# Patient Record
Sex: Female | Born: 1965 | Race: White | Hispanic: No | Marital: Single | Smoking: Former smoker
Health system: Southern US, Community
[De-identification: ages and names within clinical notes are randomized; demographics above are authoritative.]

## PROBLEM LIST (undated history)

## (undated) DIAGNOSIS — E559 Vitamin D deficiency, unspecified: Secondary | ICD-10-CM

## (undated) DIAGNOSIS — Z5189 Encounter for other specified aftercare: Secondary | ICD-10-CM

## (undated) DIAGNOSIS — K7581 Nonalcoholic steatohepatitis (NASH): Secondary | ICD-10-CM

## (undated) DIAGNOSIS — F329 Major depressive disorder, single episode, unspecified: Secondary | ICD-10-CM

## (undated) DIAGNOSIS — E039 Hypothyroidism, unspecified: Secondary | ICD-10-CM

## (undated) DIAGNOSIS — T7840XA Allergy, unspecified, initial encounter: Secondary | ICD-10-CM

## (undated) DIAGNOSIS — G4730 Sleep apnea, unspecified: Secondary | ICD-10-CM

## (undated) DIAGNOSIS — J45909 Unspecified asthma, uncomplicated: Secondary | ICD-10-CM

## (undated) DIAGNOSIS — R7401 Elevation of levels of liver transaminase levels: Secondary | ICD-10-CM

## (undated) DIAGNOSIS — T8859XA Other complications of anesthesia, initial encounter: Secondary | ICD-10-CM

## (undated) DIAGNOSIS — F32A Depression, unspecified: Secondary | ICD-10-CM

## (undated) DIAGNOSIS — N959 Unspecified menopausal and perimenopausal disorder: Secondary | ICD-10-CM

## (undated) DIAGNOSIS — I10 Essential (primary) hypertension: Secondary | ICD-10-CM

## (undated) DIAGNOSIS — E669 Obesity, unspecified: Secondary | ICD-10-CM

## (undated) DIAGNOSIS — E66811 Obesity, class 1: Secondary | ICD-10-CM

## (undated) DIAGNOSIS — E785 Hyperlipidemia, unspecified: Secondary | ICD-10-CM

## (undated) DIAGNOSIS — K635 Polyp of colon: Secondary | ICD-10-CM

## (undated) DIAGNOSIS — K219 Gastro-esophageal reflux disease without esophagitis: Secondary | ICD-10-CM

## (undated) DIAGNOSIS — E119 Type 2 diabetes mellitus without complications: Secondary | ICD-10-CM

## (undated) DIAGNOSIS — IMO0001 Reserved for inherently not codable concepts without codable children: Secondary | ICD-10-CM

## (undated) DIAGNOSIS — E876 Hypokalemia: Secondary | ICD-10-CM

## (undated) DIAGNOSIS — M545 Low back pain, unspecified: Secondary | ICD-10-CM

## (undated) DIAGNOSIS — G4733 Obstructive sleep apnea (adult) (pediatric): Secondary | ICD-10-CM

## (undated) DIAGNOSIS — E1169 Type 2 diabetes mellitus with other specified complication: Secondary | ICD-10-CM

## (undated) DIAGNOSIS — I152 Hypertension secondary to endocrine disorders: Secondary | ICD-10-CM

## (undated) DIAGNOSIS — M47814 Spondylosis without myelopathy or radiculopathy, thoracic region: Secondary | ICD-10-CM

## (undated) DIAGNOSIS — N3000 Acute cystitis without hematuria: Secondary | ICD-10-CM

## (undated) DIAGNOSIS — F3340 Major depressive disorder, recurrent, in remission, unspecified: Secondary | ICD-10-CM

## (undated) DIAGNOSIS — M791 Myalgia, unspecified site: Secondary | ICD-10-CM

## (undated) DIAGNOSIS — E1159 Type 2 diabetes mellitus with other circulatory complications: Secondary | ICD-10-CM

## (undated) DIAGNOSIS — E034 Atrophy of thyroid (acquired): Secondary | ICD-10-CM

## (undated) DIAGNOSIS — F419 Anxiety disorder, unspecified: Secondary | ICD-10-CM

## (undated) DIAGNOSIS — E0500 Thyrotoxicosis with diffuse goiter without thyrotoxic crisis or storm: Secondary | ICD-10-CM

## (undated) HISTORY — PX: APPENDECTOMY: SHX54

## (undated) HISTORY — DX: Obesity, class 1: E66.811

## (undated) HISTORY — DX: Depression, unspecified: F32.A

## (undated) HISTORY — PX: UPPER GASTROINTESTINAL ENDOSCOPY: SHX188

## (undated) HISTORY — PX: CHOLECYSTECTOMY: SHX55

## (undated) HISTORY — DX: Elevation of levels of liver transaminase levels: R74.01

## (undated) HISTORY — DX: Obesity, unspecified: E66.9

## (undated) HISTORY — PX: TONSILLECTOMY: SUR1361

## (undated) HISTORY — DX: Allergy, unspecified, initial encounter: T78.40XA

## (undated) HISTORY — PX: HYSTERECTOMY ABDOMINAL WITH SALPINGECTOMY: SHX6725

## (undated) HISTORY — DX: Hyperlipidemia, unspecified: E78.5

## (undated) HISTORY — PX: BREAST REDUCTION SURGERY: SHX8

## (undated) HISTORY — DX: Hypothyroidism, unspecified: E03.9

## (undated) HISTORY — DX: Essential (primary) hypertension: I10

## (undated) HISTORY — DX: Encounter for other specified aftercare: Z51.89

## (undated) HISTORY — DX: Sleep apnea, unspecified: G47.30

## (undated) HISTORY — PX: ADRENALECTOMY: SHX876

## (undated) HISTORY — DX: Polyp of colon: K63.5

## (undated) HISTORY — DX: Gastro-esophageal reflux disease without esophagitis: K21.9

## (undated) HISTORY — DX: Nonalcoholic steatohepatitis (NASH): K75.81

## (undated) HISTORY — DX: Vitamin D deficiency, unspecified: E55.9

## (undated) HISTORY — PX: LIVER BIOPSY: SHX301

## (undated) HISTORY — DX: Unspecified asthma, uncomplicated: J45.909

## (undated) HISTORY — DX: Type 2 diabetes mellitus without complications: E11.9

## (undated) HISTORY — DX: Unspecified menopausal and perimenopausal disorder: N95.9

## (undated) MED ORDER — LISINOPRIL 5 MG TAB
5 mg | ORAL_TABLET | Freq: Every day | ORAL | Status: DC
Start: ? — End: 2011-12-25

## (undated) MED ORDER — METFORMIN 500 MG TAB
500 mg | ORAL_TABLET | Freq: Two times a day (BID) | ORAL | Status: DC
Start: ? — End: 2011-12-25

## (undated) MED ORDER — TRIMETHOPRIM-SULFAMETHOXAZOLE 160 MG-800 MG TAB
160-800 mg | ORAL_TABLET | Freq: Two times a day (BID) | ORAL | Status: DC
Start: ? — End: 2011-10-02

## (undated) MED ORDER — ATORVASTATIN 40 MG TAB
40 mg | ORAL_TABLET | Freq: Every day | ORAL | Status: DC
Start: ? — End: 2013-01-20

## (undated) MED ORDER — CIPROFLOXACIN 500 MG TAB
500 mg | ORAL_TABLET | Freq: Three times a day (TID) | ORAL | Status: AC
Start: ? — End: 2012-06-13

## (undated) MED ORDER — ATORVASTATIN 40 MG TAB
40 mg | ORAL_TABLET | Freq: Every day | ORAL | Status: DC
Start: ? — End: 2013-07-31

## (undated) MED ORDER — CYCLOBENZAPRINE 10 MG TAB
10 mg | ORAL_TABLET | Freq: Three times a day (TID) | ORAL | Status: AC | PRN
Start: ? — End: 2013-01-10

## (undated) MED ORDER — CYCLOBENZAPRINE 10 MG TAB
10 mg | ORAL_TABLET | Freq: Three times a day (TID) | ORAL | Status: DC | PRN
Start: ? — End: 2012-12-11

## (undated) MED ORDER — LEVOTHYROXINE 88 MCG TAB
88 mcg | ORAL_TABLET | Freq: Every day | ORAL | Status: DC
Start: ? — End: 2012-03-10

## (undated) MED ORDER — LEVOTHYROXINE 88 MCG TAB
88 mcg | ORAL_TABLET | Freq: Every day | ORAL | Status: DC
Start: ? — End: 2012-03-11

## (undated) MED ORDER — TRIMETHOPRIM-SULFAMETHOXAZOLE 160 MG-800 MG TAB
160-800 mg | ORAL_TABLET | Freq: Two times a day (BID) | ORAL | Status: AC
Start: ? — End: 2011-10-05

## (undated) MED ORDER — CIPROFLOXACIN 500 MG TAB
500 mg | ORAL_TABLET | Freq: Two times a day (BID) | ORAL | Status: AC
Start: ? — End: 2012-06-16

## (undated) MED ORDER — CIPROFLOXACIN 500 MG TAB
500 mg | ORAL_TABLET | Freq: Three times a day (TID) | ORAL | Status: DC
Start: ? — End: 2012-06-06

## (undated) MED ORDER — ATORVASTATIN 40 MG TAB
40 mg | ORAL_TABLET | Freq: Every day | ORAL | Status: DC
Start: ? — End: 2012-11-26

---
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
## 2004-11-14 LAB — HM MAMMOGRAPHY: Mammography, External: NEGATIVE

## 2007-03-05 LAB — URINALYSIS W/ REFLEX CULTURE
Bacteria: NEGATIVE /HPF
Bilirubin: NEGATIVE
Blood: NEGATIVE
Glucose: NEGATIVE MG/DL
Ketone: NEGATIVE MG/DL
Leukocyte Esterase: NEGATIVE
Nitrites: NEGATIVE
Protein: NEGATIVE MG/DL
Specific gravity: 1.011 (ref 1.003–1.030)
Urobilinogen: 0.2 EU/DL (ref 0.2–1.0)
pH (UA): 7 (ref 5.0–8.0)

## 2007-03-06 LAB — MICROALBUMIN, UR, RAND W/ MICROALB/CREAT RATIO
Creatinine, urine random: 38.9 MG/DL (ref 30–125)
Microalbumin,urine random: 1.58 MG/DL
Microalbumin/Creat ratio (mg/g creat): 41 mg/g — ABNORMAL HIGH (ref 0–30)

## 2007-04-15 LAB — METABOLIC PANEL, COMPREHENSIVE
A-G Ratio: 1.4 (ref 1.1–2.2)
ALT (SGPT): 38 U/L (ref 30–65)
AST (SGOT): 15 U/L (ref 15–37)
Albumin: 3.9 g/dL (ref 3.5–5.0)
Alk. phosphatase: 80 U/L (ref 50–136)
Anion gap: 8 mmol/L (ref 5–15)
BUN/Creatinine ratio: 20 (ref 12–20)
BUN: 12 MG/DL (ref 6–20)
Bilirubin, total: 0.4 MG/DL (ref <1.0)
CO2: 30 MMOL/L (ref 21–32)
Calcium: 9.1 MG/DL (ref 8.5–10.1)
Chloride: 101 MMOL/L (ref 97–108)
Creatinine: 0.6 MG/DL (ref 0.6–1.3)
GFR est AA: >60 mL/min/{1.73_m2} (ref >60)
GFR est non-AA: >60 mL/min/{1.73_m2} (ref >60)
Globulin: 2.8 g/dL (ref 2.0–4.0)
Glucose: 95 MG/DL (ref 50–100)
Potassium: 3.7 MMOL/L (ref 3.5–5.1)
Protein, total: 6.7 g/dL (ref 6.4–8.2)
Sodium: 139 MMOL/L (ref 136–145)

## 2007-04-15 LAB — LIPID PANEL
CHOL/HDL Ratio: 3.9 (ref 0–5.0)
Cholesterol, total: 171 MG/DL (ref <200)
HDL Cholesterol: 44 MG/DL (ref 40–60)
LDL, calculated: 103.8 MG/DL — ABNORMAL HIGH (ref 0–100)
Triglyceride: 116 MG/DL (ref 30–200)
VLDL, calculated: 23.2 MG/DL

## 2007-04-16 LAB — HEMOGLOBIN A1C WITH EAG: Hemoglobin A1c: 5.9 % — ABNORMAL HIGH (ref 4.2–5.8)

## 2007-07-16 LAB — METABOLIC PANEL, BASIC
Anion gap: 14 mmol/L (ref 5–15)
BUN/Creatinine ratio: 18 (ref 12–20)
BUN: 11 mg/dL (ref 6–20)
CO2: 28 MMOL/L (ref 21–32)
Calcium: 9.6 MG/DL (ref 8.5–10.1)
Chloride: 101 MMOL/L (ref 97–108)
Creatinine: 0.6 mg/dL (ref 0.6–1.3)
GFR est AA: >60 mL/min/{1.73_m2} (ref >60)
GFR est non-AA: >60 mL/min/{1.73_m2} (ref >60)
Glucose: 83 MG/DL (ref 50–100)
Potassium: 4.2 MMOL/L (ref 3.5–5.1)
Sodium: 143 MMOL/L (ref 136–145)

## 2007-07-16 LAB — HEMOGLOBIN A1C WITH EAG: Hemoglobin A1c: 5.9 % — ABNORMAL HIGH (ref 4.2–5.8)

## 2007-07-16 LAB — TSH 3RD GENERATION: TSH: 1.07 u[IU]/mL (ref 0.35–5.5)

## 2007-07-25 DIAGNOSIS — K760 Fatty (change of) liver, not elsewhere classified: Secondary | ICD-10-CM | POA: Insufficient documentation

## 2007-12-19 LAB — URINALYSIS W/MICROSCOPIC
Bacteria: NEGATIVE /HPF
Bilirubin: NEGATIVE
Blood: NEGATIVE
Glucose: NEGATIVE MG/DL
Ketone: NEGATIVE MG/DL
Nitrites: NEGATIVE
Protein: NEGATIVE MG/DL
Specific gravity: 1.025 (ref 1.003–1.030)
Urobilinogen: 1 EU/DL (ref 0.2–1.0)
pH (UA): 6.5 (ref 5.0–8.0)

## 2007-12-19 LAB — METABOLIC PANEL, BASIC
Anion gap: 5 mmol/L (ref 5–15)
BUN/Creatinine ratio: 25 — ABNORMAL HIGH (ref 12–20)
BUN: 15 MG/DL (ref 6–20)
CO2: 30 MMOL/L (ref 21–32)
Calcium: 9.5 MG/DL (ref 8.5–10.1)
Chloride: 106 MMOL/L (ref 97–108)
Creatinine: 0.6 MG/DL (ref 0.6–1.3)
GFR est AA: >60 mL/min/{1.73_m2} (ref >60)
GFR est non-AA: >60 mL/min/{1.73_m2} (ref >60)
Glucose: 105 MG/DL — ABNORMAL HIGH (ref 50–100)
Potassium: 4.6 MMOL/L (ref 3.5–5.1)
Sodium: 141 MMOL/L (ref 136–145)

## 2007-12-19 LAB — MICROALBUMIN, UR, RAND W/ MICROALB/CREAT RATIO
Creatinine, urine random: 115.9 MG/DL (ref 30.0–125.0)
Microalbumin,urine random: 2.47 MG/DL
Microalbumin/Creat ratio (mg/g creat): 21 mg/g (ref 0–30)

## 2007-12-19 MED ORDER — ZOLPIDEM 10 MG TAB
10 mg | ORAL_TABLET | Freq: Every evening | ORAL | Status: DC | PRN
Start: 2007-12-19 — End: 2008-07-13

## 2007-12-19 NOTE — Progress Notes (Signed)
Katrina [name] is a 42 y.o. female here for a follow up visit.  She was previously followed by Dr. Darcella [name].    1.  Hypertension:  The patient has been compliant with medications.  There have been no known side effects.  The patient denies chest pain, dyspnea, lower extremity swelling.  Her blood pressure at night is usually 127-130s/80s.  She usually goes to the gym, but not recently.      2.  Hyperlipidemia:  She has not wanted to add a statin despite the fact that her cholesterol has been elevated in the past.  She did see Dr. Rose [name] for her fatty liver disease in November.  Her total cholesterol was 170 at the time.      3.  Depression:  She has been on Prozac, Wellbutrin, Paxil in the past.  The Lexapro at 20 mg has worked for quite some time.  She does notice some increased emotionality just before her period, but otherwise is doing better.        4.  Hypothyroidism:  The patient reports compliance with medication.  No hot/cold intolerance.  No change in bowel habits.  No palpitations or fatigue.  TSH was checked in July.      5.  Diabetes:  The patient's blood sugars at home have been very good; she hasn't checked recently.  No hyper- or hypoglycemic symptoms.  No numbness or tingling.  Reports compliance with diet and medications.         Current outpatient prescriptions   Medication Sig   ??? escitalopram (LEXAPRO) 20 mg tablet take 20 mg by mouth daily.   ??? metformin (GLUCOPHAGE) 500 mg tablet take  by mouth two (2) times daily (with meals). 2 tabs bid    ??? levothyroxine (LEVOXYL) 88 mcg tablet take  by mouth daily.   ??? LANSOPRAZOLE (PREVACID PO) take  by mouth.   ??? ursodiol (ACTIGALL) 300 mg capsule take 300 mg by mouth two (2) times a day. Two tabs bid    ??? NEBIVOLOL HCL (BYSTOLIC PO) take 10 mg by mouth daily.   ??? DOCOSAHEXANOIC ACID/EPA (FISH OIL PO) take 1,000 mg by mouth two (2) times a day. 2 tabs bid   ??? PV W-O CAL/FERROUS FUMARATE/FA (M-VIT PO) take  by mouth.    ??? FERROUS FUMARATE/VIT BCOMP&C (SUPER B COMPLEX PO) take  by mouth.   ??? hydrochlorothiazide (HYDRODIURIL) 25 mg tablet take 25 mg by mouth daily.        Lotrel 10/40 mg one po daily.        Allergies   Allergen Reactions   ??? Codeine Rash and Itching         Physical exam:    BP 134/84   Pulse 76   Ht 5\' 5"  (1.651 m)   Wt 192 lb 3.2 oz (87.181 kg)    Gen:  The patient is well-developed, well-nourished, and in no distress.  Heart:  Regular rhythm, normal rate, no murmur, gallop or rub noted  Lungs:  Clear to ascultation bilaterally, without wheezes or rales.  Full, symmetric expansion bilaterally.  Vascular:  Distal pulses 2+ and symmetric bilateral upper and lower extremities.  No peripheral edema noted      Results for Katrina [name], Katrina [name] (MRN [id number]) as of 12/19/2007 10:07   Ref. Range 07/16/2007 09:50   Sodium Latest Range: 136-145 MMOL/L 143   Potassium Latest Range: 3.5-5.1 MMOL/L 4.2   Chloride Latest Range: 97-108 MMOL/L 101   CO2 Latest Range: 21-32  MMOL/L 28   Anion gap Latest Range: 5-15 mmol/L 14   Glucose Latest Range: 50-100 MG/DL 83   BUN Latest Range: 6-20 mg/dL 11   Creatinine Latest Range: 0.6-1.3 mg/dL 0.6   BUN/Creatinine ratio Latest Range: 12-20   18   Calcium Latest Range: 8.5-10.1 MG/DL 9.6   GFR est AA Latest Range: >60 ml/min/1.49m2 >60   GFR est non-AA Latest Range: >60 ml/min/1.14m2 >60   Hemoglobin A1C Latest Range: 4.2-5.8 % 5.9 (H)   TSH, 3rd generation Latest Range: 0.35-5.5 UIU/ML 1.07         Results for Katrina [name], Katrina [name] (MRN [id number]) as of 12/19/2007 10:07   Ref. Range 04/15/2007 09:25   Cholesterol, total Latest Range: <200 MG/DL 536   HDL Cholesterol Latest Range: 40-60 MG/DL 44   CHOL/HDL Ratio Latest Range: 0-5.0   3.9   LDL, calculated Latest Range: 0-100 MG/DL 644.0 (H)   VLDL, calculated No range found 23.2       Assessment and Plan:    - Diabetes mellitus--Previously very well controlled.  Continue diabetic diet, metformin.  - Hemoglobin a1c -- Future   - Microalbumin, ur, rand -- Future  - Urinalysis w/microscopic -- Future  - Continue ACEI    - Hyperlipidemia--Improved by patient report.   - She will have her labs sent from Dr. Luna [name] office.  - Continue lifestyle modifications.    - Hypothyroidism--Clinically euthyroid.  - Continue current dose Levoxyl.    - Essential hypertension, benign--blood pressure is very close go goal.  - Metabolic panel, basic -- Future  - Urinalysis w/microscopic -- Future  - Continue current medications  - Patient is asked to monitor BP at home or work, several times per month and return with written values at next office visit.   - Repeat blood pressure in three months.    - Depression   - Stable.  Continue Lexapro.    - Fatty liver--Followed by Dr. 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  Continue Lexapro.    - Zolpidem 10 mg tab -- Take 1 Tab by mouth nightly as needed for Sleep.    Follow-up Disposition:  Return in about 3 months (around 03/18/2008).

## 2007-12-19 NOTE — Patient Instructions (Signed)
Patient is asked to monitor BP at home or work, several times per month and return with written values at next office visit.

## 2007-12-19 NOTE — Progress Notes (Signed)
Quick Note:    Please call the patient: Her urine looks like it may be infected--if symptoms, we should call in an antibiotic--nitrofurantoin 100 mg po bid x 5 days, #10, no refills. Her urine looks okay, as far as protein. Other labs look good. (A1c is pending--we will send a letter.)    ______

## 2007-12-20 LAB — HEMOGLOBIN A1C WITH EAG: Hemoglobin A1c: 5.8 % (ref 4.2–5.8)

## 2007-12-23 NOTE — Progress Notes (Signed)
Quick Note:    Spoke with patient and called abx to u/k. [phone] also mailed copies of labs to patient per her request  ______

## 2008-03-19 ENCOUNTER — Ambulatory Visit

## 2008-03-19 LAB — METABOLIC PANEL, BASIC
Anion gap: 7 mmol/L (ref 5–15)
BUN/Creatinine ratio: 24 — ABNORMAL HIGH (ref 12–20)
BUN: 17 MG/DL (ref 6–20)
CO2: 32 MMOL/L (ref 21–32)
Calcium: 9.4 MG/DL (ref 8.5–10.1)
Chloride: 104 MMOL/L (ref 97–108)
Creatinine: 0.7 MG/DL (ref 0.6–1.3)
GFR est AA: >60 mL/min/{1.73_m2} (ref >60)
GFR est non-AA: >60 mL/min/{1.73_m2} (ref >60)
Glucose: 101 MG/DL — ABNORMAL HIGH (ref 50–100)
Potassium: 3.2 MMOL/L — ABNORMAL LOW (ref 3.5–5.1)
Sodium: 143 MMOL/L (ref 136–145)

## 2008-03-19 LAB — HEMOGLOBIN A1C WITH EAG: Hemoglobin A1c: 5.9 % — ABNORMAL HIGH (ref 4.2–5.8)

## 2008-03-19 MED ORDER — ALPRAZOLAM 0.5 MG TAB
0.5 mg | ORAL_TABLET | Freq: Every evening | ORAL | Status: DC | PRN
Start: 2008-03-19 — End: 2008-05-10

## 2008-03-19 NOTE — Telephone Encounter (Signed)
S/w pt and relayed message as per Dr. Maurine Minister.  Advised pt I will be sending her list of high potassium foods.  Pt acknowledged understanding of all.

## 2008-03-19 NOTE — Patient Instructions (Signed)
Thank you for enrolling in MyChart. Please follow the instructions below to securely access your online medical record. MyChart allows you to send messages to your doctor, view your test results, renew your prescriptions, schedule appointments, and more.    How Do I Sign Up?    1. In your internet browser, go to [email or web address].  2. Click on the Sign Up Now link in the Sign In box. You will see the New Member Sign Up page.  3. Enter your MyChart Access Code exactly as it appears below. You will not need to use this code after you???ve completed the sign-up process. If you do not sign up before the expiration date, you must request a new code.    MyChart Access Code: [email or web address]  Expires: 05/18/08 09:38 AM     4. Enter the last four digits of your Social Security Number (xxxx) and Date of Birth (mm/dd/yyyy) as indicated and click Submit. You will be taken to the next sign-up page.  5. Create a MyChart ID. This will be your MyChart login ID and cannot be changed, so think of one that is secure and easy to remember.  6. Create a MyChart password. You can change your password at any time.  7. Enter your Password Reset Question and Answer. This can be used at a later time if you forget your password.   8. Enter your e-mail address. You will receive e-mail notification when new information is available in MyChart.  9. Click Sign Up. You can now view your medical record.    Additional Information    If you have questions, you can email [email or web address]. Remember, MyChart is NOT to be used for urgent needs. For medical emergencies, dial 911.      Talk to Dr. Rose Fillers before starting alprazolam.  And about getting lipid panel sent here.

## 2008-03-19 NOTE — Telephone Encounter (Signed)
Message copied by Fernanda Drum on Fri Mar 19, 2008  3:25 PM  ------       Message from: Shirlean Schlein, D       Created: Fri Mar 19, 2008  1:42 PM         Please call the patient: Her potassium is slightly low today.  She should eat high potassium foods and have it re-checked in a week or two.  ( please mail her a potassium diet).

## 2008-03-19 NOTE — Progress Notes (Signed)
Med check/refills.

## 2008-03-19 NOTE — Progress Notes (Signed)
HISTORY OF PRESENT ILLNESS  Katrina [name] is a 43 y.o. female here for a follow up visit.    HPI 1. Hypertension:  The patient has been compliant with medications.  There have been no known side effects.  The patient denies chest pain, dyspnea.  Her blood pressures have been around 127-130/82-90 at home.  Her blood pressures seem to be at the upper end of the range first thing in the morning, before he medicine.  She is exercising, running and doing pilates.        2.  Diabetes:  The patient's blood sugars at home have been good.  No hyper- or hypoglycemic symptoms.  No numbness or tingling.  Reports compliance with diet and medications.  Her labs are below.    3.  Depression:  She does not take her Ambien every night, and on the nights she doesn't take it she can't relax, can't stop thinking.  She feels her depression is controlled with Lexapro, except right before her period.  She is more on edge, emotional at that time. Stress at work is high right now.      Current outpatient prescriptions   Medication Sig   ??? amlodipine-benazepril (LOTREL) 10-40 mg per capsule Take 1 Cap by mouth daily.   ??? zolpidem (AMBIEN) 10 mg tablet Take 1 Tab by mouth nightly as needed for Sleep.   ??? escitalopram (LEXAPRO) 20 mg tablet take 20 mg by mouth daily.   ??? metformin (GLUCOPHAGE) 500 mg tablet take  by mouth two (2) times daily (with meals). 2 tabs bid    ??? levothyroxine (LEVOXYL) 88 mcg tablet take  by mouth daily.   ??? LANSOPRAZOLE (PREVACID PO) take  by mouth.   ??? ursodiol (ACTIGALL) 300 mg capsule take 300 mg by mouth two (2) times a day. Two tabs bid    ??? NEBIVOLOL HCL (BYSTOLIC PO) take 10 mg by mouth daily.   ??? DOCOSAHEXANOIC ACID/EPA (FISH OIL PO) take 1,000 mg by mouth two (2) times a day. 2 tabs bid   ??? PV W-O CAL/FERROUS FUMARATE/FA (M-VIT PO) take  by mouth.   ??? FERROUS FUMARATE/VIT BCOMP&C (SUPER B COMPLEX PO) take  by mouth.   ??? hydrochlorothiazide (HYDRODIURIL) 25 mg tablet take 25 mg by mouth daily.           Allergies   Allergen Reactions   ??? Codeine Rash and Itching          ROS see HPI      Filed Vitals:    03/19/2008  9:07 AM 03/19/2008  9:36 AM   BP: 160/90 160/82   Pulse: 92    Weight: 195 lb (88.451 kg)           Physical Exam   Nursing note and vitals reviewed.  Constitutional: She appears well-developed and well-nourished. No distress.   Cardiovascular: Normal rate, regular rhythm, normal heart sounds and intact distal pulses.  Exam reveals no gallop and no friction rub.    No murmur heard.  Pulses:       Radial pulses are 2+ on the right side, and 2+ on the left side.        Dorsalis pedis pulses are 2+ on the right side, and 2+ on the left side.        Posterior tibial pulses are 2+ on the right side, and 2+ on the left side.        No peripheral edema noted   Pulmonary/Chest: Effort normal and  breath sounds normal. She has no wheezes. She has no rales.         Results for Katrina [name], Katrina [name] (MRN [id number]) as of 03/19/2008 09:26   Ref. Range 12/19/2007 10:47   Color No range found YELLOW   Appearance No range found CLEAR   pH Latest Range: 5.0-8.0   6.5   Protein Latest Range: NEGATIVE MG/DL NEGATIVE   Glucose Latest Range: NEGATIVE MG/DL NEGATIVE   Ketone Latest Range: NEGATIVE MG/DL NEGATIVE   Blood Latest Range: NEGATIVE  NEGATIVE   Bilirubin Latest Range: NEGATIVE  NEGATIVE   Urobilinogen Latest Range: 0.2-1.0 EU/DL 1.0   Nitrites Latest Range: NEGATIVE  NEGATIVE   Leukocyte Esterase Latest Range: NEGATIVE  MODERATE (A)   WBC Latest Range: 0-4 /HPF 10-20   RBC Latest Range: 0-5 /HPF 0-3   Bacteria Latest Range: NEGATIVE /HPF NEGATIVE   Hyaline Cast Latest Range: 0-2  0-2   Epithelial cells Latest Range: 0-5 /LPF 5-10   Sodium Latest Range: 136-145 MMOL/L 141   Potassium Latest Range: 3.5-5.1 MMOL/L 4.6   Chloride Latest Range: 97-108 MMOL/L 106   CO2 Latest Range: 21-32 MMOL/L 30   Anion gap Latest Range: 5-15 mmol/L 5   Glucose Latest Range: 50-100 MG/DL 045 (H)   BUN Latest Range: 6-20 MG/DL 15    Creatinine Latest Range: 0.6-1.3 MG/DL 0.6   BUN/Creatinine ratio Latest Range: 12-20   25 (H)   Calcium Latest Range: 8.5-10.1 MG/DL 9.5   GFR est AA Latest Range: >60 ml/min/1.82m2 >60   GFR est non-AA Latest Range: >60 ml/min/1.40m2 >60   Hemoglobin A1C Latest Range: 4.2-5.8 % 5.8   Creatinine,urine random Latest Range: 30.0-125.0 MG/DL 409.8   Microalbumin,urine random No range found 2.47   Microalbumin/Creat ratio (mg/g creat) Latest Range: 0-30 mg/g 21         ASSESSMENT and PLAN    Essential hypertension, benign--Her blood pressure has been controlled before.  Her elevated blood pressures in the morning may suggest she has a non-dipping pattern of her blood pressure.  Her medication may be wearing off by this time as well.  I suspect her blood pressure has been increased because of anxiety, though.  Will treat her anxiety, as below, and have her follow up.  She was asked to monitor her blood pressures at home, as well.  - METABOLIC PANEL, BASIC; Future  - [facility] MYCHART BP FLOWSHEET    Diabetes mellitus--Her A1C is at goal. I still do not know her cholesterol values.  She will continue a diabetic diet and metformin.  She will be given a lab sheet for repeat A1c today.  She will call Dr. Luna Kitchens office again to have her recent lipid panel faxed. Continue benazepril.   - HEMOGLOBIN A1C; Future  - METABOLIC PANEL, BASIC; Future    Elevated triglycerides with high cholesterol--Not on medication.  She says these values were just checked.  Again, we will review the records from Dr. Rose Fillers.      Anxiety--Her depression is controlled with Lexapro, but she has some recent anxiety symptoms.  I suspect she will do well with some alprazolam 0.5 mg prn for a short time.  She will ask Dr. Rose Fillers if it is safe with regard to her liver function first.   - alprazolam Prudy Feeler) tablet; Take 1 Tab by mouth nightly as needed for Sleep and Anxiety.        ? Cholesterol

## 2008-03-19 NOTE — Progress Notes (Signed)
Quick Note:    Please call the patient: Her potassium is slightly low today. She should eat high potassium foods and have it re-checked in a week or two. ( please mail her a potassium diet).  ______

## 2008-03-28 ENCOUNTER — Encounter

## 2008-03-31 ENCOUNTER — Ambulatory Visit

## 2008-03-31 LAB — POTASSIUM: Potassium: 4 MMOL/L (ref 3.5–5.1)

## 2008-03-31 NOTE — Progress Notes (Signed)
Quick Note:    Please call the patient: your potassium is normal now. It was probably just a transient change, or lab error. Please follow up in about a month to discuss your blood pressure and anxiety.  ______

## 2008-03-31 NOTE — Telephone Encounter (Signed)
Spoke with patient

## 2008-03-31 NOTE — Telephone Encounter (Signed)
Message copied by Denton Meek on Wed Mar 31, 2008 12:47 PM  ------       Message from: Shirlean Schlein, D       Created: Wed Mar 31, 2008 11:34 AM         Please call the patient: your potassium is normal now. It was probably just a transient change, or lab error.  Please follow up in about a month to discuss your blood pressure and anxiety.

## 2008-03-31 NOTE — Telephone Encounter (Signed)
Message copied by Denton Meek on Wed Mar 31, 2008 12:48 PM  ------       Message from: Shirlean Schlein, D       Created: Wed Mar 31, 2008 11:34 AM         Please call the patient: your potassium is normal now. It was probably just a transient change, or lab error.  Please follow up in about a month to discuss your blood pressure and anxiety.

## 2008-04-02 MED ORDER — ESCITALOPRAM 20 MG TAB
20 mg | ORAL_TABLET | Freq: Every day | ORAL | Status: DC
Start: 2008-04-02 — End: 2008-11-02

## 2008-05-10 MED ORDER — ALPRAZOLAM 0.5 MG TAB
0.5 mg | ORAL_TABLET | Freq: Every evening | ORAL | Status: DC | PRN
Start: 2008-05-10 — End: 2008-07-13

## 2008-05-10 MED ORDER — ALPRAZOLAM 0.5 MG TAB
0.5 mg | ORAL_TABLET | Freq: Every evening | ORAL | Status: DC | PRN
Start: 2008-05-10 — End: 2008-11-02

## 2008-05-10 NOTE — Progress Notes (Signed)
HISTORY OF PRESENT ILLNESS  Katrina [name] is a 43 y.o. female here for a follow up visit.     HPI 1.  Hypertension:  The patient has been compliant with medications.  There have been no known side effects.  Her blood pressure at home has been much lower--she has even gotten some that are low 100s over 60.  The patient denies chest pain, dyspnea, lower extremity swelling; there has been some dizziness with quick position changes.  She has been exercising four days per week and has lost eight pounds.      2.  Anxiety:  She has stopped taking her Ambien.  She noticed the Xanax at bedtime relaxes her immediately. She is able to get to sleep and sleep through the night. During the day she even feels better.  She reports compliance with her Lexapro.         Current outpatient prescriptions   Medication Sig   ??? escitalopram (LEXAPRO) 20 mg tablet Take 1 Tab by mouth daily.   ??? alprazolam (XANAX) 0.5 mg tablet Take 1 Tab by mouth nightly as needed for Sleep and Anxiety.   ??? amlodipine-benazepril (LOTREL) 10-40 mg per capsule Take 1 Cap by mouth daily.   ??? zolpidem (AMBIEN) 10 mg tablet Take 1 Tab by mouth nightly as needed for Sleep.   ??? metformin (GLUCOPHAGE) 500 mg tablet take  by mouth two (2) times daily (with meals). 2 tabs bid    ??? levothyroxine (LEVOXYL) 88 mcg tablet take  by mouth daily.   ??? LANSOPRAZOLE (PREVACID PO) take  by mouth.   ??? ursodiol (ACTIGALL) 300 mg capsule take 300 mg by mouth two (2) times a day. Two tabs bid    ??? NEBIVOLOL HCL (BYSTOLIC PO) take 10 mg by mouth daily.   ??? DOCOSAHEXANOIC ACID/EPA (FISH OIL PO) take 1,000 mg by mouth two (2) times a day. 2 tabs bid   ??? PV W-O CAL/FERROUS FUMARATE/FA (M-VIT PO) take  by mouth.   ??? FERROUS FUMARATE/VIT BCOMP&C (SUPER B COMPLEX PO) take  by mouth.   ??? hydrochlorothiazide (HYDRODIURIL) 25 mg tablet take 25 mg by mouth daily.        Allergies   Allergen Reactions   ??? Codeine Rash and Itching          ROS see HPI     BP 122/80   Pulse 58   Wt 188 lb 12.8 oz (85.639 kg)     Physical Exam   Nursing note and vitals reviewed.  Constitutional: She appears well-developed and well-nourished. No distress.   Cardiovascular: Normal rate, regular rhythm, normal heart sounds and intact distal pulses.  Exam reveals no gallop and no friction rub.    No murmur heard.  Pulses:       Radial pulses are 2+ on the right side, and 2+ on the left side.        Dorsalis pedis pulses are 2+ on the right side, and 2+ on the left side.        Posterior tibial pulses are 2+ on the right side, and 2+ on the left side.        No peripheral edema noted   Pulmonary/Chest: Effort normal and breath sounds normal. She has no wheezes. She has no rales.   Psychiatric: She has a normal mood and affect. Her behavior is normal. Thought content normal.       ASSESSMENT and PLAN      Essential hypertension, benign--Improved. She  has still had some blood pressures above goal for diabetes, but it has greatly improved with treating her anxiety. I will ask her to stay on her current medications and continue monitoring her blood pressures at home.  As she loses weight and continues exercising, she may require less medication for control.   - MyChart BP Flowsheet    Anxiety--Improved. She will continue her Lexapro and alprazolam at bedtime.    - alprazolam Prudy Feeler) tablet; Take 1 Tab by mouth nightly as needed for Sleep and Anxiety.      Follow-up Disposition:  Return in about 2 months (around 07/10/2008). for diabetes, lipids, blood pressure.

## 2008-07-13 ENCOUNTER — Ambulatory Visit

## 2008-07-13 ENCOUNTER — Encounter

## 2008-07-13 LAB — TSH 3RD GENERATION: TSH: 1.07 u[IU]/mL (ref 0.36–3.74)

## 2008-07-13 LAB — POTASSIUM: Potassium: 3.2 MMOL/L — ABNORMAL LOW (ref 3.5–5.1)

## 2008-07-13 MED ORDER — NEBIVOLOL 10 MG TAB
10 mg | ORAL_TABLET | Freq: Every day | ORAL | Status: DC
Start: 2008-07-13 — End: 2008-11-02

## 2008-07-13 MED ORDER — HYDROCHLOROTHIAZIDE 25 MG TAB
25 mg | ORAL_TABLET | Freq: Every day | ORAL | Status: DC
Start: 2008-07-13 — End: 2008-11-02

## 2008-07-13 MED ORDER — POTASSIUM CHLORIDE SR 20 MEQ TAB, PARTICLES/CRYSTALS
20 mEq | ORAL_TABLET | Freq: Every day | ORAL | Status: DC
Start: 2008-07-13 — End: 2009-03-04

## 2008-07-13 MED ORDER — LEVOTHYROXINE 88 MCG TAB
88 mcg | ORAL_TABLET | Freq: Every day | ORAL | Status: DC
Start: 2008-07-13 — End: 2008-11-02

## 2008-07-13 MED ORDER — METFORMIN 500 MG TAB
500 mg | ORAL_TABLET | Freq: Two times a day (BID) | ORAL | Status: DC
Start: 2008-07-13 — End: 2008-11-02

## 2008-07-13 MED ORDER — AMLODIPINE-BENAZEPRIL 10 MG-40 MG CAP
10-40 mg | ORAL_CAPSULE | Freq: Every day | ORAL | Status: DC
Start: 2008-07-13 — End: 2008-11-02

## 2008-07-13 NOTE — Patient Instructions (Signed)
Follow up me in three months--let me know if blood pressure is high before then

## 2008-07-13 NOTE — Progress Notes (Signed)
HISTORY OF PRESENT ILLNESS  Katrina [name] is a 43 y.o. female here for a follow up visit.     HPI 1. Diabetes:  The patient's blood sugars at home have been  No hyper- or hypoglycemic symptoms.  No numbness or tingling.  Reports compliance with diet and medications.  Her A1C in June was 5.7%.    2.  Hypertension:  The patient has been compliant with medications.  There have been no known side effects.  The patient denies chest pain, dyspnea, lower extremity swelling.  Her blood pressures have been 130 systolic at home.      3.  Hypothyroidism:  The patient reports compliance with medication.  No hot/cold intolerance.  No change in bowel habits.  No palpitations or fatigue.     4. Hyperlipidemia:  The patient has been working on weight loss, but admits her diet has not been very good lately.  She cut down on calories, but did not have a good balance of protein and carbohydrates that she had before.  She just had her cholesterol checked in June with Dr. Rose Fillers. LDL was 117, TGs 160, total was 185, HDL was 36.  She has never been on a statin, but before this, her LDL and overall cholesterol was better.    5.  Depression:  The alprazolam 1/2 or one pill at bedtime has helped with her sleep.  Her mood is good.      Current outpatient prescriptions   Medication Sig   ??? alprazolam (XANAX) 0.5 mg tablet Take 1 Tab by mouth nightly as needed for Sleep and Anxiety.   ??? escitalopram (LEXAPRO) 20 mg tablet Take 1 Tab by mouth daily.   ??? amlodipine-benazepril (LOTREL) 10-40 mg per capsule Take 1 Cap by mouth daily.   ??? metformin (GLUCOPHAGE) 500 mg tablet take  by mouth two (2) times daily (with meals). 2 tabs bid    ??? levothyroxine (LEVOXYL) 88 mcg tablet take  by mouth daily.   ??? ursodiol (ACTIGALL) 300 mg capsule take 300 mg by mouth two (2) times a day. Two tabs bid    ??? NEBIVOLOL HCL (BYSTOLIC PO) take 10 mg by mouth daily.    ??? DOCOSAHEXANOIC ACID/EPA (FISH OIL PO) take 1,000 mg by mouth two (2) times a day. 2 tabs bid   ??? PV W-O CAL/FERROUS FUMARATE/FA (M-VIT PO) take  by mouth.   ??? FERROUS FUMARATE/VIT BCOMP&C (SUPER B COMPLEX PO) take  by mouth.   ??? hydrochlorothiazide (HYDRODIURIL) 25 mg tablet take 25 mg by mouth daily.        Allergies   Allergen Reactions   ??? Codeine Rash and Itching          ROS see HPI      Filed Vitals:    07/13/2008  8:51 AM 07/13/2008  9:15 AM   BP: 138/90 140/72   Pulse: 62    Weight: 180 lb 12.8 oz (82.01 kg)           Physical Exam   Nursing note and vitals reviewed.  Constitutional: She is oriented to person, place, and time. She appears well-developed and well-nourished. No distress.   Cardiovascular: Normal rate, regular rhythm, normal heart sounds and intact distal pulses.  Exam reveals no gallop and no friction rub.    No murmur heard.  Pulses:       Radial pulses are 2+ on the right side, and 2+ on the left side.  Dorsalis pedis pulses are 2+ on the right side, and 2+ on the left side.        Posterior tibial pulses are 2+ on the right side, and 2+ on the left side.        No peripheral edema noted   Pulmonary/Chest: Effort normal and breath sounds normal. She has no wheezes. She has no rales.   Neurological: She is alert and oriented to person, place, and time.   Psychiatric: She has a normal mood and affect. Her behavior is normal. Thought content normal.       ASSESSMENT and PLAN    Diabetes mellitus--Well controlled, with an A1C of 5.7.  She will continue metformin, ARB.  I will consider starting a statin. Diabetic diet recommended.  Follow up in three months.  - metformin (GLUCOPHAGE) tablet; Take 2 Tabs by mouth two (2) times daily (with meals).     Essential hypertension, benign--Blood pressure controlled at home, and had been better at her last appointment.  I will ask her to continue monitoring and send her readings to me.  I will follow up on her borderline potassium level and consider adding a supplement while on hydrochlorothiazide.  - AMLODIPINE-BENAZEPRIL 10 MG-40 MG CAP; Take 1 Cap by mouth daily.  - hydrochlorothiazide (HYDRODIURIL) tablet; Take 1 Tab by mouth daily.  - nebivolol (BYSTOLIC) tablet; Take 1 Tab by mouth daily.  - POTASSIUM; Future    Hyperlipidemia--Not treated. She will continue exercising and work on her diet.  She has never been on a statin--there was possibly some concern about the fatty liver.  I will insist on a statin if it is still above goal in six months.    Hypothyroidism--Clinically euthyroid.   Will check labs and continue the current dose of medication pending results.   - TSH, 3RD GENERATION; Future  - levothyroxine (SYNTHROID) tablet; Take 1 Tab by mouth daily.    Depression--She is doing better.  Continue current medications.      Follow-up Disposition:  Return in about 3 months (around 10/13/2008).
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
## 2008-08-16 NOTE — Patient Instructions (Signed)
Try the muscle at bedtime, recumbent bike for another week, walking without hills is fine for now.
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
## 2008-11-02 ENCOUNTER — Ambulatory Visit

## 2008-11-02 LAB — METABOLIC PANEL, BASIC
Anion gap: 10 mmol/L (ref 5–15)
BUN/Creatinine ratio: 17 (ref 12–20)
BUN: 10 MG/DL (ref 6–20)
CO2: 28 MMOL/L (ref 21–32)
Calcium: 8.7 MG/DL (ref 8.5–10.1)
Chloride: 105 MMOL/L (ref 97–108)
Creatinine: 0.6 MG/DL (ref 0.6–1.3)
GFR est AA: >60 mL/min/{1.73_m2} (ref >60)
GFR est non-AA: >60 mL/min/{1.73_m2} (ref >60)
Glucose: 95 MG/DL (ref 65–100)
Potassium: 3.6 MMOL/L (ref 3.5–5.1)
Sodium: 143 MMOL/L (ref 136–145)

## 2008-11-02 LAB — HEMOGLOBIN A1C WITH EAG: Hemoglobin A1c: 5.7 % (ref 4.8–6.0)

## 2008-11-02 MED ORDER — NEBIVOLOL 10 MG TAB
10 mg | ORAL_TABLET | Freq: Every day | ORAL | Status: DC
Start: 2008-11-02 — End: 2009-11-08

## 2008-11-02 MED ORDER — ALPRAZOLAM 0.5 MG TAB
0.5 mg | ORAL_TABLET | Freq: Every evening | ORAL | Status: DC | PRN
Start: 2008-11-02 — End: 2009-12-28

## 2008-11-02 MED ORDER — METFORMIN 500 MG TAB
500 mg | ORAL_TABLET | Freq: Two times a day (BID) | ORAL | Status: DC
Start: 2008-11-02 — End: 2010-03-29

## 2008-11-02 MED ORDER — LEVOTHYROXINE 88 MCG TAB
88 mcg | ORAL_TABLET | Freq: Every day | ORAL | Status: DC
Start: 2008-11-02 — End: 2010-02-08

## 2008-11-02 MED ORDER — AMLODIPINE-BENAZEPRIL 10 MG-40 MG CAP
10-40 mg | ORAL_CAPSULE | Freq: Every day | ORAL | Status: DC
Start: 2008-11-02 — End: 2009-12-12

## 2008-11-02 MED ORDER — HYDROCHLOROTHIAZIDE 25 MG TAB
25 mg | ORAL_TABLET | Freq: Every day | ORAL | Status: DC
Start: 2008-11-02 — End: 2009-03-04

## 2008-11-02 MED ORDER — ESCITALOPRAM 20 MG TAB
20 mg | ORAL_TABLET | Freq: Every day | ORAL | Status: DC
Start: 2008-11-02 — End: 2009-03-02

## 2008-11-02 NOTE — Progress Notes (Signed)
HISTORY OF PRESENT ILLNESS  Katrina [name] is a 43 y.o. female here for a follow up visit.     HPI 1. Diabetes:  The patient's blood sugars at home have not been checked.  No hyper- or hypoglycemic symptoms.  No numbness or tingling.  Reports compliance with diet and medications.  She had an eye exam this year, her flu shot was last week at work.      2. Hypertension:  The patient has been compliant with medications.  There have been no known side effects.  The patient denies chest pain, dyspnea, lower extremity swelling.      3. Hyperlipidemia:  The patient has never been on medications.   Her LDL was above goal in July.  She was advised to improve her diet.    4.   Depression:  She has been doing well, her mood is good. She is taking 1 alprazolam to sleep through the night. She increased it with stress at work recently, but will cut it in half again. She does need refills of this and Lexapro today.        Current outpatient prescriptions   Medication Sig   ??? vitamin E (AQUA GEMS) 400 unit capsule Take 400 Units by mouth two (2) times a day.   ??? amlodipine-benazepril (LOTREL) 10-40 mg per capsule Take 1 Cap by mouth daily.   ??? levothyroxine (LEVOXYL) 88 mcg tablet Take 1 Tab by mouth daily.   ??? metformin (GLUCOPHAGE) 500 mg tablet Take 2 Tabs by mouth two (2) times daily (with meals).   ??? hydrochlorothiazide (HYDRODIURIL) 25 mg tablet Take 1 Tab by mouth daily.   ??? nebivolol (BYSTOLIC) 10 mg tablet Take 1 Tab by mouth daily.   ??? potassium chloride (K-DUR, KLOR-CON) 20 mEq tablet Take 1 Tab by mouth daily for 360 days.   ??? alprazolam (XANAX) 0.5 mg tablet Take 1 Tab by mouth nightly as needed for Sleep and Anxiety.   ??? escitalopram (LEXAPRO) 20 mg tablet Take 1 Tab by mouth daily.   ??? ursodiol (ACTIGALL) 300 mg capsule take 300 mg by mouth two (2) times a day. Two tabs bid    ??? DOCOSAHEXANOIC ACID/EPA (FISH OIL PO) take 1,000 mg by mouth two (2) times a day. 2 tabs bid    ??? PV W-O CAL/FERROUS FUMARATE/FA (M-VIT PO) take  by mouth.   ??? FERROUS FUMARATE/VIT BCOMP&C (SUPER B COMPLEX PO) take  by mouth.          Allergies   Allergen Reactions   ??? Codeine Rash and Itching        ROS see HPI    Filed Vitals:    11/02/2008  9:37 AM 11/02/2008  9:58 AM   BP: 138/80 130/85   Pulse: 64    Weight: 188 lb 9.6 oz (85.548 kg)         Physical Exam   Nursing note and vitals reviewed.  Constitutional: She appears well-developed and well-nourished. No distress.   Cardiovascular: Normal rate, regular rhythm, normal heart sounds and intact distal pulses.  Exam reveals no gallop and no friction rub.    No murmur heard.  Pulses:       Radial pulses are 2+ on the right side, and 2+ on the left side.        Dorsalis pedis pulses are 2+ on the right side, and 2+ on the left side.        Posterior tibial pulses are 2+ on the  right side, and 2+ on the left side.        No peripheral edema noted   Pulmonary/Chest: Effort normal and breath sounds normal. She has no wheezes. She has no rales.   Neurological: No sensory deficit.   Skin: Skin is warm, dry and intact.        No sores on feet       Results for Katrina [name], Katrina [name] (MRN [id number]) as of 11/02/2008 09:53   Ref. Range 07/13/2008 09:42   TSH, 3rd generation Latest Range: 0.36-3.74 UIU/ML 1.07       ASSESSMENT and PLAN    Diabetes mellitus--Has been very well controlled, with her last A1C of 5.7. Continue diet, metformin, benazpril. She will have labs now.  Pneumovax is up to date, flu shot already performed.  - metformin (GLUCOPHAGE) 500 mg tablet; Take 2 Tabs by mouth two (2) times daily (with meals).  - METABOLIC PANEL, BASIC; Future  - HEMOGLOBIN A1C; Future    Hyperlipidemia--Her LDL was above goal at last check and triglycerides increased. She will be due for lipid panel in three months. Continue diet, exercise.    Depression--Stable. Continue current medications.     Essential hypertension, benign-Very close to goal. Continue current medications, exercise.  - amlodipine-benazepril (LOTREL) 10-40 mg per capsule; Take 1 Cap by mouth daily.  - hydrochlorothiazide (HYDRODIURIL) 25 mg tablet; Take 1 Tab by mouth daily.  - nebivolol (BYSTOLIC) 10 mg tablet; Take 1 Tab by mouth daily.  - METABOLIC PANEL, BASIC; Future    Hypokalemia--BMP today to follow up.        Follow-up Disposition:  Return in about 6 months (around 05/03/2009).

## 2008-11-02 NOTE — Patient Instructions (Signed)
Should return for fasting cholesterol in three months.  No appointment necessary.  Call/send a message requesting the labslip.

## 2009-02-08 ENCOUNTER — Encounter

## 2009-02-12 LAB — METABOLIC PANEL, COMPREHENSIVE
A-G Ratio: 1.9 (ref 1.1–2.5)
ALT (SGPT): 19 IU/L (ref 0–40)
AST (SGOT): 16 IU/L (ref 0–40)
Albumin: 4.2 g/dL (ref 3.5–5.5)
Alk. phosphatase: 62 IU/L (ref 25–150)
BUN/Creatinine ratio: 24 — ABNORMAL HIGH (ref 9–23)
BUN: 14 mg/dL (ref 6–24)
Bilirubin, total: 0.2 mg/dL (ref 0.0–1.2)
CO2: 25 mmol/L (ref 20–32)
Calcium: 9.5 mg/dL (ref 8.7–10.2)
Chloride: 102 mmol/L (ref 97–108)
Creatinine: 0.59 mg/dL (ref 0.57–1.00)
GFR est AA: >59 mL/min/{1.73_m2} (ref >59)
GFR est non-AA: >59 mL/min/{1.73_m2} (ref >59)
GLOBULIN, TOTAL: 2.2 g/dL (ref 1.5–4.5)
Glucose: 77 mg/dL (ref 65–99)
Potassium: 4.2 mmol/L (ref 3.5–5.2)
Protein, total: 6.4 g/dL (ref 6.0–8.5)
Sodium: 140 mmol/L (ref 135–145)

## 2009-02-12 LAB — T4 (THYROXINE): T4, Total: 8.7 ug/dL (ref 4.5–12.0)

## 2009-02-12 LAB — LIPID PANEL WITH LDL/HDL RATIO
Cholesterol, total: 170 mg/dL (ref 100–199)
HDL Cholesterol: 42 mg/dL (ref >39)
LDL, calculated: 112 mg/dL — ABNORMAL HIGH (ref 0–99)
LDL/HDL Ratio: 2.7 ratio units (ref 0.0–3.2)
Triglyceride: 82 mg/dL (ref 0–149)
VLDL, calculated: 16 mg/dL (ref 5–40)

## 2009-02-12 LAB — TSH 3RD GENERATION: TSH: 1.87 u[IU]/mL (ref 0.450–4.500)

## 2009-02-12 LAB — HEMOGLOBIN A1C WITH EAG: Hemoglobin A1c: 6.5 % — ABNORMAL HIGH (ref 4.8–5.6)

## 2009-02-13 NOTE — Progress Notes (Addendum)
Quick Note:    Please call the patient: She should make an appointment to discuss her cholesterol and increased blood sugar average. She should cancer her April appt and make one for this month.  ______

## 2009-02-15 NOTE — Telephone Encounter (Signed)
S/w pt & relayed message as per Dr Maurine Minister.  Pt verbalized understanding of all & will call back to reschedule appt once she gets to work & can see her calendar.

## 2009-02-15 NOTE — Telephone Encounter (Signed)
Message copied by Fernanda Drum on Tue Feb 15, 2009  8:29 AM  ------       Message from: Shirlean Schlein D       Created: Sun Feb 13, 2009 11:40 AM         Please call the patient: She should make an appointment to discuss her cholesterol and increased blood sugar average.  She should cancer her April appt and make one for this month.

## 2009-02-17 MED ORDER — ATORVASTATIN 20 MG TAB
20 mg | ORAL_TABLET | Freq: Every day | ORAL | Status: DC
Start: 2009-02-17 — End: 2009-02-17

## 2009-02-17 MED ORDER — SIMVASTATIN 40 MG TAB
40 mg | ORAL_TABLET | Freq: Every evening | ORAL | Status: DC
Start: 2009-02-17 — End: 2009-04-05

## 2009-02-17 NOTE — Progress Notes (Signed)
HISTORY OF PRESENT ILLNESS  Katrina [name] is a 44 y.o. female here for a follow up visit.    HPI 1. Hyperlipidemia:  The patient has been compliant with medication--fish oil.  No known side effects.  Denies muscle aches. Her labs earlier this month were not much better--LDL was 112, but the TGs were down to 82.    2. Hypertension:  The patient has been compliant with medications.  There have been no known side effects.  Her blood pressure has been 140-150/80-90ss at home in the last couple days.  She began to check because she had some ruptured blood vessels in her eye.  She has not had any problems with exercising.       Current outpatient prescriptions   Medication Sig   ??? ascorbic acid (VITAMIN C) 500 mg tablet Take  by mouth.   ??? vitamin E (AQUA GEMS) 400 unit capsule Take 400 Units by mouth two (2) times a day.   ??? amlodipine-benazepril (LOTREL) 10-40 mg per capsule Take 1 Cap by mouth daily.   ??? levothyroxine (LEVOXYL) 88 mcg tablet Take 1 Tab by mouth daily.   ??? metformin (GLUCOPHAGE) 500 mg tablet Take 2 Tabs by mouth two (2) times daily (with meals).   ??? hydrochlorothiazide (HYDRODIURIL) 25 mg tablet Take 1 Tab by mouth daily.   ??? nebivolol (BYSTOLIC) 10 mg tablet Take 1 Tab by mouth daily.   ??? escitalopram (LEXAPRO) 20 mg tablet Take 1 Tab by mouth daily.   ??? alprazolam (XANAX) 0.5 mg tablet Take 1 Tab by mouth nightly as needed for Sleep and Anxiety.   ??? potassium chloride (K-DUR, KLOR-CON) 20 mEq tablet Take 1 Tab by mouth daily for 360 days.   ??? DOCOSAHEXANOIC ACID/EPA (FISH OIL PO) take 1,000 mg by mouth two (2) times a day. 2 tabs bid   ??? PV W-O CAL/FERROUS FUMARATE/FA (M-VIT PO) take  by mouth.   ??? FERROUS FUMARATE/VIT BCOMP&C (SUPER B COMPLEX PO) take  by mouth.          ROS see HPI    Filed Vitals:    02/17/09 0950 02/17/09 1008   BP: 160/92 160/88   Pulse: 68    Height: 5\' 5"  (1.651 m)    Weight: 191 lb 3.2 oz (86.728 kg)           Physical Exam   Vitals reviewed.   Constitutional: She appears well-developed and well-nourished. No distress.   Cardiovascular: Normal rate, regular rhythm, normal heart sounds and intact distal pulses.  Exam reveals no gallop and no friction rub.    No murmur heard.  Pulses:       Radial pulses are 2+ on the right side, and 2+ on the left side.        Dorsalis pedis pulses are 2+ on the right side, and 2+ on the left side.        Posterior tibial pulses are 2+ on the right side, and 2+ on the left side.        No peripheral edema noted   Pulmonary/Chest: Effort normal and breath sounds normal. She has no wheezes. She has no rales.       ASSESSMENT and PLAN    Hyperlipidemia--Improved, but her LDL is still above goal for a diabetic patient. She will begin simvastatin, and repeat her labs in two months.  - LIPID PANEL WITH LDL/HDL RATIO  - simvastatin (ZOCOR) 40 mg tablet; Take 1 Tab by mouth nightly.  Essential hypertension, benign--Above goal. She has been at goal at her most recent appointments. I will have her return in two weeks for blood pressure check. If her blood pressure is above goal, I may consider adding spironolactone and stopping hydrochlorothiazide and potassium.  She was encouraged to continue diet and exercise.    Encounter for long-term (current) use of other medications--Labs ordered today in anticipation of her follow up. To be drawn just before the appointment.   - ALT  - AST      Follow-up Disposition:  Return in about 2 weeks (around 03/03/2009) for blood pressure.

## 2009-02-21 NOTE — Telephone Encounter (Signed)
Message copied by Joetta Manners on Mon Feb 21, 2009  3:52 PM  ------       Message from: Shirlean Schlein D       Created: Thu Feb 17, 2009  4:31 PM       Regarding: tdd         No need to continue fish oil.              ----- Message -----          From: Denton Meek          Sent: 02/17/2009   4:23 PM            To: Maricela Curet, MD       Subject: Annell Greening: tdd                med question                                      ----- Message -----          From: Su Hoff          Sent: 02/17/2009   2:18 PM            To: [facility]       Subject: tdd                med question                            [phone] pt wants to know if she should continue taking the fish oil.

## 2009-02-21 NOTE — Telephone Encounter (Signed)
Patient advised to stop fish oil per order of Dr Maurine Minister.

## 2009-03-02 MED ORDER — ESCITALOPRAM 20 MG TAB
20 mg | ORAL_TABLET | Freq: Every day | ORAL | Status: DC
Start: 2009-03-02 — End: 2010-03-29

## 2009-03-04 MED ORDER — SPIRONOLACTONE 25 MG TAB
25 mg | ORAL_TABLET | Freq: Every day | ORAL | Status: DC
Start: 2009-03-04 — End: 2009-04-05

## 2009-03-04 NOTE — Progress Notes (Signed)
HISTORY OF PRESENT ILLNESS  Katrina [name] is a 44 y.o. female here for a follow up visit.     HPI 1. Hypertension:  The patient has been compliant with medications.  There have been no known side effects.  The patient denies chest pain, dyspnea, lower extremity swelling. She was hypertensive at her last appointment, is here today to follow up.  She had a sleep study in 2005, that was negative.  She does feel tired all the time. Her blood pressures have been around 140-150/90s in the morning and 130-140/80 at night.      Current outpatient prescriptions   Medication Sig   ??? lansoprazole (PREVACID SOLUTAB) 30 mg disintegrating tablet    ??? escitalopram (LEXAPRO) 20 mg tablet Take 1 Tab by mouth daily.   ??? ascorbic acid (VITAMIN C) 500 mg tablet Take  by mouth.   ??? simvastatin (ZOCOR) 40 mg tablet Take 1 Tab by mouth nightly.   ??? vitamin E (AQUA GEMS) 400 unit capsule Take 400 Units by mouth two (2) times a day.   ??? amlodipine-benazepril (LOTREL) 10-40 mg per capsule Take 1 Cap by mouth daily.   ??? levothyroxine (LEVOXYL) 88 mcg tablet Take 1 Tab by mouth daily.   ??? metformin (GLUCOPHAGE) 500 mg tablet Take 2 Tabs by mouth two (2) times daily (with meals).   ??? hydrochlorothiazide (HYDRODIURIL) 25 mg tablet Take 1 Tab by mouth daily.   ??? nebivolol (BYSTOLIC) 10 mg tablet Take 1 Tab by mouth daily.   ??? alprazolam (XANAX) 0.5 mg tablet Take 1 Tab by mouth nightly as needed for Sleep and Anxiety.   ??? potassium chloride (K-DUR, KLOR-CON) 20 mEq tablet Take 1 Tab by mouth daily for 360 days.   ??? PV W-O CAL/FERROUS FUMARATE/FA (M-VIT PO) take  by mouth.   ??? FERROUS FUMARATE/VIT BCOMP&C (SUPER B COMPLEX PO) take  by mouth.          Allergies   Allergen Reactions   ??? Codeine Rash and Itching          ROS see HPI        Physical Exam   Vitals reviewed.  Constitutional: She appears well-developed and well-nourished. No distress.    Cardiovascular: Normal rate, regular rhythm, normal heart sounds and intact distal pulses.  Exam reveals no gallop and no friction rub.    No murmur heard.  Pulses:       Radial pulses are 2+ on the right side, and 2+ on the left side.        Dorsalis pedis pulses are 2+ on the right side, and 2+ on the left side.        Posterior tibial pulses are 2+ on the right side, and 2+ on the left side.        No peripheral edema noted   Pulmonary/Chest: Effort normal and breath sounds normal. She has no wheezes. She has no rales.       Results for ALEK, PONCEDELEON (MRN [id number]) as of 03/04/2009 09:09   Ref. Range 02/11/2009 11:20   Sodium Latest Range: 135-145 mmol/L 140   Potassium Latest Range: 3.5-5.2 mmol/L 4.2   Chloride Latest Range: 97-108 mmol/L 102   CO2 Latest Range: 20-32 mmol/L 25   Glucose Latest Range: 65-99 mg/dL 77   BUN Latest Range: 6-24 mg/dL 14   Creatinine Latest Range: 0.57-1.00 mg/dL 0.45   BUN/Creatinine ratio Latest Range: 9-23   24 (H)   Calcium Latest Range: 8.7-10.2  mg/dL 9.5   GFR est AA Latest Range: >59 mL/min/1.73 >59   GFR est non-AA Latest Range: >59 mL/min/1.73 >59   Bilirubin, total Latest Range: 0.0-1.2 mg/dL 0.2   Protein, total Latest Range: 6.0-8.5 g/dL 6.4   Albumin Latest Range: 3.5-5.5 g/dL 4.2   A-G Ratio Latest Range: 1.1-2.5   1.9   ALT Latest Range: 0-40 IU/L 19   AST Latest Range: 0-40 IU/L 16   Alk. phosphatase Latest Range: 25-150 IU/L 62   Triglyceride Latest Range: 0-149 mg/dL 82   Cholesterol, total Latest Range: 100-199 mg/dL 295   HDL Cholesterol Latest Range: >39 mg/dL 42   LDL, calculated Latest Range: 0-99 mg/dL 621 (H)   LDL/HDL Ratio Latest Range: 0.0-3.2 ratio units 2.7   VLDL, calculated Latest Range: 5-40 mg/dL 16   Hemoglobin H0Q Latest Range: 4.8-5.6 % 6.5 (H)   T4 Latest Range: 4.5-12.0 ug/dL 8.7   TSH, 3rd generation Latest Range: 0.450-4.500 uIU/mL 1.870       ASSESSMENT and PLAN     Essential hypertension, benign--Poor control, but this does not appear to be new on review of her old blood pressures from home. Interestingly, her blood pressure higher in the morning, as if she is a non-dipper or has lost drug effect. I will check her aldosterone and renin today, stop hydrochlorothiazide and potassium and start spironolactone. I will also send her for another sleep evaluation.  - ALDOSTERONE  - ALDOSTERONE/RENIN ACT.RATIO  - spironolactone (ALDACTONE) 25 mg tablet; Take 1 Tab by mouth daily.  - REFERRAL TO SLEEP STUDIES    Follow-up Disposition:  Return in about 1 month (around 04/01/2009) for blood pressure.

## 2009-03-08 LAB — ALDOSTERONE/RENIN ACTIVITY
Aldosterone: 27.4 ng/dL (ref 0.0–30.0)
Renin Activity: <0.15 ng/mL/hr

## 2009-03-09 LAB — ALDOSTERONE: Aldosterone: 31 ng/dL

## 2009-03-14 LAB — AST: AST (SGOT): 19 IU/L (ref 0–40)

## 2009-03-14 LAB — METABOLIC PANEL, BASIC
BUN/Creatinine ratio: 27 — ABNORMAL HIGH (ref 9–23)
BUN: 14 mg/dL (ref 6–24)
CO2: 24 mmol/L (ref 20–32)
Calcium: 9.4 mg/dL (ref 8.7–10.2)
Chloride: 104 mmol/L (ref 97–108)
Creatinine: 0.52 mg/dL — ABNORMAL LOW (ref 0.57–1.00)
GFR est AA: >59 mL/min/{1.73_m2} (ref >59)
GFR est non-AA: >59 mL/min/{1.73_m2} (ref >59)
Glucose: 98 mg/dL (ref 65–99)
Potassium: 4 mmol/L (ref 3.5–5.2)
Sodium: 139 mmol/L (ref 135–145)

## 2009-03-14 LAB — ALT: ALT (SGPT): 22 IU/L (ref 0–40)

## 2009-03-14 LAB — CK: Creatine Kinase,Total: 173 U/L (ref 24–173)

## 2009-03-14 NOTE — Progress Notes (Signed)
HISTORY OF PRESENT ILLNESS  Katrina [name] is a 44 y.o. female here for an acute visit.     HPI 1. Leg pain and swelling: Last week, her swelling got very bad in her feet and ankles at the end of the day. She feels puffy all over, as well. She also feels her legs are throbbing when she is running or walking.  It is as if needles are sticking her. She has been trying to stay active.  She denies dyspnea, chest pain or pressure.  Since starting her spironolactone, her blood pressure has been 150/80-90 consistently in the morning. She has a sleep study on the 23rd. She had also started the simvastatin recently.      Current outpatient prescriptions   Medication Sig   ??? lansoprazole (PREVACID SOLUTAB) 30 mg disintegrating tablet    ??? spironolactone (ALDACTONE) 25 mg tablet Take 1 Tab by mouth daily.   ??? escitalopram (LEXAPRO) 20 mg tablet Take 1 Tab by mouth daily.   ??? ascorbic acid (VITAMIN C) 500 mg tablet Take  by mouth.   ??? simvastatin (ZOCOR) 40 mg tablet Take 1 Tab by mouth nightly.   ??? vitamin E (AQUA GEMS) 400 unit capsule Take 400 Units by mouth two (2) times a day.   ??? amlodipine-benazepril (LOTREL) 10-40 mg per capsule Take 1 Cap by mouth daily.   ??? levothyroxine (LEVOXYL) 88 mcg tablet Take 1 Tab by mouth daily.   ??? metformin (GLUCOPHAGE) 500 mg tablet Take 2 Tabs by mouth two (2) times daily (with meals).   ??? nebivolol (BYSTOLIC) 10 mg tablet Take 1 Tab by mouth daily.   ??? alprazolam (XANAX) 0.5 mg tablet Take 1 Tab by mouth nightly as needed for Sleep and Anxiety.   ??? PV W-O CAL/FERROUS FUMARATE/FA (M-VIT PO) take  by mouth.   ??? FERROUS FUMARATE/VIT BCOMP&C (SUPER B COMPLEX PO) take  by mouth.        Allergies   Allergen Reactions   ??? Codeine Rash and Itching          ROS see HPI    BP 140/88   Pulse 66   Ht 5\' 5"  (1.651 m)   Wt 192 lb 3.2 oz (87.181 kg)        Physical Exam   Vitals reviewed.  Constitutional: She appears well-developed and well-nourished. No distress.    Cardiovascular: Normal rate, regular rhythm, normal heart sounds and intact distal pulses.  Exam reveals no gallop and no friction rub.    No murmur heard.  Pulses:       Radial pulses are 2+ on the right side, and 2+ on the left side.        Dorsalis pedis pulses are 2+ on the right side, and 2+ on the left side.        Posterior tibial pulses are 2+ on the right side, and 2+ on the left side.        No peripheral edema noted   Pulmonary/Chest: Effort normal and breath sounds normal. She has no wheezes. She has no rales.   Musculoskeletal:        No leg tenderness, or cords noted       ASSESSMENT and PLAN      Edema--Though it would be unusual, this could be in response to stopping hydrochlorothiazide and starting spironolactone. I will have her hold the medications until I get her labs results today. She should also have her potassium levels assessed. I encouraged  her to have her sleep study later this month. After her labs, I will decide on diuretic therapy, and blood pressure management.   - METABOLIC PANEL, BASIC    Myalgia--Likely related to the edema, but I will assess for sign that the simvastatin is a cause. She will hold the simvastatin tonight, until she hears from me about continuing or stopping.  - ALT  - AST  - CK    Encounter for long-term (current) use of other medications  - CK        Follow-up Disposition:  Return in about 1 month (around 04/14/2009) for blood pressure, cholesterol.

## 2009-03-31 ENCOUNTER — Encounter

## 2009-04-05 MED ORDER — SIMVASTATIN 40 MG TAB
40 mg | ORAL_TABLET | Freq: Every evening | ORAL | Status: DC
Start: 2009-04-05 — End: 2009-06-08

## 2009-04-05 NOTE — Patient Instructions (Signed)
Take Lotrel in the evening with your simvastatin, then continue the Bystolic, hydrochlorothiazide in the morning.

## 2009-04-05 NOTE — Progress Notes (Signed)
HISTORY OF PRESENT ILLNESS  Katrina [name] is a 44 y.o. female here for a follow up visit.     HPI 1. Hypertension:  The patient has been compliant with medications.  There have been no known side effects.  The patient denies chest pain, dyspnea, lower extremity swelling. Her blood pressures are around 150s/80s first thing in the morning, 130s/80s later in the day. She had her sleep study was 03/30/09, and will follow up for results soon.  Of note, her myalgia, leg discomfort has resolved, even on the simvastatin.         Current outpatient prescriptions   Medication Sig   ??? potassium chloride (K-DUR, KLOR-CON) 20 mEq tablet    ??? hydrochlorothiazide (HYDRODIURIL) 25 mg tablet Take 25 mg by mouth daily.   ??? lansoprazole (PREVACID SOLUTAB) 30 mg disintegrating tablet    ??? escitalopram (LEXAPRO) 20 mg tablet Take 1 Tab by mouth daily.   ??? ascorbic acid (VITAMIN C) 500 mg tablet Take  by mouth.   ??? simvastatin (ZOCOR) 40 mg tablet Take 1 Tab by mouth nightly.   ??? vitamin E (AQUA GEMS) 400 unit capsule Take 400 Units by mouth two (2) times a day.   ??? amlodipine-benazepril (LOTREL) 10-40 mg per capsule Take 1 Cap by mouth daily.   ??? levothyroxine (LEVOXYL) 88 mcg tablet Take 1 Tab by mouth daily.   ??? metformin (GLUCOPHAGE) 500 mg tablet Take 2 Tabs by mouth two (2) times daily (with meals).   ??? nebivolol (BYSTOLIC) 10 mg tablet Take 1 Tab by mouth daily.   ??? alprazolam (XANAX) 0.5 mg tablet Take 1 Tab by mouth nightly as needed for Sleep and Anxiety.   ??? PV W-O CAL/FERROUS FUMARATE/FA (M-VIT PO) take  by mouth.   ??? FERROUS FUMARATE/VIT BCOMP&C (SUPER B COMPLEX PO) take  by mouth.          Allergies   Allergen Reactions   ??? Codeine Rash and Itching          ROS see HPI      Filed Vitals:    04/05/09 1116 04/05/09 1132   BP: 142/84 135/76   Pulse: 68    Resp: 16    Height: 5\' 5"  (1.651 m)    Weight: 193 lb 3.2 oz (87.635 kg)         Physical Exam   Vitals reviewed.   Constitutional: She appears well-developed and well-nourished. No distress.   Cardiovascular: Normal rate, regular rhythm, normal heart sounds and intact distal pulses.  Exam reveals no gallop and no friction rub.    No murmur heard.  Pulses:       Radial pulses are 2+ on the right side, and 2+ on the left side.        Dorsalis pedis pulses are 2+ on the right side, and 2+ on the left side.        Posterior tibial pulses are 2+ on the right side, and 2+ on the left side.        No peripheral edema noted   Pulmonary/Chest: Effort normal and breath sounds normal. She has no wheezes. She has no rales.       ASSESSMENT and PLAN    Essential hypertension, benign--Improved, but still with increased am blood pressures. She probably does not dip down at night, as usual. She will follow up on her sleep study results and recommendations. For now, I would like her to continue her medications, but move her Lotrel  to bedtime.  She will follow up in two months, with labs before. I counseled her about sodium restriction.  - METABOLIC PANEL, BASIC    Hyperlipidemia--Labs ordered today in anticipation of her follow up. To be drawn just before the appointment.   - LIPID PANEL WITH LDL/HDL RATIO  - AST  - ALT  - simvastatin (ZOCOR) 40 mg tablet; Take 1 Tab by mouth nightly.    Diabetes mellitus--Labs ordered today in anticipation of her follow up. To be drawn just before the appointment.   - LIPID PANEL WITH LDL/HDL RATIO  - HEMOGLOBIN A1C  - METABOLIC PANEL, BASIC    Follow-up Disposition:  Return in about 2 months (around 06/05/2009) for blood pressure, diabetes.

## 2009-04-27 NOTE — Telephone Encounter (Signed)
Pt. States her GI Dr instructed her to start iron every day. Informed she can take otc slow fe and if she gets constipated to either take colace or sennakot and to let her DR's know.

## 2009-04-27 NOTE — Telephone Encounter (Signed)
Message copied by Merril Abbe on Wed Apr 27, 2009  2:54 PM  ------       Message from: Luanne Bras       Created: Wed Apr 27, 2009  2:05 PM       Regarding: Non-Urgent Medical Question       Contact: [phone]         gastro doc at [facility] did iron level last friday. mine=3%. told me to start iron supplement.  is there one you recommend?  someone mentioned to me there is an Rx that is pill combining iron and something to counteract constipation.  is there an rx?

## 2009-05-03 MED ORDER — POTASSIUM CHLORIDE SR 20 MEQ TAB, PARTICLES/CRYSTALS
20 mEq | ORAL_TABLET | Freq: Every day | ORAL | Status: DC
Start: 2009-05-03 — End: 2009-06-08

## 2009-05-03 NOTE — Telephone Encounter (Signed)
V.o. Per dr dennis

## 2009-06-01 LAB — LIPID PANEL WITH LDL/HDL RATIO
Cholesterol, total: 146 mg/dL (ref 100–199)
HDL Cholesterol: 48 mg/dL (ref >39)
LDL, calculated: 82 mg/dL (ref 0–99)
LDL/HDL Ratio: 1.7 ratio units (ref 0.0–3.2)
Triglyceride: 81 mg/dL (ref 0–149)
VLDL, calculated: 16 mg/dL (ref 5–40)

## 2009-06-01 LAB — METABOLIC PANEL, BASIC
BUN/Creatinine ratio: 17 (ref 9–23)
BUN: 10 mg/dL (ref 6–24)
CO2: 26 mmol/L (ref 20–32)
Calcium: 9.4 mg/dL (ref 8.7–10.2)
Chloride: 102 mmol/L (ref 97–108)
Creatinine: 0.59 mg/dL (ref 0.57–1.00)
GFR est AA: 131 mL/min/{1.73_m2} (ref >59)
GFR est non-AA: 113 mL/min/{1.73_m2} (ref >59)
Glucose: 89 mg/dL (ref 65–99)
Potassium: 3.2 mmol/L — ABNORMAL LOW (ref 3.5–5.2)
Sodium: 144 mmol/L (ref 135–145)

## 2009-06-01 LAB — HEMOGLOBIN A1C WITH EAG: Hemoglobin A1c: 6.2 % — ABNORMAL HIGH (ref 4.8–5.6)

## 2009-06-01 LAB — ALT: ALT (SGPT): 18 IU/L (ref 0–40)

## 2009-06-01 LAB — AST: AST (SGOT): 19 IU/L (ref 0–40)

## 2009-06-08 ENCOUNTER — Encounter

## 2009-06-08 MED ORDER — POTASSIUM CHLORIDE SR 20 MEQ TAB, PARTICLES/CRYSTALS
20 mEq | ORAL_TABLET | Freq: Every day | ORAL | Status: DC
Start: 2009-06-08 — End: 2009-08-09

## 2009-06-08 MED ORDER — CHLORTHALIDONE 25 MG TAB
25 mg | ORAL_TABLET | Freq: Every day | ORAL | Status: DC
Start: 2009-06-08 — End: 2009-07-14

## 2009-06-08 MED ORDER — ROSUVASTATIN 20 MG TAB
20 mg | ORAL_TABLET | Freq: Every day | ORAL | Status: DC
Start: 2009-06-08 — End: 2009-07-20

## 2009-06-08 NOTE — Progress Notes (Signed)
Pt. Scheduled for renal ultrasound fri 06/10/09 930a [facility], npo 8hrs, full bladder [facility].

## 2009-06-08 NOTE — Progress Notes (Signed)
HISTORY OF PRESENT ILLNESS  Katrina [name] is a 44 y.o. female here for a follow up visit.       HPI 1. Diabetes:  The patient's blood sugars at home have not been checked lately. No hyper- or hypoglycemic symptoms. She has no numbness or tingling.  Reports compliance with diet and medications.  She is not taking aspirin daily.    2. Hypertension:  The patient has been compliant with medications, moved her Lotrel to bedtime in March. Her blood pressures were at 130-140s/80-90s mostly until this month, when they shot up.  She has been getting 150-180/90-100 consistently with her machine at home.  The patient does note some chest pain at night with lying down, no associated dyspnea or exertional pain, lower extremity swelling has come up at times since March, and she has had throbbing in her legs with walking. With her machine in the office today, she gets 172/96.     3. Hyperlipidemia:  The patient has been compliant with medicatios.  No known side effects.  Denies muscle aches. She had advanced diagnostic labs at [facility] in April.    4. GERD:  She is doing well.  She denies weight loss, melena, dysphagia, heartburn. She does have the chest pain sometimes at night, but cannot related it to her diet. She was started on iron by Dr. Rose Fillers for low levels and hemoglobin of 11.2 in April.    5. Depression: She has been anxious about her blood pressure, but otherwise says her mood is good. She is sleeping well.         Current outpatient prescriptions   Medication Sig   ??? potassium chloride (K-DUR, KLOR-CON) 20 mEq tablet Take 1 Tab by mouth daily.   ??? hydrochlorothiazide (HYDRODIURIL) 25 mg tablet Take 25 mg by mouth daily.   ??? simvastatin (ZOCOR) 40 mg tablet Take 1 Tab by mouth nightly.   ??? lansoprazole (PREVACID SOLUTAB) 30 mg disintegrating tablet    ??? escitalopram (LEXAPRO) 20 mg tablet Take 1 Tab by mouth daily.   ??? ascorbic acid (VITAMIN C) 500 mg tablet Take  by mouth.    ??? vitamin E (AQUA GEMS) 400 unit capsule Take 400 Units by mouth two (2) times a day.   ??? amlodipine-benazepril (LOTREL) 10-40 mg per capsule Take 1 Cap by mouth daily.   ??? levothyroxine (LEVOXYL) 88 mcg tablet Take 1 Tab by mouth daily.   ??? metformin (GLUCOPHAGE) 500 mg tablet Take 2 Tabs by mouth two (2) times daily (with meals).   ??? nebivolol (BYSTOLIC) 10 mg tablet Take 1 Tab by mouth daily.   ??? alprazolam (XANAX) 0.5 mg tablet Take 1 Tab by mouth nightly as needed for Sleep and Anxiety.   ??? PV W-O CAL/FERROUS FUMARATE/FA (M-VIT PO) take  by mouth.   ??? FERROUS FUMARATE/VIT BCOMP&C (SUPER B COMPLEX PO) take  by mouth.        Allergies   Allergen Reactions   ??? Codeine Rash and Itching          ROS see HPI    Filed Vitals:    06/08/09 0945 06/08/09 1005   BP: 142/88 170/95   Pulse: 68    Height: 5\' 5"  (1.651 m)    Weight: 195 lb (88.451 kg)           Physical Exam   Vitals reviewed.  Constitutional: She is oriented to person, place, and time. She appears well-developed and well-nourished. No distress.   Cardiovascular: Normal  rate, regular rhythm, normal heart sounds and intact distal pulses.  Exam reveals no gallop and no friction rub.    No murmur heard.  Pulses:       Radial pulses are 2+ on the right side, and 2+ on the left side.        Dorsalis pedis pulses are 2+ on the right side, and 2+ on the left side.        Posterior tibial pulses are 2+ on the right side, and 2+ on the left side.        No peripheral edema noted   Pulmonary/Chest: Effort normal and breath sounds normal. She has no wheezes. She has no rales.   Neurological: She is alert and oriented to person, place, and time. No cranial nerve deficit.   Skin: Skin is warm, dry and intact.   Psychiatric: She has a normal mood and affect. Her behavior is normal. Thought content normal.       EKG:  Normal sinus rhythm with normal axis, intervals.  No ST-T changes.         Results for MATTISEN, POHLMANN (MRN [id number]) as of 06/08/2009 12:53    Ref. Range 05/31/2009 09:24   Sodium Latest Range: 135-145 mmol/L 144   Potassium Latest Range: 3.5-5.2 mmol/L 3.2 (L)   Chloride Latest Range: 97-108 mmol/L 102   CO2 Latest Range: 20-32 mmol/L 26   Glucose Latest Range: 65-99 mg/dL 89   BUN Latest Range: 6-24 mg/dL 10   Creatinine Latest Range: 0.57-1.00 mg/dL 0.45   BUN/Creatinine ratio Latest Range: 9-23  17   Calcium Latest Range: 8.7-10.2 mg/dL 9.4   GFR est AA Latest Range: >59 mL/min/1.73 131   GFR est non-AA Latest Range: >59 mL/min/1.73 113   ALT Latest Range: 0-40 IU/L 18   AST Latest Range: 0-40 IU/L 19   Triglyceride Latest Range: 0-149 mg/dL 81   Cholesterol, total Latest Range: 100-199 mg/dL 409   HDL Cholesterol Latest Range: >39 mg/dL 48   LDL, calculated Latest Range: 0-99 mg/dL 82   LDL/HDL Ratio Latest Range: 0.0-3.2 ratio units 1.7   VLDL, calculated Latest Range: 5-40 mg/dL 16   Hemoglobin W1X Latest Range: 4.8-5.6 % 6.2 (H)       ASSESSMENT and PLAN    Essential hypertension, benign--She has had a sudden worsening of her daily blood pressures, with constant sodium restriction, exercise, medications. I would like to rule out adrenal adenoma, cortisol excess. She should continue to watch sodium and I will switch to a more effective diuretic--chlorthalidone--while increasing her potassium. She will have a renal ultrasound as well. If not improving in two weeks, I will refer her to nephrology.  - US RENAL COMPLETE; Future  - chlorthalidone (HYGROTEN) 25 mg tablet; Take 1 Tab by mouth daily.  - potassium chloride (K-DUR, KLOR-CON) 20 mEq tablet; Take 1 Tab by mouth daily.  - CORTISOL, AM  - POTASSIUM    Chest pain--Atypical. It may be reflux, due to the new iron supplement or related to her blood pressure. No specific changes noted today. I will follow up on this again in two weeks.  - AMB POC EKG ROUTINE W/ 12 LEADS, INTER & REP     Diabetes mellitus--Very well-controlled. A1C is 6.2%. She will continue to monitor her diet, continue metformin, ACE inhibitor and statin, begin taking aspirin 81 mg daily. Pneumovax is up to date.    Hyperlipidemia--I reviewed her labs from April and the ones from earlier this month. Her  LDL and particle numbers could be improved. She is high risk and reports improved liver disease. I suggest she change from simvastatin to Crestor and repeat lipids in two months.  - rosuvastatin (CRESTOR) 20 mg tablet; Take 1 Tab by mouth daily.    Ge reflux--Stable, though I wonder if this is causing some of her chest pain. I will have her continue Prevacid, watch diet, follow up in two weeks.    Depression--Stable. Continue Lexapro.    Encounter for long-term (current) use of other medications  - POTASSIUM        Follow-up Disposition:  Return in about 2 weeks (around 06/22/2009) for blood pressure.

## 2009-06-09 ENCOUNTER — Encounter

## 2009-06-09 NOTE — Telephone Encounter (Signed)
Pt. Notified the renal ultrasound was cancelled at [facility] and a renal doppler is scheduled for tues 06/14/09 830a [facility]. Dx htn to assess flow.

## 2009-06-14 NOTE — Progress Notes (Signed)
See scanned report.

## 2009-06-15 LAB — CORTISOL, AM: Cortisol, a.m.: 8.1 ug/dL (ref 6.2–19.4)

## 2009-06-15 LAB — POTASSIUM: Potassium: 3.7 mmol/L (ref 3.5–5.2)

## 2009-06-15 NOTE — Telephone Encounter (Signed)
Spoke to pt about her renal ultrasound was negative. Reminded to have labs done prior to follow up visit. Pt states she has had them done.

## 2009-06-23 NOTE — Progress Notes (Signed)
HISTORY OF PRESENT ILLNESS  Katrina [name] is a 44 y.o. female here for a follow up visit.     HPI 1. Hypertension:  The patient has been compliant with medications.  Her blood pressures have been 150s-170/90s mostly at home. She had 130s and 140s on two occasions in the evening. The blood pressures are always low after exercising; she feels good when it is low.  There have been no known side effects to the chlorthalidone.  The patient denies chest pain, dyspnea, lower extremity swelling.        Current outpatient prescriptions   Medication Sig   ??? FERROUS SULFATE (SLOW FE PO) Take  by mouth.   ??? docusate sodium (COLACE) 100 mg capsule Take 100 mg by mouth two (2) times a day.   ??? chlorthalidone (HYGROTEN) 25 mg tablet Take 1 Tab by mouth daily.   ??? potassium chloride (K-DUR, KLOR-CON) 20 mEq tablet Take 1 Tab by mouth daily.   ??? rosuvastatin (CRESTOR) 20 mg tablet Take 1 Tab by mouth daily.   ??? lansoprazole (PREVACID SOLUTAB) 30 mg disintegrating tablet    ??? escitalopram (LEXAPRO) 20 mg tablet Take 1 Tab by mouth daily.   ??? ascorbic acid (VITAMIN C) 500 mg tablet Take  by mouth.   ??? vitamin E (AQUA GEMS) 400 unit capsule Take 400 Units by mouth two (2) times a day.   ??? amlodipine-benazepril (LOTREL) 10-40 mg per capsule Take 1 Cap by mouth daily.   ??? levothyroxine (LEVOXYL) 88 mcg tablet Take 1 Tab by mouth daily.   ??? metformin (GLUCOPHAGE) 500 mg tablet Take 2 Tabs by mouth two (2) times daily (with meals).   ??? nebivolol (BYSTOLIC) 10 mg tablet Take 1 Tab by mouth daily.   ??? alprazolam (XANAX) 0.5 mg tablet Take 1 Tab by mouth nightly as needed for Sleep and Anxiety.   ??? PV W-O CAL/FERROUS FUMARATE/FA (M-VIT PO) take  by mouth.   ??? FERROUS FUMARATE/VIT BCOMP&C (SUPER B COMPLEX PO) take  by mouth.          Allergies   Allergen Reactions   ??? Codeine Rash and Itching   ??? Aldactone (Spironolactone) Swelling          ROS see HPI    Filed Vitals:    06/23/09 0905 06/23/09 0915   BP: 142/90 148/85   Pulse: 64     Height: 5\' 5"  (1.651 m)    Weight: 189 lb 9.6 oz (86.002 kg)           Physical Exam   Vitals reviewed.  Constitutional: She appears well-developed and well-nourished. No distress.   Cardiovascular: Normal rate, regular rhythm, normal heart sounds and intact distal pulses.  Exam reveals no gallop and no friction rub.    No murmur heard.  Pulses:       Radial pulses are 2+ on the right side, and 2+ on the left side.        Dorsalis pedis pulses are 2+ on the right side, and 2+ on the left side.        Posterior tibial pulses are 2+ on the right side, and 2+ on the left side.        No peripheral edema noted   Pulmonary/Chest: Effort normal and breath sounds normal. She has no wheezes. She has no rales.       ASSESSMENT and PLAN    Essential hypertension, benign--Poor control, non-dipping blood pressures at night. Her renal artery ultrasound  was normal. She still has significantly increased blood pressures at home, only low after exercise. She has no chest pain, but should have her EF, septum evaluated and blood pressure monitored with exercise. I will also refer her to nephrology for assistance with her blood pressure. Continue current medications for now.  - ECHO TTE STRESS EXRCSE COMP; Future  - REFERRAL TO NEPHROLOGY      Follow-up Disposition:  Return in about 2 months (around 08/23/2009) for diabetes.

## 2009-06-23 NOTE — Progress Notes (Signed)
Pt. Scheduled for stress echo 06/28/09 230p CVA and appt, 07/01/09 145p Dr Dorian Heckle for uncontrolled htn, records faxed to [facility] office.

## 2009-06-28 NOTE — Progress Notes (Signed)
See scanned document

## 2009-07-05 ENCOUNTER — Encounter

## 2009-07-12 ENCOUNTER — Encounter

## 2009-07-14 MED ORDER — SODIUM CHLORIDE 0.65 % NASAL DROPS
0.65 % | NASAL | Status: DC | PRN
Start: 2009-07-14 — End: 2009-08-09

## 2009-07-14 MED ADMIN — ioversol (OPTIRAY) 350 mg/mL contrast solution 100 mL: INTRAVENOUS | @ 14:00:00 | NDC 00019133311

## 2009-07-14 MED ADMIN — 0.9% sodium chloride infusion: INTRAVENOUS | @ 14:00:00 | NDC 00409798309

## 2009-07-14 NOTE — Progress Notes (Signed)
HISTORY OF PRESENT ILLNESS  Katrina [name] is a 44 y.o. female.  Epistaxis   The history is provided by the patient. This is a new problem. The current episode started 3 to 5 hours ago. The problem has been resolved. The problem is associated with aspirin. The bleeding has been from the right nare. She has tried applying pressure for the symptoms. The treatment provided significant relief. Her past medical history is significant for bleeding disorder and HTN. Her past medical history does not include sinus problems or frequent nosebleeds.   Hypertension   This is a chronic problem. The problem has been gradually worsening. Pertinent negatives include no headaches and no dizziness. Risk factors include hypertension.   /  Bleed occurred spontaneously, off asa x 3 days. Severe htn being evaluated by dr Venetia Maxon    Review of Systems   HENT: Positive for nosebleeds.    Neurological: Negative for dizziness and headaches.   Psychiatric/Behavioral: The patient is nervous/anxious.        Physical Exam   Nursing note and vitals reviewed.  Constitutional: No distress.   HENT:   Nose: Mucosal edema present. No nose lacerations. No epistaxis.   Cardiovascular: Normal rate and regular rhythm.  Exam reveals no gallop and no friction rub.    No murmur heard.  Pulmonary/Chest: Effort normal and breath sounds normal.       ASSESSMENT and PLAN  Katrina [name] was seen today for epistaxis and hypertension.    Diagnoses and associated orders for this visit:    Epistaxis  - REFERRAL TO ENT  - sodium chloride (AYR SALINE) 0.65 % Drop; 2 Drops by Nasal route every two (2) hours as needed. Administer to right and left nostril.  -     Hold asa pending evaluation    Essential hypertension, malignant- See nephrologist as directed.      Other Orders  - doxazosin (CARDURA) 2 mg tablet; Take 4 mg by mouth nightly.

## 2009-07-20 ENCOUNTER — Encounter

## 2009-07-20 MED ORDER — ROSUVASTATIN 20 MG TAB
20 mg | ORAL_TABLET | Freq: Every day | ORAL | Status: DC
Start: 2009-07-20 — End: 2009-07-21

## 2009-07-21 ENCOUNTER — Encounter

## 2009-07-21 MED ORDER — ROSUVASTATIN 20 MG TAB
20 mg | ORAL_TABLET | Freq: Every day | ORAL | Status: DC
Start: 2009-07-21 — End: 2010-04-25

## 2009-07-21 NOTE — Telephone Encounter (Signed)
V.o. Per dr dennis

## 2009-07-26 NOTE — Telephone Encounter (Signed)
Message copied by Merril Abbe on Tue Jul 26, 2009  9:49 AM  ------       Message from: Shirlean Schlein D       Created: Mon Jul 25, 2009 10:03 PM       Regarding: tdd         She had it checked in February, but may need an appointment if there is an issue with her eye              ----- Message -----          From: Su Hoff          Sent: 07/25/2009   2:19 PM            To: [facility] [employer]       Subject: tdd               lab question                             [phone] pt is calling and wonders when did she last have her thyroid checked.  She says her sister is a Engineer, civil (consulting) and thought that her right eye was bulging and ask when did she last have her thyroid checked.

## 2009-07-26 NOTE — Telephone Encounter (Signed)
Pt. States her sister thinks her right eye appears larger than the left but Pt. Does not notice any difference. Pt. States her mother passed away fri., she died in her sleep.

## 2009-07-28 NOTE — Telephone Encounter (Signed)
Pt. Notified she needs to find out what glucometer is covered by [employer] and let us know since first order was rejected. Pt. 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 Reminded to request Dr Manson Passey to forward records since we're not attached to [facility].

## 2009-08-09 MED ORDER — AZITHROMYCIN 250 MG TAB
250 mg | ORAL_TABLET | ORAL | Status: AC
Start: 2009-08-09 — End: 2009-08-14

## 2009-08-09 NOTE — Progress Notes (Signed)
HISTORY OF PRESENT ILLNESS  Katrina [name] is a 44 y.o. female here for an acute visit.     HPI 1. Cough: Ms. Alden has had a dry cough since her adrenal surgery on 7/25.  There is only occasional mucus production. She has no sore throat.  She noted some swollen lymph nodes in her neck yesterday. She denies fevers, chills, shortness of breath, wheezing. She has had some aching in her mid back for the last couple days chest pain.  Of note, her blood pressures have been much better since the surgery.      Current outpatient prescriptions   Medication Sig   ??? eplerenone (INSPRA) 25 mg tablet Take 50 mg by mouth daily.   ??? rosuvastatin (CRESTOR) 20 mg tablet Take 1 Tab by mouth daily.   ??? doxazosin (CARDURA) 2 mg tablet Take 8 mg by mouth nightly.   ??? FERROUS SULFATE (SLOW FE PO) Take  by mouth.   ??? docusate sodium (COLACE) 100 mg capsule Take 100 mg by mouth two (2) times a day.   ??? lansoprazole (PREVACID SOLUTAB) 30 mg disintegrating tablet    ??? escitalopram (LEXAPRO) 20 mg tablet Take 1 Tab by mouth daily.   ??? ascorbic acid (VITAMIN C) 500 mg tablet Take  by mouth.   ??? vitamin E (AQUA GEMS) 400 unit capsule Take 400 Units by mouth two (2) times a day.   ??? amlodipine-benazepril (LOTREL) 10-40 mg per capsule Take 1 Cap by mouth daily.   ??? levothyroxine (LEVOXYL) 88 mcg tablet Take 1 Tab by mouth daily.   ??? metformin (GLUCOPHAGE) 500 mg tablet Take 2 Tabs by mouth two (2) times daily (with meals).   ??? nebivolol (BYSTOLIC) 10 mg tablet Take 1 Tab by mouth daily.   ??? alprazolam (XANAX) 0.5 mg tablet Take 1 Tab by mouth nightly as needed for Sleep and Anxiety.   ??? PV W-O CAL/FERROUS FUMARATE/FA (M-VIT PO) take  by mouth.   ??? FERROUS FUMARATE/VIT BCOMP&C (SUPER B COMPLEX PO) take  by mouth.          Allergies   Allergen Reactions   ??? Codeine Rash and Itching   ??? Aldactone (Spironolactone) Swelling          ROS see HPI     BP 108/72   Pulse 80   Temp 98 ??F (36.7 ??C)   Ht 5\' 5"  (1.651 m)   Wt 186 lb (84.369 kg)   BMI 30.95 kg/m2   LMP 07/23/2009     Physical Exam   Vitals reviewed.  Constitutional: She appears well-developed and well-nourished.   HENT:   Right Ear: External ear normal. No swelling. Tympanic membrane is not erythematous. No middle ear effusion.   Left Ear: External ear normal. No swelling. Tympanic membrane is not erythematous.  No middle ear effusion.   Nose: Nose normal.   Mouth/Throat: Oropharynx is clear and moist. No oropharyngeal exudate.   Eyes: Conjunctivae are normal. Right eye exhibits no discharge. Left eye exhibits no discharge. No scleral icterus.   Neck: Neck supple.   Cardiovascular: Normal rate and regular rhythm.    Pulmonary/Chest: Effort normal and breath sounds normal. No stridor. She has no wheezes. She has no rales.   Lymphadenopathy:     She has cervical adenopathy.       ASSESSMENT and PLAN    Cough--URI, possible atypical bacterial infection. No sign of pneumonia on exam. She has been in and out of the hospital recently, so  I will treat empirically for bacterial infection. She will follow up if not better, or if worse.   - azithromycin (ZITHROMAX) 250 mg tablet; Take  by mouth for 5 days. Take two tablets today then one tablet daily        Follow-up Disposition:  Return in about 3 months (around 11/09/2009) for choleserol, thyroid, blood sugar.

## 2009-08-09 NOTE — Progress Notes (Signed)
Pt here today with complaints of dry cough since surgery 08/01/09. Noted swollen glands on left side of neck yesterday.

## 2009-08-18 MED ORDER — FLUCONAZOLE 150 MG TAB
150 mg | ORAL_TABLET | Freq: Every day | ORAL | Status: AC
Start: 2009-08-18 — End: 2009-08-19

## 2009-08-18 NOTE — Telephone Encounter (Signed)
[phone] Patient has developed a yeast infection from the Zpack she's on for her cough --- no discharge, just intense itching. Wondering if Dr. Maurine Minister would just call in something for that please!

## 2009-09-03 MED ORDER — BLOOD SUGAR DIAGNOSTIC TEST STRIPS
PACK | Freq: Two times a day (BID) | Status: DC
Start: 2009-09-03 — End: 2009-09-05

## 2009-09-05 ENCOUNTER — Encounter

## 2009-09-05 MED ORDER — BLOOD SUGAR DIAGNOSTIC TEST STRIPS
PACK | Freq: Two times a day (BID) | Status: DC
Start: 2009-09-05 — End: 2010-10-09

## 2009-09-05 MED ORDER — LANCETS
PACK | Status: DC
Start: 2009-09-05 — End: 2009-11-08

## 2009-10-28 ENCOUNTER — Encounter

## 2009-10-28 NOTE — Progress Notes (Signed)
Dear Provider,    Your patient visited the [facility] interactive preventive healthcare   record on 10/28/2009.    Below is a summary of your patient's information. Thank you for encouraging   your patients to  use [facility]!       INFORMATION YOUR PATIENT UPDATED:  Your patient did not update any information.      CURRENT HEALTH BEHAVIORS:  Your patient eats unknown serving(s) of fruits and vegetables per day.  Your patient exercises unknown time(s) per week.  Your patient does not smoke.  Your patient's body mass index (BMI) is 31.      CARE YOUR PATIENT MAY NEED:  Cervical cancer screening: No record of a pap smear  Hyperlipidemia follow-up: Has coronary artery disease or an equilavent risk,   last LDL 103.8 on 04/15/2007  Aspirin counseling: Has risks for heart disease, does not take aspirin  Tetanus vaccine: No record of a tetanus vaccine

## 2009-11-08 MED ORDER — LANCETS
PACK | Status: DC
Start: 2009-11-08 — End: 2012-11-03

## 2009-11-08 NOTE — Progress Notes (Signed)
HISTORY OF PRESENT ILLNESS  Katrina [name] is a 44 y.o. female here for a follow up visit.     HPI 1. Diabetes:  Ms. Hiltunen blood sugars at home have been pretty good; the high was 139, but otherwise 100-121 fasting.  No hyper- or hypoglycemic symptoms.  No numbness or tingling.  Reports compliance with diet and medications. Her last eye exam was two weeks ago.  She had a flu shot 10/18.  She had her fasting labs    2. Hypertension:  The patient has been compliant with medications.  She has been cutting down since her adrenal surgery. There have been no known side effects.  The patient denies chest pain, dyspnea, lower extremity swelling. She will see Dr. Venetia Maxon again in December.      3. Hyperlipidemia:  The patient has been compliant with medication.  No known side effects.  Denies muscle aches.    4. Hypothyroidism:  The patient reports compliance with medication.  No hot/cold intolerance.  No change in bowel habits.  No palpitations or fatigue. She feels she has just begun to mourn the loss of her mother, may want to see a counselor. She is taking Xanax daily for a few months.         Current outpatient prescriptions   Medication Sig   ??? nebivolol (BYSTOLIC) 5 mg tablet Take 5 mg by mouth daily.   ??? Lancets Misc by Does Not Apply route. Dx. 250.00 fluctuating blood sugars   ??? glucose blood VI test strips (ASCENSIA AUTODISC VI, ONE TOUCH ULTRA TEST VI) strip by Does Not Apply route two (2) times a day. ONE TOUCH ULTRA 2  Dx. 250.00, fluctuating blood sugars.   ??? rosuvastatin (CRESTOR) 20 mg tablet Take 1 Tab by mouth daily.   ??? FERROUS SULFATE (SLOW FE PO) Take  by mouth.   ??? docusate sodium (COLACE) 100 mg capsule Take 100 mg by mouth two (2) times a day.   ??? lansoprazole (PREVACID SOLUTAB) 30 mg disintegrating tablet    ??? escitalopram (LEXAPRO) 20 mg tablet Take 1 Tab by mouth daily.   ??? ascorbic acid (VITAMIN C) 500 mg tablet Take  by mouth.    ??? vitamin E (AQUA GEMS) 400 unit capsule Take 400 Units by mouth two (2) times a day.   ??? amlodipine-benazepril (LOTREL) 10-40 mg per capsule Take 1 Cap by mouth daily.   ??? levothyroxine (LEVOXYL) 88 mcg tablet Take 1 Tab by mouth daily.   ??? metformin (GLUCOPHAGE) 500 mg tablet Take 2 Tabs by mouth two (2) times daily (with meals).   ??? alprazolam (XANAX) 0.5 mg tablet Take 1 Tab by mouth nightly as needed for Sleep and Anxiety.   ??? FERROUS FUMARATE/VIT BCOMP&C (SUPER B COMPLEX PO) take  by mouth.          Allergies   Allergen Reactions   ??? Codeine Rash and Itching   ??? Aldactone (Spironolactone) Swelling          ROS see HPI    BP 124/84   Pulse 76   Ht 5\' 5"  (1.651 m)   Wt 196 lb (88.905 kg)   BMI 32.62 kg/m2   LMP 10/21/2009     Physical Exam   Vitals reviewed.  Constitutional: She appears well-developed and well-nourished. No distress.   Neck: No mass and no thyromegaly present.   Cardiovascular: Normal rate, regular rhythm, normal heart sounds and intact distal pulses.  Exam reveals no gallop and no friction  rub.    No murmur heard.  Pulses:       Radial pulses are 2+ on the right side, and 2+ on the left side.        Dorsalis pedis pulses are 2+ on the right side, and 2+ on the left side.        Posterior tibial pulses are 2+ on the right side, and 2+ on the left side.        No peripheral edema noted   Pulmonary/Chest: Effort normal and breath sounds normal. She has no wheezes. She has no rales.   Psychiatric: She exhibits a depressed mood (tearful at times).       ASSESSMENT and PLAN    Diabetes mellitus--Good home glucose readings. I will follow up on her labs, already ordered. A1C, BMP, lipid panel, micro albumin. She will continue current medications. Pneumovax is up to date. Annual eye exam recommended for next year.     Hyperlipidemia--Unknown control.  Will check labs and continue the current dose of medication pending results.      Grief--She does have underlying depression, but has super-imposed grief after the death of her mother. She was given a list of grief counselors. I may consider changing her medication from Lexapro, perhaps Wellbutrin, if she is not improving.    Hypothyroidism--Clinically euthyroid. Labs at follow up.  Continue current dose.     Essential hypertension, benign--Controlled. Followed by nephrology. She will continue medications as directed. BMP ordered.         Follow-up Disposition:  Return in about 6 months (around 05/09/2010) for diabetes, cholesterol, thyroid.

## 2009-11-08 NOTE — Progress Notes (Signed)
Pt here today for her 2 month follow up.

## 2009-11-09 LAB — LIPID PANEL WITH LDL/HDL RATIO
Cholesterol, total: 176 mg/dL (ref 100–199)
HDL Cholesterol: 47 mg/dL (ref >39)
LDL, calculated: 104 mg/dL — ABNORMAL HIGH (ref 0–99)
LDL/HDL Ratio: 2.2 ratio units (ref 0.0–3.2)
Triglyceride: 124 mg/dL (ref 0–149)
VLDL, calculated: 25 mg/dL (ref 5–40)

## 2009-11-09 LAB — MICROALBUMIN, UR, RAND W/ MICROALB/CREAT RATIO
Creatinine, urine random: 108.9 mg/dL (ref 15.0–278.0)
Microalb/Creat ratio (ug/mg creat.): 4.4 mg/g creat (ref 0.0–30.0)
Microalbumin, urine: 4.8 ug/mL (ref 0.0–17.0)

## 2009-11-09 LAB — AST: AST (SGOT): 26 IU/L (ref 0–40)

## 2009-11-09 LAB — ALT: ALT (SGPT): 31 IU/L (ref 0–40)

## 2009-11-09 LAB — HEMOGLOBIN A1C WITH EAG: Hemoglobin A1c: 5.8 % — ABNORMAL HIGH (ref 4.8–5.6)

## 2009-12-12 MED ORDER — LISINOPRIL 10 MG TAB
10 mg | ORAL_TABLET | Freq: Every day | ORAL | Status: DC
Start: 2009-12-12 — End: 2010-01-17

## 2009-12-12 NOTE — Telephone Encounter (Signed)
Pt called with c/o chest pain, feels like bricks on her chest, denies shortness of breath.  Just doesn't feel right.  Started feeling bad over the weekend.  Pressure has not let up today.   Appt wit Dr Madilyn Fireman.

## 2009-12-12 NOTE — Progress Notes (Signed)
HISTORY OF PRESENT ILLNESS  Katrina [name] is a 44 y.o. female.  Chest Pain (Angina)   This is a recurrent problem. The current episode started 2 days ago. The problem has not changed since onset. The problem occurs constantly (There have been times that the pain has become more severe, but it is short lived, and not associated w/ activity.  There is no change with activity). Pain location: some pain SS and also having bilateral "rib pain", wrapping around from the front to the back & is a dull ache. Quality: SS pain feels like "bricks on her chest", flank/rib pain is sharp. Associated symptoms include malaise/fatigue and shortness of breath (only when the pressure gets worse). Pertinent negatives include no irregular heartbeat, no nausea and no vomiting. Denies GERD sx or abd pain. She has tried nothing for the symptoms. Risk factors include diabetes mellitus, hypertension and obesity.   She denies LE edema or prolonged immobilization.  No cough or sputum production.  July 2011 had a stress test, was non-diagnostic but echo component looked good.  Quit smoking 10 months ago.  Has gained 15 lbs in last few months.      Patient Active Problem List   Diagnoses Date Noted   ??? Hypokalemia [276.8A] 07/13/2008   ??? Essential Hypertension, Benign [401.1] 03/19/2008   ??? Diabetes Mellitus [250.00A] 12/19/2007   ??? S/P Tonsillectomy [V45.89LH] 07/25/2007   ??? S/P Appendectomy [V45.89DV] 07/25/2007   ??? Obesity [278.6M] 07/25/2007   ??? Hyperlipidemia [272.4S] 07/25/2007   ??? Hypothyroidism [244.9AP] 07/25/2007   ??? GE Reflux [530.81AA] 07/25/2007   ??? Depression [311L] 07/25/2007   ??? Fatty Liver [571.8D] 07/25/2007   ??? Elevated Triglycerides with High Cholesterol [272.2DA] 07/25/2007       Current outpatient prescriptions   Medication Sig Dispense Refill   ??? nebivolol (BYSTOLIC) 5 mg tablet Take 2.5 mg by mouth daily.        ??? Lancets (ONE TOUCH DELICA) Misc by Does Not Apply route. As directed.  Indications: One Touch Ultra 2 Lancets (Delica)  3 Package  3   ??? glucose blood VI test strips (ASCENSIA AUTODISC VI, ONE TOUCH ULTRA TEST VI) strip by Does Not Apply route two (2) times a day. ONE TOUCH ULTRA 2  Dx. 250.00, fluctuating blood sugars.  3 Package  3   ??? rosuvastatin (CRESTOR) 20 mg tablet Take 1 Tab by mouth daily.  30 Tab  3   ??? FERROUS SULFATE (SLOW FE PO) Take  by mouth.       ??? docusate sodium (COLACE) 100 mg capsule Take 100 mg by mouth two (2) times a day.       ??? lansoprazole (PREVACID SOLUTAB) 30 mg disintegrating tablet        ??? escitalopram (LEXAPRO) 20 mg tablet Take 1 Tab by mouth daily.  90 Tab  3   ??? ascorbic acid (VITAMIN C) 500 mg tablet Take  by mouth.       ??? vitamin E (AQUA GEMS) 400 unit capsule Take 400 Units by mouth two (2) times a day.       ??? amlodipine-benazepril (LOTREL) 10-40 mg per capsule Take 1 Cap by mouth daily.  90 Cap  3   ??? levothyroxine (LEVOXYL) 88 mcg tablet Take 1 Tab by mouth daily.  90 Tab  3   ??? metformin (GLUCOPHAGE) 500 mg tablet Take 2 Tabs by mouth two (2) times daily (with meals).  360 Tab  3   ??? alprazolam Prudy Feeler)  0.5 mg tablet Take 1 Tab by mouth nightly as needed for Sleep and Anxiety.  90 Tab  1   ??? FERROUS FUMARATE/VIT BCOMP&C (SUPER B COMPLEX PO) take  by mouth.           Allergies   Allergen Reactions   ??? Codeine Rash and Itching   ??? Aldactone (Spironolactone) Swelling             Review of Systems   Constitutional: Positive for malaise/fatigue.   Respiratory: Positive for shortness of breath (only when the pressure gets worse).    Cardiovascular: Positive for chest pain.   Gastrointestinal: Negative for nausea and vomiting.   Musculoskeletal: Negative for myalgias.       Physical Exam   Vitals reviewed.  Constitutional: No distress.   HENT:   Mouth/Throat: Mucous membranes are normal.   Eyes: Conjunctivae and lids are normal. No scleral icterus.    Neck: Neck supple. No thyromegaly present.   Cardiovascular: Regular rhythm and normal heart sounds.    No murmur heard.  Pulmonary/Chest: Effort normal and breath sounds normal. She has no wheezes. She has no rales. She exhibits no tenderness.   Abdominal: Soft. Bowel sounds are normal. She exhibits no mass. There is no hepatosplenomegaly. No tenderness.   Musculoskeletal: She exhibits no edema.   Lymphadenopathy:     She has no cervical adenopathy.   Skin: No rash noted.   Psychiatric: She has a normal mood and affect. Her behavior is normal.     BP 124/88   Pulse 75   Temp(Src) 98.8 ??F (37.1 ??C) (Oral)   Ht 5\' 5"  (1.651 m)   Wt 201 lb 9.6 oz (91.445 kg)   BMI 33.55 kg/m2   SpO2 98%      ASSESSMENT and PLAN  [name] was seen today for other and other.  Diagnoses and associated orders for this visit:    Chest pain- unclear if cardiac or pulm related, doubt msk or GI related.  Will have her see cardiology as stress test was inconclusive., EKG with non-specific changes, and has multiple risk factors.  Will check labs & CXR.  Red flags d/w pt.   - AMB POC EKG ROUTINE W/ 12 LEADS, INTER & REP  - XR CHEST PA AND LATERAL; Future  - METABOLIC PANEL, BASIC  - CBC W/O DIFF  - REFERRAL TO CARDIOLOGY        Follow-up Disposition: Not on File   Advised her to call back or return to office if symptoms worsen/change/persist.  Discussed expected course/resolution/complications of diagnosis in detail with patient.    Medication risks/benefits/costs/interactions/alternatives discussed with patient.  She was given an after visit summary which includes her diagnoses, current medications & vitals.  She expressed understanding with the diagnosis and plan.

## 2009-12-12 NOTE — Progress Notes (Signed)
HISTORY OF PRESENT ILLNESS  Katrina [name] is a 44 y.o. female.She is referred for evaluation of chest pain by Dr.  Madilyn Fireman.  She has diabetes since 2005.  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  This was removed in July. Her blood pressure medications have been reduced to some degree since.  She normally works out three to four times a week at [employer] but has not done so for the past week.  She has had no energy.  She feels sluggish and fatigued.  Her legs are tired.  She saw Dr. Venetia Maxon recently and he has been reducing her blood pressure medicine since some of her systolic readings have been less than 100.  This morning she had chest discomfort characterized as a pressure.  It became severe later in the morning.  It was associated with some sharp occasional pains in the substernal area.  She also has achiness and soreness around her ribs to her back under her breasts.  She has never had this before.  Over the past several weeks she has also been retaining fluid and feels puffy.  She is treated for hypercholesterolemia.  She previously smoked cigarettes but quit in February of this year.  Her family history is significant for coronary disease.  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    MedDATA/kvd          HPI  Patient Active Problem List   Diagnoses Code   ??? S/P Tonsillectomy V45.89LH   ??? S/P Appendectomy V45.89DV   ??? Obesity 278.63M   ??? Hyperlipidemia 272.4S   ??? Hypothyroidism 244.9AP   ??? GE Reflux 530.81AA   ??? Depression 311L   ??? Fatty Liver 571.8D   ??? Elevated Triglycerides with High Cholesterol 272.2DA   ??? Diabetes Mellitus 250.00A   ??? Essential Hypertension, Benign 401.1   ??? Hypokalemia 276.8A   ??? Chest pain, unspecified 786.50   ??? Abnormal EKG 794.31S   ??? Adrenal adenoma 227.0L   ??? Hypotension (arterial) 458.11M       Current outpatient prescriptions    Medication Sig Dispense Refill   ??? nebivolol (BYSTOLIC) 10 mg tablet Take 5 mg by mouth daily.       ??? lisinopril (PRINIVIL, ZESTRIL) 10 mg tablet Take 1 Tab by mouth daily.  30 Tab  11   ??? Lancets (ONE TOUCH DELICA) Misc by Does Not Apply route. As directed.  Indications: One Touch Ultra 2 Lancets (Delica)  3 Package  3   ??? glucose blood VI test strips (ASCENSIA AUTODISC VI, ONE TOUCH ULTRA TEST VI) strip by Does Not Apply route two (2) times a day. ONE TOUCH ULTRA 2  Dx. 250.00, fluctuating blood sugars.  3 Package  3   ??? rosuvastatin (CRESTOR) 20 mg tablet Take 1 Tab by mouth daily.  30 Tab  3   ??? FERROUS SULFATE (SLOW FE PO) Take  by mouth.       ??? docusate sodium (COLACE) 100 mg capsule Take 100 mg by mouth two (2) times a day.       ??? lansoprazole (PREVACID SOLUTAB) 30 mg disintegrating tablet        ??? escitalopram (LEXAPRO) 20 mg tablet Take 1 Tab by mouth daily.  90 Tab  3   ??? ascorbic acid (VITAMIN C) 500 mg tablet Take  by mouth.       ???  vitamin E (AQUA GEMS) 400 unit capsule Take 400 Units by mouth two (2) times a day.       ??? levothyroxine (LEVOXYL) 88 mcg tablet Take 1 Tab by mouth daily.  90 Tab  3   ??? metformin (GLUCOPHAGE) 500 mg tablet Take 2 Tabs by mouth two (2) times daily (with meals).  360 Tab  3   ??? alprazolam (XANAX) 0.5 mg tablet Take 1 Tab by mouth nightly as needed for Sleep and Anxiety.  90 Tab  1   ??? FERROUS FUMARATE/VIT BCOMP&C (SUPER B COMPLEX PO) take  by mouth.           Past Medical History   Diagnosis Date   ??? S/P tonsillectomy 07/25/2007   ??? S/P appendectomy 07/25/2007   ??? Obesity 07/25/2007   ??? Hyperlipidemia 07/25/2007   ??? Fatty liver 07/25/2007   ??? Elevated triglycerides with high cholesterol 07/25/2007   ??? Depression 07/25/2007   ??? GE reflux 07/25/2007   ??? HTN 07/25/2007   ??? Hypothyroidism 07/25/2007       Past Surgical History   Procedure Date   ??? Appendectomy    ??? Hx tonsillectomy    ??? Hx breast reduction    ??? Hx cholecystectomy    ??? Abdomen surgery proc unlisted 08/01/09      laprascopic right adrenalectomy           Review of Systems   Constitutional: Positive for malaise/fatigue.   HENT: Negative.    Eyes: Negative.    Respiratory: Negative.    Cardiovascular: Positive for chest pain and leg swelling.   Gastrointestinal: Negative.    Musculoskeletal: Positive for myalgias.   Skin: Negative.    Neurological: Positive for weakness.   Endo/Heme/Allergies: Negative.    Psychiatric/Behavioral: Negative.        Physical Exam   Nursing note and vitals reviewed.  Constitutional: She is oriented to person, place, and time. She appears well-nourished.   HENT:   Head: Atraumatic.   Eyes: Conjunctivae are normal. No scleral icterus.   Neck: Neck supple. No thyromegaly present.   Cardiovascular: Normal rate, regular rhythm, normal heart sounds and intact distal pulses.  Exam reveals no gallop and no friction rub.    No murmur heard.  Pulmonary/Chest: Breath sounds normal. She has no wheezes. She has no rales. She exhibits tenderness.   Abdominal: Bowel sounds are normal. She exhibits no distension. No tenderness. She has no rebound.   Musculoskeletal: She exhibits no edema.   Neurological: She is oriented to person, place, and time. No cranial nerve deficit.   Skin: Skin is dry.   Psychiatric: She has a normal mood and affect.       ASSESSMENT and PLAN   She does have some point tenderness to exam at the left costochondral junction 3rd intercostal space.  However her EKG is slightly abnormal with some nonspecific ST-T wave abnormalities.  I compared it one done in June and it does not appear to have significantly changed.  Nevertheless given her diabetes and smoking history as well as her family history and hypercholesterolemia I think repeating her stress echocardiogram is necessary.  This will be done in the office today. If it is negative then I will suggest that her blood pressure regimen be tapered. I would stop her Lotrel completely and give her 10 mg of Lisinopril to take once a day.  She can follow up with Dr. Maurine Minister and Dr. Venetia Maxon in this regard.  I suspect that  she does not really need much blood pressure medication going forward.  She could also take a nonsteroidal medication as needed for chest pain if her stress test is negative.    MedDATA/kvd          A stress echocardiogram was done in the office and she was able to walk 9 minutes and 45 seconds before stopping due to fatigue.  She had no chest pain.  Her heart rate rose to 83% of her age predicated maximal heart rate.  There were no EKG changes and no wall motion abnormalities by echo to suggest ischemia. This is a negative stress echocardiogram result.  She was reassured regarding these findings.  I told her to take nonsteroidal medication over-the-counter as needed for what appears to chest wall pain.  She will reduce her blood pressure medications as noted above and will follow up with Dr. Maurine Minister or Dr.  Madilyn Fireman in this regard.  I will be glad to see her again as needed.     MedDATA/kvd

## 2009-12-12 NOTE — Patient Instructions (Signed)
Appointment with Dr. Sheliah Hatch at [facility] [address] [facility]. [address], [place], [place]  Phone # [phone]  at 1:20 pm today.

## 2009-12-12 NOTE — Progress Notes (Signed)
Quick Note:    MyChart message sent. CXR nl.  ______

## 2009-12-12 NOTE — Progress Notes (Signed)
Patient has been having chest pressure over the weekend. Today more than over weekend. SOB and legs feel heavy. Swelling in hands and feet.

## 2009-12-13 LAB — CBC W/O DIFF
HCT: 42 % (ref 34.0–44.0)
HGB: 13.6 g/dL (ref 11.5–15.0)
MCH: 27.9 pg (ref 27.0–34.0)
MCHC: 32.4 g/dL (ref 32.0–36.0)
MCV: 86 fL (ref 80–98)
PLATELET: 326 10*3/uL (ref 140–415)
RBC: 4.87 x10E6/uL (ref 3.80–5.10)
RDW: 14.3 % (ref 11.7–15.0)
WBC: 10 10*3/uL (ref 4.0–10.5)

## 2009-12-13 LAB — METABOLIC PANEL, BASIC
BUN/Creatinine ratio: 27 — ABNORMAL HIGH (ref 9–23)
BUN: 16 mg/dL (ref 6–24)
CO2: 22 mmol/L (ref 20–32)
Calcium: 9.9 mg/dL (ref 8.7–10.2)
Chloride: 105 mmol/L (ref 97–108)
Creatinine: 0.59 mg/dL (ref 0.57–1.00)
GFR est AA: 131 mL/min/{1.73_m2} (ref >59)
GFR est non-AA: 113 mL/min/{1.73_m2} (ref >59)
Glucose: 96 mg/dL (ref 65–99)
Potassium: 5.2 mmol/L (ref 3.5–5.2)
Sodium: 138 mmol/L (ref 135–145)

## 2009-12-13 NOTE — Progress Notes (Addendum)
Quick Note:    MyChart message sent. All labs are normal.  ______

## 2009-12-13 NOTE — Progress Notes (Signed)
See scanned report for results.

## 2009-12-28 ENCOUNTER — Encounter

## 2009-12-28 MED ORDER — ALPRAZOLAM 0.5 MG TAB
0.5 mg | ORAL_TABLET | Freq: Every evening | ORAL | Status: DC | PRN
Start: 2009-12-28 — End: 2010-10-24

## 2010-01-17 MED ORDER — LISINOPRIL 10 MG TAB
10 mg | ORAL_TABLET | Freq: Every day | ORAL | Status: DC
Start: 2010-01-17 — End: 2011-01-08

## 2010-02-01 MED ORDER — NEBIVOLOL 5 MG TAB
5 mg | ORAL_TABLET | Freq: Every day | ORAL | Status: DC
Start: 2010-02-01 — End: 2010-05-30

## 2010-02-01 MED ORDER — NEBIVOLOL 5 MG TAB
5 mg | ORAL_TABLET | Freq: Every day | ORAL | Status: DC
Start: 2010-02-01 — End: 2010-02-01

## 2010-02-08 ENCOUNTER — Encounter

## 2010-02-08 MED ORDER — LEVOTHYROXINE 88 MCG TAB
88 mcg | ORAL_TABLET | Freq: Every day | ORAL | Status: DC
Start: 2010-02-08 — End: 2011-01-08

## 2010-03-27 NOTE — Progress Notes (Signed)
HISTORY OF PRESENT ILLNESS  Katrina [name] is a 45 y.o. female here for an acute visit.     HPI 1. Leg pain: Katrina [name] has been feeling fatigued, struggling with this for the past three months. She is always tired when she wakes up, despite going to be early, denies snoring, apnea.  By the end of the day she is exhausted. She has been trying to exercise as much as can. Her blood pressure has been good--not too low; her blood sugar has been good. She is not having chest pain, palpitations. She also feels her legs are tired, throbbing with exercise sometimes. She did see Dr. Madilyn Fireman in December with complaints of fatigue, leg heaviness, and chest pain. Her CBC and BMP were normal; she was also sent for a stress test and CXR. The results were normal, and she ended up stopping her Lotrel with resolution of symptoms. She did have labs on 03/16/10 with Dr. Rose Fillers. For three weeks, she has had pain in her left hand, the thumb gets stuck, pops.       Current Outpatient Prescriptions   Medication Sig   ??? b complex vitamins tablet Take 1 Tab by mouth daily.     ??? levothyroxine (LEVOXYL) 88 mcg tablet Take 1 Tab by mouth daily.   ??? nebivolol (BYSTOLIC) 5 mg tablet Take 1 Tab by mouth daily.   ??? lisinopril (PRINIVIL, ZESTRIL) 10 mg tablet Take 1 Tab by mouth daily.   ??? ALPRAZolam (XANAX) 0.5 mg tablet Take 1 Tab by mouth nightly as needed for Sleep and Anxiety.   ??? Lancets (ONE TOUCH DELICA) Misc by Does Not Apply route. As directed.  Indications: One Touch Ultra 2 Lancets (Delica)   ??? glucose blood VI test strips (ASCENSIA AUTODISC VI, ONE TOUCH ULTRA TEST VI) strip by Does Not Apply route two (2) times a day. ONE TOUCH ULTRA 2  Dx. 250.00, fluctuating blood sugars.   ??? rosuvastatin (CRESTOR) 20 mg tablet Take 1 Tab by mouth daily.   ??? lansoprazole (PREVACID SOLUTAB) 30 mg disintegrating tablet    ??? escitalopram (LEXAPRO) 20 mg tablet Take 1 Tab by mouth daily.    ??? ascorbic acid (VITAMIN C) 500 mg tablet Take  by mouth.   ??? vitamin E (AQUA GEMS) 400 unit capsule Take 400 Units by mouth two (2) times a day.   ??? metformin (GLUCOPHAGE) 500 mg tablet Take 2 Tabs by mouth two (2) times daily (with meals).   ??? FERROUS FUMARATE/VIT BCOMP&C (SUPER B COMPLEX PO) take  by mouth.          Allergies   Allergen Reactions   ??? Aldactone (Spironolactone) Swelling   ??? Codeine Rash and Itching        ROS see HPI    BP 126/82   Pulse 76   Ht 5\' 5"  (1.651 m)   Wt 205 lb 3.2 oz (93.078 kg)   BMI 34.15 kg/m2   LMP 03/18/2010   Weight was 201 in December.    Physical Exam   Vitals reviewed.  Constitutional: She appears well-developed and well-nourished. No distress.   Cardiovascular: Normal rate, regular rhythm, normal heart sounds and intact distal pulses.  Exam reveals no gallop and no friction rub.    No murmur heard.  Pulses:       Radial pulses are 2+ on the right side, and 2+ on the left side.        Dorsalis pedis pulses are 2+ on the right  side, and 2+ on the left side.        Posterior tibial pulses are 2+ on the right side, and 2+ on the left side.        No peripheral edema noted   Pulmonary/Chest: Effort normal and breath sounds normal. She has no wheezes. She has no rales.   Musculoskeletal:        Left hand: She exhibits decreased range of motion (decreased flexion at IP joint 1st digit) and tenderness (1st digit, flexor tendon). She exhibits no bony tenderness and no swelling.   Skin: Skin is warm, dry and intact. No rash noted. There is erythema (medial legs--sunburned this weekend).       ASSESSMENT and PLAN    Hypothyroidism--Unknown control, given her symptoms. I will assess labs now, adjust medication as indicated.  - TSH, 3RD GENERATION; Future  - T4, FREE; Future    Myalgia--No sign of arterial vascular disease. She will have labs today, and may hold the Crestor to assess for decreased symptoms.   - METABOLIC PANEL, BASIC; Future  - CK; Future  -     TSH, FREE; Future     Fatigue--She has had a sleep evaluation that was equivocal last year. It did show non-restorative sleep, multiple awakenings. Now she has her same morning sleepiness and daytime sleepiness. I will consider trial off her Crestor and referral for a follow up sleep study depending on lab results today. Labs from her hepatologist done this month to be faxed to me. She will continue seeing her therapist for depression, may need to adjust Lexapro.    Trigger finger  - REFERRAL TO ORTHOPEDIC SURGERY for evaluation.        Follow-up Disposition:  Return depending on results--May as scheduled.

## 2010-03-27 NOTE — Progress Notes (Signed)
Pt here today with complaints of "legs feeling tired." Also having feelings of fatigue and concerns about her weight gain.

## 2010-03-27 NOTE — Patient Instructions (Signed)
Trigger Finger: After Your Visit  Your Care Instructions  When one of your fingers gets stuck in the bent position, it is called a trigger finger. Usually a trigger finger will then straighten out on its own. Trigger fingers seem to occur more in people who have diabetes or arthritis or who have injured their hands in the past. Trigger finger also occurs in musicians and people who grip tools repeatedly. While a trigger finger can be painful, it normally is not a serious problem.  Home treatment, including rest and exercises, may help your trigger finger relax so that it can bend normally. You may get a corticosteroid shot to reduce swelling and pain. Your doctor may put a splint on your finger. This will give your finger some rest and avoid irritating the joint. You may need surgery if the finger continues to lock in a bent position.  Follow-up care is a key part of your treatment and safety. Be sure to make and go to all appointments, and call your doctor if you are having problems. It???s also a good idea to know your test results and keep a list of the medicines you take.  How can you care for yourself at home?  ?? If your doctor put a splint on your finger, wear the splint as directed. Do not remove it until your doctor says you can.   ?? You may need to change your activities to avoid movements that irritate the finger.   ?? If your finger is swollen, put ice or a cold pack on your finger for 10 to 20 minutes at a time. Try to do this every 1 to 2 hours for the next 3 days (when you are awake) or until the swelling goes down. Put a thin cloth between the ice and your skin.   ?? Prop up your hand on a pillow when you ice it or anytime you sit or lie down during the next 3 days. Try to keep it above the level of your heart. This will help reduce swelling.   ?? Take your medicines exactly as prescribed. Call your doctor if you think you are having a problem with your medicine.    ?? Take anti-inflammatory medicines to reduce pain and swelling. These include ibuprofen (Advil, Motrin) and naproxen (Aleve). Read and follow all instructions on the label.   ?? If your doctor recommends exercises, do them as directed.   When should you call for help?  Call your doctor now or seek immediate medical care if:  ?? Your finger locks in a bent position and will not straighten.   Watch closely for changes in your health, and be sure to contact your doctor if:  ?? You do not get better as expected.     Where can you learn more?    Go to http://www.healthwise.net/[facility]   Enter M826 in the search box to learn more about "Trigger Finger: After Your Visit."    ?? 2006-2012 [employer]. Care instructions adapted under license by [employer] (which disclaims liability or warranty for this information). This care instruction is for use with your licensed healthcare professional. If you have questions about a medical condition or this instruction, always ask your healthcare professional. Healthwise, Incorporated disclaims any warranty or liability for your use of this information.  Content Version: 9.2.102713; Last Revised: October 14, 2008

## 2010-03-28 LAB — METABOLIC PANEL, BASIC
BUN/Creatinine ratio: 18 (ref 9–23)
BUN: 10 mg/dL (ref 6–24)
CO2: 25 mmol/L (ref 20–32)
Calcium: 9.3 mg/dL (ref 8.7–10.2)
Chloride: 108 mmol/L (ref 97–108)
Creatinine: 0.55 mg/dL — ABNORMAL LOW (ref 0.57–1.00)
GFR est AA: 134 mL/min/{1.73_m2} (ref >59)
GFR est non-AA: 116 mL/min/{1.73_m2} (ref >59)
Glucose: 90 mg/dL (ref 65–99)
Potassium: 4.8 mmol/L (ref 3.5–5.2)
Sodium: 142 mmol/L (ref 134–144)

## 2010-03-28 LAB — T4, FREE: T4, Free: 1.36 ng/dL (ref 0.82–1.77)

## 2010-03-28 LAB — TSH 3RD GENERATION: TSH: 1.69 u[IU]/mL (ref 0.450–4.500)

## 2010-03-28 LAB — CK: Creatine Kinase,Total: 77 U/L (ref 24–173)

## 2010-03-29 ENCOUNTER — Encounter

## 2010-03-29 MED ORDER — ESCITALOPRAM 20 MG TAB
20 mg | ORAL_TABLET | Freq: Every day | ORAL | Status: DC
Start: 2010-03-29 — End: 2010-05-30

## 2010-03-29 MED ORDER — METFORMIN 500 MG TAB
500 mg | ORAL_TABLET | Freq: Two times a day (BID) | ORAL | Status: DC
Start: 2010-03-29 — End: 2011-01-08

## 2010-03-29 NOTE — Telephone Encounter (Signed)
From: Tirrell,Tamryn M   To: Maricela Curet, MD   Sent: Wed Mar 29, 2010 7:41 AM   Subject: Medication Renewal Request    Original authorizing provider: TIFFANI Arman Bogus, MD    Gertha Calkin would like a refill of the following medications:  metformin (GLUCOPHAGE) 500 mg tablet [TIFFANI D DENNIS, MD]    Preferred pharmacy: [facility]    Comment:  Lexapro 20 mg [facility]  Other - see comments for explanation

## 2010-04-25 ENCOUNTER — Encounter

## 2010-04-26 MED ORDER — PRAVASTATIN 40 MG TAB
40 mg | ORAL_TABLET | Freq: Every day | ORAL | Status: DC
Start: 2010-04-26 — End: 2010-05-30

## 2010-05-30 MED ORDER — ESCITALOPRAM 10 MG TAB
10 mg | ORAL_TABLET | Freq: Every day | ORAL | Status: DC
Start: 2010-05-30 — End: 2010-10-24

## 2010-05-30 NOTE — Progress Notes (Signed)
HISTORY OF PRESENT ILLNESS  Katrina [name] is a 45 y.o. female here for a follow up visit.     HPI 1. Diabetes:  Katrina [name] blood sugars at home have been good--77-low 100s. She has no hyper- or hypoglycemic symptoms.  She has no numbness or tingling.  She reports compliance with diet and medications.     2. Hyperlipidemia:   Katrina [name] has been off her cholesterol medication; she had a lot of leg aching so stopped the pravastatin. She is still having joint aches in hips and knees, her legs still hurt with going up the stairs. Her legs may be better off the medication. She also has mid back pain on a daily basis. She takes nothing for it, just tries to stretch. It is like a knife in her mid back.     3. Fatigue: She is still feeling her sleep is not good, because she wakes not feeling rested. She is off the Bystolic since April 18th, but is not feeling better. Since April, her blood pressure has been 130-low140s/80s. She was noted to have fragmented sleep on her sleep study. She was advised to go back in one year to follow up on her sleep study. Her mood is better. She has been seeing a Veterinary surgeon and feels she is doing really well with this. She would like to get off the Lexapro.      Current Outpatient Prescriptions   Medication Sig   ??? lansoprazole (PREVACID) 30 mg capsule Take  by mouth Daily (before breakfast).     ??? cholecalciferol, vitamin D3, (VITAMIN D3) 2,000 unit Tab Take  by mouth.     ??? escitalopram (LEXAPRO) 20 mg tablet Take 1 Tab by mouth daily.   ??? metFORMIN (GLUCOPHAGE) 500 mg tablet Take 2 Tabs by mouth two (2) times daily (with meals).   ??? b complex vitamins tablet Take 1 Tab by mouth daily.     ??? levothyroxine (LEVOXYL) 88 mcg tablet Take 1 Tab by mouth daily.   ??? lisinopril (PRINIVIL, ZESTRIL) 10 mg tablet Take 1 Tab by mouth daily.   ??? ALPRAZolam (XANAX) 0.5 mg tablet Take 1 Tab by mouth nightly as needed for Sleep and Anxiety.    ??? Lancets (ONE TOUCH DELICA) Misc by Does Not Apply route. As directed.  Indications: One Touch Ultra 2 Lancets (Delica)   ??? glucose blood VI test strips (ASCENSIA AUTODISC VI, ONE TOUCH ULTRA TEST VI) strip by Does Not Apply route two (2) times a day. ONE TOUCH ULTRA 2  Dx. 250.00, fluctuating blood sugars.   ??? ascorbic acid (VITAMIN C) 500 mg tablet Take  by mouth.   ??? vitamin E (AQUA GEMS) 400 unit capsule Take 400 Units by mouth two (2) times a day.        Allergies   Allergen Reactions   ??? Aldactone (Spironolactone) Swelling   ??? Codeine Rash and Itching        ROS see HPI    Filed Vitals:    05/30/10 0944 05/30/10 1003   BP: 144/92 135/86   Pulse: 88    Temp: 98.6 ??F (37 ??C)    Resp: 16    Height: 5\' 5"  (1.651 m)    Weight: 92.806 kg (204 lb 9.6 oz)       Body mass index is 34.05 kg/(m^2).     Physical Exam   [vitalsreviewed.  Constitutional: She appears well-developed and well-nourished. No distress.   Cardiovascular: Normal rate, regular rhythm, normal  heart sounds and intact distal pulses.  Exam reveals no gallop and no friction rub.    No murmur heard.  Pulses:       Radial pulses are 2+ on the right side, and 2+ on the left side.        Dorsalis pedis pulses are 2+ on the right side, and 2+ on the left side.        Posterior tibial pulses are 2+ on the right side, and 2+ on the left side.        No peripheral edema noted   Pulmonary/Chest: Effort normal and breath sounds normal. She has no wheezes. She has no rales.   Musculoskeletal:        Right elbow: no tenderness found.        Left elbow: no tenderness found.        Right knee: She exhibits normal range of motion, no swelling and no effusion. tenderness found. Medial joint line tenderness noted.        Left knee: She exhibits normal range of motion, no swelling and no effusion. tenderness found. Medial joint line tenderness noted.        Cervical back: She exhibits tenderness (upper trazepius).         Thoracic back: She exhibits no tenderness and no bony tenderness.        No synovitis, deformity of hands  No pain with mid-foot squeeze.      Skin: No erythema.       ASSESSMENT and PLAN    Diabetes mellitus--Good control at home. Assess labs today, if still at goal, well controlled, will continue her current metformin, ACE inhibitor. Will re-try statin at follow up.  - HEMOGLOBIN A1C  - METABOLIC PANEL, BASIC    Essential hypertension, benign--Fair control off Bystolic. May need to increase lisinopril. Will continue her current regimen for now. Labs today, follow up in one month.  - METABOLIC PANEL, BASIC    Hyperlipidemia-Untreated for now. She should be back on a statin, will decide at her follow up.    Fatigue--Persistent, possible underlying sleep disorder. Will also consider the Lexapro as contributing. She should be off this medication for a repeat sleep study, but I do not think it wise to stop all therapy for depression. Will try to transition her to Cymbalta, if possible. She will cut down to 10 mg for now and follow up in four weeks.   - escitalopram (LEXAPRO) 10 mg tablet; Take 1 Tab by mouth daily.    Arthralgia--Diffuse, no inflammation noted today. She will have labs today to assess for inflammatory arthritis. Will stay off the statin for now, but may resume as likely unrelated to pain. Cymbalta may help with pain, regardless of cause. Follow up in four weeks.   - SED RATE (ESR)  - CRP, HIGH SENSITIVITY      Back pain--Labs as noted above, consider fibromyalgia, also will assess back with x-rays, given chronicity and consistency of pain.   - XR SPINE THORAC 3 V; Future            Follow-up Disposition:  Return in about 4 weeks (around 06/27/2010) for pain, blood pressure, fatigue.

## 2010-05-31 LAB — SED RATE (ESR): Sed rate (ESR): 2 mm/hr (ref 0–32)

## 2010-05-31 LAB — METABOLIC PANEL, BASIC
BUN/Creatinine ratio: 32 — ABNORMAL HIGH (ref 9–23)
BUN: 18 mg/dL (ref 6–24)
CO2: 22 mmol/L (ref 20–32)
Calcium: 10 mg/dL (ref 8.7–10.2)
Chloride: 103 mmol/L (ref 97–108)
Creatinine: 0.57 mg/dL (ref 0.57–1.00)
GFR est non-AA: 115 mL/min/{1.73_m2} (ref >59)
Glucose: 94 mg/dL (ref 65–99)
Potassium: 4.5 mmol/L (ref 3.5–5.2)
Sodium: 138 mmol/L (ref 134–144)
eGFR If African American: 132 mL/min/{1.73_m2} (ref >59)

## 2010-05-31 LAB — HEMOGLOBIN A1C WITH EAG: Hemoglobin A1c: 6.3 % — ABNORMAL HIGH (ref 4.8–5.6)

## 2010-05-31 LAB — CRP, HIGH SENSITIVITY: C-Reactive Protein, Cardiac: 2.22 mg/L (ref 0.00–3.00)

## 2010-05-31 LAB — VITAMIN D, 25 HYDROXY: VITAMIN D, 25-HYDROXY: 25.2 ng/mL — ABNORMAL LOW (ref 30.0–100.0)

## 2010-05-31 NOTE — Progress Notes (Signed)
Quick Note:    See note on labs.  ______

## 2010-05-31 NOTE — Progress Notes (Signed)
Quick Note:    Please call the patient: Her A1C is a little higher, but still at goal. Her labs do not show increase inflammation. Let's work on cutting down the Lexapro, consider starting Cymbalta and the sleep study. Her back x-ray did show some mild degenerative changes. I can refer her to physical therapy to improve. Vitamin d is mildly low, calcium is fine. Continue current supplement.  ______

## 2010-06-01 NOTE — Progress Notes (Signed)
Quick Note:    Pt advise of results and the need to consider cutting down on the lexapro, and starting Cymbalta. Pt advised to continue current supplement. Pt will call if she decides to make any changes  ______

## 2010-06-06 ENCOUNTER — Encounter

## 2010-07-10 ENCOUNTER — Encounter

## 2010-07-18 LAB — LIPID PANEL WITH LDL/HDL RATIO
Cholesterol, total: 197 mg/dL (ref 100–199)
HDL Cholesterol: 39 mg/dL — ABNORMAL LOW (ref >39)
LDL, calculated: 121 mg/dL — ABNORMAL HIGH (ref 0–99)
LDL/HDL Ratio: 3.1 ratio units (ref 0.0–3.2)
Triglyceride: 186 mg/dL — ABNORMAL HIGH (ref 0–149)
VLDL, calculated: 37 mg/dL (ref 5–40)

## 2010-07-18 LAB — CBC WITH AUTOMATED DIFF
ABS. BASOPHILS: 0 10*3/uL (ref 0.0–0.2)
ABS. EOSINOPHILS: 0.4 10*3/uL (ref 0.0–0.4)
ABS. IMM. GRANS.: 0 10*3/uL (ref 0.0–0.1)
ABS. MONOCYTES: 0.5 10*3/uL (ref 0.1–1.0)
ABS. NEUTROPHILS: 5.1 10*3/uL (ref 1.8–7.8)
Abs Lymphocytes: 2.7 10*3/uL (ref 0.7–4.5)
BASOPHILS: 0 % (ref 0–3)
EOSINOPHILS: 4 % (ref 0–7)
HCT: 40 % (ref 34.0–46.6)
HGB: 12.8 g/dL (ref 11.1–15.9)
IMMATURE GRANULOCYTES: 0 % (ref 0–2)
Lymphocytes: 31 % (ref 14–46)
MCH: 27 pg (ref 26.6–33.0)
MCHC: 32 g/dL (ref 31.5–35.7)
MCV: 84 fL (ref 79–97)
MONOCYTES: 5 % (ref 4–13)
NEUTROPHILS: 60 % (ref 40–74)
PLATELET: 387 10*3/uL (ref 140–415)
RBC: 4.74 x10E6/uL (ref 3.77–5.28)
RDW: 15.7 % — ABNORMAL HIGH (ref 12.3–15.4)
WBC: 8.7 10*3/uL (ref 4.0–10.5)

## 2010-07-18 LAB — AST: AST (SGOT): 40 IU/L (ref 0–40)

## 2010-07-18 LAB — METABOLIC PANEL, BASIC
BUN/Creatinine ratio: 19 (ref 9–23)
BUN: 11 mg/dL (ref 6–24)
CO2: 21 mmol/L (ref 20–32)
Calcium: 9.9 mg/dL (ref 8.7–10.2)
Chloride: 105 mmol/L (ref 97–108)
Creatinine: 0.57 mg/dL (ref 0.57–1.00)
GFR est non-AA: 114 mL/min/{1.73_m2} (ref >59)
Glucose: 98 mg/dL (ref 65–99)
Potassium: 4.7 mmol/L (ref 3.5–5.2)
Sodium: 138 mmol/L (ref 134–144)
eGFR If African American: 131 mL/min/{1.73_m2} (ref >59)

## 2010-07-18 LAB — ALT: ALT (SGPT): 49 IU/L — ABNORMAL HIGH (ref 0–40)

## 2010-07-19 NOTE — Progress Notes (Signed)
Pt presents for a follow up. Pt has no complaints at this time.

## 2010-07-19 NOTE — Patient Instructions (Signed)
In one month, may cut Lexapro to 5 mg if doing well. Contact me.   Try pravastatin again, let me know how it goes.

## 2010-07-19 NOTE — Progress Notes (Signed)
HISTORY OF PRESENT ILLNESS  Katrina [name] is a 45 y.o. female here for a follow up visit.     HPI 1. Fatigue: She has been on only 10 mg of Lexapro, and feels very good. She has had more energy, has felt more focused. She would like to come off Lexapro completely. She does not have the day to day mood issues.     2. Hypertension:  She has been compliant with medication; she has been monitoring her blood pressure and it was good until the last week. It is the anniversary of her mother's death, and she is closing on her house now.  There have been no known side effects from lisinopril.  She has been getting blood pressures 130-140/90s this week. She denies chest pain, dyspnea, lower extremity swelling.      3. Hyperlipidemia: She is going to be going to a new nutritionist to work on eating issues from a psychologic perspective. She has been off her pravastatin for several months. Her pain completely resolved, but she also had PT for her back. This has resolved, and most of her pain was in her legs.  She is worried about her increased ALT, but has no abdominal discomfort.         Current Outpatient Prescriptions   Medication Sig   ??? lansoprazole (PREVACID) 30 mg capsule Take  by mouth Daily (before breakfast).     ??? cholecalciferol, vitamin D3, (VITAMIN D3) 2,000 unit Tab Take  by mouth.     ??? escitalopram (LEXAPRO) 10 mg tablet Take 1 Tab by mouth daily.   ??? metFORMIN (GLUCOPHAGE) 500 mg tablet Take 2 Tabs by mouth two (2) times daily (with meals).   ??? b complex vitamins tablet Take 1 Tab by mouth daily.     ??? levothyroxine (LEVOXYL) 88 mcg tablet Take 1 Tab by mouth daily.   ??? lisinopril (PRINIVIL, ZESTRIL) 10 mg tablet Take 1 Tab by mouth daily.   ??? ALPRAZolam (XANAX) 0.5 mg tablet Take 1 Tab by mouth nightly as needed for Sleep and Anxiety.   ??? Lancets (ONE TOUCH DELICA) Misc by Does Not Apply route. As directed.  Indications: One Touch Ultra 2 Lancets (Delica)    ??? glucose blood VI test strips (ASCENSIA AUTODISC VI, ONE TOUCH ULTRA TEST VI) strip by Does Not Apply route two (2) times a day. ONE TOUCH ULTRA 2  Dx. 250.00, fluctuating blood sugars.   ??? ascorbic acid (VITAMIN C) 500 mg tablet Take  by mouth.   ??? vitamin E (AQUA GEMS) 400 unit capsule Take 400 Units by mouth two (2) times a day.            Allergies   Allergen Reactions   ??? Aldactone (Spironolactone) Swelling   ??? Codeine Rash and Itching        ROS see HPI    BP 140/98   Pulse 60   Ht 5\' 5"  (1.651 m)   Wt 207 lb 3.2 oz (93.985 kg)   BMI 34.48 kg/m2   LMP 06/16/2010     Physical Exam   Vitals reviewed.  Constitutional: She appears well-developed and well-nourished. No distress.   Cardiovascular: Normal rate, regular rhythm, normal heart sounds and intact distal pulses.  Exam reveals no gallop and no friction rub.    No murmur heard.  Pulses:       Radial pulses are 2+ on the right side, and 2+ on the left side.  Dorsalis pedis pulses are 2+ on the right side, and 2+ on the left side.        Posterior tibial pulses are 2+ on the right side, and 2+ on the left side.        No peripheral edema noted   Pulmonary/Chest: Effort normal and breath sounds normal. She has no wheezes. She has no rales.   Psychiatric: She has a normal mood and affect. Her speech is normal and behavior is normal.         Results for Katrina, [name] (MRN [id number]) as of 07/19/2010 13:34   Ref. Range 07/17/2010 08:44   WBC Latest Range: 4.0-10.5 x10E3/uL 8.7   RBC Latest Range: 3.77-5.28 x10E6/uL 4.74   HGB Latest Range: 11.1-15.9 g/dL 04.5   HCT Latest Range: 34.0-46.6 % 40.0   MCV Latest Range: 79-97 fL 84   MCH Latest Range: 26.6-33.0 pg 27.0   MCHC Latest Range: 31.5-35.7 g/dL 40.9   RDW Latest Range: 12.3-15.4 % 15.7 (H)   PLATELET Latest Range: 140-415 x10E3/uL 387   NEUTROPHILS Latest Range: 40-74 % 60   MONOCYTES Latest Range: 4-13 % 5   EOSINOPHILS Latest Range: 0-7 % 4   BASOPHILS Latest Range: 0-3 % 0    IMMATURE GRANULOCYTES Latest Range: 0-2 % 0   ABS. NEUTROPHILS Latest Range: 1.8-7.8 x10E3/uL 5.1   ABS. IMM. GRANS. Latest Range: 0.0-0.1 x10E3/uL 0.0   ABS. MONOCYTES Latest Range: 0.1-1.0 x10E3/uL 0.5   ABS. EOSINOPHILS Latest Range: 0.0-0.4 x10E3/uL 0.4   ABS. BASOPHILS Latest Range: 0.0-0.2 x10E3/uL 0.0   Sodium Latest Range: 134-144 mmol/L 138   Potassium Latest Range: 3.5-5.2 mmol/L 4.7   Chloride Latest Range: 97-108 mmol/L 105   CO2 Latest Range: 20-32 mmol/L 21   Glucose Latest Range: 65-99 mg/dL 98   BUN Latest Range: 6-24 mg/dL 11   Creatinine Latest Range: 0.57-1.00 mg/dL 8.11   BUN/Creatinine ratio Latest Range: 9-23  19   Calcium Latest Range: 8.7-10.2 mg/dL 9.9   GFR est non-AA Latest Range: >59 mL/min/1.73 114   ALT Latest Range: 0-40 IU/L 49 (H)   AST Latest Range: 0-40 IU/L 40   Triglyceride Latest Range: 0-149 mg/dL 914 (H)   Cholesterol, total Latest Range: 100-199 mg/dL 782   HDL Cholesterol Latest Range: >39 mg/dL 39 (L)   LDL, calculated Latest Range: 0-99 mg/dL 956 (H)   LDL/HDL Ratio Latest Range: 0.0-3.2 ratio units 3.1   VLDL, calculated Latest Range: 5-40 mg/dL 37     ASSESSMENT and PLAN    Hyperlipidemia--Untreated. Above goal now. She will try her pravastatin again, now that her back is treated, I think she will find she has no pain. Discussed Livalo and Welchol as alternative options if still not tolerated. Follow up in two months.    Fatty liver--Mild increase in ALT now. She will resume her statin, work on diet for diabetes control. Follow up with labs in two months.    Essential hypertension, benign--Poor control today, but was better in the last couple months. She will continue to monitor at home daily, but follow up sooner if persistently elevated.      Depression--Stable. Energy much improved. Continue current dose for now. Discussed tapering down on Lexapro to 5 mg when her life settles down, she is less stressed. She should not stop at this time. Also reminded her of the risks of rebound symptoms.        Follow-up Disposition:  Return in about 2 months (around 09/19/2010) for  HTN, cholesterol, fatty liver, thyroid.

## 2010-07-28 ENCOUNTER — Encounter

## 2010-07-28 MED ORDER — PRAVASTATIN 40 MG TAB
40 mg | ORAL_TABLET | Freq: Every evening | ORAL | Status: DC
Start: 2010-07-28 — End: 2010-09-22

## 2010-09-04 NOTE — Progress Notes (Signed)
Dry cough since yesterday.  Body aches; chills.  No head or chest congestion.

## 2010-09-04 NOTE — Progress Notes (Signed)
Quick Note:    Pt notified of results as per Dr. Hayes. Pt verbalized understanding.  ______

## 2010-09-04 NOTE — Progress Notes (Signed)
HISTORY OF PRESENT ILLNESS  Katrina [name] is a 45 y.o. female.  URI   This is a new problem. Episode onset: 3 days. The problem has not changed since onset.Patient reports a subjective (Also reports fatigue, diffuse myalgias, and chills) fever - was not measured.Associated symptoms include ear pain (fullness, bilaterally), plugged ear sensation and cough (dry, intermittent). Pertinent negatives include no chest pain, no congestion, no rhinorrhea, no sinus pain, no sore throat and no wheezing (or SOB/DOE). Treatments tried: coridican. The treatment provided no relief.   She reports nephew was sick w/ similar sx last week.      Patient Active Problem List   Diagnoses Date Noted   ??? Chest pain, unspecified 12/12/2009   ??? Abnormal EKG 12/12/2009   ??? Adrenal adenoma 12/12/2009   ??? Hypotension (arterial) 12/12/2009   ??? Hypokalemia 07/13/2008   ??? Essential Hypertension, Benign 03/19/2008   ??? Diabetes Mellitus 12/19/2007   ??? S/P Tonsillectomy 07/25/2007   ??? S/P Appendectomy 07/25/2007   ??? Obesity 07/25/2007   ??? Hyperlipidemia 07/25/2007   ??? Hypothyroidism 07/25/2007   ??? GE Reflux 07/25/2007   ??? Depression 07/25/2007   ??? Fatty Liver 07/25/2007     Current Outpatient Prescriptions   Medication Sig Dispense Refill   ??? pravastatin (PRAVACHOL) 40 mg tablet Take 1 Tab by mouth nightly.  30 Tab  1   ??? lansoprazole (PREVACID) 30 mg capsule Take 30 mg by mouth Daily (before breakfast).       ??? cholecalciferol, vitamin D3, (VITAMIN D3) 2,000 unit Tab Take 2,000 Units by mouth.       ??? escitalopram (LEXAPRO) 10 mg tablet Take 1 Tab by mouth daily.  30 Tab  0   ??? metFORMIN (GLUCOPHAGE) 500 mg tablet Take 2 Tabs by mouth two (2) times daily (with meals).  360 Tab  3   ??? b complex vitamins tablet Take 1 Tab by mouth daily.         ??? levothyroxine (LEVOXYL) 88 mcg tablet Take 1 Tab by mouth daily.  90 Tab  3   ??? lisinopril (PRINIVIL, ZESTRIL) 10 mg tablet Take 1 Tab by mouth daily.  30 Tab  11    ??? Lancets (ONE TOUCH DELICA) Misc by Does Not Apply route. As directed.  Indications: One Touch Ultra 2 Lancets (Delica)  3 Package  3   ??? glucose blood VI test strips (ASCENSIA AUTODISC VI, ONE TOUCH ULTRA TEST VI) strip by Does Not Apply route two (2) times a day. ONE TOUCH ULTRA 2  Dx. 250.00, fluctuating blood sugars.  3 Package  3   ??? ascorbic acid (VITAMIN C) 500 mg tablet Take  by mouth.       ??? vitamin E (AQUA GEMS) 400 unit capsule Take 400 Units by mouth two (2) times a day.       ??? ALPRAZolam (XANAX) 0.5 mg tablet Take 1 Tab by mouth nightly as needed for Sleep and Anxiety.  90 Tab  1           Review of Systems   HENT: Positive for ear pain (fullness, bilaterally). Negative for congestion, sore throat and rhinorrhea.    Respiratory: Positive for cough (dry, intermittent). Negative for wheezing (or SOB/DOE).    Cardiovascular: Negative for chest pain.       Physical Exam   Constitutional: No distress.        Feels warm to the touch, slightly sweaty   HENT:   Right Ear: Tympanic  membrane is not erythematous and not bulging. No middle ear effusion.   Left Ear: Tympanic membrane is not erythematous and not bulging.  No middle ear effusion.   Nose: No mucosal edema or rhinorrhea. Right sinus exhibits no maxillary sinus tenderness and no frontal sinus tenderness. Left sinus exhibits no maxillary sinus tenderness and no frontal sinus tenderness.   Mouth/Throat: Uvula is midline and mucous membranes are normal. No oropharyngeal exudate or posterior oropharyngeal erythema.   Eyes: Conjunctivae are normal. No scleral icterus.   Neck: Neck supple.   Cardiovascular: Regular rhythm and normal heart sounds.    No murmur heard.  Pulmonary/Chest: Effort normal and breath sounds normal. She has no wheezes. She has no rales.   Lymphadenopathy:     She has no cervical adenopathy.      BP 160/92   Pulse 88   Temp(Src) 98.5 ??F (36.9 ??C) (Oral)   Ht 5\' 5"  (1.651 m)   Wt 209 lb 9.6 oz (95.074 kg)   BMI 34.88 kg/m2   LMP 08/11/2010  REPEAT: 144/86      ASSESSMENT and PLAN  Katrina [name] was seen today for uri.  Diagnoses and associated orders for this visit:    Uri (upper respiratory infection)- sounds viral, no localizing sx, will check CXR to r/o atypical PNA.  If neg then sx treatment  - XR CHEST PA LAT; Future  - dextromethorphan-guaiFENesin (MUCINEX DM) 30-600 mg per tablet; Take 1 Tab by mouth two (2) times a day.    Cough  - XR CHEST PA LAT; Future  - dextromethorphan-guaiFENesin (MUCINEX DM) 30-600 mg per tablet; Take 1 Tab by mouth two (2) times a day.        Follow-up Disposition:  Return if symptoms worsen or fail to improve.   Advised her to call back or return to office if symptoms worsen/change/persist.  Discussed expected course/resolution/complications of diagnosis in detail with patient.    Medication risks/benefits/costs/interactions/alternatives discussed with patient.  She was given an after visit summary which includes diagnoses, current medications, & vitals.  She expressed understanding with the diagnosis and plan.

## 2010-09-04 NOTE — Progress Notes (Signed)
Quick Note:    Please call patient. CXR normal, no signs of PNA.  ______

## 2010-09-22 MED ORDER — PRAVASTATIN 40 MG TAB
40 mg | ORAL_TABLET | Freq: Every evening | ORAL | Status: DC
Start: 2010-09-22 — End: 2010-10-24

## 2010-09-22 NOTE — Progress Notes (Signed)
HISTORY OF PRESENT ILLNESS  Katrina [name] is a 45 y.o. female .tdfun     HPI 1. Hyperlipidemia: Katrina [name] has been compliant with medication--she re-started pravastatin in July.  There have been no known side effects.  She denies unusual muscle aches, weakness, but has had some pain in her ribs and chest, more on the right for about a week or two. She did have a bad cough recently, that has almost resolved.     2. Hypertension:  She has been compliant with her medication, is checking her blood pressure sporadically. It has been around 120-130s/80-90.  There have been no known side effects.  She denies exertional chest pain, dyspnea, lower extremity swelling. She admits she has not been exercising.     3. Depression: She is currently down to 5 mg daily on the Lexapro. She has been doing well with this. At this point, she does want to try to stop the medication. Things are calm in her life.       Current Outpatient Prescriptions   Medication Sig   ??? pravastatin (PRAVACHOL) 40 mg tablet Take 1 Tab by mouth nightly.   ??? lansoprazole (PREVACID) 30 mg capsule Take 30 mg by mouth Daily (before breakfast).   ??? cholecalciferol, vitamin D3, (VITAMIN D3) 2,000 unit Tab Take 2,000 Units by mouth.   ??? escitalopram (LEXAPRO) 10 mg tablet Take 1 Tab by mouth daily.   ??? metFORMIN (GLUCOPHAGE) 500 mg tablet Take 2 Tabs by mouth two (2) times daily (with meals).   ??? b complex vitamins tablet Take 1 Tab by mouth daily.     ??? levothyroxine (LEVOXYL) 88 mcg tablet Take 1 Tab by mouth daily.   ??? lisinopril (PRINIVIL, ZESTRIL) 10 mg tablet Take 1 Tab by mouth daily.   ??? ALPRAZolam (XANAX) 0.5 mg tablet Take 1 Tab by mouth nightly as needed for Sleep and Anxiety.   ??? Lancets (ONE TOUCH DELICA) Misc by Does Not Apply route. As directed.  Indications: One Touch Ultra 2 Lancets (Delica)    ??? glucose blood VI test strips (ASCENSIA AUTODISC VI, ONE TOUCH ULTRA TEST VI) strip by Does Not Apply route two (2) times a day. ONE TOUCH ULTRA 2  Dx. 250.00, fluctuating blood sugars.   ??? ascorbic acid (VITAMIN C) 500 mg tablet Take  by mouth.   ??? vitamin E (AQUA GEMS) 400 unit capsule Take 400 Units by mouth two (2) times a day.        Allergies   Allergen Reactions   ??? Aldactone (Spironolactone) Swelling   ??? Codeine Rash and Itching        ROS see HPI    BP 132/80   Pulse 68   Ht 5\' 5"  (1.651 m)   Wt 207 lb 9.6 oz (94.167 kg)   BMI 34.55 kg/m2   LMP 08/11/2010     Physical Exam   Vitals reviewed.  Constitutional: She appears well-developed and well-nourished. No distress.   Cardiovascular: Normal rate, regular rhythm, normal heart sounds and intact distal pulses.  Exam reveals no gallop and no friction rub.    No murmur heard.  Pulses:       Radial pulses are 2+ on the right side, and 2+ on the left side.        Dorsalis pedis pulses are 2+ on the right side, and 2+ on the left side.        Posterior tibial pulses are 2+ on the right side, and 2+  on the left side.        No peripheral edema noted   Pulmonary/Chest: Effort normal and breath sounds normal. She has no wheezes. She has no rales. She exhibits bony tenderness (aterior sternum, lower lateral ribs R > L). She exhibits no crepitus.   Psychiatric: She has a normal mood and affect. Her speech is normal and behavior is normal.       ASSESSMENT and PLAN    Hyperlipidemia--Unknown control.  She is tolerating her pravastatin. Will repeat labs at her appointment with hepatology.   - LIPID PANEL  - pravastatin (PRAVACHOL) 40 mg tablet; Take 1 Tab by mouth nightly.  - pravastatin (PRAVACHOL) 40 mg tablet; Take 1 Tab by mouth nightly.    Depression--Resolved. On low dose Lexapro. She will stop now. We discussed possible rebound symptoms. She will follow up in three months.      Essential hypertension, benign--Fair control. She will resume exercise. Follow up in three months.     Chest wall pain--From cough, s/p URI. She will try Tylenol for this week, follow up if not resolved.         Follow-up Disposition:  Return in about 3 months (around 12/22/2010) for diabetes, blood pressure.

## 2010-09-23 ENCOUNTER — Encounter

## 2010-09-27 ENCOUNTER — Encounter

## 2010-10-09 ENCOUNTER — Encounter

## 2010-10-09 MED ORDER — BLOOD SUGAR DIAGNOSTIC TEST STRIPS
PACK | Status: DC
Start: 2010-10-09 — End: 2012-11-03

## 2010-10-09 NOTE — Telephone Encounter (Signed)
Sent to mailorder per [facility]OR[facility] Dr Dennis

## 2010-10-24 MED ORDER — ROSUVASTATIN 20 MG TAB
20 mg | ORAL_TABLET | Freq: Every evening | ORAL | Status: DC
Start: 2010-10-24 — End: 2010-11-10

## 2010-10-24 NOTE — Patient Instructions (Signed)
Well Visit???Ages 43 to 50: After Your Visit  Your Care Instructions  Physical exams can help you stay healthy. Your doctor has checked your overall health and may have suggested ways to take good care of yourself. He or she also may have recommended tests. At home, you can help prevent illness with healthy eating, regular exercise, and other steps.  Follow-up care is a key part of your treatment and safety. Be sure to make and go to all appointments, and call your doctor if you are having problems. It's also a good idea to know your test results and keep a list of the medicines you take.  How can you care for yourself at home?  ?? Reach and stay at a healthy weight. This will lower your risk for many problems, such as obesity, diabetes, heart disease, and high blood pressure.   ?? Get at least 30 minutes of physical activity on most days of the week. Walking is a good choice. You also may want to do other activities, such as running, swimming, cycling, or playing tennis or team sports. Discuss any changes in your exercise program with your doctor.   ?? Do not smoke or allow others to smoke around you. If you need help quitting, talk to your doctor about stop-smoking programs and medicines. These can increase your chances of quitting for good.   ?? Talk to your doctor about whether you have any risk factors for sexually transmitted infections (STIs). Having one sex partner (who does not have STIs and does not have sex with anyone else) is a good way to avoid these infections.   ?? Use birth control if you do not want to have children at this time. Talk with your doctor about the choices available and what might be best for you.   ?? Always wear sunscreen on exposed skin. Make sure the sunscreen blocks ultraviolet rays (both UVA and UVB) and has a sun protection factor (SPF) of at least 15. Use it every day, even when it is cloudy. Some doctors may recommend a higher SPF, such as 30.    ?? See a dentist one or two times a year for checkups and to have your teeth cleaned.   ?? Wear a seat belt in the car.   ?? Drink alcohol in moderation, if at all. That means no more than 2 drinks a day for men and 1 drink a day for women.   Follow your doctor's advice about when to have certain tests. These tests can spot problems early.  For everyone  ?? Cholesterol. Have the fat (cholesterol) in your blood tested after age 43. Your doctor will tell you how often to have this done based on your age, family history, or other things that can increase your risk for heart disease.   ?? Blood pressure. Experts suggest that healthy adults with normal blood pressure (119/79 mm Hg or below) have their blood pressure checked at least every 1 to 2 years. This can be done during a routine doctor visit. If you have slightly higher or high blood pressure, your doctor will suggest more frequent tests.   ?? Vision. Talk with your doctor about how often to have a glaucoma test.   ?? Diabetes. Ask your doctor whether you should have tests for diabetes.   ?? Colon cancer. Have a test for colon cancer at age 43. You may have one of several tests. If you are younger than 50, you may need a test earlier   if you have any risk factors. Risk factors include whether you already had a precancerous polyp removed from your colon or whether your parent, brother, sister, or child has had colon cancer.   For women  ?? Breast exam and mammogram. Talk to your doctor about when you should have a clinical breast exam and a mammogram. Medical experts differ on whether and how often women under 50 should have these tests. Your doctor can help you decide what is right for you.   ?? Pap test and pelvic exam. Begin Pap tests at age 43. A Pap test is the best way to find cervical cancer. The test often is part of a pelvic exam. Ask how often to have this test.    ?? Tests for sexually transmitted infections (STIs). Ask whether you should have tests for STIs. You may be at risk if you have sex with more than one person, especially if your partners do not wear condoms.   For men  ?? Tests for sexually transmitted infections (STIs). Ask whether you should have tests for STIs. You may be at risk if you have sex with more than one person, especially if you do not wear a condom.   ?? Testicular cancer exam. Ask your doctor whether you should check your testicles regularly.   ?? Prostate exam. Talk to your doctor about whether you should have a blood test (called a PSA test) for prostate cancer. Experts differ on whether and when men should have this test. Some experts suggest it if you are older than 45 and are African-American or have a father or brother who got prostate cancer when he was younger than 65.   When should you call for help?  Watch closely for changes in your health, and be sure to contact your doctor if you have any problems or symptoms that concern you.    Where can you learn more?    Go to http://www.healthwise.net/[facility]   Enter P072 in the search box to learn more about "Well Visit???Ages 43 to 50: After Your Visit."    ?? 2006-2012 [employer]. Care instructions adapted under license by [facility] (which disclaims liability or warranty for this information). This care instruction is for use with your licensed healthcare professional. If you have questions about a medical condition or this instruction, always ask your healthcare professional. Healthwise, Incorporated disclaims any warranty or liability for your use of this information.  Content Version: 9.4.94723; Last Revised: May 12, 2010

## 2010-10-24 NOTE — Progress Notes (Signed)
HISTORY OF PRESENT ILLNESS  Katrina [name] is a 45 y.o. female here for a complete physical.      HPI1.  Health Maintenance:  Katrina [name] is off her Lexapro and her mood has been good. She does not feel as foggy as she did with the medication. She is just starting to exercise again; had not been active. She has still be pretty tired, feels like she has not rested in the morning. Her last tetanus was in 2002.  Her liver tests and fasting lipids were done at her hepatology appointment in September. Her mammogram and pap smear were done in July with gynecology.       Patient Active Problem List   Diagnoses Code   ??? Obesity 278.00   ??? Hyperlipidemia 272.4   ??? Hypothyroidism 244.9   ??? GE Reflux 530.81   ??? Depression 311   ??? Fatty Liver 571.8   ??? Diabetes Mellitus 250.00   ??? Essential Hypertension, Benign 401.1   ??? Hypokalemia 276.8   ??? Abnormal EKG 794.31         Past Medical History   Diagnosis Date   ??? S/P tonsillectomy 07/25/2007   ??? S/P appendectomy 07/25/2007   ??? Obesity 07/25/2007   ??? Hyperlipidemia 07/25/2007   ??? Fatty liver 07/25/2007   ??? Elevated triglycerides with high cholesterol 07/25/2007   ??? Depression 07/25/2007   ??? GE reflux 07/25/2007   ??? HTN 07/25/2007   ??? Hypothyroidism 07/25/2007   ??? Pap smear for cervical cancer screening 08/02/10   ??? Hx of mammogram 7/12     MRI, 6 mo   ??? Adrenal adenoma 12/12/2009   ??? Chest pain, unspecified 12/12/2009         Past Surgical History   Procedure Date   ??? Pr appendectomy    ??? Hx tonsillectomy    ??? Hx breast reduction    ??? Hx cholecystectomy    ??? Pr abdomen surgery proc unlisted 08/01/09     laprascopic right adrenalectomy           Current Outpatient Prescriptions   Medication Sig   ??? glucose blood VI test strips (ASCENSIA AUTODISC VI, ONE TOUCH ULTRA TEST VI) strip Two times daily   ??? pravastatin (PRAVACHOL) 40 mg tablet Take 1 Tab by mouth nightly.   ??? pravastatin (PRAVACHOL) 40 mg tablet Take 1 Tab by mouth nightly.    ??? lansoprazole (PREVACID) 30 mg capsule Take 30 mg by mouth Daily (before breakfast).   ??? cholecalciferol, vitamin D3, (VITAMIN D3) 2,000 unit Tab Take 2,000 Units by mouth.   ??? metFORMIN (GLUCOPHAGE) 500 mg tablet Take 2 Tabs by mouth two (2) times daily (with meals).   ??? b complex vitamins tablet Take 1 Tab by mouth daily.     ??? levothyroxine (LEVOXYL) 88 mcg tablet Take 1 Tab by mouth daily.   ??? lisinopril (PRINIVIL, ZESTRIL) 10 mg tablet Take 1 Tab by mouth daily.   ??? Lancets (ONE TOUCH DELICA) Misc by Does Not Apply route. As directed.  Indications: One Touch Ultra 2 Lancets (Delica)   ??? ascorbic acid (VITAMIN C) 500 mg tablet Take  by mouth.   ??? vitamin E (AQUA GEMS) 400 unit capsule Take 400 Units by mouth two (2) times a day.        Allergies   Allergen Reactions   ??? Aldactone (Spironolactone) Swelling   ??? Codeine Rash and Itching          Family History  Problem Relation Age of Onset   ??? Hypertension Mother    ??? Breast Cancer Mother    ??? Stroke Mother    ??? Diabetes Father    ??? Hypertension Father    ??? Stroke Sister            History     Social History   ??? Marital Status: Single     Spouse Name: N/A     Number of Children: N/A   ??? Years of Education: N/A     Occupational History   ??? [employer]     Social History Main Topics   ??? Smoking status: Former Smoker     Quit date: 02/08/2009   ??? Smokeless tobacco: Never Used   ??? Alcohol Use: 0.5 oz/week     1 Glasses of wine per week   ??? Drug Use: No   ??? Sexually Active: Yes     Other Topics Concern   ??? Not on file     Social History Narrative   ??? No narrative on file       Review of Systems   Constitutional: Negative.    HENT: Negative.    Eyes: Negative.    Respiratory: Negative.    Cardiovascular: Negative.    Gastrointestinal: Negative.    Genitourinary: Negative.    Musculoskeletal: Negative.    Skin: Negative.    Neurological: Negative.    Endo/Heme/Allergies: Negative.    Psychiatric/Behavioral: Negative.        Filed Vitals:     10/24/10 1553 10/24/10 1627   BP: 150/78 134/80   Pulse: 84    Height: 5\' 5"  (1.651 m)    Weight: 211 lb 6.4 oz (95.89 kg)       Body mass index is 35.18 kg/(m^2).       Physical Exam   Vitals reviewed.  Constitutional: She is oriented to person, place, and time. She appears well-developed and well-nourished. No distress.   HENT:   Right Ear: External ear and ear canal normal. Tympanic membrane is not erythematous. No middle ear effusion.   Left Ear: External ear and ear canal normal. Tympanic membrane is not erythematous.  No middle ear effusion.   Nose: Nose normal.   Mouth/Throat: Oropharynx is clear and moist. No oropharyngeal exudate.   Eyes: Conjunctivae are normal. Right conjunctiva is not injected. Left conjunctiva is not injected. No scleral icterus.   Fundoscopic exam:       The right eye shows no papilledema.        The left eye shows no papilledema.   Neck: Neck supple. No JVD present. No thyromegaly present.   Cardiovascular: Normal rate, regular rhythm, S1 normal and S2 normal.  Exam reveals no gallop and no friction rub.    No murmur heard.  Pulses:       Radial pulses are 2+ on the right side, and 2+ on the left side.        Dorsalis pedis pulses are 2+ on the right side, and 2+ on the left side.        Posterior tibial pulses are 2+ on the right side, and 2+ on the left side.   Pulmonary/Chest: Effort normal and breath sounds normal. No respiratory distress. She has no wheezes. She has no rales. Right breast exhibits no inverted nipple, no mass, no nipple discharge, no skin change and no tenderness. Left breast exhibits no inverted nipple, no mass, no nipple discharge, no skin change and no tenderness.  Abdominal: Soft. Bowel sounds are normal. She exhibits no distension and no mass. There is no hepatosplenomegaly. There is no tenderness. There is no rebound and no guarding.   Musculoskeletal: Normal range of motion. She exhibits no edema.   Lymphadenopathy:         Head (right side): No submental and no submandibular adenopathy present.        Head (left side): No submental and no submandibular adenopathy present.     She has no cervical adenopathy.     She has no axillary adenopathy.        Right: No supraclavicular adenopathy present.        Left: No supraclavicular adenopathy present.   Neurological: She is alert and oriented to person, place, and time. She has normal reflexes. No cranial nerve deficit or sensory deficit.   Skin: Skin is warm and dry. No rash noted.   Psychiatric: She has a normal mood and affect. Her speech is normal and behavior is normal.       TGs 162, LDL 104, non-HDL Cholesterol 122  A1c 6.0%      ASSESSMENT and PLAN    Well woman exam (no gynecological exam)--Recommended diet and regular exercise with a goal of weight loss. Labs reviewed today. Tetanus and influenza vaccinations updated today. She will have her pneumonia vaccine again in December. Pap smear and mammogram are up to date; she will continue this with her gynecologist.     Need for diphtheria-tetanus-pertussis (tdap) vaccine, adult/adolescent  - PR IMMUNIZ ADMIN,1 SINGLE/COMB VAC/TOXOID  - TETANUS, DIPHTHERIA TOXOIDS AND ACELLULAR PERTUSSIS VACCINE (TDAP), IN INDIVIDS. >=7, IM    Need for influenza vaccination  - PR IMMUNIZ ADMIN,1 SINGLE/COMB VAC/TOXOID  - INFLUENZA VIRUS VACCINE, SPLIT, PRES. FREE, IN INDIVIDS. >=3 YRS OF AGE, IM    Fatigue--Persistent with snoring. She will have her sleep study again now that she is off the Lexapro for complete evaluation.   - REFERRAL TO SLEEP STUDIES    Snoring  - REFERRAL TO SLEEP STUDIES    Diabetes mellitus--A1C at goal. Will follow up in December, as scheduled, but microalbuminuria due now. Continue current medications.  - MICROALBUMIN, UR, RAND     Hyperlipidemia--Previously better controlled. She stopped the Crestor due to possible side effects, but did not have true statin effects, as she is now tolerating the pravastatin. Will try samples of Crestor, assess for tolerability, then decide on continuing or adding treatment to pravastatin. Labs at follow up.  - rosuvastatin (CRESTOR) 20 mg tablet; Take 1 Tab by mouth nightly.        Follow-up Disposition:  Return in about 6 months (around 04/24/2011) for diabetes.

## 2010-10-25 NOTE — Telephone Encounter (Signed)
Left message for pt to schedule consult appt per Dr. Shirlean Schlein; snoring, fatigue

## 2010-10-25 NOTE — Telephone Encounter (Signed)
Spoke with pt; appt scheduled for 10/31/10

## 2010-10-31 ENCOUNTER — Encounter

## 2010-10-31 NOTE — Progress Notes (Signed)
[name] is a 45 y.o. Caucasian female who had undergone an overnight polysomnography in March 2011, that did not indicate the presence of obstructive sleep apnea.  Apnea-hypopnea index was only 3.1 per hour of sleep.      Patient returns with worsening complaints of snoring, snorting, poor quality of nocturnal sleep with patient waking up feeling tired and exhausted in the morning.  Usual bedtime is between 9:30 p.m. and 11:00 p.m., and usual wake time is 6:30, but she states that she tends to hit the snooze button until 7:00 a.m. or 7:15 a.m.  She states that she is tired and sluggish until midday, and then the rest of the day goes well, until towards the end of the day she starts becoming tired again and is unable to exercise or perform any tasks that require physical strength.  Epworth score today was 6.       Physical Examination:  Today, indicated the presence of a well-developed pleasant lady.       BP 145/86   Pulse 76   Ht 5\' 5"  (1.651 m)   Wt 212 lb (96.163 kg)   BMI 35.28 kg/m2   SpO2 96%   LMP 10/12/2010    Evaluation of the oropharynx revealed a mild arched palate, Mallampati class IV.  NECK:  Supple without adenopathy or mass.    CHEST:  Good air entry bilaterally.    CVS:  Good rate, rhythm.  No murmurs auscultated.    ABDOMEN:  Soft, nontender.    EXTREMITIES:  No clubbing, cyanosis, or edema.    CNS:  Awake, alert, oriented, ambulating.    PSYCHOLOGIC:  Pleasant with good mood, patient did break down in the middle of the visit and began to cry, expressing frustration over daytime tiredness.      Impression:    1.  Sleep apnea - unspecified.    2.  Hypertension.    3.  Diabetes mellitus.     4.  Obesity.      Recommendations:    1.  Differential diagnosis of daytime tiredness, reviewed.  Obstructive sleep apnea may still be one of the potential reasons why she is feeling fatigued and tired, although other reasons may need to be considered.     2.  Recommend overnight polysomnography, which was scheduled at the end of this visit.    3.  Advised return follow up shortly after testing to review results and to plan further management.      Thank you for allowing me to participate in your patient's care.      MedDATA/pag

## 2010-10-31 NOTE — Patient Instructions (Signed)
[address]  [place] [address]  Tel.  [phone]  Fax. [phone] [address]  [place] [address]  Tel.  [phone]  Fax. [phone] [address] [place] [address]  Tel.  [phone]  Fax. [phone]     Sleep Apnea: After Your Visit  Your Care Instructions  Sleep apnea occurs when you frequently stop breathing for 10 seconds or longer during sleep. It can be mild to severe, based on the number of times per hour that you stop breathing or have slowed breathing. Blocked or narrowed airways in your nose, mouth, or throat can cause sleep apnea. Your airway can become blocked when your throat muscles and tongue relax during sleep.  Sleep apnea is common, occurring in 1 out of 20 individuals.  Individuals having any of the following characteristics should be evaluated and treated right away due to high risk and detrimental consequences from untreated sleep apnea:  1. Obesity  2. Congestive Heart failure  3. Atrial Fibrillation  4. Uncontrolled Hypertension  5. Type II Diabetes  6. Night-time Arrhythmias  7. Stroke  8. Pulmonary Hypertension  9. High-risk Driving Populations (pilots, truck drivers, etc.)  10. Patients Considering Weight-loss Surgery    How do you know you have sleep apnea?  You probably have sleep apnea if you answer 'yes' to 3 or more of the following questions:  S - Have you been told that you Snore?   T - Are you often Tired during the day?  O - Has anyone Observed you stop breathing while sleeping?  P- Do you have (or are being treated for) high blood Pressure?    B - Are you obese (Body Mass Index > 35)?  A - Is your Age 50 years old or older?  N - Is your Neck size greater than 16 inches?  G - Are you female Gender?   A sleep physician can prescribe a breathing device that prevents tissues in the throat from blocking your airway. Or your doctor may recommend using a dental device (oral breathing device) to help keep your airway open. In some cases, surgery may be needed to remove enlarged tissues in the throat.  Follow-up care is a key part of your treatment and safety. Be sure to make and go to all appointments, and call your doctor if you are having problems. It's also a good idea to know your test results and keep a list of the medicines you take.  How can you care for yourself at home?  ?? Lose weight, if needed. It may reduce the number of times you stop breathing or have slowed breathing.  ?? Go to bed at the same time every night.  ?? Sleep on your side. It may stop mild apnea. If you tend to roll onto your back, sew a pocket in the back of your pajama top. Put a tennis ball into the pocket, and stitch the pocket shut. This will help keep you from sleeping on your back.  ?? Avoid alcohol and medicines such as sleeping pills and sedatives before bed.  ?? Do not smoke. Smoking can make sleep apnea worse. If you need help quitting, talk to your doctor about stop-smoking programs and medicines. These can increase your chances of quitting for good.  ?? Prop up the head of your bed 4 to 6 inches by putting bricks under the legs of the bed.  ?? Treat breathing problems, such as a stuffy nose, caused   by a cold or allergies.  ?? Use a continuous positive airway pressure (CPAP) breathing machine if lifestyle changes do not help your apnea and your doctor recommends it. The machine keeps your airway from closing when you sleep.  ?? If CPAP does not help you, ask your doctor whether you should try other breathing machines. A bilevel positive airway pressure machine has two types of air pressure????????one for breathing in and one for breathing out. Another device raises or lowers air pressure as needed while you breathe.   ?? If your nose feels dry or bleeds when using one of these machines, talk with your doctor about increasing moisture in the air. A humidifier may help.  ?? If your nose is runny or stuffy from using a breathing machine, talk with your doctor about using decongestants or a corticosteroid nasal spray.  When should you call for help?  Watch closely for changes in your health, and be sure to contact your doctor if:  ?? You still have sleep apnea even though you have made lifestyle changes.  ?? You are thinking of trying a device such as CPAP.  ?? You are having problems using a CPAP or similar machine.                Where can you learn more?   Go to http://www.healthwise.net/[facility].  Enter J936 in the search box to learn more about "Sleep Apnea: After Your Visit."   ?? 2006-2010 Healthwise, Incorporated. Care instructions adapted under license by [facility] (which disclaims liability or warranty for this information). This care instruction is for use with your licensed healthcare professional. If you have questions about a medical condition or this instruction, always ask your healthcare professional. Healthwise disclaims any warranty or liability for your use of this information.      PROPER SLEEP HYGIENE    What to avoid  ?? Do not have drinks with caffeine, such as coffee or black tea, for 8 hours before bed.  ?? Do not smoke or use other types of tobacco near bedtime. Nicotine is a stimulant and can keep you awake.  ?? Avoid drinking alcohol late in the evening, because it can cause you to wake in the middle of the night.  ?? Do not eat a big meal close to bedtime. If you are hungry, eat a light snack.  ?? Do not drink a lot of water close to bedtime, because the need to urinate may wake you up during the night.  ?? Do not read or watch TV in bed. Use the bed only for sleeping and sexual activity.  What to try   ?? Go to bed at the same time every night, and wake up at the same time every morning. Do not take naps during the day.  ?? Keep your bedroom quiet, dark, and cool.  ?? Get regular exercise, but not within 3 to 4 hours of your bedtime..  ?? Sleep on a comfortable pillow and mattress.  ?? If watching the clock makes you anxious, turn it facing away from you so you cannot see the time.  ?? If you worry when you lie down, start a worry book. Well before bedtime, write down your worries, and then set the book and your concerns aside.  ?? Try meditation or other relaxation techniques before you go to bed.  ?? If you cannot fall asleep, get up and go to another room until you feel sleepy. Do something relaxing. Repeat your bedtime routine   before you go to bed again.  ?? Make your house quiet and calm about an hour before bedtime. Turn down the lights, turn off the TV, log off the computer, and turn down the volume on music. This can help you relax after a busy day.    Drowsy Driving  The [place] National Highway Traffic Safety Administration cites drowsiness as a causing factor in more than 100,000 police reported crashes annually, resulting in 76,000 injuries and 1,500 deaths. Other surveys suggest 55% of people polled have driven while drowsy in the past year, 23% had fallen asleep but not crashed, 3% crashed, and 2% had and accident due to drowsy driving.  Who is at risk?   Young Drivers: One study of drowsy driving accidents states that 55% of the drivers were under 25 years. Of those, 75% were female.   Shift Workers and Travelers: People who work overnight or travel across time zones frequently are at higher risk of experiencing Circadian Rhythm Disorders. They are trying to work and function when their body is programed to sleep.    Sleep Deprived: Lack of sleep has a serious impact on your ability to pay attention or focus on a task. Consistently getting less than the average of 8 hours your body needs creates partial or cumulative sleep deprivation.   Untreated Sleep Disorders: Sleep Apnea, Narcolepsy, R.L.S., and other sleep disorders (untreated) prevent a person from getting enough restful sleep. This leads to excessive daytime sleepiness and increases the risk for drowsy driving accidents by up to 7 times.  Medications / Alcohol: Even over the counter medications can cause drowsiness. Medications that impair a drivers attention should have a warning label. Alcohol naturally makes you sleepy and on its own can cause accidents. Combined with excessive drowsiness its effects are amplified.   Signs of Drowsy Driving:   * You don't remember driving the last few miles   * You may drift out of your lane   * You are unable to focus and your thoughts wander   * You may yawn more often than normal   * You have difficulty keeping your eyes open / nodding off   * Missing traffic signs, speeding, or tailgating  Prevention-   Good sleep hygiene, lifestyle and behavioral choices have the most impact on drowsy driving. There is no substitute for sleep and the average person requires 8 hours nightly. If you find yourself driving drowsy, stop and sleep. Consider the sleep hygiene tips provided during your visit as well.     Medication Refill Policy: Refills for all medications require 1 week advance notice. Please have your pharmacy fax a refill request. We are unable to fax, or call in "controled substance" medications and you will need to pick these prescriptions up from our office.     MyChart Activation     Thank you for requesting access to MyChart. Please follow the instructions below to securely access and download your online medical record. MyChart allows you to send messages to your doctor, view your test results, renew your prescriptions, schedule appointments, and more.    How Do I Sign Up?    1. In your internet browser, go to [email or web address].  2. Click on the First Time User? Click Here link in the Sign In box. You will see the New Member Sign Up page.  3. Enter your MyChart Access Code exactly as it appears below. You will not need to use this code after you???ve completed the sign-up process. If   you do not sign up before the expiration date, you must request a new code.    MyChart Access Code: Not generated  Current MyChart Status: Active (This is the date your MyChart access code will expire)    4. Enter the last four digits of your Social Security Number (xxxx) and Date of Birth (mm/dd/yyyy) as indicated and click Submit. You will be taken to the next sign-up page.  5. Create a MyChart ID. This will be your MyChart login ID and cannot be changed, so think of one that is secure and easy to remember.  6. Create a MyChart password. You can change your password at any time.  7. Enter your Password Reset Question and Answer. This can be used at a later time if you forget your password.   8. Enter your e-mail address. You will receive e-mail notification when new information is available in MyChart.  9. Click Sign Up. You can now view and download portions of your medical record.  10. Click the Download Summary menu link to download a portable copy of your medical information.    Additional Information    If you have questions, please call [phone]. Remember, MyChart is NOT to be used for urgent needs. For medical emergencies, dial 911.

## 2010-11-10 ENCOUNTER — Encounter

## 2010-11-10 MED ORDER — ROSUVASTATIN 20 MG TAB
20 mg | ORAL_TABLET | Freq: Every evening | ORAL | Status: DC
Start: 2010-11-10 — End: 2011-08-26

## 2010-12-05 ENCOUNTER — Encounter

## 2010-12-05 NOTE — Progress Notes (Signed)
Polysomnogram was performed and the results of the study were explained to the patient.  Please refer to interpretation report for further details.  Apnea/Hypopnea index of 5.2 which indicates mild apnea.  She continues to have snoring.    *Katrina [name] was provided information on sleep apnea including coresponding risk factors and the importance of proper treatment.  * Treatment options were offered.  Patient has elected to proceed with a positive airway pressure trial (CPAP).  * A second polysomnogram was ordered for mask fitting, PAP desensitizing protocol, and pressure titration.  * Follow-up appointment was scheduled 6-8 weeks following PAP initiation to gauge treatment response and compliance.  * Counseling was provided regarding the importance of regular PAP therapy.    * We have recommended a dedicated weight loss and exercise regiment as significant weight reduction has been shown to reduce severity of obstructive sleep apnea.  * Counseling was provided regarding safe driving and proper sleep hygiene, with particular attention to maintaining lateral positioning while sleeping with the use of bed pillows to reduce apnic events at night.  * Patient was asked to contact our office at any time for further questions regarding their sleep symptoms.    Office visit exceeded 25 minutes with counseling and direction of care taking up more than 50% of the allotted time.    Thank you for allowing to participate in your patient's medical care.

## 2010-12-05 NOTE — Patient Instructions (Signed)
[address]  [place] [address]  Tel.  [phone]  Fax. [phone] [address]  [place] [address]  Tel.  [phone]  Fax. [phone] [address] [place] [address]  Tel.  [phone]  Fax. [phone]     Sleep Apnea: After Your Visit  Your Care Instructions  Sleep apnea occurs when you frequently stop breathing for 10 seconds or longer during sleep. It can be mild to severe, based on the number of times per hour that you stop breathing or have slowed breathing. Blocked or narrowed airways in your nose, mouth, or throat can cause sleep apnea. Your airway can become blocked when your throat muscles and tongue relax during sleep.  Sleep apnea is common, occurring in 1 out of 20 individuals.  Individuals having any of the following characteristics should be evaluated and treated right away due to high risk and detrimental consequences from untreated sleep apnea:  1. Obesity  2. Congestive Heart failure  3. Atrial Fibrillation  4. Uncontrolled Hypertension  5. Type II Diabetes  6. Night-time Arrhythmias  7. Stroke  8. Pulmonary Hypertension  9. High-risk Driving Populations (pilots, truck drivers, etc.)  10. Patients Considering Weight-loss Surgery    How do you know you have sleep apnea?  You probably have sleep apnea if you answer 'yes' to 3 or more of the following questions:  S - Have you been told that you Snore?   T - Are you often Tired during the day?  O - Has anyone Observed you stop breathing while sleeping?  P- Do you have (or are being treated for) high blood Pressure?    B - Are you obese (Body Mass Index > 35)?  A - Is your Age 43 years old or older?  N - Is your Neck size greater than 16 inches?  G - Are you female Gender?   A sleep physician can prescribe a breathing device that prevents tissues in the throat from blocking your airway. Or your doctor may recommend using a dental device (oral breathing device) to help keep your airway open. In some cases, surgery may be needed to remove enlarged tissues in the throat.  Follow-up care is a key part of your treatment and safety. Be sure to make and go to all appointments, and call your doctor if you are having problems. It's also a good idea to know your test results and keep a list of the medicines you take.  How can you care for yourself at home?  ?? Lose weight, if needed. It may reduce the number of times you stop breathing or have slowed breathing.  ?? Go to bed at the same time every night.  ?? Sleep on your side. It may stop mild apnea. If you tend to roll onto your back, sew a pocket in the back of your pajama top. Put a tennis ball into the pocket, and stitch the pocket shut. This will help keep you from sleeping on your back.  ?? Avoid alcohol and medicines such as sleeping pills and sedatives before bed.  ?? Do not smoke. Smoking can make sleep apnea worse. If you need help quitting, talk to your doctor about stop-smoking programs and medicines. These can increase your chances of quitting for good.  ?? Prop up the head of your bed 4 to 6 inches by putting bricks under the legs of the bed.  ?? Treat breathing problems, such as a stuffy nose, caused   by a cold or allergies.  ?? Use a continuous positive airway pressure (CPAP) breathing machine if lifestyle changes do not help your apnea and your doctor recommends it. The machine keeps your airway from closing when you sleep.  ?? If CPAP does not help you, ask your doctor whether you should try other breathing machines. A bilevel positive airway pressure machine has two types of air pressure????????one for breathing in and one for breathing out. Another device raises or lowers air pressure as needed while you breathe.   ?? If your nose feels dry or bleeds when using one of these machines, talk with your doctor about increasing moisture in the air. A humidifier may help.  ?? If your nose is runny or stuffy from using a breathing machine, talk with your doctor about using decongestants or a corticosteroid nasal spray.  When should you call for help?  Watch closely for changes in your health, and be sure to contact your doctor if:  ?? You still have sleep apnea even though you have made lifestyle changes.  ?? You are thinking of trying a device such as CPAP.  ?? You are having problems using a CPAP or similar machine.                Where can you learn more?   Go to http://www.healthwise.net/[facility].  Enter J936 in the search box to learn more about "Sleep Apnea: After Your Visit."   ?? 2006-2010 Healthwise, Incorporated. Care instructions adapted under license by [facility] (which disclaims liability or warranty for this information). This care instruction is for use with your licensed healthcare professional. If you have questions about a medical condition or this instruction, always ask your healthcare professional. Healthwise disclaims any warranty or liability for your use of this information.      PROPER SLEEP HYGIENE    What to avoid  ?? Do not have drinks with caffeine, such as coffee or black tea, for 8 hours before bed.  ?? Do not smoke or use other types of tobacco near bedtime. Nicotine is a stimulant and can keep you awake.  ?? Avoid drinking alcohol late in the evening, because it can cause you to wake in the middle of the night.  ?? Do not eat a big meal close to bedtime. If you are hungry, eat a light snack.  ?? Do not drink a lot of water close to bedtime, because the need to urinate may wake you up during the night.  ?? Do not read or watch TV in bed. Use the bed only for sleeping and sexual activity.  What to try   ?? Go to bed at the same time every night, and wake up at the same time every morning. Do not take naps during the day.  ?? Keep your bedroom quiet, dark, and cool.  ?? Get regular exercise, but not within 3 to 4 hours of your bedtime..  ?? Sleep on a comfortable pillow and mattress.  ?? If watching the clock makes you anxious, turn it facing away from you so you cannot see the time.  ?? If you worry when you lie down, start a worry book. Well before bedtime, write down your worries, and then set the book and your concerns aside.  ?? Try meditation or other relaxation techniques before you go to bed.  ?? If you cannot fall asleep, get up and go to another room until you feel sleepy. Do something relaxing. Repeat your bedtime routine   before you go to bed again.  ?? Make your house quiet and calm about an hour before bedtime. Turn down the lights, turn off the TV, log off the computer, and turn down the volume on music. This can help you relax after a busy day.    Drowsy Driving  The [place] National Highway Traffic Safety Administration cites drowsiness as a causing factor in more than 100,000 police reported crashes annually, resulting in 76,000 injuries and 1,500 deaths. Other surveys suggest 55% of people polled have driven while drowsy in the past year, 23% had fallen asleep but not crashed, 3% crashed, and 2% had and accident due to drowsy driving.  Who is at risk?   Young Drivers: One study of drowsy driving accidents states that 55% of the drivers were under 25 years. Of those, 75% were female.   Shift Workers and Travelers: People who work overnight or travel across time zones frequently are at higher risk of experiencing Circadian Rhythm Disorders. They are trying to work and function when their body is programed to sleep.    Sleep Deprived: Lack of sleep has a serious impact on your ability to pay attention or focus on a task. Consistently getting less than the average of 8 hours your body needs creates partial or cumulative sleep deprivation.   Untreated Sleep Disorders: Sleep Apnea, Narcolepsy, R.L.S., and other sleep disorders (untreated) prevent a person from getting enough restful sleep. This leads to excessive daytime sleepiness and increases the risk for drowsy driving accidents by up to 7 times.  Medications / Alcohol: Even over the counter medications can cause drowsiness. Medications that impair a drivers attention should have a warning label. Alcohol naturally makes you sleepy and on its own can cause accidents. Combined with excessive drowsiness its effects are amplified.   Signs of Drowsy Driving:   * You don't remember driving the last few miles   * You may drift out of your lane   * You are unable to focus and your thoughts wander   * You may yawn more often than normal   * You have difficulty keeping your eyes open / nodding off   * Missing traffic signs, speeding, or tailgating  Prevention-   Good sleep hygiene, lifestyle and behavioral choices have the most impact on drowsy driving. There is no substitute for sleep and the average person requires 8 hours nightly. If you find yourself driving drowsy, stop and sleep. Consider the sleep hygiene tips provided during your visit as well.     Medication Refill Policy: Refills for all medications require 1 week advance notice. Please have your pharmacy fax a refill request. We are unable to fax, or call in "controled substance" medications and you will need to pick these prescriptions up from our office.     MyChart Activation     Thank you for requesting access to MyChart. Please follow the instructions below to securely access and download your online medical record. MyChart allows you to send messages to your doctor, view your test results, renew your prescriptions, schedule appointments, and more.    How Do I Sign Up?    1. In your internet browser, go to [email or web address].  2. Click on the First Time User? Click Here link in the Sign In box. You will see the New Member Sign Up page.  3. Enter your MyChart Access Code exactly as it appears below. You will not need to use this code after you???ve completed the sign-up process. If   you do not sign up before the expiration date, you must request a new code.    MyChart Access Code: Not generated  Current MyChart Status: Active (This is the date your MyChart access code will expire)    4. Enter the last four digits of your Social Security Number (xxxx) and Date of Birth (mm/dd/yyyy) as indicated and click Submit. You will be taken to the next sign-up page.  5. Create a MyChart ID. This will be your MyChart login ID and cannot be changed, so think of one that is secure and easy to remember.  6. Create a MyChart password. You can change your password at any time.  7. Enter your Password Reset Question and Answer. This can be used at a later time if you forget your password.   8. Enter your e-mail address. You will receive e-mail notification when new information is available in MyChart.  9. Click Sign Up. You can now view and download portions of your medical record.  10. Click the Download Summary menu link to download a portable copy of your medical information.    Additional Information    If you have questions, please call [phone]. Remember, MyChart is NOT to be used for urgent needs. For medical emergencies, dial 911.

## 2010-12-22 NOTE — Progress Notes (Signed)
Error

## 2010-12-22 NOTE — Progress Notes (Signed)
Follow up for diabetes.

## 2010-12-22 NOTE — Progress Notes (Signed)
HISTORY OF PRESENT ILLNESS  Katrina [name] is a 45 y.o. female here for a follow up visit.     HPI 1. Diabetes:  Katrina [name] has been doing well. She reports compliance with diet and medications. Her blood sugars at home have been good--often 130s non-fasting. She has no hyper- or hypoglycemic symptoms.  No numbness or tingling. Her last eye exam was this year.      2. Hypertension:  She has been on less and less medication over time, after her adrenal surgery. She exercises some-last week three times, but none this week. Her blood pressures have been 120/80s at home. She has been compliant with her medication.  There have been no known side effects.  She denies chest pain, dyspnea, lower extremity swelling. She was just found to have sleep apnea, so hopes her fatigue will improve.       3. Hyperlipidemia:  Katrina [name] has been compliant with her medication.  There have been no known side effects.  She denies unusual muscle aches, weakness.      4. Hypothyroidism:  She reports compliance with medication.  She has no hot/cold intolerance.  No change in bowel habits.  No palpitations or increased fatigue.            Current Outpatient Prescriptions   Medication Sig   ??? rosuvastatin (CRESTOR) 20 mg tablet Take 1 Tab by mouth nightly.   ??? glucose blood VI test strips (ASCENSIA AUTODISC VI, ONE TOUCH ULTRA TEST VI) strip Two times daily   ??? lansoprazole (PREVACID) 30 mg capsule Take 30 mg by mouth Daily (before breakfast).   ??? cholecalciferol, vitamin D3, (VITAMIN D3) 2,000 unit Tab Take 2,000 Units by mouth.   ??? metFORMIN (GLUCOPHAGE) 500 mg tablet Take 2 Tabs by mouth two (2) times daily (with meals).   ??? b complex vitamins tablet Take 1 Tab by mouth daily.     ??? levothyroxine (LEVOXYL) 88 mcg tablet Take 1 Tab by mouth daily.   ??? lisinopril (PRINIVIL, ZESTRIL) 10 mg tablet Take 1 Tab by mouth daily.    ??? Lancets (ONE TOUCH DELICA) Misc by Does Not Apply route. As directed.  Indications: One Touch Ultra 2 Lancets (Delica)   ??? ascorbic acid (VITAMIN C) 500 mg tablet Take  by mouth.   ??? vitamin E (AQUA GEMS) 400 unit capsule Take 400 Units by mouth two (2) times a day.        Allergies   Allergen Reactions   ??? Aldactone (Spironolactone) Swelling   ??? Codeine Rash and Itching        ROS see HPI    Filed Vitals:    12/22/10 0937 12/22/10 0956   BP: 144/94 140/90   Pulse: 90    Height: 5\' 7"  (1.702 m)    Weight: 208 lb (94.348 kg)    SpO2: 98%       Body mass index is 32.58 kg/(m^2).     Physical Exam   Vitals reviewed.  Constitutional: She appears well-developed and well-nourished. No distress.   Neck: No mass and no thyromegaly present.   Cardiovascular: Normal rate, regular rhythm, normal heart sounds and intact distal pulses.  Exam reveals no gallop and no friction rub.    No murmur heard.  Pulses:       Radial pulses are 2+ on the right side, and 2+ on the left side.        Dorsalis pedis pulses are 2+ on the right side, and  2+ on the left side.        Posterior tibial pulses are 2+ on the right side, and 2+ on the left side.        No peripheral edema noted   Pulmonary/Chest: Effort normal and breath sounds normal. She has no wheezes. She has no rales.       ASSESSMENT and PLAN    Diabetes mellitus--Previously controlled. Home readings are good.  She will have her A1C and microalbumin previously ordered. Continue current regimen pending results. Pneumovax is up to date today. Annual eye exam recommended.     Essential hypertension, benign--Previously better controlled, she will monitor at home, work on exercise, weight loss and start CPAP. Follow up in three months.     Hypothyroidism--Clinically euthyroid. Will check labs and adjust dose only if indicated.    - TSH, 3RD GENERATION  - T4, FREE    Hyperlipidemia--Unknown control, back on Crestor. She will have the lipid panel I previously ordered today.     Unspecified vitamin d deficiency  - VITAMIN D, 25 HYDROXY    Need for pneumococcal vaccination  - PR IMMUNIZ ADMIN,1 SINGLE/COMB VAC/TOXOID  - PNEUMOCOCCAL POLYSACCHARIDE VACCINE, 23-VALENT, ADULT OR IMMUNOSUPPRESSED PT DOSE,        Follow-up Disposition:  Return in about 3 months (around 03/22/2011) for HTN.

## 2010-12-23 LAB — MICROALBUMIN, UR, RAND W/ MICROALB/CREAT RATIO
Creatinine, urine random: 90.8 mg/dL (ref 15.0–278.0)
Microalb/Creat ratio (ug/mg creat.): 7.6 mg/g creat (ref 0.0–30.0)
Microalbumin, urine: 6.9 ug/mL (ref 0.0–17.0)

## 2010-12-23 LAB — LIPID PANEL
Cholesterol, total: 124 mg/dL (ref 100–199)
HDL Cholesterol: 41 mg/dL (ref >39)
LDL, calculated: 61 mg/dL (ref 0–99)
Triglyceride: 110 mg/dL (ref 0–149)
VLDL, calculated: 22 mg/dL (ref 5–40)

## 2010-12-23 LAB — T4, FREE: T4, Free: 1.51 ng/dL (ref 0.82–1.77)

## 2010-12-23 LAB — HEMOGLOBIN A1C WITH EAG: Hemoglobin A1c: 6.5 % — ABNORMAL HIGH (ref 4.8–5.6)

## 2010-12-23 LAB — VITAMIN D, 25 HYDROXY: VITAMIN D, 25-HYDROXY: 34.8 ng/mL (ref 30.0–100.0)

## 2010-12-23 LAB — TSH 3RD GENERATION: TSH: 0.777 u[IU]/mL (ref 0.450–4.500)

## 2011-01-08 ENCOUNTER — Encounter

## 2011-01-08 MED ORDER — LEVOTHYROXINE 88 MCG TAB
88 mcg | ORAL_TABLET | Freq: Every day | ORAL | Status: DC
Start: 2011-01-08 — End: 2012-02-20

## 2011-01-08 MED ORDER — LISINOPRIL 10 MG TAB
10 mg | ORAL_TABLET | Freq: Every day | ORAL | Status: DC
Start: 2011-01-08 — End: 2011-09-21

## 2011-01-08 MED ORDER — METFORMIN 500 MG TAB
500 mg | ORAL_TABLET | Freq: Two times a day (BID) | ORAL | Status: DC
Start: 2011-01-08 — End: 2011-09-21

## 2011-01-11 NOTE — Progress Notes (Signed)
Set up  Order sent to [facility] on 01/11/11. sn

## 2011-01-24 NOTE — Progress Notes (Signed)
Pt new DME company is [employer]

## 2011-02-01 NOTE — Telephone Encounter (Signed)
Unable to perform due to no enrollment in [place] and [employer]. Called and spoke to Ms. Wiechman and she stated that she was set up on 01/31/2011. She stated that her first night with CPAP went well and she feels better today. I encouraged her to continue to use CPAP each day for at least 4 hours and to give [place] a call if she has any problems. Her 1st adherence appointment has been rescheduled to 03/16/11 and a 2nd tickler to complete a two week download.

## 2011-02-16 NOTE — Telephone Encounter (Signed)
Called and left a message for Ms. Trull, regarding CPAP usage and comfort.

## 2011-02-16 NOTE — Telephone Encounter (Signed)
Spoke to Katrina [name] and she stated that she is doing well with CPAP and is feeling better during the day. Katrina [name] had mask change from nasal pillows to a nasal mask, this is more comfortable for her. I encouraged the patient to give [place] a call if she has any questions or concerns.

## 2011-03-16 NOTE — Progress Notes (Signed)
[address] [place], [place] [address]  Tel.  [phone]  Fax. [phone] [address]  [place], [place] [address]  Tel.  [phone]  Fax. [phone] [address]  [place], [place] [address]  Tel.  [phone]  Fax. [phone]     S>Katrina [name] is a 46 y.o. female seen for a positive airway pressure follow-up.  She reports no problems using the device.  She is 100% compliant over the past 30 days.  The following problems are identified:    Drowsiness no Problems exhaling no   Snoring no Forget to put on no   Mask Comfortable yes Can't fall asleep no   Dry Mouth no Mask falls off no   Air Leaking no Frequent awakenings no         She admits that her sleep has improved.    Allergies   Allergen Reactions   ??? Aldactone (Spironolactone) Swelling   ??? Codeine Rash and Itching       She has a current medication list which includes the following prescription(s): levothyroxine, lisinopril, metformin, rosuvastatin, glucose blood vi test strips, lansoprazole, cholecalciferol (vitamin d3), b complex vitamins, lancets, ascorbic acid, and vitamin e..      She  has a past medical history of S/P tonsillectomy (07/25/2007); S/P appendectomy (07/25/2007); Obesity (07/25/2007); Hyperlipidemia (07/25/2007); Fatty liver (07/25/2007); Elevated triglycerides with high cholesterol (07/25/2007); Depression (07/25/2007); GE reflux (07/25/2007); HTN (07/25/2007); Hypothyroidism (07/25/2007); Pap smear for cervical cancer screening (08/02/10); mammogram (7/12); Adrenal adenoma (12/12/2009); and Chest pain, unspecified (12/12/2009).    Epworth Sleepiness Score: 2    and Modified F.O.S.Q. Score Total / 2: 20    which reflect improved sleep quality over therapy time.    O>    BP 150/84   Pulse 86   Ht 5\' 7"  (1.702 m)   Wt 208 lb (94.348 kg)   BMI 32.58 kg/m2   SpO2 98%        General:   Alert, oriented, not in distress   Neck:   No JVD    Chest/Lungs:  symetrical lung expansion , no accessory muscle use     Extremities:  no obvious rashes , negative edema    Neuro:  No focal deficits ; No obvious tremor    Psych:  Normal affect ,  Normal countenance ;           A>  1. OSA on CPAP    2. Diabetes mellitus    3. Hypothyroidism    4. Depression      AHI = 5.2.  On CPAP :  8 cmH2O.    Compliant:      yes    Therapeutic Response:  Positive    P>      * Follow-up Disposition:  Return in about 1 year (around 03/15/2012).    * She was asked to contact our office for any problems regarding his PAP therapy.    * Counseling was provided regarding he importance of regular PAP use and on proper sleep hygiene and safe driving.    * Re-enforced proper and regular cleaning for the device.    Thank you for allowing [place] to participate in your patient's medical care.

## 2011-03-16 NOTE — Patient Instructions (Signed)
[address]  [place] [address]  Tel.  [phone]  Fax. [phone] [address]  [place] [address]  Tel.  [phone]  Fax. [phone] [address] [place] [address]  Tel.  [phone]  Fax. [phone]     Learning About CPAP for Sleep Apnea  What is CPAP?              CPAP is a small machine that you use at home every night while you sleep. It increases air pressure in your throat to keep your airway open. When you have sleep apnea, this can help you sleep better so you feel much better. CPAP stands for "continuous positive airway pressure."  The CPAP machine will have one of the following:  ?? A mask that covers your nose and mouth  ?? Prongs that fit into your nose  ?? A mask that covers your nose only, the most common type. This type is called NCPAP. The N stands for "nasal."  Why is it done?  CPAP is usually the best treatment for obstructive sleep apnea. It is the first treatment choice and the most widely used. Your doctor may suggest CPAP if you have:  ?? Moderate to severe sleep apnea.  ?? Sleep apnea and coronary artery disease (CAD) or heart failure.  How does it help?  ?? CPAP can help you have more normal sleep, so you feel less sleepy and more alert during the daytime.  ?? CPAP may help keep heart failure or other heart problems from getting worse.  ?? NCPAP may help lower your blood pressure.  ?? If you use CPAP, your bed partner may also sleep better because you are not snoring or restless.  What are the side effects?  Some people who use CPAP have:  ?? A dry or stuffy nose and a sore throat.  ?? Irritated skin on the face.  ?? Sore eyes.  ?? Bloating.  If you have any of these problems, work with your doctor to fix them. Here are some things you can try:  ?? Be sure the mask or nasal prongs fit well.  ?? See if your doctor can adjust the pressure of your CPAP.  ?? If your nose is dry, try a humidifier.  ?? If your nose is runny or stuffy, try  decongestant medicine or a steroid nasal spray.  If these things do not help, you might try a different type of machine. Some machines have air pressure that adjusts on its own. Others have air pressures that are different when you breathe in than when you breathe out. This may reduce discomfort caused by too much pressure in your nose.               Where can you learn more?   Go to http://www.healthwise.net/[facility]  Enter X266 in the search box to learn more about "Learning About CPAP for Sleep Apnea."   ?? 2006-2011 Healthwise, Incorporated. Care instructions adapted under license by [facility] (which disclaims liability or warranty for this information). This care instruction is for use with your licensed healthcare professional. If you have questions about a medical condition or this instruction, always ask your healthcare professional. Healthwise, Incorporated disclaims any warranty or liability for your use of this information.  Content Version: 8.9.83828; Last Revised: January 19, 2008  PROPER SLEEP HYGIENE    What to avoid  ?? Do not have drinks with caffeine, such as coffee or black tea,   for 8 hours before bed.  ?? Do not smoke or use other types of tobacco near bedtime. Nicotine is a stimulant and can keep you awake.  ?? Avoid drinking alcohol late in the evening, because it can cause you to wake in the middle of the night.  ?? Do not eat a big meal close to bedtime. If you are hungry, eat a light snack.  ?? Do not drink a lot of water close to bedtime, because the need to urinate may wake you up during the night.  ?? Do not read or watch TV in bed. Use the bed only for sleeping and sexual activity.  What to try  ?? Go to bed at the same time every night, and wake up at the same time every morning. Do not take naps during the day.  ?? Keep your bedroom quiet, dark, and cool.  ?? Get regular exercise, but not within 3 to 4 hours of your bedtime..  ?? Sleep on a comfortable pillow and mattress.  ?? If watching the  clock makes you anxious, turn it facing away from you so you cannot see the time.  ?? If you worry when you lie down, start a worry book. Well before bedtime, write down your worries, and then set the book and your concerns aside.  ?? Try meditation or other relaxation techniques before you go to bed.  ?? If you cannot fall asleep, get up and go to another room until you feel sleepy. Do something relaxing. Repeat your bedtime routine before you go to bed again.  ?? Make your house quiet and calm about an hour before bedtime. Turn down the lights, turn off the TV, log off the computer, and turn down the volume on music. This can help you relax after a busy day.    Drowsy Driving: The [place] National Highway Traffic Safety Administration cites drowsiness as a causing factor in more than 100,000 police reported crashes annually, resulting in 76,000 injuries and 1,500 deaths. Other surveys suggest 55% of people polled have driven while drowsy in the past year, 23% had fallen asleep but not crashed, 3% crashed, and 2% had and accident due to drowsy driving.  Who is at risk?   Young Drivers: One study of drowsy driving accidents states that 55% of the drivers were under 25 years. Of those, 75% were female.   Shift Workers and Travelers: People who work overnight or travel across time zones frequently are at higher risk of experiencing Circadian Rhythm Disorders. They are trying to work and function when their body is programed to sleep.   Sleep Deprived: Lack of sleep has a serious impact on your ability to pay attention or focus on a task. Consistently getting less than the average of 8 hours your body needs creates partial or cumulative sleep deprivation.   Untreated Sleep Disorders: Sleep Apnea, Narcolepsy, R.L.S., and other sleep disorders (untreated) prevent a person from getting enough restful sleep. This leads to excessive daytime sleepiness and increases the risk for drowsy driving accidents by up to 7 times.  Medications  / Alcohol: Even over the counter medications can cause drowsiness. Medications that impair a drivers attention should have a warning label. Alcohol naturally makes you sleepy and on its own can cause accidents. Combined with excessive drowsiness its effects are amplified.   Signs of Drowsy Driving:   * You don't remember driving the last few miles   * You may drift out of your lane   *   You are unable to focus and your thoughts wander   * You may yawn more often than normal   * You have difficulty keeping your eyes open / nodding off   * Missing traffic signs, speeding, or tailgating  Prevention-   Good sleep hygiene, lifestyle and behavioral choices have the most impact on drowsy driving. There is no substitute for sleep and the average person requires 8 hours nightly. If you find yourself driving drowsy, stop and sleep. Consider the sleep hygiene tips provided during your visit as well.     Medication Refill Policy: Refills for all medications require 1 week advance notice. Please have your pharmacy fax a refill request. We are unable to fax, or call in "controled substance" medications and you will need to pick these prescriptions up from our office.     MyChart Activation    Thank you for requesting access to MyChart. Please follow the instructions below to securely access and download your online medical record. MyChart allows you to send messages to your doctor, view your test results, renew your prescriptions, schedule appointments, and more.    How Do I Sign Up?    1. In your internet browser, go to [email or web address].  2. Click on the First Time User? Click Here link in the Sign In box. You will see the New Member Sign Up page.  3. Enter your MyChart Access Code exactly as it appears below. You will not need to use this code after you???ve completed the sign-up process. If you do not sign up before the expiration date, you must request a new code.    MyChart Access Code: Not generated   Current MyChart Status: Active (This is the date your MyChart access code will expire)    4. Enter the last four digits of your Social Security Number (xxxx) and Date of Birth (mm/dd/yyyy) as indicated and click Submit. You will be taken to the next sign-up page.  5. Create a MyChart ID. This will be your MyChart login ID and cannot be changed, so think of one that is secure and easy to remember.  6. Create a MyChart password. You can change your password at any time.  7. Enter your Password Reset Question and Answer. This can be used at a later time if you forget your password.   8. Enter your e-mail address. You will receive e-mail notification when new information is available in MyChart.  9. Click Sign Up. You can now view and download portions of your medical record.  10. Click the Download Summary menu link to download a portable copy of your medical information.    Additional Information    If you have questions, please call [phone]. Remember, MyChart is NOT to be used for urgent needs. For medical emergencies, dial 911.

## 2011-03-23 NOTE — Progress Notes (Signed)
HISTORY OF PRESENT ILLNESS  Katrina [name] is a 46 y.o. female here for a follow up visit.     HPI 1. Hypertension: Katrina [name] has been doing very well. She has felt more energized on the CPAP.  She is exercising, is now running about 3-4 times per week.  She has been compliant with medications.  There have been no known side effects.  Her blood pressure has been 120/80s at home. She denies chest pain, dyspnea--though she feels she has to stop running sometimes to jog and take deep breaths--lower extremity swelling.        Current Outpatient Prescriptions   Medication Sig   ??? levothyroxine (LEVOXYL) 88 mcg tablet Take 1 Tab by mouth daily.   ??? lisinopril (PRINIVIL, ZESTRIL) 10 mg tablet Take 1 Tab by mouth daily.   ??? metFORMIN (GLUCOPHAGE) 500 mg tablet Take 2 Tabs by mouth two (2) times daily (with meals).   ??? rosuvastatin (CRESTOR) 20 mg tablet Take 1 Tab by mouth nightly.   ??? glucose blood VI test strips (ASCENSIA AUTODISC VI, ONE TOUCH ULTRA TEST VI) strip Two times daily   ??? lansoprazole (PREVACID) 30 mg capsule Take 30 mg by mouth Daily (before breakfast).   ??? cholecalciferol, vitamin D3, (VITAMIN D3) 2,000 unit Tab Take 2,000 Units by mouth.   ??? b complex vitamins tablet Take 1 Tab by mouth daily.     ??? Lancets (ONE TOUCH DELICA) Misc by Does Not Apply route. As directed.  Indications: One Touch Ultra 2 Lancets (Delica)   ??? ascorbic acid (VITAMIN C) 500 mg tablet Take  by mouth.   ??? vitamin E (AQUA GEMS) 400 unit capsule Take 400 Units by mouth two (2) times a day.          Allergies   Allergen Reactions   ??? Aldactone (Spironolactone) Swelling   ??? Codeine Rash and Itching        ROS see HPI    BP 130/79   Pulse 74   Ht 5\' 7"  (1.702 m)   Wt 207 lb (93.895 kg)   BMI 32.42 kg/m2   LMP 03/05/2011     Physical Exam   Vitals reviewed.  Constitutional: She appears well-developed and well-nourished. No distress.   Cardiovascular: Normal rate, regular rhythm, normal heart sounds and intact distal pulses.  Exam  reveals no gallop and no friction rub.    No murmur heard.  Pulses:       Radial pulses are 2+ on the right side, and 2+ on the left side.        Dorsalis pedis pulses are 2+ on the right side, and 2+ on the left side.        Posterior tibial pulses are 2+ on the right side, and 2+ on the left side.        No peripheral edema noted   Pulmonary/Chest: Effort normal and breath sounds normal. She has no wheezes. She has no rales.       ASSESSMENT and PLAN    Essential hypertension, benign--Controlled, improved. She was encouraged to continue exercise, weight loss. Continue current medication. Labs today.  - METABOLIC PANEL, BASIC        Follow-up Disposition:  Return in about 3 months (around 06/23/2011) for diabetes, HTN.

## 2011-03-23 NOTE — Progress Notes (Signed)
Pt is here for a follow up.

## 2011-03-24 LAB — METABOLIC PANEL, BASIC
BUN/Creatinine ratio: 20 (ref 9–23)
BUN: 13 mg/dL (ref 6–24)
CO2: 19 mmol/L — ABNORMAL LOW (ref 20–32)
Calcium: 9.8 mg/dL (ref 8.7–10.2)
Chloride: 103 mmol/L (ref 97–108)
Creatinine: 0.64 mg/dL (ref 0.57–1.00)
GFR est non-AA: 110 mL/min/{1.73_m2} (ref >59)
Glucose: 113 mg/dL — ABNORMAL HIGH (ref 65–99)
Potassium: 4.8 mmol/L (ref 3.5–5.2)
Sodium: 134 mmol/L (ref 134–144)
eGFR If African American: 126 mL/min/{1.73_m2} (ref >59)

## 2011-03-29 ENCOUNTER — Encounter

## 2011-03-29 NOTE — Telephone Encounter (Signed)
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               ----- Message -----          From: Keane Scrape, LPN          Sent: 03/28/2011   2:23 PM            To: Maricela Curet, MD       Subject: FW: TDD - needs order for PT [facility]ed [facility]              Does she need an appt or can we order?       ----- Message -----          From: Fernanda Drum, LPN          Sent: 03/28/2011   1:53 PM            To: [facility] [employer]       Subject: FW: TDD - needs order for PT faxed to [facility]                            ----- Message -----          From: Alfonse Spruce          Sent: 03/28/2011   1:45 PM            To: [facility] Pool       Subject: TDD - needs order for PT faxed to [facility]              Pt was taking PT from [facility] in 2012 for her back       She needs and order faxed to [facility]-Fax [phone]       Attn Cassandra if Pt can go back to PT as she is having back trouble again with the degenerative disc.       Advise [phone]

## 2011-03-29 NOTE — Telephone Encounter (Signed)
Advised pt MD has ordered PT for her to have done at [facility]. Will fax to [phone] Attn Cassandra tomorrow once MD signs order.

## 2011-04-03 NOTE — Progress Notes (Signed)
Order faxed on 03/30/11  With Attn Elonda Husky

## 2011-04-06 NOTE — Telephone Encounter (Signed)
try Mucinex--no decongestant, nasal saline irrigation. Follow up next week if not better.       LM for pt to try above.

## 2011-04-06 NOTE — Telephone Encounter (Signed)
Message copied by Keane Scrape on Fri Apr 06, 2011  2:11 PM  ------       Message from: [place], [place] A       Created: Fri Apr 06, 2011  2:01 PM       Regarding: tdd          rx         [phone] pt says she is getting a cold and wonders if you would call in an antibiotic to [facility].  Refused appt.

## 2011-04-06 NOTE — Telephone Encounter (Signed)
Pt states she has sinus pain and has green mucus when blowing her nose.  Sent to Dr. Maurine Minister

## 2011-04-16 LAB — AMB POC RAPID STREP A: Group A Strep Ag: NEGATIVE

## 2011-04-16 MED ORDER — FLUCONAZOLE 150 MG TAB
150 mg | ORAL_TABLET | Freq: Every day | ORAL | Status: AC
Start: 2011-04-16 — End: 2011-04-17

## 2011-04-16 MED ORDER — AMOXICILLIN CLAVULANATE 875 MG-125 MG TAB
875-125 mg | ORAL_TABLET | Freq: Two times a day (BID) | ORAL | Status: AC
Start: 2011-04-16 — End: 2011-04-26

## 2011-04-16 NOTE — Progress Notes (Signed)
HISTORY OF PRESENT ILLNESS  Katrina [name] is a 46 y.o. female here for an acute visit--cough.     HPI 1. Congestion: Katrina [name] has been sick for the past ten days. She has symptoms of congestion, productive cough, burning sore throat, green drainage. She denies fevers, chills, chest pain, dyspnea, wheezing. She has tried nasal decongestant and Mucinex DM--but was still up all night last night coughing. She called out of work today. She has been exposed to strep--her nephew was around a week ago, and he is now being treated for strep throat.       Current Outpatient Prescriptions   Medication Sig   ??? GUAIFENESIN/DEXTROMETHORPHAN (MUCINEX DM PO) Take  by mouth.   ??? levothyroxine (LEVOXYL) 88 mcg tablet Take 1 Tab by mouth daily.   ??? lisinopril (PRINIVIL, ZESTRIL) 10 mg tablet Take 1 Tab by mouth daily.   ??? metFORMIN (GLUCOPHAGE) 500 mg tablet Take 2 Tabs by mouth two (2) times daily (with meals).   ??? rosuvastatin (CRESTOR) 20 mg tablet Take 1 Tab by mouth nightly.   ??? glucose blood VI test strips (ASCENSIA AUTODISC VI, ONE TOUCH ULTRA TEST VI) strip Two times daily   ??? lansoprazole (PREVACID) 30 mg capsule Take 30 mg by mouth Daily (before breakfast).   ??? cholecalciferol, vitamin D3, (VITAMIN D3) 2,000 unit Tab Take 2,000 Units by mouth.   ??? b complex vitamins tablet Take 1 Tab by mouth daily.     ??? Lancets (ONE TOUCH DELICA) Misc by Does Not Apply route. As directed.  Indications: One Touch Ultra 2 Lancets (Delica)   ??? ascorbic acid (VITAMIN C) 500 mg tablet Take  by mouth.   ??? vitamin E (AQUA GEMS) 400 unit capsule Take 400 Units by mouth two (2) times a day.        Allergies   Allergen Reactions   ??? Aldactone (Spironolactone) Swelling   ??? Codeine Rash and Itching        ROS see HPI    Filed Vitals:    04/16/11 1057 04/16/11 1115   BP: 156/84 138/90   Pulse: 82    Temp: 98 ??F (36.7 ??C)    TempSrc: Oral    Resp: 16    Height: 5\' 5"  (1.651 m)    Weight: 202 lb 3.2 oz (91.717 kg)    SpO2: 99%       Body mass  index is 33.65 kg/(m^2).     Physical Exam   Vitals reviewed.  Constitutional: She appears well-developed and well-nourished.   HENT:   Right Ear: External ear normal. No swelling. Tympanic membrane is not erythematous. No middle ear effusion.   Left Ear: External ear normal. No swelling. Tympanic membrane is not erythematous.  No middle ear effusion.   Nose: Nose normal.   Mouth/Throat: Oropharynx is clear and moist. No oropharyngeal exudate.   Eyes: Conjunctivae are normal.   Cardiovascular: Normal rate and regular rhythm.    Pulmonary/Chest: Effort normal and breath sounds normal. She has no wheezes. She has no rales.   Lymphadenopathy:     She has no cervical adenopathy.     Rapid strep--negative.     ASSESSMENT and PLAN    Uri (upper respiratory infection)--Subacute. Cannot rule out sinusitis. Will treat with antibiotics today. She may continue her current symptomatic treatment, as long as she monitors her blood pressure, and it is no higher than it is today.   - amoxicillin-clavulanate (AUGMENTIN) 875-125 mg per tablet; Take 1 Tab  by mouth every twelve (12) hours for 10 days.    Throat burning-acute, due to URI, drainage. Will treat as above.   - AMB POC RAPID STREP A    Vaginal candidiasis--Frequent with antibiotics. She will use Diflucan only if needed.   - fluconazole (DIFLUCAN) 150 mg tablet; Take 1 Tab by mouth daily for 1 day.      Follow-up Disposition:  Return in about 10 weeks (around 06/25/2011) for BP.

## 2011-04-16 NOTE — Progress Notes (Signed)
Patient is here today with URI x 10 days.  Head congestion, dry cough worse at night. Burning in throat, sinus and pressure.  Patient has been exposed to strep.

## 2011-06-22 NOTE — Patient Instructions (Signed)
[place]: Dr. Port, Dr. Beltran, Dr. Shah  [facility]  Phone: [phone]    [place]: Dr. Devota Young  [facility]  Phone: [phone]      [place]: Dr. Ruth Latham, Dr. Monica Forth  [facility]  [phone]   [address] [facility]   [place] [address]

## 2011-06-22 NOTE — Progress Notes (Signed)
Pt is here for a follow up.

## 2011-06-22 NOTE — Progress Notes (Signed)
HISTORY OF PRESENT ILLNESS  Katrina [name] is a 46 y.o. female here for a follow up of chronic issues.     HPI 1. Diabetes:  Katrina [name] is doing well. She has lost weight with Weight Watcher's and exercising 4 days per week. She reports compliance with diet and medications, but not checking her blood sugars at home that much. She has had no hyper- or hypoglycemic symptoms.  No numbness or tingling.      2. Hypertension:  She has been compliant with medications.  There have been no known side effects. Her blood pressure readings have been 120/70s. She denies chest pain, dyspnea, lower extremity swelling. Her breathing has been better as she started running again.     3. Hypothyroidism:  Katrina [name] reports compliance with her medication.  She feels really good, has no hot/cold intolerance.  No change in bowel habits.  No palpitations or fatigue.       Current Outpatient Prescriptions   Medication Sig   ??? GUAIFENESIN/DEXTROMETHORPHAN (MUCINEX DM PO) Take  by mouth.   ??? levothyroxine (LEVOXYL) 88 mcg tablet Take 1 Tab by mouth daily.   ??? lisinopril (PRINIVIL, ZESTRIL) 10 mg tablet Take 1 Tab by mouth daily.   ??? metFORMIN (GLUCOPHAGE) 500 mg tablet Take 2 Tabs by mouth two (2) times daily (with meals).   ??? rosuvastatin (CRESTOR) 20 mg tablet Take 1 Tab by mouth nightly.   ??? glucose blood VI test strips (ASCENSIA AUTODISC VI, ONE TOUCH ULTRA TEST VI) strip Two times daily   ??? lansoprazole (PREVACID) 30 mg capsule Take 30 mg by mouth Daily (before breakfast).   ??? cholecalciferol, vitamin D3, (VITAMIN D3) 2,000 unit Tab Take 2,000 Units by mouth.   ??? b complex vitamins tablet Take 1 Tab by mouth daily.     ??? Lancets (ONE TOUCH DELICA) Misc by Does Not Apply route. As directed.  Indications: One Touch Ultra 2 Lancets (Delica)   ??? ascorbic acid (VITAMIN C) 500 mg tablet Take  by mouth.   ??? vitamin E (AQUA GEMS) 400 unit capsule Take 400 Units by mouth two (2) times a day.        Allergies   Allergen Reactions   ???  Aldactone (Spironolactone) Swelling   ??? Codeine Rash and Itching        ROS see HPI    BP 120/81   Pulse 74   Ht 5\' 5"  (1.651 m)   Wt 186 lb (84.369 kg)   BMI 30.95 kg/m2   LMP 06/21/2011       Physical Exam   Vitals reviewed.  Constitutional: She appears well-developed and well-nourished. No distress.   Cardiovascular: Normal rate, regular rhythm, normal heart sounds and intact distal pulses.  Exam reveals no gallop and no friction rub.    No murmur heard.  Pulses:       Radial pulses are 2+ on the right side, and 2+ on the left side.        Dorsalis pedis pulses are 2+ on the right side, and 2+ on the left side.        Posterior tibial pulses are 2+ on the right side, and 2+ on the left side.        No peripheral edema noted   Pulmonary/Chest: Effort normal and breath sounds normal. She has no wheezes. She has no rales.       ASSESSMENT and PLAN    Diabetes mellitus--Controlled. She is doing very well with  weight loss. Discussed the patient's BMI with her.  The BMI follow up plan is as follows: BMI is out of normal parameters and plan is as follows: continue exercise and commercial weight management program:  Weight Watchers. Pneumovax is up to date. Annual eye exam recommended. Will assess labs today.  - HEMOGLOBIN A1C    Essential hypertension, benign--Controlled. Continue current medication. Labs at follow up.     Hypothyroidism--Clinically euthyroid. Will check labs and adjust dose only if indicated.    - TSH, 3RD GENERATION  - T4, FREE    Hyperlipidemia--Previously controlled.  Labs due now. Will review labs and continue the current dose of medication pending results.   - LIPID PANEL WITH LDL/HDL RATIO        Follow-up Disposition:  Return in about 6 months (around 12/22/2011) for HTN, DM, thyroid, cholesterol.

## 2011-06-28 LAB — T4, FREE: T4, Free: 1.74 ng/dL (ref 0.82–1.77)

## 2011-06-28 LAB — LIPID PANEL WITH LDL/HDL RATIO
Cholesterol, total: 118 mg/dL (ref 100–199)
HDL Cholesterol: 43 mg/dL (ref >39)
LDL, calculated: 60 mg/dL (ref 0–99)
LDL/HDL Ratio: 1.4 ratio units (ref 0.0–3.2)
Triglyceride: 76 mg/dL (ref 0–149)
VLDL, calculated: 15 mg/dL (ref 5–40)

## 2011-06-28 LAB — HEMOGLOBIN A1C WITH EAG: Hemoglobin A1c: 6.1 % — ABNORMAL HIGH (ref 4.8–5.6)

## 2011-06-28 LAB — TSH 3RD GENERATION: TSH: 0.726 u[IU]/mL (ref 0.450–4.500)

## 2011-08-06 MED ORDER — TRIMETHOPRIM-SULFAMETHOXAZOLE 160 MG-800 MG TAB
160-800 mg | ORAL_TABLET | Freq: Two times a day (BID) | ORAL | Status: DC
Start: 2011-08-06 — End: 2011-10-02

## 2011-08-06 NOTE — Progress Notes (Signed)
Subjective:     Katrina [name] is a 46 y.o. female who complains of dysuria, frequency, urgency for 7 days. Patient denies flank pain, vomiting, fever, unusual vaginal discharge. Patient does not have a history of recurrent UTI.  Patient does not have a history of pyelonephritis.    Patient Active Problem List   Diagnoses Code   ??? Obesity 278.00   ??? Hyperlipidemia 272.4   ??? Hypothyroidism 244.9   ??? GE Reflux 530.81   ??? Depression 311   ??? Fatty Liver 571.8   ??? Diabetes Mellitus 250.00   ??? Essential Hypertension, Benign 401.1   ??? Hypokalemia 276.8   ??? Abnormal EKG 794.31   ??? Sleep apnea 780.57   ??? Thoracic spondylosis 721.2     Patient Active Problem List   Diagnoses Date Noted   ??? Thoracic spondylosis 03/28/2011   ??? Sleep apnea 12/22/2010   ??? Abnormal EKG 12/12/2009   ??? Hypokalemia 07/13/2008   ??? Essential Hypertension, Benign 03/19/2008   ??? Diabetes Mellitus 12/19/2007   ??? Obesity 07/25/2007   ??? Hyperlipidemia 07/25/2007   ??? Hypothyroidism 07/25/2007   ??? GE Reflux 07/25/2007   ??? Depression 07/25/2007   ??? Fatty Liver 07/25/2007     Current Outpatient Prescriptions   Medication Sig Dispense Refill   ??? levothyroxine (LEVOXYL) 88 mcg tablet Take 1 Tab by mouth daily.  90 Tab  3   ??? lisinopril (PRINIVIL, ZESTRIL) 10 mg tablet Take 1 Tab by mouth daily.  90 Tab  3   ??? metFORMIN (GLUCOPHAGE) 500 mg tablet Take 2 Tabs by mouth two (2) times daily (with meals).  360 Tab  3   ??? rosuvastatin (CRESTOR) 20 mg tablet Take 1 Tab by mouth nightly.  90 Tab  1   ??? glucose blood VI test strips (ASCENSIA AUTODISC VI, ONE TOUCH ULTRA TEST VI) strip Two times daily  3 Package  3   ??? cholecalciferol, vitamin D3, (VITAMIN D3) 2,000 unit Tab Take 2,000 Units by mouth.       ??? b complex vitamins tablet Take 1 Tab by mouth daily.         ??? Lancets (ONE TOUCH DELICA) Misc by Does Not Apply route. As directed.  Indications: One Touch Ultra 2 Lancets (Delica)  3 Package  3   ??? ascorbic acid (VITAMIN C) 500 mg tablet Take  by mouth.        ??? vitamin E (AQUA GEMS) 400 unit capsule Take 400 Units by mouth two (2) times a day.       ??? GUAIFENESIN/DEXTROMETHORPHAN (MUCINEX DM PO) Take  by mouth.       ??? lansoprazole (PREVACID) 30 mg capsule Take 30 mg by mouth Daily (before breakfast).         Allergies   Allergen Reactions   ??? Aldactone (Spironolactone) Swelling   ??? Codeine Rash and Itching     Past Medical History   Diagnosis Date   ??? S/P tonsillectomy 07/25/2007   ??? S/P appendectomy 07/25/2007   ??? Obesity 07/25/2007   ??? Hyperlipidemia 07/25/2007   ??? Fatty liver 07/25/2007   ??? Elevated triglycerides with high cholesterol 07/25/2007   ??? Depression 07/25/2007   ??? GE reflux 07/25/2007   ??? HTN 07/25/2007   ??? Hypothyroidism 07/25/2007   ??? Pap smear for cervical cancer screening 08/02/10   ??? Hx of mammogram 7/12     MRI, 6 mo   ??? Adrenal adenoma 12/12/2009   ??? Chest pain,  unspecified 12/12/2009     Past Surgical History   Procedure Date   ??? Pr appendectomy    ??? Hx tonsillectomy    ??? Hx breast reduction    ??? Hx cholecystectomy    ??? Pr abdomen surgery proc unlisted 08/01/09     laprascopic right adrenalectomy     Family History   Problem Relation Age of Onset   ??? Hypertension Mother    ??? Breast Cancer Mother    ??? Stroke Mother    ??? Diabetes Father    ??? Hypertension Father    ??? Stroke Sister      History   Substance Use Topics   ??? Smoking status: Former Smoker -- 0.3 packs/day for 10 years     Types: Cigarettes     Quit date: 02/08/2009   ??? Smokeless tobacco: Never Used   ??? Alcohol Use: 0.5 oz/week     1 Glasses of wine per week        Review of Systems  Pertinent items are noted in HPI.    Objective:     BP 121/75   Pulse 79   Temp(Src) 98.4 ??F (36.9 ??C) (Oral)   Ht 5\' 5"  (1.651 m)   Wt 188 lb 3.2 oz (85.367 kg)   BMI 31.32 kg/m2   SpO2 95%  General:  alert, cooperative, no distress   Abdomen: soft, nontender, nondistended, no masses or organomegaly.    Back:  CVA tenderness absent   GU:  defer exam     Laboratory:   Urine dipstick shows negative for all components.     Micro exam: not done.     Assessment/Plan:     UTI     1. TMP/SMX  2. Maintain adequate hydration  3. May use OTC pyridium as desired, which will turn urine orange/red color  4. Follow up if symptoms not improving, and prn.    Marland Kitchen

## 2011-08-06 NOTE — Patient Instructions (Signed)
PRESCRIPTION REFILL POLICY    [facility] Statement to Patients  January 1,2013       In an effort to ensure the large volume of patient prescription refills is processed in the most efficient and expeditious manner, we are asking our patients to assist us by calling your Pharmacy for all prescription refills, this will include also your  Mail Order Pharmacy. The pharmacy will contact our office electronically to continue the refill process.    Please do not wait until the last minute to call your pharmacy. We need at least 48 hours (2days) to fill prescriptions. We also encourage you to call your pharmacy before going to pick up your prescription to make sure it is ready.     With regard to controlled substance prescription refill requests (narcotic refills) that need to be picked up at our office, we ask your cooperation by providing us with at least 72 hours (3days) notice that you will need a refill.    We will not refill narcotic prescription refill requests after 4:00pm on any weekday, Monday through Thursday, or after 2:00pm on Fridays, or on the weekends.      We encourage everyone to explore another way of getting your prescription refill request processed using MyChart, our patient web portal through our electronic medical record system. MyChart is an efficient and effective way to communicate your medication request directly to the office and  downloadable as an app on your smart phone . MyChart also features a review functionality that allows you to view your medication list as well as leave messages for your physician. Are you ready to get connected? If so please review the attatched instructions or speak to any of our staff to get you set up right away!    Thank you so much for your cooperation. Should you have any questions please contact our Practice Administrator.    The Physicians and Staff, [facility]

## 2011-08-27 MED ORDER — CRESTOR 20 MG TABLET
20 mg | ORAL_TABLET | ORAL | Status: DC
Start: 2011-08-27 — End: 2011-08-31

## 2011-08-31 NOTE — Telephone Encounter (Signed)
Sent to mail order per [facility] Dr Maurine Minister, called pt to advised of change

## 2011-09-21 LAB — AMB POC HEMOGLOBIN A1C: Hemoglobin A1c (POC): 5.5 % (ref 4.8–5.6)

## 2011-09-21 NOTE — Patient Instructions (Signed)
PRESCRIPTION REFILL POLICY    [facility] Statement to Patients  January 1,2013       In an effort to ensure the large volume of patient prescription refills is processed in the most efficient and expeditious manner, we are asking our patients to assist us by calling your Pharmacy for all prescription refills, this will include also your  Mail Order Pharmacy. The pharmacy will contact our office electronically to continue the refill process.    Please do not wait until the last minute to call your pharmacy. We need at least 48 hours (2days) to fill prescriptions. We also encourage you to call your pharmacy before going to pick up your prescription to make sure it is ready.     With regard to controlled substance prescription refill requests (narcotic refills) that need to be picked up at our office, we ask your cooperation by providing us with at least 72 hours (3days) notice that you will need a refill.    We will not refill narcotic prescription refill requests after 4:00pm on any weekday, Monday through Thursday, or after 2:00pm on Fridays, or on the weekends.      We encourage everyone to explore another way of getting your prescription refill request processed using MyChart, our patient web portal through our electronic medical record system. MyChart is an efficient and effective way to communicate your medication request directly to the office and  downloadable as an app on your smart phone . MyChart also features a review functionality that allows you to view your medication list as well as leave messages for your physician. Are you ready to get connected? If so please review the attatched instructions or speak to any of our staff to get you set up right away!    Thank you so much for your cooperation. Should you have any questions please contact our Practice Administrator.    The Physicians and Staff, [facility]

## 2011-09-21 NOTE — Progress Notes (Signed)
HISTORY OF PRESENT ILLNESS  Katrina [name] is a 46 y.o. female here for a follow up of chronic issues.     HPI 1. Diabetes:  Katrina [name] is doing well. Katrina [name] has lost weight with Weight Watcher's and exercising 4 days per week and down 10 pounds since last visit. Katrina [name] reports compliance with diet and medications,  Katrina [name] has had no hyper- or hypoglycemic symptoms.  No numbness or tingling.      2. Hypertension:  Katrina [name] has been compliant with medications.  There have been some orthostatic sx on rising. Her blood pressure readings have been 110/70s. Katrina [name] denies chest pain, dyspnea, lower extremity swelling.      3. Hypothyroidism:  Katrina [name] reports compliance with her medication.  Katrina [name] feels really good, has no hot/cold intolerance.  No change in bowel habits.  No palpitations or fatigue.       Current Outpatient Prescriptions   Medication Sig   ??? atorvastatin (LIPITOR) 40 mg tablet Take 1 Tab by mouth daily.   ??? levothyroxine (LEVOXYL) 88 mcg tablet Take 1 Tab by mouth daily.   ??? lisinopril (PRINIVIL, ZESTRIL) 10 mg tablet Take 1 Tab by mouth daily.   ??? metFORMIN (GLUCOPHAGE) 500 mg tablet Take 2 Tabs by mouth two (2) times daily (with meals).   ??? glucose blood VI test strips (ASCENSIA AUTODISC VI, ONE TOUCH ULTRA TEST VI) strip Two times daily   ??? lansoprazole (PREVACID) 30 mg capsule Take 30 mg by mouth Daily (before breakfast).   ??? cholecalciferol, vitamin D3, (VITAMIN D3) 2,000 unit Tab Take 2,000 Units by mouth.   ??? b complex vitamins tablet Take 1 Tab by mouth daily.     ??? Lancets (ONE TOUCH DELICA) Misc by Does Not Apply route. As directed.  Indications: One Touch Ultra 2 Lancets (Delica)   ??? ascorbic acid (VITAMIN C) 500 mg tablet Take  by mouth.   ??? vitamin E (AQUA GEMS) 400 unit capsule Take 400 Units by mouth two (2) times a day.   ??? GUAIFENESIN/DEXTROMETHORPHAN (MUCINEX DM PO) Take  by mouth.        Allergies   Allergen Reactions   ??? Aldactone (Spironolactone) Swelling   ??? Codeine Rash and Itching         ROS see HPI    BP 123/79   Pulse 74   Temp 96.9 ??F (36.1 ??C) (Oral)   Ht 5' 5.5" (1.664 m)   Wt 178 lb 9.6 oz (81.012 kg)   BMI 29.27 kg/m2   SpO2 98%       Physical Exam   Vitals reviewed.  Constitutional: Katrina [name] appears well-developed and well-nourished. No distress.   Cardiovascular: Normal rate, regular rhythm, normal heart sounds and intact distal pulses.  Exam reveals no gallop and no friction rub.    No murmur heard.  Pulses:       Radial pulses are 2+ on the right side, and 2+ on the left side.        Dorsalis pedis pulses are 2+ on the right side, and 2+ on the left side.        Posterior tibial pulses are 2+ on the right side, and 2+ on the left side.        No peripheral edema noted   Pulmonary/Chest: Effort normal and breath sounds normal. Katrina [name] has no wheezes. Katrina [name] has no rales.       ASSESSMENT and PLAN    Diabetes mellitus-- Better controlled with BMI now under  29 and will decrease metformin to 500 twice daily.  Input from hepatology at [facility] regarding any benefit to use of metformin when Katrina [name] now longer needs it for DM control is needed.       Essential hypertension, benign--Controlled and room to decrease dose with home reading low and experiencing orthostatic sx.  Down to 5mg  daily from 10    Hypothyroidism--Clinically euthyroid. Labs UTD    Hyperlipidemia--controlled.  Labs due in 3 months.     OBESITY -  Resolved.  Now in overweight category.  Continue weight loss efforts.            Follow-up Disposition:  Return in about 3 months (around 12/21/2011) for DM HTN LIPID.

## 2011-10-01 NOTE — Telephone Encounter (Signed)
Message copied by Julieta Gutting on Mon Oct 01, 2011 11:50 AM  ------       Message from: Luanne Bras       Created: Mon Oct 01, 2011  8:05 AM       Regarding: Non-Urgent Medical Question       Contact: [phone]         Dr Marland Kitchen, I think I may hv another UTI, same symptoms I had when i saw you in July.  Do i need to come in or can Diflucan be called in local pharmacy on file?  symp: urgency, backache, urinary pressure - feel like i need to go but don't.

## 2011-10-01 NOTE — Telephone Encounter (Signed)
Dr. Marland Kitchen, please review below and advise. I have asked the pt if she is available to come in for a visit this week. Please let me know your thoughts in the meantime.    Thanks!

## 2011-10-01 NOTE — Telephone Encounter (Signed)
Replied to pt via My Chart per conversation w/ Dr. Marland Kitchen. Will continue correspondence that way since that's the way the pt started communication.

## 2011-10-02 ENCOUNTER — Encounter

## 2011-10-02 NOTE — Telephone Encounter (Signed)
Per VO from Dr. Marland Kitchen, Bactrim ordered to [facility] pharmacy. Co-sign required. Pt. Notified via My Chart.

## 2011-12-25 LAB — AMB POC HEMOGLOBIN A1C: Hemoglobin A1c (POC): 5.1 % (ref 4.8–5.6)

## 2011-12-25 LAB — AMB POC LIPID PROFILE
Cholesterol (POC): 130
HDL Cholesterol (POC): 52
LDL Cholesterol (POC): 65
Non-HDL Goal (POC): 79
TChol/HDL Ratio (POC): 1.3
Triglycerides (POC): 66

## 2011-12-25 NOTE — Patient Instructions (Addendum)
PRESCRIPTION REFILL POLICY    [facility] Statement to Patients  January 1,2013       In an effort to ensure the large volume of patient prescription refills is processed in the most efficient and expeditious manner, we are asking our patients to assist us by calling your Pharmacy for all prescription refills, this will include also your  Mail Order Pharmacy. The pharmacy will contact our office electronically to continue the refill process.    Please do not wait until the last minute to call your pharmacy. We need at least 48 hours (2days) to fill prescriptions. We also encourage you to call your pharmacy before going to pick up your prescription to make sure it is ready.     With regard to controlled substance prescription refill requests (narcotic refills) that need to be picked up at our office, we ask your cooperation by providing us with at least 72 hours (3days) notice that you will need a refill.    We will not refill narcotic prescription refill requests after 4:00pm on any weekday, Monday through Thursday, or after 2:00pm on Fridays, or on the weekends.      We encourage everyone to explore another way of getting your prescription refill request processed using MyChart, our patient web portal through our electronic medical record system. MyChart is an efficient and effective way to communicate your medication request directly to the office and  downloadable as an app on your smart phone . MyChart also features a review functionality that allows you to view your medication list as well as leave messages for your physician. Are you ready to get connected? If so please review the attatched instructions or speak to any of our staff to get you set up right away!    Thank you so much for your cooperation. Should you have any questions please contact our Practice Administrator.    The Physicians and Staff, [facility]

## 2011-12-25 NOTE — Progress Notes (Signed)
Quick Note:    Office visit review or my chart review  ______

## 2011-12-25 NOTE — Progress Notes (Signed)
Subjective:      Katrina [name] is a 46 y.o. female     The patient presents today for followup on hypertension, diabetes, hypothyroidism and obesity.  She continues to do well following a Weight [employer] Program and exercising.  She has hit a plateau with her weight loss, but is handling it with increasing her exercise.  She is struggling a little bit with having some hunger pangs which she would like some guidance on.  Her next goal, she takes them in 10 pound increments, is to get to 165.  At her last visit, we decreased her metformin because her A1c is within the normal range.  Today she presents for a recheck on A1c and cholesterol and a check on whether her diabetes has in fact resolved itself with her weight loss success.  Otherwise, she is without concerns.  Denies any headache, chest pain or shortness of breath.    [facility]h           Lab Results   Component Value Date/Time    Hemoglobin A1c 6.1 06/27/2011  9:52 AM    Hemoglobin A1c 6.5 12/22/2010 10:44 AM    Hemoglobin A1c 5.8 11/08/2009  9:53 AM    Microalbumin,urine random 2.47 12/19/2007 10:47 AM    Microalbumin/Creat ratio (mg/g creat) 21 12/19/2007 10:47 AM    LDL, calculated 60 06/27/2011  9:52 AM    Creatinine 0.64 03/23/2011 10:05 AM      Lab Results   Component Value Date/Time    Cholesterol, total 118 06/27/2011  9:52 AM    HDL Cholesterol 43 06/27/2011  9:52 AM    LDL, calculated 60 06/27/2011  9:52 AM    Triglyceride 76 06/27/2011  9:52 AM    CHOL/HDL Ratio 3.9 04/15/2007  9:25 AM     Lab Results   Component Value Date/Time    ALT 49 07/17/2010  8:44 AM    AST 40 07/17/2010  8:44 AM    Alk. phosphatase 62 02/11/2009 11:20 AM    Bilirubin, total 0.2 02/11/2009 11:20 AM     Lab Results   Component Value Date/Time    GFR est AA 134 03/27/2010 10:17 AM    GFR est non-AA 110 03/23/2011 10:05 AM    Creatinine 0.64 03/23/2011 10:05 AM    BUN 13 03/23/2011 10:05 AM    Sodium 134 03/23/2011 10:05 AM    Potassium 4.8 03/23/2011 10:05 AM    Chloride 103 03/23/2011 10:05  AM    CO2 19 03/23/2011 10:05 AM      Lab Results   Component Value Date/Time    TSH 0.726 06/27/2011  9:52 AM    T4, Free 1.74 06/27/2011  9:52 AM    T4 8.7 02/11/2009 11:20 AM         Current Outpatient Prescriptions   Medication Sig Dispense Refill   ??? atorvastatin (LIPITOR) 40 mg tablet Take 1 Tab by mouth daily.  90 Tab  1   ??? levothyroxine (LEVOXYL) 88 mcg tablet Take 1 Tab by mouth daily.  90 Tab  3   ??? cholecalciferol, vitamin D3, (VITAMIN D3) 2,000 unit Tab Take 2,000 Units by mouth.       ??? b complex vitamins tablet Take 1 Tab by mouth daily.         ??? ascorbic acid (VITAMIN C) 500 mg tablet Take  by mouth.       ??? vitamin E (AQUA GEMS) 400 unit capsule Take 400 Units by mouth two (  2) times a day.       ??? GUAIFENESIN/DEXTROMETHORPHAN (MUCINEX DM PO) Take  by mouth.       ??? glucose blood VI test strips (ASCENSIA AUTODISC VI, ONE TOUCH ULTRA TEST VI) strip Two times daily  3 Package  3   ??? lansoprazole (PREVACID) 30 mg capsule Take 30 mg by mouth Daily (before breakfast).       ??? Lancets (ONE TOUCH DELICA) Misc by Does Not Apply route. As directed.  Indications: One Touch Ultra 2 Lancets (Delica)  3 Package  3     No current facility-administered medications for this visit.      Allergies   Allergen Reactions   ??? Aldactone (Spironolactone) Swelling   ??? Codeine Rash and Itching     Past Medical History   Diagnosis Date   ??? S/P tonsillectomy 07/25/2007   ??? S/P appendectomy 07/25/2007   ??? Obesity 07/25/2007   ??? Hyperlipidemia 07/25/2007   ??? Fatty liver 07/25/2007   ??? Elevated triglycerides with high cholesterol 07/25/2007   ??? Depression 07/25/2007   ??? GE reflux 07/25/2007   ??? HTN 07/25/2007   ??? Hypothyroidism 07/25/2007   ??? Pap smear for cervical cancer screening 08/02/10   ??? Hx of mammogram 7/12     MRI, 6 mo   ??? Adrenal adenoma 12/12/2009   ??? Chest pain, unspecified 12/12/2009      Past Surgical History   Procedure Laterality Date   ??? Pr appendectomy     ??? Hx tonsillectomy     ??? Hx breast reduction     ??? Hx cholecystectomy      ??? Pr abdomen surgery proc unlisted  08/01/09     laprascopic right adrenalectomy      History     Social History   ??? Marital Status: UNKNOWN     Spouse Name: Single     Number of Children: 0   ??? Years of Education: N/A     Occupational History   ??? [employer]     Social History Main Topics   ??? Smoking status: Former Smoker -- 0.30 packs/day for 10 years     Types: Cigarettes     Quit date: 02/08/2009   ??? Smokeless tobacco: Never Used   ??? Alcohol Use: 0.5 oz/week     1 Glasses of wine per week   ??? Drug Use: No   ??? Sexually Active: Yes     Other Topics Concern   ??? Not on file     Social History Narrative   ??? No narrative on file      Family History   Problem Relation Age of Onset   ??? Hypertension Mother    ??? Breast Cancer Mother    ??? Stroke Mother    ??? Diabetes Father    ??? Hypertension Father    ??? Stroke Sister       BP 133/86   Pulse 72   Temp(Src) 98.2 ??F (36.8 ??C) (Oral)   Ht 5' 5.5" (1.664 m)   Wt 179 lb 9.6 oz (81.466 kg)   BMI 29.42 kg/m2   SpO2 98%     Review of Systems:   Pertinent items are noted in HPI.    Physical Exam:   General appearance: alert, cooperative, no distress, appears stated age  Head: Normocephalic, without obvious abnormality, atraumatic  Eyes: conjunctivae/corneas clear. PERRL, EOM's intact. Fundi benign  Ears: normal TM's and external ear canals AU  Nose: Nares normal.  Septum midline. Mucosa normal. No drainage or sinus tenderness.  Throat: Lips, mucosa, and tongue normal. Teeth and gums normal  Lungs: clear to auscultation bilaterally  Heart: regular rate and rhythm, S1, S2 normal, no murmur, click, rub or gallop    Last Point of Care HGB A1C  Hemoglobin A1c (POC)   Date Value Range Status   12/25/2011 5.1  4.8 - 5.6 % Final      Lab Results   Component Value Date/Time    Cholesterol, total 118 06/27/2011  9:52 AM    Cholesterol (POC) 130 12/25/2011 10:20 AM    HDL Cholesterol (POC) 52 12/25/2011 10:20 AM    HDL Cholesterol 43 06/27/2011  9:52 AM    LDL Cholesterol (POC) 65  16/10/9602 10:20 AM    LDL, calculated 60 06/27/2011  9:52 AM    VLDL, calculated 15 06/27/2011  9:52 AM    Triglyceride 76 06/27/2011  9:52 AM    Triglycerides (POC) 66 12/25/2011 10:20 AM    CHOL/HDL Ratio 3.9 04/15/2007  9:25 AM       Assessment and Plan:   Tiann was seen today for follow-up.    Diagnoses and associated orders for this visit:    Diabetes mellitus  - AMB POC HEMOGLOBIN A1C    Hyperlipidemia  - AMB POC LIPID PROFILE  - Cancel: METABOLIC PANEL, COMPREHENSIVE    Unspecified hypothyroidism  - Cancel: TSH, 3RD GENERATION    Fatty liver    Overweight (BMI 25.0-29.9)    Other Orders  - Cancel: AMB POC URINALYSIS DIP STICK AUTO W/O MICRO  - Cancel: MAM MAMMO BI SCREENING DIGTL; Future      Stop metformin with 5.1 and ACE-I due to cure of DM due to lifestyle changes.  Hold on Lipitor changes for now.  Patient will continue her weigh loss success and goal of 165 is next.  Understands about the plateau and that she will work through it with increased exercise, spreading her meals out to smaller meals 5 times daily to hold off the hunger feeling.  Not discouraged at all.  Smiling and happy with her success - Encouragement and acknowledgement given for her success at curing her DM  Through diet and exercise.  May consider halving dose of lipitor at next visit if lipids fall further.

## 2012-02-20 ENCOUNTER — Encounter

## 2012-03-10 ENCOUNTER — Encounter

## 2012-03-11 ENCOUNTER — Encounter

## 2012-04-23 NOTE — Progress Notes (Signed)
Subjective:   Katrina [name] is a 47 y.o. female with hypothyroid and HLD.  Brings her labs from HLD for review.  Doing well with medications.  Concerns about hitting plateau with weight loss efforts.  Has personal training who has plan to increase her work out intensity to achieve more weight loss.  Tolerating her medications well.  No on meds for DM with diet now controlling her blood sugars.      A1C - 5.5  TSH  1.05  Lipids - to goal - results scanned into her chart    Current Outpatient Prescriptions   Medication Sig Dispense Refill   ??? levothyroxine (LEVOXYL) 88 mcg tablet Take 1 Tab by mouth daily.  90 Tab  3   ??? atorvastatin (LIPITOR) 40 mg tablet Take 1 Tab by mouth daily.  90 Tab  1   ??? glucose blood VI test strips (ASCENSIA AUTODISC VI, ONE TOUCH ULTRA TEST VI) strip Two times daily  3 Package  3   ??? cholecalciferol, vitamin D3, (VITAMIN D3) 2,000 unit Tab Take 2,000 Units by mouth.       ??? b complex vitamins tablet Take 1 Tab by mouth daily.         ??? Lancets (ONE TOUCH DELICA) Misc by Does Not Apply route. As directed.  Indications: One Touch Ultra 2 Lancets (Delica)  3 Package  3   ??? ascorbic acid (VITAMIN C) 500 mg tablet Take  by mouth.       ??? vitamin E (AQUA GEMS) 400 unit capsule Take 400 Units by mouth two (2) times a day.       ??? GUAIFENESIN/DEXTROMETHORPHAN (MUCINEX DM PO) Take  by mouth.       ??? lansoprazole (PREVACID) 30 mg capsule Take 30 mg by mouth Daily (before breakfast).          Home readings are 120s/70-80.    Hypertension ROS: taking medications as instructed, no medication side effects noted, no TIA's, no chest pain on exertion, no dyspnea on exertion, no swelling of ankles.   New concerns: as above.     Objective:   BP 147/91   Pulse 76   Temp(Src) 98 ??F (36.7 ??C) (Oral)   Resp 12   Ht 5' 5.5" (1.664 m)   Wt 185 lb 3.2 oz (84.006 kg)   BMI 30.34 kg/m2   SpO2 96%   LMP 04/22/2012   Appearance alert, well appearing, and in no distress.  General exam BP noted to be well  controlled today in office, S1, S2 normal, no gallop, no murmur, chest clear, no JVD, no HSM, no edema.   Lab review: labs are reviewed, up to date and normal.     Katrina [name] was seen today for follow-up.    Diagnoses and associated orders for this visit:    Hyperlipidemia    Hypothyroidism    Other Orders  - atorvastatin (LIPITOR) 40 mg tablet; Take 1 Tab by mouth daily.        Current diagnosis and concerns discussed.  Above concerns are all stable.  No changes in regimen needed.  Labs ordered as reflected above.  All patient questions were addressed and patient expressed understanding of the current plan.  Pt instructed to call with any concerns or problems. ??Follow-up Disposition:  Return in about 6 months (around 10/23/2012) for Thyroid and Lipids.    Time Based coding.  Greater than 50% of 30 minute office visit spent on counseling and coordination of care.  Discussed her HDL labs, weight loss efforts and exercise.

## 2012-04-23 NOTE — Patient Instructions (Addendum)
PRESCRIPTION REFILL POLICY    [facility] Statement to Patients  January 1,2013       In an effort to ensure the large volume of patient prescription refills is processed in the most efficient and expeditious manner, we are asking our patients to assist [place] by calling your Pharmacy for all prescription refills, this will include also your  Mail Order Pharmacy. The pharmacy will contact our office electronically to continue the refill process.    Please do not wait until the last minute to call your pharmacy. We need at least 48 hours (2days) to fill prescriptions. We also encourage you to call your pharmacy before going to pick up your prescription to make sure it is ready.     With regard to controlled substance prescription refill requests (narcotic refills) that need to be picked up at our office, we ask your cooperation by providing [place] with at least 72 hours (3days) notice that you will need a refill.    We will not refill narcotic prescription refill requests after 4:00pm on any weekday, Monday through Thursday, or after 2:00pm on Fridays, or on the weekends.      We encourage everyone to explore another way of getting your prescription refill request processed using MyChart, our patient web portal through our electronic medical record system. MyChart is an efficient and effective way to communicate your medication request directly to the office and  downloadable as an app on your smart phone . MyChart also features a review functionality that allows you to view your medication list as well as leave messages for your physician. Are you ready to get connected? If so please review the attatched instructions or speak to any of our staff to get you set up right away!    Thank you so much for your cooperation. Should you have any questions please contact our Research officer, political party.    The Physicians and Staff, [facility]          Learning About Vitamin D  Why is it important to get enough  vitamin D?  Your body needs vitamin D to absorb calcium. Calcium keeps your bones and muscles, including your heart, healthy and strong. If your muscles don't get enough calcium, they can cramp, hurt, or feel weak. You may have long-term (chronic) muscle aches and pains.  If you don't get enough vitamin D throughout life, you have an increased chance of having thin and brittle bones (osteoporosis) in your later years. Children who don't get enough vitamin D may not grow as much as others their age. They also have a chance of getting a rare disease called rickets. It causes weak bones.  Vitamin D and calcium are added to many foods. And your body uses sunshine to make its own vitamin D.  How much vitamin D do you need?  The Institute of Medicine recommends that people ages 1 through 91 get 600 IU (international units) every day. Adults 71 and older need 800 IU every day.  Blood tests for vitamin D can check your vitamin D level. But there is no standard normal range used by all laboratories. The Institute of Medicine recommends a blood level of 20 ng/mL of vitamin D for healthy bones. And most people in the [place] and [place] meet this goal.  How can you get more vitamin D?  Foods that contain vitamin D include:  ?? Salmon, tuna, and mackerel. These are some of the best foods to eat when you  need to get more vitamin D.  ?? Cheese, egg yolks, and beef liver. These foods have vitamin D in small amounts.  ?? Milk, soy drinks, orange juice, yogurt, margarine, and some kinds of cereal have vitamin D added to them.  Some people don't make vitamin D as well as others. They may have to take extra care in getting enough vitamin D.  Things that reduce how much vitamin D your body makes include:  ?? Dark skin, such as many African Americans have.  ?? Age, especially if you are older than 40.  ?? Digestive problems, such as Crohn's or celiac disease.  ?? Liver and kidney disease.  Some people who do not get enough vitamin D may  need supplements.  Are there any risks from taking vitamin D?  ?? Too much vitamin D:  ?? Can damage your kidneys.  ?? Can cause nausea and vomiting, constipation, and weakness.  ?? Raises the amount of calcium in your blood. If this happens, you can get confused or have an irregular heart rhythm.  ?? Vitamin D may interact with other medicines. Tell your doctor about all of the medicines you take, including over-the-counter drugs, herbs, and pills. Tell your doctor about all of your current medical problems.   Where can you learn more?   Go to [email or web address]  Enter V530 in the search box to learn more about "Learning About Vitamin D."   ?? 2006-2014 Healthwise, Incorporated. Care instructions adapted under license by [employer] (which disclaims liability or warranty for this information). This care instruction is for use with your licensed healthcare professional. If you have questions about a medical condition or this instruction, always ask your healthcare professional. Healthwise, Incorporated disclaims any warranty or liability for your use of this information.  Content Version: 10.0.270728; Last Revised: February 09, 2011

## 2012-06-06 NOTE — Telephone Encounter (Signed)
-----   Message -----  From: Armendariz,Yuliet M  This message was pulled from the pool.  The patient was seen in the office on 04/23/12.  Please advise.    Sent: 06/05/2012 3:08 PM EDT  To: Chase Picket, MD  Subject: Non-Urgent Medical Question    my sister was admitted to [facility] yesterday (5/28) afternoon with "working diagnosis" of bacterial meningitis; high WBC's & neutrophils in the CSF; awaiting CSF culture results; negative thus far (24 hrs at 3 pm); since I was with my sister all weekend and all day yesterday, wanted to touch base and see if you wanted to treat me prophylactially or have the rx at local pharmacy [facility]) in case the cultures turn positive. Infectious Disease Dr mentioned Cipro.    if you need to contact me, my cell# is [phone].    thanks.

## 2012-06-16 NOTE — Telephone Encounter (Signed)
Message copied by Jackolyn Confer on Mon Jun 16, 2012  9:34 AM  ------       Message from: Rutha Bouchard       Created: Fri Jun 06, 2012  8:52 AM       Regarding: RE: Non-Urgent Medical Question                 ----- Message from Gertha Calkin to Chase Picket, MD sent at 06/06/2012  8:51 AM -----        Thanks.        Sent from my iPhone              ----- Message -----       From: Berkley Harvey. Rubye Oaks       Sent: 06/06/12 at 8:47 AM       To: Gertha Calkin       Subject: RE: Non-Urgent Medical Question              Your massage has been forwarded to Dr. Berniece Andreas                     ----- Message -----          From: Fehring,Deanne M          Sent: 06/05/2012  3:08 PM EDT            To: MONICA Valrie Hart, MD       Subject: Non-Urgent Medical Question              my sister was admitted to [facility] yesterday (5/28) afternoon with "working diagnosis" of bacterial meningitis; high WBC's & neutrophils in the CSF; awaiting CSF culture results; negative thus far (24 hrs at 3 pm); since I was with my sister all weekend and all day yesterday, wanted to touch base and see if you wanted to treat me prophylactially or have the rx at local pharmacy [facility]) in case the cultures turn positive. Infectious Disease Dr mentioned Cipro.              if you need to contact me, my cell# is [phone].              thanks.  ------

## 2012-06-20 NOTE — Telephone Encounter (Signed)
Called and checked on the patient.  She advised me that her sister has been Dc'd from the hospital last week.  She is still on IV antibiotics. The patient did pick up there generic of Cipro from the pharmacy.  She has not had any symptoms.

## 2012-11-03 NOTE — Patient Instructions (Signed)
Hyperlipidemia: After Your Visit  Your Care Instructions  Hyperlipidemia is too much fat in your blood. The body has several kinds of fat, including cholesterol and triglycerides. Your body needs fat for many things, such as making new cells. But too much fat in your blood increases your chances of having a heart attack or stroke.  You may be able to lower your cholesterol and triglycerides with a heart-healthy diet, exercise, and if needed, medicine. Your doctor may want you to try lifestyle changes first to see whether they lower the fat in your blood. You may need to take medicine if lifestyle changes do not lower the fat in your blood enough.  Follow-up care is a key part of your treatment and safety. Be sure to make and go to all appointments, and call your doctor if you are having problems. It???s also a good idea to know your test results and keep a list of the medicines you take.  How can you care for yourself at home?  Take your medicines  ?? Take your medicines exactly as prescribed. Call your doctor if you think you are having a problem with your medicine.  ?? If you take medicine to lower your cholesterol, go to follow-up visits. You will need to have blood tests.  ?? Do not take large doses of niacin, which is a B vitamin, while taking medicine called statins. It may increase the chance of muscle pain and liver problems.  ?? Talk to your doctor about avoiding grapefruit juice if you are taking statins. Grapefruit juice can raise the level of this medicine in your blood. This could increase side effects.  Eat more fruits, vegetables, and fiber  ?? Fruits and vegetables have lots of nutrients that help protect against heart disease, and they have little???if any???fat. Try to eat at least five servings a day. Dark green, deep orange, or yellow fruits and vegetables are healthy choices.  ?? Keep carrots, celery, and other veggies handy for snacks. Buy fruit that is in season and store it where you can see it so that  you will be tempted to eat it. Cook dishes that have a lot of veggies in them, such as stir-fries and soups.  ?? Foods high in fiber may reduce your cholesterol and provide important vitamins and minerals. High-fiber foods include whole-grain cereals and breads, oatmeal, beans, brown rice, citrus fruits, and apples.  ?? Buy whole-grain breads and cereals instead of white bread and pastries.  Limit saturated fat  ?? Read food labels and try to avoid saturated fat and trans fat. They increase your risk of heart disease.  ?? Use olive or canola oil when you cook. Try cholesterol-lowering spreads, such as Benecol or Take Control.  ?? Bake, broil, grill, or steam foods instead of frying them.  ?? Limit the amount of high-fat meats you eat, including hot dogs and sausages. Cut out all visible fat when you prepare meat.  ?? Eat fish, skinless poultry, and soy products such as tofu instead of high-fat meats. Soybeans may be especially good for your heart. Eat at least two servings of fish a week. Certain fish, such as salmon, contain omega-3 fatty acids, which may help reduce your risk of heart attack.  ?? Choose low-fat or fat-free milk and dairy products.  Get exercise, limit alcohol, and quit smoking  ?? Get more exercise. Work with your doctor to set up an exercise program. Even if you can do only a small amount, exercise will help   you get stronger, have more energy, and manage your weight and your stress. Walking is an easy way to get exercise. Gradually increase the amount you walk every day. Aim for at least 30 minutes on most days of the week. You also may want to swim, bike, or do other activities.  ?? Limit alcohol to no more than 2 drinks a day for men and 1 drink a day for women.  ?? Do not smoke. If you need help quitting, talk to your doctor about stop-smoking programs and medicines. These can increase your chances of quitting for good.  When should you call for help?  Call 911 anytime you think you may need emergency  care. For example, call if:  ?? You have symptoms of a heart attack. These may include:  ?? Chest pain or pressure, or a strange feeling in the chest.  ?? Sweating.  ?? Shortness of breath.  ?? Nausea or vomiting.  ?? Pain, pressure, or a strange feeling in the back, neck, jaw, or upper belly or in one or both shoulders or arms.  ?? Lightheadedness or sudden weakness.  ?? A fast or irregular heartbeat.  After you call 911, the operator may tell you to chew 1 adult-strength or 2 to 4 low-dose aspirin. Wait for an ambulance. Do not try to drive yourself.  ?? You have signs of a stroke. These may include:  ?? Sudden numbness, paralysis, or weakness in your face, arm, or leg, especially on only one side of your body.  ?? New problems with walking or balance.  ?? Sudden vision changes.  ?? Drooling or slurred speech.  ?? New problems speaking or understanding simple statements, or feeling confused.  ?? A sudden, severe headache that is different from past headaches.  ?? You passed out (lost consciousness).  Call your doctor now or seek immediate medical care if:  ?? You have muscle pain or weakness.  Watch closely for changes in your health, and be sure to contact your doctor if:  ?? You are very tired.  ?? You have an upset stomach, gas, constipation, or belly pain or cramps.   Where can you learn more?   Go to http://www.healthwise.net/[facility]  Enter C406 in the search box to learn more about "Hyperlipidemia: After Your Visit."   ?? 2006-2013 Healthwise, Incorporated. Care instructions adapted under license by [employer] (which disclaims liability or warranty for this information). This care instruction is for use with your licensed healthcare professional. If you have questions about a medical condition or this instruction, always ask your healthcare professional. Healthwise, Incorporated disclaims any warranty or liability for your use of this information.  Content Version: 9.9.209917; Last Revised: October 20, 2009

## 2012-11-03 NOTE — Progress Notes (Signed)
Chief Complaint   Patient presents with   ??? New Patient   Pt had been seeing Dr. Marland Kitchen but physician stop seeing pt. Pt saw Gastro MD last week and had labs done. Pt is fasting today.

## 2012-11-03 NOTE — Progress Notes (Signed)
HISTORY OF PRESENT ILLNESS  Katrina [name] is a 47 y.o. female presents with New Patient    Katrina [name] is here today to establish care. She was previously a patient of Dr. Emelia [name], but she is no longer practicing in the area.   She and Dr. Marland Kitchen were working together to get her weight down, so that she can stop her diabetes, and BP medications. She has been off of these since December of 2013.   She continues to work on diet and exercise, by doing the [employer] plan, and training for a Half Marathon.   She is established with GYN for her well woman screening, and with GI, and is enrolled in a NASH study at [facility].     She d any major complaints today, and denies any fevers, chills, unintentional weight loss, night sweats, chest pain, shortness of breath, chest pain/shortness of breath with exertion, abdominal pain, nausea, vomiting, diarrhea, constipation, blood in the stool, dysuria, new headaches, visual changes, or weakness, or syncopal events.       Agree with nurse note.      ROS    Review of Systems negative except as noted above in HPI.    ALLERGIES:    Allergies   Allergen Reactions   ??? Aldactone [Spironolactone] Swelling   ??? Codeine Rash and Itching     Current Outpatient Prescriptions   Medication Sig Dispense Refill   ??? atorvastatin (LIPITOR) 40 mg tablet Take 1 Tab by mouth daily.  90 Tab  1   ??? levothyroxine (LEVOXYL) 88 mcg tablet Take 1 Tab by mouth daily.  90 Tab  3   ??? GUAIFENESIN/DEXTROMETHORPHAN (MUCINEX DM PO) Take  by mouth.       ??? cholecalciferol, vitamin D3, (VITAMIN D3) 2,000 unit Tab Take 2,000 Units by mouth.       ??? b complex vitamins tablet Take 1 Tab by mouth daily.         ??? ascorbic acid (VITAMIN C) 500 mg tablet Take  by mouth.       ??? vitamin E (AQUA GEMS) 400 unit capsule Take 400 Units by mouth two (2) times a day.           PAST MEDICAL HISTORY:    Past Medical History   Diagnosis Date   ??? S/P tonsillectomy 07/25/2007   ??? S/P appendectomy 07/25/2007   ??? Obesity  07/25/2007   ??? Hyperlipidemia 07/25/2007   ??? Fatty liver 07/25/2007     In NASH study at [facility] (Katrina [name])   ??? Elevated triglycerides with high cholesterol 07/25/2007   ??? Depression 07/25/2007   ??? GE reflux 07/25/2007     Has improved with weight loss (has GI at [facility])   ??? HTN 07/25/2007   ??? Hypothyroidism 07/25/2007   ??? Pap smear for cervical cancer screening 08/02/10     Katrina [name])   ??? Hx of mammogram 7/12     MRI, 6 mo   ??? Adrenal adenoma 12/12/2009     Saw Nephrologist for this    ??? Chest pain, unspecified 12/12/2009       PAST SURGICAL HISTORY:    Past Surgical History   Procedure Laterality Date   ??? Pr appendectomy     ??? Hx tonsillectomy     ??? Hx breast reduction     ??? Hx cholecystectomy     ??? Pr abdomen surgery proc unlisted  08/01/09  laprascopic right adrenalectomy       FAMILY HISTORY:    Family History   Problem Relation Age of Onset   ??? Hypertension Mother    ??? Breast Cancer Mother 63   ??? Stroke Mother    ??? Diabetes Father    ??? Hypertension Father    ??? Stroke Sister 92     Carotid Artery Dissection       SOCIAL HISTORY:    History     Social History   ??? Marital Status: UNKNOWN     Spouse Name: Single     Number of Children: 0   ??? Years of Education: N/A     Occupational History   ??? [employer]     Social History Main Topics   ??? Smoking status: Former Smoker -- 0.30 packs/day for 10 years     Types: Cigarettes     Quit date: 02/08/2009   ??? Smokeless tobacco: Never Used   ??? Alcohol Use: 0.5 oz/week     1 Glasses of wine per week   ??? Drug Use: No   ??? Sexually Active: Yes     Other Topics Concern   ??? Not on file     Social History Narrative   ??? No narrative on file       IMMUNIZATIONS:    Immunization History   Administered Date(s) Administered   ??? Influenza Vaccine Split 10/24/2010   ??? Pneumococcal Polysaccharide Vaccine, 23 Valent 12/22/2010   ??? Pneumococcal Vaccine 12/05/2005   ??? TD Vaccine 10/23/2000   ??? TDAP Vaccine 10/24/2010         PHYSICAL EXAMINATION    Vital  Signs  BP 127/92   Pulse 75   Temp(Src) 99.2 ??F (37.3 ??C) (Oral)   Resp 16   Ht 5\' 5"  (1.651 m)   Wt 180 lb 3.2 oz (81.738 kg)   BMI 29.99 kg/m2   SpO2 98%   LMP 10/14/2012    General appearance - Well nourished. Well appearing.  Well developed.  No acute distress.   Head - Normocephalic.  Atraumatic.    Eyes - Extraocular eye movements intact. PERRL  Ears - Hearing is grossly normal bilaterally.      Nose - normal and patent. No discharge noted.    Mouth - mucous membranes with adequate moisture.  Posterior pharynx appears normal with no erythema, white exudate or obstruction.  Neck - supple.  Midline trachea. No thyromegaly noted.  Chest - clear to auscultation bilaterally anterriorly and posteriorly.  No wheezes.  No rales or rhonchi.  Breath sounds are symmetrical bilaterally.  Unlabored respirations.  Heart - normal rate.  Regular rhythm.  Normal S1, S2.  No murmur noted.  No rubs, clicks or gallops noted.  Abdomen - soft and nondistended.  No masses or organomegaly.  No rebound, rigidity or guarding.  Bowel sounds normal x 4 quadrants.  No tenderness noted.  Neurological - awake, alert. Cranial nerves II through XII grossly intact.  Clear speech.  Heme/Lymph - peripheral pulses normal x 4 extremities. No peripheral edema is noted.    Skin - no rashes, erythema, ecchymosis, noted, warm to touch  Psychological -   normal behavior, dress and thought processes.  Good insight. Good eye contact.  Normal affect.  Appropriate mood.  Normal speech.    DATA REVIEWED    Results for orders placed in visit on 12/25/11   AMB POC HEMOGLOBIN A1C       Result Value  Range    Hemoglobin A1c (POC) 5.1  4.8 - 5.6 %   AMB POC LIPID PROFILE       Result Value Range    Cholesterol (POC) 130      Triglycerides (POC) 66      HDL Cholesterol (POC) 52      LDL Cholesterol (POC) 65      Non-HDL Goal (POC) 79      TChol/HDL Ratio (POC) 1.3       ASSESSMENT and PLAN    Katrina [name] was seen today for new patient.    Diagnoses and associated  orders for this visit:    Overweight (BMI 25.0-29.9)    Hyperlipidemia    Hypothyroidism    GE reflux    Fatty liver        Ms. Shands is overall doing well, and denies any major complaints.   She is up to date with her screening and preventative Health care.   She is established with GYN for her pap smears and mammograms, and with GI to follow her fatty liver disease, and NASH.   She just recently had HDL lab work performed, and is still waiting on those results. I asked her to sign a release so that we may obtain those from her GI physician.     She goes to a dentist every 3 months, and has her screening eye exam yearly.   I congratulated her on her weight loss journey. She is very motivated to continue working, and would like to eventually try to get off the cholesterol medication as well. Will review HDL labs to see if this is a possibility.  Will follow up in 6 months, or sooner if needed.     Patient expresses understanding of treatment plan and agrees with recommendations.          Patient Instructions       Hyperlipidemia: After Your Visit  Your Care Instructions  Hyperlipidemia is too much fat in your blood. The body has several kinds of fat, including cholesterol and triglycerides. Your body needs fat for many things, such as making new cells. But too much fat in your blood increases your chances of having a heart attack or stroke.  You may be able to lower your cholesterol and triglycerides with a heart-healthy diet, exercise, and if needed, medicine. Your doctor may want you to try lifestyle changes first to see whether they lower the fat in your blood. You may need to take medicine if lifestyle changes do not lower the fat in your blood enough.  Follow-up care is a key part of your treatment and safety. Be sure to make and go to all appointments, and call your doctor if you are having problems. It???s also a good idea to know your test results and keep a list of the medicines you take.  How can you care  for yourself at home?  Take your medicines  ?? Take your medicines exactly as prescribed. Call your doctor if you think you are having a problem with your medicine.  ?? If you take medicine to lower your cholesterol, go to follow-up visits. You will need to have blood tests.  ?? Do not take large doses of niacin, which is a B vitamin, while taking medicine called statins. It may increase the chance of muscle pain and liver problems.  ?? Talk to your doctor about avoiding grapefruit juice if you are taking statins. Grapefruit juice can raise the level of this medicine  in your blood. This could increase side effects.  Eat more fruits, vegetables, and fiber  ?? Fruits and vegetables have lots of nutrients that help protect against heart disease, and they have little???if any???fat. Try to eat at least five servings a day. Dark green, deep orange, or yellow fruits and vegetables are healthy choices.  ?? Keep carrots, celery, and other veggies handy for snacks. Buy fruit that is in season and store it where you can see it so that you will be tempted to eat it. Cook dishes that have a lot of veggies in them, such as stir-fries and soups.  ?? Foods high in fiber may reduce your cholesterol and provide important vitamins and minerals. High-fiber foods include whole-grain cereals and breads, oatmeal, beans, brown rice, citrus fruits, and apples.  ?? Buy whole-grain breads and cereals instead of white bread and pastries.  Limit saturated fat  ?? Read food labels and try to avoid saturated fat and trans fat. They increase your risk of heart disease.  ?? Use olive or canola oil when you cook. Try cholesterol-lowering spreads, such as Benecol or Take Control.  ?? Bake, broil, grill, or steam foods instead of frying them.  ?? Limit the amount of high-fat meats you eat, including hot dogs and sausages. Cut out all visible fat when you prepare meat.  ?? Eat fish, skinless poultry, and soy products such as tofu instead of high-fat meats. Soybeans  may be especially good for your heart. Eat at least two servings of fish a week. Certain fish, such as salmon, contain omega-3 fatty acids, which may help reduce your risk of heart attack.  ?? Choose low-fat or fat-free milk and dairy products.  Get exercise, limit alcohol, and quit smoking  ?? Get more exercise. Work with your doctor to set up an exercise program. Even if you can do only a small amount, exercise will help you get stronger, have more energy, and manage your weight and your stress. Walking is an easy way to get exercise. Gradually increase the amount you walk every day. Aim for at least 30 minutes on most days of the week. You also may want to swim, bike, or do other activities.  ?? Limit alcohol to no more than 2 drinks a day for men and 1 drink a day for [name].  ?? Do not smoke. If you need help quitting, talk to your doctor about stop-smoking programs and medicines. These can increase your chances of quitting for good.  When should you call for help?  Call 911 anytime you think you may need emergency care. For example, call if:  ?? You have symptoms of a heart attack. These may include:  ?? Chest pain or pressure, or a strange feeling in the chest.  ?? Sweating.  ?? Shortness of breath.  ?? Nausea or vomiting.  ?? Pain, pressure, or a strange feeling in the back, neck, jaw, or upper belly or in one or both shoulders or arms.  ?? Lightheadedness or sudden weakness.  ?? A fast or irregular heartbeat.  After you call 911, the operator may tell you to chew 1 adult-strength or 2 to 4 low-dose aspirin. Wait for an ambulance. Do not try to drive yourself.  ?? You have signs of a stroke. These may include:  ?? Sudden numbness, paralysis, or weakness in your face, arm, or leg, especially on only one side of your body.  ?? New problems with walking or balance.  ?? Sudden vision changes.  ??  Drooling or slurred speech.  ?? New problems speaking or understanding simple statements, or feeling confused.  ?? A sudden, severe  headache that is different from past headaches.  ?? You passed out (lost consciousness).  Call your doctor now or seek immediate medical care if:  ?? You have muscle pain or weakness.  Watch closely for changes in your health, and be sure to contact your doctor if:  ?? You are very tired.  ?? You have an upset stomach, gas, constipation, or belly pain or cramps.   Where can you learn more?   Go to [employer]  Enter C406 in the search box to learn more about "Hyperlipidemia: After Your Visit."   ?? 2006-2013 [employer]. Care instructions adapted under license by [employer] (which disclaims liability or warranty for this information). This care instruction is for use with your licensed healthcare professional. If you have questions about a medical condition or this instruction, always ask your healthcare professional. [employer] disclaims any warranty or liability for your use of this information.  Content Version: 9.9.209917; Last Revised: October 20, 2009                    Odie Sera, DO

## 2012-11-26 NOTE — Telephone Encounter (Signed)
Last office visit 11/03/12.

## 2012-12-09 NOTE — Progress Notes (Signed)
Chief Complaint   Patient presents with   ??? Monoclonal Gammopathy Of Unknown Significance      sister and both of her kids had it, has been feeling fatigue the past few weeks.   ??? Hip Pain     started 11-22-2012 after running marathon, hurts while sitting for a long period of time       1. Have you been to the ER, urgent care clinic since your last visit?  Hospitalized since your last visit? No    2. Have you seen or consulted any other health care providers outside of the [facility] since your last visit?  Include any pap smears or colon screening. NO

## 2012-12-09 NOTE — Patient Instructions (Signed)
Learning About Relief for Back Pain  What is back tension and strain?     Back strain happens when you overstretch, or pull, a muscle in your back. You may hurt your back in an accident or when you exercise or lift something.  Most back pain will get better with rest and time. You can take care of yourself at home to help your back heal.  What can you do first to relieve back pain?  When you first feel back pain, try these steps:  ?? Walk. Take a short walk (10 to 20 minutes) on a level surface (no slopes, hills, or stairs) every 2 to 3 hours. Walk only distances you can manage without pain, especially leg pain.  ?? Relax. Find a comfortable position for rest. Some people are comfortable on the floor or a medium-firm bed with a small pillow under their head and another under their knees. Some people prefer to lie on their side with a pillow between their knees. Don't stay in one position for too long.  ?? Try heat or ice. Try using a heating pad on a low or medium setting, or take a warm shower, for 15 to 20 minutes every 2 to 3 hours. Or you can buy single-use heat wraps that last up to 8 hours. You can also try an ice pack for 10 to 15 minutes every 2 to 3 hours. You can use an ice pack or a bag of frozen vegetables wrapped in a thin towel. There is not strong evidence that either heat or ice will help, but you can try them to see if they help. You may also want to try switching between heat and cold.  ?? Take pain medicine exactly as directed.  ?? If the doctor gave you a prescription medicine for pain, take it as prescribed.  ?? If you are not taking a prescription pain medicine, ask your doctor if you can take an over-the-counter medicine.  What else can you do?  ?? Stretch and exercise. Exercises that increase flexibility may relieve your pain and make it easier for your muscles to keep your spine in a good, neutral position. And don't forget to keep walking.  ?? Do self-massage. You can use self-massage to unwind  after work or school or to energize yourself in the morning. You can easily massage your feet, hands, or neck. Self-massage works best if you are in comfortable clothes and are sitting or lying in a comfortable position. Use oil or lotion to massage bare skin.  ?? Reduce stress. Back pain can lead to a vicious circle: Distress about the pain tenses the muscles in your back, which in turn causes more pain. Learn how to relax your mind and your muscles to lower your stress.   Where can you learn more?   Go to [email or web address]  Enter Q517 in the search box to learn more about "Learning About Relief for Back Pain."   ?? 2006-2014 [employer]. Care instructions adapted under license by [employer] (which disclaims liability or warranty for this information). This care instruction is for use with your licensed healthcare professional. If you have questions about a medical condition or this instruction, always ask your healthcare professional. [employer], Inc[employer] disclaims any warranty or liability for your use of this information.  Content Version: 10.2.346038; Current as of: June 11, 2012              Low Back Pain: Exercises  Your Care Instructions  Here are  some examples of typical rehabilitation exercises for your condition. Start each exercise slowly. Ease off the exercise if you start to have pain.  Your doctor or physical therapist will tell you when you can start these exercises and which ones will work best for you.  How to do the exercises  Press-up    1. Lie on your stomach, supporting your body with your forearms.  2. Press your elbows down into the floor to raise your upper back. As you do this, relax your stomach muscles and allow your back to arch without using your back muscles. As your press up, do not let your hips or pelvis come off the floor.  3. Hold for 15 to 30 seconds, then relax.  4. Repeat 2 to 4 times.  Alternate arm and leg (bird dog) exercise    Note: Do this  exercise slowly. Try to keep your body straight at all times, and do not let one hip drop lower than the other.  1. Start on the floor, on your hands and knees.  2. Tighten your belly muscles.  3. Raise one leg off the floor, and hold it straight out behind you. Be careful not to let your hip drop down, because that will twist your trunk.  4. Hold for about 6 seconds, then lower your leg and switch to the other leg.  5. Repeat 8 to 12 times on each leg.  6. Over time, work up to holding for 10 to 30 seconds each time.  7. If you feel stable and secure with your leg raised, try raising the opposite arm straight out in front of you at the same time.  Knee-to-chest exercise    1. Lie on your back with your knees bent and your feet flat on the floor.  2. Bring one knee to your chest, keeping the other foot flat on the floor (or keeping the other leg straight, whichever feels better on your lower back).  3. Keep your lower back pressed to the floor. Hold for at least 15 to 30 seconds.  4. Relax, and lower the knee to the starting position.  5. Repeat with the other leg. Repeat 2 to 4 times with each leg.  6. To get more stretch, put your other leg flat on the floor while pulling your knee to your chest.  Curl-ups    1. Lie on the floor on your back with your knees bent at a 90-degree angle. Your feet should be flat on the floor, about 12 inches from your buttocks.  2. Cross your arms over your chest.  3. Slowly tighten your belly muscles and raise your shoulder blades off the floor.  4. Keep your head in line with your body, and do not press your chin to your chest.  5. Hold this position for 1 or 2 seconds, then slowly lower yourself back down to the floor.  6. Repeat 8 to 12 times.  Pelvic tilt exercise    1. Lie on your back with your knees bent.  2. "Brace" your stomach. This means to tighten your muscles by pulling in and imagining your belly button moving toward your spine. You should feel like your back is  pressing to the floor and your hips and pelvis are rocking back.  3. Hold for about 6 seconds while you breathe smoothly.  4. Repeat 8 to 12 times.  Heel dig bridging    1. Lie on your back with both knees bent and your  ankles bent so that only your heels are digging into the floor. Your knees should be bent about 90 degrees.  2. Then push your heels into the floor, squeeze your buttocks, and lift your hips off the floor until your shoulders, hips, and knees are all in a straight line.  3. Hold for about 6 seconds as you continue to breathe normally, and then slowly lower your hips back down to the floor and rest for up to 10 seconds.  4. Do 8 to 12 repetitions.  Hamstring stretch in doorway    1. Lie on your back in a doorway, with one leg through the open door.  2. Slide your leg up the wall to straighten your knee. You should feel a gentle stretch down the back of your leg.  3. Hold the stretch for at least 15 to 30 seconds. Do not arch your back, point your toes, or bend either knee. Keep one heel touching the floor and the other heel touching the wall.  4. Repeat with your other leg.  5. Do 2 to 4 times for each leg.  Hip flexor stretch    1. Kneel on the floor with one knee bent and one leg behind you. Place your forward knee over your foot. Keep your other knee touching the floor.  2. Slowly push your hips forward until you feel a stretch in the upper thigh of your rear leg.  3. Hold the stretch for at least 15 to 30 seconds. Repeat with your other leg.  4. Do 2 to 4 times on each side.  Wall sit    1. Stand with your back 10 to 12 inches away from a wall.  2. Lean into the wall until your back is flat against it.  3. Slowly slide down until your knees are slightly bent, pressing your lower back into the wall.  4. Hold for about 6 seconds, then slide back up the wall.  5. Repeat 8 to 12 times.  Follow-up care is a key part of your treatment and safety. Be sure to make and go to all appointments, and call your  doctor if you are having problems. It's also a good idea to know your test results and keep a list of the medicines you take.   Where can you learn more?   Go to [facility]  Enter [phone] in the search box to learn more about "Low Back Pain: Exercises."   ?? 2006-2014 [employer], [employer]. Care instructions adapted under license by [employer] (which disclaims liability or warranty for this information). This care instruction is for use with your licensed healthcare professional. If you have questions about a medical condition or this instruction, always ask your healthcare professional. [employer], [employer] disclaims any warranty or liability for your use of this information.  Content Version: 10.2.346038; Current as of: June 11, 2012              Sciatica: After Your Visit  Your Care Instructions  Sciatica (say "sye-AT-ih-kuh") is an irritation of one of the sciatic nerves, which come from the spinal cord in the lower back. The sciatic nerves and their branches extend down through the buttock to the foot. Sciatica can develop when an injured disc in the back presses against a spinal nerve root. Its main symptom is pain, numbness, or weakness that is often worse in the leg or foot than in the back.  Sciatica often will improve and go away with time. Early treatment usually includes  medicines and exercises to relieve pain.  Follow-up care is a key part of your treatment and safety. Be sure to make and go to all appointments, and call your doctor if you are having problems. It???s also a good idea to know your test results and keep a list of the medicines you take.  How can you care for yourself at home?  ?? Take pain medicines exactly as directed.  ?? If the doctor gave you a prescription medicine for pain, take it as prescribed.  ?? If you are not taking a prescription pain medicine, ask your doctor if you can take an over-the-counter medicine.  ?? Put ice or a cold pack on the middle of your  lower back for 10 to 20 minutes several times each day. Put a thin cloth between the ice and your skin.  ?? After the first 2 or 3 days, use a warm pack or heating pad for 20 minutes at a time. Hot showers will also help. Hot baths may help as long as you can lie or sit in a position that does not stress your back. You may also keep using ice if it helps.  ?? Avoid sitting if possible, unless it feels better than standing.  ?? Alternate lying down with short walks. Increase your walking distance as you are able to without making your symptoms worse.  ?? Do not do anything that makes your symptoms worse.  When should you call for help?  Call 911 anytime you think you may need emergency care. For example, call if:  ?? You have sudden weakness or numbness in both legs.  ?? You lose bowel or bladder control.  ?? You suddenly cannot walk or stand.  Call your doctor now or seek immediate medical care if:  ?? You have weakness in your ankle or leg.  ?? You have new pain, numbness, tingling, or weakness, especially in the buttocks, genital or rectal area, legs, or feet.  ?? You have symptoms of a urinary infection. For example:  ?? You have blood or pus in your urine.  ?? You have pain in your back just below your rib cage. This is called flank pain.  ?? You have a fever, chills, or body aches.  ?? It hurts to urinate.  ?? You have groin or belly pain.  Watch closely for changes in your health, and be sure to contact your doctor if:  ?? Your back pain gets worse or more frequent.  ?? Your back pain is not getting better after 1 week of home treatment. It may take a lot longer for the pain to go away completely, but it should feel at least a little better.   Where can you learn more?   Go to [email or web address]  Enter Z239 in the search box to learn more about "Sciatica: After Your Visit."   ?? 2006-2014 [employer], [employer]. Care instructions adapted under license by [employer] (which disclaims liability or warranty for  this information). This care instruction is for use with your licensed healthcare professional. If you have questions about a medical condition or this instruction, always ask your healthcare professional. [employer] disclaims any warranty or liability for your use of this information.  Content Version: 10.2.346038; Current as of: June 11, 2012              Piriformis Syndrome: After Your Visit  Your Care Instructions     The piriformis muscle is deep under your rear end (buttock). One  end of the muscle connects deep inside the pelvic area, and the other end attaches to the top of the thighbone. This muscle can press on the sciatic nerve that runs from your spine down your leg. When this happens, you may have pain, numbness, and tingling in the buttock and down the back of your leg. This is called piriformis syndrome. The pain may get worse when you sit for a long time or climb stairs. Also, you may be more likely to develop piriformis syndrome if you run or walk often.  Your doctor will check for other causes of your pain before treating this syndrome. Treatment may include stretching exercises, massage, and medicine for the pain and swelling. If these do not help, you may get a shot of steroid medicine. Until the pain is gone, you may need to rest the muscle and limit activities like running. Exercises and a change in how you move and sit may be enough to stop the pressure on the nerve.  Follow-up care is a key part of your treatment and safety. Be sure to make and go to all appointments, and call your doctor if you are having problems. It's also a good idea to know your test results and keep a list of the medicines you take.  How can you care for yourself at home?  ?? If your doctor thinks that strenuous exercise is causing your problem, stop or cut back on activities such as running. You may find swimming to be a good exercise for a while.  ?? Stretch the piriformis muscle.  ?? Lie on your back.  ??  Bend one leg at the knee and keep the other leg flat on the ground.  ?? Raise your bent knee up and then move it across your body. Hold the outside of the knee with the opposite hand.  ?? Gently pull the knee with your hand toward the opposite shoulder.  ?? Hold the stretch for at least 15 to 30 seconds. Switch legs.  ?? Do the stretch several times each day.  ?? Massage the muscle to relieve pressure.  ?? Sit on the floor. Lean to one side so that the hip on your sore side is off the ground. Put a tennis ball under your buttock on that side.  ?? As you put weight onto the tennis ball, you may find spots that are especially sore. Move gently so that the tennis ball gently massages each of the sore spots.  ?? Use ice or heat to help reduce pain. Put ice or a cold pack or a heating pad set on low or a warm cloth on the sore area for 10 to 20 minutes at a time. Put a thin cloth between the ice pack or heating pad and your skin.  ?? Take anti-inflammatory medicines to reduce pain and swelling. These include ibuprofen (Advil, Motrin) and naproxen (Aleve). Read and follow all instructions on the label.  ?? Have your doctor or a physical therapist watch how you move. You may need physical therapy or special inserts in your shoes (orthotics) to help you move in a way that does not put pressure on your nerves.  When should you call for help?  Watch closely for changes in your health, and be sure to contact your doctor if:  ?? You do not feel better after several weeks of home care.  ?? Your pain gets worse.  ?? Your leg becomes weak or numb.   Where can you learn  more?   Go to [facility]  Enter C901 in the search box to learn more about "Piriformis Syndrome: After Your Visit."   ?? 2006-2014 [employer], [employer]. Care instructions adapted under license by [employer] (which disclaims liability or warranty for this information). This care instruction is for use with your licensed healthcare professional. If  you have questions about a medical condition or this instruction, always ask your healthcare professional. [employer] disclaims any warranty or liability for your use of this information.  Content Version: 10.2.346038; Current as of: June 11, 2012

## 2012-12-09 NOTE — Telephone Encounter (Signed)
Patient here today for visit and states she has had a flu vaccine in November 2014. Will bring documentation.

## 2012-12-09 NOTE — Progress Notes (Signed)
HISTORY OF PRESENT ILLNESS  LONNIE ROSADO is a 47 y.o. female presents with Other and Hip Pain    Ms. Kushner is here today for an acute visit.   She reports that she is concerned that she may possibly have mono. She has been feeling fatigued, and her sister and both of her kids have both been diagnosed with it.     Also, she has been experiencing left hip pain since she ran in the marathon on 11/22/12. She points to her left buttocks when she describes the pain. She states that occasionally, she will feel a pulling in her left IT band distribution. She had a post race massage, and she had the effected area worked on a lot which has helped. She has been to PT in the past for tight hip flexors.   She denies any weakness of her left leg, or numbness, or tingling. Currently, she is experiencing some tightness behind her left knee as well.   Sitting for long periods of times makes the effected area of her buttocks very stiff.       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      ROS    Review of Systems negative except as noted above in HPI.    ALLERGIES:    Allergies   Allergen Reactions   ??? Aldactone [Spironolactone] Swelling   ??? Codeine Rash and Itching     Current Outpatient Prescriptions   Medication Sig Dispense Refill   ??? atorvastatin (LIPITOR) 40 mg tablet Take 1 tablet by mouth daily.  90 tablet  3   ??? levothyroxine (LEVOXYL) 88 mcg tablet Take 1 Tab by mouth daily.  90 Tab  3   ??? cholecalciferol, vitamin D3, (VITAMIN D3) 2,000 unit Tab Take 2,000 Units by mouth.       ??? b complex vitamins tablet Take 1 Tab by mouth daily.         ??? ascorbic acid (VITAMIN C) 500 mg tablet Take  by mouth.       ??? vitamin E (AQUA GEMS) 400 unit capsule Take 400 Units by mouth two (2) times a day.           PAST MEDICAL HISTORY:    Past Medical History   Diagnosis Date   ??? S/P tonsillectomy 07/25/2007   ??? S/P appendectomy 07/25/2007   ??? Obesity 07/25/2007   ??? Hyperlipidemia 07/25/2007   ???  Fatty liver 07/25/2007     In NASH study at [facility] (Dr. Kathleen Argue)   ??? Elevated triglycerides with high cholesterol 07/25/2007   ??? Depression 07/25/2007   ??? GE reflux 07/25/2007     Has improved with weight loss (has GI at [facility])   ??? HTN 07/25/2007   ??? Hypothyroidism 07/25/2007   ??? Pap smear for cervical cancer screening 08/02/10     [facility])   ??? Hx of mammogram 7/12     MRI, 6 mo   ??? Adrenal adenoma 12/12/2009     Saw Nephrologist for this    ??? Chest pain, unspecified 12/12/2009   ??? Diabetes mellitus, type 2        PAST SURGICAL HISTORY:    Past Surgical History   Procedure Laterality Date   ??? Pr appendectomy     ??? Hx tonsillectomy     ??? Hx breast reduction     ??? Hx cholecystectomy     ??? Pr abdomen surgery proc  unlisted  08/01/09     laprascopic right adrenalectomy       FAMILY HISTORY:    Family History   Problem Relation Age of Onset   ??? Hypertension Mother    ??? Breast Cancer Mother 52   ??? Stroke Mother    ??? Diabetes Father    ??? Hypertension Father    ??? Stroke Sister 20     Carotid Artery Dissection       SOCIAL HISTORY:    History     Social History   ??? Marital Status: UNKNOWN     Spouse Name: Single     Number of Children: 0   ??? Years of Education: N/A     Occupational History   ??? [employer]     Social History Main Topics   ??? Smoking status: Former Smoker -- 0.30 packs/day for 10 years     Types: Cigarettes     Quit date: 02/08/2009   ??? Smokeless tobacco: Never Used   ??? Alcohol Use: 0.5 oz/week     1 Glasses of wine per week   ??? Drug Use: No   ??? Sexually Active: Yes     Other Topics Concern   ??? Not on file     Social History Narrative   ??? No narrative on file       IMMUNIZATIONS:    Immunization History   Administered Date(s) Administered   ??? Influenza Vaccine Split 10/24/2010   ??? Pneumococcal Conjugate (PPSV-23) 12/22/2010   ??? Pneumococcal Vaccine 12/05/2005   ??? TD Vaccine 10/23/2000   ??? TDAP Vaccine 10/24/2010         PHYSICAL EXAMINATION    Vital Signs  BP 138/75   Pulse  71   Temp(Src) 98.8 ??F (37.1 ??C) (Oral)   Resp 18   Ht 5\' 5"  (1.651 m)   Wt 184 lb 14.4 oz (83.87 kg)   BMI 30.77 kg/m2   SpO2 99%   LMP 12/03/2012    General appearance - Well nourished. Well appearing.  Well developed.  No acute distress. Overweight.   Head - Normocephalic.  Atraumatic.    Eyes - Extraocular eye movements intact.   Ears - Hearing is grossly normal bilaterally.      Nose - normal and patent. No discharge noted.    Back - FROM of spine, with some pain noted with movement. No tenderness of spinous processes, but tenderness to left lumbar paraspinal muscles. Positive straight leg test into left buttocks.   Neurological - awake, alert. Cranial nerves II through XII grossly intact. Normal sensation and strength of bilateral lower extremities. Normal and symmetric DTRs of bilateral lower extremities. Clear speech.  Heme/Lymph - peripheral pulses normal x 4 extremities. No peripheral edema is noted.    Skin - no rashes, erythema, ecchymosis, noted, warm to touch  Psychological -   normal behavior, dress and thought processes.  Good insight. Good eye contact.  Normal affect.  Appropriate mood.  Normal speech.    ASSESSMENT and PLAN    Loy was seen today for other and hip pain.    Diagnoses and associated orders for this visit:    Low back pain  - XR SPINE LUMB 2 OR 3 V; Future  - REFERRAL TO PHYSICAL THERAPY    Sciatica  - XR SPINE LUMB 2 OR 3 V; Future  - REFERRAL TO PHYSICAL THERAPY    Exposure to mononucleosis syndrome  - MONO SCREEN W/ REFLX EBV  Discussed the patient's symptoms are likely originating from her low back rather than her hip.   She did have a positive SLT of the left leg.   Will obtain XRs at this time, due to the pain starting after her race. Will also refer for formal PT, and prescribe a muscle relaxant, and provided stretches and exercises that she can do at home until she starts PT.   Encouraged other symptomatic treatment, such as moist heat, and OTC pain medications.    Will follow up in 6 weeks to re-evaluate her pain and symptoms. If continued symptoms, will consider MRI at that time.   I encouraged her to notify me if she noticed worsening symptoms or progressive neurological symptoms prior to that time.    Patient expresses understanding of treatment plan and agrees with recommendations.          Patient Instructions       Learning About Relief for Back Pain  What is back tension and strain?     Back strain happens when you overstretch, or pull, a muscle in your back. You may hurt your back in an accident or when you exercise or lift something.  Most back pain will get better with rest and time. You can take care of yourself at home to help your back heal.  What can you do first to relieve back pain?  When you first feel back pain, try these steps:  ?? Walk. Take a short walk (10 to 20 minutes) on a level surface (no slopes, hills, or stairs) every 2 to 3 hours. Walk only distances you can manage without pain, especially leg pain.  ?? Relax. Find a comfortable position for rest. Some people are comfortable on the floor or a medium-firm bed with a small pillow under their head and another under their knees. Some people prefer to lie on their side with a pillow between their knees. Don't stay in one position for too long.  ?? Try heat or ice. Try using a heating pad on a low or medium setting, or take a warm shower, for 15 to 20 minutes every 2 to 3 hours. Or you can buy single-use heat wraps that last up to 8 hours. You can also try an ice pack for 10 to 15 minutes every 2 to 3 hours. You can use an ice pack or a bag of frozen vegetables wrapped in a thin towel. There is not strong evidence that either heat or ice will help, but you can try them to see if they help. You may also want to try switching between heat and cold.  ?? Take pain medicine exactly as directed.  ?? If the doctor gave you a prescription medicine for pain, take it as prescribed.  ?? If you are not taking a  prescription pain medicine, ask your doctor if you can take an over-the-counter medicine.  What else can you do?  ?? Stretch and exercise. Exercises that increase flexibility may relieve your pain and make it easier for your muscles to keep your spine in a good, neutral position. And don't forget to keep walking.  ?? Do self-massage. You can use self-massage to unwind after work or school or to energize yourself in the morning. You can easily massage your feet, hands, or neck. Self-massage works best if you are in comfortable clothes and are sitting or lying in a comfortable position. Use oil or lotion to massage bare skin.  ?? Reduce stress. Back pain can lead  to a vicious circle: Distress about the pain tenses the muscles in your back, which in turn causes more pain. Learn how to relax your mind and your muscles to lower your stress.   Where can you learn more?   Go to [facility]  Enter Q517 in the search box to learn more about "Learning About Relief for Back Pain."   ?? 2006-2014 [employer]. Care instructions adapted under license by [employer] (which disclaims liability or warranty for this information). This care instruction is for use with your licensed healthcare professional. If you have questions about a medical condition or this instruction, always ask your healthcare professional. [employer], [employer] disclaims any warranty or liability for your use of this information.  Content Version: 10.2.346038; Current as of: June 11, 2012              Low Back Pain: Exercises  Your Care Instructions  Here are some examples of typical rehabilitation exercises for your condition. Start each exercise slowly. Ease off the exercise if you start to have pain.  Your doctor or physical therapist will tell you when you can start these exercises and which ones will work best for you.  How to do the exercises  Press-up    1. Lie on your stomach, supporting your body with your forearms.   2. Press your elbows down into the floor to raise your upper back. As you do this, relax your stomach muscles and allow your back to arch without using your back muscles. As your press up, do not let your hips or pelvis come off the floor.  3. Hold for 15 to 30 seconds, then relax.  4. Repeat 2 to 4 times.  Alternate arm and leg (bird dog) exercise    Note: Do this exercise slowly. Try to keep your body straight at all times, and do not let one hip drop lower than the other.  1. Start on the floor, on your hands and knees.  2. Tighten your belly muscles.  3. Raise one leg off the floor, and hold it straight out behind you. Be careful not to let your hip drop down, because that will twist your trunk.  4. Hold for about 6 seconds, then lower your leg and switch to the other leg.  5. Repeat 8 to 12 times on each leg.  6. Over time, work up to holding for 10 to 30 seconds each time.  7. If you feel stable and secure with your leg raised, try raising the opposite arm straight out in front of you at the same time.  Knee-to-chest exercise    1. Lie on your back with your knees bent and your feet flat on the floor.  2. Bring one knee to your chest, keeping the other foot flat on the floor (or keeping the other leg straight, whichever feels better on your lower back).  3. Keep your lower back pressed to the floor. Hold for at least 15 to 30 seconds.  4. Relax, and lower the knee to the starting position.  5. Repeat with the other leg. Repeat 2 to 4 times with each leg.  6. To get more stretch, put your other leg flat on the floor while pulling your knee to your chest.  Curl-ups    1. Lie on the floor on your back with your knees bent at a 90-degree angle. Your feet should be flat on the floor, about 12 inches from your buttocks.  2. Cross your arms  over your chest.  3. Slowly tighten your belly muscles and raise your shoulder blades off the floor.  4. Keep your head in line with your body, and do not press your chin to your  chest.  5. Hold this position for 1 or 2 seconds, then slowly lower yourself back down to the floor.  6. Repeat 8 to 12 times.  Pelvic tilt exercise    1. Lie on your back with your knees bent.  2. "Brace" your stomach. This means to tighten your muscles by pulling in and imagining your belly button moving toward your spine. You should feel like your back is pressing to the floor and your hips and pelvis are rocking back.  3. Hold for about 6 seconds while you breathe smoothly.  4. Repeat 8 to 12 times.  Heel dig bridging    1. Lie on your back with both knees bent and your ankles bent so that only your heels are digging into the floor. Your knees should be bent about 90 degrees.  2. Then push your heels into the floor, squeeze your buttocks, and lift your hips off the floor until your shoulders, hips, and knees are all in a straight line.  3. Hold for about 6 seconds as you continue to breathe normally, and then slowly lower your hips back down to the floor and rest for up to 10 seconds.  4. Do 8 to 12 repetitions.  Hamstring stretch in doorway    1. Lie on your back in a doorway, with one leg through the open door.  2. Slide your leg up the wall to straighten your knee. You should feel a gentle stretch down the back of your leg.  3. Hold the stretch for at least 15 to 30 seconds. Do not arch your back, point your toes, or bend either knee. Keep one heel touching the floor and the other heel touching the wall.  4. Repeat with your other leg.  5. Do 2 to 4 times for each leg.  Hip flexor stretch    1. Kneel on the floor with one knee bent and one leg behind you. Place your forward knee over your foot. Keep your other knee touching the floor.  2. Slowly push your hips forward until you feel a stretch in the upper thigh of your rear leg.  3. Hold the stretch for at least 15 to 30 seconds. Repeat with your other leg.  4. Do 2 to 4 times on each side.  Wall sit    1. Stand with your back 10 to 12 inches away from a  wall.  2. Lean into the wall until your back is flat against it.  3. Slowly slide down until your knees are slightly bent, pressing your lower back into the wall.  4. Hold for about 6 seconds, then slide back up the wall.  5. Repeat 8 to 12 times.  Follow-up care is a key part of your treatment and safety. Be sure to make and go to all appointments, and call your doctor if you are having problems. It's also a good idea to know your test results and keep a list of the medicines you take.   Where can you learn more?   Go to [facility]  Enter [phone] in the search box to learn more about "Low Back Pain: Exercises."   ?? 2006-2014 [employer]. Care instructions adapted under license by [employer] (which disclaims liability or warranty for this information). This  care instruction is for use with your licensed healthcare professional. If you have questions about a medical condition or this instruction, always ask your healthcare professional. [employer] disclaims any warranty or liability for your use of this information.  Content Version: 10.2.346038; Current as of: June 11, 2012              Sciatica: After Your Visit  Your Care Instructions  Sciatica (say "sye-AT-ih-kuh") is an irritation of one of the sciatic nerves, which come from the spinal cord in the lower back. The sciatic nerves and their branches extend down through the buttock to the foot. Sciatica can develop when an injured disc in the back presses against a spinal nerve root. Its main symptom is pain, numbness, or weakness that is often worse in the leg or foot than in the back.  Sciatica often will improve and go away with time. Early treatment usually includes medicines and exercises to relieve pain.  Follow-up care is a key part of your treatment and safety. Be sure to make and go to all appointments, and call your doctor if you are having problems. It???s also a good idea to know your test results and keep  a list of the medicines you take.  How can you care for yourself at home?  ?? Take pain medicines exactly as directed.  ?? If the doctor gave you a prescription medicine for pain, take it as prescribed.  ?? If you are not taking a prescription pain medicine, ask your doctor if you can take an over-the-counter medicine.  ?? Put ice or a cold pack on the middle of your lower back for 10 to 20 minutes several times each day. Put a thin cloth between the ice and your skin.  ?? After the first 2 or 3 days, use a warm pack or heating pad for 20 minutes at a time. Hot showers will also help. Hot baths may help as long as you can lie or sit in a position that does not stress your back. You may also keep using ice if it helps.  ?? Avoid sitting if possible, unless it feels better than standing.  ?? Alternate lying down with short walks. Increase your walking distance as you are able to without making your symptoms worse.  ?? Do not do anything that makes your symptoms worse.  When should you call for help?  Call 911 anytime you think you may need emergency care. For example, call if:  ?? You have sudden weakness or numbness in both legs.  ?? You lose bowel or bladder control.  ?? You suddenly cannot walk or stand.  Call your doctor now or seek immediate medical care if:  ?? You have weakness in your ankle or leg.  ?? You have new pain, numbness, tingling, or weakness, especially in the buttocks, genital or rectal area, legs, or feet.  ?? You have symptoms of a urinary infection. For example:  ?? You have blood or pus in your urine.  ?? You have pain in your back just below your rib cage. This is called flank pain.  ?? You have a fever, chills, or body aches.  ?? It hurts to urinate.  ?? You have groin or belly pain.  Watch closely for changes in your health, and be sure to contact your doctor if:  ?? Your back pain gets worse or more frequent.  ?? Your back pain is not getting better after 1 week of home treatment. It  may take a lot longer for  the pain to go away completely, but it should feel at least a little better.   Where can you learn more?   Go to [facility]  Enter Z239 in the search box to learn more about "Sciatica: After Your Visit."   ?? 2006-2014 [employer]. Care instructions adapted under license by [employer] (which disclaims liability or warranty for this information). This care instruction is for use with your licensed healthcare professional. If you have questions about a medical condition or this instruction, always ask your healthcare professional. [employer] disclaims any warranty or liability for your use of this information.  Content Version: 10.2.346038; Current as of: June 11, 2012              Piriformis Syndrome: After Your Visit  Your Care Instructions     The piriformis muscle is deep under your rear end (buttock). One end of the muscle connects deep inside the pelvic area, and the other end attaches to the top of the thighbone. This muscle can press on the sciatic nerve that runs from your spine down your leg. When this happens, you may have pain, numbness, and tingling in the buttock and down the back of your leg. This is called piriformis syndrome. The pain may get worse when you sit for a long time or climb stairs. Also, you may be more likely to develop piriformis syndrome if you run or walk often.  Your doctor will check for other causes of your pain before treating this syndrome. Treatment may include stretching exercises, massage, and medicine for the pain and swelling. If these do not help, you may get a shot of steroid medicine. Until the pain is gone, you may need to rest the muscle and limit activities like running. Exercises and a change in how you move and sit may be enough to stop the pressure on the nerve.  Follow-up care is a key part of your treatment and safety. Be sure to make and go to all appointments, and call your doctor if you are having problems.  It's also a good idea to know your test results and keep a list of the medicines you take.  How can you care for yourself at home?  ?? If your doctor thinks that strenuous exercise is causing your problem, stop or cut back on activities such as running. You may find swimming to be a good exercise for a while.  ?? Stretch the piriformis muscle.  ?? Lie on your back.  ?? Bend one leg at the knee and keep the other leg flat on the ground.  ?? Raise your bent knee up and then move it across your body. Hold the outside of the knee with the opposite hand.  ?? Gently pull the knee with your hand toward the opposite shoulder.  ?? Hold the stretch for at least 15 to 30 seconds. Switch legs.  ?? Do the stretch several times each day.  ?? Massage the muscle to relieve pressure.  ?? Sit on the floor. Lean to one side so that the hip on your sore side is off the ground. Put a tennis ball under your buttock on that side.  ?? As you put weight onto the tennis ball, you may find spots that are especially sore. Move gently so that the tennis ball gently massages each of the sore spots.  ?? Use ice or heat to help reduce pain. Put ice or a cold pack or  a heating pad set on low or a warm cloth on the sore area for 10 to 20 minutes at a time. Put a thin cloth between the ice pack or heating pad and your skin.  ?? Take anti-inflammatory medicines to reduce pain and swelling. These include ibuprofen (Advil, Motrin) and naproxen (Aleve). Read and follow all instructions on the label.  ?? Have your doctor or a physical therapist watch how you move. You may need physical therapy or special inserts in your shoes (orthotics) to help you move in a way that does not put pressure on your nerves.  When should you call for help?  Watch closely for changes in your health, and be sure to contact your doctor if:  ?? You do not feel better after several weeks of home care.  ?? Your pain gets worse.  ?? Your leg becomes weak or numb.   Where can you learn more?   Go to  [facility]  Enter C901 in the search box to learn more about "Piriformis Syndrome: After Your Visit."   ?? 2006-2014 [employer]. Care instructions adapted under license by [employer] (which disclaims liability or warranty for this information). This care instruction is for use with your licensed healthcare professional. If you have questions about a medical condition or this instruction, always ask your healthcare professional. [employer] disclaims any warranty or liability for your use of this information.  Content Version: 10.2.346038; Current as of: June 11, 2012                    Odie Sera, DO

## 2012-12-10 NOTE — Progress Notes (Signed)
Quick Note:    Voicemail left for patient requesting a return call regarding results.  ______

## 2012-12-10 NOTE — Progress Notes (Signed)
Quick Note:    Please inform the patient that her low back XR demonstrated some mild arthritic changes. Thank you  ______

## 2012-12-11 ENCOUNTER — Encounter

## 2012-12-11 NOTE — Telephone Encounter (Signed)
Message copied by Antionette Char on Thu Dec 11, 2012 10:45 AM  ------       Message from: Vena Rua E       Created: Thu Dec 11, 2012  8:26 AM       Regarding: Dr. Laddie Aquas         [facility](804) [phone]   Pt left a voice message today, 12/11/12 at 8:20AM.  Pt advised, the pharmacy did not receive the muscle relaxer for her back pain.  Pt would like a call back advising the status and the results of her back x-ray.  Pt was seen on 12/09/12.  I attempted to reach the pt; left a voice message advising her message was received.   Can leave a voice message.  ------

## 2012-12-11 NOTE — Telephone Encounter (Signed)
Voicemail left for patient, advising that medication Flexeril has been sent to local pharmacy.

## 2012-12-11 NOTE — Progress Notes (Signed)
Quick Note:    Please ensure that patient is aware that an MRI has been ordered to further investigate her sciatic symptoms. Thanks  ______

## 2012-12-11 NOTE — Telephone Encounter (Signed)
Message from in health.

## 2012-12-11 NOTE — Progress Notes (Signed)
Spoke with patient and advised of results per Dr.Harner, understanding verbalized.

## 2012-12-11 NOTE — Progress Notes (Signed)
Quick Note:    Patient advised,understanding verbalized.  ______

## 2012-12-11 NOTE — Telephone Encounter (Signed)
Last office visit 12/09/12. Patient requesting medication be sent to local pharmacy.

## 2012-12-11 NOTE — Telephone Encounter (Signed)
Call placed with patient, message left advising patient of xray results per Dr.Harner. Writer also informed patient (via voicemail) that muscle relaxer was sent to mail order pharmacy via Dr.Harner. Patient instructed to contact office for any additional questions or concerns

## 2012-12-13 LAB — MONO SCREEN W/ REFLX EBV: Mono, qual.: NEGATIVE

## 2012-12-15 ENCOUNTER — Encounter

## 2012-12-15 NOTE — Progress Notes (Signed)
Quick Note:    Please inform the patient that her mono testing was negative. Thanks  ______

## 2012-12-15 NOTE — Telephone Encounter (Signed)
Please let the patient know that I changed the order to also do a MRI of the thoracic spine in addition to her lumbar spine. Thanks

## 2012-12-15 NOTE — Progress Notes (Signed)
Quick Note:    Letter mailed.  ______

## 2012-12-15 NOTE — Telephone Encounter (Signed)
Patient is calling regarding MRI and would like order to be changed to both T-spine and L-Spine if okay with Dr. Jamie Kato.  Patient states she has been under PT in the past  for problem with T-spine and feels like she is laying on a marble in the middle of the T-spine area.  Please advise and place new order if in agreement.   Patient only wants to obtain MRI once.  [phone].

## 2012-12-15 NOTE — Telephone Encounter (Signed)
Patient contacted via phone and advised of change in MRI order per Dr.Harner, understanding verbalized.

## 2012-12-26 NOTE — Telephone Encounter (Signed)
Patient is returning call to Dr. Jamie Kato in regards to MRI results.  Please call patient at work number.  [phone].

## 2012-12-26 NOTE — Progress Notes (Signed)
Quick Note:    Please inform the patient that she continues to demonstrate T10-11 there is degenerative change otherwise everything looks normal. Her disc spaces, alignment, and spinal cord all look normal. Thanks    ______

## 2012-12-26 NOTE — Progress Notes (Signed)
Quick Note:    Voicemail left for patient requesting return call regarding results.  ______

## 2012-12-26 NOTE — Progress Notes (Signed)
Quick Note:    Voicemail left for patient, (per pt request) advising of recommendations per Dr.Harner. Patient provided with Dr.Abilio Mammie Russian (Ortho) for further treatment options.Patient instructed to contact office with any further questions.  ______

## 2012-12-26 NOTE — Progress Notes (Signed)
Quick Note:    Patient returned call and made aware of MRI results per Dr.Harner. Patient requesting additional informations regarding further treatment options. Please advise.  ______

## 2012-12-26 NOTE — Progress Notes (Signed)
Quick Note:    Please inform the patient that she does have moderate arthritis changes at level L3-L4 of her lumbar spine, but otherwise she has minimal changes.  ______

## 2012-12-26 NOTE — Telephone Encounter (Signed)
Return call made with patient see documentation in results notes.

## 2013-01-20 NOTE — Patient Instructions (Signed)
Hyperlipidemia: After Your Visit  Your Care Instructions  Hyperlipidemia is too much fat in your blood. The body has several kinds of fat, including cholesterol and triglycerides. Your body needs fat for many things, such as making new cells. But too much fat in your blood increases your chances of having a heart attack or stroke.  You may be able to lower your cholesterol and triglycerides with a heart-healthy diet, exercise, and if needed, medicine. Your doctor may want you to try lifestyle changes first to see whether they lower the fat in your blood. You may need to take medicine if lifestyle changes do not lower the fat in your blood enough.  Follow-up care is a key part of your treatment and safety. Be sure to make and go to all appointments, and call your doctor if you are having problems. It???s also a good idea to know your test results and keep a list of the medicines you take.  How can you care for yourself at home?  Take your medicines  ?? Take your medicines exactly as prescribed. Call your doctor if you think you are having a problem with your medicine.  ?? If you take medicine to lower your cholesterol, go to follow-up visits. You will need to have blood tests.  ?? Do not take large doses of niacin, which is a B vitamin, while taking medicine called statins. It may increase the chance of muscle pain and liver problems.  ?? Talk to your doctor about avoiding grapefruit juice if you are taking statins. Grapefruit juice can raise the level of this medicine in your blood. This could increase side effects.  Eat more fruits, vegetables, and fiber  ?? Fruits and vegetables have lots of nutrients that help protect against heart disease, and they have little???if any???fat. Try to eat at least five servings a day. Dark green, deep orange, or yellow fruits and vegetables are healthy choices.  ?? Keep carrots, celery, and other veggies handy for snacks. Buy fruit that is in season and store it where you can see it so that  you will be tempted to eat it. Cook dishes that have a lot of veggies in them, such as stir-fries and soups.  ?? Foods high in fiber may reduce your cholesterol and provide important vitamins and minerals. High-fiber foods include whole-grain cereals and breads, oatmeal, beans, brown rice, citrus fruits, and apples.  ?? Buy whole-grain breads and cereals instead of white bread and pastries.  Limit saturated fat  ?? Read food labels and try to avoid saturated fat and trans fat. They increase your risk of heart disease.  ?? Use olive or canola oil when you cook. Try cholesterol-lowering spreads, such as Benecol or Take Control.  ?? Bake, broil, grill, or steam foods instead of frying them.  ?? Limit the amount of high-fat meats you eat, including hot dogs and sausages. Cut out all visible fat when you prepare meat.  ?? Eat fish, skinless poultry, and soy products such as tofu instead of high-fat meats. Soybeans may be especially good for your heart. Eat at least two servings of fish a week. Certain fish, such as salmon, contain omega-3 fatty acids, which may help reduce your risk of heart attack.  ?? Choose low-fat or fat-free milk and dairy products.  Get exercise, limit alcohol, and quit smoking  ?? Get more exercise. Work with your doctor to set up an exercise program. Even if you can do only a small amount, exercise will help   you get stronger, have more energy, and manage your weight and your stress. Walking is an easy way to get exercise. Gradually increase the amount you walk every day. Aim for at least 30 minutes on most days of the week. You also may want to swim, bike, or do other activities.  ?? Limit alcohol to no more than 2 drinks a day for men and 1 drink a day for women.  ?? Do not smoke. If you need help quitting, talk to your doctor about stop-smoking programs and medicines. These can increase your chances of quitting for good.  When should you call for help?  Call 911 anytime you think you may need emergency  care. For example, call if:  ?? You have symptoms of a heart attack. These may include:  ?? Chest pain or pressure, or a strange feeling in the chest.  ?? Sweating.  ?? Shortness of breath.  ?? Nausea or vomiting.  ?? Pain, pressure, or a strange feeling in the back, neck, jaw, or upper belly or in one or both shoulders or arms.  ?? Lightheadedness or sudden weakness.  ?? A fast or irregular heartbeat.  After you call 911, the operator may tell you to chew 1 adult-strength or 2 to 4 low-dose aspirin. Wait for an ambulance. Do not try to drive yourself.  ?? You have signs of a stroke. These may include:  ?? Sudden numbness, paralysis, or weakness in your face, arm, or leg, especially on only one side of your body.  ?? New problems with walking or balance.  ?? Sudden vision changes.  ?? Drooling or slurred speech.  ?? New problems speaking or understanding simple statements, or feeling confused.  ?? A sudden, severe headache that is different from past headaches.  ?? You passed out (lost consciousness).  Call your doctor now or seek immediate medical care if:  ?? You have muscle pain or weakness.  Watch closely for changes in your health, and be sure to contact your doctor if:  ?? You are very tired.  ?? You have an upset stomach, gas, constipation, or belly pain or cramps.   Where can you learn more?   Go to http://www.healthwise.net/[facility]  Enter C406 in the search box to learn more about "Hyperlipidemia: After Your Visit."   ?? 2006-2013 Healthwise, Incorporated. Care instructions adapted under license by [employer] (which disclaims liability or warranty for this information). This care instruction is for use with your licensed healthcare professional. If you have questions about a medical condition or this instruction, always ask your healthcare professional. Healthwise, Incorporated disclaims any warranty or liability for your use of this information.  Content Version: 9.9.209917; Last Revised: October 20, 2009

## 2013-01-20 NOTE — Progress Notes (Signed)
HISTORY OF PRESENT ILLNESS  Katrina [name] is a 48 y.o. female presents with Back Pain and Medication Evaluation    The patient reports her back pain is overall doing better.   She continues to do stretching and exercises at home, which is helpful. She does find when she stands for long periods of time, she will have some increased pain in her back with some radiation into her left leg.   She manages her pain with stretching, ice/heat, and the occasional muscle relaxant. She is still very active, and is doing Pilates, and is interested in starting Yoga. She also runs, and has not had any increased pain on the couple of runs she has done recently.     Also, she is interested in decreasing her statin medication. She has worked hard for weight loss with diet and exercise to get off her blood pressure medications and diabetes medications.   Otherwise, she is feeling well, and denies any major concerns.     Agree with nurse note.      ROS    Review of Systems negative except as noted above in HPI.    ALLERGIES:    Allergies   Allergen Reactions   ??? Aldactone [Spironolactone] Swelling   ??? Codeine Rash and Itching     Current Outpatient Prescriptions   Medication Sig Dispense Refill   ??? nystatin-triamcinolone (MYCOLOG) 100,000-0.1 unit/gram-% ointment as needed.       ??? cyclobenzaprine (FLEXERIL) 10 mg tablet 10 mg as needed.       ??? atorvastatin (LIPITOR) 40 mg tablet Take 1 tablets by mouth daily.  90 tablet  3   ??? levothyroxine (LEVOXYL) 88 mcg tablet Take 1 Tab by mouth daily.  90 Tab  3   ??? cholecalciferol, vitamin D3, (VITAMIN D3) 2,000 unit Tab Take 2,000 Units by mouth.       ??? b complex vitamins tablet Take 1 Tab by mouth daily.         ??? ascorbic acid (VITAMIN C) 500 mg tablet Take  by mouth.       ??? vitamin E (AQUA GEMS) 400 unit capsule Take 400 Units by mouth two (2) times a day.           PAST MEDICAL HISTORY:    Past Medical History   Diagnosis Date   ??? S/P tonsillectomy 07/25/2007   ??? S/P appendectomy  07/25/2007   ??? Obesity 07/25/2007   ??? Hyperlipidemia 07/25/2007   ??? Fatty liver 07/25/2007     In NASH study at [facility] (Dr. Kathleen Argue)   ??? Elevated triglycerides with high cholesterol 07/25/2007   ??? Depression 07/25/2007   ??? GE reflux 07/25/2007     Has improved with weight loss (has GI at [facility])   ??? HTN 07/25/2007   ??? Hypothyroidism 07/25/2007   ??? Pap smear for cervical cancer screening 08/02/10     Angelena S[facility])   ??? Hx of mammogram 7/12     MRI, 6 mo   ??? Adrenal adenoma 12/12/2009     Saw Nephrologist for this    ??? Chest pain, unspecified 12/12/2009   ??? Diabetes mellitus, type 2    ??? Osteoarthritis of lumbar spine      MRI 12/14   ??? Osteoarthritis of thoracic spine      MRI 12/14       PAST SURGICAL HISTORY:    Past Surgical History   Procedure Laterality Date   ???  Pr appendectomy     ??? Hx tonsillectomy     ??? Hx breast reduction     ??? Hx cholecystectomy     ??? Pr abdomen surgery proc unlisted  08/01/09     laprascopic right adrenalectomy       FAMILY HISTORY:    Family History   Problem Relation Age of Onset   ??? Hypertension Mother    ??? Breast Cancer Mother [id number]   ??? Stroke Mother    ??? Diabetes Father    ??? Hypertension Father    ??? Stroke Sister [phone]     Carotid Artery Dissection       SOCIAL HISTORY:    History     Social History   ??? Marital Status: SINGLE     Spouse Name: Single     Number of Children: 0   ??? Years of Education: N/A     Occupational History   ??? [employer]     Social History Main Topics   ??? Smoking status: Former Smoker -- 0.30 packs/day for 10 years     Types: Cigarettes     Quit date: 02/08/2009   ??? Smokeless tobacco: Never Used   ??? Alcohol Use: 0.5 oz/week     1 Glasses of wine per week   ??? Drug Use: No   ??? Sexually Active: Yes     Other Topics Concern   ??? Not on file     Social History Narrative   ??? No narrative on file       IMMUNIZATIONS:    Immunization History   Administered Date(s) Administered   ??? Influenza Vaccine Split 10/24/2010   ??? Pneumococcal Polysaccharide  (PPV-23) 12/22/2010   ??? Pneumococcal Vaccine 12/05/2005   ??? TD Vaccine 10/23/2000   ??? TDAP Vaccine 10/24/2010         PHYSICAL EXAMINATION    Vital Signs  BP 132/76   Pulse 71   Temp(Src) 98.2 ??F (36.8 ??C) (Oral)   Resp 18   Ht 5\' 5"  (1.651 m)   Wt 184 lb 14.4 oz (83.87 kg)   BMI 30.77 kg/m2   SpO2 98%   LMP 12/29/2012    General appearance - Well nourished. Well appearing.  Well developed.  No acute distress. Overweight.   Head - Normocephalic.  Atraumatic.    Eyes - Extraocular eye movements intact.   Ears - Hearing is grossly normal bilaterally.      Neurological - awake, alert. Cranial nerves II through XII grossly intact.  Clear speech.  Psychological -   normal behavior, dress and thought processes.  Good insight. Good eye contact.  Normal affect.  Appropriate mood.  Normal speech.    DATA REVIEWED    MRI Results (most recent):    Results from Hospital Encounter encounter on 12/25/12   MRI LUMB SPINE WO CONT   Narrative **Final Report**      ICD Codes / Adm.Diagnosis: 724.2  724.3 / Lumbago  Sciatica  Examination:  MR L SPINE WO CON  - [id number] - Dec 25 2012  6:40PM  Accession No:  [id number]  Reason:  radicular pain      REPORT:  EXAM:  MR L SPINE WO CON    INDICATION:  radicular pain     COMPARISON: None    TECHNIQUE: MR imaging of the lumbar spine was performed with sagittal T1,   T2, STIR;  axial T1, T2.     CONTRAST:  None.  FINDINGS:    There is normal alignment of the lumbar spine. Vertebral body heights are   maintained. Marrow signal is normal.     The conus medullaris terminates at    . Signal and caliber of the distal   spinal cord are within normal limits.    The paraspinal soft tissues are within normal limits.        Lower thoracic spine: No herniation or stenosis.    L1-L2:  No herniation or stenosis.        L2-L3:  No herniation or stenosis.        L3-L4:  Moderate degenerative facet change. Mild left lateral recess   stenosis.        L4-L5:  Minimal degenerative facet change. No spinal or  foraminal stenosis       L5-S1:  Minimal degenerative facet change. No spinal or foraminal stenosis.         IMPRESSION:  Mild degenerative changes detailed above           Signing/Reading Doctor: Maudie Flakes [phone])    Approved: Maudie Flakes [phone])  Dec 26 2012  8:08AM                                     ASSESSMENT and PLAN    Katrina [name] was seen today for back pain and medication evaluation.    Diagnoses and associated orders for this visit:    Thoracic spondylosis    Osteoarthritis of lumbar spine    Sciatica of left side    Hyperlipidemia  - atorvastatin (LIPITOR) 40 mg tablet; Take 0.5 tablets by mouth daily.    I reviewed the patient's MRI with her today, and discussed the results of both her thoracic and lumbar MRI. I encouraged her to remain active, and focus on her core strength, and hamstring flexibility to maintain a healthy back. I also encouraged her to consider OMT.     Reviewed the patient's HDL lab work with her again today. She is interested in decreasing her statin by half. Her cholesterol panel is at goal, will decrease to 20mg  of the Lipitor, and recheck her levels in about 6 weeks.   Provided encouragement and praised the patient for taking such an active interest in her health.         Patient expresses understanding of treatment plan and agrees with recommendations.          Patient Instructions       Hyperlipidemia: After Your Visit  Your Care Instructions  Hyperlipidemia is too much fat in your blood. The body has several kinds of fat, including cholesterol and triglycerides. Your body needs fat for many things, such as making new cells. But too much fat in your blood increases your chances of having a heart attack or stroke.  You may be able to lower your cholesterol and triglycerides with a heart-healthy diet, exercise, and if needed, medicine. Your doctor may want you to try lifestyle changes first to see whether they lower the fat in your blood. You may need to take medicine  if lifestyle changes do not lower the fat in your blood enough.  Follow-up care is a key part of your treatment and safety. Be sure to make and go to all appointments, and call your doctor if you are having problems. It???s also a good idea to know your test results and keep a  list of the medicines you take.  How can you care for yourself at home?  Take your medicines  ?? Take your medicines exactly as prescribed. Call your doctor if you think you are having a problem with your medicine.  ?? If you take medicine to lower your cholesterol, go to follow-up visits. You will need to have blood tests.  ?? Do not take large doses of niacin, which is a B vitamin, while taking medicine called statins. It may increase the chance of muscle pain and liver problems.  ?? Talk to your doctor about avoiding grapefruit juice if you are taking statins. Grapefruit juice can raise the level of this medicine in your blood. This could increase side effects.  Eat more fruits, vegetables, and fiber  ?? Fruits and vegetables have lots of nutrients that help protect against heart disease, and they have little???if any???fat. Try to eat at least five servings a day. Dark green, deep orange, or yellow fruits and vegetables are healthy choices.  ?? Keep carrots, celery, and other veggies handy for snacks. Buy fruit that is in season and store it where you can see it so that you will be tempted to eat it. Cook dishes that have a lot of veggies in them, such as stir-fries and soups.  ?? Foods high in fiber may reduce your cholesterol and provide important vitamins and minerals. High-fiber foods include whole-grain cereals and breads, oatmeal, beans, brown rice, citrus fruits, and apples.  ?? Buy whole-grain breads and cereals instead of white bread and pastries.  Limit saturated fat  ?? Read food labels and try to avoid saturated fat and trans fat. They increase your risk of heart disease.  ?? Use olive or canola oil when you cook. Try cholesterol-lowering  spreads, such as Benecol or Take Control.  ?? Bake, broil, grill, or steam foods instead of frying them.  ?? Limit the amount of high-fat meats you eat, including hot dogs and sausages. Cut out all visible fat when you prepare meat.  ?? Eat fish, skinless poultry, and soy products such as tofu instead of high-fat meats. Soybeans may be especially good for your heart. Eat at least two servings of fish a week. Certain fish, such as salmon, contain omega-3 fatty acids, which may help reduce your risk of heart attack.  ?? Choose low-fat or fat-free milk and dairy products.  Get exercise, limit alcohol, and quit smoking  ?? Get more exercise. Work with your doctor to set up an exercise program. Even if you can do only a small amount, exercise will help you get stronger, have more energy, and manage your weight and your stress. Walking is an easy way to get exercise. Gradually increase the amount you walk every day. Aim for at least 30 minutes on most days of the week. You also may want to swim, bike, or do other activities.  ?? Limit alcohol to no more than 2 drinks a day for men and 1 drink a day for women.  ?? Do not smoke. If you need help quitting, talk to your doctor about stop-smoking programs and medicines. These can increase your chances of quitting for good.  When should you call for help?  Call 911 anytime you think you may need emergency care. For example, call if:  ?? You have symptoms of a heart attack. These may include:  ?? Chest pain or pressure, or a strange feeling in the chest.  ?? Sweating.  ?? Shortness of breath.  ??  Nausea or vomiting.  ?? Pain, pressure, or a strange feeling in the back, neck, jaw, or upper belly or in one or both shoulders or arms.  ?? Lightheadedness or sudden weakness.  ?? A fast or irregular heartbeat.  After you call 911, the operator may tell you to chew 1 adult-strength or 2 to 4 low-dose aspirin. Wait for an ambulance. Do not try to drive yourself.  ?? You have signs of a stroke. These  may include:  ?? Sudden numbness, paralysis, or weakness in your face, arm, or leg, especially on only one side of your body.  ?? New problems with walking or balance.  ?? Sudden vision changes.  ?? Drooling or slurred speech.  ?? New problems speaking or understanding simple statements, or feeling confused.  ?? A sudden, severe headache that is different from past headaches.  ?? You passed out (lost consciousness).  Call your doctor now or seek immediate medical care if:  ?? You have muscle pain or weakness.  Watch closely for changes in your health, and be sure to contact your doctor if:  ?? You are very tired.  ?? You have an upset stomach, gas, constipation, or belly pain or cramps.   Where can you learn more?   Go to [employer]  Enter C406 in the search box to learn more about "Hyperlipidemia: After Your Visit."   ?? 2006-2013 Healthwise, Incorporated. Care instructions adapted under license by [employer] (which disclaims liability or warranty for this information). This care instruction is for use with your licensed healthcare professional. If you have questions about a medical condition or this instruction, always ask your healthcare professional. [place] any warranty or liability for your use of this information.  Content Version: 9.9.209917; Last Revised: October 20, 2009                    Brain Hilts, DO

## 2013-01-20 NOTE — Progress Notes (Signed)
Chief Complaint   Patient presents with   ??? Back Pain   ??? Medication Evaluation     Patient would like to discuss decereasing dose of lipitor

## 2013-01-26 NOTE — Progress Notes (Signed)
Chief Complaint   Patient presents with   ??? Osteopathic Manipulation Treatment     1. Have you been to the ER, urgent care clinic since your last visit?  Hospitalized since your last visit? NO    2. Have you seen or consulted any other health care providers outside of the [facility] since your last visit?  Include any pap smears or colon screening. no

## 2013-01-26 NOTE — Progress Notes (Signed)
HPI    Katrina [name] is a 48 y.o. female presents to our office to discuss her back pain today.     Katrina [name] has been experiencing left hip pain since she ran in the marathon on 11/22/12. The pain is improving, however.  She states that occasionally, she will feel a pulling in her left IT band distribution.  She has been to PT in the past for tight hip flexors.   She has some tightness in her thoracic spine and lumbar spine,and she feels that she has decreased mobility. She is very active, and still does some running, piliates, yoga, arc training, elliptical and bike training and some weight training.     She denies any weakness of her left leg, or numbness, or tingling, fevers, chills, saddle esthesia, urinary/bowel retention or incontinence.   Sitting for long periods of times makes the effected area of her buttocks very stiff, as well as prolonged standing. Moving around does alleviate her pain.         Agree with nurse history.     Current Outpatient Prescriptions   Medication Sig Dispense Refill   ??? nystatin-triamcinolone (MYCOLOG) 100,000-0.1 unit/gram-% ointment as needed.       ??? cyclobenzaprine (FLEXERIL) 10 mg tablet 10 mg as needed.       ??? atorvastatin (LIPITOR) 40 mg tablet Take 0.5 tablets by mouth daily.  90 tablet  3   ??? levothyroxine (LEVOXYL) 88 mcg tablet Take 1 Tab by mouth daily.  90 Tab  3   ??? cholecalciferol, vitamin D3, (VITAMIN D3) 2,000 unit Tab Take 2,000 Units by mouth.       ??? b complex vitamins tablet Take 1 Tab by mouth daily.         ??? ascorbic acid (VITAMIN C) 500 mg tablet Take  by mouth.       ??? vitamin E (AQUA GEMS) 400 unit capsule Take 400 Units by mouth two (2) times a day.         Past Medical History   Diagnosis Date   ??? S/P tonsillectomy 07/25/2007   ??? S/P appendectomy 07/25/2007   ??? Obesity 07/25/2007   ??? Hyperlipidemia 07/25/2007   ??? Fatty liver 07/25/2007     In NASH study at [facility] (Dr. Hilary Hertz)   ??? Elevated triglycerides with high cholesterol 07/25/2007   ???  Depression 07/25/2007   ??? GE reflux 07/25/2007     Has improved with weight loss (has GI at [facility])   ??? HTN 07/25/2007   ??? Hypothyroidism 07/25/2007   ??? Pap smear for cervical cancer screening 08/02/10     Katrina [name] [facility])   ??? Hx of mammogram 7/12     MRI, 6 mo   ??? Adrenal adenoma 12/12/2009     Saw Nephrologist for this    ??? Chest pain, unspecified 12/12/2009   ??? Diabetes mellitus, type 2    ??? Osteoarthritis of lumbar spine      MRI 12/14   ??? Osteoarthritis of thoracic spine      MRI 12/14         ROS  Katrina [name] denies fever, chills, headache, vision changes, SOB, chest pain or tightness, abdominal pain, fecal or new urinary incontinence, weakness, gait change, numbness, tingling, swelling, and skin changes.  ROS is otherwise negative.    PHYSICAL EXAMINATION  BP 127/90   Pulse 75   Temp(Src) 99.5 ??F (37.5 ??C) (Oral)   Resp 16  Ht 5\' 5"  (1.651 m)   Wt 184 lb 11.2 oz (83.779 kg)   BMI 30.74 kg/m2   SpO2 98%   LMP 12/29/2012    General appearance - Well nourished. Well appearing.  Well developed.  Mild discomfort noted with position change. Overweight.  Cooperative.  Difficulty relaxing.  Head - normocephalic.  Atraumatic. Increased occipital muscle tension noted.    Neck - FROM, supple, no significant adenopathy.    Heme/Lymph - peripheral pulses normal x 4 extremities.  No peripheral edema is noted.  Skin - no rashes, erythema, ecchymosis, lacerations, abrasions  Back exam - limited range of motion in rotation bilaterally.  Increased muscle tension in the paravertebral musculature in the thoracic, lumbar and sacral regions.  No pain on palpation of the spinous processes in the cervical, thoracic, lumbar, sacral regions.  No CVA tenderness.  Pain noted with palpation to paraspinal muscles of the thoracic and lumbar spine.  Pelvis -  Positive Standing Flexion Test on the left.  Positive Seated Flexion Test on the left. No pain on palpation of the pelvis, pubes.  ASIS is higher on the  right.  PSIS is higher on the left.  (Left Anterior Innominate) Pelvic rotation to the right noted.    Sacrum - Decreased Spring.  Sacrum is rotated to the left.  Neurological - awake, alert and oriented to person, place, and time and event.  Cranial nerves II through XII intact  Normal speech.  No focal findings.  Muscle strength is +5/5 x 4 extremities.  Sensation is intact to light touch bilaterally.  Steady gait.  Negative Straight Leg Test bilaterally.    Musculoskeletal - Intact x 4 extremities.  Full ROM x 4 extremities.  No pain with movement.  No pain on palpation of the bilateral shoulders, elbows, wrists, hands.  No tenderness in the pelvis, pubic bone, bilateral hips, knees, ankles.  No obvious deformity or swelling.  Tight hamstrings and IT bands noted bilaterally. Spasming left Piriformis Muscle    ASSESSMENT/PLAN    OMT Procedure was discussed with the patient. Verbal consent was received.  Osteopathic Manipulation Therapy was performed during this visit.   Procedure Note:   Muscle Energy: Location:   lumbars: Result:   good release and increased symmetry and ROM after treatment, pelvis: Result:   good release and increased symmetry and ROM after treatment and lower extremities: Result:   good release and increased symmetry and ROM after treatment  Myofascial Releases: Location:   thoracics: Result:   good release and increased symmetry after treatment, lumbars: Result: good release and increased symmetry after treatment and sacral: Result: good release and increased symmetry after treatment  Counterstrain: Location:   lower extremities: Result:  50% improvment after treatment  Pubic Release: Result: reset pelvis and sacrum  Myofascial Stretching : Result:  decreased muscle tension  Lumbar Roll  lumbars: Result:   good release and increased symmetry and ROM after treatment  Sacral Compression: Result: increase sacral motility  Advised patient:   1. Do not self adjust.  2. Increase water intake.  3.  May be more uncomfortable the first 24-48 hours after receiving OMT. Use OTC Tylenol Arthritis or Ibuprofen up to 800mg  with food every 8 hours as needed for  pain.  4. Avoid lifting, pushing or pulling more than 5 to 10 pounds until better.   5. Avoid prolonged sitting, standing, bending, reaching or lying down until better.  6. Use moist heat as needed for 20 minutes several times daily.  Can alternate with ice.  7. Do exercises as demonstrated.  Refer to handouts provided.  8. Avoid triggering activities.  9. WATCH YOUR POSTURE.  10. Return to our office for a follow up visit 2-4  weeks or sooner if symptoms worsen or persist.  Patient expresses understanding of treatment plan and agrees with recommendations.      Odie Serahristina Harner, DO

## 2013-01-26 NOTE — Patient Instructions (Signed)
Advised patient:   1. Do not self adjust.  2. Increase water intake.  3. May be more uncomfortable the first 24-48 hours after receiving OMT. Use OTC Tylenol Arthritis or Ibuprofen up to 800mg with food every 8 hours as needed for  pain.  4. Avoid lifting, pushing or pulling more than 5 to 10 pounds until better.   5. Avoid prolonged sitting, standing, bending, reaching or lying down until better.  6. Use moist heat as needed for 20 minutes several times daily. Can alternate with ice.  7. Do exercises as demonstrated.  Refer to handouts provided.  8. Avoid triggering activities.  9. WATCH YOUR POSTURE.  10. Return to our office for a follow up visit 2-4 weeks or sooner if symptoms worsen or persist.

## 2013-02-09 LAB — AMB POC RAPID INFLUENZA TEST: QuickVue Influenza test: NEGATIVE

## 2013-02-09 NOTE — Progress Notes (Signed)
Chief Complaint   Patient presents with   ??? Osteopathic Manipulation Treatment   ??? Cold Symptoms     Patient presents with generalized body aches and feeling warm with  low grade temp of 99.4 Symptoms started on yesterday     1. Have you been to the ER, urgent care clinic since your last visit?  Hospitalized since your last visit? No  2. Have you seen or consulted any other health care providers outside of the [place] since your last visit?  Include any pap smears or colon screening. No

## 2013-02-09 NOTE — Progress Notes (Signed)
HPI    Katrina CalkinChristine M [name] is a 48 y.o. female presents to our office to discuss her back pain today.   She is also experiencing cold symptoms: generalized body aches, fevers that started yesterday morning when she woke up. She rested all day yesterday and was feeling slightly better, until she woke up feeling the same way again today. She denies any coughing. She admits she has been exposed to a lot of people with similar symptoms, and people who have been diagnosed with the flu.     Katrina [name] has been experiencing left hip pain since she ran in the marathon on 11/22/12. The pain is improving, however.  She states that occasionally, she will feel a pulling in her left IT band distribution.  She has been to PT in the past for tight hip flexors.   She has some tightness in her thoracic spine and lumbar spine,and she feels that she has decreased mobility. She is very active, and still does some running, piliates, yoga, arc training, elliptical and bike training and some weight training.   Since her last OMT session, she reports: she was sore after the treatment. However, she did notice a big improvement with her overall stiffness has improved. She is currently doing the 10 K training, and is still running. While she was running a few days ago, she started to have some left lateral knee pain, and some left lateral hip pain. This area kept getting tighter and tighter as she continued to run. She did walk the final mile.     She denies any weakness of her left leg, or numbness, or tingling, saddle esthesia, urinary/bowel retention or incontinence.   Sitting for long periods of times makes the effected area of her buttocks very stiff, as well as prolonged standing. Moving around does alleviate her pain.         Agree with nurse history.     Current Outpatient Prescriptions   Medication Sig Dispense Refill   ??? nystatin-triamcinolone (MYCOLOG) 100,000-0.1 unit/gram-% ointment as needed.       ??? cyclobenzaprine  (FLEXERIL) 10 mg tablet 10 mg as needed.       ??? atorvastatin (LIPITOR) 40 mg tablet Take 0.5 tablets by mouth daily.  90 tablet  3   ??? levothyroxine (LEVOXYL) 88 mcg tablet Take 1 Tab by mouth daily.  90 Tab  3   ??? cholecalciferol, vitamin D3, (VITAMIN D3) 2,000 unit Tab Take 2,000 Units by mouth.       ??? b complex vitamins tablet Take 1 Tab by mouth daily.         ??? ascorbic acid (VITAMIN C) 500 mg tablet Take  by mouth.       ??? vitamin E (AQUA GEMS) 400 unit capsule Take 400 Units by mouth two (2) times a day.         Past Medical History   Diagnosis Date   ??? S/P tonsillectomy 07/25/2007   ??? S/P appendectomy 07/25/2007   ??? Obesity 07/25/2007   ??? Hyperlipidemia 07/25/2007   ??? Fatty liver 07/25/2007     In NASH study at [facility] (Dr. Kathleen ArgueVelimar Luketic)   ??? Elevated triglycerides with high cholesterol 07/25/2007   ??? Depression 07/25/2007   ??? GE reflux 07/25/2007     Has improved with weight loss (has GI at [facility])   ??? HTN 07/25/2007   ??? Hypothyroidism 07/25/2007   ??? Pap smear for cervical cancer screening 08/02/10     Angelena SoleMary Cobel (  [facility])   ??? Hx of mammogram 7/12     MRI, 6 mo   ??? Adrenal adenoma 12/12/2009     Saw Nephrologist for this    ??? Chest pain, unspecified 12/12/2009   ??? Diabetes mellitus, type 2    ??? Osteoarthritis of lumbar spine      MRI 12/14   ??? Osteoarthritis of thoracic spine      MRI 12/14         ROS  Katrina [name] denies headache, vision changes, SOB, chest pain or tightness, abdominal pain, fecal or new urinary incontinence, weakness, gait change, numbness, tingling, swelling, and skin changes.  ROS is otherwise negative.    PHYSICAL EXAMINATION  BP 150/84   Pulse 77   Temp(Src) 99.4 ??F (37.4 ??C) (Oral)   Resp 16   Ht 5\' 5"  (1.651 m)   Wt 185 lb 14.4 oz (84.324 kg)   BMI 30.94 kg/m2   SpO2 98%   LMP 02/01/2012    General appearance - Well nourished. Well appearing.  Well developed.  Mild discomfort noted with position change. Overweight.  Cooperative.  Difficulty relaxing.  Head -  normocephalic.  Atraumatic. Increased occipital muscle tension noted.    HEENT: NC/AT, PERRL Normal oral pharynx, no exudate, or erythema noted.  Chest: Clear to auscultation bilaterally.   Heart: RRR without murmers  Heme/Lymph - peripheral pulses normal x 4 extremities.  No peripheral edema is noted.  Skin - no rashes, erythema, ecchymosis, lacerations, abrasions  Back exam - limited range of motion in rotation bilaterally.  Increased muscle tension in the paravertebral musculature in the thoracic, lumbar and sacral regions.  No pain on palpation of the spinous processes in the cervical, thoracic, lumbar, sacral regions.  No CVA tenderness.  Pain noted with palpation to paraspinal muscles of the thoracic and lumbar spine with associated muscle spasm.  Pelvis -  Positive Standing Flexion Test on the left.  Negative Seated Flexion Test. No pain on palpation of the pelvis, pubes.  ASIS is higher on the left.  PSIS is higher on the left. Pelvic rotation to the right noted.    Sacrum - Decreased Spring.  Sacrum is rotated to the left.  Neurological - awake, alert and oriented to person, place, and time and event.  Cranial nerves II through XII intact  Normal speech.  No focal findings.  Muscle strength is +5/5 x 4 extremities.  Sensation is intact to light touch bilaterally.  Steady gait.  Negative Straight Leg Test bilaterally.    Musculoskeletal - Intact x 4 extremities.  Full ROM x 4 extremities.  No pain with movement.  No pain on palpation of the bilateral shoulders, elbows, wrists, hands.  No tenderness in the pelvis, pubic bone, bilateral hips, knees, ankles.  No obvious deformity or swelling.  Tight hamstrings and IT bands noted bilaterally. Spasming left Piriformis Muscle    Rapid Flu test Negative.     ASSESSMENT/PLAN  1. Viral URI   Encouraged rest, hydration, and plenty of hand washing. Encouraged her to watch for worsening symptoms or signs of respiratory distress.     OMT Procedure was discussed with the  patient. Verbal consent was received.  Osteopathic Manipulation Therapy was performed during this visit.   Procedure Note:   Muscle Energy: Location:   lumbars: Result:   good release and increased symmetry and ROM after treatment, pelvis: Result:   good release and increased symmetry and ROM after treatment and lower extremities:  Result:   good release and increased symmetry and ROM after treatment  Myofascial Releases: Location:   thoracics: Result:   good release and increased symmetry after treatment, lumbars: Result: good release and increased symmetry after treatment and sacral: Result: good release and increased symmetry after treatment  Counterstrain: Location:   lower extremities: Result:  50% improvment after treatment  Pubic Release: Result: reset pelvis and sacrum  Myofascial Stretching : Result:  decreased muscle tension  Lumbar Roll  lumbars: Result:   good release and increased symmetry and ROM after treatment  Sacral Compression: Result: increase sacral motility  Advised patient:   1. Do not self adjust.  2. Increase water intake.  3. May be more uncomfortable the first 24-48 hours after receiving OMT. Use OTC Tylenol Arthritis or Ibuprofen up to 800mg  with food every 8 hours as needed for  pain.  4. Avoid lifting, pushing or pulling more than 5 to 10 pounds until better.   5. Avoid prolonged sitting, standing, bending, reaching or lying down until better.  6. Use moist heat as needed for 20 minutes several times daily. Can alternate with ice.  7. Do exercises as demonstrated.  Refer to handouts provided.  8. Avoid triggering activities.  9. WATCH YOUR POSTURE.  10. Return to our office for a follow up visit 2-4  weeks or sooner if symptoms worsen or persist.  Patient expresses understanding of treatment plan and agrees with recommendations.      Brain Hilts, DO

## 2013-02-23 NOTE — Patient Instructions (Signed)
Advised patient:   1. Do not self adjust.  2. Increase water intake.  3. May be more uncomfortable the first 24-48 hours after receiving OMT. Use OTC Tylenol Arthritis or Ibuprofen up to 800mg with food every 8 hours as needed for  pain.  4. Avoid lifting, pushing or pulling more than 5 to 10 pounds until better.   5. Avoid prolonged sitting, standing, bending, reaching or lying down until better.  6. Use moist heat as needed for 20 minutes several times daily. Can alternate with ice.  7. Do exercises as demonstrated.  Refer to handouts provided.  8. Avoid triggering activities.  9. WATCH YOUR POSTURE.  10. Return to our office for a follow up visit 2-4  weeks or sooner if symptoms worsen or persist.

## 2013-02-23 NOTE — Progress Notes (Signed)
Chief Complaint   Patient presents with   ??? Osteopathic Manipulation Treatment   ??? Hip Pain     left hip pain last few days. Patient states it is a burning sensation intermittently   ??? Back Pain     Patient states she has stiffness in the thoracic spine radiating up to bilateral shoulders for the past week.     1. Have you been to the ER, urgent care clinic since your last visit?  Hospitalized since your last visit? No    2. Have you seen or consulted any other health care providers outside of the [place] since your last visit?  Include any pap smears or colon screening. No

## 2013-02-23 NOTE — Progress Notes (Signed)
HPI    Katrina [name] is a 48 y.o. female presents to our office to discuss her back pain today.       Katrina [name] has been experiencing left hip pain since she ran in the marathon on 11/22/12. The pain is improving, however.  She states that occasionally, she will feel a pulling in her left IT band distribution.  She has been to PT in the past for tight hip flexors.   She has some tightness in her thoracic spine and lumbar spine,and she feels that she has decreased mobility. She is very active, and still does some running, piliates, yoga, arc training, elliptical and bike training and some weight training.   She will have some periods of burning over her left lateral hip after some of her running work outs. This morning, she had a pulling on the inside of her leg and into her groin she had a pulling sensation/burning sensation. She has never had her form evaluated. She also has had some tightness and stiffness in her upper back and neck.     Since her last OMT session, she reports: she was sore after the treatment. However, she did notice a big improvement with her overall stiffness has improved.     She denies any weakness of her left leg, or numbness, or tingling, saddle esthesia, urinary/bowel retention or incontinence.   Sitting for long periods of times makes the effected area of her buttocks very stiff, as well as prolonged standing. Moving around does alleviate her pain.         Agree with nurse history.     Current Outpatient Prescriptions   Medication Sig Dispense Refill   ??? nystatin-triamcinolone (MYCOLOG) 100,000-0.1 unit/gram-% ointment as needed.       ??? cyclobenzaprine (FLEXERIL) 10 mg tablet 10 mg as needed.       ??? atorvastatin (LIPITOR) 40 mg tablet Take 0.5 tablets by mouth daily.  90 tablet  3   ??? levothyroxine (LEVOXYL) 88 mcg tablet Take 1 Tab by mouth daily.  90 Tab  3   ??? cholecalciferol, vitamin D3, (VITAMIN D3) 2,000 unit Tab Take 2,000 Units by mouth.       ??? b complex vitamins  tablet Take 1 Tab by mouth daily.         ??? ascorbic acid (VITAMIN C) 500 mg tablet Take  by mouth.       ??? vitamin E (AQUA GEMS) 400 unit capsule Take 400 Units by mouth two (2) times a day.         Past Medical History   Diagnosis Date   ??? S/P tonsillectomy 07/25/2007   ??? S/P appendectomy 07/25/2007   ??? Obesity 07/25/2007   ??? Hyperlipidemia 07/25/2007   ??? Fatty liver 07/25/2007     In NASH study at [facility] (Dr. Hilary Hertz)   ??? Elevated triglycerides with high cholesterol 07/25/2007   ??? Depression 07/25/2007   ??? GE reflux 07/25/2007     Has improved with weight loss (has GI at [facility])   ??? HTN 07/25/2007   ??? Hypothyroidism 07/25/2007   ??? Pap smear for cervical cancer screening 08/02/10     [name])   ??? Hx of mammogram 7/12     MRI, 6 mo   ??? Adrenal adenoma 12/12/2009     Saw Nephrologist for this    ??? Chest pain, unspecified 12/12/2009   ??? Diabetes mellitus, type 2    ??? Osteoarthritis  of lumbar spine      MRI 12/14   ??? Osteoarthritis of thoracic spine      MRI 12/14         ROS  Katrina [name] denies headache, vision changes, SOB, chest pain or tightness, abdominal pain, fecal or new urinary incontinence, weakness, gait change, numbness, tingling, swelling, and skin changes.  ROS is otherwise negative.    PHYSICAL EXAMINATION  BP 147/86    Pulse 85    Temp(Src) 98.1 ??F (36.7 ??C) (Oral)    Resp 16    Ht 5\' 5"  (1.651 m)    Wt 186 lb 3.2 oz (84.46 kg)    BMI 30.99 kg/m2      SpO2 96%    LMP 02/01/2012       General appearance - Well nourished. Well appearing.  Well developed.  Mild discomfort noted with position change. Overweight.  Cooperative.  Difficulty relaxing.  Head - normocephalic.  Atraumatic. Increased occipital muscle tension noted.    Heme/Lymph - peripheral pulses normal x 4 extremities.  No peripheral edema is noted.  Skin - no rashes, erythema, ecchymosis, lacerations, abrasions  Chest - Elevated first rib on the right.   Back exam - limited range of motion in rotation, with  increased tightness appreciated in flexion.  Increased muscle tension in the paravertebral musculature in the occipital, thoracic, lumbar and sacral regions.  No pain on palpation of the spinous processes in the cervical, thoracic, lumbar, sacral regions.  No CVA tenderness.  Pain noted with palpation to paraspinal muscles of the upper thoracic and lumbar spine with associated muscle spasm.  Pelvis -  Positive Standing Flexion Test on the left.  Negative Seated Flexion Test. No pain on palpation of the pelvis, pubes.  ASIS is higher on the left.  PSIS is higher on the left. Pelvic rotation to the right noted.    Sacrum - Decreased Spring.  Sacrum is rotated to the left.  Neurological - awake, alert and oriented to person, place, and time and event.  Cranial nerves II through XII intact  Normal speech.  No focal findings.  Muscle strength is +5/5 x 4 extremities.  Sensation is intact to light touch bilaterally.  Steady gait.  Negative Straight Leg Test bilaterally.    Musculoskeletal - Intact x 4 extremities.  Full ROM x 4 extremities.  No pain with movement.  No pain on palpation of the bilateral shoulders, elbows, wrists, hands.  No tenderness in the pelvis, pubic bone, bilateral hips, knees, ankles.  No obvious deformity or swelling.  Tight hamstrings, iliopsoas muscles, and IT bands noted bilaterally. Spasming left Piriformis Muscle        ASSESSMENT/PLAN  OMT Procedure was discussed with the patient. Verbal consent was received.  Osteopathic Manipulation Therapy was performed during this visit.   Procedure Note:   Muscle Energy: Location:   lumbars: Result:   good release and increased symmetry and ROM after treatment, pelvis: Result:   good release and increased symmetry and ROM after treatment and lower extremities: Result:   good release and increased symmetry and ROM after treatment, and Cervicals: Result:   good release and increased symmetry and ROM after treatment  Myofascial Releases: Location:    thoracics: Result:   good release and increased symmetry after treatment, lumbars: Result: good release and increased symmetry after treatment and sacral: Result: good release and increased symmetry after treatment  Counterstrain: Location:   lower extremities: Result:  More than 50% improvment after treatment  Pubic Release: Result: reset pelvis and sacrum  Myofascial Stretching : Result:  decreased muscle tension  Lumbar Roll  lumbars: Result:   good release and increased symmetry and ROM after treatment  Sacral Compression: Result: increase sacral motility  Direct pressure on left elevated first rib  Articulatory release on bilateral upper thoracic regions, Results: improved ROM and symmetry after the treatment.   Advised patient:   1. Do not self adjust.  2. Increase water intake.  3. May be more uncomfortable the first 24-48 hours after receiving OMT. Use OTC Tylenol Arthritis or Ibuprofen up to 800mg  with food every 8 hours as needed for  pain.  4. Avoid lifting, pushing or pulling more than 5 to 10 pounds until better.   5. Avoid prolonged sitting, standing, bending, reaching or lying down until better.  6. Use moist heat as needed for 20 minutes several times daily. Can alternate with ice.  7. Do exercises as demonstrated.  Refer to handouts provided.  8. Avoid triggering activities.  9. WATCH YOUR POSTURE.  10. Return to our office for a follow up visit 2-4  weeks or sooner if symptoms worsen or persist.  Patient expresses understanding of treatment plan and agrees with recommendations.      Brain Hilts, DO

## 2013-03-10 NOTE — Progress Notes (Signed)
Chief Complaint   Patient presents with   ??? Osteopathic Manipulation Treatment     upper back and both hips     1. Have you been to the ER, urgent care clinic since your last visit?  Hospitalized since your last visit? no    2. Have you seen or consulted any other health care providers outside of the [place] since your last visit?  Include any pap smears or colon screening. no

## 2013-03-10 NOTE — Patient Instructions (Signed)
Advised patient:   1. Do not self adjust.  2. Increase water intake.  3. May be more uncomfortable the first 24-48 hours after receiving OMT. Use OTC Tylenol Arthritis or Ibuprofen up to 800mg with food every 8 hours as needed for  pain.  4. Avoid lifting, pushing or pulling more than 5 to 10 pounds until better.   5. Avoid prolonged sitting, standing, bending, reaching or lying down until better.  6. Use moist heat as needed for 20 minutes several times daily. Can alternate with ice.  7. Do exercises as demonstrated.  Refer to handouts provided.  8. Avoid triggering activities.  9. WATCH YOUR POSTURE.  10. Return to our office for a follow up visit 2-4  weeks or sooner if symptoms worsen or persist.

## 2013-03-10 NOTE — Progress Notes (Signed)
HPI    Katrina CalkinChristine M [name] is a 48 y.o. female presents to our office to discuss her back pain today.       [name] has been experiencing left hip pain since she ran in the marathon on 11/22/12. The pain is improving, however.    She states that occasionally, she will feel a pulling in her left IT band distribution.  She has been to PT in the past for tight hip flexors.   She has some tightness in her thoracic spine and lumbar spine,and she feels that she has decreased mobility. She is very active, and still does some running, piliates, yoga, arc training, elliptical and bike training and some weight training. Her Most recent MRIs were in 12/2012 for both thoracic and Lumbar Spine. They revealed some mild arthritis, but were otherwise normal.     She will have some periods of burning over her left lateral hip after some of her running work outs.  She has never had her form evaluated. She also has had some tightness and stiffness in her upper back and neck.     Since her last OMT session, she reports: she was sore after the treatment. However, she did notice a big improvement with her overall stiffness has improved. She reports the last visit was great. She has noticed an improvement in her range of motion. She is still running and training for an upcoming 10K. She did run last Saturday (5 miles). She did not notice that her legs felt as heavy, and she is no longer getting the "pull" in the back of her legs as much as she was prior to her treatments.     She denies any weakness of her left leg, or numbness, or tingling, saddle esthesia, urinary/bowel retention or incontinence.   Sitting for long periods of times makes the effected area of her buttocks very stiff, as well as prolonged standing. Moving around does alleviate her pain.         Agree with nurse history.     Current Outpatient Prescriptions   Medication Sig Dispense Refill   ??? nystatin-triamcinolone (MYCOLOG) 100,000-0.1 unit/gram-% ointment as  needed.       ??? cyclobenzaprine (FLEXERIL) 10 mg tablet 10 mg as needed.       ??? atorvastatin (LIPITOR) 40 mg tablet Take 0.5 tablets by mouth daily.  90 tablet  3   ??? levothyroxine (LEVOXYL) 88 mcg tablet Take 1 Tab by mouth daily.  90 Tab  3   ??? cholecalciferol, vitamin D3, (VITAMIN D3) 2,000 unit Tab Take 2,000 Units by mouth.       ??? b complex vitamins tablet Take 1 Tab by mouth daily.         ??? ascorbic acid (VITAMIN C) 500 mg tablet Take  by mouth.       ??? vitamin E (AQUA GEMS) 400 unit capsule Take 400 Units by mouth two (2) times a day.         Past Medical History   Diagnosis Date   ??? S/P tonsillectomy 07/25/2007   ??? S/P appendectomy 07/25/2007   ??? Obesity 07/25/2007     Improved with Lifestyle Modifications   ??? Hyperlipidemia 07/25/2007     Improving with weight loss   ??? Fatty liver 07/25/2007     In NASH study at [facility] (Dr. Kathleen ArgueVelimar Luketic)   ??? Elevated triglycerides with high cholesterol 07/25/2007   ??? Depression 07/25/2007   ??? GE reflux 07/25/2007  Has improved with weight loss (has GI at [facility])   ??? HTN 07/25/2007     Well controlled with diet and exercise   ??? Hypothyroidism 07/25/2007     Last Check 10/2012, TSH 0.89   ??? Pap smear for cervical cancer screening 08/02/10     [name])   ??? Hx of mammogram 7/12     MRI, 6 mo   ??? Adrenal adenoma 12/12/2009     Saw Nephrologist for this    ??? Chest pain, unspecified 12/12/2009     Was previously established with Dr. Sinclair Ship   ??? Diabetes mellitus, type 2      Currently diet and exericse controlled, last A1c 5.2 (10/2012)   ??? Osteoarthritis of lumbar spine      MRI 12/14   ??? Osteoarthritis of thoracic spine      MRI 12/14, Stable         ROS  Katrina [name] denies headache, vision changes, SOB, chest pain or tightness, abdominal pain, fecal or new urinary incontinence, weakness, gait change, numbness, tingling, swelling, and skin changes.  ROS is otherwise negative.    PHYSICAL EXAMINATION  BP 130/81    Pulse 76    Temp(Src) 98.1 ??F  (36.7 ??C) (Oral)    Resp 16    Ht 5\' 5"  (1.651 m)    Wt 186 lb 6.4 oz (84.55 kg)    BMI 31.02 kg/m2      SpO2 98%    LMP 03/03/2013       General appearance - Well nourished. Well appearing.  Well developed.  Mild discomfort noted with position change. Overweight.  Cooperative.  Difficulty relaxing.  Head - normocephalic.  Atraumatic. Increased occipital muscle tension noted.    Heme/Lymph - peripheral pulses normal x 4 extremities.  No peripheral edema is noted.  Skin - no rashes, erythema, ecchymosis, lacerations, abrasions  Chest - Elevated first rib on the right.   Back exam - limited range of motion in rotation, with increased tightness appreciated in flexion.  Increased muscle tension in the paravertebral musculature in the occipital, thoracic, lumbar and sacral regions.  No pain on palpation of the spinous processes in the cervical, thoracic, lumbar, sacral regions.  No CVA tenderness.  Pain noted with palpation to paraspinal muscles of the cervical, upper thoracic, and lumbar spine with associated muscle spasm.  Pelvis -  Positive Standing Flexion Test on the left.  Negative Seated Flexion Test. No pain on palpation of the pelvis, pubes.  ASIS is higher on the left.  PSIS is higher on the right. Pelvic rotation to the right noted.  (Left Posterior Innominate)  Sacrum - Decreased Spring.  Sacrum is rotated to the left.  Neurological - awake, alert and oriented to person, place, and time and event.  Cranial nerves II through XII intact  Normal speech.  No focal findings.  Muscle strength is +5/5 x 4 extremities.  Sensation is intact to light touch bilaterally.  Steady gait.  Negative Straight Leg Test bilaterally.    Musculoskeletal - Intact x 4 extremities.  Full ROM x 4 extremities.  No pain with movement.  No pain on palpation of the bilateral shoulders, elbows, wrists, hands.  No tenderness in the pelvis, pubic bone, bilateral hips, knees, ankles.  No obvious deformity or swelling.  Tight hamstrings,  iliopsoas muscles, and IT bands noted bilaterally. Spasming bilateral Piriformis Muscles. Decreased bilateral scapula movement.  ASSESSMENT/PLAN  OMT Procedure was discussed with the patient. Verbal consent was received.  Osteopathic Manipulation Therapy was performed during this visit.   Procedure Note:   Muscle Energy: Location:   lumbars: Result:   good release and increased symmetry and ROM after treatment, pelvis: Result:   good release and increased symmetry and ROM after treatment and lower extremities: Result:   good release and increased symmetry and ROM after treatment, and Cervicals: Result:   good release and increased symmetry and ROM after treatment  Myofascial Releases: Location:   thoracics: Result:   good release and increased symmetry after treatment, lumbars: Result: good release and increased symmetry after treatment and sacral: Result: good release and increased symmetry after treatment. Scapula: Result: good release and increased symmetry after treatment  Counterstrain: Location:   Bilateral Piriformis - lower extremities: Result:  More than 50% improvment after treatment  Pubic Release: Result: reset pelvis and sacrum  Myofascial Stretching : Result:  decreased muscle tension  Lumbar Roll  lumbars: Result:   good release and increased symmetry and ROM after treatment  Sacral Compression: Result: increase sacral motility  Direct pressure on Right elevated first rib  Articulatory release on bilateral upper thoracic regions, Results: improved ROM and symmetry after the treatment.   Advised patient:   1. Do not self adjust.  2. Increase water intake.  3. May be more uncomfortable the first 24-48 hours after receiving OMT. Use OTC Tylenol Arthritis or Ibuprofen up to 800mg  with food every 8 hours as needed for  pain.  4. Avoid lifting, pushing or pulling more than 5 to 10 pounds until better.   5. Avoid prolonged sitting, standing, bending, reaching or lying down until better.  6. Use moist  heat as needed for 20 minutes several times daily. Can alternate with ice.  7. Do exercises as demonstrated.  Refer to handouts provided.  8. Avoid triggering activities.  9. WATCH YOUR POSTURE.  10. Return to our office for a follow up visit 2-4  weeks or sooner if symptoms worsen or persist.  Patient expresses understanding of treatment plan and agrees with recommendations.      Brain Hilts, DO

## 2013-03-18 ENCOUNTER — Encounter

## 2013-03-18 MED ORDER — LEVOTHYROXINE 88 MCG TAB
88 mcg | ORAL_TABLET | Freq: Every day | ORAL | Status: DC
Start: 2013-03-18 — End: 2013-04-14

## 2013-03-18 NOTE — Telephone Encounter (Signed)
From: Katrina [name]  To: Bedelia Person, MD  Sent: 03/18/2013 11:02 AM EDT  Subject: Medication Renewal Request    Original authorizing provider: Bedelia Person, MD    Katrina [name] would like a refill of the following medications:  levothyroxine (LEVOXYL) 88 mcg tablet Bedelia Person, MD]    Preferred pharmacy: [facility]    Comment:

## 2013-03-18 NOTE — Telephone Encounter (Signed)
3 mo filled in Dr. Azzie Roup absence.

## 2013-03-25 NOTE — Progress Notes (Signed)
Chief Complaint   Patient presents with   ??? Osteopathic Manipulation Treatment      shoulders and hips     1. Have you been to the ER, urgent care clinic since your last visit?  Hospitalized since your last visit? no    2. Have you seen or consulted any other health care providers outside of the [place] since your last visit?  Include any pap smears or colon screening. no

## 2013-03-25 NOTE — Patient Instructions (Signed)
Advised patient:   1. Do not self adjust.  2. Increase water intake.  3. May be more uncomfortable the first 24-48 hours after receiving OMT. Use OTC Tylenol Arthritis or Ibuprofen up to 800mg with food every 8 hours as needed for  pain.  4. Avoid lifting, pushing or pulling more than 5 to 10 pounds until better.   5. Avoid prolonged sitting, standing, bending, reaching or lying down until better.  6. Use moist heat as needed for 20 minutes several times daily. Can alternate with ice.  7. Do exercises as demonstrated.  Refer to handouts provided.  8. Avoid triggering activities.  9. WATCH YOUR POSTURE.  10. Return to our office for a follow up visit 2-4  weeks or sooner if symptoms worsen or persist.

## 2013-03-25 NOTE — Progress Notes (Signed)
HPI    Katrina [name] is a 48 y.o. female presents to our office to discuss her back pain today.       Katrina [name] has been experiencing left hip pain since she ran in the marathon on 11/22/12. The pain is improving, however.    She states that occasionally, she will feel a pulling in her left IT band distribution.  She has been to PT in the past for tight hip flexors.   She has some tightness in her thoracic spine and lumbar spine,and she feels that she has decreased mobility. She is very active, and still does some running, piliates, yoga, arc training, elliptical and bike training and some weight training. Her Most recent MRIs were in 12/2012 for both thoracic and Lumbar Spine. They revealed some mild arthritis, but were otherwise normal.     She will have some periods of burning over her left lateral hip after some of her running work outs.  She has never had her form evaluated. She also has had some tightness and stiffness in her upper back and neck.     Since her last OMT session, she reports: she was sore after the treatment. However, she did notice a big improvement with her overall stiffness has improved. She reports the last visit was great. She has noticed an improvement in her range of motion. She is still running and training for an upcoming 10K. She reports she felt great when she did her last couple of long runs. She ran 10 miles, and then a 4 mile run recently. She is still doing Pilates, and is finding that she is more flexible as well.     She denies any weakness of her left leg, or numbness, or tingling, saddle esthesia, urinary/bowel retention or incontinence.   Sitting for long periods of times makes the effected area of her buttocks very stiff, as well as prolonged standing. Moving around does alleviate her pain.         Agree with nurse history.     Current Outpatient Prescriptions   Medication Sig Dispense Refill   ??? nystatin-triamcinolone (MYCOLOG) 100,000-0.1 unit/gram-% ointment as  needed.       ??? cyclobenzaprine (FLEXERIL) 10 mg tablet 10 mg as needed.       ??? atorvastatin (LIPITOR) 40 mg tablet Take 0.5 tablets by mouth daily.  90 tablet  3   ??? levothyroxine (LEVOXYL) 88 mcg tablet Take 1 Tab by mouth daily.  90 Tab  3   ??? cholecalciferol, vitamin D3, (VITAMIN D3) 2,000 unit Tab Take 2,000 Units by mouth.       ??? b complex vitamins tablet Take 1 Tab by mouth daily.         ??? ascorbic acid (VITAMIN C) 500 mg tablet Take  by mouth.       ??? vitamin E (AQUA GEMS) 400 unit capsule Take 400 Units by mouth two (2) times a day.         Past Medical History   Diagnosis Date   ??? S/P tonsillectomy 07/25/2007   ??? S/P appendectomy 07/25/2007   ??? Obesity 07/25/2007     Improved with Lifestyle Modifications   ??? Hyperlipidemia 07/25/2007     Improving with weight loss   ??? Fatty liver 07/25/2007     In NASH study at [facility] (Dr. Hilary Hertz)   ??? Elevated triglycerides with high cholesterol 07/25/2007   ??? Depression 07/25/2007   ??? GE reflux 07/25/2007  Has improved with weight loss (has GI at [facility])   ??? HTN 07/25/2007     Well controlled with diet and exercise   ??? Hypothyroidism 07/25/2007     Last Check 10/2012, TSH 0.89   ??? Pap smear for cervical cancer screening 08/02/10     [name]   ??? Hx of mammogram 7/12     MRI, 6 mo   ??? Adrenal adenoma 12/12/2009     Saw Nephrologist for this    ??? Chest pain, unspecified 12/12/2009     Was previously established with Dr. Tollie EthMark Warner   ??? Diabetes mellitus, type 2      Currently diet and exericse controlled, last A1c 5.2 (10/2012)   ??? Osteoarthritis of lumbar spine      MRI 12/14   ??? Osteoarthritis of thoracic spine      MRI 12/14, Stable         ROS  Katrina CalkinChristine M [name] denies headache, vision changes, SOB, chest pain or tightness, abdominal pain, fecal or new urinary incontinence, weakness, gait change, numbness, tingling, swelling, and skin changes.  ROS is otherwise negative.    PHYSICAL EXAMINATION  BP 116/79    Pulse 80    Temp(Src) 98.5 ??F  (36.9 ??C) (Oral)    Resp 18    Ht 5\' 5"  (1.651 m)    Wt 186 lb 12.8 oz (84.732 kg)    BMI 31.09 kg/m2      SpO2 96%    LMP 03/03/2013       General appearance - Well nourished. Well appearing.  Well developed.  Mild discomfort noted with position change. Overweight.  Cooperative.  Difficulty relaxing.  Head - normocephalic.  Atraumatic. Increased occipital muscle tension noted.    Heme/Lymph - peripheral pulses normal x 4 extremities.  No peripheral edema is noted.  Skin - no rashes, erythema, ecchymosis, lacerations, abrasions  Chest - Elevated first rib on the right.   Back exam - Full range of motion, with increased tightness appreciated in flexion.  Increased muscle tension in the paravertebral musculature in the occipital, thoracic, lumbar and sacral regions.  No pain on palpation of the spinous processes in the cervical, thoracic, lumbar, sacral regions.  No CVA tenderness.  Pain noted with palpation to paraspinal muscles of the cervical, upper thoracic, and lumbar spine with associated muscle spasm.  Pelvis -  Positive Standing Flexion Test on the left.  Negative Seated Flexion Test. No pain on palpation of the pelvis, pubes.  ASIS is higher on the left.  PSIS is higher on the right. Pelvic rotation to the right noted.  (Left Posterior Innominate)  Sacrum - Decreased Spring.  Sacrum is rotated to the left.  Neurological - awake, alert and oriented to person, place, and time and event.  Cranial nerves II through XII intact  Normal speech.  No focal findings.  Muscle strength is +5/5 x 4 extremities.  Sensation is intact to light touch bilaterally.  Steady gait.  Negative Straight Leg Test bilaterally.    Musculoskeletal - Intact x 4 extremities.  Full ROM x 4 extremities.  No pain with movement.  No pain on palpation of the bilateral shoulders, elbows, wrists, hands.  No tenderness in the pelvis, pubic bone, bilateral hips, knees, ankles.  No obvious deformity or swelling.  Tight hamstrings, iliopsoas muscles,  and IT bands noted bilaterally. Spasming left Piriformis Muscles. Decreased bilateral scapula movement.         ASSESSMENT/PLAN  OMT Procedure was discussed with the patient. Verbal consent was received.  Osteopathic Manipulation Therapy was performed during this visit.   Procedure Note:   Muscle Energy: Location:   lumbars: Result:   good release and increased symmetry and ROM after treatment, pelvis: Result:   good release and increased symmetry and ROM after treatment and lower extremities: Result:   good release and increased symmetry and ROM after treatment, and Cervicals: Result:   good release and increased symmetry and ROM after treatment  Myofascial Releases: Location:   thoracics: Result:   good release and increased symmetry after treatment, lumbars: Result: good release and increased symmetry after treatment and sacral: Result: good release and increased symmetry after treatment. Scapula: Result: good release and increased symmetry after treatment  Counterstrain: Location:   Left Piriformis - lower extremities: Result:  More than 50% improvment after treatment  Pubic Release: Result: reset pelvis and sacrum  Myofascial Stretching : Result:  decreased muscle tension  Lumbar Roll  lumbars: Result:   good release and increased symmetry and ROM after treatment  Sacral Compression: Result: increase sacral motility  Direct pressure on Right elevated first rib  Rib Raising, Result: Increased movement of rib cage bilaterally  Articulatory release on bilateral upper thoracic regions, Results: improved ROM and symmetry after the treatment.   Advised patient:   1. Do not self adjust.  2. Increase water intake.  3. May be more uncomfortable the first 24-48 hours after receiving OMT. Use OTC Tylenol Arthritis or Ibuprofen up to 800mg  with food every 8 hours as needed for  pain.  4. Avoid lifting, pushing or pulling more than 5 to 10 pounds until better.   5. Avoid prolonged sitting, standing, bending, reaching or  lying down until better.  6. Use moist heat as needed for 20 minutes several times daily. Can alternate with ice.  7. Do exercises as demonstrated.  Refer to handouts provided.  8. Avoid triggering activities.  9. WATCH YOUR POSTURE.  10. Return to our office for a follow up visit 2-4  weeks or sooner if symptoms worsen or persist.  Patient expresses understanding of treatment plan and agrees with recommendations.      Brain Hilts, DO

## 2013-04-13 MED ORDER — LORAZEPAM 0.5 MG TAB
0.5 mg | ORAL_TABLET | Freq: Three times a day (TID) | ORAL | Status: DC | PRN
Start: 2013-04-13 — End: 2013-05-06

## 2013-04-13 NOTE — Patient Instructions (Signed)
Advised patient:   1. Do not self adjust.  2. Increase water intake.  3. May be more uncomfortable the first 24-48 hours after receiving OMT. Use OTC Tylenol Arthritis or Ibuprofen up to 800mg with food every 8 hours as needed for  pain.  4. Avoid lifting, pushing or pulling more than 5 to 10 pounds until better.   5. Avoid prolonged sitting, standing, bending, reaching or lying down until better.  6. Use moist heat as needed for 20 minutes several times daily. Can alternate with ice.  7. Do exercises as demonstrated.  Refer to handouts provided.  8. Avoid triggering activities.  9. WATCH YOUR POSTURE.  10. Return to our office for a follow up visit 2-4  weeks or sooner if symptoms worsen or persist.

## 2013-04-13 NOTE — Progress Notes (Signed)
Chief Complaint   Patient presents with   ??? Osteopathic Manipulation Treatment     hip and upper back     1. Have you been to the ER, urgent care clinic since your last visit?  Hospitalized since your last visit? no    2. Have you seen or consulted any other health care providers outside of the [place] since your last visit?  Include any pap smears or colon screening. NO

## 2013-04-13 NOTE — Progress Notes (Signed)
HPI    Katrina [name] is a 48 y.o. female presents to our office to discuss her back pain today.       [name] has been experiencing left hip pain since she ran in the marathon on 11/22/12. The pain is improving, however.    She states that occasionally, she will feel a pulling in her left IT band distribution.  She has been to PT in the past for tight hip flexors.   She has some tightness in her thoracic spine and lumbar spine,and she feels that she has decreased mobility. She is very active, and still does some running, piliates, yoga, arc training, elliptical and bike training and some weight training. Her Most recent MRIs were in 12/2012 for both thoracic and Lumbar Spine. They revealed some mild arthritis, but were otherwise normal.     She will have some periods of burning over her left lateral hip after some of her running work outs.  She has never had her form evaluated. She also has had some tightness and stiffness in her upper back and neck.     Since her last OMT session, she reports: she was sore after the treatment. However, she did notice a big improvement with her overall stiffness has improved. She reports the last visit was great. She has noticed an improvement in her range of motion. She recently ran the10K. She reports she felt great when she finished the race, and she did not have any pain.   She also did a post race this past weekend, and ran 4.5 miles. She has signed up for another 12K. She is still doing Pilates, and is finding that she is more flexible as well.     She denies any weakness of her left leg, or numbness, or tingling, saddle esthesia, urinary/bowel retention or incontinence.   Sitting for long periods of times makes the effected area of her buttocks very stiff, as well as prolonged standing. Moving around does alleviate her pain.     At the conclusion of our visit today, Ms. [name] becomes tearful. She states she has been under a lot of stress recently with her job,  and with her father's health. She has found it hard to motivate herself to work out, which she normally loves to do. She also finds herself more tearful. Also, she has put on some weight, which is very frustrating to her.   She would rather not take a medication daily for this stress/depression, but she has taken some expired Xanax that she had left over when the stress gets really bad.   Denies any thoughts on self harm, and feels safe at home.     Agree with nurse history.     Current Outpatient Prescriptions   Medication Sig Dispense Refill   ??? nystatin-triamcinolone (MYCOLOG) 100,000-0.1 unit/gram-% ointment as needed.       ??? cyclobenzaprine (FLEXERIL) 10 mg tablet 10 mg as needed.       ??? atorvastatin (LIPITOR) 40 mg tablet Take 0.5 tablets by mouth daily.  90 tablet  3   ??? levothyroxine (LEVOXYL) 88 mcg tablet Take 1 Tab by mouth daily.  90 Tab  3   ??? cholecalciferol, vitamin D3, (VITAMIN D3) 2,000 unit Tab Take 2,000 Units by mouth.       ??? b complex vitamins tablet Take 1 Tab by mouth daily.         ??? ascorbic acid (VITAMIN C) 500 mg tablet Take  by mouth.       ???  vitamin E (AQUA GEMS) 400 unit capsule Take 400 Units by mouth two (2) times a day.         Past Medical History   Diagnosis Date   ??? S/P tonsillectomy 07/25/2007   ??? S/P appendectomy 07/25/2007   ??? Obesity 07/25/2007     Improved with Lifestyle Modifications   ??? Hyperlipidemia 07/25/2007     Improving with weight loss   ??? Fatty liver 07/25/2007     In NASH study at [facility] (Dr. Hilary Hertz)   ??? Elevated triglycerides with high cholesterol 07/25/2007   ??? Depression 07/25/2007   ??? GE reflux 07/25/2007     Has improved with weight loss (has GI at [facility])   ??? HTN 07/25/2007     Well controlled with diet and exercise   ??? Hypothyroidism 07/25/2007     Last Check 10/2012, TSH 0.89   ??? Pap smear for cervical cancer screening 08/02/10     [name]   ??? Hx of mammogram 7/12     MRI, 6 mo   ??? Adrenal adenoma 12/12/2009     Saw Nephrologist  for this    ??? Chest pain, unspecified 12/12/2009     Was previously established with Dr. Sinclair Ship   ??? Diabetes mellitus, type 2      Currently diet and exericse controlled, last A1c 5.2 (10/2012)   ??? Osteoarthritis of lumbar spine      MRI 12/14   ??? Osteoarthritis of thoracic spine      MRI 12/14, Stable         ROS  MEGEN MADEWELL denies headache, vision changes, SOB, chest pain or tightness, abdominal pain, fecal or new urinary incontinence, weakness, gait change, numbness, tingling, swelling, and skin changes.  ROS is otherwise negative.    PHYSICAL EXAMINATION  BP 132/81    Pulse 72    Temp(Src) 98.7 ??F (37.1 ??C) (Oral)    Resp 16    Ht 5\' 5"  (1.651 m)    Wt 188 lb 8 oz (85.503 kg)    BMI 31.37 kg/m2      SpO2 99%    LMP 03/31/2013       General appearance - Well nourished. Well appearing.  Well developed.  Mild discomfort noted with position change. Overweight.  Cooperative.  Difficulty relaxing.  Head - normocephalic.  Atraumatic. Increased occipital muscle tension noted.    Heme/Lymph - peripheral pulses normal x 4 extremities.  No peripheral edema is noted.  Skin - no rashes, erythema, ecchymosis, lacerations, abrasions  Chest - Elevated first rib on the right.   Back exam - Full range of motion, with increased tightness appreciated in flexion.  Increased muscle tension in the paravertebral musculature in the occipital, thoracic, lumbar and sacral regions.  No pain on palpation of the spinous processes in the cervical, thoracic, lumbar, sacral regions.  No CVA tenderness.  Pain noted with palpation to paraspinal muscles of the cervical, upper thoracic, and lumbar spine with associated muscle spasm.  Pelvis -   Pelvic rotation to the right noted.   Sacrum - Decreased Spring.  Sacrum is rotated to the left.  Neurological - awake, alert and oriented to person, place, and time and event.  Cranial nerves II through XII intact  Normal speech.  No focal findings.  Muscle strength is +5/5 x 4 extremities.   Sensation is intact to light touch bilaterally.  Steady gait.  Negative Straight Leg Test bilaterally.  Able  to walk on heels and toes.   Musculoskeletal - Intact x 4 extremities.  Full ROM x 4 extremities.  No pain with movement.  No pain on palpation of the bilateral shoulders, elbows, wrists, hands.  No tenderness in the pelvis, pubic bone, bilateral hips, knees, ankles.  No obvious deformity or swelling.  Tight hamstrings, iliopsoas muscles, and IT bands noted bilaterally. Spasming left Piriformis Muscles. Decreased bilateral scapula movement.         ASSESSMENT/PLAN  OMT Procedure was discussed with the patient. Verbal consent was received.  Osteopathic Manipulation Therapy was performed during this visit.   Procedure Note:   Muscle Energy: Location:   lumbars: Result:   good release and increased symmetry and ROM after treatment, pelvis: Result:   good release and increased symmetry and ROM after treatment and lower extremities: Result:   good release and increased symmetry and ROM after treatment, and Cervicals: Result:   good release and increased symmetry and ROM after treatment  Myofascial Releases: Location:   thoracics: Result:   good release and increased symmetry after treatment, lumbars: Result: good release and increased symmetry after treatment and sacral: Result: good release and increased symmetry after treatment. Scapula: Result: good release and increased symmetry after treatment  Counterstrain: Location:   Left Piriformis - lower extremities: Result:  More than 50% improvment after treatment  Pubic Release: Result: reset pelvis and sacrum  Myofascial Stretching : Result:  decreased muscle tension  Lumbar Roll  lumbars: Result:   good release and increased symmetry and ROM after treatment  Sacral Compression: Result: increase sacral motility  Direct pressure on Right elevated first rib  Rib Raising, Result: Increased movement of rib cage bilaterally  Articulatory release on bilateral upper  thoracic regions, Results: improved ROM and symmetry after the treatment.   Advised patient:   1. Do not self adjust.  2. Increase water intake.  3. May be more uncomfortable the first 24-48 hours after receiving OMT. Use OTC Tylenol Arthritis or Ibuprofen up to 800mg  with food every 8 hours as needed for  pain.  4. Avoid lifting, pushing or pulling more than 5 to 10 pounds until better.   5. Avoid prolonged sitting, standing, bending, reaching or lying down until better.  6. Use moist heat as needed for 20 minutes several times daily. Can alternate with ice.  7. Do exercises as demonstrated.  Refer to handouts provided.  8. Avoid triggering activities.  9. WATCH YOUR POSTURE.  10. Return to our office for a follow up visit 2-4  weeks or sooner if symptoms worsen or persist.      For Ms. Brunke's symptoms of depression, fatigue, anhedonia, and weight gain, will perform some lab work today.   I provided support and encouragement today.   Provided refill of Anxiolytic that she can take on an as needed basis until we have her lab information back. I acknowledged that she does not want to go start medication for this, and informed her that we can come up with alternative strategies if her lab work up is normal.   Patient expresses understanding of treatment plan and agrees with recommendations.    Brain Hilts, DO

## 2013-04-14 ENCOUNTER — Encounter

## 2013-04-14 LAB — CBC W/O DIFF
HCT: 41.6 % (ref 34.0–46.6)
HGB: 13.5 g/dL (ref 11.1–15.9)
MCH: 28 pg (ref 26.6–33.0)
MCHC: 32.5 g/dL (ref 31.5–35.7)
MCV: 86 fL (ref 79–97)
PLATELET: 321 10*3/uL (ref 150–379)
RBC: 4.82 x10E6/uL (ref 3.77–5.28)
RDW: 13.3 % (ref 12.3–15.4)
WBC: 8.2 10*3/uL (ref 3.4–10.8)

## 2013-04-14 LAB — METABOLIC PANEL, BASIC
BUN/Creatinine ratio: 17 (ref 9–23)
BUN: 10 mg/dL (ref 6–24)
CO2: 20 mmol/L (ref 18–29)
Calcium: 9.7 mg/dL (ref 8.7–10.2)
Chloride: 103 mmol/L (ref 97–108)
Creatinine: 0.6 mg/dL (ref 0.57–1.00)
GFR est AA: 127 mL/min/{1.73_m2} (ref >59)
GFR est non-AA: 110 mL/min/{1.73_m2} (ref >59)
Glucose: 94 mg/dL (ref 65–99)
Potassium: 4.8 mmol/L (ref 3.5–5.2)
Sodium: 140 mmol/L (ref 134–144)

## 2013-04-14 LAB — VITAMIN D, 25 HYDROXY: VITAMIN D, 25-HYDROXY: 31.4 ng/mL (ref 30.0–100.0)

## 2013-04-14 LAB — INSULIN: Insulin: 16.7 u[IU]/mL (ref 2.6–24.9)

## 2013-04-14 LAB — TSH 3RD GENERATION: TSH: 0.557 u[IU]/mL (ref 0.450–4.500)

## 2013-04-14 MED ORDER — ERGOCALCIFEROL (VITAMIN D2) 50,000 UNIT CAP
1250 mcg (50,000 unit) | ORAL_CAPSULE | ORAL | Status: DC
Start: 2013-04-14 — End: 2013-05-26

## 2013-04-14 MED ORDER — LEVOTHYROXINE 75 MCG TAB
75 mcg | ORAL_TABLET | Freq: Every day | ORAL | Status: DC
Start: 2013-04-14 — End: 2013-06-15

## 2013-04-14 NOTE — Progress Notes (Signed)
Quick Note:    Called and informed Katrina [name] about her results. Due to her borderline Vitamin D, I would like to give her a treatment dose of Vitamin D. I have asked her to hold her OTC while she takes the treatment dose. Also, it appears that we may be suppressing her thyroid too much. Will decrease synthroid dose at this time. Will follow up in 4-6 weeks for re-evaluation of symptoms and lab work. Provided encouragement to the patient today. She demonstrated understanding and agreed with the treatment plan. Informed her that the Insulin levels are still pending.  ______

## 2013-05-04 NOTE — Patient Instructions (Signed)
Advised patient:   1. Do not self adjust.  2. Increase water intake.  3. May be more uncomfortable the first 24-48 hours after receiving OMT. Use OTC Tylenol Arthritis or Ibuprofen up to 800mg  with food every 8 hours as needed for  pain.  4. Avoid lifting, pushing or pulling more than 5 to 10 pounds until better.   5. Avoid prolonged sitting, standing, bending, reaching or lying down until better.  6. Use moist heat as needed for 20 minutes several times daily. Can alternate with ice.  7. Do exercises as demonstrated.  Refer to handouts provided.  8. Avoid triggering activities.  9. WATCH YOUR POSTURE.  Return to our office for a follow up visit 2-4  weeks or sooner if symptoms worsen or persist.    Relaxation Techniques:     How can you care for yourself at home?  ?? Take your medicine exactly as directed. Call your doctor if you think you are having a problem with your medicine.  ?? Recognize and accept your anxiety. Then, when you are in a situation that makes you anxious, say to yourself, "This is not an emergency. I feel uncomfortable, but I am not in danger. I can keep going even if I feel anxious."  ?? Be kind to your body:  ?? Relieve tension with exercise or a massage.  ?? Get enough rest.  ?? Avoid alcohol, caffeine, nicotine, and illegal drugs. They can increase your anxiety level, cause sleep problems, or trigger a panic attack.  ?? Learn and do relaxation techniques. See below for more about these techniques.  ?? Engage your mind. Get out and do something you enjoy. Go to a funny movie, or take a walk or hike. Plan your day. Having too much or too little to do can make you anxious.  ?? Keep a record of your symptoms. Discuss your fears with a good friend or family member, or join a support group for people with similar problems. Talking to others sometimes relieves stress.  ?? Get involved in social groups, or volunteer to help others. Being alone sometimes makes things seem worse than they are.  ?? Get at least  30 minutes of exercise on most days of the week to relieve stress. Walking is a good choice. You also may want to do other activities, such as running, swimming, cycling, or playing tennis or team sports.  Relaxation techniques  Do relaxation exercises for 10 to 20 minutes a day. You can play soothing, relaxing music while you do them, if you wish.  ?? Tell others in your house that you are going to do your relaxation exercises. Ask them not to disturb you.  ?? Find a comfortable place, away from all distractions and noise.  ?? Lie down on your back, or sit with your back straight.  ?? Focus on your breathing. Make it slow and steady.  ?? Breathe in through your nose. Breathe out through either your nose or mouth.  ?? Breathe deeply, filling up the area between your navel and your rib cage. Breathe so that your belly goes up and down.  ?? Do not hold your breath.  ?? Breathe like this for 5 to 10 minutes. Notice the feeling of calmness throughout your whole body.  As you continue to breathe slowly and deeply, relax by doing the following for another 5 to 10 minutes:  ?? Tighten and relax each muscle group in your body. You can begin at your toes and work  your way up to your head.  ?? Imagine your muscle groups relaxing and becoming heavy.  ?? Empty your mind of all thoughts.  ?? Let yourself relax more and more deeply.  ?? Become aware of the state of calmness that surrounds you.  ?? When your relaxation time is over, you can bring yourself back to alertness by moving your fingers and toes and then your hands and feet and then stretching and moving your entire body. Sometimes people fall asleep during relaxation, but they usually wake up shortly afterward.  ?? Always give yourself time to return to full alertness before you drive a car or do anything that might cause an accident if you are not fully alert. Never play a relaxation tape while driving a car.  When should you call for help?  Call 911 anytime you think you may need  emergency care. For example, call if:  ?? You feel you cannot stop from hurting yourself or someone else.  Watch closely for changes in your health, and be sure to contact your doctor if:  ?? You have new or different anxiety.  ?? You are not getting better as expected.   Where can you learn more?   Go to [email or web address]  Enter H601 in the search box to learn more about "Panic Attacks: After Your Visit."   ?? 2006-2015 [employer]. Care instructions adapted under license by [employer] (which disclaims liability or warranty for this information). This care instruction is for use with your licensed healthcare professional. If you have questions about a medical condition or this instruction, always ask your healthcare professional. [place] any warranty or liability for your use of this information.  Content Version: 10.4.390249; Current as of: November 21, 2012

## 2013-05-04 NOTE — Progress Notes (Signed)
HPI    Katrina [name] is a 48 y.o. female presents to our office to discuss her back pain today.       Katrina [name] has been experiencing left hip pain since she ran in the marathon on 11/22/12. The pain is improving, however.    She states that occasionally, she will feel a pulling in her left IT band distribution.  She has been to PT in the past for tight hip flexors.   She has some tightness in her thoracic spine and lumbar spine,and she feels that she has decreased mobility. She is very active, and still does some running, piliates, yoga, arc training, elliptical and bike training and some weight training. Her Most recent MRIs were in 12/2012 for both thoracic and Lumbar Spine. They revealed some mild arthritis, but were otherwise normal.     She will have some periods of burning over her left lateral hip after some of her running work outs.  She has never had her form evaluated. She also has had some tightness and stiffness in her upper back and neck.     Since her last OMT session, she reports: she was sore after the treatment. However, she did notice a big improvement with her overall stiffness has improved. She reports the last visit was great. She has noticed an improvement in her range of motion. She continues to state that her running has been great. She is back down to her 11 minute/mile pace. She is still doing Pilates, and is finding that she is more flexible as well.     She denies any weakness of her left leg, or numbness, or tingling, saddle esthesia, urinary/bowel retention or incontinence.   Sitting for long periods of times makes the effected area of her buttocks very stiff, as well as prolonged standing. Moving around does alleviate her pain.     As far as her mental health, she states she has noticed an improvement. I had adjusted her Synthroid and Vitamin D treatment since her last visit. She has noticed an improvement since this change. She has decreased taking her Xanax less often. She  has been trying to get more rest, and take time out from stimulating activities, such as checking her phone, tv, computer, ect. Her energy levels have improved.     Agree with nurse history.     Current Outpatient Prescriptions   Medication Sig Dispense Refill   ??? ergocalciferol (ERGOCALCIFEROL) 50,000 unit capsule Take 1 Cap by mouth every seven (7) days for 8 doses.  8 Cap  0   ??? levothyroxine (SYNTHROID) 75 mcg tablet Take 1 Tab by mouth Daily (before breakfast).  30 Tab  1   ??? LORazepam (ATIVAN) 0.5 mg tablet Take 1 Tab by mouth every eight (8) hours as needed for Anxiety. Max Daily Amount: 1.5 mg.  30 Tab  0   ??? nystatin-triamcinolone (MYCOLOG) 100,000-0.1 unit/gram-% ointment as needed.       ??? cyclobenzaprine (FLEXERIL) 10 mg tablet 10 mg as needed.       ??? atorvastatin (LIPITOR) 40 mg tablet Take 0.5 tablets by mouth daily.  90 tablet  3   ??? b complex vitamins tablet Take 1 Tab by mouth daily.         ??? ascorbic acid (VITAMIN C) 500 mg tablet Take  by mouth.       ??? vitamin E (AQUA GEMS) 400 unit capsule Take 400 Units by mouth two (2) times a day.  Past Medical History   Diagnosis Date   ??? S/P tonsillectomy 07/25/2007   ??? S/P appendectomy 07/25/2007   ??? Obesity 07/25/2007     Improved with Lifestyle Modifications   ??? Hyperlipidemia 07/25/2007     Improving with weight loss   ??? Fatty liver 07/25/2007     In NASH study at [facility] (Dr. Hilary Hertz)   ??? Elevated triglycerides with high cholesterol 07/25/2007   ??? Depression 07/25/2007   ??? GE reflux 07/25/2007     Has improved with weight loss (has GI at [facility])   ??? HTN 07/25/2007     Well controlled with diet and exercise   ??? Hypothyroidism 07/25/2007     Last Check 10/2012, TSH 0.89, 04/13/13- 0.557 (decreased Synthroid 04/14/13)   ??? Pap smear for cervical cancer screening 08/02/10     [name])   ??? Hx of mammogram 7/12     MRI, 6 mo   ??? Adrenal adenoma 12/12/2009     Saw Nephrologist for this    ??? Chest pain, unspecified 12/12/2009      Was previously established with Dr. Sinclair Ship   ??? Diabetes mellitus, type 2 ([place])      Currently diet and exericse controlled, last A1c 5.2 (10/2012)   ??? Osteoarthritis of lumbar spine      MRI 12/14   ??? Osteoarthritis of thoracic spine      MRI 12/14, Stable         ROS  Katrina [name] denies headache, vision changes, SOB, chest pain or tightness, abdominal pain, fecal or new urinary incontinence, weakness, gait change, numbness, tingling, swelling, and skin changes.  ROS is otherwise negative.    PHYSICAL EXAMINATION  BP 144/81   Pulse 76   Temp(Src) 99.3 ??F (37.4 ??C) (Oral)   Resp 18   Ht 5\' 5"  (1.651 m)   Wt 184 lb (83.462 kg)   BMI 30.62 kg/m2   SpO2 97%   LMP 04/27/2013    General appearance - Well nourished. Well appearing.  Well developed.  Mild discomfort noted with position change. Overweight.  Cooperative.  Difficulty relaxing.  Head - normocephalic.  Atraumatic. Increased occipital muscle tension noted.    Heme/Lymph - peripheral pulses normal x 4 extremities.  No peripheral edema is noted.  Skin - no rashes, erythema, ecchymosis, lacerations, abrasions  Chest - Elevated first rib on the right.   Back exam - Full range of motion, with increased tightness appreciated in flexion.  Increased muscle tension in the paravertebral musculature in the occipital, thoracic, lumbar and sacral regions.  No pain on palpation of the spinous processes in the cervical, thoracic, lumbar, sacral regions.  No CVA tenderness.  Pain noted with palpation to paraspinal muscles of the cervical, upper thoracic, and lumbar spine with associated muscle spasm.  Pelvis -   Pelvic rotation to the right noted.   Sacrum - Decreased Spring.  Sacrum is rotated to the left.  Neurological - awake, alert and oriented to person, place, and time and event.  Cranial nerves II through XII intact  Normal speech.  No focal findings.  Muscle strength is +5/5 x 4 extremities.  Sensation is intact to light touch bilaterally.  Steady gait.   Negative Straight Leg Test bilaterally.  Able to walk on heels and toes.   Musculoskeletal - Intact x 4 extremities.  Full ROM x 4 extremities.  No pain with movement.  No pain on palpation of the bilateral shoulders,  elbows, wrists, hands.  No tenderness in the pelvis, pubic bone, bilateral hips, knees, ankles.  Tenderness noted with palpation of hip flexors bilaterally. No obvious deformity or swelling.  Tight hamstrings, iliopsoas muscles, and IT bands noted bilaterally. Spasming left Piriformis Muscles. Decreased bilateral scapula movement.         ASSESSMENT/PLAN  OMT Procedure was discussed with the patient. Verbal consent was received.  Osteopathic Manipulation Therapy was performed during this visit.   Procedure Note:   Muscle Energy: Location:   lumbars: Result:   good release and increased symmetry and ROM after treatment, pelvis: Result:   good release and increased symmetry and ROM after treatment and lower extremities: Result:   good release and increased symmetry and ROM after treatment, and Cervicals: Result:   good release and increased symmetry and ROM after treatment  Myofascial Releases: Location:   thoracics: Result:   good release and increased symmetry after treatment, lumbars: Result: good release and increased symmetry after treatment and sacral: Result: good release and increased symmetry after treatment. Scapula: Result: good release and increased symmetry after treatment  Counterstrain: Location:   Left Piriformis - lower extremities: Result:  More than 50% improvment after treatment  Pubic Release: Result: reset pelvis and sacrum  Myofascial Stretching : Result:  decreased muscle tension  Lumbar Roll  lumbars: Result:   good release and increased symmetry and ROM after treatment  Sacral Compression: Result: increase sacral motility  Direct pressure on Right elevated first rib  Suboccipital release, Result: Decreased muscle tension  Advised patient:   1. Do not self adjust.  2. Increase  water intake.  3. May be more uncomfortable the first 24-48 hours after receiving OMT. Use OTC Tylenol Arthritis or Ibuprofen up to 800mg  with food every 8 hours as needed for  pain.  4. Avoid lifting, pushing or pulling more than 5 to 10 pounds until better.   5. Avoid prolonged sitting, standing, bending, reaching or lying down until better.  6. Use moist heat as needed for 20 minutes several times daily. Can alternate with ice.  7. Do exercises as demonstrated.  Refer to handouts provided.  8. Avoid triggering activities.  9. WATCH YOUR POSTURE.  10. Return to our office for a follow up visit 2-4  weeks or sooner if symptoms worsen or persist.      For Katrina [name]'s symptoms of depression, fatigue, anhedonia, and weight gain, have improved with self relaxation techniques, as well as making some adjustments with her medications.   I provided support and encouragement today.     Continue PRN Anxiolytic. I continued to acknowledge that she does not want to go start medication for this. Provided patient education today on self relaxation techniques. She is going on a vacation soon to the [place] next week. Will continue to monitor.   Patient expresses understanding of treatment plan and agrees with recommendations.    Brain Hilts, DO

## 2013-05-04 NOTE — Progress Notes (Signed)
Chief Complaint   Patient presents with   ??? Osteopathic Manipulation Treatment     1. Have you been to the ER, urgent care clinic since your last visit?  Hospitalized since your last visit?No    2. Have you seen or consulted any other health care providers outside of the [facility] since your last visit?  Include any pap smears or colon screening. No

## 2013-05-06 MED ORDER — LORAZEPAM 0.5 MG TAB
0.5 mg | ORAL_TABLET | Freq: Three times a day (TID) | ORAL | Status: DC | PRN
Start: 2013-05-06 — End: 2013-11-09

## 2013-05-06 NOTE — Patient Instructions (Signed)
Hypothyroidism: After Your Visit  Your Care Instructions  You have hypothyroidism, which means that your body is not making enough thyroid hormone. This hormone helps your body use energy. If your thyroid level is low, you may feel tired, be constipated, have an increase in your blood pressure, or have dry skin or memory problems. You may also get cold easily, even when it is warm. Women with low thyroid levels may have heavy menstrual periods.  A blood test to find your thyroid-stimulating hormone (TSH) level is used to check for hypothyroidism. A high TSH level may mean that you have low thyroid. When your body is not making enough thyroid hormone, TSH levels rise in an effort to make the body produce more.  The treatment for hypothyroidism is to take thyroid hormone pills. You should start to feel better in 1 to 2 weeks. But it can take several months to see changes in the TSH level. You will need regular visits with your doctor to make sure you have the right dose of medicine.  Most people need treatment for the rest of their lives. You will need to see your doctor regularly to have blood tests and to make sure you are doing well.  Follow-up care is a key part of your treatment and safety. Be sure to make and go to all appointments, and call your doctor if you are having problems. It???s also a good idea to know your test results and keep a list of the medicines you take.  How can you care for yourself at home?  ?? Take your thyroid hormone medicine exactly as prescribed. Call your doctor if you think you are having a problem with your medicine. Most people do not have side effects if they take the right amount of medicine regularly.  ?? Take the medicine 30 minutes before breakfast, and do not take it with calcium, vitamins, or iron.  ?? Do not take extra doses of your thyroid medicine. It will not help you get better any faster, and it may cause side effects.  ?? If you forget to take a dose, do NOT take a double  dose of medicine. Take your usual dose the next day.  ?? Tell your doctor about all prescription, herbal, or over-the-counter products you take.  ?? Take care of yourself. Eat a healthy diet, get enough sleep, and get regular exercise.  When should you call for help?  Call 911 anytime you think you may need emergency care. For example, call if:  ?? You passed out (lost consciousness).  ?? You have severe trouble breathing.  ?? You have a very slow heartbeat (less than 60 beats a minute).  ?? You have a low body temperature (95??F or below).  Call your doctor now or seek immediate medical care if:  ?? You feel tired, sluggish, or weak.  ?? You have trouble remembering things or concentrating.  ?? You do not begin to feel better 2 weeks after starting your medicine.  Watch closely for changes in your health, and be sure to contact your doctor if you have any problems.   Where can you learn more?   Go to http://www.healthwise.net/[facility]  Enter N862 in the search box to learn more about "Hypothyroidism: After Your Visit."   ?? 2006-2015 [employer]. Care instructions adapted under license by [facility] (which disclaims liability or warranty for this information). This care instruction is for use with your licensed healthcare professional. If you have questions about a medical   condition or this instruction, always ask your healthcare professional. Health[employer], Inc[employer] disclaims any warranty or liability for your use of this information.  Content Version: 10.4.390249; Current as of: November 21, 2012

## 2013-05-06 NOTE — Telephone Encounter (Signed)
Rx for Ativan 0.5 mg called into pharmacy on file per Dr. Evonnie Pat. Hard copy voided and prescription destroyed by this nurse.

## 2013-05-06 NOTE — Telephone Encounter (Signed)
Spoke with patient after verifying name and DOB regarding not using [facility] and sticking with her local pharmacy.  Dr. Evonnie Pat wanted me to inform the patient that taking Ativan is for an Interim only.  Patient stated it was ok to send the prescription to her local pharmacy.

## 2013-05-06 NOTE — Telephone Encounter (Signed)
Patient called on mobile number on file to inform patient about not using [facility] and using her local pharmacy but message left to return call to office.

## 2013-05-06 NOTE — Progress Notes (Signed)
Chief Complaint   Patient presents with   ??? Labs     cholestrol    ??? Follow-up     6 month     1. Have you been to the ER, urgent care clinic since your last visit?  Hospitalized since your last visit?No    2. Have you seen or consulted any other health care providers outside of the [place] since your last visit?  Include any pap smears or colon screening. No

## 2013-05-06 NOTE — Progress Notes (Signed)
HISTORY OF PRESENT ILLNESS  Katrina [name] is a 48 y.o. female presents with Labs and Follow-up    Katrina [name] is here today for a 6 month follow up appointment.   She has continued to maintain a very healthy and active lifestyle. She has maintained significant weight loss for some time now. Along with a statin medication and her lifestyle changes, she has been trying to lower her cholesterol, and her LDL particle numbers.     I reviewed her recent HDL lab work with her today including previous Vitamin D studies, Thyroid studies, and cholesterol.   She has an upcoming appointment with her NASH study clinic to have her HDL labs drawn again in mid May.   We had recently adjusted her Synthroid dose as her TSH was depressed. She was feeling more anxiety, depression, anhedonia, and has been more tearful. She notes significant increased stress with work. She plans on taking a trip next week to the [place] with some friends. However, she does find herself needing to take the PRN Ativan daily.   She denies any concern for self harm, and is tolerating the PRN dosing OK.   Agree with nurse note.      ROS    Denies   Consitutional: fevers, chills, unintentional weight loss, night sweats  EENT: Rhinorrhea, congestion, sore throat  Cardiovascular/Respiratory: chest pain, shortness of breath, chest pain/shortness of breath with exertion, coughing  GI: abdominal pain, nausea, vomiting, diarrhea, constipation, blood in the stool  Urology: dysuria  Neurology: new headaches, visual changes, or weakness  MSK: New Joint pain/aches/swelling  Skin: New rashes/skin changes      ALLERGIES:    Allergies   Allergen Reactions   ??? Aldactone [Spironolactone] Swelling   ??? Codeine Rash and Itching       CURRENT MEDICATIONS:    Current Outpatient Prescriptions   Medication Sig Dispense Refill   ??? LORazepam (ATIVAN) 0.5 mg tablet Take 1 Tab by mouth every eight (8) hours as needed for Anxiety. Max Daily Amount: 1.5 mg.  30 Tab  1   ???  ergocalciferol (ERGOCALCIFEROL) 50,000 unit capsule Take 1 Cap by mouth every seven (7) days for 8 doses.  8 Cap  0   ??? levothyroxine (SYNTHROID) 75 mcg tablet Take 1 Tab by mouth Daily (before breakfast).  30 Tab  1   ??? nystatin-triamcinolone (MYCOLOG) 100,000-0.1 unit/gram-% ointment as needed.       ??? cyclobenzaprine (FLEXERIL) 10 mg tablet 10 mg as needed.       ??? atorvastatin (LIPITOR) 40 mg tablet Take 0.5 tablets by mouth daily.  90 tablet  3   ??? b complex vitamins tablet Take 1 Tab by mouth daily.         ??? ascorbic acid (VITAMIN C) 500 mg tablet Take  by mouth.       ??? vitamin E (AQUA GEMS) 400 unit capsule Take 400 Units by mouth two (2) times a day.             PAST MEDICAL HISTORY:    Past Medical History   Diagnosis Date   ??? S/P tonsillectomy 07/25/2007   ??? S/P appendectomy 07/25/2007   ??? Obesity 07/25/2007     Improved with Lifestyle Modifications   ??? Hyperlipidemia 07/25/2007     Improving with weight loss   ??? Fatty liver 07/25/2007     In NASH study at [facility] (Dr. Kathleen ArgueVelimar Luketic)   ??? Elevated triglycerides with high cholesterol 07/25/2007   ???  Depression 07/25/2007   ??? GE reflux 07/25/2007     Has improved with weight loss (has GI at [facility])   ??? HTN 07/25/2007     Well controlled with diet and exercise   ??? Hypothyroidism 07/25/2007     Last Check 10/2012, TSH 0.89, 04/13/13- 0.557 (decreased Synthroid 04/14/13)   ??? Pap smear for cervical cancer screening 08/02/10     [name])   ??? Hx of mammogram 7/12     MRI, 6 mo   ??? Adrenal adenoma 12/12/2009     Saw Nephrologist for this    ??? Chest pain, unspecified 12/12/2009     Was previously established with Dr. Sinclair Ship   ??? Diabetes mellitus, type 2 ([place])      Currently diet and exericse controlled, last A1c 5.2 (10/2012)   ??? Osteoarthritis of lumbar spine      MRI 12/14   ??? Osteoarthritis of thoracic spine      MRI 12/14, Stable       PAST SURGICAL HISTORY:    Past Surgical History   Procedure Laterality Date   ??? Pr appendectomy     ??? Hx  tonsillectomy     ??? Hx breast reduction     ??? Hx cholecystectomy     ??? Pr abdomen surgery proc unlisted  08/01/09     laprascopic right adrenalectomy       FAMILY HISTORY:    Family History   Problem Relation Age of Onset   ??? Hypertension Mother    ??? Breast Cancer Mother 55   ??? Stroke Mother    ??? Diabetes Father    ??? Hypertension Father    ??? Stroke Sister 22     Carotid Artery Dissection       SOCIAL HISTORY:    History     Social History   ??? Marital Status: SINGLE     Spouse Name: Single     Number of Children: 0   ??? Years of Education: N/A     Occupational History   ??? [employer]     Social History Main Topics   ??? Smoking status: Former Smoker -- 0.30 packs/day for 10 years     Types: Cigarettes     Quit date: 02/08/2009   ??? Smokeless tobacco: Never Used   ??? Alcohol Use: 0.5 oz/week     1 Glasses of wine per week   ??? Drug Use: No   ??? Sexual Activity: Yes     Other Topics Concern   ??? Not on file     Social History Narrative       IMMUNIZATIONS:    Immunization History   Administered Date(s) Administered   ??? Influenza Vaccine Split 10/24/2010   ??? Pneumococcal Polysaccharide (PPV-23) 12/22/2010   ??? Pneumococcal Vaccine 12/05/2005   ??? TD Vaccine 10/23/2000   ??? TDAP Vaccine 10/24/2010         PHYSICAL EXAMINATION    Vital Signs  BP 121/87   Pulse 72   Temp(Src) 98.4 ??F (36.9 ??C) (Oral)   Resp 18   Ht 5\' 5"  (1.651 m)   Wt 184 lb (83.462 kg)   BMI 30.62 kg/m2   SpO2 99%   LMP 04/27/2013    General appearance - Well nourished. Well appearing.  Well developed.  No acute distress. Overweight.   Head - Normocephalic.  Atraumatic.    Eyes - Extraocular eye movements intact.   Ears -  Hearing is grossly normal bilaterally.      Nose - normal and patent. No discharge noted.    Mouth - mucous membranes with adequate moisture.  Posterior pharynx appears normal with no erythema, white exudate or obstruction.  Neck - supple.  Midline trachea. No thyromegaly noted.  Chest - clear to auscultation bilaterally anterriorly and  posteriorly.  No wheezes.  No rales or rhonchi.  Breath sounds are symmetrical bilaterally.  Unlabored respirations.  Heart - normal rate.  Regular rhythm.  Normal S1, S2.  No murmur noted.  No rubs, clicks or gallops noted.  Abdomen - soft and nondistended.  No masses or organomegaly.  No rebound, rigidity or guarding.  Bowel sounds normal x 4 quadrants.  No tenderness noted.  Neurological - awake, alert. Cranial nerves II through XII grossly intact.  Clear speech.  Heme/Lymph - peripheral pulses normal x 4 extremities. No peripheral edema is noted.    Skin - no rashes, erythema, ecchymosis, noted, warm to touch  Psychological -   normal behavior, dress and thought processes.  Good insight. Good eye contact.  Intermittently tearful affect.  Appropriate mood.  Normal speech.    DATA REVIEWED    Results for orders placed in visit on 04/13/13   CBC W/O DIFF       Result Value Ref Range    WBC 8.2  3.4 - 10.8 x10E3/uL    RBC 4.82  3.77 - 5.28 x10E6/uL    HGB 13.5  11.1 - 15.9 g/dL    HCT 41.6  34.0 - 46.6 %    MCV 86  79 - 97 fL    MCH 28.0  26.6 - 33.0 pg    MCHC 32.5  31.5 - 35.7 g/dL    RDW 13.3  12.3 - 15.4 %    PLATELET 321  150 - 379 [id number]   METABOLIC PANEL, BASIC       Result Value Ref Range    Glucose 94  65 - 99 mg/dL    BUN 10  6 - 24 mg/dL    Creatinine 0.60  0.57 - 1.00 mg/dL    GFR est non-AA 110  >59 mL/min/1.73    GFR est AA 127  >59 mL/min/1.73    BUN/Creatinine ratio 17  9 - 23    Sodium 140  134 - 144 mmol/L    Potassium 4.8  3.5 - 5.2 mmol/L    Chloride 103  97 - 108 mmol/L    CO2 20  18 - 29 mmol/L    Calcium 9.7  8.7 - 10.2 mg/dL   VITAMIN D, 25 HYDROXY       Result Value Ref Range    VITAMIN D, 25-HYDROXY 31.4  30.0 - 100.0 ng/mL   TSH, 3RD GENERATION       Result Value Ref Range    TSH 0.557  0.450 - 4.500 uIU/mL   INSULIN       Result Value Ref Range    Insulin 16.7  2.6 - 24.9 uIU/mL     ASSESSMENT and PLAN    Ruthell was seen today for labs and follow-up.    Diagnoses and associated  orders for this visit:    Hyperlipidemia    Idiopathic hypothyroidism    Fatty liver    Stress  - LORazepam (ATIVAN) 0.5 mg tablet; Take 1 Tab by mouth every eight (8) hours as needed for Anxiety. Max Daily Amount: 1.5 mg.    Adjustment disorder with mixed anxiety  and depressed mood  - LORazepam (ATIVAN) 0.5 mg tablet; Take 1 Tab by mouth every eight (8) hours as needed for Anxiety. Max Daily Amount: 1.5 mg.        Katrina [name] is here today for her 6 month check up.   She has plans to have her HDL lab work drawn in a few weeks. Currently, she is undergoing treatment doses of Vitamin D, and we have adjusted her Thyroid medication.   I asked her to complete the Vitamin D treatment prior to resuming her 2000 IU of OTC vitamin D, and to continue her current dose of synthroid until she has her lab work repeated   The HDL lab work will evaluate her labs comprehensively and include Vitamin D studies, thyroid studies, as well as extensive Cholesterol testing.     For her mood, she requests to have a refill of her Anxiety medication at this time for PRN use. She is hesitant to restart any medications for daily use, such as an SSRI. She has been on multiple medications in the past, including Wellbutrin, and most recently Lexapro. She felt as if she was in a "fog" when she was on Lexapro. She would like to see if the thyroid medication adjustments, Vitamin D treatment, and taking a vacation can help with her overall mood prior to re-starting any new additional medication.   If her HDL lab work reveals that her Vitamin D levels are improved, her thyroid function is back to normal, and she is still experiencing her current symptoms, we will re-visit starting a daily medication.   Encouraged her to continue to follow up with her specialists as scheduled.   She has an upcoming appointment for her MRI of her breast for her breast cancer screening, and needs to schedule her Pap with her GYN.       Patient expresses understanding of  treatment plan and agrees with recommendations.          Patient Instructions       Hypothyroidism: After Your Visit  Your Care Instructions  You have hypothyroidism, which means that your body is not making enough thyroid hormone. This hormone helps your body use energy. If your thyroid level is low, you may feel tired, be constipated, have an increase in your blood pressure, or have dry skin or memory problems. You may also get cold easily, even when it is warm. Women with low thyroid levels may have heavy menstrual periods.  A blood test to find your thyroid-stimulating hormone (TSH) level is used to check for hypothyroidism. A high TSH level may mean that you have low thyroid. When your body is not making enough thyroid hormone, TSH levels rise in an effort to make the body produce more.  The treatment for hypothyroidism is to take thyroid hormone pills. You should start to feel better in 1 to 2 weeks. But it can take several months to see changes in the TSH level. You will need regular visits with your doctor to make sure you have the right dose of medicine.  Most people need treatment for the rest of their lives. You will need to see your doctor regularly to have blood tests and to make sure you are doing well.  Follow-up care is a key part of your treatment and safety. Be sure to make and go to all appointments, and call your doctor if you are having problems. It???s also a good idea to know your test results and keep a list  of the medicines you take.  How can you care for yourself at home?  ?? Take your thyroid hormone medicine exactly as prescribed. Call your doctor if you think you are having a problem with your medicine. Most people do not have side effects if they take the right amount of medicine regularly.  ?? Take the medicine 30 minutes before breakfast, and do not take it with calcium, vitamins, or iron.  ?? Do not take extra doses of your thyroid medicine. It will not help you get better any faster, and  it may cause side effects.  ?? If you forget to take a dose, do NOT take a double dose of medicine. Take your usual dose the next day.  ?? Tell your doctor about all prescription, herbal, or over-the-counter products you take.  ?? Take care of yourself. Eat a healthy diet, get enough sleep, and get regular exercise.  When should you call for help?  Call 911 anytime you think you may need emergency care. For example, call if:  ?? You passed out (lost consciousness).  ?? You have severe trouble breathing.  ?? You have a very slow heartbeat (less than 60 beats a minute).  ?? You have a low body temperature (95??F or below).  Call your doctor now or seek immediate medical care if:  ?? You feel tired, sluggish, or weak.  ?? You have trouble remembering things or concentrating.  ?? You do not begin to feel better 2 weeks after starting your medicine.  Watch closely for changes in your health, and be sure to contact your doctor if you have any problems.   Where can you learn more?   Go to [employer]  Enter [phone] [phone] in the search box to learn more about "Hypothyroidism: After Your Visit."   ?? 2006-2015 [employer]. Care instructions adapted under license by [employer] (which disclaims liability or warranty for this information). This care instruction is for use with your licensed healthcare professional. If you have questions about a medical condition or this instruction, always ask your healthcare professional. [place] any warranty or liability for your use of this information.  Content Version: 10.4.390249; Current as of: November 21, 2012                    Brain Hilts, DO

## 2013-05-25 MED ORDER — SERTRALINE 50 MG TAB
50 mg | ORAL_TABLET | Freq: Every day | ORAL | Status: DC
Start: 2013-05-25 — End: 2013-07-27

## 2013-05-25 NOTE — Patient Instructions (Addendum)
Advised patient:   1. Do not self adjust.  2. Increase water intake.  3. May be more uncomfortable the first 24-48 hours after receiving OMT. Use OTC Tylenol Arthritis or Ibuprofen up to 800mg  with food every 8 hours as needed for  pain.  4. Avoid lifting, pushing or pulling more than 5 to 10 pounds until better.   5. Avoid prolonged sitting, standing, bending, reaching or lying down until better.  6. Use moist heat as needed for 20 minutes several times daily. Can alternate with ice.  7. Do exercises as demonstrated.  Refer to handouts provided.  8. Avoid triggering activities.  9. WATCH YOUR POSTURE.  10. Return to our office for a follow up visit 2-4  weeks or sooner if symptoms worsen or persist.  Sertraline (By mouth)   Sertraline (SER-tra-leen)  Treats depression, obsessive-compulsive disorder (OCD), posttraumatic stress disorder (PTSD), premenstrual dysphoric disorder (PMDD), social anxiety disorder, and panic disorder. This medicine is an SSRI.   Brand Name(s):Zoloft   There may be other brand names for this medicine.  When This Medicine Should Not Be Used:   This medicine is not right for everyone. Do not use it if you had an allergic reaction to sertraline.  How to Use This Medicine:   Liquid, Tablet  ?? Take your medicine as directed. Your dose may need to be changed several times to find what works best for you. You may need to take it for a few weeks or months before you feel better.  ?? Oral liquid: Use the dropper provided to remove the medicine and mix it with 1/2 cup (4 ounces) of water, ginger ale, lemon-lime soda, lemonade, or orange juice. Drink the mixture right away. It is normal for it to look a bit hazy.  ?? This medicine should come with a Medication Guide. Ask your pharmacist for a copy if you do not have one.  ?? Missed dose: Take a dose as soon as you remember. If it is almost time for your next dose, wait until then and take a regular dose. Do not take extra medicine to make up for a missed  dose.  ?? Store the medicine in a closed container at room temperature, away from heat, moisture, and direct light.  Drugs and Foods to Avoid:   Ask your doctor or pharmacist before using any other medicine, including over-the-counter medicines, vitamins, and herbal products.  ?? Do not use this medicine together with pimozide. Do not use this medicine and an MAO inhibitor (MAOI) within 14 days of each other. Do not use the oral liquid form of sertraline if you are also using disulfiram.  ?? Some medicines can affect how sertraline works. Tell your doctor if you are using the following:   ?? Buspirone, cimetidine, cisapride, diazepam, digitoxin, fentanyl, flecainide, lithium, phenytoin, propafenone, St John's wort, tramadol, tryptophan supplements, or valproate  ?? A blood thinner (such as warfarin), a diuretic (water pill), an NSAID pain or arthritis medicine (such as aspirin, diclofenac, ibuprofen), a tricyclic antidepressant, a triptan medicine for migraine headaches  ?? Do not drink alcohol while you are using this medicine.  Warnings While Using This Medicine:   ?? Tell your doctor if you are pregnant or breastfeeding, or if you have liver disease, bleeding problems, glaucoma, heart disease, or a seizure disorder.  ?? For some children, teenagers, and young adults, this medicine may increase mental or emotional problems. This may lead to thoughts of suicide and violence. Talk with your doctor  right away if you have any thoughts or behavior changes that concern you. Tell your doctor if you or anyone in your family has a history of bipolar disorder or suicide attempts.  ?? This medicine may cause the following problems:   ?? Serotonin syndrome (when taken with certain medicines)  ?? Low sodium levels (more common in elderly patients and those who take diuretics or become dehydrated)  ?? Tell your doctor if you are sensitive to latex, because the oral liquid comes with a latex rubber dropper.  ?? This medicine may make you  dizzy or drowsy. Do not drive or do anything that could be dangerous until you know how this medicine affects you.  ?? Do not stop using this medicine suddenly. Your doctor will need to slowly decrease your dose before you stop it completely.  ?? Your doctor will check your progress and the effects of this medicine at regular visits. Keep all appointments.  ?? Keep all medicine out of the reach of children. Never share your medicine with anyone.  Possible Side Effects While Using This Medicine:   Call your doctor right away if you notice any of these side effects:  ?? Allergic reaction: Itching or hives, swelling in your face or hands, swelling or tingling in your mouth or throat, chest tightness, trouble breathing  ?? Anxiety, restlessness, fast heartbeat, fever, sweating, muscle spasms, twitching, nausea, vomiting, diarrhea, seeing or hearing things that are not there  ?? Blistering, peeling, or red skin rash  ?? Confusion, weakness, and muscle twitching  ?? Eye pain, vision changes, seeing halos around lights  ?? Feeling more excited or energetic than usual  ?? Thoughts of hurting yourself or others, unusual behavior  ?? Unusual bleeding or bruising  If you notice these less serious side effects, talk with your doctor:   ?? Dry mouth  ?? Loss of appetite, weight loss  ?? Mild diarrhea, constipation, nausea, vomiting  ?? Sexual problems  ?? Sleepiness, or trouble sleeping  If you notice other side effects that you think are caused by this medicine, tell your doctor.   Call your doctor for medical advice about side effects. You may report side effects to FDA at 1-800-FDA-1088  ?? 2014 [place] is for End User's use only and may not be sold, redistributed or otherwise used for commercial purposes.  The above information is an educational aid only. It is not intended as medical advice for individual conditions or treatments. Talk to your doctor, nurse or pharmacist before following any medical regimen  to see if it is safe and effective for you.

## 2013-05-25 NOTE — Progress Notes (Signed)
Chief Complaint   Patient presents with   ??? Osteopathic Manipulation Treatment     shoulder and legs     1. Have you been to the ER, urgent care clinic since your last visit?  Hospitalized since your last visit? no    2. Have you seen or consulted any other health care providers outside of the [facility] since your last visit?  Include any pap smears or colon screening. no

## 2013-05-25 NOTE — Progress Notes (Signed)
HPI    Katrina [name] is a 48 y.o. female presents to our office to discuss her back pain today.       Ms. Fogal has been experiencing left hip pain since she ran in the marathon on 11/22/12. The pain is much improved, however.    She states that occasionally, she will feel a pulling in her left IT band distribution during her runs, especially her long runs.  She has been to PT in the past for tight hip flexors.   She has some tightness in her thoracic spine and lumbar spine,and she feels that she has decreased mobility. She is very active, and still does some running, piliates, yoga, arc training, elliptical and bike training and some weight training. Her Most recent MRIs were in 12/2012 for both thoracic and Lumbar Spine. They revealed some mild arthritis, but were otherwise normal.     Since her last OMT session, she reports: she was sore after the treatment. However, she did notice a big improvement with her overall stiffness has improved. She reports the last visit was great. She has noticed an improvement in her range of motion. She is still doing Pilates, and is finding that she is more flexible as well. She has an upcoming 33 K race on Memorial day.   She denies any weakness of her left leg, or numbness, or tingling, saddle esthesia, urinary/bowel retention or incontinence.   Sitting for long periods of times makes the effected area of her buttocks very stiff, as well as prolonged standing. Moving around does alleviate her pain.     As far as her mental health, she states continues to experience irritability, increased stress, feeling of depression and frequent crying, as well as anxiety about everything that she has going on in her life. She was considering starting to take classes at a local community college, but thinks she will have to put this off for now. She finds herself being unable to focus at work and complete tasks. I had adjusted her Synthroid and Vitamin D treatment about 6 weeks ago,  and she would like to see if making adjustments to these medications have been helpful.   She denies any concern for self harm, and feels safe.   She is also frustrated because of this stress has also led to poorer diet decisions, and she is gaining weight again.     Agree with nurse history.     Current Outpatient Prescriptions   Medication Sig Dispense Refill   ??? LORazepam (ATIVAN) 0.5 mg tablet Take 1 Tab by mouth every eight (8) hours as needed for Anxiety. Max Daily Amount: 1.5 mg.  30 Tab  1   ??? ergocalciferol (ERGOCALCIFEROL) 50,000 unit capsule Take 1 Cap by mouth every seven (7) days for 8 doses.  8 Cap  0   ??? levothyroxine (SYNTHROID) 75 mcg tablet Take 1 Tab by mouth Daily (before breakfast).  30 Tab  1   ??? nystatin-triamcinolone (MYCOLOG) 100,000-0.1 unit/gram-% ointment as needed.       ??? cyclobenzaprine (FLEXERIL) 10 mg tablet 10 mg as needed.       ??? atorvastatin (LIPITOR) 40 mg tablet Take 0.5 tablets by mouth daily.  90 tablet  3   ??? b complex vitamins tablet Take 1 Tab by mouth daily.         ??? ascorbic acid (VITAMIN C) 500 mg tablet Take  by mouth.       ??? vitamin E (AQUA GEMS) 400  unit capsule Take 400 Units by mouth two (2) times a day.             Past Medical History   Diagnosis Date   ??? S/P tonsillectomy 07/25/2007   ??? S/P appendectomy 07/25/2007   ??? Obesity 07/25/2007     Improved with Lifestyle Modifications   ??? Hyperlipidemia 07/25/2007     Improving with weight loss   ??? Fatty liver 07/25/2007     In NASH study at [facility] (Dr. Hilary Hertz)   ??? Elevated triglycerides with high cholesterol 07/25/2007   ??? Depression 07/25/2007   ??? GE reflux 07/25/2007     Has improved with weight loss (has GI at [facility])   ??? HTN 07/25/2007     Well controlled with diet and exercise   ??? Hypothyroidism 07/25/2007     Last Check 10/2012, TSH 0.89, 04/13/13- 0.557 (decreased Synthroid 04/14/13)   ??? Pap smear for cervical cancer screening 08/02/10     [name])   ??? Hx of mammogram 7/12     MRI, 6 mo    ??? Adrenal adenoma 12/12/2009     Saw Nephrologist for this    ??? Chest pain, unspecified 12/12/2009     Was previously established with Dr. Sinclair Ship   ??? Diabetes mellitus, type 2 ([place])      Currently diet and exericse controlled, last A1c 5.2 (10/2012)   ??? Osteoarthritis of lumbar spine      MRI 12/14   ??? Osteoarthritis of thoracic spine      MRI 12/14, Stable   ??? Idiopathic hypothyroidism 05/06/2013         ROS  Ivar Drape denies headache, vision changes, SOB, chest pain or tightness, abdominal pain, fecal or new urinary incontinence, weakness, gait change, numbness, tingling, swelling, and skin changes.  ROS is otherwise negative.    PHYSICAL EXAMINATION  BP 133/87   Pulse 69   Temp(Src) 98.4 ??F (36.9 ??C) (Oral)   Resp 16   Ht 5\' 5"  (1.651 m)   Wt 189 lb 11.2 oz (86.047 kg)   BMI 31.57 kg/m2   SpO2 99%   LMP 05/25/2013    General appearance - Well nourished. Well appearing.  Well developed.  Mild discomfort noted with position change. Overweight.  Cooperative.  Difficulty relaxing.  Head - normocephalic.  Atraumatic. Increased occipital muscle tension noted.    Heme/Lymph - peripheral pulses normal x 4 extremities.  No peripheral edema is noted.  Skin - no rashes, erythema, ecchymosis, lacerations, abrasions  Chest - Elevated first rib on the right.   Back exam - Full range of motion, with increased tightness appreciated in flexion.  Increased muscle tension in the paravertebral musculature in the occipital, thoracic, lumbar and sacral regions.  No pain on palpation of the spinous processes in the cervical, thoracic, lumbar, sacral regions.  No CVA tenderness.  Pain noted with palpation to paraspinal muscles of the cervical, upper thoracic, and lumbar spine with associated muscle spasm.  Pelvis -   Pelvic rotation to the right noted.   Sacrum - Decreased Spring.  Sacrum is rotated to the right.  Neurological - awake, alert and oriented to person, place, and time and event.  Cranial nerves II through XII  intact  Normal speech.  No focal findings.  Muscle strength is +5/5 x 4 extremities.  Sensation is intact to light touch bilaterally.  Steady gait.  Negative Straight Leg Test bilaterally.  Able to walk  on heels and toes.   Musculoskeletal - Intact x 4 extremities.  Full ROM x 4 extremities.  No pain with movement.  No pain on palpation of the bilateral shoulders, elbows, wrists, hands.  No tenderness in the pelvis, pubic bone, bilateral hips, knees, ankles.  Tenderness noted with palpation of hip flexors bilaterally. No obvious deformity or swelling.  Tight hamstrings, iliopsoas muscles, and IT bands noted bilaterally. Spasming left Piriformis Muscles. Decreased bilateral scapula movement.     OMT Procedure was discussed with the patient. Verbal consent was received.  Osteopathic Manipulation Therapy was performed during this visit.   Procedure Note:   Muscle Energy: Location:   lumbars: Result:   good release and increased symmetry and ROM after treatment, pelvis: Result:   good release and increased symmetry and ROM after treatment and lower extremities: Result:   good release and increased symmetry and ROM after treatment, and Cervicals: Result:   good release and increased symmetry and ROM after treatment  Myofascial Releases: Location:   thoracics: Result:   good release and increased symmetry after treatment, lumbars: Result: good release and increased symmetry after treatment and sacral: Result: good release and increased symmetry after treatment. Scapula: Result: good release and increased symmetry after treatment  Counterstrain: Location:   Left Piriformis - lower extremities: Result:  More than 50% improvment after treatment  Pubic Release: Result: reset pelvis and sacrum  Myofascial Stretching : Result:  decreased muscle tension  Lumbar Roll  lumbars: Result:   good release and increased symmetry and ROM after treatment  Sacral Compression: Result: increase sacral motility  Direct pressure on Right  elevated first rib  Suboccipital release, Result: Decreased muscle tension    ASSESSMENT/PLAN    1. Depression with anxiety    - sertraline (ZOLOFT) 50 mg tablet; Take 0.5 Tabs by mouth daily.  Dispense: 30 Tab; Refill: 3    2. Idiopathic hypothyroidism    - T4, FREE    3. Vitamin D deficiency    - VITAMIN D, 25 HYDROXY    4. Somatic dysfunction    - OSTEOPATHIC MANIP,7-8 BODY REGN    5. Somatic dysfunction of cervical region    - OSTEOPATHIC MANIP,7-8 BODY REGN    6. Somatic dysfunction of thoracic region    - OSTEOPATHIC MANIP,7-8 BODY REGN    7. Somatic dysfunction of lumbar region    - OSTEOPATHIC MANIP,7-8 BODY REGN    8. Somatic dysfunction of sacral region    - OSTEOPATHIC MANIP,7-8 BODY REGN    9. Somatic dysfunction of upper extremities    - OSTEOPATHIC MANIP,7-8 BODY REGN    10. Somatic dysfunction of pelvic region    - OSTEOPATHIC MANIP,7-8 BODY REGN    11. Somatic dysfunction of lower extremities    - OSTEOPATHIC MANIP,7-8 BODY REGN      I spent a lot of time discussing Ms. Gianfrancesco's symptoms of depression, fatigue, anhedonia, and weight gain. I provided support and encouragement today. We discussed other interventions and treatment options today. I recommended considering talking to her counselor again, as well as reinitiating an SSRI. I discussed Zoloft in detail, including possible side effects. She is agreeable to starting this medication.   Provided patient education today. Will re-assess her symptoms and tolerance in 2 weeks.     Continue PRN Anxiolytic.   Patient expresses understanding of treatment plan and agrees with recommendations.      Advised patient:   1. Do not self adjust.  2. Increase water intake.  3. May be more uncomfortable the first 24-48 hours after receiving OMT. Use OTC Tylenol Arthritis or Ibuprofen up to 800mg  with food every 8 hours as needed for  pain.  4. Avoid lifting, pushing or pulling more than 5 to 10 pounds until better.   5. Avoid prolonged sitting, standing,  bending, reaching or lying down until better.  6. Use moist heat as needed for 20 minutes several times daily. Can alternate with ice.  7. Do exercises as demonstrated.  Refer to handouts provided.  8. Avoid triggering activities.  9. WATCH YOUR POSTURE.  10. Return to our office for a follow up visit 2-4  weeks or sooner if symptoms worsen or persist.  11.   Odie Serahristina Harner, DO

## 2013-05-26 ENCOUNTER — Encounter

## 2013-05-26 LAB — TSH 3RD GENERATION: TSH: 1.05 u[IU]/mL (ref 0.450–4.500)

## 2013-05-26 LAB — VITAMIN D, 25 HYDROXY: VITAMIN D, 25-HYDROXY: 27.2 ng/mL — ABNORMAL LOW (ref 30.0–100.0)

## 2013-05-26 LAB — T4, FREE: T4, Free: 1.3 ng/dL (ref 0.82–1.77)

## 2013-05-26 MED ORDER — ERGOCALCIFEROL (VITAMIN D2) 50,000 UNIT CAP
1250 mcg (50,000 unit) | ORAL_CAPSULE | ORAL | Status: DC
Start: 2013-05-26 — End: 2013-07-03

## 2013-05-26 NOTE — Progress Notes (Signed)
Quick Note:    Please let the patient know that her Thyroid looks much better, but interestingly her Vitamin D is lower. I will send in another prescription strength Vitamin D treatment, but tell her to start to take her Over the counter vitamin D again daily. Thanks  ______

## 2013-05-26 NOTE — Progress Notes (Signed)
Quick Note:    Spoke with patient, ID verified X 2. Informed patient per Dr. Evonnie Pat that her thyroid looks much better,but her vitamin D is lower. Informed patient per Dr. Evonnie Pat will send another prescription strength vitamin D treatment. Dr. Evonnie Pat recommends that you start over the counter vitamin D again daily. Patient verbalize understanding.  ______

## 2013-05-27 ENCOUNTER — Encounter

## 2013-06-04 LAB — PT/INR EXTERNAL
INR, External: 1
Prothrombin time, External: 13.5

## 2013-06-04 LAB — AMB EXT CREATININE: Creatinine, External: 0.67

## 2013-06-06 LAB — AMB EXT LDL-C: LDL-C, External: 86

## 2013-06-08 NOTE — Telephone Encounter (Signed)
Noted. Thank you

## 2013-06-08 NOTE — Telephone Encounter (Signed)
Spoke with patient, ID verified X 2. Patient informed this Probation officer that Dr. Evonnie Pat her to call in about how she is doing with her new medication and patient stated that she is doing fine.

## 2013-06-15 MED ORDER — LEVOTHYROXINE 75 MCG TAB
75 mcg | ORAL_TABLET | Freq: Every day | ORAL | Status: DC
Start: 2013-06-15 — End: 2013-06-15

## 2013-06-15 MED ORDER — LEVOTHYROXINE 75 MCG TAB
75 mcg | ORAL_TABLET | Freq: Every day | ORAL | Status: DC
Start: 2013-06-15 — End: 2013-11-19

## 2013-06-15 NOTE — Patient Instructions (Signed)
Advised patient:   1. Do not self adjust.  2. Increase water intake.  3. May be more uncomfortable the first 24-48 hours after receiving OMT. Use OTC Tylenol Arthritis or Ibuprofen up to 800mg  with food every 8 hours as needed for  pain.  4. Avoid lifting, pushing or pulling more than 5 to 10 pounds until better.   5. Avoid prolonged sitting, standing, bending, reaching or lying down until better.  6. Use moist heat as needed for 20 minutes several times daily. Can alternate with ice.  7. Do exercises as demonstrated.  Refer to handouts provided.  8. Avoid triggering activities.  9. WATCH YOUR POSTURE.  10. Return to our office for a follow up visit 2-4  weeks or sooner if symptoms worsen or persist.

## 2013-06-15 NOTE — Progress Notes (Signed)
HPI    Katrina [name] is a 48 y.o. female presents to our office to discuss her back pain today.       Katrina [name] has been experiencing left hip pain since she ran in the marathon on 11/22/12. The pain is much improved, however.    She states that occasionally, she will feel a pulling in her left IT band distribution during her runs, especially her long runs.  She has been to PT in the past for tight hip flexors.   She has some tightness in her thoracic spine and lumbar spine,and she feels that she has decreased mobility. She is very active, and still does some running, piliates, yoga, arc training, elliptical and bike training and some weight training. Her Most recent MRIs were in 12/2012 for both thoracic and Lumbar Spine. They revealed some mild arthritis, but were otherwise normal.     Since her last OMT session, she reports: she was sore after the treatment. However, she did notice a big improvement with her overall stiffness has improved. She reports the last visit was great. She has noticed an improvement in her range of motion. She is still doing Pilates, and is finding that she is more flexible as well. She did run her 69 K race on Memorial day, and had a great run.   She denies any weakness of her left leg, or numbness, or tingling, saddle esthesia, urinary/bowel retention or incontinence.   Sitting for long periods of times makes the effected area of her buttocks very stiff, as well as prolonged standing. Moving around does alleviate her pain.     As far as her mental health, she states she is overall doing much better. She is able to focus, she is starting to get her energy back, and she is no longer having crying spells. She has not needed to take the Ativan since she has started the Zoloft. I had adjusted her Synthroid and Vitamin D treatment about 10 weeks ago, however, this did not do much in the way of improving her mood. She denies any concern for self harm, and feels safe.   She has also  started to take a class at the local community college, and she is excited.     Agree with nurse history.   Current Outpatient Prescriptions   Medication Sig Dispense Refill   ??? levothyroxine (SYNTHROID) 75 mcg tablet Take 1 Tab by mouth Daily (before breakfast).  90 Tab  3   ??? ergocalciferol (ERGOCALCIFEROL) 50,000 unit capsule Take 1 Cap by mouth every seven (7) days for 8 doses.  8 Cap  0   ??? sertraline (ZOLOFT) 50 mg tablet Take 0.5 Tabs by mouth daily.  30 Tab  3   ??? LORazepam (ATIVAN) 0.5 mg tablet Take 1 Tab by mouth every eight (8) hours as needed for Anxiety. Max Daily Amount: 1.5 mg.  30 Tab  1   ??? nystatin-triamcinolone (MYCOLOG) 100,000-0.1 unit/gram-% ointment as needed.       ??? cyclobenzaprine (FLEXERIL) 10 mg tablet 10 mg as needed.       ??? atorvastatin (LIPITOR) 40 mg tablet Take 0.5 tablets by mouth daily.  90 tablet  3   ??? b complex vitamins tablet Take 1 Tab by mouth daily.         ??? ascorbic acid (VITAMIN C) 500 mg tablet Take  by mouth.       ??? vitamin E (AQUA GEMS) 400 unit capsule Take 400 Units by  mouth two (2) times a day.             Past Medical History   Diagnosis Date   ??? S/P tonsillectomy 07/25/2007   ??? S/P appendectomy 07/25/2007   ??? Obesity 07/25/2007     Improved with Lifestyle Modifications   ??? Hyperlipidemia 07/25/2007     Improving with weight loss   ??? Fatty liver 07/25/2007     In NASH study at [facility] (Dr. Hilary Hertz)   ??? Elevated triglycerides with high cholesterol 07/25/2007   ??? Depression 07/25/2007   ??? GE reflux 07/25/2007     Has improved with weight loss (has GI at [facility])   ??? HTN 07/25/2007     Well controlled with diet and exercise   ??? Hypothyroidism 07/25/2007     Last Check 10/2012, TSH 0.89, 04/13/13- 0.557 (decreased Synthroid 04/14/13)   ??? Pap smear for cervical cancer screening 08/02/10     [name])   ??? Hx of mammogram 7/12     MRI, 6 mo   ??? Adrenal adenoma 12/12/2009     Saw Nephrologist for this    ??? Chest pain, unspecified 12/12/2009     Was  previously established with Dr. Sinclair Ship   ??? Diabetes mellitus, type 2 ([place])      Currently diet and exericse controlled, last A1c 5.2 (10/2012)   ??? Osteoarthritis of lumbar spine      MRI 12/14   ??? Osteoarthritis of thoracic spine      MRI 12/14, Stable   ??? 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 Vitamin D deficiency 06/15/2013         ROS  Katrina [name] denies headache, vision changes, SOB, chest pain or tightness, abdominal pain, fecal or new urinary incontinence, weakness, gait change, numbness, tingling, swelling, and skin changes.  ROS is otherwise negative.    PHYSICAL EXAMINATION  BP 133/84   Pulse 65   Temp(Src) 99 ??F (37.2 ??C) (Oral)   Resp 16   Ht 5\' 5"  (1.651 m)   Wt 186 lb 8 oz (84.596 kg)   BMI 31.04 kg/m2   SpO2 99%   LMP 05/25/2013    General appearance - Well nourished. Well appearing.  Well developed.  Mild discomfort noted with position change. Overweight.  Cooperative.  Difficulty relaxing.  Head - normocephalic.  Atraumatic. Increased occipital muscle tension noted.    Heme/Lymph - peripheral pulses normal x 4 extremities.  No peripheral edema is noted.  Skin - no rashes, erythema, ecchymosis, lacerations, abrasions  Chest - Elevated first rib on the right.   Back exam - Full range of motion, with increased tightness appreciated in flexion.  Increased muscle tension in the paravertebral musculature in the occipital, thoracic, lumbar and sacral regions.  No pain on palpation of the spinous processes in the cervical, thoracic, lumbar, sacral regions.  No CVA tenderness.  Pain noted with palpation to paraspinal muscles of the cervical, upper thoracic, and lumbar spine with associated muscle spasm.  Pelvis -   Pelvic rotation to the right noted. Positive standing flexion test on the left. Left ASIS is inferior, and left PSIS is superior. (Left Anterior Innominate).   Sacrum - Decreased Spring.  Sacrum is rotated to the right.  Neurological - awake, alert and oriented to person, place, and  time and event.  Cranial nerves II through XII intact  Normal speech.  No focal findings.  Muscle strength is +5/5 x 4 extremities.  Sensation is intact to light touch bilaterally.  Steady gait.  Negative Straight Leg Test bilaterally.  Able to walk on heels and toes.   Musculoskeletal - Intact x 4 extremities.  Full ROM x 4 extremities.  No pain with movement.  No pain on palpation of the bilateral shoulders, elbows, wrists, hands.  No tenderness in the pelvis, pubic bone, bilateral hips, knees, ankles.  Tenderness noted with palpation of hip flexors bilaterally. No obvious deformity or swelling.  Tight hamstrings, iliopsoas muscles, and IT bands noted bilaterally. Spasming left Piriformis Muscles. Decreased bilateral scapula movement.     OMT Procedure was discussed with the patient. Verbal consent was received.  Osteopathic Manipulation Therapy was performed during this visit.   Procedure Note:   Muscle Energy: Location:   lumbars: Result:   good release and increased symmetry and ROM after treatment, pelvis: Result:   good release and increased symmetry and ROM after treatment and lower extremities: Result:   good release and increased symmetry and ROM after treatment, and Cervicals: Result:   good release and increased symmetry and ROM after treatment  Myofascial Releases: Location:   thoracics: Result:   good release and increased symmetry after treatment, lumbars: Result: good release and increased symmetry after treatment and sacral: Result: good release and increased symmetry after treatment. Scapula: Result: good release and increased symmetry after treatment  Counterstrain: Location:   Left Piriformis - lower extremities: Result:  More than 50% improvment after treatment  Pubic Release: Result: reset pelvis and sacrum  Myofascial Stretching : Result:  decreased muscle tension  Lumbar Roll  lumbars: Result:   good release and increased symmetry and ROM after treatment  Sacral Compression: Result: increase  sacral motility  Direct pressure on Right elevated first rib  Suboccipital release, Result: Decreased muscle tension    ASSESSMENT/PLAN    1. Depression with anxiety    - Continue sertraline (ZOLOFT) 50 mg tablet; Take 0.5 Tabs by mouth daily.  Dispense: 30 Tab; Refill: 3    2. Idiopathic hypothyroidism    3. Vitamin D deficiency    4. Somatic dysfunction    - OSTEOPATHIC MANIP,7-8 BODY REGN    5. Somatic dysfunction of cervical region    - OSTEOPATHIC MANIP,7-8 BODY REGN    6. Somatic dysfunction of thoracic region    - OSTEOPATHIC MANIP,7-8 BODY REGN    7. Somatic dysfunction of lumbar region    - OSTEOPATHIC MANIP,7-8 BODY REGN    8. Somatic dysfunction of sacral region    - OSTEOPATHIC MANIP,7-8 BODY REGN    9. Somatic dysfunction of upper extremities    - OSTEOPATHIC MANIP,7-8 BODY REGN    10. Somatic dysfunction of pelvic region    - OSTEOPATHIC MANIP,7-8 BODY REGN    11. Somatic dysfunction of lower extremities    - OSTEOPATHIC MANIP,7-8 BODY REGN    Provided support and encouragement today. She is doing a lot better from a mental health standpoint, and would like to continue on her current regimen.  Continue PRN Anxiolytic.   Patient expresses understanding of treatment plan and agrees with recommendations.      Advised patient:   1. Do not self adjust.  2. Increase water intake.  3. May be more uncomfortable the first 24-48 hours after receiving OMT. Use OTC Tylenol Arthritis or Ibuprofen up to 800mg  with food every 8 hours as needed for  pain.  4. Avoid lifting, pushing or pulling more than 5 to 10 pounds until better.  5. Avoid prolonged sitting, standing, bending, reaching or lying down until better.  6. Use moist heat as needed for 20 minutes several times daily. Can alternate with ice.  7. Do exercises as demonstrated.  Refer to handouts provided.  8. Avoid triggering activities.  9. WATCH YOUR POSTURE.  10. Return to our office for a follow up visit 2-4  weeks or sooner if symptoms worsen or persist.   [place], DO

## 2013-06-15 NOTE — Progress Notes (Signed)
Chief Complaint   Patient presents with   ??? Osteopathic Manipulation Treatment     upper back and legs     1. Have you been to the ER, urgent care clinic since your last visit?  Hospitalized since your last visit? no    2. Have you seen or consulted any other health care providers outside of the [place] since your last visit?  Include any pap smears or colon screening. no    Patient stated that she will have in PAP in Mid July.

## 2013-07-03 ENCOUNTER — Encounter

## 2013-07-03 MED ORDER — ERGOCALCIFEROL (VITAMIN D2) 50,000 UNIT CAP
1250 mcg (50,000 unit) | ORAL_CAPSULE | ORAL | Status: AC
Start: 2013-07-03 — End: 2013-08-22

## 2013-07-03 NOTE — Telephone Encounter (Signed)
Last office visit 06-15-2013

## 2013-07-09 NOTE — Patient Instructions (Addendum)
Nutritionist:  Katrina [name] Tel: [phone] ???  [address] ??? [place], [place]   Plantar Fasciitis: Exercises  Your Care Instructions  Here are some examples of typical rehabilitation exercises for your condition. Start each exercise slowly. Ease off the exercise if you start to have pain.  Your doctor or physical therapist will tell you when you can start these exercises and which ones will work best for you.  How to do the exercises  Note: Each exercise should create a pulling feeling but should not cause pain.  Towel stretch    1. Sit with your legs extended and knees straight.  2. Place a towel around your foot just under the toes. A towel will give you a more effective stretch.  3. Hold each end of the towel in each hand, with your hands above your knees.  4. Pull back with the towel so that your foot stretches toward you.  5. Hold the position for at least 15 to 30 seconds.  6. Repeat 2 to 4 times a session, up to 5 sessions a day.  Calf stretch    Note: This exercise stretches the muscles at the back of the lower leg (the calf) and the Achilles tendon. Do this exercise 3 or 4 times a day, 5 days a week.  1. Stand facing a wall with your hands on the wall at about eye level. Put the leg you want to stretch about a step behind your other leg.  2. Keeping your back heel on the floor, bend your front knee until you feel a stretch in the back leg.  3. Hold the stretch for 15 to 30 seconds. Repeat 2 to 4 times.  Plantar fascia and calf stretch    Note: Stretching the plantar fascia and calf muscles can increase flexibility and decrease heel pain. You can do this exercise several times each day and before and after activity.  1. Stand on a step as shown above. Be sure to hold on to the banister.  2. Slowly let your heels down over the edge of the step as you relax your calf muscles. You should feel a gentle stretch across the bottom of your foot and up the back of your leg to your knee.   3. Hold the stretch about 15 to 30 seconds, and then tighten your calf muscle a little to bring your heel back up to the level of the step. Repeat 2 to 4 times.  Towel curls    1. While sitting, place your foot on a towel on the floor and scrunch the towel toward you with your toes.  2. Then, also using your toes, push the towel away from you.  Note: Make this exercise more challenging by placing a weighted object, such as a soup can, on the other end of the towel.  Marble pickups    1. Put marbles on the floor next to a cup.  2. Using your toes, try to lift the marbles up from the floor and put them in the cup.  Follow-up care is a key part of your treatment and safety. Be sure to make and go to all appointments, and call your doctor if you are having problems. It's also a good idea to know your test results and keep a list of the medicines you take.   Where can you learn more?   Go to [employer]  Enter [phone] in the search box to learn more about "Plantar  Fasciitis: Exercises."   ?? 2006-2015 [employer], [employer]. Care instructions adapted under license by [employer] (which disclaims liability or warranty for this information). This care instruction is for use with your licensed healthcare professional. If you have questions about a medical condition or this instruction, always ask your healthcare professional. [place] any warranty or liability for your use of this information.  Content Version: 10.5.422740; Current as of: November 21, 2012              Iliotibial Band Syndrome: Exercises  Your Care Instructions  Here are some examples of typical rehabilitation exercises for your condition. Start each exercise slowly. Ease off the exercise if you start to have pain.  Your doctor or physical therapist will tell you when you can start these exercises and which ones will work best for you.  How to do the exercises  Iliotibial band stretch     7. Lean sideways against a wall. If you are not steady on your feet, hold on to a chair or counter.  8. Stand on the leg with the affected hip, with that leg close to the wall. Then cross your other leg in front of it.  9. Let your affected hip drop out to the side of your body and against the wall. Then lean away from your affected hip until you feel a stretch.  10. Hold the stretch for 15 to 30 seconds.  11. Repeat 2 to 4 times.  Piriformis stretch    4. Lie on your back with your legs straight.  5. Lift your affected leg and bend your knee. With your opposite hand, reach across your body, and then gently pull your knee toward your opposite shoulder.  6. Hold the stretch for 15 to 30 seconds.  7. Repeat 2 to 4 times.  Hamstring wall stretch    4. Lie on your back in a doorway, with your good leg through the open door.  5. Slide your affected leg up the wall to straighten your knee. You should feel a gentle stretch down the back of your leg.  ?? Do not arch your back.  ?? Do not bend either knee.  ?? Keep one heel touching the floor and the other heel touching the wall. Do not point your toes.  6. Hold the stretch for at least 1 minute to begin. Then try to lengthen the time you hold the stretch to as long as 6 minutes.  7. Repeat 2 to 4 times.  If you do not have a place to do this exercise in a doorway, there is another way to do it:  3. Lie on your back, and bend the knee of your affected leg.  4. Loop a towel under the ball and toes of that foot, and hold the ends of the towel in your hands.  5. Straighten your knee, and slowly pull back on the towel. You should feel a gentle stretch down the back of your leg.  6. Hold the stretch for 15 to 30 seconds. Or even better, hold the stretch for 1 minute if you can.  7. Repeat 2 to 4 times.  Follow-up care is a key part of your treatment and safety. Be sure to make and go to all appointments, and call your doctor if you are having  problems. It's also a good idea to know your test results and keep a list of the medicines you take.   Where can you learn more?  Go to [employer]  Enter P252 in the search box to learn more about "Iliotibial Band Syndrome: Exercises."   ?? 2006-2015 [employer], Inc[employer]. Care instructions adapted under license by [employer] (which disclaims liability or warranty for this information). This care instruction is for use with your licensed healthcare professional. If you have questions about a medical condition or this instruction, always ask your healthcare professional. [place] any warranty or liability for your use of this information.  Content Version: 10.5.422740; Current as of: June 11, 2012

## 2013-07-09 NOTE — Progress Notes (Signed)
Chief Complaint   Patient presents with   ??? Osteopathic Manipulation Treatment     upper shoulder, hips     1. Have you been to the ER, urgent care clinic since your last visit?  Hospitalized since your last visit? no    2. Have you seen or consulted any other health care providers outside of the [facility] since your last visit?  Include any pap smears or colon screening. no

## 2013-07-09 NOTE — Progress Notes (Signed)
HPI    Katrina [name] is a 48 y.o. female presents to our office to discuss her back pain today.       Katrina [name] has been experiencing left hip pain since she ran in the marathon on 11/22/12. The pain is much improved, however.    She states that occasionally, she will feel a pulling in her left IT band distribution during her runs, especially her long runs.  She has been to PT in the past for tight hip flexors.   She has some tightness in her thoracic spine and lumbar spine,and she feels that she has decreased mobility. She is very active, and still does some running, piliates, yoga, arc training, elliptical and bike training and some weight training. Her Most recent MRIs were in 12/2012 for both thoracic and Lumbar Spine. They revealed some mild arthritis, but were otherwise normal.     Since her last OMT session, she reports: she was sore after the treatment. However, she did notice a big improvement with her overall stiffness has improved and almost resolved.   She reports the last visit was great. She has noticed an improvement in her range of motion. She is still doing Pilates, and is finding that she is more flexible as well.   She has some tightness of her neck and shoulders, and some left lateral hip pain that radiates down to her posterior knee in her IT band distribution. She recently bought new shoes, but has noticed some increased tightness in her left calf as well as some tenderness on the bottom of her foot when she takes her first few steps in the morning.   She denies any weakness of her left leg, or numbness, or tingling, saddle esthesia, urinary/bowel retention or incontinence.   Sitting for long periods of times makes the effected area of her buttocks very stiff, as well as prolonged standing. Moving around does alleviate her pain.     As far as her mental health, she states she is overall doing much better. She has noticed a slight anxiety when she really starts to think about  things she has to do. Her motivation and energy has been better, however, she thinks this could still improve. She is only taking Zoloft 25mg  daily.   She is able to focus, she is starting to get her energy back, and she is no longer having crying spells. She has not needed to take the Ativan since she has started the Zoloft.   She denies any concern for self harm, and feels safe.       Agree with nurse history.   Current Outpatient Prescriptions   Medication Sig Dispense Refill   ??? ergocalciferol (ERGOCALCIFEROL) 50,000 unit capsule Take 1 Cap by mouth every seven (7) days for 8 doses. 8 Cap 0   ??? levothyroxine (SYNTHROID) 75 mcg tablet Take 1 Tab by mouth Daily (before breakfast). 90 Tab 3   ??? sertraline (ZOLOFT) 50 mg tablet Take 0.5 Tabs by mouth daily. 30 Tab 3   ??? LORazepam (ATIVAN) 0.5 mg tablet Take 1 Tab by mouth every eight (8) hours as needed for Anxiety. Max Daily Amount: 1.5 mg. 30 Tab 1   ??? nystatin-triamcinolone (MYCOLOG) 100,000-0.1 unit/gram-% ointment as needed.     ??? cyclobenzaprine (FLEXERIL) 10 mg tablet 10 mg as needed.     ??? atorvastatin (LIPITOR) 40 mg tablet Take 0.5 tablets by mouth daily. 90 tablet 3   ??? b complex vitamins tablet Take 1 Tab  by mouth daily.       ??? ascorbic acid (VITAMIN C) 500 mg tablet Take  by mouth.     ??? vitamin E (AQUA GEMS) 400 unit capsule Take 400 Units by mouth two (2) times a day.           Past Medical History   Diagnosis Date   ??? S/P tonsillectomy 07/25/2007   ??? S/P appendectomy 07/25/2007   ??? Obesity 07/25/2007     Improved with Lifestyle Modifications   ??? Hyperlipidemia 07/25/2007     Improving with weight loss   ??? Fatty liver 07/25/2007     In NASH study at [facility] (Dr. Hilary Hertz)   ??? Elevated triglycerides with high cholesterol 07/25/2007   ??? Depression 07/25/2007   ??? GE reflux 07/25/2007     Has improved with weight loss (has GI at [facility])   ??? HTN 07/25/2007     Well controlled with diet and exercise   ??? Hypothyroidism 07/25/2007      Last Check 10/2012, TSH 0.89, 04/13/13- 0.557 (decreased Synthroid 04/14/13)   ??? Pap smear for cervical cancer screening 08/02/10     [name])   ??? Hx of mammogram 7/12     MRI, 6 mo   ??? Adrenal adenoma 12/12/2009     Saw Nephrologist for this    ??? Chest pain, unspecified 12/12/2009     Was previously established with Dr. Sinclair Ship   ??? Diabetes mellitus, type 2 ([place])      Currently diet and exericse controlled, last A1c 5.2 (10/2012)   ??? Osteoarthritis of lumbar spine      MRI 12/14   ??? Osteoarthritis of thoracic spine      MRI 12/14, Stable   ??? 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 Vitamin D deficiency 06/15/2013         ROS  Katrina [name] denies headache, vision changes, SOB, chest pain or tightness, abdominal pain, fecal or new urinary incontinence, weakness, gait change, numbness, tingling, swelling, and skin changes.  ROS is otherwise negative.    PHYSICAL EXAMINATION  BP 140/90 mmHg   Pulse 70   Temp(Src) 98.8 ??F (37.1 ??C) (Oral)   Resp 16   Ht 5\' 5"  (1.651 m)   Wt 185 lb 6.4 oz (84.097 kg)   BMI 30.85 kg/m2   SpO2 98%   LMP 06/24/2013    General appearance - Well nourished. Well appearing.  Well developed.  Mild discomfort noted with position change. Overweight.  Cooperative.  Difficulty relaxing.  Head - normocephalic.  Atraumatic. Increased occipital muscle tension noted.    Heme/Lymph - peripheral pulses normal x 4 extremities.  No peripheral edema is noted.  Skin - no rashes, erythema, ecchymosis, lacerations, abrasions  Chest - Thoracic inlet rotated to the left. Elevated first rib on the right.   Back exam - Full range of motion, with increased tightness appreciated in flexion.  Increased muscle tension in the paravertebral musculature in the occipital, thoracic, lumbar and sacral regions.  No pain on palpation of the spinous processes in the cervical, thoracic, lumbar, sacral regions.  No CVA tenderness.  Pain noted with palpation to paraspinal muscles of the  cervical, upper thoracic, and lumbar spine with associated muscle spasm.  Pelvis -   Pelvic rotation to the right noted.   Sacrum - Decreased Spring.  Sacrum is rotated to the left.  Neurological - awake, alert and oriented to person, place, and time and event.  Cranial nerves II through XII intact  Normal speech.  No focal findings.  Muscle strength is +5/5 x 4 extremities.  Sensation is intact to light touch bilaterally.  Steady gait.  Negative Straight Leg Test bilaterally.  Able to walk on heels and toes.   Musculoskeletal - Intact x 4 extremities.  Full ROM x 4 extremities.  No pain with movement.  No pain on palpation of the bilateral shoulders, elbows, wrists, hands.  No tenderness in the pelvis, pubic bone, bilateral hips, knees, ankles.  Tenderness noted with palpation of hip flexors bilaterally. No obvious deformity or swelling.  Tight hamstrings, iliopsoas muscles, and IT bands noted bilaterally. Spasming left Piriformis Muscles. Decreased bilateral scapula movement.     OMT Procedure was discussed with the patient. Verbal consent was received.  Osteopathic Manipulation Therapy was performed during this visit.   Procedure Note:   Muscle Energy: Location:   lumbars: Result:   good release and increased symmetry and ROM after treatment, pelvis: Result:   good release and increased symmetry and ROM after treatment and lower extremities: Result:   good release and increased symmetry and ROM after treatment, and Cervicals: Result:   good release and increased symmetry and ROM after treatment  Myofascial Releases: Location:   Cericals: Result:   good release and increased symmetry after treatment,  thoracics: Result:   good release and increased symmetry after treatment, lumbars: Result: good release and increased symmetry after treatment and sacral: Result: good release and increased symmetry after treatment. Scapula: Result: good release and increased symmetry after treatment   Counterstrain: Location:   Left Piriformis - lower extremities: Result:  More than 50% improvment after treatment  Pubic Release: Result: reset pelvis and sacrum  Myofascial Stretching : Result:  decreased muscle tension  Sacral Compression: Result: increase sacral motility  Direct pressure on Right elevated first rib  Suboccipital release, Result: Decreased muscle tension  Reset Thoracic inlet and Diaphragm: result: increased ROM and symmetry after treatment.     ASSESSMENT/PLAN    Advised patient:   1. Do not self adjust.  2. Increase water intake.  3. May be more uncomfortable the first 24-48 hours after receiving OMT. Use OTC Tylenol Arthritis or Ibuprofen up to 800mg  with food every 8 hours as needed for  pain.  4. Avoid lifting, pushing or pulling more than 5 to 10 pounds until better.   5. Avoid prolonged sitting, standing, bending, reaching or lying down until better.  6. Use moist heat as needed for 20 minutes several times daily. Can alternate with ice.  7. Do exercises as demonstrated.  Refer to handouts provided.  8. Avoid triggering activities.  9. WATCH YOUR POSTURE.  10. Return to our office for a follow up visit 2-4  weeks or sooner if symptoms worsen or persist.    Brain Hilts, DO

## 2013-07-27 MED ORDER — SERTRALINE 50 MG TAB
50 mg | ORAL_TABLET | Freq: Every day | ORAL | Status: DC
Start: 2013-07-27 — End: 2013-11-09

## 2013-07-27 NOTE — Progress Notes (Signed)
Chief Complaint   Patient presents with   ??? Osteopathic Manipulation Treatment     upper back, neck and legs     1. Have you been to the ER, urgent care clinic since your last visit?  Hospitalized since your last visit? no    2. Have you seen or consulted any other health care providers outside of the [facility] since your last visit?  Include any pap smears or colon screening. no

## 2013-07-27 NOTE — Progress Notes (Signed)
HPI    Katrina [name] is a 48 y.o. female presents to our office to discuss her back pain today.       Ms. Swartzentruber has been experiencing left hip pain since she ran in the marathon on 11/22/12. The pain is much improved, however.    She states that occasionally, she will feel a pulling in her left IT band distribution during her runs, especially her long runs.  She has been to PT in the past for tight hip flexors.   Her Most recent MRIs were in 12/2012 for both thoracic and Lumbar Spine. They revealed some mild arthritis, but were otherwise normal.     Since her last OMT session, she reports: she was sore after the treatment. However, she did notice a big improvement with her overall stiffness has improved and almost resolved.   She reports the last visit was great. She has noticed an improvement in her range of motion. She is still doing Pilates, and is finding that she is more flexible as well.   She has some increased tightness of her neck and shoulders, and some left lateral hip pain that radiates down to her posterior knee in her IT band distribution. Her upper back seems to be bothering her the most today.   She denies any weakness of her left leg, or numbness, or tingling, saddle esthesia, urinary/bowel retention or incontinence.   Sitting for long periods of times makes the effected area of her buttocks very stiff, as well as prolonged standing. Moving around does alleviate her pain.     As far as her mental health, she states she was doing much better, but recently she has found herself being more "weepy" again, and feeling more anxious. She finds that motivation is still an issue, and she is not exercising like she was in the recent past. She is on the Zoloft 50 mg currently. She has needed to take the Ativan a couple of times since our last visit. She feels like she has lost her joy, She denies any concern for self harm, and feels safe.       Agree with nurse history.    Current Outpatient Prescriptions   Medication Sig Dispense Refill   ??? sertraline (ZOLOFT) 50 mg tablet Take 1 Tabs by mouth daily. 45 Tab 3   ??? ergocalciferol (ERGOCALCIFEROL) 50,000 unit capsule Take 1 Cap by mouth every seven (7) days for 8 doses. 8 Cap 0   ??? levothyroxine (SYNTHROID) 75 mcg tablet Take 1 Tab by mouth Daily (before breakfast). 90 Tab 3   ??? LORazepam (ATIVAN) 0.5 mg tablet Take 1 Tab by mouth every eight (8) hours as needed for Anxiety. Max Daily Amount: 1.5 mg. 30 Tab 1   ??? nystatin-triamcinolone (MYCOLOG) 100,000-0.1 unit/gram-% ointment as needed.     ??? cyclobenzaprine (FLEXERIL) 10 mg tablet 10 mg as needed.     ??? atorvastatin (LIPITOR) 40 mg tablet Take 0.5 tablets by mouth daily. 90 tablet 3   ??? b complex vitamins tablet Take 1 Tab by mouth daily.       ??? ascorbic acid (VITAMIN C) 500 mg tablet Take  by mouth.     ??? vitamin E (AQUA GEMS) 400 unit capsule Take 400 Units by mouth two (2) times a day.           Past Medical History   Diagnosis Date   ??? S/P tonsillectomy 07/25/2007   ??? S/P appendectomy 07/25/2007   ??? Obesity 07/25/2007  Improved with Lifestyle Modifications   ??? Hyperlipidemia 07/25/2007     Improving with weight loss   ??? Fatty liver 07/25/2007     In NASH study at [facility] (Dr. Hilary Hertz)   ??? Elevated triglycerides with high cholesterol 07/25/2007   ??? Depression 07/25/2007   ??? GE reflux 07/25/2007     Has improved with weight loss (has GI at [facility])   ??? HTN 07/25/2007     Well controlled with diet and exercise   ??? Hypothyroidism 07/25/2007     Last Check 10/2012, TSH 0.89, 04/13/13- 0.557 (decreased Synthroid 04/14/13)   ??? Pap smear for cervical cancer screening 08/02/10     [name])   ??? Hx of mammogram 7/12     MRI, 6 mo   ??? Adrenal adenoma 12/12/2009     Saw Nephrologist for this    ??? Chest pain, unspecified 12/12/2009     Was previously established with Dr. Sinclair Ship   ??? Diabetes mellitus, type 2 ([place])       Currently diet and exericse controlled, last A1c 5.2 (10/2012)   ??? Osteoarthritis of lumbar spine      MRI 12/14   ??? Osteoarthritis of thoracic spine      MRI 12/14, Stable   ??? 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 Vitamin D deficiency 06/15/2013         ROS  Ivar Drape denies headache, vision changes, SOB, chest pain or tightness, abdominal pain, fecal or new urinary incontinence, weakness, gait change, numbness, tingling, swelling, and skin changes.  ROS is otherwise negative.    PHYSICAL EXAMINATION  BP 140/79 mmHg   Pulse 76   Temp(Src) 98.7 ??F (37.1 ??C) (Oral)   Resp 18   Ht 5\' 5"  (1.651 m)   Wt 188 lb 12.8 oz (85.639 kg)   BMI 31.42 kg/m2   SpO2 98%   LMP 07/23/2013    General appearance - Well nourished. Well appearing.  Well developed.  Mild discomfort noted with position change. Overweight.  Cooperative.  Difficulty relaxing.  Head - normocephalic.  Atraumatic. Increased occipital muscle tension noted.    Heme/Lymph - peripheral pulses normal x 4 extremities.  No peripheral edema is noted.  Skin - no rashes, erythema, ecchymosis, lacerations, abrasions  Chest - Thoracic inlet rotated to the left. Elevated first rib on the left.   Back exam - Full range of motion, with increased tightness appreciated in flexion.  Increased muscle tension in the paravertebral musculature in the occipital, thoracic, lumbar and sacral regions.  No pain on palpation of the spinous processes in the cervical, thoracic, lumbar, sacral regions.  No CVA tenderness.  Pain noted with palpation to paraspinal muscles of the cervical, upper thoracic, and lumbar spine with associated muscle spasm.  Pelvis -   Pelvic rotation to the right noted.   Sacrum - Decreased Spring.  Sacrum is rotated to the left.  Neurological - awake, alert and oriented to person, place, and time and event.  Cranial nerves II through XII intact  Normal speech.  No focal findings.  Muscle strength is +5/5 x 4 extremities.  Sensation is intact  to light touch bilaterally.  Steady gait.  Negative Straight Leg Test bilaterally.  Able to walk on heels and toes.   Musculoskeletal - Intact x 4 extremities.  Full ROM x 4 extremities.  No pain with movement.  No pain on palpation of the bilateral shoulders, elbows, wrists, hands.  No tenderness  in the pelvis, pubic bone, bilateral hips, knees, ankles.  Tenderness noted with palpation of hip flexors bilaterally. No obvious deformity or swelling.  Tight hamstrings, iliopsoas muscles, and IT bands noted bilaterally. Spasming left Piriformis Muscles. Decreased bilateral scapula movement.     OMT Procedure was discussed with the patient. Verbal consent was received.  Osteopathic Manipulation Therapy was performed during this visit.   Procedure Note:   Muscle Energy: Location:   lumbars: Result:   good release and increased symmetry and ROM after treatment, pelvis: Result:   good release and increased symmetry and ROM after treatment and lower extremities: Result:   good release and increased symmetry and ROM after treatment, and Cervicals: Result:   good release and increased symmetry and ROM after treatment  Myofascial Releases: Location:   Cervicals: Result:   good release and increased symmetry after treatment,  thoracics: Result:   good release and increased symmetry after treatment, lumbars: Result: good release and increased symmetry after treatment and sacral: Result: good release and increased symmetry after treatment. Scapula: Result: good release and increased symmetry after treatment  Counterstrain: Location:   Left Piriformis - lower extremities: Result:  More than 50% improvment after treatment  Pubic Release: Result: reset pelvis and sacrum  Myofascial Stretching : Result:  decreased muscle tension  Sacral Compression: Result: increase sacral motility  Direct pressure on Right elevated first rib  Suboccipital release, Result: Decreased muscle tension   Reset Thoracic inlet and Diaphragm: result: increased ROM and symmetry after treatment.     ASSESSMENT/PLAN  1. Depression with anxiety    - sertraline (ZOLOFT) 50 mg tablet; Take 1.5 Tabs by mouth daily.  Dispense: 45 Tab; Refill: 3  - REFERRAL TO PSYCHIATRY    2. Overweight (BMI 25.0-29.9)      3. Hamstring tightness      4. Vitamin D deficiency      5. Idiopathic hypothyroidism      6. Bilateral low back pain without sciatica      7. Bilateral thoracic back pain      8. Muscle spasm      9. Somatic dysfunction    - OSTEOPATHIC MANIP,9-10 BODY REGN    10. Nonallopathic lesion of head region, not elsewhere classified    - OSTEOPATHIC MANIP,9-10 BODY REGN    11. Somatic dysfunction of cervical region    - OSTEOPATHIC MANIP,9-10 BODY REGN    12. Nonallopathic lesion of rib cage, not elsewhere classified    - OSTEOPATHIC MANIP,9-10 BODY REGN    13. Somatic dysfunction of thoracic region    - OSTEOPATHIC MANIP,9-10 BODY REGN    14. Somatic dysfunction of lumbar region    - OSTEOPATHIC MANIP,9-10 BODY REGN    15. Somatic dysfunction of sacral region    - OSTEOPATHIC MANIP,9-10 BODY REGN    16. Somatic dysfunction of upper extremities    - OSTEOPATHIC MANIP,9-10 BODY REGN    17. Somatic dysfunction of pelvic region    - OSTEOPATHIC MANIP,9-10 BODY REGN    18. Somatic dysfunction of lower extremities  - OSTEOPATHIC MANIP,9-10 BODY REGN    Advised patient:   1. Do not self adjust.  2. Increase water intake.  3. May be more uncomfortable the first 24-48 hours after receiving OMT. Use OTC Tylenol Arthritis or Ibuprofen up to 800mg  with food every 8 hours as needed for  pain.  4. Avoid lifting, pushing or pulling more than 5 to 10 pounds until better.   5. Avoid prolonged sitting,  standing, bending, reaching or lying down until better.  6. Use moist heat as needed for 20 minutes several times daily. Can alternate with ice.  7. Do exercises as demonstrated.  Refer to handouts provided.  8. Avoid triggering activities.   9. WATCH YOUR POSTURE.  10. Return to our office for a follow up visit 2-4  weeks or sooner if symptoms worsen or persist.      Ms. Diver continues to suffer from her Depression and anxiety.   I have offered her a psychiatric referral today as there is going to be a transition of care as I leave [place], and she transitions to her new PCP. She needs some stability in her providers, especially for her mental health.  Will increase her Zoloft dosing again today, and continue the PRN ativan. I also encouraged her to re-establish with her counselor that she has had in the past. She denies any concerns for SI/HI.   Provided support and encouragement.   Encouraged her to follow up with me as scheduled.     Brain Hilts, DO

## 2013-07-27 NOTE — Patient Instructions (Signed)
Depression and Chronic Disease: After Your Visit  Your Care Instructions  A chronic disease is one that you have for a long time. Some chronic diseases can be controlled, but they usually cannot be cured. Depression is common in people with chronic diseases, but it often goes unnoticed.  Many people have concerns about seeking treatment for a mental health problem. You may think it's a sign of weakness, or you don't want people to know about it. It's important to overcome these reasons for not seeking treatment. Treating depression or anxiety is good for your health.  Follow-up care is a key part of your treatment and safety. Be sure to make and go to all appointments, and call your doctor if you are having problems. It's also a good idea to know your test results and keep a list of the medicines you take.  How can you care for yourself at home?  Watch for symptoms of depression  The symptoms of depression are often subtle at first. You may think they are caused by your disease rather than depression. Or you may think it is normal to be depressed when you have a chronic disease.  If you are depressed you may:  ?? Feel sad or hopeless.  ?? Feel guilty or worthless.  ?? Not enjoy the things you used to enjoy.  ?? Feel hopeless, as though life is not worth living.  ?? Have trouble thinking or remembering.  ?? Have low energy, and you may not eat or sleep well.  ?? Pull away from others.  ?? Think often about death or killing yourself. (Keep the numbers for these national suicide hotlines: 1-800-273-TALK [1-800-273-8255] and 1-800-SUICIDE [1-800-784-2433].)  Get treatment  By treating your depression, you can feel more hopeful and have more energy. If you feel better, you may take better care of yourself, so your health may improve.  ?? Talk to your doctor if you have any changes in mood during treatment for your disease.  ?? Ask your doctor for help. Counseling, antidepressant medicine, or a  combination of the two can help most people with depression. Often a combination works best. Counseling can also help you cope with having a chronic disease.  When should you call for help?  Call 911 anytime you think you may need emergency care. For example, call if:  ?? You feel like hurting yourself or someone else.  ?? Someone you know has depression and is about to attempt or is attempting suicide.  Call your doctor now or seek immediate medical care if:  ?? You hear voices.  ?? Someone you know has depression and:  ?? Starts to give away his or her possessions.  ?? Uses illegal drugs or drinks alcohol heavily.  ?? Talks or writes about death, including writing suicide notes or talking about guns, knives, or pills.  ?? Starts to spend a lot of time alone.  ?? Acts very aggressively or suddenly appears calm.  Watch closely for changes in your health, and be sure to contact your doctor if:  ?? You do not get better as expected.   Where can you learn more?   Go to http://www.healthwise.net/[facility]  Enter A548 in the search box to learn more about "Depression and Chronic Disease: After Your Visit."   ?? 2006-2015 Healthwise, Incorporated. Care instructions adapted under license by [place] (which disclaims liability or warranty for this information). This care instruction is for use with your licensed healthcare professional. If you have questions about a   medical condition or this instruction, always ask your healthcare professional. [place] any warranty or liability for your use of this information.  Content Version: 10.5.422740; Current as of: November 21, 2012              Self-Care While You Recover From Depression: After Your Visit  Your Care Instructions  Taking good care of yourself is important as you recover from depression. In time, your symptoms will fade as your treatment takes hold. Do not give up. Instead, focus your energy on getting better.   Your mood will improve. It just takes some time. Focus on things that can help you feel better, such as being with friends and family, eating well, and getting enough rest. But take things slowly. Do not do too much too soon. You will begin to feel better gradually.  Follow-up care is a key part of your treatment and safety. Be sure to make and go to all appointments, and call your doctor if you are having problems. It's also a good idea to know your test results and keep a list of the medicines you take.  How can you care for yourself at home?  Be realistic  ?? If you have a large task to do, break it up into smaller steps you can handle, and just do what you can.  ?? You may want to put off important decisions until your depression has lifted. If you have plans that will have a major impact on your life, such as marriage, divorce, or a job change, try to wait a bit. Talk it over with friends and loved ones who can help you look at the overall picture first.  ?? Reaching out to people for help is important. Do not isolate yourself. Let your family and friends help you. Find someone you can trust and confide in, and talk to that person.  ?? Be patient, and be kind to yourself. Remember that depression is not your fault and is not something you can overcome with willpower alone. Treatment is necessary for depression, just like for any other illness. Feeling better takes time, and your mood will improve little by little.  Stay active  ?? Stay busy and get outside. Take a walk, or try some other light exercise.  ?? Talk with your doctor about an exercise program. Exercise can help with mild depression.  ?? Go to a movie or concert. Take part in a church activity or other social gathering. Go to a ball game.  ?? Ask a friend to have dinner with you.  Take care of yourself  ?? Eat a balanced diet with plenty of fresh fruits and vegetables, whole grains, and lean protein. If you have lost your appetite, eat small snacks  rather than large meals.  ?? Avoid drinking alcohol or using illegal drugs. Do not take medicines that have not been prescribed for you. They may interfere with medicines you may be taking for depression, or they may make your depression worse.  ?? Take your medicines exactly as they are prescribed. You may start to feel better within 1 to 3 weeks of taking antidepressant medicine. But it can take as many as 6 to 8 weeks to see more improvement. If you have questions or concerns about your medicines, or if you do not notice any improvement by 3 weeks, talk to your doctor.  ?? If you have any side effects from your medicine, tell your doctor. Antidepressants can make you feel tired,  dizzy, or nervous. Some people have dry mouth, constipation, headaches, sexual problems, or diarrhea. Many of these side effects are mild and will go away on their own after you have been taking the medicine for a few weeks. Some may last longer. Talk to your doctor if side effects are bothering you too much. You might be able to try a different medicine.  ?? Get enough sleep. If you have problems sleeping:  ?? Go to bed at the same time every night, and get up at the same time every morning.  ?? Keep your bedroom dark and quiet.  ?? Do not exercise after 5:00 p.m.  ?? Avoid drinks with caffeine after 5:00 p.m.  ?? Avoid sleeping pills unless they are prescribed by the doctor treating your depression. Sleeping pills may make you groggy during the day, and they may interact with other medicine you are taking.  ?? If you have any other illnesses, such as diabetes, heart disease, or high blood pressure, make sure to continue with your treatment. Tell your doctor about all of the medicines you take, including those with or without a prescription.  ?? Keep the numbers for these national suicide hotlines: 1-800-273-TALK (405) 831-9756) and 1-800-SUICIDE 769-770-0382). If you or someone you  know talks about suicide or feeling hopeless, get help right away.  When should you call for help?  Call 911 anytime you think you may need emergency care. For example, call if:  ?? You feel like hurting yourself or someone else.  ?? Someone you know has depression and is about to attempt or is attempting suicide.  Call your doctor now or seek immediate medical care if:  ?? You hear voices.  ?? Someone you know has depression and:  ?? Starts to give away his or her possessions.  ?? Uses illegal drugs or drinks alcohol heavily.  ?? Talks or writes about death, including writing suicide notes or talking about guns, knives, or pills.  ?? Starts to spend a lot of time alone.  ?? Acts very aggressively or suddenly appears calm.  Watch closely for changes in your health, and be sure to contact your doctor if:  ?? You do not get better as expected.   Where can you learn more?   Go to [employer]  Enter N529 in the search box to learn more about "Self-Care While You Recover From Depression: After Your Visit."   ?? 2006-2015 Healthwise, Incorporated. Care instructions adapted under license by [employer] (which disclaims liability or warranty for this information). This care instruction is for use with your licensed healthcare professional. If you have questions about a medical condition or this instruction, always ask your healthcare professional. [place] any warranty or liability for your use of this information.  Content Version: 10.5.422740; Current as of: November 21, 2012

## 2013-07-31 NOTE — Progress Notes (Signed)
Chief Complaint   Patient presents with   ??? Medication Evaluation     follow up   ??? Labs     discuss lab results     1. Have you been to the ER, urgent care clinic since your last visit?  Hospitalized since your last visit? no    2. Have you seen or consulted any other health care providers outside of the [place] since your last visit?  Include any pap smears or colon screening. no

## 2013-07-31 NOTE — Patient Instructions (Signed)
Phentermine/Topiramate (By mouth)   Phentermine Hydrochloride (FEN-ter-meen hye-droe-KLOR-ide), Topiramate (toe-PIR-a-mate)  Used with diet and exercise to help you reach and maintain a healthy weight.   Brand Name(s):Qsymia   There may be other brand names for this medicine.  When This Medicine Should Not Be Used:   This medicine is not right for everyone. Do not use it if you had an allergic reaction to phentermine or topiramate, you are pregnant, or if you have glaucoma or an overactive thyroid.  How to Use This Medicine:   Long Acting Capsule  ?? Take your medicine as directed. Your dose may need to be changed several times to find what works best for you. Carefully follow your doctor's instructions about diet and exercise.  ?? Take this medicine in the morning, with or without food. It might keep you awake if you take it at night.  ?? Swallow the extended-release capsule whole. Do not crush, break, or chew it.  ?? Drink extra fluids so you will urinate more often and help prevent kidney problems.  ?? This medicine should come with a Medication Guide. Ask your pharmacist for a copy if you do not have one.  ?? Missed dose: If you miss a dose or forget to take your medicine, skip the missed dose. Take your regular dose the next morning. Do not take extra medicine to make up for the missed dose.  ?? Store the medicine in a closed container at room temperature, away from heat, moisture, and direct light.  Drugs and Foods to Avoid:   Ask your doctor or pharmacist before using any other medicine, including over-the-counter medicines, vitamins, and herbal products.  ?? Do not use this medicine if you have used an MAO inhibitor (MAOI) within the past 14 days.  ?? Some medicines can affect how phentermine/topiramate works. Tell your doctor if you are using the following:  ?? Acetazolamide, dichlorphenamide, methazolamide, zonisamide  ?? Birth control pills  ?? Other diet pills (including nonprescription or herbal products)   ?? Diuretic (water pill)  ?? Medicine to treat seizures (such as valproic acid, carbamazepine, phenytoin)  ?? Tell your doctor if you use anything else that makes you sleepy. Some examples are allergy medicine, narcotic pain medicine, and alcohol.  ?? Do not drink alcohol while you are using this medicine.  Warnings While Using This Medicine:   ?? It is not safe to take this medicine during pregnancy. It could harm an unborn baby. Tell your doctor right away if you become pregnant. You should have a negative pregnancy test before you start taking this medicine and every month during treatment. Tell your doctor right away if you miss a period.  ?? Tell your doctor if you are breastfeeding, or if you have kidney disease, liver disease, diabetes, heart failure, heart rhythm problems, or a history of stroke or heart attack.  ?? Rarely, this medicine may increase the risk of suicidal thoughts or actions. Tell your doctor if you have a history of depression or suicide attempts.  ?? This medicine could cause the following problems:   ?? Vision problems or glaucoma  ?? Metabolic acidosis (too much acid in the blood)  ?? An increased heart rate  ?? Changes in blood sugar levels  ?? Kidney stones  ?? This medicine may cause you to feel dizzy, drowsy, or confused, or to have trouble thinking or speaking. Do not drive or do anything else that could be dangerous until you know how this medicine affects you.  ??   Do not stop using this medicine suddenly. Your doctor will need to slowly decrease your dose before you stop it completely. You might have a seizure if you stop taking the medicine too fast.  ?? Your doctor will do lab tests at regular visits to check on the effects of this medicine. Keep all appointments.  ?? Keep all medicine out of the reach of children. Never share your medicine with anyone.  Possible Side Effects While Using This Medicine:   Call your doctor right away if you notice any of these side effects:   ?? Allergic reaction: Itching or hives, swelling in your face or hands, swelling or tingling in your mouth or throat, chest tightness, trouble breathing  ?? Bloody or dark urine, sudden back pain, stomach pain, painful urination  ?? Decreased sweating, fever, or feeling hot  ?? Dry mouth, increased thirst, muscle cramps, nausea or vomiting  ?? Eye pain, vision changes, seeing halos around lights  ?? Fast breathing, loss of appetite, unusual tiredness  ?? Fast, pounding, or uneven heartbeat  ?? Lightheadedness, dizziness, or fainting  ?? Problems with speech or memory, trouble concentrating, confusion  ?? Seizures  ?? Thoughts of hurting yourself, depression, anxiety, trouble sleeping  If you notice these less serious side effects, talk with your doctor:   ?? Constipation  ?? Metallic taste in your mouth, dry mouth  ?? Numbness, tingling, or burning pain in your hands, arms, legs, or feet  If you notice other side effects that you think are caused by this medicine, tell your doctor.   Call your doctor for medical advice about side effects. You may report side effects to FDA at 1-800-FDA-1088  ?? 2014 [place] is for End User's use only and may not be sold, redistributed or otherwise used for commercial purposes.  The above information is an educational aid only. It is not intended as medical advice for individual conditions or treatments. Talk to your doctor, nurse or pharmacist before following any medical regimen to see if it is safe and effective for you.  Naltrexone/Bupropion (By mouth)   Bupropion Hydrochloride (bue-PROE-pee-on hye-droe-KLOR-ide), Naltrexone Hydrochloride (nal-TREX-one hye-droe-KLOR-ide)  Used with diet and exercise to help you lose weight.   Brand Name(s):Contrave   There may be other brand names for this medicine.  When This Medicine Should Not Be Used:   This medicine is not right for everyone. Do not use it if you had an  allergic reaction to naltrexone or bupropion, you are pregnant, or you have seizures, anorexia, or bulimia.  How to Use This Medicine:   Long Acting Tablet  ?? Take your medicine as directed. Your dose may need to be changed several times to find what works best for you.  ?? It is best to take this medicine with food or milk. However, do not take this medicine with high-fat meals. This may increase your risk of seizures.  ?? Swallow the extended-release tablet whole. Do not crush, break, or chew it.  ?? This medicine should come with a Medication Guide. Ask your pharmacist for a copy if you do not have one.  ?? Missed dose: Skip the missed dose and go back to your regular dosing schedule. Never take extra medicine to make up for a missed dose.  ?? Store the medicine in a closed container at room temperature, away from heat, moisture, and direct light.  Drugs and Foods to Avoid:   Ask your doctor or pharmacist before using any other  medicine, including over-the-counter medicines, vitamins, and herbal products.  ?? Do not use this medicine and an MAO inhibitor (MAOI) within 14 days of each other. Do not take this medicine if you are using or have used heroin or other narcotic drugs (such as buprenorphine, codeine, methadone, or other habit-forming painkillers) within the past 7 to 10 days. Do not use naltrexone/bupropion if you are also using Zyban?? to quit smoking or Aplenzin?? or Wellbutrin?? for depression, because they also contain bupropion.  ?? Some medicines and foods can affect how naltrexone/bupropion works. Tell your doctor if you are using any of the following:  ?? Amantadine, amiloride, cimetidine, clopidogrel, dopamine, famotidine, levodopa, memantine, metformin, metoprolol, oxaliplatin, pindolol, ranitidine, theophylline, ticlopidine, varenicline  ?? Insulin or diabetes medicine  ?? Medicine to treat depression  ?? Medicine to treat mental illness (haloperidol, risperidone, thioridazine)   ?? Medicine to treat heart rhythm problems (flecainide, procainamide, propafenone)  ?? Medicine to treat HIV or AIDS (efavirenz, lopinavir, ritonavir)  ?? Medicine for pain, diarrhea, cough, or colds  ?? Steroid (such as dexamethasone, hydrocortisone, methylprednisolone, prednisolone, prednisone)  ?? Limit alcohol, or do not drink alcohol at all while you are using this medicine.  Warnings While Using This Medicine:   ?? It is not safe to take this medicine during pregnancy. It could harm an unborn baby. Tell your doctor right away if you become pregnant.  ?? Tell your doctor if you have kidney disease, liver disease, diabetes, glaucoma, heart disease, or high blood pressure.  ?? Tell your doctor if you take barbiturates, benzodiazepines, antiseizure medicine, or sedatives, or if you recently stopped taking them. Tell your doctor if you have a history of drug addiction, or if you drink alcohol.  ?? For some children, teenagers, and young adults, this medicine may increase mental or emotional problems. This may lead to thoughts of suicide and violence. Talk with your doctor right away if you have any thoughts or behavior changes that concern you. Tell your doctor if you or anyone in your family has a history of bipolar disorder or suicide attempts.  ?? This medicine may cause the following problems:  ?? Increased risk of seizure  ?? High blood pressure or heart rate  ?? Serious allergic and skin reactions  ?? Liver problems  ?? Increased risk of hypoglycemia (low blood sugar) in patients with diabetes  ?? Do not breastfeed while you are using this medicine, unless your doctor says it is okay.  ?? You have a higher risk of accidental overdose, serious injury, or death if you use heroin or any other narcotic medicine while you are being treated with this medicine. Also, naltrexone prevents you from feeling the effects of heroin if you use it.  ?? Do not stop using this medicine suddenly. Your doctor will need to  slowly decrease your dose before you stop it completely.  ?? Tell any doctor or dentist who treats you that you are using this medicine. This medicine may affect certain medical test results.  ?? Your doctor will check your progress and the effects of this medicine at regular visits. Keep all appointments.  ?? Keep all medicine out of the reach of children. Never share your medicine with anyone.  Possible Side Effects While Using This Medicine:   Call your doctor right away if you notice any of these side effects:  ?? Allergic reaction: Itching or hives, swelling in your face or hands, swelling or tingling in your mouth or throat, chest tightness, trouble breathing  ??  Blistering, peeling, or red skin rash  ?? Chest pain, trouble breathing, fast, slow, or pounding heartbeat  ?? Dark urine or pale stools, nausea, vomiting, loss of appetite, stomach pain, yellow skin or eyes  ?? Eye pain, vision changes, seeing halos around lights  ?? Muscle or joint pain, fever with rash  ?? Seeing or hearing things that are not there, feeling like people are against you  ?? Seizures  ?? Sudden increase in energy, racing thoughts, trouble sleeping  ?? Thoughts of hurting yourself, worsening depression, severe agitation or confusion  If you notice these less serious side effects, talk with your doctor:   ?? Dry mouth  ?? Headache, dizziness  ?? Nausea, constipation, diarrhea  If you notice other side effects that you think are caused by this medicine, tell your doctor.   Call your doctor for medical advice about side effects. You may report side effects to FDA at 1-800-FDA-1088  ?? 2014 [place] is for End User's use only and may not be sold, redistributed or otherwise used for commercial purposes.  The above information is an educational aid only. It is not intended as medical advice for individual conditions or treatments. Talk to your doctor, nurse or pharmacist before following any medical regimen to see if  it is safe and effective for you.

## 2013-07-31 NOTE — Progress Notes (Signed)
HISTORY OF PRESENT ILLNESS  Katrina [name] is a 48 y.o. female presents with Medication Evaluation and Labs    Agree with nurse note.  Katrina [name] is here today to review her HDL labs. Please see scanned document for results.   She denies any specific concerns today.  She also reports that her mood has been more stable since her recent increase of dose of her Zoloft to 75mg  daily. She has an upcoming appointment with her counselor next week.   She does mention that she continues to struggle with motivation to work out and exercise, and continues to struggle with her weight.     ROS    Review of Systems negative except as noted above in HPI.    ALLERGIES:    Allergies   Allergen Reactions   ??? Aldactone [Spironolactone] Swelling   ??? Codeine Rash and Itching       CURRENT MEDICATIONS:    Current Outpatient Prescriptions   Medication Sig Dispense Refill   ??? sertraline (ZOLOFT) 50 mg tablet Take 1.5 Tabs by mouth daily. 45 Tab 3   ??? ergocalciferol (ERGOCALCIFEROL) 50,000 unit capsule Take 1 Cap by mouth every seven (7) days for 8 doses. 8 Cap 0   ??? levothyroxine (SYNTHROID) 75 mcg tablet Take 1 Tab by mouth Daily (before breakfast). 90 Tab 3    Lipitor 40mg  Take 0.5 tabs nightly     ??? LORazepam (ATIVAN) 0.5 mg tablet Take 1 Tab by mouth every eight (8) hours as needed for Anxiety. Max Daily Amount: 1.5 mg. 30 Tab 1   ??? nystatin-triamcinolone (MYCOLOG) 100,000-0.1 unit/gram-% ointment as needed.     ??? cyclobenzaprine (FLEXERIL) 10 mg tablet 10 mg as needed.     ??? b complex vitamins tablet Take 1 Tab by mouth daily.       ??? ascorbic acid (VITAMIN C) 500 mg tablet Take  by mouth.     ??? vitamin E (AQUA GEMS) 400 unit capsule Take 400 Units by mouth two (2) times a day.           PAST MEDICAL HISTORY:    Past Medical History   Diagnosis Date   ??? S/P tonsillectomy 07/25/2007   ??? S/P appendectomy 07/25/2007   ??? Obesity 07/25/2007     Improved with Lifestyle Modifications   ??? Hyperlipidemia 07/25/2007      Improving with weight loss   ??? Fatty liver 07/25/2007     In NASH study at [facility] (Dr. Hilary Hertz)   ??? Elevated triglycerides with high cholesterol 07/25/2007   ??? Depression 07/25/2007   ??? GE reflux 07/25/2007     Has improved with weight loss (has GI at [facility])   ??? HTN 07/25/2007     Well controlled with diet and exercise   ??? Hypothyroidism 07/25/2007     Last Check 10/2012, TSH 0.89, 04/13/13- 0.557 (decreased Synthroid 04/14/13)   ??? Pap smear for cervical cancer screening 08/02/10     [name])   ??? Hx of mammogram 7/12     MRI, 6 mo   ??? Adrenal adenoma 12/12/2009     Saw Nephrologist for this    ??? Chest pain, unspecified 12/12/2009     Was previously established with Dr. Sinclair Ship   ??? Diabetes mellitus, type 2 ([place])      Currently diet and exericse controlled, last A1c 5.2 (10/2012)   ??? Osteoarthritis of lumbar spine      MRI 12/14   ???  Osteoarthritis of thoracic spine      MRI 12/14, Stable   ??? 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 Vitamin D deficiency 06/15/2013       PAST SURGICAL HISTORY:    Past Surgical History   Procedure Laterality Date   ??? Pr appendectomy     ??? Hx tonsillectomy     ??? Hx breast reduction     ??? Hx cholecystectomy     ??? Pr abdomen surgery proc unlisted  08/01/09     laprascopic right adrenalectomy       FAMILY HISTORY:    Family History   Problem Relation Age of Onset   ??? Hypertension Mother    ??? Breast Cancer Mother 67   ??? Stroke Mother    ??? Diabetes Father    ??? Hypertension Father    ??? Stroke Sister 5     Carotid Artery Dissection       SOCIAL HISTORY:    History     Social History   ??? Marital Status: SINGLE     Spouse Name: Single     Number of Children: 0   ??? Years of Education: N/A     Occupational History   ??? [employer]     Social History Main Topics   ??? Smoking status: Former Smoker -- 0.30 packs/day for 10 years     Types: Cigarettes     Quit date: 02/08/2009   ??? Smokeless tobacco: Never Used   ??? Alcohol Use: 0.5 oz/week      1 Glasses of wine per week   ??? Drug Use: No   ??? Sexual Activity: Yes     Other Topics Concern   ??? Not on file     Social History Narrative       IMMUNIZATIONS:    Immunization History   Administered Date(s) Administered   ??? Influenza Vaccine Split 10/24/2010   ??? Pneumococcal Polysaccharide (PPV-23) 12/22/2010   ??? Pneumococcal Vaccine 12/05/2005   ??? TD Vaccine 10/23/2000   ??? TDAP Vaccine 10/24/2010         PHYSICAL EXAMINATION    Vital Signs  BP 143/83 mmHg   Pulse 70   Temp(Src) 99 ??F (37.2 ??C) (Oral)   Resp 16   Ht 5\' 5"  (1.651 m)   Wt 190 lb 12.8 oz (86.546 kg)   BMI 31.75 kg/m2   SpO2 98%   LMP 07/23/2013    General appearance - Well nourished. Well appearing.  Well developed.  No acute distress. Overweight.   Head - Normocephalic.  Atraumatic.    Eyes - Extraocular eye movements intact.   Ears - Hearing is grossly normal bilaterally.      Neurological - awake, alert. Cranial nerves II through XII grossly intact.  Clear speech.  Psychological -   normal behavior, dress and thought processes.  Good insight. Good eye contact.  Normal affect.  Appropriate mood.  Normal speech.    DATA REVIEWED    Results for orders placed or performed in visit on 05/25/13   VITAMIN D, 25 HYDROXY   Result Value Ref Range    VITAMIN D, 25-HYDROXY 27.2 (L) 30.0 - 100.0 ng/mL   T4, FREE   Result Value Ref Range    T4, Free 1.30 0.82 - 1.77 ng/dL   TSH, 3RD GENERATION   Result Value Ref Range    TSH 1.050 0.450 - 4.500 uIU/mL     ASSESSMENT and PLAN    Katrina [name] was seen  today for medication evaluation and labs.    Diagnoses and associated orders for this visit:    Idiopathic hypothyroidism  - TSH, 3RD GENERATION  - T4, FREE    MTHFR mutation (HCC)  - VITAMIN B12 & FOLATE  - METHYLMALONIC ACID    Hyperlipidemia    Overweight (BMI 25.0-29.9)    Depression with anxiety        Katrina [name] is overall doing well.   I reviewed her blood work with her in detail today.   I had recently changed her Thyroid dose in late May when she started to  have increased anxiety and depression symptoms. Will recheck her thyroid studies as she has been on her current dose now for over 2 months.   Also, in her lab work, the MTHFR gene was noted to have a mutation. Her homocysteine levels were normal, but MMA, Folate, and B12 studies were not included on her lab work. Will perform these as well today.   Her Lipoprofile testing has improved. Her LDL-P has significantly decreased, and her other total cholesterol panel overall looks great. She would like to try a trial off of her cholesterol medication.   We discussed that there are really no long term studies that show that Statin medication is beneficial for "decades" to prevent CV events.   She also has a history of Fatty liver disease, but her recent LFTs have been normal.   Will stop statin for now, and continue to monitor her cholesterol panel.     I inquired if she had ever thought about a weight loss medication to help improve or boost her weight loss efforts. She states she has not considered this in the past, but she is willing to think about it.   We discussed Qsymia, Belviq, and Contrave in detail. Provided patient education, and prescription savings cards. She will let me know what she decides.     Follow up with new PCP in 3 months for a check up.      Patient expresses understanding of treatment plan and agrees with recommendations.          Patient Instructions   Phentermine/Topiramate (By mouth)   Phentermine Hydrochloride (FEN-ter-meen hye-droe-KLOR-ide), Topiramate (toe-PIR-a-mate)  Used with diet and exercise to help you reach and maintain a healthy weight.   Brand Name(s):Qsymia   There may be other brand names for this medicine.  When This Medicine Should Not Be Used:   This medicine is not right for everyone. Do not use it if you had an allergic reaction to phentermine or topiramate, you are pregnant, or if you have glaucoma or an overactive thyroid.  How to Use This Medicine:   Long Acting Capsule   ?? Take your medicine as directed. Your dose may need to be changed several times to find what works best for you. Carefully follow your doctor's instructions about diet and exercise.  ?? Take this medicine in the morning, with or without food. It might keep you awake if you take it at night.  ?? Swallow the extended-release capsule whole. Do not crush, break, or chew it.  ?? Drink extra fluids so you will urinate more often and help prevent kidney problems.  ?? This medicine should come with a Medication Guide. Ask your pharmacist for a copy if you do not have one.  ?? Missed dose: If you miss a dose or forget to take your medicine, skip the missed dose. Take your regular dose the next morning. Do  not take extra medicine to make up for the missed dose.  ?? Store the medicine in a closed container at room temperature, away from heat, moisture, and direct light.  Drugs and Foods to Avoid:   Ask your doctor or pharmacist before using any other medicine, including over-the-counter medicines, vitamins, and herbal products.  ?? Do not use this medicine if you have used an MAO inhibitor (MAOI) within the past 14 days.  ?? Some medicines can affect how phentermine/topiramate works. Tell your doctor if you are using the following:  ?? Acetazolamide, dichlorphenamide, methazolamide, zonisamide  ?? Birth control pills  ?? Other diet pills (including nonprescription or herbal products)  ?? Diuretic (water pill)  ?? Medicine to treat seizures (such as valproic acid, carbamazepine, phenytoin)  ?? Tell your doctor if you use anything else that makes you sleepy. Some examples are allergy medicine, narcotic pain medicine, and alcohol.  ?? Do not drink alcohol while you are using this medicine.  Warnings While Using This Medicine:   ?? It is not safe to take this medicine during pregnancy. It could harm an unborn baby. Tell your doctor right away if you become pregnant. You should have a negative pregnancy test before you start taking this  medicine and every month during treatment. Tell your doctor right away if you miss a period.  ?? Tell your doctor if you are breastfeeding, or if you have kidney disease, liver disease, diabetes, heart failure, heart rhythm problems, or a history of stroke or heart attack.  ?? Rarely, this medicine may increase the risk of suicidal thoughts or actions. Tell your doctor if you have a history of depression or suicide attempts.  ?? This medicine could cause the following problems:   ?? Vision problems or glaucoma  ?? Metabolic acidosis (too much acid in the blood)  ?? An increased heart rate  ?? Changes in blood sugar levels  ?? Kidney stones  ?? This medicine may cause you to feel dizzy, drowsy, or confused, or to have trouble thinking or speaking. Do not drive or do anything else that could be dangerous until you know how this medicine affects you.  ?? Do not stop using this medicine suddenly. Your doctor will need to slowly decrease your dose before you stop it completely. You might have a seizure if you stop taking the medicine too fast.  ?? Your doctor will do lab tests at regular visits to check on the effects of this medicine. Keep all appointments.  ?? Keep all medicine out of the reach of children. Never share your medicine with anyone.  Possible Side Effects While Using This Medicine:   Call your doctor right away if you notice any of these side effects:  ?? Allergic reaction: Itching or hives, swelling in your face or hands, swelling or tingling in your mouth or throat, chest tightness, trouble breathing  ?? Bloody or dark urine, sudden back pain, stomach pain, painful urination  ?? Decreased sweating, fever, or feeling hot  ?? Dry mouth, increased thirst, muscle cramps, nausea or vomiting  ?? Eye pain, vision changes, seeing halos around lights  ?? Fast breathing, loss of appetite, unusual tiredness  ?? Fast, pounding, or uneven heartbeat  ?? Lightheadedness, dizziness, or fainting   ?? Problems with speech or memory, trouble concentrating, confusion  ?? Seizures  ?? Thoughts of hurting yourself, depression, anxiety, trouble sleeping  If you notice these less serious side effects, talk with your doctor:   ?? Constipation  ??  Metallic taste in your mouth, dry mouth  ?? Numbness, tingling, or burning pain in your hands, arms, legs, or feet  If you notice other side effects that you think are caused by this medicine, tell your doctor.   Call your doctor for medical advice about side effects. You may report side effects to FDA at 1-800-FDA-1088  ?? 2014 [place] is for End User's use only and may not be sold, redistributed or otherwise used for commercial purposes.  The above information is an educational aid only. It is not intended as medical advice for individual conditions or treatments. Talk to your doctor, nurse or pharmacist before following any medical regimen to see if it is safe and effective for you.  Naltrexone/Bupropion (By mouth)   Bupropion Hydrochloride (bue-PROE-pee-on hye-droe-KLOR-ide), Naltrexone Hydrochloride (nal-TREX-one hye-droe-KLOR-ide)  Used with diet and exercise to help you lose weight.   Brand Name(s):Contrave   There may be other brand names for this medicine.  When This Medicine Should Not Be Used:   This medicine is not right for everyone. Do not use it if you had an allergic reaction to naltrexone or bupropion, you are pregnant, or you have seizures, anorexia, or bulimia.  How to Use This Medicine:   Long Acting Tablet  ?? Take your medicine as directed. Your dose may need to be changed several times to find what works best for you.  ?? It is best to take this medicine with food or milk. However, do not take this medicine with high-fat meals. This may increase your risk of seizures.  ?? Swallow the extended-release tablet whole. Do not crush, break, or chew it.  ?? This medicine should come with a Medication Guide. Ask your pharmacist  for a copy if you do not have one.  ?? Missed dose: Skip the missed dose and go back to your regular dosing schedule. Never take extra medicine to make up for a missed dose.  ?? Store the medicine in a closed container at room temperature, away from heat, moisture, and direct light.  Drugs and Foods to Avoid:   Ask your doctor or pharmacist before using any other medicine, including over-the-counter medicines, vitamins, and herbal products.  ?? Do not use this medicine and an MAO inhibitor (MAOI) within 14 days of each other. Do not take this medicine if you are using or have used heroin or other narcotic drugs (such as buprenorphine, codeine, methadone, or other habit-forming painkillers) within the past 7 to 10 days. Do not use naltrexone/bupropion if you are also using Zyban?? to quit smoking or Aplenzin?? or Wellbutrin?? for depression, because they also contain bupropion.  ?? Some medicines and foods can affect how naltrexone/bupropion works. Tell your doctor if you are using any of the following:  ?? Amantadine, amiloride, cimetidine, clopidogrel, dopamine, famotidine, levodopa, memantine, metformin, metoprolol, oxaliplatin, pindolol, ranitidine, theophylline, ticlopidine, varenicline  ?? Insulin or diabetes medicine  ?? Medicine to treat depression  ?? Medicine to treat mental illness (haloperidol, risperidone, thioridazine)  ?? Medicine to treat heart rhythm problems (flecainide, procainamide, propafenone)  ?? Medicine to treat HIV or AIDS (efavirenz, lopinavir, ritonavir)  ?? Medicine for pain, diarrhea, cough, or colds  ?? Steroid (such as dexamethasone, hydrocortisone, methylprednisolone, prednisolone, prednisone)  ?? Limit alcohol, or do not drink alcohol at all while you are using this medicine.  Warnings While Using This Medicine:   ?? It is not safe to take this medicine during pregnancy. It could harm an unborn baby.  Tell your doctor right away if you become pregnant.   ?? Tell your doctor if you have kidney disease, liver disease, diabetes, glaucoma, heart disease, or high blood pressure.  ?? Tell your doctor if you take barbiturates, benzodiazepines, antiseizure medicine, or sedatives, or if you recently stopped taking them. Tell your doctor if you have a history of drug addiction, or if you drink alcohol.  ?? For some children, teenagers, and young adults, this medicine may increase mental or emotional problems. This may lead to thoughts of suicide and violence. Talk with your doctor right away if you have any thoughts or behavior changes that concern you. Tell your doctor if you or anyone in your family has a history of bipolar disorder or suicide attempts.  ?? This medicine may cause the following problems:  ?? Increased risk of seizure  ?? High blood pressure or heart rate  ?? Serious allergic and skin reactions  ?? Liver problems  ?? Increased risk of hypoglycemia (low blood sugar) in patients with diabetes  ?? Do not breastfeed while you are using this medicine, unless your doctor says it is okay.  ?? You have a higher risk of accidental overdose, serious injury, or death if you use heroin or any other narcotic medicine while you are being treated with this medicine. Also, naltrexone prevents you from feeling the effects of heroin if you use it.  ?? Do not stop using this medicine suddenly. Your doctor will need to slowly decrease your dose before you stop it completely.  ?? Tell any doctor or dentist who treats you that you are using this medicine. This medicine may affect certain medical test results.  ?? Your doctor will check your progress and the effects of this medicine at regular visits. Keep all appointments.  ?? Keep all medicine out of the reach of children. Never share your medicine with anyone.  Possible Side Effects While Using This Medicine:   Call your doctor right away if you notice any of these side effects:   ?? Allergic reaction: Itching or hives, swelling in your face or hands, swelling or tingling in your mouth or throat, chest tightness, trouble breathing  ?? Blistering, peeling, or red skin rash  ?? Chest pain, trouble breathing, fast, slow, or pounding heartbeat  ?? Dark urine or pale stools, nausea, vomiting, loss of appetite, stomach pain, yellow skin or eyes  ?? Eye pain, vision changes, seeing halos around lights  ?? Muscle or joint pain, fever with rash  ?? Seeing or hearing things that are not there, feeling like people are against you  ?? Seizures  ?? Sudden increase in energy, racing thoughts, trouble sleeping  ?? Thoughts of hurting yourself, worsening depression, severe agitation or confusion  If you notice these less serious side effects, talk with your doctor:   ?? Dry mouth  ?? Headache, dizziness  ?? Nausea, constipation, diarrhea  If you notice other side effects that you think are caused by this medicine, tell your doctor.   Call your doctor for medical advice about side effects. You may report side effects to FDA at 1-800-FDA-1088  ?? 2014 [place] is for End User's use only and may not be sold, redistributed or otherwise used for commercial purposes.  The above information is an educational aid only. It is not intended as medical advice for individual conditions or treatments. Talk to your doctor, nurse or pharmacist before following any medical regimen to see if it is safe and  effective for you.          Brain Hilts, DO

## 2013-08-03 LAB — METHYLMALONIC ACID: METHYLMALONIC ACID, SERUM: 168 nmol/L (ref 0–378)

## 2013-08-03 LAB — TSH 3RD GENERATION: TSH: 1.21 u[IU]/mL (ref 0.450–4.500)

## 2013-08-03 LAB — VITAMIN B12 & FOLATE
Folate: 10.7 ng/mL (ref >3.0)
Vitamin B12: 452 pg/mL (ref 211–946)

## 2013-08-03 LAB — T4, FREE: T4, Free: 1.34 ng/dL (ref 0.82–1.77)

## 2013-08-03 NOTE — Progress Notes (Signed)
Quick Note:        Will discuss with patient at her appointment tomorrow.    ______

## 2013-08-04 ENCOUNTER — Encounter

## 2013-08-04 NOTE — Progress Notes (Signed)
Chief Complaint   Patient presents with   ??? Osteopathic Manipulation Treatment   ??? Back Pain   ??? Shoulder Pain   ??? Neck Pain     1. Have you been to the ER, urgent care clinic since your last visit?  Hospitalized since your last visit?No    2. Have you seen or consulted any other health care providers outside of the [facility] since your last visit?  Include any pap smears or colon screening. Yes When: 08/03/13 Where: [facility]

## 2013-08-04 NOTE — Progress Notes (Signed)
HPI    Katrina [name] is a 48 y.o. female presents to our office to discuss her back pain today.       [name] has been experiencing left hip pain since she ran in the marathon on 11/22/12. The pain is much improved, however.    She states that occasionally, she will feel a pulling in her left IT band distribution during her runs, especially her long runs.  She has been to PT in the past for tight hip flexors.   Her Most recent MRIs were in 12/2012 for both thoracic and Lumbar Spine. They revealed some mild arthritis, but were otherwise normal.     Since her last OMT session, she reports: she was sore after the treatment. Her stiffness in her back/upper back is present today where she carries her stress.   She reports the last visit was great. She has noticed an improvement in her range of motion. She is still doing Pilates, and is finding that she is more flexible as well.   She has some increased tightness of her neck and shoulders, and some left lateral hip pain that radiates down to her posterior knee in her IT band distribution. This has improved since her last OMT session. Her upper back seems to be bothering her the most today.   She denies any weakness of her left leg, or numbness, or tingling, saddle esthesia, urinary/bowel retention or incontinence.   Sitting for long periods of times makes the effected area of her buttocks very stiff, as well as prolonged standing. Moving around does alleviate her pain. She admits she is not using her "foam roller" for stretching as much recently. She did that yesterday, and that could be why she feels so stiff today.     As far as her mental health, she states she is currently stable. We had increased her Zoloft to 75 mg daily on Friday 07/31/13. She has also recently re-established with her counselor, and has an other meeting coming up in the beginning of August. She has not taken her Ativan for  over a week. She finds that motivation is still an issue, and she is not exercising like she was in the recent past. She denies any concern for self harm, and feels safe.     I also reviewed her most recent lab work with her today.     Agree with nurse history.   Current Outpatient Prescriptions   Medication Sig Dispense Refill   ??? sertraline (ZOLOFT) 50 mg tablet Take 1.5 Tabs by mouth daily. 45 Tab 3   ??? ergocalciferol (ERGOCALCIFEROL) 50,000 unit capsule Take 1 Cap by mouth every seven (7) days for 8 doses. 8 Cap 0   ??? levothyroxine (SYNTHROID) 75 mcg tablet Take 1 Tab by mouth Daily (before breakfast). 90 Tab 3   ??? LORazepam (ATIVAN) 0.5 mg tablet Take 1 Tab by mouth every eight (8) hours as needed for Anxiety. Max Daily Amount: 1.5 mg. 30 Tab 1   ??? nystatin-triamcinolone (MYCOLOG) 100,000-0.1 unit/gram-% ointment as needed.     ??? cyclobenzaprine (FLEXERIL) 10 mg tablet 10 mg as needed.     ??? b complex vitamins tablet Take 1 Tab by mouth daily.       ??? ascorbic acid (VITAMIN C) 500 mg tablet Take  by mouth.     ??? vitamin E (AQUA GEMS) 400 unit capsule Take 400 Units by mouth two (2) times a day.  Past Medical History   Diagnosis Date   ??? S/P tonsillectomy 07/25/2007   ??? S/P appendectomy 07/25/2007   ??? Obesity 07/25/2007     Improved with Lifestyle Modifications   ??? Hyperlipidemia 07/25/2007     Improving with weight loss   ??? Fatty liver 07/25/2007     In NASH study at [facility] (Dr. Hilary Hertz)   ??? Elevated triglycerides with high cholesterol 07/25/2007   ??? Depression 07/25/2007   ??? GE reflux 07/25/2007     Has improved with weight loss (has GI at [facility])   ??? HTN 07/25/2007     Well controlled with diet and exercise   ??? Hypothyroidism 07/25/2007     Last Check 10/2012, TSH 0.89, 04/13/13- 0.557 (decreased Synthroid 04/14/13)   ??? Pap smear for cervical cancer screening 08/02/10     [name])   ??? Hx of mammogram 7/12     MRI, 6 mo   ??? Adrenal adenoma 12/12/2009      Saw Nephrologist for this    ??? Chest pain, unspecified 12/12/2009     Was previously established with Dr. Sinclair Ship   ??? Diabetes mellitus, type 2 ([place])      Currently diet and exericse controlled, last A1c 5.2 (10/2012)   ??? Osteoarthritis of lumbar spine      MRI 12/14   ??? Osteoarthritis of thoracic spine      MRI 12/14, Stable   ??? 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 Vitamin D deficiency 06/15/2013         ROS  Ivar Drape denies headache, vision changes, SOB, chest pain or tightness, abdominal pain, fecal or new urinary incontinence, weakness, gait change, numbness, tingling, swelling, and skin changes.  ROS is otherwise negative.    PHYSICAL EXAMINATION  BP 141/93 mmHg   Pulse 55   Temp(Src) 98.8 ??F (37.1 ??C) (Oral)   Resp 18   Ht 5\' 5"  (1.651 m)   Wt 190 lb (86.183 kg)   BMI 31.62 kg/m2   SpO2 98%   LMP 07/23/2013    General appearance - Well nourished. Well appearing.  Well developed.  Mild discomfort noted with position change. Overweight.  Cooperative.  Difficulty relaxing.  Head - normocephalic.  Atraumatic. Increased occipital muscle tension noted.    Neck - Noted to be flexed, rotated left, sidebent right.   Heme/Lymph - peripheral pulses normal x 4 extremities.  No peripheral edema is noted.  Skin - no rashes, erythema, ecchymosis, lacerations, abrasions  Chest - Thoracic inlet rotated to the left. Elevated first rib on the left.   Back exam - Full range of motion, with increased tightness appreciated in flexion.  Increased muscle tension in the paravertebral musculature in the occipital, cervical, thoracic, lumbar and sacral regions.  No pain on palpation of the spinous processes in the cervical, thoracic, lumbar, sacral regions.  No CVA tenderness.  Pain noted with palpation to paraspinal muscles of the cervical, upper thoracic, and lumbar spine with associated muscle spasm.  Pelvis -   Pelvic rotation to the right noted.   Sacrum - Decreased Spring.  Sacrum is rotated to the left.   Neurological - awake, alert and oriented to person, place, and time and event.  Cranial nerves II through XII intact  Normal speech.  No focal findings.  Muscle strength is +5/5 x 4 extremities.  Sensation is intact to light touch bilaterally.  Steady gait.  Negative Straight Leg Test bilaterally.  Able to  walk on heels and toes.   Musculoskeletal - Intact x 4 extremities.  Full ROM x 4 extremities.  No pain with movement.  No pain on palpation of the bilateral shoulders, elbows, wrists, hands.  No tenderness in the pelvis, pubic bone, bilateral hips, knees, ankles.  Tenderness noted with palpation of hip flexors bilaterally. No obvious deformity or swelling.  Tight hamstrings, iliopsoas muscles, and IT bands noted bilaterally. Spasming left and right Piriformis Muscles. Decreased bilateral scapula movement.     OMT Procedure was discussed with the patient. Verbal consent was received.  Osteopathic Manipulation Therapy was performed during this visit.   Procedure Note:   Muscle Energy: Location:   lumbars: Result:   good release and increased symmetry and ROM after treatment, pelvis: Result:   good release and increased symmetry and ROM after treatment and lower extremities: Result:   good release and increased symmetry and ROM after treatment, and Cervicals: Result:   good release and increased symmetry and ROM after treatment  Myofascial Releases: Location:   Cervicals: Result:   good release and increased symmetry after treatment,  thoracics: Result:   good release and increased symmetry after treatment, lumbars: Result: good release and increased symmetry after treatment and sacral: Result: good release and increased symmetry after treatment. Scapula: Result: good release and increased symmetry after treatment  Counterstrain: Location:   Left and Right Piriformis - lower extremities: Result:  More than 50% improvment after treatment  Pubic Release: Result: reset pelvis and sacrum   Myofascial Stretching : Result:  decreased muscle tension  Sacral Compression: Result: increase sacral motility  Direct pressure on Right elevated first rib  Suboccipital release, Result: Decreased muscle tension  Reset Thoracic inlet and Diaphragm: result: increased ROM and symmetry after treatment.     ASSESSMENT/PLAN  Advised patient:   1. Do not self adjust.  2. Increase water intake.  3. May be more uncomfortable the first 24-48 hours after receiving OMT. Use OTC Tylenol Arthritis or Ibuprofen up to 800mg  with food every 8 hours as needed for  pain.  4. Avoid lifting, pushing or pulling more than 5 to 10 pounds until better.   5. Avoid prolonged sitting, standing, bending, reaching or lying down until better.  6. Use moist heat as needed for 20 minutes several times daily. Can alternate with ice.  7. Do exercises as demonstrated.  Refer to handouts provided.  8. Avoid triggering activities.  9. WATCH YOUR POSTURE.  10. Return to our office for a follow up visit 2-4  weeks or sooner if symptoms worsen or persist.      Ms. Hataway continues to suffer from her Depression and anxiety, but she feels like she is currently stable or even slightly improved.   I also encouraged her to continue care with her counselor. She denies any concerns for SI/HI.   Provided support and encouragement.   Provided Dr. Vennie Homans office information as he does perform OMT.     Encouraged her to schedule a follow up visit to meet her new PCP in the next few months.     Brain Hilts, DO

## 2013-08-04 NOTE — Patient Instructions (Signed)
Relaxation techniques   Do relaxation exercises for 10 to 20 minutes a day. You can play soothing, relaxing music while you do them, if you wish.   Tell others in your house that you are going to do your relaxation exercises. Ask them not to disturb you.   Find a comfortable place, away from all distractions and noise.   Lie down on your back, or sit with your back straight.   Focus on your breathing. Make it slow and steady.   Breathe in through your nose. Breathe out through either your nose or mouth.   Breathe deeply, filling up the area between your navel and your rib cage. Breathe so that your belly goes up and down.   Do not hold your breath.   Breathe like this for 5 to 10 minutes. Notice the feeling of calmness throughout your whole body.  As you continue to breathe slowly and deeply, relax by doing the following for another 5 to 10 minutes:   Tighten and relax each muscle group in your body. You can begin at your toes and work your way up to your head.   Imagine your muscle groups relaxing and becoming heavy.   Empty your mind of all thoughts.   Let yourself relax more and more deeply.   Become aware of the state of calmness that surrounds you.   When your relaxation time is over, you can bring yourself back to alertness by moving your fingers and toes and then your hands and feet and then stretching and moving your entire body. Sometimes people fall asleep during relaxation, but they usually wake up shortly afterward.   Always give yourself time to return to full alertness before you drive a car or do anything that might cause an accident if you are not fully alert. Never play a relaxation tape while driving a car.  When should you call for help?   Call 911 anytime you think you may need emergency care. For example, call if:   You feel you cannot stop from hurting yourself or someone else.  Watch closely for changes in your health, and be sure to contact your doctor if:   Your panic attacks get worse.    You have new or different anxiety.   You are not getting better as expected.

## 2013-11-09 ENCOUNTER — Ambulatory Visit
Admit: 2013-11-09 | Discharge: 2013-11-09 | Payer: PRIVATE HEALTH INSURANCE | Attending: Family Medicine | Primary: Family Medicine

## 2013-11-09 DIAGNOSIS — I10 Essential (primary) hypertension: Secondary | ICD-10-CM

## 2013-11-09 MED ORDER — SERTRALINE 50 MG TAB
50 mg | ORAL_TABLET | Freq: Every day | ORAL | Status: DC
Start: 2013-11-09 — End: 2014-02-26

## 2013-11-09 MED ORDER — LORAZEPAM 0.5 MG TAB
0.5 mg | ORAL_TABLET | Freq: Three times a day (TID) | ORAL | Status: DC | PRN
Start: 2013-11-09 — End: 2014-09-17

## 2013-11-09 MED ORDER — METFORMIN SR 500 MG 24 HR TABLET
500 mg | ORAL_TABLET | Freq: Two times a day (BID) | ORAL | Status: DC
Start: 2013-11-09 — End: 2014-12-03

## 2013-11-09 NOTE — Progress Notes (Signed)
Patient presents to office to Establish Care.  Needs medication refill.  Fasting today for lab work.    Referral follow up:  [place] - 10/08/13  Dr Patience Musca - Gastroenterology - 06/2013  Dr Gerre Pebbles - OBGYN - 08/2013    1. Have you been to the ER, urgent care clinic since your last visit?  Hospitalized since your last visit?No    2. Have you seen or consulted any other health care providers outside of the [facility] since your last visit?  Include any pap smears or colon screening.  Yes - see above documentation

## 2013-11-09 NOTE — Patient Instructions (Signed)
Anxiety Disorder: After Your Visit  Your Care Instructions  Anxiety is a normal reaction to stress. Difficult situations can cause you to have symptoms such as sweaty palms and a nervous feeling.  In an anxiety disorder, the symptoms are far more severe. Constant worry, muscle tension, trouble sleeping, nausea and diarrhea, and other symptoms can make normal daily activities difficult or impossible. These symptoms may occur for no reason, and they can affect your work, school, or social life. Medicines, counseling, and self-care can all help.  Follow-up care is a key part of your treatment and safety. Be sure to make and go to all appointments, and call your doctor if you are having problems. It's also a good idea to know your test results and keep a list of the medicines you take.  How can you care for yourself at home?  ?? Take medicines exactly as directed. Call your doctor if you think you are having a problem with your medicine.  ?? Go to your counseling sessions and follow-up appointments.  ?? Recognize and accept your anxiety. Then, when you are in a situation that makes you anxious, say to yourself, "This is not an emergency. I feel uncomfortable, but I am not in danger. I can keep going even if I feel anxious."  ?? Be kind to your body:  ?? Relieve tension with exercise or a massage.  ?? Get enough rest.  ?? Avoid alcohol, caffeine, nicotine, and illegal drugs. They can increase your anxiety level and cause sleep problems.  ?? Learn and do relaxation techniques. See below for more about these techniques.  ?? Engage your mind. Get out and do something you enjoy. Go to a funny movie, or take a walk or hike. Plan your day. Having too much or too little to do can make you anxious.  ?? Keep a record of your symptoms. Discuss your fears with a good friend or family member, or join a support group for people with similar problems. Talking to others sometimes relieves stress.   ?? Get involved in social groups, or volunteer to help others. Being alone sometimes makes things seem worse than they are.  ?? Get at least 30 minutes of exercise on most days of the week to relieve stress. Walking is a good choice. You also may want to do other activities, such as running, swimming, cycling, or playing tennis or team sports.  Relaxation techniques  Do relaxation exercises 10 to 20 minutes a day. You can play soothing, relaxing music while you do them, if you wish.  ?? Tell others in your house that you are going to do your relaxation exercises. Ask them not to disturb you.  ?? Find a comfortable place, away from all distractions and noise.  ?? Lie down on your back, or sit with your back straight.  ?? Focus on your breathing. Make it slow and steady.  ?? Breathe in through your nose. Breathe out through either your nose or mouth.  ?? Breathe deeply, filling up the area between your navel and your rib cage. Breathe so that your belly goes up and down.  ?? Do not hold your breath.  ?? Breathe like this for 5 to 10 minutes. Notice the feeling of calmness throughout your whole body.  As you continue to breathe slowly and deeply, relax by doing the following for another 5 to 10 minutes:  ?? Tighten and relax each muscle group in your body. You can begin at your toes and work   your way up to your head.  ?? Imagine your muscle groups relaxing and becoming heavy.  ?? Empty your mind of all thoughts.  ?? Let yourself relax more and more deeply.  ?? Become aware of the state of calmness that surrounds you.  ?? When your relaxation time is over, you can bring yourself back to alertness by moving your fingers and toes and then your hands and feet and then stretching and moving your entire body. Sometimes people fall asleep during relaxation, but they usually wake up shortly afterward.  ?? Always give yourself time to return to full alertness before you drive a  car or do anything that might cause an accident if you are not fully alert. Never play a relaxation tape while you drive a car.  When should you call for help?  Call 911 anytime you think you may need emergency care. For example, call if:  ?? You feel you cannot stop from hurting yourself or someone else.  Watch closely for changes in your health, and be sure to contact your doctor if:  ?? You have anxiety or fear that affects your life.  ?? You have symptoms of anxiety that are new or different from those you had before.   Where can you learn more?   Go to [email or web address]  Enter P754 in the search box to learn more about "Anxiety Disorder: After Your Visit."   ?? 2006-2015 [employer]. Care instructions adapted under license by [employer] (which disclaims liability or warranty for this information). This care instruction is for use with your licensed healthcare professional. If you have questions about a medical condition or this instruction, always ask your healthcare professional. [place] any warranty or liability for your use of this information.  Content Version: 10.5.422740; Current as of: November 21, 2012              Preventing a Relapse of Depression: After Your Visit  Your Care Instructions  A relapse of depression means your symptoms have come back after you have gotten better. This illness often comes and goes during a lifetime. But there are many things you can do to keep it from coming back.  Follow-up care is a key part of your treatment and safety. Be sure to make and go to all appointments, and call your doctor if you are having problems. It's also a good idea to know your test results and keep a list of the medicines you take.  What do you need to know?  Know your risk of relapse  Talk to your doctor to find out if you are at risk of relapse. Many things can make a person more likely to relapse into depression. These include  having a family member with depression, dealing with serious problems in a relationship or a job, having a serious medical condition, or abusing drugs or alcohol.  It is important to know your risk and to recognize warning signs of relapse. Once you know these things, you will be better able to keep it from happening to you.  Know the warning signs of relapse  The two most common signs of relapse are:  ?? Feeling sad or hopeless.  ?? Losing interest in your daily activities.  You may have other symptoms, such as:  ?? You lose or gain weight.  ?? You sleep too much or not enough.  ?? You feel restless and unable to sit still.  ?? You feel unable to move.  ??  You feel tired all the time.  ?? You feel unworthy or guilty without an obvious reason.  ?? You have problems concentrating, remembering, or making decisions.  ?? You think often about death or suicide.  ?? You feel angry or have panic attacks.  How can you care for yourself at home?  ?? Take your medicine as prescribed. Call your doctor if you have any problems with your medicine. Many people take their medicines for at least 6 months after they have recovered. This often helps keep symptoms from coming back. However, if your depression keeps coming back, you may have to take medicine for the rest of your life.  ?? Continue counseling even after you have stopped taking medicine.  ?? Eat healthy foods. Include fruits, vegetables, beans, and whole grains in your diet each day.  ?? Get at least 30 minutes of exercise on most days of the week. Walking is a good choice. You also may want to do other activities, such as running, swimming, cycling, or playing tennis or team sports.  ?? See your doctor right away if you have new symptoms or feel that your depression is coming back.  ?? Keep a regular sleep schedule. Try for 8 hours of sleep a night.  ?? Avoid alcohol and illegal drugs.  ?? Keep the numbers for these national suicide hotlines: 1-800-273-TALK  669 262 7636) and 1-800-SUICIDE 480-082-0418). If you or someone you know talks about suicide or feeling hopeless, get help right away.  When should you call for help?  Call 911 anytime you think you may need emergency care. For example, call if:  ?? You are thinking about suicide or are threatening suicide.  ?? You feel you cannot stop from hurting yourself or someone else.  ?? You hear or see things that aren't real.  ?? You think or speak in a bizarre way that is not like your usual behavior.  Call your doctor now or seek immediate medical care if:  ?? You are drinking a lot of alcohol or using illegal drugs.  ?? You are talking or writing about death.  Watch closely for changes in your health, and be sure to contact your doctor if:  ?? You find it hard or it's getting harder to deal with school, a job, family, or friends.  ?? You think your treatment is not helping or you are not getting better.  ?? Your symptoms get worse or you get new symptoms.  ?? You have any problems with your antidepressant medicines, such as side effects, or you are thinking about stopping your medicine.  ?? You are having manic behavior, such as having very high energy, needing less sleep than normal, or showing risky behavior such as spending money you don't have or abusing others verbally or physically.   Where can you learn more?   Go to [email or web address]  Enter C630 in the search box to learn more about "Preventing a Relapse of Depression: After Your Visit."   ?? 2006-2015 [employer], Inc[employer]. Care instructions adapted under license by [employer] (which disclaims liability or warranty for this information). This care instruction is for use with your licensed healthcare professional. If you have questions about a medical condition or this instruction, always ask your healthcare professional. [place] any warranty or liability for your use of this information.   Content Version: 10.5.422740; Current as of: November 21, 2012              Learning About CPAP for Sleep  Apnea  What is CPAP?     CPAP is a small machine that you use at home every night while you sleep. It increases air pressure in your throat to keep your airway open. When you have sleep apnea, this can help you sleep better so you feel much better. CPAP stands for "continuous positive airway pressure."  The CPAP machine will have one of the following:  ?? A mask that covers your nose and mouth  ?? Prongs that fit into your nose  ?? A mask that covers your nose only, the most common type. This type is called NCPAP. The N stands for "nasal."  Why is it done?  CPAP is usually the best treatment for obstructive sleep apnea. It is the first treatment choice and the most widely used. Your doctor may suggest CPAP if you have:  ?? Moderate to severe sleep apnea.  ?? Sleep apnea and coronary artery disease (CAD) or heart failure.  How does it help?  ?? CPAP can help you have more normal sleep, so you feel less sleepy and more alert during the daytime.  ?? CPAP may help keep heart failure or other heart problems from getting worse.  ?? CPAP may help lower your blood pressure.  ?? If you use CPAP, your bed partner may also sleep better because you are not snoring or restless.  What are the side effects?  Some people who use CPAP have:  ?? A dry or stuffy nose and a sore throat.  ?? Irritated skin on the face.  ?? Sore eyes.  ?? Bloating.  If you have any of these problems, work with your doctor to fix them. Here are some things you can try:  ?? Be sure the mask or nasal prongs fit well.  ?? See if your doctor can adjust the pressure of your CPAP.  ?? If your nose is dry, try a humidifier.  ?? If your nose is runny or stuffy, try decongestant medicine or a steroid nasal spray. Be safe with medicines. Read and follow all instructions on the label. Do not use the medicine longer than the label says.   If these things do not help, you might try a different type of machine. Some machines have air pressure that adjusts on its own. Others have air pressures that are different when you breathe in than when you breathe out. This may reduce discomfort caused by too much pressure in your nose.   Where can you learn more?   Go to [email or web address]  Enter X266 in the search box to learn more about "Learning About CPAP for Sleep Apnea."   ?? 2006-2015 Health[employer], [employer]. Care instructions adapted under license by [employer] (which disclaims liability or warranty for this information). This care instruction is for use with your licensed healthcare professional. If you have questions about a medical condition or this instruction, always ask your healthcare professional. [place] any warranty or liability for your use of this information.  Content Version: 10.5.422740; Current as of: September 16, 2012              Self-Care While You Recover From Depression: After Your Visit  Your Care Instructions  Taking good care of yourself is important as you recover from depression. In time, your symptoms will fade as your treatment takes hold. Do not give up. Instead, focus your energy on getting better.  Your mood will improve. It just takes some time. Focus on things that can help you  feel better, such as being with friends and family, eating well, and getting enough rest. But take things slowly. Do not do too much too soon. You will begin to feel better gradually.  Follow-up care is a key part of your treatment and safety. Be sure to make and go to all appointments, and call your doctor if you are having problems. It's also a good idea to know your test results and keep a list of the medicines you take.  How can you care for yourself at home?  Be realistic  ?? If you have a large task to do, break it up into smaller steps you can handle, and just do what you can.   ?? You may want to put off important decisions until your depression has lifted. If you have plans that will have a major impact on your life, such as marriage, divorce, or a job change, try to wait a bit. Talk it over with friends and loved ones who can help you look at the overall picture first.  ?? Reaching out to people for help is important. Do not isolate yourself. Let your family and friends help you. Find someone you can trust and confide in, and talk to that person.  ?? Be patient, and be kind to yourself. Remember that depression is not your fault and is not something you can overcome with willpower alone. Treatment is necessary for depression, just like for any other illness. Feeling better takes time, and your mood will improve little by little.  Stay active  ?? Stay busy and get outside. Take a walk, or try some other light exercise.  ?? Talk with your doctor about an exercise program. Exercise can help with mild depression.  ?? Go to a movie or concert. Take part in a church activity or other social gathering. Go to a ball game.  ?? Ask a friend to have dinner with you.  Take care of yourself  ?? Eat a balanced diet with plenty of fresh fruits and vegetables, whole grains, and lean protein. If you have lost your appetite, eat small snacks rather than large meals.  ?? Avoid drinking alcohol or using illegal drugs. Do not take medicines that have not been prescribed for you. They may interfere with medicines you may be taking for depression, or they may make your depression worse.  ?? Take your medicines exactly as they are prescribed. You may start to feel better within 1 to 3 weeks of taking antidepressant medicine. But it can take as many as 6 to 8 weeks to see more improvement. If you have questions or concerns about your medicines, or if you do not notice any improvement by 3 weeks, talk to your doctor.  ?? If you have any side effects from your medicine, tell your doctor.  Antidepressants can make you feel tired, dizzy, or nervous. Some people have dry mouth, constipation, headaches, sexual problems, or diarrhea. Many of these side effects are mild and will go away on their own after you have been taking the medicine for a few weeks. Some may last longer. Talk to your doctor if side effects are bothering you too much. You might be able to try a different medicine.  ?? Get enough sleep. If you have problems sleeping:  ?? Go to bed at the same time every night, and get up at the same time every morning.  ?? Keep your bedroom dark and quiet.  ?? Do not exercise after 5:00 p.m.  ??  Avoid drinks with caffeine after 5:00 p.m.  ?? Avoid sleeping pills unless they are prescribed by the doctor treating your depression. Sleeping pills may make you groggy during the day, and they may interact with other medicine you are taking.  ?? If you have any other illnesses, such as diabetes, heart disease, or high blood pressure, make sure to continue with your treatment. Tell your doctor about all of the medicines you take, including those with or without a prescription.  ?? Keep the numbers for these national suicide hotlines: 1-800-273-TALK 256 279 0433) and 1-800-SUICIDE (586)170-3634). If you or someone you know talks about suicide or feeling hopeless, get help right away.  When should you call for help?  Call 911 anytime you think you may need emergency care. For example, call if:  ?? You feel like hurting yourself or someone else.  ?? Someone you know has depression and is about to attempt or is attempting suicide.  Call your doctor now or seek immediate medical care if:  ?? You hear voices.  ?? Someone you know has depression and:  ?? Starts to give away his or her possessions.  ?? Uses illegal drugs or drinks alcohol heavily.  ?? Talks or writes about death, including writing suicide notes or talking about guns, knives, or pills.  ?? Starts to spend a lot of time alone.   ?? Acts very aggressively or suddenly appears calm.  Watch closely for changes in your health, and be sure to contact your doctor if:  ?? You do not get better as expected.   Where can you learn more?   Go to [email or web address]  Enter N529 in the search box to learn more about "Self-Care While You Recover From Depression: After Your Visit."   ?? 2006-2015 [employer], [employer]. Care instructions adapted under license by [employer] (which disclaims liability or warranty for this information). This care instruction is for use with your licensed healthcare professional. If you have questions about a medical condition or this instruction, always ask your healthcare professional. [place] any warranty or liability for your use of this information.  Content Version: 10.5.422740; Current as of: November 21, 2012              Diabetes Foot Care: After Your Visit  Your Care Instructions     When you have diabetes, your feet need extra care and attention. Diabetes can damage the nerve endings and blood vessels in your feet, making you less likely to notice when your feet are injured. Diabetes also limits your body's ability to fight infection and get blood to areas that need it. If you get a minor foot injury, it could become an ulcer or a serious infection. With good foot care, you can prevent most of these problems.  Caring for your feet can be quick and easy. Most of the care can be done when you are bathing or getting ready for bed.  Follow-up care is a key part of your treatment and safety. Be sure to make and go to all appointments, and call your doctor if you are having problems. It???s also a good idea to know your test results and keep a list of the medicines you take.  How can you care for yourself at home?  ?? Keep your blood sugar close to normal by watching what and how much you eat, monitoring blood sugar, taking medicines if prescribed, and getting regular exercise.   ?? Do not smoke. Smoking affects blood flow and can  make foot problems worse. If you need help quitting, talk to your doctor about stop-smoking programs and medicines. These can increase your chances of quitting for good.  ?? Eat a diet that is low in fats. High fat intake can cause fat to build up in your blood vessels and decrease blood flow.  ?? Inspect your feet daily for blisters, cuts, cracks, or sores. If you cannot see well, use a mirror or have someone help you.  ?? Take care of your feet:  ?? Wash your feet every day. Use warm (not hot) water. Check the water temperature with your wrists or other part of your body, not your feet.  ?? Dry your feet well. Pat them dry. Do not rub the skin on your feet too hard. Dry well between your toes. If the skin on your feet stays moist, bacteria or a fungus can grow, which can lead to infection.  ?? Keep your skin soft. Use moisturizing skin cream to keep the skin on your feet soft and prevent calluses and cracks. But do not put the cream between your toes, and stop using any cream that causes a rash.  ?? Clean underneath your toenails carefully. Do not use a sharp object to clean underneath your toenails. Use the blunt end of a nail file or other rounded tool.  ?? Trim and file your toenails straight across to prevent ingrown toenails. Use a nail clipper, not scissors. Use an emery board to smooth the edges.  ?? Change socks daily. Socks without seams are best, because seams often rub the feet. You can find socks for people with diabetes from specialty catalogs.  ?? Look inside your shoes every day for things like gravel or torn linings, which could cause blisters or sores.  ?? Buy shoes that fit well:  ?? Look for shoes that have plenty of space around the toes. This helps prevent bunions and blisters.  ?? Try on shoes while wearing the kind of socks you will usually wear with the shoes.  ?? Avoid plastic shoes. They may rub your feet and cause blisters. Good  shoes should be made of materials that are flexible and breathable, such as leather or cloth.  ?? Break in new shoes slowly by wearing them for no more than an hour a day for several days. Take extra time to check your feet for red areas, blisters, or other problems after you wear new shoes.  ?? Do not go barefoot. Do not wear sandals, and do not wear shoes with very thin soles. Thin soles are easy to puncture. They also do not protect your feet from hot pavement or cold weather.  ?? Have your doctor check your feet during each visit. If you have a foot problem, see your doctor. Do not try to treat an early foot problem at home. Home remedies or treatments that you can buy without a prescription (such as corn removers) can be harmful.  ?? Always get early treatment for foot problems. A minor irritation can lead to a major problem if not properly cared for early.  When should you call for help?  Call your doctor now or seek immediate medical care if:  ?? You have a foot sore, an ulcer or break in the skin that is not healing after 4 days, bleeding corns or calluses, or an ingrown toenail.  ?? You have blue or black areas, which can mean bruising or blood flow problems.  ?? You have peeling skin or tiny  blisters between your toes or cracking or oozing of the skin.  ?? You have a fever for more than 24 hours and a foot sore.  ?? You have new numbness or tingling in your feet that does not go away after you move your feet or change positions.  ?? You have unexplained or unusual swelling of the foot or ankle.  Watch closely for changes in your health, and be sure to contact your doctor if:  ?? You cannot do proper foot care.   Where can you learn more?   Go to [email or web address]  Enter A739 in the search box to learn more about "Diabetes Foot Care: After Your Visit."   ?? 2006-2015 [employer], [employer]. Care instructions adapted under license by [employer] (which disclaims liability or warranty for this  information). This care instruction is for use with your licensed healthcare professional. If you have questions about a medical condition or this instruction, always ask your healthcare professional. [place] any warranty or liability for your use of this information.  Content Version: 10.5.422740; Current as of: November 21, 2012              DASH Diet: After Your Visit  Your Care Instructions  The DASH diet is an eating plan that can help lower your blood pressure. DASH stands for Dietary Approaches to Stop Hypertension. Hypertension is high blood pressure.  The DASH diet focuses on eating foods that are high in calcium, potassium, and magnesium. These nutrients can lower blood pressure. The foods that are highest in these nutrients are fruits, vegetables, low-fat dairy products, nuts, seeds, and legumes. But taking calcium, potassium, and magnesium supplements instead of eating foods that are high in those nutrients does not have the same effect. The DASH diet also includes whole grains, fish, and poultry.  The DASH diet is one of several lifestyle changes your doctor may recommend to lower your high blood pressure. Your doctor may also want you to decrease the amount of sodium in your diet. Lowering sodium while following the DASH diet can lower blood pressure even further than just the DASH diet alone.  Follow-up care is a key part of your treatment and safety. Be sure to make and go to all appointments, and call your doctor if you are having problems. It's also a good idea to know your test results and keep a list of the medicines you take.  How can you care for yourself at home?  Following the DASH diet  ?? Eat 4 to 5 servings of fruit each day. A serving is 1 medium-sized piece of fruit, ?? cup chopped or canned fruit, 1/4 cup dried fruit, or 4 ounces (?? cup) of fruit juice. Choose fruit more often than fruit juice.  ?? Eat 4 to 5 servings of vegetables each day. A serving is 1 cup of  lettuce or raw leafy vegetables, ?? cup of chopped or cooked vegetables, or 4 ounces (?? cup) of vegetable juice. Choose vegetables more often than vegetable juice.  ?? Get 2 to 3 servings of low-fat and fat-free dairy each day. A serving is 8 ounces of milk, 1 cup of yogurt, or 1 ?? ounces of cheese.  ?? Eat 6 to 8 servings of grains each day. A serving is 1 slice of bread, 1 ounce of dry cereal, or ?? cup of cooked rice, pasta, or cooked cereal. Try to choose whole-grain products as much as possible.  ?? Limit lean meat, poultry, and fish to  2 servings each day. A serving is 3 ounces, about the size of a deck of cards.  ?? Eat 4 to 5 servings of nuts, seeds, and legumes (cooked dried beans, lentils, and split peas) each week. A serving is 1/3 cup of nuts, 2 tablespoons of seeds, or ?? cup of cooked beans or peas.  ?? Limit fats and oils to 2 to 3 servings each day. A serving is 1 teaspoon of vegetable oil or 2 tablespoons of salad dressing.  ?? Limit sweets and added sugars to 5 servings or less a week. A serving is 1 tablespoon jelly or jam, ?? cup sorbet, or 1 cup of lemonade.  ?? Eat less than 2,300 milligrams (mg) of sodium a day. If you have high blood pressure, diabetes, or chronic kidney disease, if you are African-American, or if you are older than age 42, try to limit the amount of sodium you eat to less than 1,500 mg a day.  Tips for success  ?? Start small. Do not try to make dramatic changes to your diet all at once. You might feel that you are missing out on your favorite foods and then be more likely to not follow the plan. Make small changes, and stick with them. Once those changes become habit, add a few more changes.  ?? Try some of the following:  ?? Make it a goal to eat a fruit or vegetable at every meal and at snacks. This will make it easy to get the recommended amount of fruits and vegetables each day.  ?? Try yogurt topped with fruit and nuts for a snack or healthy dessert.   ?? Add lettuce, tomato, cucumber, and onion to sandwiches.  ?? Combine a ready-made pizza crust with low-fat mozzarella cheese and lots of vegetable toppings. Try using tomatoes, squash, spinach, broccoli, carrots, cauliflower, and onions.  ?? Have a variety of cut-up vegetables with a low-fat dip as an appetizer instead of chips and dip.  ?? Sprinkle sunflower seeds or chopped almonds over salads. Or try adding chopped walnuts or almonds to cooked vegetables.  ?? Try some vegetarian meals using beans and peas. Add garbanzo or kidney beans to salads. Make burritos and tacos with mashed pinto beans or black beans.   Where can you learn more?   Go to GreenNylon.com.cy  Enter H967 in the search box to learn more about "DASH Diet: After Your Visit."   ?? 2006-2015 Healthwise, Incorporated. Care instructions adapted under license by [employer] (which disclaims liability or warranty for this information). This care instruction is for use with your licensed healthcare professional. If you have questions about a medical condition or this instruction, always ask your healthcare professional. [place] any warranty or liability for your use of this information.  Content Version: 10.5.422740; Current as of: February 27, 2013        WEIGHT LOSS PLAN    1.  Read food labels and count calories.  Goal 1000-1200 calories daily for women and 1200-1600 calories daily for men.  Achieve this by decreasing caloric intake by 500 calories by weekly increments.  NOTE:  1 pound = 3500 calories!  Www.loseit.com  BikingRewards.pl  Www.sparkpeople.com  Www.healthfinder.gov  Weight watchers mobile    Calories needed to lose 1-2 pounds a week = 10 x your weight in pounds    2.  Increase water intake.    Per [facility], it is important to drink 1/2 your body weight in ounces of  water daily.  Decrease your water consumption 2-3 hours before bedtime to prevent sleep  disturbance from frequent urination.    3.  Decrease sugary beverages.  Each can or glass of soda increases your risk of obesity by 60%.  Can lose 10 pounds in a month by avoiding any soda.     12 oz can of soda = 140 calories, 16 oz cup of sweet tea = 200 calories    16 oz orange juice = 200 calories, 10 oz apple juice = 150 calories   32 oz sports drink = 200 calories, 16 oz punch = 240 calories   3.5 oz alcohol = 100-150 calories     4.  Avoid High Fructose Corn Syrup products.  This ingredient makes products highly addictive!    5.  Exercise 30 minutes daily 5 days weekly, minimally.  If you burn 3500 calories that equals a pound!  Use a pedometer to count steps. Visit www.JustKeepMoving.com for a free pedometer and diet recommendations.      Your maximum heart rate = 220 - your age    Never exercise at your maximum heart rate.    See handout for target heart rate.    6.  Decrease carbohydrates (white bread, pasta, rice, potatoes, sweet foods and sweet drinks like soda, tea, coffee, juice and sports drinks).  Increase fiber and protein.    Goal:  50-150 calories daily of carbohydrates     Try CHROMIUM PICONATE 200 MG THREE TIMES DAILY,    to decrease Premenstrual carbohydrate cravings.    7.  Eat 3-5 small meals daily, include lots of protein (beans/legumes, nuts, lean meat, eggs) and green vegetables with each.  (Breakfast, lunch, dinner with 2 healthy snacks)    8.  Get proper rest 7-8 hours uninterrupted.  When you get less than 6 hours, it triggers hunger by affecting your [place]:Leptin ratio and this results in weight gain.    9.  Watch your food portions.  Green leafy vegetables should cover 1/2 of the plate, lean meat 1/4 of the plate, starchy vegetable 1/4 of the plate.  Use smaller plates.    10.  Do not eat until you are full.  Eat until you are no longer hungry.  If you are not sure, try drinking a glass of water before getting your second serving of food.     11.  Do not weigh yourself daily.  Wait until your next office visit.  Use how you feel and  how your clothes fit as measurements of success.    12.  Address your spirituality to draw strength from above during your journey.  Remember "I am fearfully and wonderfully made.  Marvelous in His eyes."    13.  Set realistic, appropriate and achievable weight loss goals:     RECOMMENDED TARGET WEIGHT LOSS:  Initial weight loss of 5-10% of your initial body weight achieved over 6 months or a decrease of 2 BMI units.     MINIMUM GOAL OF WEIGHT LOSS:  Reduce body weight and maintain a lower body weight.  Prevent weight gain.          RELATED WEBSITES:      Www.obesityaction.org  (and consider joining the Obesity Action Coalition for $25/year.  The [facility] mission is to elevated and empower those affected by obesity through education, advocacy and support.  Quarterly journals included in membership fee.)    Www.thebiggestloser.com  PatentHood.ch     RELATED DIETS:  Dr. Allene Dillon  [place] Weight loss challenge, Mediterranean diet, [place] Diet, [employer] Watchers, Paleo Diet (anti-inflammatory diet)        EXERCISE 150 MINUTES WEEKLY.  THIS CAN BE ACHIEVED BY WORKING OUT OR WALKING A MINIMUM OF 30 MINUTES FOR 5 DAYS WEEKLY.  YOU CAN EXERCISE IN INCREMENTS OF 10-15 MINUTES UP TO 3 TIMES A DAY.    Consider performing "brainless exercise."  Choose your favorite tv program.  Five minutes before and for 5 minutes after the tv program, stretch your body.  While the program is on, walk in place watching the show.  When commercials come on, rest or walk around the house to do other things.  When the program begins, return to walking in place.  When you are able keep walking during the commercials and add light weights to your ankles or hands.  By the end of the show, you would have walked 30 minutes.  If you need shorter spurts of exercise, walk during the commercials and rest during the show.     Drink to glasses of water prior to any exercise to prevent dehydration and to improve the results of the work out.      Your RESTING HEART RATE is the number of times your heart beats per minute when you are not exerting yourself.  The more fit you are, the lower your resting heart rate will be.    Your MAXIMUM HEART RATE is the number of times per minute your heart pumps when it is working at 100% capacity.  NEVER EXERCISE AT YOUR MAXIMUM HEART RATE.    MAX HEART RATE = 220 - your age    For example, in a 47 year old, the maximum heart rate is 220-50 = 170 beats per minute    Your [place] is the number of beats per minute our heart should pump during aerobic exercise.  Reaching your target heart rate indicates that your body is receiving maximum cardiovascular and fat burning benefits.     If you are fit, your TARGET HEART RATE = 70-85% of your maximum Heart rate      For a 48 year old with vigorous intensity physical activity,         Target heart rate= 170 bpm x .70 = 119 bpm     Target heart rate = 170 bpm x .85=  145 bpm        Therefore, your target heart rate during physical activity is 119-145      If you are not fit, TARGET HEART RATE = 50-70% of your maximum Heart rate    MODERATE CONSISTENT AEROBIC EXERCISE OR WALKING FOR AT LEAST 150 MINUTES WEEKLY IS AN ESSENTIAL PART OF ANY WEIGHT LOSS OF WEIGHT MAINTENANCE PROGRAM.     During WEIGHT LOSS PHASE, at least 75% of your exercise needs to be walking or moderate aerobic activity and 25% or 0% can be Isometric or Resistance type exercises (the latter can be deferred to the weight maintenance phase.)     During WEIGHT MAINTENANCE PHASE, at least 50% of your exercise needs to be aerobic and 50% Isometric or Resistance type exercises.     BENEFITS OF MODERATE INTENSITY EXERCISE MOST DAYS OF THE WEEK:     1.  Insulin Resistance improves 30-85%   2.  Abdominal Fat decreases by 30%   3.  Inflammatory Markers decrease by 30% (therefore pain decreases)    4.  Systolic and Diastolic Blood Pressure decrease by 4 mmHg  5.  HDL improves by 5%   6.  Triglycerides decrease by 15%   7.  Shift from small dense LDL to large dense LDL (therefore decrease Insulin Resistance)   8.  Decrease coagulability                   DASH Diet: After Your Visit  Your Care Instructions  The DASH diet is an eating plan that can help lower your blood pressure. DASH stands for Dietary Approaches to Stop Hypertension. Hypertension is high blood pressure.  The DASH diet focuses on eating foods that are high in calcium, potassium, and magnesium. These nutrients can lower blood pressure. The foods that are highest in these nutrients are fruits, vegetables, low-fat dairy products, nuts, seeds, and legumes. But taking calcium, potassium, and magnesium supplements instead of eating foods that are high in those nutrients does not have the same effect. The DASH diet also includes whole grains, fish, and poultry.  The DASH diet is one of several lifestyle changes your doctor may recommend to lower your high blood pressure. Your doctor may also want you to decrease the amount of sodium in your diet. Lowering sodium while following the DASH diet can lower blood pressure even further than just the DASH diet alone.  Follow-up care is a key part of your treatment and safety. Be sure to make and go to all appointments, and call your doctor if you are having problems. It's also a good idea to know your test results and keep a list of the medicines you take.  How can you care for yourself at home?  Following the DASH diet  ?? Eat 4 to 5 servings of fruit each day. A serving is 1 medium-sized piece of fruit, ?? cup chopped or canned fruit, 1/4 cup dried fruit, or 4 ounces (?? cup) of fruit juice. Choose fruit more often than fruit juice.  ?? Eat 4 to 5 servings of vegetables each day. A serving is 1 cup of lettuce or raw leafy vegetables, ?? cup of chopped or cooked vegetables, or  4 ounces (?? cup) of vegetable juice. Choose vegetables more often than vegetable juice.  ?? Get 2 to 3 servings of low-fat and fat-free dairy each day. A serving is 8 ounces of milk, 1 cup of yogurt, or 1 ?? ounces of cheese.  ?? Eat 6 to 8 servings of grains each day. A serving is 1 slice of bread, 1 ounce of dry cereal, or ?? cup of cooked rice, pasta, or cooked cereal. Try to choose whole-grain products as much as possible.  ?? Limit lean meat, poultry, and fish to 2 servings each day. A serving is 3 ounces, about the size of a deck of cards.  ?? Eat 4 to 5 servings of nuts, seeds, and legumes (cooked dried beans, lentils, and split peas) each week. A serving is 1/3 cup of nuts, 2 tablespoons of seeds, or ?? cup of cooked beans or peas.  ?? Limit fats and oils to 2 to 3 servings each day. A serving is 1 teaspoon of vegetable oil or 2 tablespoons of salad dressing.  ?? Limit sweets and added sugars to 5 servings or less a week. A serving is 1 tablespoon jelly or jam, ?? cup sorbet, or 1 cup of lemonade.  ?? Eat less than 2,300 milligrams (mg) of sodium a day. If you have high blood pressure, diabetes, or chronic kidney disease, if you are African-American, or if you are older than  age 94, try to limit the amount of sodium you eat to less than 1,500 mg a day.  Tips for success  ?? Start small. Do not try to make dramatic changes to your diet all at once. You might feel that you are missing out on your favorite foods and then be more likely to not follow the plan. Make small changes, and stick with them. Once those changes become habit, add a few more changes.  ?? Try some of the following:  ?? Make it a goal to eat a fruit or vegetable at every meal and at snacks. This will make it easy to get the recommended amount of fruits and vegetables each day.  ?? Try yogurt topped with fruit and nuts for a snack or healthy dessert.  ?? Add lettuce, tomato, cucumber, and onion to sandwiches.   ?? Combine a ready-made pizza crust with low-fat mozzarella cheese and lots of vegetable toppings. Try using tomatoes, squash, spinach, broccoli, carrots, cauliflower, and onions.  ?? Have a variety of cut-up vegetables with a low-fat dip as an appetizer instead of chips and dip.  ?? Sprinkle sunflower seeds or chopped almonds over salads. Or try adding chopped walnuts or almonds to cooked vegetables.  ?? Try some vegetarian meals using beans and peas. Add garbanzo or kidney beans to salads. Make burritos and tacos with mashed pinto beans or black beans.   Where can you learn more?   Go to GreenNylon.com.cy  Enter H967 in the search box to learn more about "DASH Diet: After Your Visit."   ?? 2006-2015 Healthwise, Incorporated. Care instructions adapted under license by [employer] (which disclaims liability or warranty for this information). This care instruction is for use with your licensed healthcare professional. If you have questions about a medical condition or this instruction, always ask your healthcare professional. [place] any warranty or liability for your use of this information.  Content Version: 10.5.422740; Current as of: February 27, 2013              Insomnia: After Your Visit  Your Care Instructions  Insomnia is the inability to sleep well. It is a common problem for most people at some time. Insomnia may make it hard for you to get to sleep, stay asleep, or sleep as long as you need to. This can make you tired and grouchy during the day. It can also make you forgetful, less effective at work, and unhappy.  Insomnia can be caused by conditions such as depression or anxiety. Pain can also affect your ability to sleep. When these problems are solved, the insomnia usually clears up. But sometimes bad sleep habits can cause insomnia.  If insomnia is affecting your work or your enjoyment of life, you can take steps to improve your sleep.   Follow-up care is a key part of your treatment and safety. Be sure to make and go to all appointments, and call your doctor if you are having problems. It???s also a good idea to know your test results and keep a list of the medicines you take.  How can you care for yourself at home?  What to avoid   ?? Do not have drinks with caffeine, such as coffee or black tea, for 8 hours before bed.  ?? Do not smoke or use other types of tobacco near bedtime. Nicotine is a stimulant and can keep you awake.  ?? Avoid drinking alcohol late in the evening, because it can cause you to  wake in the middle of the night.  ?? Do not eat a big meal close to bedtime. If you are hungry, eat a light snack.  ?? Do not drink a lot of water close to bedtime, because the need to urinate may wake you up during the night.  ?? Do not read or watch TV in bed. Use the bed only for sleeping and sexual activity.  What to try   ?? Go to bed at the same time every night, and wake up at the same time every morning. Do not take naps during the day.  ?? Keep your bedroom quiet, dark, and cool.  ?? Get regular exercise, but not within 3 to 4 hours of your bedtime.  ?? Sleep on a comfortable pillow and mattress.  ?? If watching the clock makes you anxious, turn it facing away from you so you cannot see the time.  ?? If you worry when you lie down, start a worry book. Well before bedtime, write down your worries, and then set the book and your concerns aside.  ?? Try meditation or other relaxation techniques before you go to bed.  ?? If you cannot fall asleep, get up and go to another room until you feel sleepy. Do something relaxing. Repeat your bedtime routine before you go to bed again.  ?? Make your house quiet and calm about an hour before bedtime. Turn down the lights, turn off the TV, log off the computer, and turn down the volume on music. This can help you relax after a busy day.  When should you call for help?   Watch closely for changes in your health, and be sure to contact your doctor if:  ?? Your efforts to improve your sleep do not work.  ?? Your insomnia gets worse.  ?? You have been feeling down, depressed, or hopeless or have lost interest in things that you usually enjoy.   Where can you learn more?   Go to GreenNylon.com.cy  Enter P513 in the search box to learn more about "Insomnia: After Your Visit."   ?? 2006-2015 Healthwise, Incorporated. Care instructions adapted under license by [employer] (which disclaims liability or warranty for this information). This care instruction is for use with your licensed healthcare professional. If you have questions about a medical condition or this instruction, always ask your healthcare professional. [place] any warranty or liability for your use of this information.  Content Version: 10.5.422740; Current as of: November 21, 2012       SLEEP HYGIENE    1.  Try to maintain a regular schedule for bedtime, rise time, meals, exercise, chores, etc.  The body's internal clock or Circadian Rhythm works best when set schedules are kept.  If you stay up later on weekends, try not to sleep more than an hour past your usual wake time.  ALLOW AT LEAST 7 HOURS UNINTERRUPTED SLEEP.    2.  Try an OTC sleep agent (Unisom, Benadryl, Melatonin 0.5-1 mg, etc.) nightly for 2 weeks, if okay with your doctor.  Take 1 pill 30 minutes before your chosen bedtime.  This will help to reset your sleep/wake cycle.  Absolutely no driving once this medicine is taken.      3.  AVOID stimulants such as caffeine (coffee, tea, cocoa, chocolate, colas) and medications like No Doz or ADHD medication after lunchtime or 4-8 hours before bedtime.  Heavy caffeine use early in the day can also disrupt sleep.  4.  AVOID alcoholic beverages 6 hours prior to bedtime.  It can cause frequent awakenings in the night as your body gets rid of the alcohol.     5.  AVOID heavy meals before bedtime.  Try to eat dinner 2-3 hours before bedtime.  A light snack which includes dairy products is a good idea since these chemicals (tryptophan) promote drowsiness.  Do not do this if you are intolerant to dairy products.    6.  AVOID exercising before bedtime.  Exercise should be performed preferably in light during the day.    7.  AVOID napping during the day, especially after 3 pm.  If a nap is needed, limit daytime naps to 1 hour.    8.  AVOID nicotine prior to bed.  Withdrawal from nicotine can cause temporary sleep disruptions.  Once withdrawal is complete, the ex-smoker will probably find that sleep comes faster.    9.  AVOID laying in bed awake for long periods of time.  Ger up, do a simple activity, then return to the bedroom when you are drowsy.    10.  AVOID clock watching.  Remove easily visible clocks from the room or place them where they are not easily visible.  Clock watching adds stress to this situation.    11.  AVOID bright lights and loud music in the bedroom.  Make the room dark and quiet.  Sleep is the only thing you should do in your bed.  Light signals your brain to be alert.    12.  AVOID warm rooms when trying to sleep.  Keep the bedroom well-ventilated and on the cool side.  Feeling too warm can cause night time waking.    13. AVOID drinking large amounts of water at bedtime.  Decrease beverage consumption  2-3 hours before bedtime.   Use the bathroom to empty your bladder before getting into bed.    14.  Take an over the counter sleep agent such as Tylenol PM or Advil PM if pain is keeping you awake at night, if okay with your doctor.          Insomnia: After Your Visit  Your Care Instructions  Insomnia is the inability to sleep well. It is a common problem for most people at some time. Insomnia may make it hard for you to get to sleep, stay asleep, or sleep as long as you need to. This can make you tired and  grouchy during the day. It can also make you forgetful, less effective at work, and unhappy.  Insomnia can be caused by conditions such as depression or anxiety. Pain can also affect your ability to sleep. When these problems are solved, the insomnia usually clears up. But sometimes bad sleep habits can cause insomnia.  If insomnia is affecting your work or your enjoyment of life, you can take steps to improve your sleep.  Follow-up care is a key part of your treatment and safety. Be sure to make and go to all appointments, and call your doctor if you are having problems. It???s also a good idea to know your test results and keep a list of the medicines you take.  How can you care for yourself at home?  What to avoid   ?? Do not have drinks with caffeine, such as coffee or black tea, for 8 hours before bed.  ?? Do not smoke or use other types of tobacco near bedtime. Nicotine is a stimulant and can keep you awake.  ??  Avoid drinking alcohol late in the evening, because it can cause you to wake in the middle of the night.  ?? Do not eat a big meal close to bedtime. If you are hungry, eat a light snack.  ?? Do not drink a lot of water close to bedtime, because the need to urinate may wake you up during the night.  ?? Do not read or watch TV in bed. Use the bed only for sleeping and sexual activity.  What to try   ?? Go to bed at the same time every night, and wake up at the same time every morning. Do not take naps during the day.  ?? Keep your bedroom quiet, dark, and cool.  ?? Get regular exercise, but not within 3 to 4 hours of your bedtime.  ?? Sleep on a comfortable pillow and mattress.  ?? If watching the clock makes you anxious, turn it facing away from you so you cannot see the time.  ?? If you worry when you lie down, start a worry book. Well before bedtime, write down your worries, and then set the book and your concerns aside.  ?? Try meditation or other relaxation techniques before you go to bed.   ?? If you cannot fall asleep, get up and go to another room until you feel sleepy. Do something relaxing. Repeat your bedtime routine before you go to bed again.  ?? Make your house quiet and calm about an hour before bedtime. Turn down the lights, turn off the TV, log off the computer, and turn down the volume on music. This can help you relax after a busy day.  When should you call for help?  Watch closely for changes in your health, and be sure to contact your doctor if:  ?? Your efforts to improve your sleep do not work.  ?? Your insomnia gets worse.  ?? You have been feeling down, depressed, or hopeless or have lost interest in things that you usually enjoy.   Where can you learn more?   Go to [email or web address]  Enter P513 in the search box to learn more about "Insomnia: After Your Visit."   ?? 2006-2015 [employer]. Care instructions adapted under license by [employer] (which disclaims liability or warranty for this information). This care instruction is for use with your licensed healthcare professional. If you have questions about a medical condition or this instruction, always ask your healthcare professional. [place] any warranty or liability for your use of this information.  Content Version: 10.5.422740; Current as of: November 21, 2012

## 2013-11-09 NOTE — Progress Notes (Signed)
HISTORY OF PRESENT ILLNESS  Katrina [name] is a 48 y.o. female presents with Establish Care; Medication Refill; and Labs    Agree with nurse note.    Pt presents to establish care with me.  She was previously followed by Dr. Brain Hilts before she left the practice.      Hypertensive, diet controlled diabetic pt with hypothyroidism, dyslipidemia, and Vitamin D deficiency  presents to the office with a BP of 132/86.  Her BP outside of the office averages 125/82.  Her weight is stable at 191 lbs.  Her lowest weight was 178.6 on 12/10/12.  Dr. Evonnie Pat decreased her Synthroid from 88 mcg daily to 75 mcg daily on 04/14/13 when her TSH was 0.557, but she felt that she was constantly hungry.  She increased her Synthroid back to 88 mcg daily x1 month, and her hunger has subsided.  Her TSH on 07/31/13 was 1.210 and her free T4 was 1.34.  She is still somewhat fatigued.  She has stopped cholesterol, Metformin and BP medications in 2013 and does not want to have to go back on the medications.  She was dx'd with DM in 2005 was was rx'd Metformin.  She is a runner, but is having difficulty finding time to exercise.  She is working full time and going to school part time.      Pt with depression, anxiety, stress, and adjustment disorder.  She has dealt with depression at several periods in her life, but the anxiety started this year.  She often has a racing mind.  She is taking Zoloft 50 mg daily and Ativan 0.5 mg prn, tolerating well.  She has taken Prozac, Wellbutrin, Lexapro 20 mg in 12/2007, and Paxil in the past.  She did not tolerate the Paxil, because it worsened her sxs.  When she was taking Lexapro, she was waking up every morning crying.      Pt with insomnia complains of fatigue.  She wakes up tired.  She wears a FitBit at night, which tells her that she sleeps 3-4 hours nightly, even though she feels that she is asleep for 7-8 hours.  She has had several  sleep studies, with Dr. Dineen Kid, who she last saw on 03/16/11.  He noted that she had mild sleep apnea, dx'd on11/27/15, but she recalls him telling her that she did not have sleep apnea.  F/U 1 year.  She has a CPAP machine set at 8 cm, but does not use it currently.        Pt with fatty liver.  She is followed by GI hepatologist,  Dr. Jeanann Lewandowsky.  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  She is currently in a NASH study at [facility] with Dr. Patience Musca.  Dr. Patience Musca ordered labs from [facility] on 06/04/13.  Her LDL was 86.  Insulin was 9, down from 19.  Her Hgb A1C was 5.6, increased from 5.3  Her VItmain D was 37, increased from 32.  Her TSH was 0.81.      She is s/p LAP adrenalectomy, due to benign tumor with  Dr. Boston Service, on 08/01/09.  Her BP at that time was not controlled.  She saw nephrologist, Dr. Lorretta Harp on 12/20/09.       Pt has had mammograms annually since she was 31 due to her mother's breast cancer dx at the age of 37.  In the last 4 years she has been also having breast MRIs  q 6 months after her mammogram.  She is followed by GYN, Dr. Gerre Pebbles.  She is s/p BL breast reduction in April 18, 1987 with Dr. Jetty Duhamel.  Her most recent pap smear was in 07/2013.      Pt went to cardiologist, Dr. Sinclair Ship on 12/12/09 for chest pain and swelling.  Her stress echocardiogram at that time was normal.      Written by Coralie Carpen, scribe, as dictated by Dr. Felix Ahmadi, DO.        ROS    Review of Systems negative except as noted above in HPI.    ALLERGIES:    Allergies   Allergen Reactions   ??? Aldactone [Spironolactone] Swelling   ??? Codeine Rash and Itching   ??? Paxil [Paroxetine Hcl] Other (comments)     WORSENED DEPRESSION.       CURRENT MEDICATIONS:    Outpatient Prescriptions Marked as Taking for the 11/09/13 encounter (Office Visit) with Talbert Cage, DO   Medication Sig Dispense Refill    ??? levothyroxine (SYNTHROID) 88 mcg tablet Take  by mouth Daily (before breakfast).     ??? cholecalciferol, vitamin D3, (VITAMIN D3) 2,000 unit tab Take  by mouth.     ??? sertraline (ZOLOFT) 50 mg tablet Take 1.5 Tabs by mouth daily. Indications: ANXIETY WITH DEPRESSION 135 Tab 3   ??? LORazepam (ATIVAN) 0.5 mg tablet Take 1 Tab by mouth every eight (8) hours as needed for Anxiety. Max Daily Amount: 1.5 mg. Indications: ANXIETY 90 Tab 0   ??? metFORMIN ER (GLUCOPHAGE XR) 500 mg tablet Take 2 Tabs by mouth Before breakfast and dinner. Indications: TYPE 2 DIABETES MELLITUS 360 Tab 3   ??? nystatin-triamcinolone (MYCOLOG) 100,000-0.1 unit/gram-% ointment as needed.     ??? cyclobenzaprine (FLEXERIL) 10 mg tablet 10 mg as needed.     ??? b complex vitamins tablet Take 1 Tab by mouth daily.       ??? ascorbic acid (VITAMIN C) 500 mg tablet Take  by mouth.     ??? vitamin E (AQUA GEMS) 400 unit capsule Take 400 Units by mouth two (2) times a day.         PAST MEDICAL HISTORY:    Past Medical History   Diagnosis Date   ??? Obesity 07/25/2007     Improved with Lifestyle Modifications   ??? Dyslipidemia 18-Apr-2007     Improving with weight loss   ??? Fatty liver 07/25/2007     with elevated AST, ALT.  In NASH study at [facility] (Dr. Hilary Hertz)   ??? 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  fiance's death 04-18-95).     ??? Heartburn 07/25/2007     Has improved with weight loss   ??? Essential hypertension 18-Apr-1995     Dr. Mcneil Sober, nephro.   ??? 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 Family history of breast cancer in mother 53     MRI, 16 mo.  Dr. Gerre Pebbles   ??? Adrenal adenoma 06/2009     Dr. Mcneil Sober, nephro.  Dr. Boston Service, surgeon.   ??? Chest pain of uncertain etiology 70/03/5007     Dr. Sinclair Ship.  Normal Stress Echo 12/26/09.   ??? Type 2 diabetes, diet controlled ([place]) 1999   ??? Osteoarthritis of lumbar spine      MRI 12/14   ??? Osteoarthritis of thoracic spine      MRI 12/14, Stable    ???  Vitamin D deficiency 06/15/2013   ??? Abnormal gall bladder diagnostic imaging 07/2000     Abnormal HIDA.  Dr. Tomasita Morrow.   ??? 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  Dr. Gerilyn Nestle.   ??? Chronic insomnia 2009, 2011     Dr. Dineen Kid.   ??? OSA (obstructive sleep apnea) 11/2010     Mild.  Txd CPAP 8 cm H20.  Dr. Bretta Bang.  stopped 11/2011.       PAST SURGICAL HISTORY:    Past Surgical History   Procedure Laterality Date   ??? Pr appendectomy  07/2000     Dr. Tomasita Morrow.   ??? Hx tonsillectomy  1974   ??? Hx breast reduction  1989     Bilaterally.  Dr. Ashby Dawes.    ??? Hx cholecystectomy  07/2000     Gallbladder malfunctioning.  Dr. Tomasita Morrow.   ??? Pr abdomen surgery proc unlisted Right 08/01/09     LAP ADRENALECTOMY.  due to benign tumor.  Dr. Boston Service.   ??? Hx other surgical  2008, 2013     LIVER BIOPSY X 2.         FAMILY HISTORY:    Family History   Problem Relation Age of Onset   ??? Hypertension Mother    ??? Breast Cancer Mother 61   ??? Stroke Mother 63   ??? Diabetes Father    ??? Hypertension Father    ??? Stroke Sister 21     Carotid Artery Dissection   ??? Anxiety Mother    ??? Depression Mother    ??? Other Sister      psedotumor cerebri   ??? Kidney Disease Father      reversed   ??? High Cholesterol Father    ??? Diabetes Paternal Grandmother    ??? Hypertension Paternal Grandmother    ??? Hypertension Paternal Grandfather    ??? High Cholesterol Paternal Grandfather    ??? Diabetes Maternal Grandmother    ??? Hypertension Maternal Grandmother    ??? Diabetes Maternal Grandfather    ??? Hypertension Maternal Grandfather    ??? Cancer Paternal Grandmother      ?LEUKEMIA   ??? Cancer Paternal Uncle      PANCREATIC   ??? Cancer Maternal Uncle      PANCREATIC       SOCIAL HISTORY:    History     Social History   ??? Marital Status: SINGLE     Spouse Name: Single     Number of Children: 0   ??? Years of Education: N/A     Occupational History   ??? [employer]     Social History Main Topics    ??? Smoking status: Former Smoker -- 0.30 packs/day for 10 years     Types: Cigarettes     Quit date: 02/08/2009   ??? Smokeless tobacco: Never Used   ??? Alcohol Use: 0.5 oz/week     1 Glasses of wine per week   ??? Drug Use: No   ??? Sexual Activity: Yes     Other Topics Concern   ??? Not on file     Social History Narrative       IMMUNIZATIONS:    Immunization History   Administered Date(s) Administered   ??? Influenza Vaccine Split 10/24/2010   ??? Pneumococcal Polysaccharide (PPV-23) 12/22/2010   ??? Pneumococcal Vaccine 12/05/2005   ??? TD Vaccine 10/23/2000   ??? TDAP Vaccine 10/24/2010  PHYSICAL EXAMINATION    Vital Signs  BP 132/86 mmHg   Pulse 75   Temp(Src) 98.7 ??F (37.1 ??C) (Oral)   Resp 16   Ht 5\' 5"  (1.651 m)   Wt 191 lb (86.637 kg)   BMI 31.78 kg/m2   SpO2 97%    Weight Metrics 11/09/2013 08/04/2013 07/31/2013 07/27/2013 07/09/2013 06/15/2013 05/25/2013   Weight 191 lb 190 lb 190 lb 12.8 oz 188 lb 12.8 oz 185 lb 6.4 oz 186 lb 8 oz 189 lb 11.2 oz   BMI 31.78 kg/m2 31.62 kg/m2 31.75 kg/m2 31.42 kg/m2 30.85 kg/m2 31.04 kg/m2 31.57 kg/m2       General appearance - Well nourished. Well appearing.  Well developed.  No acute distress. Overweight.   Head - Normocephalic.  Atraumatic.    Eyes - pupils equal and reactive. Extraocular eye movements intact. Sclera anicteric.  Mildly injected sclera.  Ears - Hearing is grossly normal bilaterally.      Nose - normal and patent.  No polyps noted.  No erythema.  No discharge.    Mouth - mucous membranes with adequate moisture.  Posterior pharynx normal with cobblestone appearance.  No erythema, white exudate or obstruction.  Neck - supple.  Midline trachea.  No carotid bruits noted bilaterally.  No thyromegaly noted.  Chest - clear to auscultation bilaterally anterriorly and posteriorly.  No wheezes.  No rales or rhonchi.  Breath sounds are symmetrical bilaterally.  Unlabored respirations.  Heart - normal rate.  Regular rhythm.  Normal S1, S2.  No murmur noted.   No rubs, clicks or gallops noted.  Abdomen - soft and nondistended.  No masses or organomegaly.  No rebound, rigidity or guarding.  Bowel sounds normal x 4 quadrants.  No tenderness noted.  Neurological - awake, alert and oriented to person, place, and time and event.  Cranial nerves II through XII intact.  Clear speech.  Muscle strength is +5/5 x 4 extremities.  Sensation is intact to light touch bilaterally.  Steady gait.   Heme/Lymph - peripheral pulses normal x 4 extremities.  No peripheral edema is noted.    Musculoskeletal - Intact x 4 extremities.  Full ROM x 4 extremities.  No pain with movement.    Back exam - normal range of motion.  No pain on palpation of the spinous processes in the cervical, thoracic, lumbar, sacral regions.  No CVA tenderness.  Increased muscle tension through out    Skin - no rashes, erythema, ecchymosis, lacerations, abrasions, suspicious moles noted  Psychological -   normal behavior, dress and thought processes.  Good insight. Good eye contact.  Normal affect.  Appropriate mood.  Normal speech.  Not change until labs recived from hepatolog  DATA REVIEWED    See scanned document    Results for orders placed or performed in visit on 07/31/13   TSH, 3RD GENERATION   Result Value Ref Range    TSH 1.210 0.450 - 4.500 uIU/mL   T4, FREE   Result Value Ref Range    T4, Free 1.34 0.82 - 1.77 ng/dL   VITAMIN B12 & FOLATE   Result Value Ref Range    Vitamin B12 452 211 - 946 pg/mL    Folate 10.7 >3.0 ng/mL   METHYLMALONIC ACID   Result Value Ref Range    METHYLMALONIC ACID, SERUM 168 0 - 378 nmol/L     Lab Results   Component Value Date/Time    VITAMIN D, 25-HYDROXY 27.2 05/25/2013 10:21 AM  Lab Results   Component Value Date/Time    HEMOGLOBIN A1C 6.1 06/27/2011 09:52 AM    HEMOGLOBIN A1C 6.3 05/30/2010 10:33 AM    HEMOGLOBIN A1C (POC) 5.1 12/25/2011 10:19 AM       ASSESSMENT and PLAN      ICD-10-CM ICD-9-CM    1. Essential hypertension I10 401.9     2. Type 2 diabetes, diet controlled (HCC) E11.9 250.00    3. Fatigue R53.83 780.79     due to insomnia vs OSA vs thyroid vs stress vs other   4. Chronic insomnia G47.00 780.52 REFERRAL TO SLEEP STUDIES   5. Major depressive disorder, recurrent, in remission (HCC) F33.40 296.35 sertraline (ZOLOFT) 50 mg tablet   6. Dyslipidemia E78.5 272.4    7. Family history of breast cancer in mother Z80.3 V16.3    90. Vitamin D deficiency E55.9 268.9     resolved   9. OSA (obstructive sleep apnea) G47.33 327.23 REFERRAL TO SLEEP STUDIES   10. Fatty liver K76.0 571.8    11. Hypothyroidism due to acquired atrophy of thyroid E03.8 244.8     E03.4 246.8     stable   12. GAD (generalized anxiety disorder) F41.1 300.02 sertraline (ZOLOFT) 50 mg tablet    improved on Zoloft   13. Stress Z65.8 V62.89 LORazepam (ATIVAN) 0.5 mg tablet   14. Adjustment disorder with mixed anxiety and depressed mood F43.23 309.28 LORazepam (ATIVAN) 0.5 mg tablet       Discussed the patient's BMI with her.  The BMI follow up plan is as follows: BMI is out of normal parameters and plan is as follows: I have counseled this patient on diet and exercise regimens.  Addressed weight, diet and exercise with patient.  Decrease carbohydrates (white foods, sweet foods, sweet drinks and alcohol), increase green leafy vegetables and protein (lean meats and beans) with each meal.  Avoid fried foods. Eat 3-5 small meals daily.  Do not skip meals.  Increase water intake.    Increase physical activity to 30 minutes daily for health benefit or 60 minutes daily to prevent weight regain, as tolerated.  Get 7-8 hours uninterrupted sleep nightly.  Chart reviewed and updated.      Controlled Substance Agreement reviewed and signed  Continue current medications and care.  Restart Metformin 500 mg nightly and titrate up to 1000 mg BID if tolerating well.    Prescriptions written and sent to pharmacy; medication side effects discussed.  Zoloft 50 mg.     Prescription given to patient during office visit today.  Ativan 0.5 mg.    Most recent tests reviewed from 07/31/13 and 05/2013  Recent office visit notes from Dr. Bretta Bang, Dr. Cristela Blue, Dr. Owens Shark, Dr. Lorretta Harp, and Dr. Suzan Slick reviewed  Referrals given; patient urged to keep appointments with specialists.  Sleep studies.    Counseled patient on health concerns:  Fatty liver, sleep hygiene, hypothyroidism, BP, DM, depression, anxiety, family hx of breast cancer  Relevant handouts given and discussed with patient  Immunizations noted; pt will send flu vaccine report from her employer.     Offered empathy, support, legitamation, prayers, partnership to patient  Praised patient for progress  Follow-up Disposition:  Return in about 3 months (around 02/09/2014) for sleep, weight, med refill.    Patient was offered a choice/choices in the treatment plan today.  Patient expresses understanding of the plan and agrees with recommendations.    More than 70 mins spent face to face with patient and more than 50%  of this time spent in counseling and coordinating care.    Written by Coralie Carpen, scribe, as dictated by Dr. Felix Ahmadi, DO.      Documentation True and Accepted by Thandiwe Siragusa R. Lorel Monaco, D.O.      Patient Instructions       Anxiety Disorder: After Your Visit  Your Care Instructions  Anxiety is a normal reaction to stress. Difficult situations can cause you to have symptoms such as sweaty palms and a nervous feeling.  In an anxiety disorder, the symptoms are far more severe. Constant worry, muscle tension, trouble sleeping, nausea and diarrhea, and other symptoms can make normal daily activities difficult or impossible. These symptoms may occur for no reason, and they can affect your work, school, or social life. Medicines, counseling, and self-care can all help.  Follow-up care is a key part of your treatment and safety. Be sure to make and go to all appointments, and call your doctor if you are having  problems. It's also a good idea to know your test results and keep a list of the medicines you take.  How can you care for yourself at home?  ?? Take medicines exactly as directed. Call your doctor if you think you are having a problem with your medicine.  ?? Go to your counseling sessions and follow-up appointments.  ?? Recognize and accept your anxiety. Then, when you are in a situation that makes you anxious, say to yourself, "This is not an emergency. I feel uncomfortable, but I am not in danger. I can keep going even if I feel anxious."  ?? Be kind to your body:  ?? Relieve tension with exercise or a massage.  ?? Get enough rest.  ?? Avoid alcohol, caffeine, nicotine, and illegal drugs. They can increase your anxiety level and cause sleep problems.  ?? Learn and do relaxation techniques. See below for more about these techniques.  ?? Engage your mind. Get out and do something you enjoy. Go to a funny movie, or take a walk or hike. Plan your day. Having too much or too little to do can make you anxious.  ?? Keep a record of your symptoms. Discuss your fears with a good friend or family member, or join a support group for people with similar problems. Talking to others sometimes relieves stress.  ?? Get involved in social groups, or volunteer to help others. Being alone sometimes makes things seem worse than they are.  ?? Get at least 30 minutes of exercise on most days of the week to relieve stress. Walking is a good choice. You also may want to do other activities, such as running, swimming, cycling, or playing tennis or team sports.  Relaxation techniques  Do relaxation exercises 10 to 20 minutes a day. You can play soothing, relaxing music while you do them, if you wish.  ?? Tell others in your house that you are going to do your relaxation exercises. Ask them not to disturb you.  ?? Find a comfortable place, away from all distractions and noise.  ?? Lie down on your back, or sit with your back straight.   ?? Focus on your breathing. Make it slow and steady.  ?? Breathe in through your nose. Breathe out through either your nose or mouth.  ?? Breathe deeply, filling up the area between your navel and your rib cage. Breathe so that your belly goes up and down.  ?? Do not hold your breath.  ?? Breathe like this for  5 to 10 minutes. Notice the feeling of calmness throughout your whole body.  As you continue to breathe slowly and deeply, relax by doing the following for another 5 to 10 minutes:  ?? Tighten and relax each muscle group in your body. You can begin at your toes and work your way up to your head.  ?? Imagine your muscle groups relaxing and becoming heavy.  ?? Empty your mind of all thoughts.  ?? Let yourself relax more and more deeply.  ?? Become aware of the state of calmness that surrounds you.  ?? When your relaxation time is over, you can bring yourself back to alertness by moving your fingers and toes and then your hands and feet and then stretching and moving your entire body. Sometimes people fall asleep during relaxation, but they usually wake up shortly afterward.  ?? Always give yourself time to return to full alertness before you drive a car or do anything that might cause an accident if you are not fully alert. Never play a relaxation tape while you drive a car.  When should you call for help?  Call 911 anytime you think you may need emergency care. For example, call if:  ?? You feel you cannot stop from hurting yourself or someone else.  Watch closely for changes in your health, and be sure to contact your doctor if:  ?? You have anxiety or fear that affects your life.  ?? You have symptoms of anxiety that are new or different from those you had before.   Where can you learn more?   Go to [employer]  Enter P754 in the search box to learn more about "Anxiety Disorder: After Your Visit."   ?? 2006-2015 [employer]. Care instructions adapted under  license by [employer] (which disclaims liability or warranty for this information). This care instruction is for use with your licensed healthcare professional. If you have questions about a medical condition or this instruction, always ask your healthcare professional. [place] any warranty or liability for your use of this information.  Content Version: 10.5.422740; Current as of: November 21, 2012              Preventing a Relapse of Depression: After Your Visit  Your Care Instructions  A relapse of depression means your symptoms have come back after you have gotten better. This illness often comes and goes during a lifetime. But there are many things you can do to keep it from coming back.  Follow-up care is a key part of your treatment and safety. Be sure to make and go to all appointments, and call your doctor if you are having problems. It's also a good idea to know your test results and keep a list of the medicines you take.  What do you need to know?  Know your risk of relapse  Talk to your doctor to find out if you are at risk of relapse. Many things can make a person more likely to relapse into depression. These include having a family member with depression, dealing with serious problems in a relationship or a job, having a serious medical condition, or abusing drugs or alcohol.  It is important to know your risk and to recognize warning signs of relapse. Once you know these things, you will be better able to keep it from happening to you.  Know the warning signs of relapse  The two most common signs of relapse are:  ?? Feeling sad or hopeless.  ??  Losing interest in your daily activities.  You may have other symptoms, such as:  ?? You lose or gain weight.  ?? You sleep too much or not enough.  ?? You feel restless and unable to sit still.  ?? You feel unable to move.  ?? You feel tired all the time.  ?? You feel unworthy or guilty without an obvious reason.   ?? You have problems concentrating, remembering, or making decisions.  ?? You think often about death or suicide.  ?? You feel angry or have panic attacks.  How can you care for yourself at home?  ?? Take your medicine as prescribed. Call your doctor if you have any problems with your medicine. Many people take their medicines for at least 6 months after they have recovered. This often helps keep symptoms from coming back. However, if your depression keeps coming back, you may have to take medicine for the rest of your life.  ?? Continue counseling even after you have stopped taking medicine.  ?? Eat healthy foods. Include fruits, vegetables, beans, and whole grains in your diet each day.  ?? Get at least 30 minutes of exercise on most days of the week. Walking is a good choice. You also may want to do other activities, such as running, swimming, cycling, or playing tennis or team sports.  ?? See your doctor right away if you have new symptoms or feel that your depression is coming back.  ?? Keep a regular sleep schedule. Try for 8 hours of sleep a night.  ?? Avoid alcohol and illegal drugs.  ?? Keep the numbers for these national suicide hotlines: 1-800-273-TALK (715)084-6301) and 1-800-SUICIDE 657 599 5649). If you or someone you know talks about suicide or feeling hopeless, get help right away.  When should you call for help?  Call 911 anytime you think you may need emergency care. For example, call if:  ?? You are thinking about suicide or are threatening suicide.  ?? You feel you cannot stop from hurting yourself or someone else.  ?? You hear or see things that aren't real.  ?? You think or speak in a bizarre way that is not like your usual behavior.  Call your doctor now or seek immediate medical care if:  ?? You are drinking a lot of alcohol or using illegal drugs.  ?? You are talking or writing about death.  Watch closely for changes in your health, and be sure to contact your doctor if:   ?? You find it hard or it's getting harder to deal with school, a job, family, or friends.  ?? You think your treatment is not helping or you are not getting better.  ?? Your symptoms get worse or you get new symptoms.  ?? You have any problems with your antidepressant medicines, such as side effects, or you are thinking about stopping your medicine.  ?? You are having manic behavior, such as having very high energy, needing less sleep than normal, or showing risky behavior such as spending money you don't have or abusing others verbally or physically.   Where can you learn more?   Go to [employer]  Enter C630 in the search box to learn more about "Preventing a Relapse of Depression: After Your Visit."   ?? 2006-2015 Healthwise, Incorporated. Care instructions adapted under license by [employer] (which disclaims liability or warranty for this information). This care instruction is for use with your licensed healthcare professional. If you have questions about a medical condition  or this instruction, always ask your healthcare professional. [place] any warranty or liability for your use of this information.  Content Version: 10.5.422740; Current as of: November 21, 2012              Learning About CPAP for Sleep Apnea  What is CPAP?     CPAP is a small machine that you use at home every night while you sleep. It increases air pressure in your throat to keep your airway open. When you have sleep apnea, this can help you sleep better so you feel much better. CPAP stands for "continuous positive airway pressure."  The CPAP machine will have one of the following:  ?? A mask that covers your nose and mouth  ?? Prongs that fit into your nose  ?? A mask that covers your nose only, the most common type. This type is called NCPAP. The N stands for "nasal."  Why is it done?  CPAP is usually the best treatment for obstructive sleep apnea. It is the  first treatment choice and the most widely used. Your doctor may suggest CPAP if you have:  ?? Moderate to severe sleep apnea.  ?? Sleep apnea and coronary artery disease (CAD) or heart failure.  How does it help?  ?? CPAP can help you have more normal sleep, so you feel less sleepy and more alert during the daytime.  ?? CPAP may help keep heart failure or other heart problems from getting worse.  ?? CPAP may help lower your blood pressure.  ?? If you use CPAP, your bed partner may also sleep better because you are not snoring or restless.  What are the side effects?  Some people who use CPAP have:  ?? A dry or stuffy nose and a sore throat.  ?? Irritated skin on the face.  ?? Sore eyes.  ?? Bloating.  If you have any of these problems, work with your doctor to fix them. Here are some things you can try:  ?? Be sure the mask or nasal prongs fit well.  ?? See if your doctor can adjust the pressure of your CPAP.  ?? If your nose is dry, try a humidifier.  ?? If your nose is runny or stuffy, try decongestant medicine or a steroid nasal spray. Be safe with medicines. Read and follow all instructions on the label. Do not use the medicine longer than the label says.  If these things do not help, you might try a different type of machine. Some machines have air pressure that adjusts on its own. Others have air pressures that are different when you breathe in than when you breathe out. This may reduce discomfort caused by too much pressure in your nose.   Where can you learn more?   Go to [employer]Nylon.com.cy  Enter X266 in the search box to learn more about "Learning About CPAP for Sleep Apnea."   ?? 2006-2015 Healthwise, Incorporated. Care instructions adapted under license by [employer] (which disclaims liability or warranty for this information). This care instruction is for use with your licensed healthcare professional. If you have questions about a medical condition  or this instruction, always ask your healthcare professional. [place] any warranty or liability for your use of this information.  Content Version: 10.5.422740; Current as of: September 16, 2012              Self-Care While You Recover From Depression: After Your Visit  Your Care Instructions  Taking  good care of yourself is important as you recover from depression. In time, your symptoms will fade as your treatment takes hold. Do not give up. Instead, focus your energy on getting better.  Your mood will improve. It just takes some time. Focus on things that can help you feel better, such as being with friends and family, eating well, and getting enough rest. But take things slowly. Do not do too much too soon. You will begin to feel better gradually.  Follow-up care is a key part of your treatment and safety. Be sure to make and go to all appointments, and call your doctor if you are having problems. It's also a good idea to know your test results and keep a list of the medicines you take.  How can you care for yourself at home?  Be realistic  ?? If you have a large task to do, break it up into smaller steps you can handle, and just do what you can.  ?? You may want to put off important decisions until your depression has lifted. If you have plans that will have a major impact on your life, such as marriage, divorce, or a job change, try to wait a bit. Talk it over with friends and loved ones who can help you look at the overall picture first.  ?? Reaching out to people for help is important. Do not isolate yourself. Let your family and friends help you. Find someone you can trust and confide in, and talk to that person.  ?? Be patient, and be kind to yourself. Remember that depression is not your fault and is not something you can overcome with willpower alone. Treatment is necessary for depression, just like for any other illness.  Feeling better takes time, and your mood will improve little by little.  Stay active  ?? Stay busy and get outside. Take a walk, or try some other light exercise.  ?? Talk with your doctor about an exercise program. Exercise can help with mild depression.  ?? Go to a movie or concert. Take part in a church activity or other social gathering. Go to a ball game.  ?? Ask a friend to have dinner with you.  Take care of yourself  ?? Eat a balanced diet with plenty of fresh fruits and vegetables, whole grains, and lean protein. If you have lost your appetite, eat small snacks rather than large meals.  ?? Avoid drinking alcohol or using illegal drugs. Do not take medicines that have not been prescribed for you. They may interfere with medicines you may be taking for depression, or they may make your depression worse.  ?? Take your medicines exactly as they are prescribed. You may start to feel better within 1 to 3 weeks of taking antidepressant medicine. But it can take as many as 6 to 8 weeks to see more improvement. If you have questions or concerns about your medicines, or if you do not notice any improvement by 3 weeks, talk to your doctor.  ?? If you have any side effects from your medicine, tell your doctor. Antidepressants can make you feel tired, dizzy, or nervous. Some people have dry mouth, constipation, headaches, sexual problems, or diarrhea. Many of these side effects are mild and will go away on their own after you have been taking the medicine for a few weeks. Some may last longer. Talk to your doctor if side effects are bothering you too much. You might be able to try a  different medicine.  ?? Get enough sleep. If you have problems sleeping:  ?? Go to bed at the same time every night, and get up at the same time every morning.  ?? Keep your bedroom dark and quiet.  ?? Do not exercise after 5:00 p.m.  ?? Avoid drinks with caffeine after 5:00 p.m.  ?? Avoid sleeping pills unless they are prescribed by the doctor treating  your depression. Sleeping pills may make you groggy during the day, and they may interact with other medicine you are taking.  ?? If you have any other illnesses, such as diabetes, heart disease, or high blood pressure, make sure to continue with your treatment. Tell your doctor about all of the medicines you take, including those with or without a prescription.  ?? Keep the numbers for these national suicide hotlines: 1-800-273-TALK 316 769 6146) and 1-800-SUICIDE 646-006-3157). If you or someone you know talks about suicide or feeling hopeless, get help right away.  When should you call for help?  Call 911 anytime you think you may need emergency care. For example, call if:  ?? You feel like hurting yourself or someone else.  ?? Someone you know has depression and is about to attempt or is attempting suicide.  Call your doctor now or seek immediate medical care if:  ?? You hear voices.  ?? Someone you know has depression and:  ?? Starts to give away his or her possessions.  ?? Uses illegal drugs or drinks alcohol heavily.  ?? Talks or writes about death, including writing suicide notes or talking about guns, knives, or pills.  ?? Starts to spend a lot of time alone.  ?? Acts very aggressively or suddenly appears calm.  Watch closely for changes in your health, and be sure to contact your doctor if:  ?? You do not get better as expected.   Where can you learn more?   Go to GreenNylon.[email or web address]  Enter N529 in the search box to learn more about "Self-Care While You Recover From Depression: After Your Visit."   ?? 2006-2015 Healthwise, Incorporated. Care instructions adapted under license by [employer] (which disclaims liability or warranty for this information). This care instruction is for use with your licensed healthcare professional. If you have questions about a medical condition or this instruction, always ask your healthcare professional. [place] any warranty or liability for your use of this information.  Content Version: 10.5.422740; Current as of: November 21, 2012              Diabetes Foot Care: After Your Visit  Your Care Instructions     When you have diabetes, your feet need extra care and attention. Diabetes can damage the nerve endings and blood vessels in your feet, making you less likely to notice when your feet are injured. Diabetes also limits your body's ability to fight infection and get blood to areas that need it. If you get a minor foot injury, it could become an ulcer or a serious infection. With good foot care, you can prevent most of these problems.  Caring for your feet can be quick and easy. Most of the care can be done when you are bathing or getting ready for bed.  Follow-up care is a key part of your treatment and safety. Be sure to make and go to all appointments, and call your doctor if you are having problems. It???s also a good idea to know your test results and keep a list of the medicines  you take.  How can you care for yourself at home?  ?? Keep your blood sugar close to normal by watching what and how much you eat, monitoring blood sugar, taking medicines if prescribed, and getting regular exercise.  ?? Do not smoke. Smoking affects blood flow and can make foot problems worse. If you need help quitting, talk to your doctor about stop-smoking programs and medicines. These can increase your chances of quitting for good.  ?? Eat a diet that is low in fats. High fat intake can cause fat to build up in your blood vessels and decrease blood flow.  ?? Inspect your feet daily for blisters, cuts, cracks, or sores. If you cannot see well, use a mirror or have someone help you.  ?? Take care of your feet:  ?? Wash your feet every day. Use warm (not hot) water. Check the water temperature with your wrists or other part of your body, not your feet.  ?? Dry your feet well. Pat them dry. Do not rub the skin on your feet too  hard. Dry well between your toes. If the skin on your feet stays moist, bacteria or a fungus can grow, which can lead to infection.  ?? Keep your skin soft. Use moisturizing skin cream to keep the skin on your feet soft and prevent calluses and cracks. But do not put the cream between your toes, and stop using any cream that causes a rash.  ?? Clean underneath your toenails carefully. Do not use a sharp object to clean underneath your toenails. Use the blunt end of a nail file or other rounded tool.  ?? Trim and file your toenails straight across to prevent ingrown toenails. Use a nail clipper, not scissors. Use an emery board to smooth the edges.  ?? Change socks daily. Socks without seams are best, because seams often rub the feet. You can find socks for people with diabetes from specialty catalogs.  ?? Look inside your shoes every day for things like gravel or torn linings, which could cause blisters or sores.  ?? Buy shoes that fit well:  ?? Look for shoes that have plenty of space around the toes. This helps prevent bunions and blisters.  ?? Try on shoes while wearing the kind of socks you will usually wear with the shoes.  ?? Avoid plastic shoes. They may rub your feet and cause blisters. Good shoes should be made of materials that are flexible and breathable, such as leather or cloth.  ?? Break in new shoes slowly by wearing them for no more than an hour a day for several days. Take extra time to check your feet for red areas, blisters, or other problems after you wear new shoes.  ?? Do not go barefoot. Do not wear sandals, and do not wear shoes with very thin soles. Thin soles are easy to puncture. They also do not protect your feet from hot pavement or cold weather.  ?? Have your doctor check your feet during each visit. If you have a foot problem, see your doctor. Do not try to treat an early foot problem at home. Home remedies or treatments that you can buy without a prescription  (such as corn removers) can be harmful.  ?? Always get early treatment for foot problems. A minor irritation can lead to a major problem if not properly cared for early.  When should you call for help?  Call your doctor now or seek immediate medical care if:  ??  You have a foot sore, an ulcer or break in the skin that is not healing after 4 days, bleeding corns or calluses, or an ingrown toenail.  ?? You have blue or black areas, which can mean bruising or blood flow problems.  ?? You have peeling skin or tiny blisters between your toes or cracking or oozing of the skin.  ?? You have a fever for more than 24 hours and a foot sore.  ?? You have new numbness or tingling in your feet that does not go away after you move your feet or change positions.  ?? You have unexplained or unusual swelling of the foot or ankle.  Watch closely for changes in your health, and be sure to contact your doctor if:  ?? You cannot do proper foot care.   Where can you learn more?   Go to [email or web address].[email or web address]  Enter A739 in the search box to learn more about "Diabetes Foot Care: After Your Visit."   ?? 2006-2015 Healthwise, Incorporated. Care instructions adapted under license by [employer] (which disclaims liability or warranty for this information). This care instruction is for use with your licensed healthcare professional. If you have questions about a medical condition or this instruction, always ask your healthcare professional. [place] any warranty or liability for your use of this information.  Content Version: 10.5.422740; Current as of: November 21, 2012              DASH Diet: After Your Visit  Your Care Instructions  The DASH diet is an eating plan that can help lower your blood pressure. DASH stands for Dietary Approaches to Stop Hypertension. Hypertension is high blood pressure.  The DASH diet focuses on eating foods that are high in calcium, potassium,  and magnesium. These nutrients can lower blood pressure. The foods that are highest in these nutrients are fruits, vegetables, low-fat dairy products, nuts, seeds, and legumes. But taking calcium, potassium, and magnesium supplements instead of eating foods that are high in those nutrients does not have the same effect. The DASH diet also includes whole grains, fish, and poultry.  The DASH diet is one of several lifestyle changes your doctor may recommend to lower your high blood pressure. Your doctor may also want you to decrease the amount of sodium in your diet. Lowering sodium while following the DASH diet can lower blood pressure even further than just the DASH diet alone.  Follow-up care is a key part of your treatment and safety. Be sure to make and go to all appointments, and call your doctor if you are having problems. It's also a good idea to know your test results and keep a list of the medicines you take.  How can you care for yourself at home?  Following the DASH diet  ?? Eat 4 to 5 servings of fruit each day. A serving is 1 medium-sized piece of fruit, ?? cup chopped or canned fruit, 1/4 cup dried fruit, or 4 ounces (?? cup) of fruit juice. Choose fruit more often than fruit juice.  ?? Eat 4 to 5 servings of vegetables each day. A serving is 1 cup of lettuce or raw leafy vegetables, ?? cup of chopped or cooked vegetables, or 4 ounces (?? cup) of vegetable juice. Choose vegetables more often than vegetable juice.  ?? Get 2 to 3 servings of low-fat and fat-free dairy each day. A serving is 8 ounces of milk, 1 cup of yogurt, or 1 ?? ounces of cheese.  ??  Eat 6 to 8 servings of grains each day. A serving is 1 slice of bread, 1 ounce of dry cereal, or ?? cup of cooked rice, pasta, or cooked cereal. Try to choose whole-grain products as much as possible.  ?? Limit lean meat, poultry, and fish to 2 servings each day. A serving is 3 ounces, about the size of a deck of cards.   ?? Eat 4 to 5 servings of nuts, seeds, and legumes (cooked dried beans, lentils, and split peas) each week. A serving is 1/3 cup of nuts, 2 tablespoons of seeds, or ?? cup of cooked beans or peas.  ?? Limit fats and oils to 2 to 3 servings each day. A serving is 1 teaspoon of vegetable oil or 2 tablespoons of salad dressing.  ?? Limit sweets and added sugars to 5 servings or less a week. A serving is 1 tablespoon jelly or jam, ?? cup sorbet, or 1 cup of lemonade.  ?? Eat less than 2,300 milligrams (mg) of sodium a day. If you have high blood pressure, diabetes, or chronic kidney disease, if you are African-American, or if you are older than age 84, try to limit the amount of sodium you eat to less than 1,500 mg a day.  Tips for success  ?? Start small. Do not try to make dramatic changes to your diet all at once. You might feel that you are missing out on your favorite foods and then be more likely to not follow the plan. Make small changes, and stick with them. Once those changes become habit, add a few more changes.  ?? Try some of the following:  ?? Make it a goal to eat a fruit or vegetable at every meal and at snacks. This will make it easy to get the recommended amount of fruits and vegetables each day.  ?? Try yogurt topped with fruit and nuts for a snack or healthy dessert.  ?? Add lettuce, tomato, cucumber, and onion to sandwiches.  ?? Combine a ready-made pizza crust with low-fat mozzarella cheese and lots of vegetable toppings. Try using tomatoes, squash, spinach, broccoli, carrots, cauliflower, and onions.  ?? Have a variety of cut-up vegetables with a low-fat dip as an appetizer instead of chips and dip.  ?? Sprinkle sunflower seeds or chopped almonds over salads. Or try adding chopped walnuts or almonds to cooked vegetables.  ?? Try some vegetarian meals using beans and peas. Add garbanzo or kidney beans to salads. Make burritos and tacos with mashed pinto beans or black beans.   Where can you learn more?    Go to GreenNylon.com.cy  Enter H967 in the search box to learn more about "DASH Diet: After Your Visit."   ?? 2006-2015 Healthwise, Incorporated. Care instructions adapted under license by [employer] (which disclaims liability or warranty for this information). This care instruction is for use with your licensed healthcare professional. If you have questions about a medical condition or this instruction, always ask your healthcare professional. [place] any warranty or liability for your use of this information.  Content Version: 10.5.422740; Current as of: February 27, 2013        WEIGHT LOSS PLAN    1.  Read food labels and count calories.  Goal 1000-1200 calories daily for women and 1200-1600 calories daily for men.  Achieve this by decreasing caloric intake by 500 calories by weekly increments.  NOTE:  1 pound = 3500 calories!  Www.loseit.com  BikingRewards.pl  Www.sparkpeople.com  Www.healthfinder.gov  Weight watchers mobile    Calories needed to lose 1-2 pounds a week = 10 x your weight in pounds    2.  Increase water intake.    Per [facility], it is important to drink 1/2 your body weight in ounces of water daily.  Decrease your water consumption 2-3 hours before bedtime to prevent sleep disturbance from frequent urination.    3.  Decrease sugary beverages.  Each can or glass of soda increases your risk of obesity by 60%.  Can lose 10 pounds in a month by avoiding any soda.     12 oz can of soda = 140 calories, 16 oz cup of sweet tea = 200 calories    16 oz orange juice = 200 calories, 10 oz apple juice = 150 calories   32 oz sports drink = 200 calories, 16 oz punch = 240 calories   3.5 oz alcohol = 100-150 calories     4.  Avoid High Fructose Corn Syrup products.  This ingredient makes products highly addictive!    5.  Exercise 30 minutes daily 5 days weekly, minimally.  If you burn 3500  calories that equals a pound!  Use a pedometer to count steps. Visit www.JustKeepMoving.com for a free pedometer and diet recommendations.      Your maximum heart rate = 220 - your age    Never exercise at your maximum heart rate.    See handout for target heart rate.    6.  Decrease carbohydrates (white bread, pasta, rice, potatoes, sweet foods and sweet drinks like soda, tea, coffee, juice and sports drinks).  Increase fiber and protein.    Goal:  50-150 calories daily of carbohydrates     Try CHROMIUM PICONATE 200 MG THREE TIMES DAILY,    to decrease Premenstrual carbohydrate cravings.    7.  Eat 3-5 small meals daily, include lots of protein (beans/legumes, nuts, lean meat, eggs) and green vegetables with each.  (Breakfast, lunch, dinner with 2 healthy snacks)    8.  Get proper rest 7-8 hours uninterrupted.  When you get less than 6 hours, it triggers hunger by affecting your [place]:Leptin ratio and this results in weight gain.    9.  Watch your food portions.  Green leafy vegetables should cover 1/2 of the plate, lean meat 1/4 of the plate, starchy vegetable 1/4 of the plate.  Use smaller plates.    10.  Do not eat until you are full.  Eat until you are no longer hungry.  If you are not sure, try drinking a glass of water before getting your second serving of food.    11.  Do not weigh yourself daily.  Wait until your next office visit.  Use how you feel and  how your clothes fit as measurements of success.    12.  Address your spirituality to draw strength from above during your journey.  Remember "I am fearfully and wonderfully made.  Marvelous in His eyes."    13.  Set realistic, appropriate and achievable weight loss goals:     RECOMMENDED TARGET WEIGHT LOSS:  Initial weight loss of 5-10% of your initial body weight achieved over 6 months or a decrease of 2 BMI units.     MINIMUM GOAL OF WEIGHT LOSS:  Reduce body weight and maintain a lower body weight.  Prevent weight gain.          RELATED WEBSITES:       Www.obesityaction.org  (  and consider joining the Obesity Action Coalition for $25/year.  The [facility] mission is to elevated and empower those affected by obesity through education, advocacy and support.  Quarterly journals included in membership fee.)    Www.thebiggestloser.com  PatentHood.ch     RELATED DIETS:  Dr. Allene Dillon [place] Weight loss challenge, Mediterranean diet, [place], [employer] Watchers, Paleo Diet (anti-inflammatory diet)        EXERCISE 150 MINUTES WEEKLY.  THIS CAN BE ACHIEVED BY WORKING OUT OR WALKING A MINIMUM OF 30 MINUTES FOR 5 DAYS WEEKLY.  YOU CAN EXERCISE IN INCREMENTS OF 10-15 MINUTES UP TO 3 TIMES A DAY.    Consider performing "brainless exercise."  Choose your favorite tv program.  Five minutes before and for 5 minutes after the tv program, stretch your body.  While the program is on, walk in place watching the show.  When commercials come on, rest or walk around the house to do other things.  When the program begins, return to walking in place.  When you are able keep walking during the commercials and add light weights to your ankles or hands.  By the end of the show, you would have walked 30 minutes.  If you need shorter spurts of exercise, walk during the commercials and rest during the show.    Drink to glasses of water prior to any exercise to prevent dehydration and to improve the results of the work out.      Your RESTING HEART RATE is the number of times your heart beats per minute when you are not exerting yourself.  The more fit you are, the lower your resting heart rate will be.    Your MAXIMUM HEART RATE is the number of times per minute your heart pumps when it is working at 100% capacity.  NEVER EXERCISE AT YOUR MAXIMUM HEART RATE.    MAX HEART RATE = 220 - your age    For example, in a 46 year old, the maximum heart rate is 220-50 = 170 beats per minute    Your [place] is the number of beats per minute our heart should  pump during aerobic exercise.  Reaching your target heart rate indicates that your body is receiving maximum cardiovascular and fat burning benefits.     If you are fit, your TARGET HEART RATE = 70-85% of your maximum Heart rate      For a 48 year old with vigorous intensity physical activity,         Target heart rate= 170 bpm x .70 = 119 bpm     Target heart rate = 170 bpm x .85=  145 bpm        Therefore, your target heart rate during physical activity is 119-145      If you are not fit, TARGET HEART RATE = 50-70% of your maximum Heart rate    MODERATE CONSISTENT AEROBIC EXERCISE OR WALKING FOR AT LEAST 150 MINUTES WEEKLY IS AN ESSENTIAL PART OF ANY WEIGHT LOSS OF WEIGHT MAINTENANCE PROGRAM.     During WEIGHT LOSS PHASE, at least 75% of your exercise needs to be walking or moderate aerobic activity and 25% or 0% can be Isometric or Resistance type exercises (the latter can be deferred to the weight maintenance phase.)     During WEIGHT MAINTENANCE PHASE, at least 50% of your exercise needs to be aerobic and 50% Isometric or Resistance type exercises.     BENEFITS OF MODERATE INTENSITY EXERCISE  MOST DAYS OF THE WEEK:     1.  Insulin Resistance improves 30-85%   2.  Abdominal Fat decreases by 30%   3.  Inflammatory Markers decrease by 30% (therefore pain decreases)   4.  Systolic and Diastolic Blood Pressure decrease by 4 mmHg   5.  HDL improves by 5%   6.  Triglycerides decrease by 15%   7.  Shift from small dense LDL to large dense LDL (therefore decrease Insulin Resistance)   8.  Decrease coagulability                   DASH Diet: After Your Visit  Your Care Instructions  The DASH diet is an eating plan that can help lower your blood pressure. DASH stands for Dietary Approaches to Stop Hypertension. Hypertension is high blood pressure.  The DASH diet focuses on eating foods that are high in calcium, potassium, and magnesium. These nutrients can lower blood pressure. The foods that  are highest in these nutrients are fruits, vegetables, low-fat dairy products, nuts, seeds, and legumes. But taking calcium, potassium, and magnesium supplements instead of eating foods that are high in those nutrients does not have the same effect. The DASH diet also includes whole grains, fish, and poultry.  The DASH diet is one of several lifestyle changes your doctor may recommend to lower your high blood pressure. Your doctor may also want you to decrease the amount of sodium in your diet. Lowering sodium while following the DASH diet can lower blood pressure even further than just the DASH diet alone.  Follow-up care is a key part of your treatment and safety. Be sure to make and go to all appointments, and call your doctor if you are having problems. It's also a good idea to know your test results and keep a list of the medicines you take.  How can you care for yourself at home?  Following the DASH diet  ?? Eat 4 to 5 servings of fruit each day. A serving is 1 medium-sized piece of fruit, ?? cup chopped or canned fruit, 1/4 cup dried fruit, or 4 ounces (?? cup) of fruit juice. Choose fruit more often than fruit juice.  ?? Eat 4 to 5 servings of vegetables each day. A serving is 1 cup of lettuce or raw leafy vegetables, ?? cup of chopped or cooked vegetables, or 4 ounces (?? cup) of vegetable juice. Choose vegetables more often than vegetable juice.  ?? Get 2 to 3 servings of low-fat and fat-free dairy each day. A serving is 8 ounces of milk, 1 cup of yogurt, or 1 ?? ounces of cheese.  ?? Eat 6 to 8 servings of grains each day. A serving is 1 slice of bread, 1 ounce of dry cereal, or ?? cup of cooked rice, pasta, or cooked cereal. Try to choose whole-grain products as much as possible.  ?? Limit lean meat, poultry, and fish to 2 servings each day. A serving is 3 ounces, about the size of a deck of cards.  ?? Eat 4 to 5 servings of nuts, seeds, and legumes (cooked dried beans,  lentils, and split peas) each week. A serving is 1/3 cup of nuts, 2 tablespoons of seeds, or ?? cup of cooked beans or peas.  ?? Limit fats and oils to 2 to 3 servings each day. A serving is 1 teaspoon of vegetable oil or 2 tablespoons of salad dressing.  ?? Limit sweets and added sugars to 5 servings or  less a week. A serving is 1 tablespoon jelly or jam, ?? cup sorbet, or 1 cup of lemonade.  ?? Eat less than 2,300 milligrams (mg) of sodium a day. If you have high blood pressure, diabetes, or chronic kidney disease, if you are African-American, or if you are older than age 68, try to limit the amount of sodium you eat to less than 1,500 mg a day.  Tips for success  ?? Start small. Do not try to make dramatic changes to your diet all at once. You might feel that you are missing out on your favorite foods and then be more likely to not follow the plan. Make small changes, and stick with them. Once those changes become habit, add a few more changes.  ?? Try some of the following:  ?? Make it a goal to eat a fruit or vegetable at every meal and at snacks. This will make it easy to get the recommended amount of fruits and vegetables each day.  ?? Try yogurt topped with fruit and nuts for a snack or healthy dessert.  ?? Add lettuce, tomato, cucumber, and onion to sandwiches.  ?? Combine a ready-made pizza crust with low-fat mozzarella cheese and lots of vegetable toppings. Try using tomatoes, squash, spinach, broccoli, carrots, cauliflower, and onions.  ?? Have a variety of cut-up vegetables with a low-fat dip as an appetizer instead of chips and dip.  ?? Sprinkle sunflower seeds or chopped almonds over salads. Or try adding chopped walnuts or almonds to cooked vegetables.  ?? Try some vegetarian meals using beans and peas. Add garbanzo or kidney beans to salads. Make burritos and tacos with mashed pinto beans or black beans.   Where can you learn more?   Go to [employer].cy   Enter H967 in the search box to learn more about "DASH Diet: After Your Visit."   ?? 2006-2015 Healthwise, Incorporated. Care instructions adapted under license by [employer] (which disclaims liability or warranty for this information). This care instruction is for use with your licensed healthcare professional. If you have questions about a medical condition or this instruction, always ask your healthcare professional. [place] any warranty or liability for your use of this information.  Content Version: 10.5.422740; Current as of: February 27, 2013              Insomnia: After Your Visit  Your Care Instructions  Insomnia is the inability to sleep well. It is a common problem for most people at some time. Insomnia may make it hard for you to get to sleep, stay asleep, or sleep as long as you need to. This can make you tired and grouchy during the day. It can also make you forgetful, less effective at work, and unhappy.  Insomnia can be caused by conditions such as depression or anxiety. Pain can also affect your ability to sleep. When these problems are solved, the insomnia usually clears up. But sometimes bad sleep habits can cause insomnia.  If insomnia is affecting your work or your enjoyment of life, you can take steps to improve your sleep.  Follow-up care is a key part of your treatment and safety. Be sure to make and go to all appointments, and call your doctor if you are having problems. It???s also a good idea to know your test results and keep a list of the medicines you take.  How can you care for yourself at home?  What to avoid   ?? Do not have  drinks with caffeine, such as coffee or black tea, for 8 hours before bed.  ?? Do not smoke or use other types of tobacco near bedtime. Nicotine is a stimulant and can keep you awake.  ?? Avoid drinking alcohol late in the evening, because it can cause you to wake in the middle of the night.   ?? Do not eat a big meal close to bedtime. If you are hungry, eat a light snack.  ?? Do not drink a lot of water close to bedtime, because the need to urinate may wake you up during the night.  ?? Do not read or watch TV in bed. Use the bed only for sleeping and sexual activity.  What to try   ?? Go to bed at the same time every night, and wake up at the same time every morning. Do not take naps during the day.  ?? Keep your bedroom quiet, dark, and cool.  ?? Get regular exercise, but not within 3 to 4 hours of your bedtime.  ?? Sleep on a comfortable pillow and mattress.  ?? If watching the clock makes you anxious, turn it facing away from you so you cannot see the time.  ?? If you worry when you lie down, start a worry book. Well before bedtime, write down your worries, and then set the book and your concerns aside.  ?? Try meditation or other relaxation techniques before you go to bed.  ?? If you cannot fall asleep, get up and go to another room until you feel sleepy. Do something relaxing. Repeat your bedtime routine before you go to bed again.  ?? Make your house quiet and calm about an hour before bedtime. Turn down the lights, turn off the TV, log off the computer, and turn down the volume on music. This can help you relax after a busy day.  When should you call for help?  Watch closely for changes in your health, and be sure to contact your doctor if:  ?? Your efforts to improve your sleep do not work.  ?? Your insomnia gets worse.  ?? You have been feeling down, depressed, or hopeless or have lost interest in things that you usually enjoy.   Where can you learn more?   Go to [employer]  Enter P513 in the search box to learn more about "Insomnia: After Your Visit."   ?? 2006-2015 Healthwise, Inc[employer]. Care instructions adapted under license by [employer] (which disclaims liability or warranty for this information). This care instruction is for use with your licensed  healthcare professional. If you have questions about a medical condition or this instruction, always ask your healthcare professional. [place] any warranty or liability for your use of this information.  Content Version: 10.5.422740; Current as of: November 21, 2012       SLEEP HYGIENE    1.  Try to maintain a regular schedule for bedtime, rise time, meals, exercise, chores, etc.  The body's internal clock or Circadian Rhythm works best when set schedules are kept.  If you stay up later on weekends, try not to sleep more than an hour past your usual wake time.  ALLOW AT LEAST 7 HOURS UNINTERRUPTED SLEEP.    2.  Try an OTC sleep agent (Unisom, Benadryl, Melatonin 0.5-1 mg, etc.) nightly for 2 weeks, if okay with your doctor.  Take 1 pill 30 minutes before your chosen bedtime.  This will help to reset your sleep/wake cycle.  Absolutely no driving  once this medicine is taken.      3.  AVOID stimulants such as caffeine (coffee, tea, cocoa, chocolate, colas) and medications like No Doz or ADHD medication after lunchtime or 4-8 hours before bedtime.  Heavy caffeine use early in the day can also disrupt sleep.    4.  AVOID alcoholic beverages 6 hours prior to bedtime.  It can cause frequent awakenings in the night as your body gets rid of the alcohol.    5.  AVOID heavy meals before bedtime.  Try to eat dinner 2-3 hours before bedtime.  A light snack which includes dairy products is a good idea since these chemicals (tryptophan) promote drowsiness.  Do not do this if you are intolerant to dairy products.    6.  AVOID exercising before bedtime.  Exercise should be performed preferably in light during the day.    7.  AVOID napping during the day, especially after 3 pm.  If a nap is needed, limit daytime naps to 1 hour.    8.  AVOID nicotine prior to bed.  Withdrawal from nicotine can cause temporary sleep disruptions.  Once withdrawal is complete, the ex-smoker  will probably find that sleep comes faster.    9.  AVOID laying in bed awake for long periods of time.  Ger up, do a simple activity, then return to the bedroom when you are drowsy.    10.  AVOID clock watching.  Remove easily visible clocks from the room or place them where they are not easily visible.  Clock watching adds stress to this situation.    11.  AVOID bright lights and loud music in the bedroom.  Make the room dark and quiet.  Sleep is the only thing you should do in your bed.  Light signals your brain to be alert.    12.  AVOID warm rooms when trying to sleep.  Keep the bedroom well-ventilated and on the cool side.  Feeling too warm can cause night time waking.    13. AVOID drinking large amounts of water at bedtime.  Decrease beverage consumption  2-3 hours before bedtime.   Use the bathroom to empty your bladder before getting into bed.    14.  Take an over the counter sleep agent such as Tylenol PM or Advil PM if pain is keeping you awake at night, if okay with your doctor.          Insomnia: After Your Visit  Your Care Instructions  Insomnia is the inability to sleep well. It is a common problem for most people at some time. Insomnia may make it hard for you to get to sleep, stay asleep, or sleep as long as you need to. This can make you tired and grouchy during the day. It can also make you forgetful, less effective at work, and unhappy.  Insomnia can be caused by conditions such as depression or anxiety. Pain can also affect your ability to sleep. When these problems are solved, the insomnia usually clears up. But sometimes bad sleep habits can cause insomnia.  If insomnia is affecting your work or your enjoyment of life, you can take steps to improve your sleep.  Follow-up care is a key part of your treatment and safety. Be sure to make and go to all appointments, and call your doctor if you are having problems. It???s also a good idea to know your test results and keep a list  of the medicines you take.  How can  you care for yourself at home?  What to avoid   ?? Do not have drinks with caffeine, such as coffee or black tea, for 8 hours before bed.  ?? Do not smoke or use other types of tobacco near bedtime. Nicotine is a stimulant and can keep you awake.  ?? Avoid drinking alcohol late in the evening, because it can cause you to wake in the middle of the night.  ?? Do not eat a big meal close to bedtime. If you are hungry, eat a light snack.  ?? Do not drink a lot of water close to bedtime, because the need to urinate may wake you up during the night.  ?? Do not read or watch TV in bed. Use the bed only for sleeping and sexual activity.  What to try   ?? Go to bed at the same time every night, and wake up at the same time every morning. Do not take naps during the day.  ?? Keep your bedroom quiet, dark, and cool.  ?? Get regular exercise, but not within 3 to 4 hours of your bedtime.  ?? Sleep on a comfortable pillow and mattress.  ?? If watching the clock makes you anxious, turn it facing away from you so you cannot see the time.  ?? If you worry when you lie down, start a worry book. Well before bedtime, write down your worries, and then set the book and your concerns aside.  ?? Try meditation or other relaxation techniques before you go to bed.  ?? If you cannot fall asleep, get up and go to another room until you feel sleepy. Do something relaxing. Repeat your bedtime routine before you go to bed again.  ?? Make your house quiet and calm about an hour before bedtime. Turn down the lights, turn off the TV, log off the computer, and turn down the volume on music. This can help you relax after a busy day.  When should you call for help?  Watch closely for changes in your health, and be sure to contact your doctor if:  ?? Your efforts to improve your sleep do not work.  ?? Your insomnia gets worse.  ?? You have been feeling down, depressed, or hopeless or have lost interest  in things that you usually enjoy.   Where can you learn more?   Go to [employer].com.cy  Enter P513 in the search box to learn more about "Insomnia: After Your Visit."   ?? 2006-2015 Healthwise, Incorporated. Care instructions adapted under license by [employer] (which disclaims liability or warranty for this information). This care instruction is for use with your licensed healthcare professional. If you have questions about a medical condition or this instruction, always ask your healthcare professional. [place] any warranty or liability for your use of this information.  Content Version: 10.5.422740; Current as of: November 21, 2012

## 2013-11-16 NOTE — Telephone Encounter (Signed)
Last office visit 11-09-2013  Next office visit 01-11-2014  Last lab 07-31-2013  TSH: 1.210              T4: 1.34

## 2013-11-19 MED ORDER — LEVOTHYROXINE 88 MCG TAB
88 mcg | ORAL_TABLET | Freq: Every day | ORAL | Status: DC
Start: 2013-11-19 — End: 2014-08-27

## 2013-12-08 ENCOUNTER — Encounter: Attending: Pediatric Pulmonology | Primary: Family Medicine

## 2013-12-11 ENCOUNTER — Encounter: Attending: Pediatric Pulmonology | Primary: Family Medicine

## 2014-01-11 ENCOUNTER — Encounter: Attending: Family Medicine | Primary: Family Medicine

## 2014-02-08 ENCOUNTER — Inpatient Hospital Stay
Admit: 2014-02-08 | Discharge: 2014-02-08 | Disposition: A | Discharge status: Home / Self Care | Payer: BLUE CROSS/BLUE SHIELD | Attending: Family Medicine

## 2014-02-08 DIAGNOSIS — J209 Acute bronchitis, unspecified: Secondary | ICD-10-CM

## 2014-02-08 MED ORDER — FLUTICASONE 50 MCG/ACTUATION NASAL SPRAY, SUSP
50 mcg/actuation | Freq: Every day | NASAL | Status: DC
Start: 2014-02-08 — End: 2014-09-17

## 2014-02-08 MED ORDER — BENZONATATE 200 MG CAP
200 mg | ORAL_CAPSULE | Freq: Three times a day (TID) | ORAL | Status: AC | PRN
Start: 2014-02-08 — End: 2014-02-15

## 2014-02-08 MED ORDER — AMOXICILLIN 875 MG TAB
875 mg | ORAL_TABLET | Freq: Two times a day (BID) | ORAL | Status: AC
Start: 2014-02-08 — End: 2014-02-18

## 2014-02-08 NOTE — Other (Signed)
Patient is a 49 y.o. female presenting with cough. The history is provided by the patient.   Cough  This is a new problem. The current episode started more than 1 week ago. The problem occurs every few minutes. The problem has not changed since onset.The cough is non-productive. There has been no fever. Associated symptoms include ear congestion, headaches, rhinorrhea and sore throat. Pertinent negatives include no chest pain, no chills, no shortness of breath, no wheezing, no nausea and no vomiting. She has tried cough syrup for the symptoms. The treatment provided mild relief. She is not a smoker. Her past medical history is significant for bronchitis. Her past medical history does not include asthma.        Past Medical History   Diagnosis Date   ??? Obesity 07/25/2007     Improved with Lifestyle Modifications   ??? Dyslipidemia Apr 09, 2007     Improving with weight loss   ??? Fatty liver 07/25/2007     with elevated AST, ALT.  In NASH study at [facility] (Dr. Hilary Hertz)   ??? 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  fiance's death 04-09-1995).     ??? Heartburn 07/25/2007     Has improved with weight loss   ??? Essential hypertension April 09, 1995     Dr. Mcneil Sober, nephro.   ??? 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 Family history of breast cancer in mother 76     MRI, 11 mo.  Dr. Gerre Pebbles   ??? Adrenal adenoma 06/2009     Dr. Mcneil Sober, nephro.  Dr. Boston Service, surgeon.   ??? Chest pain of uncertain etiology 53/06/6438     Dr. Sinclair Ship.  Normal Stress Echo 12/26/09.   ??? Type 2 diabetes, diet controlled ([place]) 1999   ??? Osteoarthritis of lumbar spine      MRI 12/14   ??? Osteoarthritis of thoracic spine      MRI 12/14, Stable   ??? Vitamin D deficiency 06/15/2013   ??? Abnormal gall bladder diagnostic imaging 07/2000     Abnormal HIDA.  Dr. Tomasita Morrow.   ??? 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  Dr. Gerilyn Nestle.   ??? Chronic insomnia 2009, 2011     Dr. Dineen Kid.    ??? OSA (obstructive sleep apnea) 11/2010     Mild.  Txd CPAP 8 cm H20.  Dr. Bretta Bang.  stopped 11/2011.        Past Surgical History   Procedure Laterality Date   ??? Pr appendectomy  07/2000     Dr. Tomasita Morrow.   ??? Hx tonsillectomy  1972/04/08   ??? Hx breast reduction  1989     Bilaterally.  Dr. Ashby Dawes.    ??? Hx cholecystectomy  07/2000     Gallbladder malfunctioning.  Dr. Tomasita Morrow.   ??? Pr abdomen surgery proc unlisted Right 08/01/09     LAP ADRENALECTOMY.  due to benign tumor.  Dr. [place] Service.   ??? Hx other surgical  2008, 2013     LIVER BIOPSY X 2.           Family History   Problem Relation Age of Onset   ??? Hypertension Mother    ??? Breast Cancer Mother 8   ??? Stroke Mother 57   ??? Diabetes Father    ??? Hypertension Father    ??? Stroke Sister 21     Carotid Artery Dissection   ???  Anxiety Mother    ??? Depression Mother    ??? Other Sister      psedotumor cerebri   ??? Kidney Disease Father      reversed   ??? High Cholesterol Father    ??? Diabetes Paternal Grandmother    ??? Hypertension Paternal Grandmother    ??? Hypertension Paternal Grandfather    ??? High Cholesterol Paternal Grandfather    ??? Diabetes Maternal Grandmother    ??? Hypertension Maternal Grandmother    ??? Diabetes Maternal Grandfather    ??? Hypertension Maternal Grandfather    ??? Cancer Paternal Grandmother      ?LEUKEMIA   ??? Cancer Paternal Uncle      PANCREATIC   ??? Cancer Maternal Uncle      PANCREATIC        History     Social History   ??? Marital Status: SINGLE     Spouse Name: Single     Number of Children: 0   ??? Years of Education: N/A     Occupational History   ??? [employer]     Social History Main Topics   ??? Smoking status: Former Smoker -- 0.30 packs/day for 10 years     Types: Cigarettes     Quit date: 02/08/2009   ??? Smokeless tobacco: Never Used   ??? Alcohol Use: 0.5 oz/week     1 Glasses of wine per week   ??? Drug Use: No   ??? Sexual Activity: Yes     Other Topics Concern   ??? Not on file     Social History Narrative                 ALLERGIES: Aldactone; Codeine; and Paxil    Review of Systems   Constitutional: Negative for chills.   HENT: Positive for rhinorrhea and sore throat.    Respiratory: Positive for cough. Negative for shortness of breath and wheezing.    Cardiovascular: Negative for chest pain.   Gastrointestinal: Negative for nausea and vomiting.   Neurological: Positive for headaches.       Filed Vitals:    02/08/14 1606   BP: 171/90   Pulse: 92   Temp: 98.4 ??F (36.9 ??C)   Resp: 18   Height: 5\' 5"  (1.651 m)   Weight: 90.357 kg (199 lb 3.2 oz)   SpO2: 97%       Physical Exam   Constitutional: No distress.   HENT:   Right Ear: Tympanic membrane and ear canal normal.   Left Ear: Tympanic membrane and ear canal normal.   Nose: Nose normal.   Mouth/Throat: No oropharyngeal exudate, posterior oropharyngeal edema or posterior oropharyngeal erythema.   Eyes: Conjunctivae are normal. Right eye exhibits no discharge. Left eye exhibits no discharge.   Neck: Neck supple.   Pulmonary/Chest: Effort normal and breath sounds normal. No respiratory distress. She has no wheezes. She has no rales.   Lymphadenopathy:     She has no cervical adenopathy.   Skin: No rash noted.   Nursing note and vitals reviewed.      MDM     Differential Diagnosis; Clinical Impression; Plan:     CLINICAL IMPRESSION:  No diagnosis found.    Plan:  1. Amoxicillin/ tesselon and flonase  2. Fluids/ gargles and saline sinus rinse  3. Use OTC tylenol cold- sinus and Claritin as needed          Procedures

## 2014-02-26 ENCOUNTER — Ambulatory Visit
Admit: 2014-02-26 | Discharge: 2014-02-26 | Payer: PRIVATE HEALTH INSURANCE | Attending: Family Medicine | Primary: Family Medicine

## 2014-02-26 DIAGNOSIS — I10 Essential (primary) hypertension: Secondary | ICD-10-CM

## 2014-02-26 MED ORDER — SERTRALINE 100 MG TAB
100 mg | ORAL_TABLET | Freq: Every day | ORAL | Status: DC
Start: 2014-02-26 — End: 2015-03-11

## 2014-02-26 NOTE — Patient Instructions (Addendum)
Diarrhea: Care Instructions  Your Care Instructions     Diarrhea is loose, watery stools (bowel movements). The exact cause is often hard to find. Sometimes diarrhea is your body's way of getting rid of what caused an upset stomach. Viruses, food poisoning, and many medicines can cause diarrhea. Some people get diarrhea in response to emotional stress, anxiety, or certain foods.  Almost everyone has diarrhea now and then. It usually isn't serious, and your stools will return to normal soon. The important thing to do is replace the fluids you have lost, so you can prevent dehydration.  The doctor has checked you carefully, but problems can develop later. If you notice any problems or new symptoms, get medical treatment right away.  Follow-up care is a key part of your treatment and safety. Be sure to make and go to all appointments, and call your doctor if you are having problems. It's also a good idea to know your test results and keep a list of the medicines you take.  How can you care for yourself at home?  ?? Watch for signs of dehydration, which means your body has lost too much water. Dehydration is a serious condition and should be treated right away. Signs of dehydration are:  ?? Increasing thirst and dry eyes and mouth.  ?? Feeling faint or lightheaded.  ?? Darker urine, and a smaller amount of urine than normal.  ?? To prevent dehydration, drink plenty of fluids, enough so that your urine is light yellow or clear like water. Choose water and other caffeine-free clear liquids until you feel better. If you have kidney, heart, or liver disease and have to limit fluids, talk with your doctor before you increase the amount of fluids you drink.  ?? Begin eating small amounts of mild foods the next day, if you feel like it.  ?? Try yogurt that has live cultures of Lactobacillus. (Check the label.)  ?? Avoid spicy foods, fruits, alcohol, and caffeine until 48 hours after all symptoms are gone.   ?? Avoid chewing gum that contains sorbitol.  ?? Avoid dairy products (except for yogurt with Lactobacillus) while you have diarrhea and for 3 days after symptoms are gone.  ?? The doctor may recommend that you take over-the-counter medicine, such as loperamide (Imodium), if you still have diarrhea after 6 hours. Read and follow all instructions on the label. Do not use this medicine if you have bloody diarrhea, a high fever, or other signs of serious illness. Call your doctor if you think you are having a problem with your medicine.  When should you call for help?  Call 911 anytime you think you may need emergency care. For example, call if:  ?? You passed out (lost consciousness).  ?? Your stools are maroon or very bloody.  Call your doctor now or seek immediate medical care if:  ?? You are dizzy or lightheaded, or you feel like you may faint.  ?? Your stools are black and look like tar, or they have streaks of blood.  ?? You have new or worse belly pain.  ?? You have symptoms of dehydration, such as:  ?? Dry eyes and a dry mouth.  ?? Passing only a little dark urine.  ?? Feeling thirstier than usual.  ?? You have a new or higher fever.  Watch closely for changes in your health, and be sure to contact your doctor if:  ?? Your diarrhea is getting worse.  ?? You see pus in the diarrhea.  ??   You are not getting better after 2 days (48 hours).   Where can you learn more?   Go to [email or web address]  Enter [id number] in the search box to learn more about "Diarrhea: Care Instructions."   ?? 2006-2015 Healthwise, Incorporated. Care instructions adapted under license by [employer] (which disclaims liability or warranty for this information). This care instruction is for use with your licensed healthcare professional. If you have questions about a medical condition or this instruction, always ask your healthcare professional. [place] any warranty or liability for your use of this information.   Content Version: 10.7.482551; Current as of: May 29, 2013              Oral Rehydration: Care Instructions  Your Care Instructions  Dehydration occurs when your body loses too much water. This can happen if you do not drink enough fluids or lose a lot of fluid due to diarrhea, vomiting, or sweating. Being dehydrated can cause health problems and can even be life-threatening.  To replace lost fluids, you need to drink liquid that contains special chemicals called electrolytes. Electrolytes keep your body working well. Plain water does not have electrolytes. You also need to rest to prevent more fluid loss. Replacing water and electrolytes (oral rehydration) completely takes about 36 hours. But you should feel better within a few hours.  Follow-up care is a key part of your treatment and safety. Be sure to make and go to all appointments, and call your doctor if you are having problems. It's also a good idea to know your test results and keep a list of the medicines you take.  How can you care for yourself at home?  ?? Take frequent sips of a drink such as Gatorade, Powerade, or other rehydration drinks that your doctor suggests. These replace both fluid and important chemicals (electrolytes) you need for balance in your blood.  ?? Drink 2 quarts of cool liquid over 2 to 4 hours. You should have at least 10 glasses of liquid a day to replace lost fluid. If you have kidney, heart, or liver disease and have to limit fluids, talk with your doctor before you increase the amount of fluids you drink.  ?? Make your own drink. Measure everything carefully. The drink may not work well or may even be harmful if the amounts are off.  ?? 1 quart water  ?? ?? teaspoon salt  ?? 6 teaspoons sugar  ?? Do not drink liquid with caffeine, such as coffee and colas.  ?? Do not drink any alcohol. It can make you dehydrated.  ?? Drink plenty of fluids, enough so that your urine is light yellow or  clear like water. If you have kidney, heart, or liver disease and have to limit fluids, talk with your doctor before you increase the amount of fluids you drink.  When should you call for help?  Call 911 anytime you think you may need emergency care. For example, call if:  ?? You have signs of severe dehydration, such as:  ?? You are confused or unable to stay awake.  ?? You passed out (lost consciousness).  Call your doctor now or seek immediate medical care if:  ?? You still have signs of dehydration. You have sunken eyes and a dry mouth, and you pass only a little dark urine.  ?? You are dizzy or lightheaded, or you feel like you may faint.  ?? You are not able to keep down fluids.  Watch closely for  changes in your health, and be sure to contact your doctor if:  ?? You do not get better as expected.   Where can you learn more?   Go to [email or web address]  Enter I040 in the search box to learn more about "Oral Rehydration: Care Instructions."   ?? 2006-2015 Healthwise, Incorporated. Care instructions adapted under license by [employer] (which disclaims liability or warranty for this information). This care instruction is for use with your licensed healthcare professional. If you have questions about a medical condition or this instruction, always ask your healthcare professional. [place] any warranty or liability for your use of this information.  Content Version: 10.7.482551; Current as of: November 21, 2012              DASH Diet: Care Instructions  Your Care Instructions  The DASH diet is an eating plan that can help lower your blood pressure. DASH stands for Dietary Approaches to Stop Hypertension. Hypertension is high blood pressure.  The DASH diet focuses on eating foods that are high in calcium, potassium, and magnesium. These nutrients can lower blood pressure. The foods that are highest in these nutrients are fruits, vegetables, low-fat dairy  products, nuts, seeds, and legumes. But taking calcium, potassium, and magnesium supplements instead of eating foods that are high in those nutrients does not have the same effect. The DASH diet also includes whole grains, fish, and poultry.  The DASH diet is one of several lifestyle changes your doctor may recommend to lower your high blood pressure. Your doctor may also want you to decrease the amount of sodium in your diet. Lowering sodium while following the DASH diet can lower blood pressure even further than just the DASH diet alone.  Follow-up care is a key part of your treatment and safety. Be sure to make and go to all appointments, and call your doctor if you are having problems. It's also a good idea to know your test results and keep a list of the medicines you take.  How can you care for yourself at home?  Following the DASH diet  ?? Eat 4 to 5 servings of fruit each day. A serving is 1 medium-sized piece of fruit, ?? cup chopped or canned fruit, 1/4 cup dried fruit, or 4 ounces (?? cup) of fruit juice. Choose fruit more often than fruit juice.  ?? Eat 4 to 5 servings of vegetables each day. A serving is 1 cup of lettuce or raw leafy vegetables, ?? cup of chopped or cooked vegetables, or 4 ounces (?? cup) of vegetable juice. Choose vegetables more often than vegetable juice.  ?? Get 2 to 3 servings of low-fat and fat-free dairy each day. A serving is 8 ounces of milk, 1 cup of yogurt, or 1 ?? ounces of cheese.  ?? Eat 6 to 8 servings of grains each day. A serving is 1 slice of bread, 1 ounce of dry cereal, or ?? cup of cooked rice, pasta, or cooked cereal. Try to choose whole-grain products as much as possible.  ?? Limit lean meat, poultry, and fish to 2 servings each day. A serving is 3 ounces, about the size of a deck of cards.  ?? Eat 4 to 5 servings of nuts, seeds, and legumes (cooked dried beans, lentils, and split peas) each week. A serving is 1/3 cup of nuts, 2  tablespoons of seeds, or ?? cup of cooked beans or peas.  ?? Limit fats and oils to 2 to 3 servings each  day. A serving is 1 teaspoon of vegetable oil or 2 tablespoons of salad dressing.  ?? Limit sweets and added sugars to 5 servings or less a week. A serving is 1 tablespoon jelly or jam, ?? cup sorbet, or 1 cup of lemonade.  ?? Eat less than 2,300 milligrams (mg) of sodium a day. If you have high blood pressure, diabetes, or chronic kidney disease, if you are African-American, or if you are older than age 84, try to limit the amount of sodium you eat to less than 1,500 mg a day.  Tips for success  ?? Start small. Do not try to make dramatic changes to your diet all at once. You might feel that you are missing out on your favorite foods and then be more likely to not follow the plan. Make small changes, and stick with them. Once those changes become habit, add a few more changes.  ?? Try some of the following:  ?? Make it a goal to eat a fruit or vegetable at every meal and at snacks. This will make it easy to get the recommended amount of fruits and vegetables each day.  ?? Try yogurt topped with fruit and nuts for a snack or healthy dessert.  ?? Add lettuce, tomato, cucumber, and onion to sandwiches.  ?? Combine a ready-made pizza crust with low-fat mozzarella cheese and lots of vegetable toppings. Try using tomatoes, squash, spinach, broccoli, carrots, cauliflower, and onions.  ?? Have a variety of cut-up vegetables with a low-fat dip as an appetizer instead of chips and dip.  ?? Sprinkle sunflower seeds or chopped almonds over salads. Or try adding chopped walnuts or almonds to cooked vegetables.  ?? Try some vegetarian meals using beans and peas. Add garbanzo or kidney beans to salads. Make burritos and tacos with mashed pinto beans or black beans.   Where can you learn more?   Go to [employer].cy  Enter H967 in the search box to learn more about "DASH Diet: Care Instructions."    ?? 2006-2015 Healthwise, Incorporated. Care instructions adapted under license by [employer] (which disclaims liability or warranty for this information). This care instruction is for use with your licensed healthcare professional. If you have questions about a medical condition or this instruction, always ask your healthcare professional. [place] any warranty or liability for your use of this information.  Content Version: 10.7.482551; Current as of: February 27, 2013              How to Read a Food Label to Limit Sodium: Care Instructions  Your Care Instructions  Sodium causes your body to hold on to extra water. This can raise your blood pressure and force your heart and kidneys to work harder. In very serious cases, this could cause you to be put in the hospital. It might even be life-threatening. By limiting sodium, you will feel better and lower your risk of serious problems.  Processed foods, fast food, and restaurant foods are the major sources of dietary sodium. The most common name for sodium is salt. Try to limit how much sodium you eat to less than 2,300 milligrams (mg) a day. And try to limit your sodium to less than 1,500 mg a day if you are 51 or older, are black, or have high blood pressure, diabetes, or chronic kidney disease. This limit counts all the salt you eat in foods you cook or in packaged foods. Keep a list of everything you eat and drink.  Follow-up  care is a key part of your treatment and safety. Be sure to make and go to all appointments, and call your doctor if you are having problems. It???s also a good idea to know your test results and keep a list of the medicines you take.  How can you care for yourself at home?  Read ingredient lists on food labels  ?? Read the list of ingredients on food labels to help you find how much sodium is in a food. The label lists the ingredients in a food in descending order (from the most to the least). If salt or sodium is high  on the list, there may be a lot of sodium in the food.  ?? Know that sodium has different names. Sodium is also called monosodium glutamate (MSG, common in [place] food), sodium citrate, sodium alginate, sodium hydroxide, and sodium phosphate.  Read Nutrition Facts labels  ?? On most foods, there is a Nutrition Facts label. This will tell you how much sodium is in one serving of food. Look at both the serving size and the sodium amount. The serving size is located at the top of the label, usually right under the "Nutrition Facts" title. The amount of sodium is given in the list under the title. It is given in milligrams (mg).  ?? Check the serving size carefully. A single serving is often very small, and you may eat more than one serving. If this is the case, you will eat more sodium than listed on the label. For example, if the serving size for a canned soup is 1 cup and the sodium amount is 470 mg, if you have 2 cups you will eat 940 mg of sodium.  ?? The nutrition facts for fresh fruits and vegetables are not listed on the food. They may be listed somewhere in the store. These foods usually have no sodium or low sodium.  ?? The Nutrition Facts label also gives you the Percent Daily Value for sodium. This is how much of the recommended amount of sodium a serving contains. The daily value for sodium is less than 2,300 mg. So if the Percent Daily Value says 50%, this means one serving is giving you half of this, or 1,150 mg.  Buy low-sodium foods  ?? Look for foods that are made with less sodium. Watch for the following words on the label.  ?? "Unsalted" means there is no sodium added to the food. But there may be sodium already in the food naturally.  ?? "Sodium-free" means a serving has less than 5 mg of sodium.  ?? "Very low sodium" means a serving has 35 mg or less of sodium.  ?? "Low-sodium" means a serving has 140 mg or less of sodium.  ?? "Reduced-sodium" means that there is 25% less sodium than what the food  normally has. This is still usually too much sodium. Try not to buy foods with this on the label.  ?? Buy fresh vegetables, or frozen vegetables without added sauces. Buy low-sodium versions of canned vegetables, soups, and other canned goods.   Where can you learn more?   Go to [email or web address]  Enter B757 in the search box to learn more about "How to Read a Food Label to Limit Sodium: Care Instructions."   ?? 2006-2015 Healthwise, Incorporated. Care instructions adapted under license by [employer] (which disclaims liability or warranty for this information). This care instruction is for use with your licensed healthcare professional. If you have questions about a  medical condition or this instruction, always ask your healthcare professional. [place] any warranty or liability for your use of this information.  Content Version: 10.7.482551; Current as of: February 20, 2013              Saline Nasal Washes: Care Instructions  Your Care Instructions  Saline nasal washes help keep the nasal passages open by washing out thick or dried mucus. This simple remedy can help relieve symptoms of allergies, sinusitis, and colds. It also can make the nose feel more comfortable by keeping the mucous membranes moist. You may notice a little burning sensation in your nose the first few times you use the solution, but this usually gets better in a few days.  Follow-up care is a key part of your treatment and safety. Be sure to make and go to all appointments, and call your doctor if you are having problems. It???s also a good idea to know your test results and keep a list of the medicines you take.  How can you care for yourself at home?  ?? You can buy premixed saline solution in a squeeze bottle or other sinus rinse products at a drugstore. Read and follow the instructions on the label.  ?? You also can make your own saline solution by adding 1 teaspoon of salt  and 1 teaspoon of baking soda to 2 cups of distilled water.  ?? If you use a homemade solution, pour a small amount into a clean bowl. Using a rubber bulb syringe, squeeze the syringe and place the tip in the salt water. Pull a small amount of the salt water into the syringe by relaxing your hand.  ?? Sit down with your head tilted slightly back. Do not lie down. Put the tip of the bulb syringe or the squeeze bottle a little way into one of your nostrils. Gently squirt a small amount (about 1 teaspoon) into the nostril. Repeat with the other nostril. Some sneezing and gagging are normal at first.  ?? Gently blow your nose.  ?? Wipe the syringe or bottle tip clean after each use.  ?? Repeat this 2 or 3 times a day.  ?? Use nasal washes gently if you have nosebleeds often.  When should you call for help?  Watch closely for changes in your health, and be sure to contact your doctor if:  ?? You often get nosebleeds.  ?? You have problems doing the nasal washes.   Where can you learn more?   Go to [email or web address]  Enter B784 in the search box to learn more about "Saline Nasal Washes: Care Instructions."   ?? 2006-2015 Healthwise, Incorporated. Care instructions adapted under license by [employer] (which disclaims liability or warranty for this information). This care instruction is for use with your licensed healthcare professional. If you have questions about a medical condition or this instruction, always ask your healthcare professional. [place] any warranty or liability for your use of this information.  Content Version: 10.7.482551; Current as of: November 21, 2012      Check BP twice weekly.  Notify me if it consistently is above 140/90 or below 90/60.  Bring your blood pressure log to your next office visit.    Decrease salt, fats, caffeine, alcohol in your diet.  Increase water, fiber.  Exercise 5 times weekly for 30 minutes daily as tolerated.   Continue current medications, care and subspecialty follow up.  Visit eye doctor yearly to check your eyes blood  pressure related changes.                    Sleep Apnea: Care Instructions  Your Care Instructions  Sleep apnea means that you frequently stop breathing for 10 seconds or longer during sleep. It can be mild to severe, based on the number of times an hour that you stop breathing or have slowed breathing.  Blocked or narrowed airways in your nose, mouth, or throat can cause sleep apnea. Your airway can become blocked when your throat muscles and tongue relax during sleep.  You can treat sleep apnea at home by making lifestyle changes. You also can use a CPAP breathing machine that keeps tissues in the throat from blocking your airway. Or your doctor may suggest that you use a breathing device while you sleep. It helps keep your airway open. This could be a device that you put in your mouth. Other examples include strips or disks that you use on your nose. In some cases, surgery may be needed to remove enlarged tissues in the throat.  Follow-up care is a key part of your treatment and safety. Be sure to make and go to all appointments, and call your doctor if you are having problems. It's also a good idea to know your test results and keep a list of the medicines you take.  How can you care for yourself at home?  ?? Lose weight, if needed. It may reduce the number of times you stop breathing or have slowed breathing.  ?? Sleep on your side. It may stop mild apnea. If you tend to roll onto your back, sew a pocket in the back of your pajama top. Put a tennis ball into the pocket, and stitch the pocket shut. This will help keep you from sleeping on your back.  ?? Avoid alcohol and medicines such as sleeping pills and sedatives before bed.  ?? Do not smoke. Smoking can make sleep apnea worse. If you need help quitting, talk to your doctor about stop-smoking programs and medicines.  These can increase your chances of quitting for good.  ?? Prop up the head of your bed 4 to 6 inches by putting bricks under the legs of the bed.  ?? Treat breathing problems, such as a stuffy nose, caused by a cold or allergies.  ?? Try a continuous positive airway pressure (CPAP) breathing machine if your doctor recommends it. The machine keeps your airway open when you sleep.  ?? If CPAP does not work for you, ask your doctor if you can try other breathing machines. A bilevel positive airway pressure machine uses one type of air pressure for breathing in and another type for breathing out. Another device raises or lowers air pressure as needed while you breathe.  ?? Talk to your doctor if:  ?? Your nose feels dry or bleeds when you use one of these machines. You may need to increase moisture in the air. A humidifier may help.  ?? Your nose is runny or stuffy from using a breathing machine. Decongestants or a corticosteroid nasal spray may help.  ?? You are sleepy during the day and it gets in the way of the normal things you do. Do not drive when you are drowsy.  When should you call for help?  Watch closely for changes in your health, and be sure to contact your doctor if:  ?? You still have sleep apnea even though you have made lifestyle changes.  ?? You  are thinking of trying a device such as CPAP.  ?? You are having problems using a CPAP or similar machine.   Where can you learn more?   Go to [employer].[email or web address]  Enter J936 in the search box to learn more about "Sleep Apnea: Care Instructions."   ?? 2006-2015 Healthwise, Incorporated. Care instructions adapted under license by [employer] (which disclaims liability or warranty for this information). This care instruction is for use with your licensed healthcare professional. If you have questions about a medical condition or this instruction, always ask your healthcare professional. [place] any warranty or liability for your use of this information.  Content Version: 10.7.482551; Current as of: August 28, 2013

## 2014-02-26 NOTE — Progress Notes (Signed)
Patient presents to office for follow up on Sleep Problem.  States having Diarrhea after each meal x 1 week.  States feels her Zoloft needs to be increased due to a over whelming feeling and lack of motivation she is experiencing.  Review HDL labs from 12/17/13 ordered at [facility]    ED Follow Up:  02/08/14 - [facility] - Sore Throat/Congestion/cough x 7 days    Referral follow up:  12/17/13 - Dr Benjaman Lobe - Hepatology   12/30/13 - Dr Eli Phillips - Eye Exam     1. Have you been to the ER, urgent care clinic since your last visit?  Hospitalized since your last visit? Yes - see above documentation     2. Have you seen or consulted any other health care providers outside of the [facility] since your last visit?  Include any pap smears or colon screening.  Yes - see above documentation

## 2014-02-26 NOTE — Progress Notes (Signed)
HISTORY OF PRESENT ILLNESS  Katrina [name] is a 49 y.o. female presents with Sleep Problem; Referral Follow Up; ED Follow-up; Diarrhea; Depression; and Results    Agree with nurse note.    Hypertensive, diabetic pt with dyslipidemia, hypothyroidism, and Vitamin D deficiency presents to the office with a BP of 159/97.  She ate jerk chicken yesterday for lunch.  Outside of the office her BP averages 131/75.  She has taken BP medication in the past.  Her weight is 197 lbs, gained 6 lbs since last ov.  She is frustrated with her weight.  She has stopped running and feels that she has "lost her fire."  She has been to the gym 2x in the last 2 months.  She has been very busy with work and school part time and is eating quick meals on the go.  She is taking Synthroid 88 mcg daily, and Metformin ER 1000 mg BID, tolerating well.      She is followed by ophthalmologist, Dr. Eli Phillips, whom she last saw on 12/30/13.  There is a note in her chart from him, but it is blank.      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 Benjaman Lobe.  She last saw him on 12/17/13.  F/U 6 months.      She requests her [facility] ([facility]) labs from 12/17/13 that she has drawn per Dr. Ashley Royalty for the study.  He draws labs once a year.  Her LDL was 154, increased from 86.  She was running when her LDL was 86.  Her Hgb A1C was 5.9, increased from 5.6.  Her Vitamin D was low and she is taking Vitamin D 2000IU daily, tolerating well.  She is also taking a Vitamin B complex daily, tolerating well.      Pt complains of diarrhea after every meal x7 days.  She stool is very watery or loose.  She has had 4 BMs this morning.  She usually has 2 formed BM/day.  Denies abd pain, nausea, vomiting, F/C or changes in her diet.  She is s/p cholecystectomy.  She drank well water at her brother's house this weekend, but her sxs had started prior to that.       Pt with recurrent major depression complains that she has had no motivation to do anything.  She had 2 papers due in 4 days, but has not started them.  She wants to be able to avoid her responsibility for a weekend and take a break.  She is working with therapist, Clydell Hakim.  Dr. Simone Curia started her on Zoloft 75 mg daily in 05/2013.  She wonders if her Zoloft should be increased.  Denies SI/HI.      [name] and chronic insomnia complains that she is tired when she wakes up and fatigued throughout the day.  According to her FitBit she is averaging 4-4,5 hours of sleep/night.  She does not have difficulty going to sleep and she does not recall waking up, but her FitBit reports that she wakes up about 2x/week and is often restless.  She is not using her CPAP machine and had to cancel her last appointment with her sleep specialist, Dr. Dineen Kid.  She would like to use her CPAP for several months and then see Dr. Bretta Bang.  She has taken Ambien in the past ant tolerated it well.  She occasionally takes Ativan  0.5 mg at night when she is anxious, which helps to calm her mind.       She went to UC on 02/08/14 for dry cough and congestion x7 days.  Dx'd acute bronchitis and salpingitis of R eustachian tube.  Rx'd Amoxicillin 875 mg x10 days, Tessalon 200 mg, Flonase nasal spray.  She completed 5 of the 10 days of Amoxicillin and is feeling better.      Pt is followed by GYN, Dr. Gerre Pebbles, who performs her pap smears and mammograms.  She had a mammogram and MRI last year.      Written by Coralie Carpen, scribe, as dictated by Dr. Felix Ahmadi, DO.        ROS    Review of Systems negative except as noted above in HPI.    ALLERGIES:    Allergies   Allergen Reactions   ??? Aldactone [Spironolactone] Swelling   ??? Codeine Rash and Itching   ??? Paxil [Paroxetine Hcl] Other (comments)     WORSENED DEPRESSION.       CURRENT MEDICATIONS:    Outpatient Prescriptions Marked as Taking for the 02/26/14 encounter  (Office Visit) with Talbert Cage, DO   Medication Sig Dispense Refill   ??? sertraline (ZOLOFT) 100 mg tablet Take 1 Tab by mouth daily. Indications: MAJOR DEPRESSIVE DISORDER 90 Tab 3   ??? fluticasone (FLONASE) 50 mcg/actuation nasal spray 2 Sprays by Both Nostrils route daily. 1 Bottle 0   ??? levothyroxine (SYNTHROID) 88 mcg tablet Take 1 Tab by mouth Daily (before breakfast). Indications: HYPOTHYROIDISM 90 Tab 2   ??? cholecalciferol, vitamin D3, (VITAMIN D3) 2,000 unit tab Take  by mouth.     ??? LORazepam (ATIVAN) 0.5 mg tablet Take 1 Tab by mouth every eight (8) hours as needed for Anxiety. Max Daily Amount: 1.5 mg. Indications: ANXIETY 90 Tab 0   ??? metFORMIN ER (GLUCOPHAGE XR) 500 mg tablet Take 2 Tabs by mouth Before breakfast and dinner. Indications: TYPE 2 DIABETES MELLITUS 360 Tab 3   ??? b complex vitamins tablet Take 1 Tab by mouth daily.       ??? ascorbic acid (VITAMIN C) 500 mg tablet Take  by mouth.     ??? vitamin E (AQUA GEMS) 400 unit capsule Take 400 Units by mouth two (2) times a day.         PAST MEDICAL HISTORY:    Past Medical History   Diagnosis Date   ??? Obesity 07/25/2007     Improved with Lifestyle Modifications   ??? Dyslipidemia 2007-04-15     Improving with weight loss   ??? Fatty liver 07/25/2007     with elevated AST, ALT.  In NASH study at [facility] (Dr. Hilary Hertz)   ??? Major depression, recurrent ([place]) 1992, 1997, 15-Apr-2007, 05/2013     situational.  fiance's death April 15, 1995). Clydell Hakim, counselor.    ??? Heartburn 07/25/2007     Has improved with weight loss   ??? Essential hypertension 04/15/95     Dr. Mcneil Sober, nephro.  Dr. Eli Phillips, ophth.   ??? 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 Family history of breast cancer in mother 45     MRI, 96 mo.  Dr. Gerre Pebbles   ??? Adrenal adenoma 06/2009     Dr. Mcneil Sober, nephro.  Dr. Boston Service, surgeon.   ??? Chest pain of uncertain etiology 42/05/9561     Dr. Sinclair Ship.  Normal Stress Echo 12/26/09.    ???  Type 2 diabetes, diet controlled ([place]) 2005   ??? Osteoarthritis of lumbar spine      MRI 12/14   ??? Osteoarthritis of thoracic spine      MRI 12/14, Stable   ??? Vitamin D deficiency 05/2010   ??? Abnormal gall bladder diagnostic imaging 07/2000     Abnormal HIDA.  Dr. Tomasita Morrow.   ??? 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  Dr. Gerilyn Nestle.   ??? Chronic insomnia 2009, 2011     Dr. Dineen Kid.   ??? OSA (obstructive sleep apnea) 11/2010     Mild.  Txd CPAP 8 cm H20.  Dr. Bretta Bang.  stopped 11/2011.       PAST SURGICAL HISTORY:    Past Surgical History   Procedure Laterality Date   ??? Pr appendectomy  07/2000     Dr. Tomasita Morrow.   ??? Hx tonsillectomy  1974   ??? Hx breast reduction  1989     Bilaterally.  Dr. Ashby Dawes.    ??? Hx cholecystectomy  07/2000     Gallbladder malfunctioning.  Dr. Tomasita Morrow.   ??? Hx other surgical Right 08/01/09     LAP ADRENALECTOMY.  due to benign adrenal adenoma/tumor.  Dr. Boston Service.   ??? Hx other surgical  2008, 2013     LIVER BIOPSY X 2.         FAMILY HISTORY:    Family History   Problem Relation Age of Onset   ??? Hypertension Mother    ??? Breast Cancer Mother 16   ??? Stroke Mother 5   ??? Diabetes Father    ??? Hypertension Father    ??? Stroke Sister 21     Carotid Artery Dissection   ??? Anxiety Mother    ??? Depression Mother    ??? Other Sister      psedotumor cerebri   ??? Kidney Disease Father      reversed   ??? High Cholesterol Father    ??? Diabetes Paternal Grandmother    ??? Hypertension Paternal Grandmother    ??? Hypertension Paternal Grandfather    ??? High Cholesterol Paternal Grandfather    ??? Diabetes Maternal Grandmother    ??? Hypertension Maternal Grandmother    ??? Diabetes Maternal Grandfather    ??? Hypertension Maternal Grandfather    ??? Cancer Paternal Grandmother      ?LEUKEMIA   ??? Cancer Paternal Uncle      PANCREATIC   ??? Cancer Maternal Uncle      PANCREATIC       SOCIAL HISTORY:    History     Social History   ??? Marital Status: SINGLE     Spouse Name: Single   ??? Number of Children: 0    ??? Years of Education: N/A     Occupational History   ??? [employer]     Social History Main Topics   ??? Smoking status: Former Smoker -- 0.30 packs/day for 10 years     Types: Cigarettes     Quit date: 02/08/2009   ??? Smokeless tobacco: Never Used   ??? Alcohol Use: 0.5 oz/week     1 Glasses of wine per week   ??? Drug Use: No   ??? Sexual Activity: Yes     Other Topics Concern   ??? None     Social History Narrative       IMMUNIZATIONS:    Immunization History   Administered Date(s) Administered   ???  Hep B Vaccine 06/08/2000   ??? Influenza Vaccine 10/14/2013   ??? Influenza Vaccine Split 10/24/2010   ??? Pneumococcal Polysaccharide (PPV-23) 12/22/2010   ??? Pneumococcal Vaccine 12/05/2005   ??? TD Vaccine 10/23/2000   ??? TDAP Vaccine 10/24/2010         PHYSICAL EXAMINATION    Vital Signs  BP 159/97 mmHg   Pulse 70   Temp(Src) 98.2 ??F (36.8 ??C) (Oral)   Resp 18   Ht 5\' 5"  (1.651 m)   Wt 197 lb 12.8 oz (89.721 kg)   BMI 32.92 kg/m2   SpO2 97%   LMP 01/12/2014    Weight Metrics 02/26/2014 02/08/2014 11/09/2013 08/04/2013 07/31/2013 07/27/2013 07/09/2013   Weight 197 lb 12.8 oz 199 lb 3.2 oz 191 lb 190 lb 190 lb 12.8 oz 188 lb 12.8 oz 185 lb 6.4 oz   BMI 32.92 kg/m2 33.15 kg/m2 31.78 kg/m2 31.62 kg/m2 31.75 kg/m2 31.42 kg/m2 30.85 kg/m2       General appearance - Well nourished. Well appearing.  Well developed.  No acute distress. Overweight.   Head - Normocephalic.  Atraumatic.    Eyes - pupils equal and reactive. Extraocular eye movements intact. Sclera anicteric.  Mildly injected sclera.  Ears - Hearing is grossly normal bilaterally.      Nose - normal and patent.  No polyps noted.  No erythema.  No discharge.    Mouth - mucous membranes with inadequate moisture.  Posterior pharynx normal with cobblestone appearance.  No erythema, white exudate or obstruction.  Neck - supple.  Midline trachea.  No carotid bruits noted bilaterally.  No thyromegaly noted.  Chest - clear to auscultation bilaterally anteriorly and posteriorly.  No  wheezes.  No rales or rhonchi.  Breath sounds are symmetrical bilaterally.  Unlabored respirations.  Heart - normal rate.  Regular rhythm.  Normal S1, S2.  No murmur noted.  No rubs, clicks or gallops noted.  Abdomen - soft and distended.  No masses or organomegaly.  No rebound, rigidity or guarding.  Bowel sounds normal x 4 quadrants.  No tenderness noted.  Neurological - awake, alert and oriented to person, place, and time and event.  Cranial nerves II through XII intact.  Clear speech.  Muscle strength is +5/5 x 4 extremities.  Sensation is intact to light touch bilaterally.  Steady gait.   Heme/Lymph - peripheral pulses normal x 4 extremities.  No peripheral edema is noted.    Musculoskeletal - Intact x 4 extremities.  Full ROM x 4 extremities.  No pain with movement.    Back exam - normal range of motion.  No pain on palpation of the spinous processes in the cervical, thoracic, lumbar, sacral regions.  No CVA tenderness.    Skin - no rashes, erythema, ecchymosis, lacerations, abrasions, suspicious moles noted  Psychological -   normal behavior, dress and thought processes.  Good insight. Good eye contact.  Normal affect.  Appropriate mood.  Normal speech.    DATA REVIEWED    See scanned document.    Results for orders placed or performed in visit on 07/31/13   TSH, 3RD GENERATION   Result Value Ref Range    TSH 1.210 0.450 - 4.500 uIU/mL   T4, FREE   Result Value Ref Range    T4, Free 1.34 0.82 - 1.77 ng/dL   VITAMIN B12 & FOLATE   Result Value Ref Range    Vitamin B12 452 211 - 946 pg/mL    Folate 10.7 >3.0 ng/mL  METHYLMALONIC ACID   Result Value Ref Range    METHYLMALONIC ACID, SERUM 168 0 - 378 nmol/L       ASSESSMENT and PLAN      ICD-10-CM ICD-9-CM    1. Essential hypertension I10 401.9     uncontrolled due to OSA vs inadequate sleep vs other   2. Dyslipidemia E78.5 272.4     with elevated LDLP and sdLDL, worse due to decreased exercise   3. Type 2 diabetes, diet controlled (HCC) E11.9 250.00     4. Recurrent major depressive disorder, in remission (HCC) F33.40 296.35 sertraline (ZOLOFT) 100 mg tablet    improving with counseling and Zoloft   5. Chronic insomnia F51.04 780.52     due to restlessness vs other, avg 4-4.5 hours   6. Hypothyroidism due to acquired atrophy of thyroid E03.8 244.8     E03.4 246.8     stable   7. OSA (obstructive sleep apnea) G47.33 327.23     without CPAP since 2013   8. Fatty liver K76.0 571.8     due to NASH   9. Vitamin D deficiency E55.9 268.9     stable   10. Obesity, Class I, BMI 30-34.9 E66.9 278.00    11. Acute diarrhea R19.7 787.91     due to Viral vs Metformin vs other   12. S/P cholecystectomy Z90.49 V45.79    13. Acute bronchitis due to other specified organisms J20.8 466.0     improving   14. ETD (eustachian tube dysfunction), bilateral H69.83 381.81    15. Chronic fatigue R53.82 780.79     due to stress vs insomnia vs OSA vs other       Discussed the patient's BMI with her.  The BMI follow up plan is as follows: BMI is out of normal parameters and plan is as follows: I have counseled this patient on diet and exercise regimens.  Addressed weight, diet and exercise with patient.  Eat bland foods, increase fiber and decrease caffine while experiencing diarrhea.  decrease carbohydrates (white foods, sweet foods, sweet drinks and alcohol), increase green leafy vegetables and protein (lean meats and beans) with each meal.  Avoid fried foods. Eat 3-5 small meals daily.  Do not skip meals.  Increase water intake.    Increase physical activity to 30 minutes daily for health benefit or 60 minutes daily to prevent weight regain, as tolerated.  Get 7-8 hours uninterrupted sleep nightly.    Chart reviewed and updated.    Advised pt to sign release to get ov notes from  Dr. Leighton Roach and Dr Curt Bears and mammogram report.    Continue current medications and care.  Finish Amoxicillin 875 mg.  Use Flonase daily x2 weeks.  Increase Vitamin D to 4000IU daily.  Take  Kaopectate or Pepto Bismol prn if diarrhea is interferring with her day.  Increase Zoloft from 75 mg daily to 100 mg daily.    Prescriptions written and sent to pharmacy; medication side effects discussed  Most recent [facility] tests reviewed from 12/17/13.    Recheck pertinent labs in 06/2014.    Recent office visit notes from Dr. Ashley Royalty reviewed  Get recent office visit notes from  Dr. Leighton Roach and Dr Curt Bears and mammogram report.  Counseled patient on health concerns: sleep hygiene, weight, BP, dyslipidemia, diarrhea, ETD, depression, DM  Relevant handouts given and discussed with patient  Immunizations noted; flu vaccine noted.    Offered empathy, support, legitimation, prayers, partnership to patient  Praised patient for  progress  Follow-up Disposition:  Return in about 2 months (around 04/27/2014) for weight, depression, fatigue, sleep.    Patient was offered a choice/choices in the treatment plan today.  Patient expresses understanding of the plan and agrees with recommendations.    More than 55 mins spent face to face with patient and more than 50% of this time spent in counseling and coordinating care.    Written by Coralie Carpen, scribe, as dictated by Dr. Felix Ahmadi, DO.      Documentation True and Accepted by Merryn Thaker R. Lorel Monaco, D.O.      Patient Instructions       Diarrhea: Care Instructions  Your Care Instructions     Diarrhea is loose, watery stools (bowel movements). The exact cause is often hard to find. Sometimes diarrhea is your body's way of getting rid of what caused an upset stomach. Viruses, food poisoning, and many medicines can cause diarrhea. Some people get diarrhea in response to emotional stress, anxiety, or certain foods.  Almost everyone has diarrhea now and then. It usually isn't serious, and your stools will return to normal soon. The important thing to do is replace the fluids you have lost, so you can prevent dehydration.   The doctor has checked you carefully, but problems can develop later. If you notice any problems or new symptoms, get medical treatment right away.  Follow-up care is a key part of your treatment and safety. Be sure to make and go to all appointments, and call your doctor if you are having problems. It's also a good idea to know your test results and keep a list of the medicines you take.  How can you care for yourself at home?  ?? Watch for signs of dehydration, which means your body has lost too much water. Dehydration is a serious condition and should be treated right away. Signs of dehydration are:  ?? Increasing thirst and dry eyes and mouth.  ?? Feeling faint or lightheaded.  ?? Darker urine, and a smaller amount of urine than normal.  ?? To prevent dehydration, drink plenty of fluids, enough so that your urine is light yellow or clear like water. Choose water and other caffeine-free clear liquids until you feel better. If you have kidney, heart, or liver disease and have to limit fluids, talk with your doctor before you increase the amount of fluids you drink.  ?? Begin eating small amounts of mild foods the next day, if you feel like it.  ?? Try yogurt that has live cultures of Lactobacillus. (Check the label.)  ?? Avoid spicy foods, fruits, alcohol, and caffeine until 48 hours after all symptoms are gone.  ?? Avoid chewing gum that contains sorbitol.  ?? Avoid dairy products (except for yogurt with Lactobacillus) while you have diarrhea and for 3 days after symptoms are gone.  ?? The doctor may recommend that you take over-the-counter medicine, such as loperamide (Imodium), if you still have diarrhea after 6 hours. Read and follow all instructions on the label. Do not use this medicine if you have bloody diarrhea, a high fever, or other signs of serious illness. Call your doctor if you think you are having a problem with your medicine.  When should you call for help?   Call 911 anytime you think you may need emergency care. For example, call if:  ?? You passed out (lost consciousness).  ?? Your stools are maroon or very bloody.  Call your doctor now or seek immediate medical care if:  ??  You are dizzy or lightheaded, or you feel like you may faint.  ?? Your stools are black and look like tar, or they have streaks of blood.  ?? You have new or worse belly pain.  ?? You have symptoms of dehydration, such as:  ?? Dry eyes and a dry mouth.  ?? Passing only a little dark urine.  ?? Feeling thirstier than usual.  ?? You have a new or higher fever.  Watch closely for changes in your health, and be sure to contact your doctor if:  ?? Your diarrhea is getting worse.  ?? You see pus in the diarrhea.  ?? You are not getting better after 2 days (48 hours).   Where can you learn more?   Go to [employer].cy  Enter [phone] in the search box to learn more about "Diarrhea: Care Instructions."   ?? 2006-2015 Healthwise, Incorporated. Care instructions adapted under license by [employer] (which disclaims liability or warranty for this information). This care instruction is for use with your licensed healthcare professional. If you have questions about a medical condition or this instruction, always ask your healthcare professional. [place] any warranty or liability for your use of this information.  Content Version: 10.7.482551; Current as of: May 29, 2013              Oral Rehydration: Care Instructions  Your Care Instructions  Dehydration occurs when your body loses too much water. This can happen if you do not drink enough fluids or lose a lot of fluid due to diarrhea, vomiting, or sweating. Being dehydrated can cause health problems and can even be life-threatening.  To replace lost fluids, you need to drink liquid that contains special chemicals called electrolytes. Electrolytes keep your body working well.  Plain water does not have electrolytes. You also need to rest to prevent more fluid loss. Replacing water and electrolytes (oral rehydration) completely takes about 36 hours. But you should feel better within a few hours.  Follow-up care is a key part of your treatment and safety. Be sure to make and go to all appointments, and call your doctor if you are having problems. It's also a good idea to know your test results and keep a list of the medicines you take.  How can you care for yourself at home?  ?? Take frequent sips of a drink such as Gatorade, Powerade, or other rehydration drinks that your doctor suggests. These replace both fluid and important chemicals (electrolytes) you need for balance in your blood.  ?? Drink 2 quarts of cool liquid over 2 to 4 hours. You should have at least 10 glasses of liquid a day to replace lost fluid. If you have kidney, heart, or liver disease and have to limit fluids, talk with your doctor before you increase the amount of fluids you drink.  ?? Make your own drink. Measure everything carefully. The drink may not work well or may even be harmful if the amounts are off.  ?? 1 quart water  ?? ?? teaspoon salt  ?? 6 teaspoons sugar  ?? Do not drink liquid with caffeine, such as coffee and colas.  ?? Do not drink any alcohol. It can make you dehydrated.  ?? Drink plenty of fluids, enough so that your urine is light yellow or clear like water. If you have kidney, heart, or liver disease and have to limit fluids, talk with your doctor before you increase the amount of fluids you drink.  When should  you call for help?  Call 911 anytime you think you may need emergency care. For example, call if:  ?? You have signs of severe dehydration, such as:  ?? You are confused or unable to stay awake.  ?? You passed out (lost consciousness).  Call your doctor now or seek immediate medical care if:  ?? You still have signs of dehydration. You have sunken eyes and a dry  mouth, and you pass only a little dark urine.  ?? You are dizzy or lightheaded, or you feel like you may faint.  ?? You are not able to keep down fluids.  Watch closely for changes in your health, and be sure to contact your doctor if:  ?? You do not get better as expected.   Where can you learn more?   Go to [employer].cy  Enter I040 in the search box to learn more about "Oral Rehydration: Care Instructions."   ?? 2006-2015 Healthwise, Incorporated. Care instructions adapted under license by [employer] (which disclaims liability or warranty for this information). This care instruction is for use with your licensed healthcare professional. If you have questions about a medical condition or this instruction, always ask your healthcare professional. [place] any warranty or liability for your use of this information.  Content Version: 10.7.482551; Current as of: November 21, 2012              DASH Diet: Care Instructions  Your Care Instructions  The DASH diet is an eating plan that can help lower your blood pressure. DASH stands for Dietary Approaches to Stop Hypertension. Hypertension is high blood pressure.  The DASH diet focuses on eating foods that are high in calcium, potassium, and magnesium. These nutrients can lower blood pressure. The foods that are highest in these nutrients are fruits, vegetables, low-fat dairy products, nuts, seeds, and legumes. But taking calcium, potassium, and magnesium supplements instead of eating foods that are high in those nutrients does not have the same effect. The DASH diet also includes whole grains, fish, and poultry.  The DASH diet is one of several lifestyle changes your doctor may recommend to lower your high blood pressure. Your doctor may also want you to decrease the amount of sodium in your diet. Lowering sodium while following the DASH diet can lower blood pressure even further than just the DASH diet alone.   Follow-up care is a key part of your treatment and safety. Be sure to make and go to all appointments, and call your doctor if you are having problems. It's also a good idea to know your test results and keep a list of the medicines you take.  How can you care for yourself at home?  Following the DASH diet  ?? Eat 4 to 5 servings of fruit each day. A serving is 1 medium-sized piece of fruit, ?? cup chopped or canned fruit, 1/4 cup dried fruit, or 4 ounces (?? cup) of fruit juice. Choose fruit more often than fruit juice.  ?? Eat 4 to 5 servings of vegetables each day. A serving is 1 cup of lettuce or raw leafy vegetables, ?? cup of chopped or cooked vegetables, or 4 ounces (?? cup) of vegetable juice. Choose vegetables more often than vegetable juice.  ?? Get 2 to 3 servings of low-fat and fat-free dairy each day. A serving is 8 ounces of milk, 1 cup of yogurt, or 1 ?? ounces of cheese.  ?? Eat 6 to 8 servings of grains each day.  A serving is 1 slice of bread, 1 ounce of dry cereal, or ?? cup of cooked rice, pasta, or cooked cereal. Try to choose whole-grain products as much as possible.  ?? Limit lean meat, poultry, and fish to 2 servings each day. A serving is 3 ounces, about the size of a deck of cards.  ?? Eat 4 to 5 servings of nuts, seeds, and legumes (cooked dried beans, lentils, and split peas) each week. A serving is 1/3 cup of nuts, 2 tablespoons of seeds, or ?? cup of cooked beans or peas.  ?? Limit fats and oils to 2 to 3 servings each day. A serving is 1 teaspoon of vegetable oil or 2 tablespoons of salad dressing.  ?? Limit sweets and added sugars to 5 servings or less a week. A serving is 1 tablespoon jelly or jam, ?? cup sorbet, or 1 cup of lemonade.  ?? Eat less than 2,300 milligrams (mg) of sodium a day. If you have high blood pressure, diabetes, or chronic kidney disease, if you are African-American, or if you are older than age 53, try to limit the amount of sodium you eat to less than 1,500 mg a day.   Tips for success  ?? Start small. Do not try to make dramatic changes to your diet all at once. You might feel that you are missing out on your favorite foods and then be more likely to not follow the plan. Make small changes, and stick with them. Once those changes become habit, add a few more changes.  ?? Try some of the following:  ?? Make it a goal to eat a fruit or vegetable at every meal and at snacks. This will make it easy to get the recommended amount of fruits and vegetables each day.  ?? Try yogurt topped with fruit and nuts for a snack or healthy dessert.  ?? Add lettuce, tomato, cucumber, and onion to sandwiches.  ?? Combine a ready-made pizza crust with low-fat mozzarella cheese and lots of vegetable toppings. Try using tomatoes, squash, spinach, broccoli, carrots, cauliflower, and onions.  ?? Have a variety of cut-up vegetables with a low-fat dip as an appetizer instead of chips and dip.  ?? Sprinkle sunflower seeds or chopped almonds over salads. Or try adding chopped walnuts or almonds to cooked vegetables.  ?? Try some vegetarian meals using beans and peas. Add garbanzo or kidney beans to salads. Make burritos and tacos with mashed pinto beans or black beans.   Where can you learn more?   Go to [employer].com.cy  Enter H967 in the search box to learn more about "DASH Diet: Care Instructions."   ?? 2006-2015 Healthwise, Incorporated. Care instructions adapted under license by [employer] (which disclaims liability or warranty for this information). This care instruction is for use with your licensed healthcare professional. If you have questions about a medical condition or this instruction, always ask your healthcare professional. [place] any warranty or liability for your use of this information.  Content Version: 10.7.482551; Current as of: February 27, 2013              How to Read a Food Label to Limit Sodium: Care Instructions  Your Care Instructions   Sodium causes your body to hold on to extra water. This can raise your blood pressure and force your heart and kidneys to work harder. In very serious cases, this could cause you to be put in the hospital. It might even be life-threatening. By  limiting sodium, you will feel better and lower your risk of serious problems.  Processed foods, fast food, and restaurant foods are the major sources of dietary sodium. The most common name for sodium is salt. Try to limit how much sodium you eat to less than 2,300 milligrams (mg) a day. And try to limit your sodium to less than 1,500 mg a day if you are 51 or older, are black, or have high blood pressure, diabetes, or chronic kidney disease. This limit counts all the salt you eat in foods you cook or in packaged foods. Keep a list of everything you eat and drink.  Follow-up care is a key part of your treatment and safety. Be sure to make and go to all appointments, and call your doctor if you are having problems. It???s also a good idea to know your test results and keep a list of the medicines you take.  How can you care for yourself at home?  Read ingredient lists on food labels  ?? Read the list of ingredients on food labels to help you find how much sodium is in a food. The label lists the ingredients in a food in descending order (from the most to the least). If salt or sodium is high on the list, there may be a lot of sodium in the food.  ?? Know that sodium has different names. Sodium is also called monosodium glutamate (MSG, common in [place] food), sodium citrate, sodium alginate, sodium hydroxide, and sodium phosphate.  Read Nutrition Facts labels  ?? On most foods, there is a Nutrition Facts label. This will tell you how much sodium is in one serving of food. Look at both the serving size and the sodium amount. The serving size is located at the top of the label, usually right under the "Nutrition Facts" title. The amount of sodium is  given in the list under the title. It is given in milligrams (mg).  ?? Check the serving size carefully. A single serving is often very small, and you may eat more than one serving. If this is the case, you will eat more sodium than listed on the label. For example, if the serving size for a canned soup is 1 cup and the sodium amount is 470 mg, if you have 2 cups you will eat 940 mg of sodium.  ?? The nutrition facts for fresh fruits and vegetables are not listed on the food. They may be listed somewhere in the store. These foods usually have no sodium or low sodium.  ?? The Nutrition Facts label also gives you the Percent Daily Value for sodium. This is how much of the recommended amount of sodium a serving contains. The daily value for sodium is less than 2,300 mg. So if the Percent Daily Value says 50%, this means one serving is giving you half of this, or 1,150 mg.  Buy low-sodium foods  ?? Look for foods that are made with less sodium. Watch for the following words on the label.  ?? "Unsalted" means there is no sodium added to the food. But there may be sodium already in the food naturally.  ?? "Sodium-free" means a serving has less than 5 mg of sodium.  ?? "Very low sodium" means a serving has 35 mg or less of sodium.  ?? "Low-sodium" means a serving has 140 mg or less of sodium.  ?? "Reduced-sodium" means that there is 25% less sodium than what the food normally has. This is still  usually too much sodium. Try not to buy foods with this on the label.  ?? Buy fresh vegetables, or frozen vegetables without added sauces. Buy low-sodium versions of canned vegetables, soups, and other canned goods.   Where can you learn more?   Go to [employer].[email or web address]  Enter B757 in the search box to learn more about "How to Read a Food Label to Limit Sodium: Care Instructions."   ?? 2006-2015 Healthwise, Incorporated. Care instructions adapted under license by [employer] (which disclaims liability or warranty for this  information). This care instruction is for use with your licensed healthcare professional. If you have questions about a medical condition or this instruction, always ask your healthcare professional. [place] any warranty or liability for your use of this information.  Content Version: 10.7.482551; Current as of: February 20, 2013              Saline Nasal Washes: Care Instructions  Your Care Instructions  Saline nasal washes help keep the nasal passages open by washing out thick or dried mucus. This simple remedy can help relieve symptoms of allergies, sinusitis, and colds. It also can make the nose feel more comfortable by keeping the mucous membranes moist. You may notice a little burning sensation in your nose the first few times you use the solution, but this usually gets better in a few days.  Follow-up care is a key part of your treatment and safety. Be sure to make and go to all appointments, and call your doctor if you are having problems. It???s also a good idea to know your test results and keep a list of the medicines you take.  How can you care for yourself at home?  ?? You can buy premixed saline solution in a squeeze bottle or other sinus rinse products at a drugstore. Read and follow the instructions on the label.  ?? You also can make your own saline solution by adding 1 teaspoon of salt and 1 teaspoon of baking soda to 2 cups of distilled water.  ?? If you use a homemade solution, pour a small amount into a clean bowl. Using a rubber bulb syringe, squeeze the syringe and place the tip in the salt water. Pull a small amount of the salt water into the syringe by relaxing your hand.  ?? Sit down with your head tilted slightly back. Do not lie down. Put the tip of the bulb syringe or the squeeze bottle a little way into one of your nostrils. Gently squirt a small amount (about 1 teaspoon) into the nostril. Repeat with the other nostril. Some sneezing and gagging are  normal at first.  ?? Gently blow your nose.  ?? Wipe the syringe or bottle tip clean after each use.  ?? Repeat this 2 or 3 times a day.  ?? Use nasal washes gently if you have nosebleeds often.  When should you call for help?  Watch closely for changes in your health, and be sure to contact your doctor if:  ?? You often get nosebleeds.  ?? You have problems doing the nasal washes.   Where can you learn more?   Go to [employer].com.cy  Enter B784 in the search box to learn more about "Saline Nasal Washes: Care Instructions."   ?? 2006-2015 Healthwise, Incorporated. Care instructions adapted under license by [employer] (which disclaims liability or warranty for this information). This care instruction is for use with your licensed healthcare professional. If you have questions about a medical  condition or this instruction, always ask your healthcare professional. [place] any warranty or liability for your use of this information.  Content Version: 10.7.482551; Current as of: November 21, 2012      Check BP twice weekly.  Notify me if it consistently is above 140/90 or below 90/60.  Bring your blood pressure log to your next office visit.    Decrease salt, fats, caffeine, alcohol in your diet.  Increase water, fiber.  Exercise 5 times weekly for 30 minutes daily as tolerated.  Continue current medications, care and subspecialty follow up.  Visit eye doctor yearly to check your eyes blood pressure related changes.                    Sleep Apnea: Care Instructions  Your Care Instructions  Sleep apnea means that you frequently stop breathing for 10 seconds or longer during sleep. It can be mild to severe, based on the number of times an hour that you stop breathing or have slowed breathing.  Blocked or narrowed airways in your nose, mouth, or throat can cause sleep apnea. Your airway can become blocked when your throat muscles and tongue relax during sleep.   You can treat sleep apnea at home by making lifestyle changes. You also can use a CPAP breathing machine that keeps tissues in the throat from blocking your airway. Or your doctor may suggest that you use a breathing device while you sleep. It helps keep your airway open. This could be a device that you put in your mouth. Other examples include strips or disks that you use on your nose. In some cases, surgery may be needed to remove enlarged tissues in the throat.  Follow-up care is a key part of your treatment and safety. Be sure to make and go to all appointments, and call your doctor if you are having problems. It's also a good idea to know your test results and keep a list of the medicines you take.  How can you care for yourself at home?  ?? Lose weight, if needed. It may reduce the number of times you stop breathing or have slowed breathing.  ?? Sleep on your side. It may stop mild apnea. If you tend to roll onto your back, sew a pocket in the back of your pajama top. Put a tennis ball into the pocket, and stitch the pocket shut. This will help keep you from sleeping on your back.  ?? Avoid alcohol and medicines such as sleeping pills and sedatives before bed.  ?? Do not smoke. Smoking can make sleep apnea worse. If you need help quitting, talk to your doctor about stop-smoking programs and medicines. These can increase your chances of quitting for good.  ?? Prop up the head of your bed 4 to 6 inches by putting bricks under the legs of the bed.  ?? Treat breathing problems, such as a stuffy nose, caused by a cold or allergies.  ?? Try a continuous positive airway pressure (CPAP) breathing machine if your doctor recommends it. The machine keeps your airway open when you sleep.  ?? If CPAP does not work for you, ask your doctor if you can try other breathing machines. A bilevel positive airway pressure machine uses one type of air pressure for breathing in and another type for breathing out.  Another device raises or lowers air pressure as needed while you breathe.  ?? Talk to your doctor if:  ?? Your nose feels dry  or bleeds when you use one of these machines. You may need to increase moisture in the air. A humidifier may help.  ?? Your nose is runny or stuffy from using a breathing machine. Decongestants or a corticosteroid nasal spray may help.  ?? You are sleepy during the day and it gets in the way of the normal things you do. Do not drive when you are drowsy.  When should you call for help?  Watch closely for changes in your health, and be sure to contact your doctor if:  ?? You still have sleep apnea even though you have made lifestyle changes.  ?? You are thinking of trying a device such as CPAP.  ?? You are having problems using a CPAP or similar machine.   Where can you learn more?   Go to [employer].com.cy  Enter J936 in the search box to learn more about "Sleep Apnea: Care Instructions."   ?? 2006-2015 Healthwise, Incorporated. Care instructions adapted under license by [employer] (which disclaims liability or warranty for this information). This care instruction is for use with your licensed healthcare professional. If you have questions about a medical condition or this instruction, always ask your healthcare professional. [place] any warranty or liability for your use of this information.  Content Version: 10.7.482551; Current as of: August 28, 2013

## 2014-03-29 NOTE — Telephone Encounter (Signed)
Discussed with Dr. Lorel Monaco. Dr. Lorel Monaco wants Waverly Ferrari PharmD to helps with a prophylactic medication patient can take for bacterial meningitis.

## 2014-03-29 NOTE — Telephone Encounter (Addendum)
Patient calling to say her sister was admitted to the hospital for bacteria meningitis. Says she was told by her sisters doctor to let her pcp know just incase as a precaution she needed to be prescribed something prophylactically. Patient is requesting a return call to discuss. Her contact # is [phone].

## 2014-03-29 NOTE — Telephone Encounter (Signed)
Spoke with patient after verifying name and DOB regarding symptoms. Patient stated she didn't have any symptoms. Patient stated she was around her sister on Saturday and she took her sister to the hospital on Sunday morning.

## 2014-03-30 MED ORDER — CIPROFLOXACIN 500 MG TAB
500 mg | ORAL_TABLET | Freq: Once | ORAL | Status: AC
Start: 2014-03-30 — End: 2014-03-30

## 2014-03-30 NOTE — Telephone Encounter (Signed)
Verbal orders read back and verified by Dr. Lorel Monaco for accuracy to order Cipro 500 mg for 1 dose.

## 2014-03-30 NOTE — Telephone Encounter (Signed)
Will have nurse send Rx for Ciprofloxacin 500 mg for 1 dose, per pharmacist.

## 2014-03-30 NOTE — Telephone Encounter (Addendum)
Spoke with patient after verifying name and DOB regarding prophylactic medication for bacterial meningitis. Dr. Lorel Monaco states its ok to give patient Ciprofloxacin 500 mg for 1 dose it's recommended chemoprophylaxis regimen for bacterial meningitis. Writer advised patient that ciprofloxacin can increase the effects of cyclobenzaprine. Patient given an opportunity to ask questions, repeated information, and verbalized understanding.

## 2014-04-28 ENCOUNTER — Ambulatory Visit
Admit: 2014-04-28 | Discharge: 2014-04-28 | Payer: PRIVATE HEALTH INSURANCE | Attending: Family Medicine | Primary: Family Medicine

## 2014-04-28 DIAGNOSIS — I10 Essential (primary) hypertension: Secondary | ICD-10-CM

## 2014-04-28 NOTE — Progress Notes (Signed)
HISTORY OF PRESENT ILLNESS  Katrina [name] is a 49 y.o. female presents with Depression; Sleep Problem; Eye Problem; and Results    Agree with nurse note.    Hypertensive, diabetic pt with dyslipidemia, Vitamin D deficiency and hypothyroidism presents to the office with a BP of 136/86.  She is taking Metformin ER 1000 mg BID, but she often forgets her morning dose.  She is also taking Synthroid 88 mcg daily, tolerating well.  She has increased her Vitamin D to 4000IU daily, tolerating well.  She is drinking a smoothie with protein powder, greens, berries, almonds, and a small amount of honey for breakfast.  She is not checking her glucose and does not have a glucometer.  The bronchitis and diarrhea have resolved since her last ov.  She requests her most recent [place] blood work from work in 12/2013.  Her Hgb A1C was 5.6, decreased from 5.9.  She is planning on having a biometric screening with blood work in 06/2014 through her job.      Pt with depression and hx of inadequate sleep; improved with increased dose of Zoloft to 100 mg daily, tolerating well.  She feels that she has come to terms with her busy life and need for life balance.  She is working and going to school.  She feels better when she has enough sleep.  She ran the [employer] 10k several weeks ago and is trying to stay active.  She is no longer beating herself up if she is not making it to the gym.  She is going to bed earlier and is able to sleep 7 hours nightly when she she does not have to finish a paper late at night.  Her fatigue has improved.      Pt complains of L eye swelling.  She accidentally rubbed her eye while on a conference call this morning.  Her last eye exam was on 12/30/13 with Dr. Eli Phillips, but his note is blank.  She has had previous episodes over the last several years.      Written by Coralie Carpen, scribe, as dictated by Dr. Felix Ahmadi, DO.        ROS    Review of Systems negative except as noted above in HPI.     ALLERGIES:    Allergies   Allergen Reactions   ??? Aldactone [Spironolactone] Swelling   ??? Codeine Rash and Itching   ??? Paxil [Paroxetine Hcl] Other (comments)     WORSENED DEPRESSION.       CURRENT MEDICATIONS:    Outpatient Prescriptions Marked as Taking for the 04/28/14 encounter (Office Visit) with Talbert Cage, DO   Medication Sig Dispense Refill   ??? sertraline (ZOLOFT) 100 mg tablet Take 1 Tab by mouth daily. Indications: MAJOR DEPRESSIVE DISORDER 90 Tab 3   ??? fluticasone (FLONASE) 50 mcg/actuation nasal spray 2 Sprays by Both Nostrils route daily. 1 Bottle 0   ??? levothyroxine (SYNTHROID) 88 mcg tablet Take 1 Tab by mouth Daily (before breakfast). Indications: HYPOTHYROIDISM 90 Tab 2   ??? cholecalciferol, vitamin D3, (VITAMIN D3) 2,000 unit tab Take  by mouth.     ??? LORazepam (ATIVAN) 0.5 mg tablet Take 1 Tab by mouth every eight (8) hours as needed for Anxiety. Max Daily Amount: 1.5 mg. Indications: ANXIETY 90 Tab 0   ??? metFORMIN ER (GLUCOPHAGE XR) 500 mg tablet Take 2 Tabs by mouth Before breakfast and dinner. Indications: TYPE 2 DIABETES MELLITUS 360 Tab 3   ???  nystatin-triamcinolone (MYCOLOG) 100,000-0.1 unit/gram-% ointment as needed.     ??? cyclobenzaprine (FLEXERIL) 10 mg tablet 10 mg as needed.     ??? b complex vitamins tablet Take 1 Tab by mouth daily.       ??? vitamin E (AQUA GEMS) 400 unit capsule Take 400 Units by mouth two (2) times a day.         PAST MEDICAL HISTORY:    Past Medical History   Diagnosis Date   ??? Obesity 07/25/2007     Improved with Lifestyle Modifications   ??? Dyslipidemia 28-Apr-2007     Improving with weight loss   ??? Fatty liver 07/25/2007     with elevated AST, ALT.  In NASH study at [facility] (Dr. Hilary Hertz)   ??? Major depression, recurrent ([place]) 1992, 1997, Apr 28, 2007, 05/2013     situational.  fiance's death Apr 28, 1995). Clydell Hakim, counselor.    ??? Heartburn 07/25/2007     Has improved with weight loss   ??? Essential hypertension 1995/04/28      Dr. Mcneil Sober, nephro.  Dr. Eli Phillips, ophth.   ??? 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 Family history of breast cancer in mother 35     MRI, 32 mo.  Dr. Gerre Pebbles   ??? Adrenal adenoma 06/2009     Dr. Mcneil Sober, nephro.  Dr. Boston Service, surgeon.   ??? Chest pain of uncertain etiology 35/05/7320     Dr. Sinclair Ship.  Normal Stress Echo 12/26/09.   ??? Type 2 diabetes, diet controlled ([place]) 04/28/03   ??? Osteoarthritis of lumbar spine      MRI 12/14   ??? Osteoarthritis of thoracic spine      MRI 12/14, Stable   ??? Vitamin D deficiency 05/2010   ??? Abnormal gall bladder diagnostic imaging 07/2000     Abnormal HIDA.  Dr. Tomasita Morrow.   ??? 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  Dr. Gerilyn Nestle.   ??? Chronic insomnia 2009, 2011     Dr. Dineen Kid.   ??? OSA (obstructive sleep apnea) 11/2010     Mild.  Txd CPAP 8 cm H20.  Dr. Bretta Bang.  stopped 11/2011.       PAST SURGICAL HISTORY:    Past Surgical History   Procedure Laterality Date   ??? Pr appendectomy  07/2000     Dr. Tomasita Morrow.   ??? Hx tonsillectomy  04-27-72   ??? Hx breast reduction  1989     Bilaterally.  Dr. Ashby Dawes.    ??? Hx cholecystectomy  07/2000     Gallbladder malfunctioning.  Dr. Tomasita Morrow.   ??? Hx other surgical Right 08/01/09     LAP ADRENALECTOMY.  due to benign adrenal adenoma/tumor.  Dr. Boston Service.   ??? Hx other surgical  2008, 2013     LIVER BIOPSY X 2.         FAMILY HISTORY:    Family History   Problem Relation Age of Onset   ??? Hypertension Mother    ??? Breast Cancer Mother 53   ??? Stroke Mother 79   ??? Diabetes Father    ??? Hypertension Father    ??? Stroke Sister 21     Carotid Artery Dissection   ??? Anxiety Mother    ??? Depression Mother    ??? Other Sister      psedotumor cerebri   ??? Kidney Disease Father      reversed   ???  High Cholesterol Father    ??? Diabetes Paternal Grandmother    ??? Hypertension Paternal Grandmother    ??? Hypertension Paternal Grandfather    ??? High Cholesterol Paternal Grandfather     ??? Diabetes Maternal Grandmother    ??? Hypertension Maternal Grandmother    ??? Diabetes Maternal Grandfather    ??? Hypertension Maternal Grandfather    ??? Cancer Paternal Grandmother      ?LEUKEMIA   ??? Cancer Paternal Uncle      PANCREATIC   ??? Cancer Maternal Uncle      PANCREATIC       SOCIAL HISTORY:    History     Social History   ??? Marital Status: SINGLE     Spouse Name: Single   ??? Number of Children: 0   ??? Years of Education: N/A     Occupational History   ??? [employer]     Social History Main Topics   ??? Smoking status: Former Smoker -- 0.30 packs/day for 10 years     Types: Cigarettes     Quit date: 02/08/2009   ??? Smokeless tobacco: Never Used   ??? Alcohol Use: 0.5 oz/week     1 Glasses of wine per week   ??? Drug Use: No   ??? Sexual Activity: Yes     Other Topics Concern   ??? None     Social History Narrative       IMMUNIZATIONS:    Immunization History   Administered Date(s) Administered   ??? Hep B Vaccine 06/08/2000   ??? Influenza Vaccine 10/14/2013   ??? Influenza Vaccine Split 10/24/2010   ??? Pneumococcal Polysaccharide (PPSV-23) 12/22/2010   ??? Pneumococcal Vaccine (Unspecified Type) 12/05/2005   ??? TD Vaccine 10/23/2000   ??? TDAP Vaccine 10/24/2010         PHYSICAL EXAMINATION    Vital Signs  BP 136/86 mmHg   Pulse 72   Temp(Src) 97.9 ??F (36.6 ??C) (Oral)   Resp 20   Ht 5\' 5"  (1.651 m)   Wt 203 lb 9.6 oz (92.352 kg)   BMI 33.88 kg/m2   SpO2 98%   LMP 04/01/2014    Weight Metrics 04/28/2014 02/26/2014 02/08/2014 11/09/2013 08/04/2013 07/31/2013 07/27/2013   Weight 203 lb 9.6 oz 197 lb 12.8 oz 199 lb 3.2 oz 191 lb 190 lb 190 lb 12.8 oz 188 lb 12.8 oz   BMI 33.88 kg/m2 32.92 kg/m2 33.15 kg/m2 31.78 kg/m2 31.62 kg/m2 31.75 kg/m2 31.42 kg/m2       General appearance - Well nourished. Well appearing.  Well developed.  No acute distress. Overweight.   Head - Normocephalic.  Atraumatic.    Eyes - pupils equal and reactive. Extraocular eye movements intact. Sclera  anicteric.  Moderate hemorrhage noted at the medial aspect of L sclera.  No active bleeding.    Ears - Hearing is grossly normal bilaterally.      Nose - normal and patent.  No polyps noted.  No erythema.  No discharge.    Mouth - mucous membranes with adequate moisture.  Posterior pharynx normal with cobblestone appearance.  No erythema, white exudate or obstruction.  Neck - supple.  Midline trachea.  No carotid bruits noted bilaterally.  No thyromegaly noted.  Chest - clear to auscultation bilaterally anteriorly and posteriorly.  No wheezes.  No rales or rhonchi.  Breath sounds are symmetrical bilaterally.  Unlabored respirations.  Heart - normal rate.  Regular rhythm.  Normal S1, S2.  No murmur noted.  No rubs, clicks or gallops noted.  Abdomen - soft and distended.  No masses or organomegaly.  No rebound, rigidity or guarding.  Bowel sounds normal x 4 quadrants.  No tenderness noted.  Neurological - awake, alert and oriented to person, place, and time and event.  Cranial nerves II through XII intact.  Clear speech.  Muscle strength is +5/5 x 4 extremities.  Sensation is intact to light touch bilaterally.  Steady gait.   Heme/Lymph - peripheral pulses normal x 4 extremities.  No peripheral edema is noted.    Psychological -   normal behavior, dress and thought processes.  Good insight. Good eye contact.  Normal affect.  Appropriate mood.  Normal speech.    DATA REVIEWED    See scanned document.    Results for orders placed or performed in visit on 07/31/13   TSH, 3RD GENERATION   Result Value Ref Range    TSH 1.210 0.450 - 4.500 uIU/mL   T4, FREE   Result Value Ref Range    T4, Free 1.34 0.82 - 1.77 ng/dL   VITAMIN B12 & FOLATE   Result Value Ref Range    Vitamin B12 452 211 - 946 pg/mL    Folate 10.7 >3.0 ng/mL   METHYLMALONIC ACID   Result Value Ref Range    METHYLMALONIC ACID, SERUM 168 0 - 378 nmol/L     Lab Results   Component Value Date/Time    HEMOGLOBIN A1C 6.1 06/27/2011 09:52 AM     HEMOGLOBIN A1C 6.3 05/30/2010 10:33 AM    HEMOGLOBIN A1C (POC) 5.1 12/25/2011 10:19 AM     Lab Results   Component Value Date/Time    VITAMIN D, 25-HYDROXY 27.2 05/25/2013 10:21 AM         ASSESSMENT and PLAN      ICD-10-CM ICD-9-CM    1. Essential hypertension with goal blood pressure less than 130/80 I10 401.9    2. Recurrent major depressive disorder, in full remission ([place]) F33.42 296.36     on Zoloft 100 mg daily   3. Type 2 diabetes, diet controlled ([facility]) E11.9 250.00    4. Dyslipidemia E78.5 272.4    5. Vitamin D deficiency E55.9 268.9    6. Advanced care planning/counseling discussion Z71.89 V65.49    7. Inadequate sleep hygiene Z72.821 307.49     intermittent based on school work, improved   8. Obesity, Class I, BMI 30-34.9 E66.9 278.00    9. Hypothyroidism due to acquired atrophy of thyroid E03.8 244.8     E03.4 246.8     stable   10. Acute bronchitis, unspecified organism J20.9 466.0     resolved after Amoxicillin    11. Diarrhea R19.7 787.91     likely Viral, resolved while on Metformin    12. Subconjunctival hemorrhage, left H11.32 372.72     recurrent       Discussed the patient's BMI with her.  The BMI follow up plan is as follows: BMI is out of normal parameters and plan is as follows: I have counseled this patient on diet and exercise regimens.  Addressed weight, diet and exercise with patient.  Decrease carbohydrates (white foods, sweet foods, sweet drinks and alcohol), increase green leafy vegetables and protein (lean meats and beans) with each meal.  Avoid fried foods. Eat 3-5 small meals daily.  Do not skip meals.  Increase water intake.    Increase physical activity to 30 minutes daily for health benefit or 60 minutes daily to prevent  weight regain, as tolerated.  Get 7-8 hours uninterrupted sleep nightly.  Chart reviewed and updated.    Advised pt to sign release to get ov notes from Dr. Leighton Roach.    Continue current medications and care  Most recent tests reviewed   Recheck pertinent labs when they are ordered and drawn at her workplace.    Get recent office visit notes from Dr. Leighton Roach.    Counseled patient on health concerns: BP, depression, DM, sleep hygiene, subconjunctival hemorrhage.   Relevant handouts given and discussed with patient  Immunizations noted  Offered empathy, support, legitimation, prayers, partnership to patient  Praised patient for progress  Advance Care Booklet given at office visit.   Follow-up Disposition:  Return in about 6 months (around 10/28/2014) for bp, dm.    Patient was offered a choice/choices in the treatment plan today.  Patient expresses understanding of the plan and agrees with recommendations.    Written by Coralie Carpen, scribe, as dictated by Dr. Felix Ahmadi, DO.      Documentation True and Accepted by Lavance Beazer R. Lorel Monaco, D.O.      Patient Instructions       Recovering From Depression: Care Instructions  Your Care Instructions  Taking good care of yourself is important as you recover from depression. In time, your symptoms will fade as your treatment takes hold. Do not give up. Instead, focus your energy on getting better.  Your mood will improve. It just takes some time. Focus on things that can help you feel better, such as being with friends and family, eating well, and getting enough rest. But take things slowly. Do not do too much too soon. You will begin to feel better gradually.  Follow-up care is a key part of your treatment and safety. Be sure to make and go to all appointments, and call your doctor if you are having problems. It's also a good idea to know your test results and keep a list of the medicines you take.  How can you care for yourself at home?  Be realistic  ?? If you have a large task to do, break it up into smaller steps you can handle, and just do what you can.  ?? You may want to put off important decisions until your depression has  lifted. If you have plans that will have a major impact on your life, such as marriage, divorce, or a job change, try to wait a bit. Talk it over with friends and loved ones who can help you look at the overall picture first.  ?? Reaching out to people for help is important. Do not isolate yourself. Let your family and friends help you. Find someone you can trust and confide in, and talk to that person.  ?? Be patient, and be kind to yourself. Remember that depression is not your fault and is not something you can overcome with willpower alone. Treatment is necessary for depression, just like for any other illness. Feeling better takes time, and your mood will improve little by little.  Stay active  ?? Stay busy and get outside. Take a walk, or try some other light exercise.  ?? Talk with your doctor about an exercise program. Exercise can help with mild depression.  ?? Go to a movie or concert. Take part in a church activity or other social gathering. Go to a ball game.  ?? Ask a friend to have dinner with you.  Take care of yourself  ?? Eat a balanced  diet with plenty of fresh fruits and vegetables, whole grains, and lean protein. If you have lost your appetite, eat small snacks rather than large meals.  ?? Avoid drinking alcohol or using illegal drugs. Do not take medicines that have not been prescribed for you. They may interfere with medicines you may be taking for depression, or they may make your depression worse.  ?? Take your medicines exactly as they are prescribed. You may start to feel better within 1 to 3 weeks of taking antidepressant medicine. But it can take as many as 6 to 8 weeks to see more improvement. If you have questions or concerns about your medicines, or if you do not notice any improvement by 3 weeks, talk to your doctor.  ?? If you have any side effects from your medicine, tell your doctor. Antidepressants can make you feel tired, dizzy, or nervous. Some people  have dry mouth, constipation, headaches, sexual problems, or diarrhea. Many of these side effects are mild and will go away on their own after you have been taking the medicine for a few weeks. Some may last longer. Talk to your doctor if side effects are bothering you too much. You might be able to try a different medicine.  ?? Get enough sleep. If you have problems sleeping:  ?? Go to bed at the same time every night, and get up at the same time every morning.  ?? Keep your bedroom dark and quiet.  ?? Do not exercise after 5:00 p.m.  ?? Avoid drinks with caffeine after 5:00 p.m.  ?? Avoid sleeping pills unless they are prescribed by the doctor treating your depression. Sleeping pills may make you groggy during the day, and they may interact with other medicine you are taking.  ?? If you have any other illnesses, such as diabetes, heart disease, or high blood pressure, make sure to continue with your treatment. Tell your doctor about all of the medicines you take, including those with or without a prescription.  ?? Keep the numbers for these national suicide hotlines: 1-800-273-TALK 867-294-0437) and 1-800-SUICIDE 915-503-9591). If you or someone you know talks about suicide or feeling hopeless, get help right away.  When should you call for help?  Call 911 anytime you think you may need emergency care. For example, call if:  ?? You feel like hurting yourself or someone else.  ?? Someone you know has depression and is about to attempt or is attempting suicide.  Call your doctor now or seek immediate medical care if:  ?? You hear voices.  ?? Someone you know has depression and:  ?? Starts to give away his or her possessions.  ?? Uses illegal drugs or drinks alcohol heavily.  ?? Talks or writes about death, including writing suicide notes or talking about guns, knives, or pills.  ?? Starts to spend a lot of time alone.  ?? Acts very aggressively or suddenly appears calm.   Watch closely for changes in your health, and be sure to contact your doctor if:  ?? You do not get better as expected.   Where can you learn more?   Go to [employer].cy  Enter N529 in the search box to learn more about "Recovering From Depression: Care Instructions."   ?? 2006-2016 [employer]wise, Inc[employer]. Care instructions adapted under license by [employer] (which disclaims liability or warranty for this information). This care instruction is for use with your licensed healthcare professional. If you have questions about a medical condition or this instruction,  always ask your healthcare professional. [place] any warranty or liability for your use of this information.  Content Version: 10.8.513193; Current as of: November 27, 2013        Preventing a Relapse of Depression: Care Instructions  Your Care Instructions  A relapse of depression means your symptoms have come back after you have gotten better. This illness often comes and goes during a lifetime. But there are many things you can do to keep it from coming back.  Follow-up care is a key part of your treatment and safety. Be sure to make and go to all appointments, and call your doctor if you are having problems. It's also a good idea to know your test results and keep a list of the medicines you take.  What do you need to know?  Know your risk of relapse  Talk to your doctor to find out if you are at risk of relapse. Many things can make a person more likely to relapse into depression. These include having a family member with depression, dealing with serious problems in a relationship or a job, having a serious medical condition, or abusing drugs or alcohol.  It is important to know your risk and to recognize warning signs of relapse. Once you know these things, you will be better able to keep it from happening to you.  Know the warning signs of relapse  The two most common signs of relapse are:   ?? Feeling sad or hopeless.  ?? Losing interest in your daily activities.  You may have other symptoms, such as:  ?? You lose or gain weight.  ?? You sleep too much or not enough.  ?? You feel restless and unable to sit still.  ?? You feel unable to move.  ?? You feel tired all the time.  ?? You feel unworthy or guilty without an obvious reason.  ?? You have problems concentrating, remembering, or making decisions.  ?? You think often about death or suicide.  ?? You feel angry or have panic attacks.  How can you care for yourself at home?  ?? Take your medicine as prescribed. Call your doctor if you have any problems with your medicine. Many people take their medicines for at least 6 months after they have recovered. This often helps keep symptoms from coming back. However, if your depression keeps coming back, you may have to take medicine for the rest of your life.  ?? Continue counseling even after you have stopped taking medicine.  ?? Eat healthy foods. Include fruits, vegetables, beans, and whole grains in your diet each day.  ?? Get at least 30 minutes of exercise on most days of the week. Walking is a good choice. You also may want to do other activities, such as running, swimming, cycling, or playing tennis or team sports.  ?? See your doctor right away if you have new symptoms or feel that your depression is coming back.  ?? Keep a regular sleep schedule. Try for 8 hours of sleep a night.  ?? Avoid alcohol and illegal drugs.  ?? Keep the numbers for these national suicide hotlines: 1-800-273-TALK 5120946549) and 1-800-SUICIDE 830-640-2813). If you or someone you know talks about suicide or feeling hopeless, get help right away.  When should you call for help?  Call 911 anytime you think you may need emergency care. For example, call if:  ?? You are thinking about suicide or are threatening suicide.  ?? You feel you cannot  stop from hurting yourself or someone else.  ?? You hear or see things that aren't real.   ?? You think or speak in a bizarre way that is not like your usual behavior.  Call your doctor now or seek immediate medical care if:  ?? You are drinking a lot of alcohol or using illegal drugs.  ?? You are talking or writing about death.  Watch closely for changes in your health, and be sure to contact your doctor if:  ?? You find it hard or it's getting harder to deal with school, a job, family, or friends.  ?? You think your treatment is not helping or you are not getting better.  ?? Your symptoms get worse or you get new symptoms.  ?? You have any problems with your antidepressant medicines, such as side effects, or you are thinking about stopping your medicine.  ?? You are having manic behavior, such as having very high energy, needing less sleep than normal, or showing risky behavior such as spending money you don't have or abusing others verbally or physically.   Where can you learn more?   Go to [employer].com.cy  Enter C630 in the search box to learn more about "Preventing a Relapse of Depression: Care Instructions."   ?? 2006-2016 Healthwise, Incorporated. Care instructions adapted under license by [employer] (which disclaims liability or warranty for this information). This care instruction is for use with your licensed healthcare professional. If you have questions about a medical condition or this instruction, always ask your healthcare professional. [place] any warranty or liability for your use of this information.  Content Version: 10.8.513193; Current as of: November 27, 2013        Subconjunctival Hemorrhage: Care Instructions  Your Care Instructions     Sometimes small blood vessels in the white of the eye can break, causing a red spot or speck. This is called a subconjunctival hemorrhage. The blood vessels may break when you sneeze, cough, vomit, strain, or bend over. Sometimes there is no clear cause.   The blood may look alarming, especially if the spot is large. If there is no pain or vision change, there is usually no reason to worry, and the blood slowly will go away on its own in 2 to 3 weeks.  Follow-up care is a key part of your treatment and safety. Be sure to make and go to all appointments, and call your doctor if you are having problems. It???s also a good idea to know your test results and keep a list of the medicines you take.  How can you care for yourself at home?  ?? Watch for changes in your eye. It is normal for the red spot on your eyeball to change color as it heals. Just like a bruise on your skin, it may change from red to brown to purple to yellow.  ?? Do not take aspirin or products that contain aspirin, which can increase bleeding. Use acetaminophen (Tylenol) if you need pain relief for another problem.  ?? Do not take two or more pain medicines at the same time unless the doctor told you to. Many pain medicines have acetaminophen, which is Tylenol. Too much acetaminophen (Tylenol) can be harmful.  When should you call for help?  Call your doctor now or seek immediate medical care if:  ?? You see blood over the black part of your eye (pupil).  ?? You have any changes or problems in your vision.  ?? You  have any pain in your eye.  ?? You have any discharge from your eye.  Watch closely for changes in your health, and be sure to contact your doctor if:  ?? Your eye color is not steadily returning to normal.  ?? The blood has not gone away after 2 to 3 weeks.  ?? You develop bruising or bleeding elsewhere, such as the gums or the skin, or you have nosebleeds.   Where can you learn more?   Go to [email or web address]  Enter [phone] in the search box to learn more about "Subconjunctival Hemorrhage: Care Instructions."   ?? 2006-2016 Healthwise, Incorporated. Care instructions adapted under license by [employer] (which disclaims liability or warranty for this  information). This care instruction is for use with your licensed healthcare professional. If you have questions about a medical condition or this instruction, always ask your healthcare professional. [place] any warranty or liability for your use of this information.  Content Version: 10.8.513193; Current as of: August 28, 2013        Subconjunctival Hemorrhage: Care Instructions  Your Care Instructions     Sometimes small blood vessels in the white of the eye can break, causing a red spot or speck. This is called a subconjunctival hemorrhage. The blood vessels may break when you sneeze, cough, vomit, strain, or bend over. Sometimes there is no clear cause.  The blood may look alarming, especially if the spot is large. If there is no pain or vision change, there is usually no reason to worry, and the blood slowly will go away on its own in 2 to 3 weeks.  Follow-up care is a key part of your treatment and safety. Be sure to make and go to all appointments, and call your doctor if you are having problems. It???s also a good idea to know your test results and keep a list of the medicines you take.  How can you care for yourself at home?  ?? Watch for changes in your eye. It is normal for the red spot on your eyeball to change color as it heals. Just like a bruise on your skin, it may change from red to brown to purple to yellow.  ?? Do not take aspirin or products that contain aspirin, which can increase bleeding. Use acetaminophen (Tylenol) if you need pain relief for another problem.  ?? Do not take two or more pain medicines at the same time unless the doctor told you to. Many pain medicines have acetaminophen, which is Tylenol. Too much acetaminophen (Tylenol) can be harmful.  When should you call for help?  Call your doctor now or seek immediate medical care if:  ?? You see blood over the black part of your eye (pupil).  ?? You have any changes or problems in your vision.   ?? You have any pain in your eye.  ?? You have any discharge from your eye.  Watch closely for changes in your health, and be sure to contact your doctor if:  ?? Your eye color is not steadily returning to normal.  ?? The blood has not gone away after 2 to 3 weeks.  ?? You develop bruising or bleeding elsewhere, such as the gums or the skin, or you have nosebleeds.   Where can you learn more?   Go to [email or web address].[email or web address]  Enter [phone] in the search box to learn more about "Subconjunctival Hemorrhage: Care Instructions."   ?? 2006-2016 Healthwise, Incorporated. Care instructions adapted under license by [facility]  [place] (which disclaims liability or warranty for this information). This care instruction is for use with your licensed healthcare professional. If you have questions about a medical condition or this instruction, always ask your healthcare professional. [place] any warranty or liability for your use of this information.  Content Version: 10.8.513193; Current as of: August 28, 2013        Starting a Weight Loss Plan: Care Instructions  Your Care Instructions  If you are thinking about losing weight, it can be hard to know where to start. Your doctor can help you set up a weight loss plan that best meets your needs. You may want to take a class on nutrition or exercise, or join a weight loss support group. If you have questions about how to make changes to your eating or exercise habits, ask your doctor about seeing a registered dietitian or an exercise specialist.  It can be a big challenge to lose weight. But you do not have to make huge changes at once. Make small changes, and stick with them. When those changes become habit, add a few more changes.  If you do not think you are ready to make changes right now, try to pick a date in the future. Make an appointment to see your doctor to discuss whether the time is right for you to start a plan.   Follow-up care is a key part of your treatment and safety. Be sure to make and go to all appointments, and call your doctor if you are having problems. It???s also a good idea to know your test results and keep a list of the medicines you take.  How can you care for yourself at home?  ?? Set realistic goals. Many people expect to lose much more weight than is likely. A weight loss of 5% to 10% of your body weight may be enough to improve your health.  ?? Get family and friends involved to provide support. Talk to them about why you are trying to lose weight, and ask them to help. They can help by participating in exercise and having meals with you, even if they may be eating something different.  ?? Find what works best for you. If you do not have time or do not like to cook, a program that offers meal replacement bars or shakes may be better for you. Or if you like to prepare meals, finding a plan that includes daily menus and recipes may be best.  ?? Ask your doctor about other health professionals who can help you achieve your weight loss goals.  ?? A dietitian can help you make healthy changes in your diet.  ?? An exercise specialist or personal trainer can help you develop a safe and effective exercise program.  ?? A counselor or psychiatrist can help you cope with issues such as depression, anxiety, or family problems that can make it hard to focus on weight loss.  ?? Consider joining a support group for people who are trying to lose weight. Your doctor can suggest groups in your area.   Where can you learn more?   Go to [email or web address]  Enter U357 in the search box to learn more about "Starting a Weight Loss Plan: Care Instructions."   ?? 2006-2016 Healthwise, Incorporated. Care instructions adapted under license by [employer] (which disclaims liability or warranty for this information). This care instruction is for use with your licensed  healthcare professional. If you have questions about a medical condition  or this instruction, always ask your healthcare professional. [place] any warranty or liability for your use of this information.  Content Version: 10.8.513193; Current as of: February 27, 2013    WEIGHT LOSS PLAN    1.  Read food labels and count calories.  Goal 1000-1200 calories daily for women and 1200-1600 calories daily for men.  Achieve this by decreasing caloric intake by 500 calories by weekly increments.  NOTE:  1 pound = 3500 calories!  Www.loseit.com  BikingRewards.pl  Www.sparkpeople.com  Www.healthfinder.gov  Weight watchers mobile    Calories needed to lose 1-2 pounds a week = 10 x your weight in pounds    2.  Increase water intake.    Per [facility], it is important to drink 1/2 your body weight in ounces of water daily.  Decrease your water consumption 2-3 hours before bedtime to prevent sleep disturbance from frequent urination.    3.  Decrease sugary beverages.  Each can or glass of soda increases your risk of obesity by 60%.  Can lose 10 pounds in a month by avoiding any soda.     12 oz can of soda = 140 calories, 16 oz cup of sweet tea = 200 calories    16 oz orange juice = 200 calories, 10 oz apple juice = 150 calories   32 oz sports drink = 200 calories, 16 oz punch = 240 calories   3.5 oz alcohol = 100-150 calories     4.  Avoid High Fructose Corn Syrup products.  This ingredient makes products highly addictive!    5.  Exercise 30 minutes daily 5 days weekly, minimally.  If you burn 3500 calories that equals a pound!  Use a pedometer to count steps. Visit www.JustKeepMoving.com for a free pedometer and diet recommendations.      Your maximum heart rate = 220 - your age    Never exercise at your maximum heart rate.    See handout for target heart rate.    6.  Decrease carbohydrates (white bread, pasta, rice, potatoes, sweet  foods and sweet drinks like soda, tea, coffee, juice and sports drinks).  Increase fiber and protein.    Goal:  50-150 calories daily of carbohydrates     Try CHROMIUM PICONATE 200 MG THREE TIMES DAILY,    to decrease Premenstrual carbohydrate cravings.    7.  Eat 3-5 small meals daily, include lots of protein (beans/legumes, nuts, lean meat, eggs) and green vegetables with each.  (Breakfast, lunch, dinner with 2 healthy snacks)    8.  Get proper rest 7-8 hours uninterrupted.  When you get less than 6 hours, it triggers hunger by affecting your [place]:Leptin ratio and this results in weight gain.    9.  Watch your food portions.  Green leafy vegetables should cover 1/2 of the plate, lean meat 1/4 of the plate, starchy vegetable 1/4 of the plate.  Use smaller plates.    10.  Do not eat until you are full.  Eat until you are no longer hungry.  If you are not sure, try drinking a glass of water before getting your second serving of food.    11.  Do not weigh yourself daily.  Wait until your next office visit.  Use how you feel and  how your clothes fit as measurements of success.    12.  Address your spirituality to draw strength from above during your journey.  Remember "I am fearfully and wonderfully  made.  Marvelous in His eyes."    13.  Set realistic, appropriate and achievable weight loss goals:     RECOMMENDED TARGET WEIGHT LOSS:  Initial weight loss of 5-10% of your initial body weight achieved over 6 months or a decrease of 2 BMI units.     MINIMUM GOAL OF WEIGHT LOSS:  Reduce body weight and maintain a lower body weight.  Prevent weight gain.          RELATED WEBSITES:      Www.obesityaction.org  (and consider joining the Obesity Action Coalition for $25/year.  The [facility] mission is to elevated and empower those affected by obesity through education, advocacy and support.  Quarterly journals included in membership fee.)    Www.thebiggestloser.com  PatentHood.ch      RELATED DIETS:  Dr. Allene Dillon [place] Weight loss challenge, Mediterranean diet, Dewy Rose, [employer] Watchers, Paleo Diet (anti-inflammatory diet)        EXERCISE 150 MINUTES WEEKLY.  THIS CAN BE ACHIEVED BY WORKING OUT OR WALKING A MINIMUM OF 30 MINUTES FOR 5 DAYS WEEKLY.  YOU CAN EXERCISE IN INCREMENTS OF 10-15 MINUTES UP TO 3 TIMES A DAY.    Consider performing "brainless exercise."  Choose your favorite tv program.  Five minutes before and for 5 minutes after the tv program, stretch your body.  While the program is on, walk in place watching the show.  When commercials come on, rest or walk around the house to do other things.  When the program begins, return to walking in place.  When you are able keep walking during the commercials and add light weights to your ankles or hands.  By the end of the show, you would have walked 30 minutes.  If you need shorter spurts of exercise, walk during the commercials and rest during the show.    Drink to glasses of water prior to any exercise to prevent dehydration and to improve the results of the work out.      Your RESTING HEART RATE is the number of times your heart beats per minute when you are not exerting yourself.  The more fit you are, the lower your resting heart rate will be.    Your MAXIMUM HEART RATE is the number of times per minute your heart pumps when it is working at 100% capacity.  NEVER EXERCISE AT YOUR MAXIMUM HEART RATE.    MAX HEART RATE = 220 - your age    For example, in a 47 year old, the maximum heart rate is 220-50 = 170 beats per minute    Your [place] is the number of beats per minute our heart should pump during aerobic exercise.  Reaching your target heart rate indicates that your body is receiving maximum cardiovascular and fat burning benefits.     If you are fit, your TARGET HEART RATE = 70-85% of your maximum Heart rate      For a 47 year old with vigorous intensity physical activity,          Target heart rate= 170 bpm x .70 = 119 bpm     Target heart rate = 170 bpm x .85=  145 bpm        Therefore, your target heart rate during physical activity is 119-145      If you are not fit, TARGET HEART RATE = 50-70% of your maximum Heart rate    MODERATE CONSISTENT AEROBIC EXERCISE OR WALKING FOR AT LEAST 150  MINUTES WEEKLY IS AN ESSENTIAL PART OF ANY WEIGHT LOSS OF WEIGHT MAINTENANCE PROGRAM.     During WEIGHT LOSS PHASE, at least 75% of your exercise needs to be walking or moderate aerobic activity and 25% or 0% can be Isometric or Resistance type exercises (the latter can be deferred to the weight maintenance phase.)     During WEIGHT MAINTENANCE PHASE, at least 50% of your exercise needs to be aerobic and 50% Isometric or Resistance type exercises.     BENEFITS OF MODERATE INTENSITY EXERCISE MOST DAYS OF THE WEEK:     1.  Insulin Resistance improves 30-85%   2.  Abdominal Fat decreases by 30%   3.  Inflammatory Markers decrease by 30% (therefore pain decreases)   4.  Systolic and Diastolic Blood Pressure decrease by 4 mmHg   5.  HDL improves by 5%   6.  Triglycerides decrease by 15%   7.  Shift from small dense LDL to large dense LDL (therefore decrease Insulin Resistance)   8.  Decrease coagulability                 Starting a Weight Loss Plan: Care Instructions  Your Care Instructions  If you are thinking about losing weight, it can be hard to know where to start. Your doctor can help you set up a weight loss plan that best meets your needs. You may want to take a class on nutrition or exercise, or join a weight loss support group. If you have questions about how to make changes to your eating or exercise habits, ask your doctor about seeing a registered dietitian or an exercise specialist.  It can be a big challenge to lose weight. But you do not have to make huge changes at once. Make small changes, and stick with them. When those changes become habit, add a few more changes.   If you do not think you are ready to make changes right now, try to pick a date in the future. Make an appointment to see your doctor to discuss whether the time is right for you to start a plan.  Follow-up care is a key part of your treatment and safety. Be sure to make and go to all appointments, and call your doctor if you are having problems. It???s also a good idea to know your test results and keep a list of the medicines you take.  How can you care for yourself at home?  ?? Set realistic goals. Many people expect to lose much more weight than is likely. A weight loss of 5% to 10% of your body weight may be enough to improve your health.  ?? Get family and friends involved to provide support. Talk to them about why you are trying to lose weight, and ask them to help. They can help by participating in exercise and having meals with you, even if they may be eating something different.  ?? Find what works best for you. If you do not have time or do not like to cook, a program that offers meal replacement bars or shakes may be better for you. Or if you like to prepare meals, finding a plan that includes daily menus and recipes may be best.  ?? Ask your doctor about other health professionals who can help you achieve your weight loss goals.  ?? A dietitian can help you make healthy changes in your diet.  ?? An exercise specialist or personal trainer can help you develop a  safe and effective exercise program.  ?? A counselor or psychiatrist can help you cope with issues such as depression, anxiety, or family problems that can make it hard to focus on weight loss.  ?? Consider joining a support group for people who are trying to lose weight. Your doctor can suggest groups in your area.   Where can you learn more?   Go to GreenNylon.[email or web address]  Enter U357 in the search box to learn more about "Starting a Weight Loss Plan: Care Instructions."   ?? 2006-2016 Healthwise, Incorporated. Care instructions adapted under  license by [employer] (which disclaims liability or warranty for this information). This care instruction is for use with your licensed healthcare professional. If you have questions about a medical condition or this instruction, always ask your healthcare professional. [place] any warranty or liability for your use of this information.  Content Version: 10.8.513193; Current as of: February 27, 2013

## 2014-04-28 NOTE — Patient Instructions (Signed)
Recovering From Depression: Care Instructions  Your Care Instructions  Taking good care of yourself is important as you recover from depression. In time, your symptoms will fade as your treatment takes hold. Do not give up. Instead, focus your energy on getting better.  Your mood will improve. It just takes some time. Focus on things that can help you feel better, such as being with friends and family, eating well, and getting enough rest. But take things slowly. Do not do too much too soon. You will begin to feel better gradually.  Follow-up care is a key part of your treatment and safety. Be sure to make and go to all appointments, and call your doctor if you are having problems. It's also a good idea to know your test results and keep a list of the medicines you take.  How can you care for yourself at home?  Be realistic  ?? If you have a large task to do, break it up into smaller steps you can handle, and just do what you can.  ?? You may want to put off important decisions until your depression has lifted. If you have plans that will have a major impact on your life, such as marriage, divorce, or a job change, try to wait a bit. Talk it over with friends and loved ones who can help you look at the overall picture first.  ?? Reaching out to people for help is important. Do not isolate yourself. Let your family and friends help you. Find someone you can trust and confide in, and talk to that person.  ?? Be patient, and be kind to yourself. Remember that depression is not your fault and is not something you can overcome with willpower alone. Treatment is necessary for depression, just like for any other illness. Feeling better takes time, and your mood will improve little by little.  Stay active  ?? Stay busy and get outside. Take a walk, or try some other light exercise.  ?? Talk with your doctor about an exercise program. Exercise can help with mild depression.   ?? Go to a movie or concert. Take part in a church activity or other social gathering. Go to a ball game.  ?? Ask a friend to have dinner with you.  Take care of yourself  ?? Eat a balanced diet with plenty of fresh fruits and vegetables, whole grains, and lean protein. If you have lost your appetite, eat small snacks rather than large meals.  ?? Avoid drinking alcohol or using illegal drugs. Do not take medicines that have not been prescribed for you. They may interfere with medicines you may be taking for depression, or they may make your depression worse.  ?? Take your medicines exactly as they are prescribed. You may start to feel better within 1 to 3 weeks of taking antidepressant medicine. But it can take as many as 6 to 8 weeks to see more improvement. If you have questions or concerns about your medicines, or if you do not notice any improvement by 3 weeks, talk to your doctor.  ?? If you have any side effects from your medicine, tell your doctor. Antidepressants can make you feel tired, dizzy, or nervous. Some people have dry mouth, constipation, headaches, sexual problems, or diarrhea. Many of these side effects are mild and will go away on their own after you have been taking the medicine for a few weeks. Some may last longer. Talk to your doctor if side effects are   bothering you too much. You might be able to try a different medicine.  ?? Get enough sleep. If you have problems sleeping:  ?? Go to bed at the same time every night, and get up at the same time every morning.  ?? Keep your bedroom dark and quiet.  ?? Do not exercise after 5:00 p.m.  ?? Avoid drinks with caffeine after 5:00 p.m.  ?? Avoid sleeping pills unless they are prescribed by the doctor treating your depression. Sleeping pills may make you groggy during the day, and they may interact with other medicine you are taking.  ?? If you have any other illnesses, such as diabetes, heart disease, or  high blood pressure, make sure to continue with your treatment. Tell your doctor about all of the medicines you take, including those with or without a prescription.  ?? Keep the numbers for these national suicide hotlines: 1-800-273-TALK 513-266-8127) and 1-800-SUICIDE 320 821 6741). If you or someone you know talks about suicide or feeling hopeless, get help right away.  When should you call for help?  Call 911 anytime you think you may need emergency care. For example, call if:  ?? You feel like hurting yourself or someone else.  ?? Someone you know has depression and is about to attempt or is attempting suicide.  Call your doctor now or seek immediate medical care if:  ?? You hear voices.  ?? Someone you know has depression and:  ?? Starts to give away his or her possessions.  ?? Uses illegal drugs or drinks alcohol heavily.  ?? Talks or writes about death, including writing suicide notes or talking about guns, knives, or pills.  ?? Starts to spend a lot of time alone.  ?? Acts very aggressively or suddenly appears calm.  Watch closely for changes in your health, and be sure to contact your doctor if:  ?? You do not get better as expected.   Where can you learn more?   Go to [email or web address].[email or web address]  Enter N529 in the search box to learn more about "Recovering From Depression: Care Instructions."   ?? 2006-2016 Healthwise, Incorporated. Care instructions adapted under license by [employer] (which disclaims liability or warranty for this information). This care instruction is for use with your licensed healthcare professional. If you have questions about a medical condition or this instruction, always ask your healthcare professional. [place] any warranty or liability for your use of this information.  Content Version: 10.8.513193; Current as of: November 27, 2013        Preventing a Relapse of Depression: Care Instructions  Your Care Instructions   A relapse of depression means your symptoms have come back after you have gotten better. This illness often comes and goes during a lifetime. But there are many things you can do to keep it from coming back.  Follow-up care is a key part of your treatment and safety. Be sure to make and go to all appointments, and call your doctor if you are having problems. It's also a good idea to know your test results and keep a list of the medicines you take.  What do you need to know?  Know your risk of relapse  Talk to your doctor to find out if you are at risk of relapse. Many things can make a person more likely to relapse into depression. These include having a family member with depression, dealing with serious problems in a relationship or a job, having a serious medical condition, or abusing  drugs or alcohol.  It is important to know your risk and to recognize warning signs of relapse. Once you know these things, you will be better able to keep it from happening to you.  Know the warning signs of relapse  The two most common signs of relapse are:  ?? Feeling sad or hopeless.  ?? Losing interest in your daily activities.  You may have other symptoms, such as:  ?? You lose or gain weight.  ?? You sleep too much or not enough.  ?? You feel restless and unable to sit still.  ?? You feel unable to move.  ?? You feel tired all the time.  ?? You feel unworthy or guilty without an obvious reason.  ?? You have problems concentrating, remembering, or making decisions.  ?? You think often about death or suicide.  ?? You feel angry or have panic attacks.  How can you care for yourself at home?  ?? Take your medicine as prescribed. Call your doctor if you have any problems with your medicine. Many people take their medicines for at least 6 months after they have recovered. This often helps keep symptoms from coming back. However, if your depression keeps coming back, you may have to take medicine for the rest of your life.   ?? Continue counseling even after you have stopped taking medicine.  ?? Eat healthy foods. Include fruits, vegetables, beans, and whole grains in your diet each day.  ?? Get at least 30 minutes of exercise on most days of the week. Walking is a good choice. You also may want to do other activities, such as running, swimming, cycling, or playing tennis or team sports.  ?? See your doctor right away if you have new symptoms or feel that your depression is coming back.  ?? Keep a regular sleep schedule. Try for 8 hours of sleep a night.  ?? Avoid alcohol and illegal drugs.  ?? Keep the numbers for these national suicide hotlines: 1-800-273-TALK 325 081 3097) and 1-800-SUICIDE 250 373 0789). If you or someone you know talks about suicide or feeling hopeless, get help right away.  When should you call for help?  Call 911 anytime you think you may need emergency care. For example, call if:  ?? You are thinking about suicide or are threatening suicide.  ?? You feel you cannot stop from hurting yourself or someone else.  ?? You hear or see things that aren't real.  ?? You think or speak in a bizarre way that is not like your usual behavior.  Call your doctor now or seek immediate medical care if:  ?? You are drinking a lot of alcohol or using illegal drugs.  ?? You are talking or writing about death.  Watch closely for changes in your health, and be sure to contact your doctor if:  ?? You find it hard or it's getting harder to deal with school, a job, family, or friends.  ?? You think your treatment is not helping or you are not getting better.  ?? Your symptoms get worse or you get new symptoms.  ?? You have any problems with your antidepressant medicines, such as side effects, or you are thinking about stopping your medicine.  ?? You are having manic behavior, such as having very high energy, needing less sleep than normal, or showing risky behavior such as spending money you don't have or abusing others verbally or physically.    Where can you learn more?   Go to [email or web address].[email or web address]  Enter [phone] in the search box to learn more about "Preventing a Relapse of Depression: Care Instructions."   ?? 2006-2016 Healthwise, Incorporated. Care instructions adapted under license by [employer] (which disclaims liability or warranty for this information). This care instruction is for use with your licensed healthcare professional. If you have questions about a medical condition or this instruction, always ask your healthcare professional. [place] any warranty or liability for your use of this information.  Content Version: 10.8.513193; Current as of: November 27, 2013        Subconjunctival Hemorrhage: Care Instructions  Your Care Instructions     Sometimes small blood vessels in the white of the eye can break, causing a red spot or speck. This is called a subconjunctival hemorrhage. The blood vessels may break when you sneeze, cough, vomit, strain, or bend over. Sometimes there is no clear cause.  The blood may look alarming, especially if the spot is large. If there is no pain or vision change, there is usually no reason to worry, and the blood slowly will go away on its own in 2 to 3 weeks.  Follow-up care is a key part of your treatment and safety. Be sure to make and go to all appointments, and call your doctor if you are having problems. It???s also a good idea to know your test results and keep a list of the medicines you take.  How can you care for yourself at home?  ?? Watch for changes in your eye. It is normal for the red spot on your eyeball to change color as it heals. Just like a bruise on your skin, it may change from red to brown to purple to yellow.  ?? Do not take aspirin or products that contain aspirin, which can increase bleeding. Use acetaminophen (Tylenol) if you need pain relief for another problem.  ?? Do not take two or more pain medicines at the same time unless the  doctor told you to. Many pain medicines have acetaminophen, which is Tylenol. Too much acetaminophen (Tylenol) can be harmful.  When should you call for help?  Call your doctor now or seek immediate medical care if:  ?? You see blood over the black part of your eye (pupil).  ?? You have any changes or problems in your vision.  ?? You have any pain in your eye.  ?? You have any discharge from your eye.  Watch closely for changes in your health, and be sure to contact your doctor if:  ?? Your eye color is not steadily returning to normal.  ?? The blood has not gone away after 2 to 3 weeks.  ?? You develop bruising or bleeding elsewhere, such as the gums or the skin, or you have nosebleeds.   Where can you learn more?   Go to [email or web address].[email or web address]  Enter [phone] in the search box to learn more about "Subconjunctival Hemorrhage: Care Instructions."   ?? 2006-2016 Healthwise, Incorporated. Care instructions adapted under license by [employer] (which disclaims liability or warranty for this information). This care instruction is for use with your licensed healthcare professional. If you have questions about a medical condition or this instruction, always ask your healthcare professional. [place] any warranty or liability for your use of this information.  Content Version: 10.8.513193; Current as of: August 28, 2013        Subconjunctival Hemorrhage: Care Instructions  Your Care Instructions     Sometimes small blood vessels in the white  of the eye can break, causing a red spot or speck. This is called a subconjunctival hemorrhage. The blood vessels may break when you sneeze, cough, vomit, strain, or bend over. Sometimes there is no clear cause.  The blood may look alarming, especially if the spot is large. If there is no pain or vision change, there is usually no reason to worry, and the blood slowly will go away on its own in 2 to 3 weeks.   Follow-up care is a key part of your treatment and safety. Be sure to make and go to all appointments, and call your doctor if you are having problems. It???s also a good idea to know your test results and keep a list of the medicines you take.  How can you care for yourself at home?  ?? Watch for changes in your eye. It is normal for the red spot on your eyeball to change color as it heals. Just like a bruise on your skin, it may change from red to brown to purple to yellow.  ?? Do not take aspirin or products that contain aspirin, which can increase bleeding. Use acetaminophen (Tylenol) if you need pain relief for another problem.  ?? Do not take two or more pain medicines at the same time unless the doctor told you to. Many pain medicines have acetaminophen, which is Tylenol. Too much acetaminophen (Tylenol) can be harmful.  When should you call for help?  Call your doctor now or seek immediate medical care if:  ?? You see blood over the black part of your eye (pupil).  ?? You have any changes or problems in your vision.  ?? You have any pain in your eye.  ?? You have any discharge from your eye.  Watch closely for changes in your health, and be sure to contact your doctor if:  ?? Your eye color is not steadily returning to normal.  ?? The blood has not gone away after 2 to 3 weeks.  ?? You develop bruising or bleeding elsewhere, such as the gums or the skin, or you have nosebleeds.   Where can you learn more?   Go to [email or web address].[email or web address]  Enter [phone] in the search box to learn more about "Subconjunctival Hemorrhage: Care Instructions."   ?? 2006-2016 Healthwise, Incorporated. Care instructions adapted under license by [employer] (which disclaims liability or warranty for this information). This care instruction is for use with your licensed healthcare professional. If you have questions about a medical condition or this instruction, always ask your healthcare professional. [place] any warranty or liability for your use of this information.  Content Version: 10.8.513193; Current as of: August 28, 2013        Starting a Weight Loss Plan: Care Instructions  Your Care Instructions  If you are thinking about losing weight, it can be hard to know where to start. Your doctor can help you set up a weight loss plan that best meets your needs. You may want to take a class on nutrition or exercise, or join a weight loss support group. If you have questions about how to make changes to your eating or exercise habits, ask your doctor about seeing a registered dietitian or an exercise specialist.  It can be a big challenge to lose weight. But you do not have to make huge changes at once. Make small changes, and stick with them. When those changes become habit, add a few more changes.  If you do not think  you are ready to make changes right now, try to pick a date in the future. Make an appointment to see your doctor to discuss whether the time is right for you to start a plan.  Follow-up care is a key part of your treatment and safety. Be sure to make and go to all appointments, and call your doctor if you are having problems. It???s also a good idea to know your test results and keep a list of the medicines you take.  How can you care for yourself at home?  ?? Set realistic goals. Many people expect to lose much more weight than is likely. A weight loss of 5% to 10% of your body weight may be enough to improve your health.  ?? Get family and friends involved to provide support. Talk to them about why you are trying to lose weight, and ask them to help. They can help by participating in exercise and having meals with you, even if they may be eating something different.  ?? Find what works best for you. If you do not have time or do not like to cook, a program that offers meal replacement bars or shakes may be better for you. Or if you like to prepare meals, finding a plan that includes  daily menus and recipes may be best.  ?? Ask your doctor about other health professionals who can help you achieve your weight loss goals.  ?? A dietitian can help you make healthy changes in your diet.  ?? An exercise specialist or personal trainer can help you develop a safe and effective exercise program.  ?? A counselor or psychiatrist can help you cope with issues such as depression, anxiety, or family problems that can make it hard to focus on weight loss.  ?? Consider joining a support group for people who are trying to lose weight. Your doctor can suggest groups in your area.   Where can you learn more?   Go to [email or web address].[email or web address]  Enter U357 in the search box to learn more about "Starting a Weight Loss Plan: Care Instructions."   ?? 2006-2016 Healthwise, Incorporated. Care instructions adapted under license by [employer] (which disclaims liability or warranty for this information). This care instruction is for use with your licensed healthcare professional. If you have questions about a medical condition or this instruction, always ask your healthcare professional. [place] any warranty or liability for your use of this information.  Content Version: 10.8.513193; Current as of: February 27, 2013    WEIGHT LOSS PLAN    1.  Read food labels and count calories.  Goal 1000-1200 calories daily for women and 1200-1600 calories daily for men.  Achieve this by decreasing caloric intake by 500 calories by weekly increments.  NOTE:  1 pound = 3500 calories!  Www.loseit.com  BikingRewards.pl  Www.sparkpeople.com  Www.healthfinder.gov  Weight watchers mobile    Calories needed to lose 1-2 pounds a week = 10 x your weight in pounds    2.  Increase water intake.    Per [facility], it is important to drink 1/2 your body weight in ounces of water daily.  Decrease your water consumption 2-3 hours before bedtime to prevent sleep  disturbance from frequent urination.    3.  Decrease sugary beverages.  Each can or glass of soda increases your risk of obesity by 60%.  Can lose 10 pounds in a month by avoiding any soda.     12 oz  can of soda = 140 calories, 16 oz cup of sweet tea = 200 calories    16 oz orange juice = 200 calories, 10 oz apple juice = 150 calories   32 oz sports drink = 200 calories, 16 oz punch = 240 calories   3.5 oz alcohol = 100-150 calories     4.  Avoid High Fructose Corn Syrup products.  This ingredient makes products highly addictive!    5.  Exercise 30 minutes daily 5 days weekly, minimally.  If you burn 3500 calories that equals a pound!  Use a pedometer to count steps. Visit www.JustKeepMoving.com for a free pedometer and diet recommendations.      Your maximum heart rate = 220 - your age    Never exercise at your maximum heart rate.    See handout for target heart rate.    6.  Decrease carbohydrates (white bread, pasta, rice, potatoes, sweet foods and sweet drinks like soda, tea, coffee, juice and sports drinks).  Increase fiber and protein.    Goal:  50-150 calories daily of carbohydrates     Try CHROMIUM PICONATE 200 MG THREE TIMES DAILY,    to decrease Premenstrual carbohydrate cravings.    7.  Eat 3-5 small meals daily, include lots of protein (beans/legumes, nuts, lean meat, eggs) and green vegetables with each.  (Breakfast, lunch, dinner with 2 healthy snacks)    8.  Get proper rest 7-8 hours uninterrupted.  When you get less than 6 hours, it triggers hunger by affecting your [place]:Leptin ratio and this results in weight gain.    9.  Watch your food portions.  Green leafy vegetables should cover 1/2 of the plate, lean meat 1/4 of the plate, starchy vegetable 1/4 of the plate.  Use smaller plates.    10.  Do not eat until you are full.  Eat until you are no longer hungry.  If you are not sure, try drinking a glass of water before getting your second serving of food.     11.  Do not weigh yourself daily.  Wait until your next office visit.  Use how you feel and  how your clothes fit as measurements of success.    12.  Address your spirituality to draw strength from above during your journey.  Remember "I am fearfully and wonderfully made.  Marvelous in His eyes."    13.  Set realistic, appropriate and achievable weight loss goals:     RECOMMENDED TARGET WEIGHT LOSS:  Initial weight loss of 5-10% of your initial body weight achieved over 6 months or a decrease of 2 BMI units.     MINIMUM GOAL OF WEIGHT LOSS:  Reduce body weight and maintain a lower body weight.  Prevent weight gain.          RELATED WEBSITES:      Www.obesityaction.org  (and consider joining the Obesity Action Coalition for $25/year.  The [facility] mission is to elevated and empower those affected by obesity through education, advocacy and support.  Quarterly journals included in membership fee.)    Www.thebiggestloser.com  PatentHood.ch     RELATED DIETS:  Dr. Allene Dillon Million Pound Weight loss challenge, Mediterranean diet, [place], [employer] Watchers, Paleo Diet (anti-inflammatory diet)        EXERCISE 150 MINUTES WEEKLY.  THIS CAN BE ACHIEVED BY WORKING OUT OR WALKING A MINIMUM OF 30 MINUTES FOR 5 DAYS WEEKLY.  YOU CAN EXERCISE IN INCREMENTS OF 10-15 MINUTES UP TO 3 TIMES  A DAY.    Consider performing "brainless exercise."  Choose your favorite tv program.  Five minutes before and for 5 minutes after the tv program, stretch your body.  While the program is on, walk in place watching the show.  When commercials come on, rest or walk around the house to do other things.  When the program begins, return to walking in place.  When you are able keep walking during the commercials and add light weights to your ankles or hands.  By the end of the show, you would have walked 30 minutes.  If you need shorter spurts of exercise, walk during the commercials and rest during the show.     Drink to glasses of water prior to any exercise to prevent dehydration and to improve the results of the work out.      Your RESTING HEART RATE is the number of times your heart beats per minute when you are not exerting yourself.  The more fit you are, the lower your resting heart rate will be.    Your MAXIMUM HEART RATE is the number of times per minute your heart pumps when it is working at 100% capacity.  NEVER EXERCISE AT YOUR MAXIMUM HEART RATE.    MAX HEART RATE = 220 - your age    For example, in a 47 year old, the maximum heart rate is 220-50 = 170 beats per minute    Your [place] is the number of beats per minute our heart should pump during aerobic exercise.  Reaching your target heart rate indicates that your body is receiving maximum cardiovascular and fat burning benefits.     If you are fit, your TARGET HEART RATE = 70-85% of your maximum Heart rate      For a 47 year old with vigorous intensity physical activity,         Target heart rate= 170 bpm x .70 = 119 bpm     Target heart rate = 170 bpm x .85=  145 bpm        Therefore, your target heart rate during physical activity is 119-145      If you are not fit, TARGET HEART RATE = 50-70% of your maximum Heart rate    MODERATE CONSISTENT AEROBIC EXERCISE OR WALKING FOR AT LEAST 150 MINUTES WEEKLY IS AN ESSENTIAL PART OF ANY WEIGHT LOSS OF WEIGHT MAINTENANCE PROGRAM.     During WEIGHT LOSS PHASE, at least 75% of your exercise needs to be walking or moderate aerobic activity and 25% or 0% can be Isometric or Resistance type exercises (the latter can be deferred to the weight maintenance phase.)     During WEIGHT MAINTENANCE PHASE, at least 50% of your exercise needs to be aerobic and 50% Isometric or Resistance type exercises.     BENEFITS OF MODERATE INTENSITY EXERCISE MOST DAYS OF THE WEEK:     1.  Insulin Resistance improves 30-85%   2.  Abdominal Fat decreases by 30%   3.  Inflammatory Markers decrease by 30% (therefore pain decreases)    4.  Systolic and Diastolic Blood Pressure decrease by 4 mmHg   5.  HDL improves by 5%   6.  Triglycerides decrease by 15%   7.  Shift from small dense LDL to large dense LDL (therefore decrease Insulin Resistance)   8.  Decrease coagulability                 Starting a Weight Loss  Plan: Care Instructions  Your Care Instructions  If you are thinking about losing weight, it can be hard to know where to start. Your doctor can help you set up a weight loss plan that best meets your needs. You may want to take a class on nutrition or exercise, or join a weight loss support group. If you have questions about how to make changes to your eating or exercise habits, ask your doctor about seeing a registered dietitian or an exercise specialist.  It can be a big challenge to lose weight. But you do not have to make huge changes at once. Make small changes, and stick with them. When those changes become habit, add a few more changes.  If you do not think you are ready to make changes right now, try to pick a date in the future. Make an appointment to see your doctor to discuss whether the time is right for you to start a plan.  Follow-up care is a key part of your treatment and safety. Be sure to make and go to all appointments, and call your doctor if you are having problems. It???s also a good idea to know your test results and keep a list of the medicines you take.  How can you care for yourself at home?  ?? Set realistic goals. Many people expect to lose much more weight than is likely. A weight loss of 5% to 10% of your body weight may be enough to improve your health.  ?? Get family and friends involved to provide support. Talk to them about why you are trying to lose weight, and ask them to help. They can help by participating in exercise and having meals with you, even if they may be eating something different.  ?? Find what works best for you. If you do not have time or do not like to  cook, a program that offers meal replacement bars or shakes may be better for you. Or if you like to prepare meals, finding a plan that includes daily menus and recipes may be best.  ?? Ask your doctor about other health professionals who can help you achieve your weight loss goals.  ?? A dietitian can help you make healthy changes in your diet.  ?? An exercise specialist or personal trainer can help you develop a safe and effective exercise program.  ?? A counselor or psychiatrist can help you cope with issues such as depression, anxiety, or family problems that can make it hard to focus on weight loss.  ?? Consider joining a support group for people who are trying to lose weight. Your doctor can suggest groups in your area.   Where can you learn more?   Go to [email or web address]  Enter U357 in the search box to learn more about "Starting a Weight Loss Plan: Care Instructions."   ?? 2006-2016 [employer] Inc[employer]. Care instructions adapted under license by [employer] (which disclaims liability or warranty for this information). This care instruction is for use with your licensed healthcare professional. If you have questions about a medical condition or this instruction, always ask your healthcare professional. [place] any warranty or liability for your use of this information.  Content Version: 10.8.513193; Current as of: February 27, 2013

## 2014-04-28 NOTE — Progress Notes (Signed)
Chief Complaint   Patient presents with   ??? Depression     pt state it has decrease due to increase in her Zoloft   ??? Sleep Problem     pt state it is better she know it is from working, school and not eating correctly.   ??? Eye Problem     pt state she rubbed her left eye and noticed it was red this morning.     1. Have you been to the ER, urgent care clinic since your last visit?  Hospitalized since your last visit?No    2. Have you seen or consulted any other health care providers outside of the [place] [place] since your last visit?  Include any pap smears or colon screening. No     [facility] Care Booklet given to pt.

## 2014-05-06 LAB — HM MAMMOGRAPHY: Mammography, External: NEGATIVE

## 2014-08-30 NOTE — Telephone Encounter (Signed)
LOV: 04-28-2014  NOV: 10-29-2014    LAB: 07-31-2013  TSH: 1.210

## 2014-09-01 MED ORDER — LEVOTHYROXINE 88 MCG TAB
88 mcg | ORAL_TABLET | ORAL | 0 refills | Status: DC
Start: 2014-09-01 — End: 2014-12-03

## 2014-09-17 ENCOUNTER — Inpatient Hospital Stay
Admit: 2014-09-17 | Discharge: 2014-09-17 | Disposition: A | Discharge status: Home / Self Care | Payer: BLUE CROSS/BLUE SHIELD | Attending: Emergency Medicine

## 2014-09-17 ENCOUNTER — Emergency Department: Admit: 2014-09-17 | Payer: BLUE CROSS/BLUE SHIELD | Primary: Family Medicine

## 2014-09-17 DIAGNOSIS — R079 Chest pain, unspecified: Secondary | ICD-10-CM

## 2014-09-17 LAB — D-DIMER, QUANTITATIVE: D-Dimer, Quant: 0.3 mg/L FEU (ref 0.00–0.65)

## 2014-09-17 LAB — METABOLIC PANEL, COMPREHENSIVE
A-G Ratio: 1.3 (ref 1.1–2.2)
ALT (SGPT): 56 U/L (ref 12–78)
AST (SGOT): 27 U/L (ref 15–37)
Albumin: 4.1 g/dL (ref 3.5–5.0)
Alk. phosphatase: 98 U/L (ref 45–117)
Anion gap: 10 mmol/L (ref 5–15)
BUN/Creatinine ratio: 16 (ref 12–20)
BUN: 12 MG/DL (ref 6–20)
Bilirubin, total: 0.3 MG/DL (ref 0.2–1.0)
CO2: 25 mmol/L (ref 21–32)
Calcium: 9.3 MG/DL (ref 8.5–10.1)
Chloride: 105 mmol/L (ref 97–108)
Creatinine: 0.75 MG/DL (ref 0.55–1.02)
GFR est AA: >60 mL/min/{1.73_m2} (ref >60)
GFR est non-AA: >60 mL/min/{1.73_m2} (ref >60)
Globulin: 3.1 g/dL (ref 2.0–4.0)
Glucose: 95 mg/dL (ref 65–100)
Potassium: 3.6 mmol/L (ref 3.5–5.1)
Protein, total: 7.2 g/dL (ref 6.4–8.2)
Sodium: 140 mmol/L (ref 136–145)

## 2014-09-17 LAB — EKG, 12 LEAD, INITIAL
Atrial Rate: 89 {beats}/min
Calculated P Axis: 56 degrees
Calculated R Axis: 58 degrees
Calculated T Axis: 25 degrees
Diagnosis: NORMAL
P-R Interval: 136 ms
Q-T Interval: 402 ms
QRS Duration: 82 ms
QTC Calculation (Bezet): 489 ms
Ventricular Rate: 89 {beats}/min

## 2014-09-17 LAB — CBC WITH AUTOMATED DIFF
ABS. BASOPHILS: 0 10*3/uL (ref 0.0–0.1)
ABS. EOSINOPHILS: 0.3 10*3/uL (ref 0.0–0.4)
ABS. LYMPHOCYTES: 2.6 10*3/uL (ref 0.8–3.5)
ABS. MONOCYTES: 0.5 10*3/uL (ref 0.0–1.0)
ABS. NEUTROPHILS: 5.5 10*3/uL (ref 1.8–8.0)
BASOPHILS: 0 % (ref 0–1)
EOSINOPHILS: 3 % (ref 0–7)
HCT: 41 % (ref 35.0–47.0)
HGB: 13.6 g/dL (ref 11.5–16.0)
LYMPHOCYTES: 29 % (ref 12–49)
MCH: 28.2 PG (ref 26.0–34.0)
MCHC: 33.2 g/dL (ref 30.0–36.5)
MCV: 84.9 FL (ref 80.0–99.0)
MONOCYTES: 6 % (ref 5–13)
NEUTROPHILS: 62 % (ref 32–75)
PLATELET: 326 10*3/uL (ref 150–400)
RBC: 4.83 M/uL (ref 3.80–5.20)
RDW: 13 % (ref 11.5–14.5)
WBC: 8.9 10*3/uL (ref 3.6–11.0)

## 2014-09-17 LAB — D DIMER: D-dimer: 0.3 mg/L FEU (ref 0.00–0.65)

## 2014-09-17 LAB — TROPONIN I
Troponin-I, Qt.: <0.04 ng/mL (ref <0.05)
Troponin-I, Qt.: <0.04 ng/mL (ref <0.05)

## 2014-09-17 LAB — GLUCOSE, POC: Glucose (POC): 83 mg/dL (ref 65–100)

## 2014-09-17 LAB — HEMOGLOBIN A1C WITH EAG
Est. average glucose: 123 mg/dL
Hemoglobin A1c: 5.9 % (ref 4.2–6.3)

## 2014-09-17 MED ORDER — INSULIN LISPRO 100 UNIT/ML INJECTION
100 unit/mL | Freq: Four times a day (QID) | SUBCUTANEOUS | Status: DC
Start: 2014-09-17 — End: 2014-09-18
  Administered 2014-09-17: 21:00:00 via SUBCUTANEOUS

## 2014-09-17 MED ORDER — SODIUM CHLORIDE 0.9 % IJ SYRG
Freq: Three times a day (TID) | INTRAMUSCULAR | Status: DC
Start: 2014-09-17 — End: 2014-09-18
  Administered 2014-09-17 – 2014-09-18 (×3): via INTRAVENOUS

## 2014-09-17 MED ORDER — SODIUM CHLORIDE 0.9 % IJ SYRG
INTRAMUSCULAR | Status: DC | PRN
Start: 2014-09-17 — End: 2014-09-18

## 2014-09-17 MED ORDER — DEXTROSE 50% IN WATER (D50W) IV SYRG
INTRAVENOUS | Status: DC | PRN
Start: 2014-09-17 — End: 2014-09-18

## 2014-09-17 MED ORDER — GLUCOSE 4 GRAM CHEWABLE TAB
4 gram | ORAL | Status: DC | PRN
Start: 2014-09-17 — End: 2014-09-18

## 2014-09-17 MED ORDER — ASPIRIN 81 MG CHEWABLE TAB
81 mg | ORAL | Status: AC
Start: 2014-09-17 — End: 2014-09-17
  Administered 2014-09-17: 18:00:00 via ORAL

## 2014-09-17 MED ORDER — GLUCAGON 1 MG INJECTION
1 mg | INTRAMUSCULAR | Status: DC | PRN
Start: 2014-09-17 — End: 2014-09-18

## 2014-09-17 MED FILL — MONOJECT PREFILL ADVANCED 0.9 % SODIUM CHLORIDE INJECTION SYRINGE: INTRAMUSCULAR | Qty: 10

## 2014-09-17 MED FILL — BAYER CHEWABLE LOW DOSE ASPIRIN 81 MG TABLET: 81 mg | ORAL | Qty: 4

## 2014-09-17 MED FILL — BD POSIFLUSH NORMAL SALINE 0.9 % INJECTION SYRINGE: INTRAMUSCULAR | Qty: 10

## 2014-09-17 NOTE — Progress Notes (Signed)
TRANSFER - IN REPORT:    Verbal report received from megan(name) on Ivar Drape  being received from ed(unit) for routine progression of care      Report consisted of patient???s Situation, Background, Assessment and   Recommendations(SBAR).     Information from the following report(s) SBAR, Kardex, Procedure Summary, Intake/Output, MAR and Recent Results was reviewed with the receiving nurse.    Opportunity for questions and clarification was provided.      Assessment completed upon patient???s arrival to unit and care assumed.

## 2014-09-17 NOTE — ED Provider Notes (Signed)
HPI Comments:  This patient comes in with left-sided tightness in her neck, and shoulder. She also has had exertional shortness of breath for many months. She attributed this to deconditioning and weight gain. Over the past several days the exertional dyspnea has gotten much worse. Today's symptoms started several hours ago while she was walking up her steps at home. She has a history of hypertension, hyperlipidemia and type 2 diabetes. She is an ex-smoker.    Had 9 hr trip to [place] in the past 1-2 weeks.  Also had a prolonged plane trip beginning of 2022-08-18.    Mother died suddenly of unk cause.  Had stroke earlier in life.  Sister had a stroke from carotid dissection at age 20.  No early cad.      Patient is a 49 y.o. female presenting with chest pain and shortness of breath.   Chest Pain (Angina)    Associated symptoms include shortness of breath.   Shortness of Breath   Associated symptoms include chest pain.        Past Medical History:   Diagnosis Date   ??? Abnormal gall bladder diagnostic imaging 08-17-2000     Abnormal HIDA.  Dr. Tomasita Morrow.   ??? Adrenal adenoma 06/2009     Dr. Mcneil Sober, nephro.  Dr. Boston Service, surgeon.   ??? 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  Dr. Gerilyn Nestle.   ??? Chest pain of uncertain etiology 29/05/2839     Dr. Sinclair Ship.  Normal Stress Echo 12/26/09.   ??? Chronic insomnia 2009, 2011     Dr. Dineen Kid.   ??? Dyslipidemia March 21, 2007     Improving with weight loss   ??? Essential hypertension 03-21-95     Dr. Mcneil Sober, nephro.  Dr. Eli Phillips, ophth.   ??? Family history of breast cancer in mother 54     MRI, 6 mo.  Dr. Gerre Pebbles   ??? Fatty liver 07/25/2007     with elevated AST, ALT.  In NASH study at [facility] (Dr. Hilary Hertz)   ??? Heartburn 07/25/2007     Has improved with weight loss   ??? 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  fiance's death (March 21, 1995). Clydell Hakim, counselor.     ??? Obesity 07/25/2007     Improved with Lifestyle Modifications   ??? OSA (obstructive sleep apnea) 11/2010     Mild.  Txd CPAP 8 cm H20.  Dr. Bretta Bang.  stopped 11/2011.   ??? Osteoarthritis of lumbar spine      MRI 12/14   ??? Osteoarthritis of thoracic spine      MRI 12/14, Stable   ??? Type 2 diabetes, diet controlled ([place]) 2003/03/21   ??? Vitamin D deficiency 05/2010       Past Surgical History:   Procedure Laterality Date   ??? Pr appendectomy  Aug 17, 2000     Dr. Tomasita Morrow.   ??? Hx tonsillectomy  03/20/1972   ??? Hx breast reduction  1989     Bilaterally.  Dr. Ashby Dawes.    ??? Hx cholecystectomy  08-17-00     Gallbladder malfunctioning.  Dr. Tomasita Morrow.   ??? Hx other surgical Right 08/01/09     LAP ADRENALECTOMY.  due to benign adrenal adenoma/tumor.  Dr. Boston Service.   ??? Hx other surgical  2008, 2013     LIVER BIOPSY X 2.  Family History:   Problem Relation Age of Onset   ??? Hypertension Mother    ??? Breast Cancer Mother 89   ??? Stroke Mother 25   ??? Anxiety Mother    ??? Depression Mother    ??? Diabetes Father    ??? Hypertension Father    ??? Kidney Disease Father      reversed   ??? High Cholesterol Father    ??? Stroke Sister 21     Carotid Artery Dissection   ??? Other Sister      psedotumor cerebri   ??? Diabetes Paternal Grandmother    ??? Hypertension Paternal Grandmother    ??? Cancer Paternal Grandmother      ?LEUKEMIA   ??? Hypertension Paternal Grandfather    ??? High Cholesterol Paternal Grandfather    ??? Diabetes Maternal Grandmother    ??? Hypertension Maternal Grandmother    ??? Diabetes Maternal Grandfather    ??? Hypertension Maternal Grandfather    ??? Cancer Paternal Uncle      PANCREATIC   ??? Cancer Maternal Uncle      PANCREATIC       Social History     Social History   ??? Marital status: SINGLE     Spouse name: Single   ??? Number of children: 0   ??? Years of education: N/A     Occupational History   ??? [employer]     Social History Main Topics   ??? Smoking status: Former Smoker     Packs/day: 0.30     Years: 10.00      Types: Cigarettes     Quit date: 02/08/2009   ??? Smokeless tobacco: Never Used   ??? Alcohol use 0.5 oz/week     1 Glasses of wine per week   ??? Drug use: No   ??? Sexual activity: Yes     Other Topics Concern   ??? Not on file     Social History Narrative         ALLERGIES: Aldactone [spironolactone]; Codeine; and Paxil [paroxetine hcl]    Review of Systems   Respiratory: Positive for shortness of breath.    Cardiovascular: Positive for chest pain.   All other systems reviewed and are negative.      Vitals:    09/17/14 1243 09/17/14 1254 09/17/14 1315   BP: (!) 182/110 (!) 161/91 168/86   Pulse: 80 88 87   Resp: 18 17 17    Temp: 98.2 ??F (36.8 ??C)     SpO2: 100% 95% 97%            Physical Exam      Constitutional: Pt is awake and alert.  Pt appears well-developed and well-nourished. NAD.  HENT:   Head: Normocephalic and atraumatic.   Nose: Nose normal.   Mouth/Throat: Oropharynx is clear and moist. No oropharyngeal exudate.   Eyes: Conjunctivae and extraocular motions are normal. Pupils are equal, round, and reactive to light. Right eye exhibits no discharge. Left eye exhibits no discharge. No scleral icterus.   Neck: No tracheal deviation present. Supple neck.  Cardiovascular: Normal rate, regular rhythm, normal heart sounds and intact distal pulses.  Exam reveals no gallop and no friction rub.    No murmur heard.  Pulmonary/Chest: Effort normal and breath sounds normal.  Pt  has no wheezes.  Pt  has no rales.   Abdominal: Soft.  Pt  exhibits no distension and no mass. No tenderness.  Pt  has no rebound and  no guarding.   Musculoskeletal:  Pt  exhibits no edema and no tenderness.   Ext: Normal ROM in all four extremities; not tender to palpation; distal pulses are normal, no edema.   Neurological:  Pt is alert.  nonfocal neuro exam.  Skin: Skin is warm and dry.  Pt  is not diaphoretic.   Psychiatric:  Pt  has a normal mood and affect. Behavior is normal.             MDM  ED Course       Procedures       ED EKG interpretation:  Rhythm: normal sinus rhythm; and regular . Rate (approx.): 89; Axis: normal; P wave: normal; QRS interval: normal ; ST/T wave: normal; This EKG was interpreted by Horris Latino, DO,ED Provider.      PERC-negative.  Doubt VTE today.      Consulted hospitalist who will see.    Has exertional dyspnea worse x few days.  [place] is a possibility.  Will admit.    Recent Results (from the past 12 hour(s))   EKG, 12 LEAD, INITIAL    Collection Time: 09/17/14 12:39 PM   Result Value Ref Range    Ventricular Rate 89 BPM    Atrial Rate 89 BPM    P-R Interval 136 ms    QRS Duration 82 ms    Q-T Interval 402 ms    QTC Calculation (Bezet) 489 ms    Calculated P Axis 56 degrees    Calculated R Axis 58 degrees    Calculated T Axis 25 degrees    Diagnosis Normal sinus rhythm  No previous ECGs available      CBC WITH AUTOMATED DIFF    Collection Time: 09/17/14  1:08 PM   Result Value Ref Range    WBC 8.9 3.6 - 11.0 K/uL    RBC 4.83 3.80 - 5.20 M/uL    HGB 13.6 11.5 - 16.0 g/dL    HCT 41.0 35.0 - 47.0 %    MCV 84.9 80.0 - 99.0 FL    MCH 28.2 26.0 - 34.0 PG    MCHC 33.2 30.0 - 36.5 g/dL    RDW 13.0 11.5 - 14.5 %    PLATELET 326 150 - 400 K/uL    NEUTROPHILS 62 32 - 75 %    LYMPHOCYTES 29 12 - 49 %    MONOCYTES 6 5 - 13 %    EOSINOPHILS 3 0 - 7 %    BASOPHILS 0 0 - 1 %    ABS. NEUTROPHILS 5.5 1.8 - 8.0 K/UL    ABS. LYMPHOCYTES 2.6 0.8 - 3.5 K/UL    ABS. MONOCYTES 0.5 0.0 - 1.0 K/UL    ABS. EOSINOPHILS 0.3 0.0 - 0.4 K/UL    ABS. BASOPHILS 0.0 0.0 - 0.1 K/UL   METABOLIC PANEL, COMPREHENSIVE    Collection Time: 09/17/14  1:08 PM   Result Value Ref Range    Sodium 140 136 - 145 mmol/L    Potassium 3.6 3.5 - 5.1 mmol/L    Chloride 105 97 - 108 mmol/L    CO2 25 21 - 32 mmol/L    Anion gap 10 5 - 15 mmol/L    Glucose 95 65 - 100 mg/dL    BUN 12 6 - 20 MG/DL    Creatinine 0.75 0.55 - 1.02 MG/DL    BUN/Creatinine ratio 16 12 - 20      GFR est AA >60 >60 ml/min/1.31m  GFR est non-AA >60 >60 ml/min/1.23m     Calcium 9.3 8.5 - 10.1 MG/DL    Bilirubin, total 0.3 0.2 - 1.0 MG/DL    ALT 56 12 - 78 U/L    AST 27 15 - 37 U/L    Alk. phosphatase 98 45 - 117 U/L    Protein, total 7.2 6.4 - 8.2 g/dL    Albumin 4.1 3.5 - 5.0 g/dL    Globulin 3.1 2.0 - 4.0 g/dL    A-G Ratio 1.3 1.1 - 2.2     TROPONIN I    Collection Time: 09/17/14  1:08 PM   Result Value Ref Range    Troponin-I, Qt. <0.04 <0.05 ng/mL

## 2014-09-17 NOTE — Telephone Encounter (Signed)
Spoke with patient, ID verified X 2. Patient stated she was triaged by her Nurse Practitioner at work and she was informed to follow up with her PCP. Patient stated that her blood pressure was 182/108 and she feels tightness in her upper back. This Probation officer informed patient that she should go to the ER to be evaluated. Patient verbalized understanding

## 2014-09-17 NOTE — ED Notes (Signed)
Patient ambulatory to restroom.

## 2014-09-17 NOTE — ED Notes (Signed)
Patient transported to xray

## 2014-09-17 NOTE — Telephone Encounter (Signed)
Patient called in and said her BP was very high.  I triaged patient with a nurse.

## 2014-09-17 NOTE — ED Triage Notes (Signed)
CP with SOB radiating into left shoulder onset this morning.

## 2014-09-17 NOTE — Other (Signed)
Chart reviewed for medical necessity.  CM available for transitions of care planning.  SUSAN LAWTON, MA, BSW, ACM

## 2014-09-17 NOTE — Progress Notes (Signed)
Patient is resting in bed she denies having any pain or discomfort at this time. She has family at the bedside and is comfortable at this time. Will continue to monitor patient for changes in her condition.    2330 patient is resting in bed she denies having any pain or discomfort at this time.    2345 blood drawn and sent to lab.    0400 patient is resting in bed no complaints blood sent to lab.    0730 Bedside and Verbal shift change report given to Nunzio Cory, RN  (oncoming nurse) by Rodena Piety, RN  (offgoing nurse). Report included the following information SBAR, MAR and Med Rec Status.

## 2014-09-17 NOTE — H&P (Addendum)
History & Physical  Clinical Observation Unit    Date of admission: 09/17/2014    Patient name: Katrina [name]  MRN: [id number]  Date of birth: 11-17-1965  Age: 49 y.o.     Primary care provider:  Talbert Cage, DO     Source of Information: patient, medical records                                Chief complain: chest pain    History of present illness  Katrina [name] is a 49 y.o. female with past medical history of HTN, Hyperlipidemia, Hypothyroidism, and Type 2 Diabetes who presents with chest pain. Patient states this morning she walked on the treadmil for 30 minutes, came home and notice she was short of breath and had chest pressure/squeezing. Patient also notes pressure down her left arm. Patient states nothing makes the pressure better however activity makes it worse. Patient states the shortness of breath has been going on for the last few months. States that she has noticed the shortness of breath with going up stairs. Patient states she had put the shortness of breath off because she assumed it was due to not exercising and a poor diet. However she became concerned when the chest pain had started. Patient also notes nausea in the morning for the last few weeks. Patient notes she has a similar episode about 5 years ago. She states she went to see a Hydrologist and had a full work up with echo and stress and reports all were negative. Patient states she use to be on blood pressure and cholesterol medication but was able to come off the medication with diet and exercise changes. Patient also notes that she recently travelled 9 hours to [place] a few weeks ago. Patient denies any pain with inspiration, edema, dizziness, weakness, urinary symptoms, or any recent illnesses. Patient's sister has a h/o multiple DVTs with Factor 5 Deficiency. Initial  troponin and EKG normal in ER, will admit to observation for further work up.     Past Medical History   Diagnosis Date   ??? Abnormal gall bladder diagnostic imaging 07/2000     Abnormal HIDA.  Dr. Tomasita Morrow.   ??? Adrenal adenoma 06/2009     Dr. Mcneil Sober, nephro.  Dr. Boston Service, surgeon.   ??? 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  Dr. Gerilyn Nestle.   ??? Chest pain of uncertain etiology 95/06/2128     Dr. Sinclair Ship.  Normal Stress Echo 12/26/09.   ??? Chronic insomnia 2009, 2011     Dr. Dineen Kid.   ??? Dyslipidemia 03-23-07     Improving with weight loss   ??? Essential hypertension 03/23/95     Dr. Mcneil Sober, nephro.  Dr. Eli Phillips, ophth.   ??? Family history of breast cancer in mother 41     MRI, 6 mo.  Dr. Gerre Pebbles   ??? Fatty liver 07/25/2007     with elevated AST, ALT.  In NASH study at [facility] (Dr. Hilary Hertz)   ??? Heartburn 07/25/2007     Has improved with weight loss   ??? 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 Major depression, recurrent ([place]) 1990-03-22, 03/23/1995, 03/23/07, 05/2013     situational.  fiance's death (03-23-1995). Clydell Hakim, counselor.    ??? Obesity 07/25/2007     Improved with Lifestyle Modifications   ???  OSA (obstructive sleep apnea) 11/2010     Mild.  Txd CPAP 8 cm H20.  Dr. Bretta Bang.  stopped 11/2011.   ??? Osteoarthritis of lumbar spine      MRI 12/14   ??? Osteoarthritis of thoracic spine      MRI 12/14, Stable   ??? Type 2 diabetes, diet controlled ([place]) 2005   ??? Vitamin D deficiency 05/2010      Past Surgical History   Procedure Laterality Date   ??? Pr appendectomy  07/2000     Dr. Tomasita Morrow.   ??? Hx tonsillectomy  1974   ??? Hx breast reduction  1989     Bilaterally.  Dr. Ashby Dawes.    ??? Hx cholecystectomy  07/2000     Gallbladder malfunctioning.  Dr. Tomasita Morrow.   ??? Hx other surgical Right 08/01/09     LAP ADRENALECTOMY.  due to benign adrenal adenoma/tumor.  Dr. Boston Service.   ??? Hx other surgical  2008, 2013     LIVER BIOPSY X 2.       Prior to Admission medications     Medication Sig Start Date End Date Taking? Authorizing Provider   levothyroxine (SYNTHROID) 88 mcg tablet TAKE 1 TABLET DAILY BEFORE BREAKFAST FOR HYPOTHYROIDISM 08/31/14  Yes Talbert Cage, DO   sertraline (ZOLOFT) 100 mg tablet Take 1 Tab by mouth daily. Indications: MAJOR DEPRESSIVE DISORDER 02/26/14  Yes Talbert Cage, DO   cholecalciferol, vitamin D3, (VITAMIN D3) 2,000 unit tab Take 4,000 Units by mouth daily.   Yes Historical Provider   metFORMIN ER (GLUCOPHAGE XR) 500 mg tablet Take 2 Tabs by mouth Before breakfast and dinner. Indications: TYPE 2 DIABETES MELLITUS 11/09/13  Yes Talbert Cage, DO   b complex vitamins tablet Take 1 Tab by mouth daily.     Yes Historical Provider   ascorbic acid (VITAMIN C) 500 mg tablet Take 500 mg by mouth daily.   Yes Historical Provider   vitamin E (AQUA GEMS) 400 unit capsule Take 400 Units by mouth daily. 11/02/08  Yes Historical Provider     Allergies   Allergen Reactions   ??? Aldactone [Spironolactone] Swelling   ??? Codeine Rash and Itching   ??? Paxil [Paroxetine Hcl] Other (comments)     WORSENED DEPRESSION.      Family History   Problem Relation Age of Onset   ??? Hypertension Mother    ??? Breast Cancer Mother 73   ??? Stroke Mother 23   ??? Anxiety Mother    ??? Depression Mother    ??? Diabetes Father    ??? Hypertension Father    ??? Kidney Disease Father      reversed   ??? High Cholesterol Father    ??? Stroke Sister 21     Carotid Artery Dissection   ??? Other Sister      psedotumor cerebri   ??? Diabetes Paternal Grandmother    ??? Hypertension Paternal Grandmother    ??? Cancer Paternal Grandmother      ?LEUKEMIA   ??? Hypertension Paternal Grandfather    ??? High Cholesterol Paternal Grandfather    ??? Diabetes Maternal Grandmother    ??? Hypertension Maternal Grandmother    ??? Diabetes Maternal Grandfather    ??? Hypertension Maternal Grandfather    ??? Cancer Paternal Uncle      PANCREATIC   ??? Cancer Maternal Uncle      PANCREATIC      Family history reviewed and non-contributory.  Social history  Patient resides  X  Independently      With family care      Assisted living      SNF    Ambulates  X  Independently      With cane       Assisted walker         Alcohol history     None   X  Social     Chronic   Smoking history  X  None     Former smoker     Current smoker       Code status  X  Full code     DNR/DNI        Code status discussed with the patient/caregivers.  Full Code    Review of systems  The patient denies any fever, chills, chest pain, cough, congestion, recent illness, palpitations, or dysuria.   A comprehensive review of systems was negative except for: Cardiovascular: positive for chest pain, exertional chest pressure/discomfort  Gastrointestinal: positive for nausea   The remainder of the review of systems was reviewed and is noncontributory.    Physical Examination   Visit Vitals   ??? BP 167/84   ??? Pulse 92   ??? Temp 98.6 ??F (37 ??C)   ??? Resp 12   ??? LMP 09/01/2014   ??? SpO2 97%          O2 Device: Room air    General:  Alert, cooperative, no distress   Head:  Normocephalic, without obvious abnormality, atraumatic   Eyes:  Conjunctivae/corneas clear. PERRL, EOMs intact   E/N/M/T: Nares normal. Septum midline. No nasal drainage or sinus tenderness  Lips, mucosa, and tongue normal   Teeth and gums normal  Clear oropharynx   Neck: Normal appearance and movements, symmetrical, trachea midline  No palpable adenopathy  No thyroid enlargement, tenderness or nodules  No carotid bruit   Normal JVP   Lungs:   Symmetrical chest expansion and respiratory effort  Clear to auscultation bilaterally   Chest wall:  No tenderness or deformity   Heart:  Regular rhythm   Sounds normal; no murmur, click, rub or gallop   Abdomen:   Soft, no tenderness  Bowel sounds normal  No masses or hepatosplenomegaly  No hernias present   Back: No CVA tenderness   Extremities: Extremities normal, atraumatic  No cyanosis or edema  No DVT signs   Pulses 2+ and symmetric all extremities   Skin: No rashes or ulcers    Musculo-      skeletal: Gait not tested  Normal symmetry, ROM, strength and tone   Neuro: Normal cranial nerves  Normal reflexes and sensation   Psych: Alert, oriented x3  Normal affect, judgement and insight         Data Review    EKG:  normal EKG, normal sinus rhythm, unchanged from previous tracings.    24 Hour Results:  Recent Results (from the past 24 hour(s))   EKG, 12 LEAD, INITIAL    Collection Time: 09/17/14 12:39 PM   Result Value Ref Range    Ventricular Rate 89 BPM    Atrial Rate 89 BPM    P-R Interval 136 ms    QRS Duration 82 ms    Q-T Interval 402 ms    QTC Calculation (Bezet) 489 ms    Calculated P Axis 56 degrees    Calculated R Axis 58 degrees    Calculated T Axis 25 degrees  Diagnosis Normal sinus rhythm  No previous ECGs available      CBC WITH AUTOMATED DIFF    Collection Time: 09/17/14  1:08 PM   Result Value Ref Range    WBC 8.9 3.6 - 11.0 K/uL    RBC 4.83 3.80 - 5.20 M/uL    HGB 13.6 11.5 - 16.0 g/dL    HCT 41.0 35.0 - 47.0 %    MCV 84.9 80.0 - 99.0 FL    MCH 28.2 26.0 - 34.0 PG    MCHC 33.2 30.0 - 36.5 g/dL    RDW 13.0 11.5 - 14.5 %    PLATELET 326 150 - 400 K/uL    NEUTROPHILS 62 32 - 75 %    LYMPHOCYTES 29 12 - 49 %    MONOCYTES 6 5 - 13 %    EOSINOPHILS 3 0 - 7 %    BASOPHILS 0 0 - 1 %    ABS. NEUTROPHILS 5.5 1.8 - 8.0 K/UL    ABS. LYMPHOCYTES 2.6 0.8 - 3.5 K/UL    ABS. MONOCYTES 0.5 0.0 - 1.0 K/UL    ABS. EOSINOPHILS 0.3 0.0 - 0.4 K/UL    ABS. BASOPHILS 0.0 0.0 - 0.1 K/UL   METABOLIC PANEL, COMPREHENSIVE    Collection Time: 09/17/14  1:08 PM   Result Value Ref Range    Sodium 140 136 - 145 mmol/L    Potassium 3.6 3.5 - 5.1 mmol/L    Chloride 105 97 - 108 mmol/L    CO2 25 21 - 32 mmol/L    Anion gap 10 5 - 15 mmol/L    Glucose 95 65 - 100 mg/dL    BUN 12 6 - 20 MG/DL    Creatinine 0.75 0.55 - 1.02 MG/DL    BUN/Creatinine ratio 16 12 - 20      GFR est AA >60 >60 ml/min/1.28m    GFR est non-AA >60 >60 ml/min/1.766m   Calcium 9.3 8.5 - 10.1 MG/DL    Bilirubin, total 0.3 0.2 - 1.0 MG/DL     ALT 56 12 - 78 U/L    AST 27 15 - 37 U/L    Alk. phosphatase 98 45 - 117 U/L    Protein, total 7.2 6.4 - 8.2 g/dL    Albumin 4.1 3.5 - 5.0 g/dL    Globulin 3.1 2.0 - 4.0 g/dL    A-G Ratio 1.3 1.1 - 2.2     TROPONIN I    Collection Time: 09/17/14  1:08 PM   Result Value Ref Range    Troponin-I, Qt. <0.04 <0.05 ng/mL   D DIMER    Collection Time: 09/17/14  1:08 PM   Result Value Ref Range    D-dimer 0.30 0.00 - 0.65 mg/L FEU     Recent Labs      09/17/14   1308   WBC  8.9   HGB  13.6   HCT  41.0   PLT  326     Recent Labs      09/17/14   1308   NA  140   K  3.6   CL  105   CO2  25   GLU  95   BUN  12   CREA  0.75   CA  9.3   ALB  4.1   TBILI  0.3   SGOT  27   ALT  56       Imaging  Exam: 2 view chest  ??  Indication:  Chest pain and shortness of breath left-sided beginning today.  ??  COMPARISON: 09/04/2010  ??  PA and lateral views demonstrate normal heart size. The patient is on a cardiac  monitor. There is no acute process in the lung fields. The osseous structures  reveal degenerative changes of the thoracic spine of mild to moderate degree.  ??  ??  IMPRESSION  Impression: No acute process.  ??      Assessment and Plan   1. Chest pain with shortness of breath  -admit to observation  -consult cardiology follows Dr Marlon Pel OP  -monitor on telemetry  -check d-dimer, tsh, Hgb a1c  -initial troponin negative, trend troponin  -start ASA   -check echo    Type 2 Diabetes   -continue home Metformin    Hypothyroidism   -continue home supplement  -check tsh    Hypertension  -not current on home BP meds  -will continue to monitor    Diet: Cardiac  Activity: as tolerated  Consultations: cardiology  Anticipated disposition: Appropriate for COU observation       Signed by: Sherlene Shams, NP    September 17, 2014 at 3:39 PM         ADDENDUM: I saw and evaluated the patient independently and I agree with the note by Ms. Ned Card, NP.    Carron Brazen, MD

## 2014-09-17 NOTE — ED Notes (Signed)
TRANSFER - OUT REPORT:    Verbal report given to Nunzio Cory, RN(name) on Katrina [name]  being transferred to [phone] (unit) for routine progression of care       Report consisted of patient???s Situation, Background, Assessment and   Recommendations(SBAR).     Information from the following report(s) SBAR, Kardex, ED Summary, Intake/Output, MAR, Recent Results and Cardiac Rhythm NSR was reviewed with the receiving nurse.    Lines:   Peripheral IV 09/17/14 Right Antecubital (Active)   Site Assessment Clean, dry, & intact 09/17/2014  1:16 PM   Phlebitis Assessment 0 09/17/2014  1:16 PM   Infiltration Assessment 0 09/17/2014  1:16 PM   Dressing Status Clean, dry, & intact 09/17/2014  1:16 PM   Dressing Type Tape;Transparent 09/17/2014  1:16 PM   Hub Color/Line Status Pink;Capped;Flushed;Patent 09/17/2014  1:16 PM   Action Taken Blood drawn 09/17/2014  1:16 PM        Opportunity for questions and clarification was provided.

## 2014-09-17 NOTE — Progress Notes (Signed)
Admission Medication Reconciliation:    Information obtained from: patient, rx query    Significant PMH/Disease States:   Past Medical History   Diagnosis Date   ??? Abnormal gall bladder diagnostic imaging 07/2000     Abnormal HIDA.  Dr. Tomasita Morrow.   ??? Adrenal adenoma 06/2009     Dr. Mcneil Sober, nephro.  Dr. Boston Service, surgeon.   ??? 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  Dr. Gerilyn Nestle.   ??? Chest pain of uncertain etiology 45/04/979     Dr. Sinclair Ship.  Normal Stress Echo 12/26/09.   ??? Chronic insomnia 2009, 2011     Dr. Dineen Kid.   ??? Dyslipidemia May 02, 2007     Improving with weight loss   ??? Essential hypertension 05/02/95     Dr. Mcneil Sober, nephro.  Dr. Eli Phillips, ophth.   ??? Family history of breast cancer in mother 37     MRI, 6 mo.  Dr. Gerre Pebbles   ??? Fatty liver 07/25/2007     with elevated AST, ALT.  In NASH study at [facility] (Dr. Hilary Hertz)   ??? Heartburn 07/25/2007     Has improved with weight loss   ??? 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 Major depression, recurrent ([place]) 05/02/90, May 02, 1995, 2007/05/02, 05/2013     situational.  fiance's death (05/02/95). Clydell Hakim, counselor.    ??? Obesity 07/25/2007     Improved with Lifestyle Modifications   ??? OSA (obstructive sleep apnea) 11/2010     Mild.  Txd CPAP 8 cm H20.  Dr. Bretta Bang.  stopped 11/2011.   ??? Osteoarthritis of lumbar spine      MRI 12/14   ??? Osteoarthritis of thoracic spine      MRI 12/14, Stable   ??? Type 2 diabetes, diet controlled ([place]) 05-02-03   ??? Vitamin D deficiency 05/2010       Chief Complaint for this Admission:  chest pain, SOB    Allergies:  Aldactone [spironolactone]; Codeine; and Paxil [paroxetine hcl]    Prior to Admission Medications:   Prior to Admission Medications   Prescriptions Last Dose Informant Patient Reported? Taking?   ascorbic acid (VITAMIN C) 500 mg tablet 09/16/2014 at 2200  Yes Yes   Sig: Take 500 mg by mouth daily.   b complex vitamins tablet 09/16/2014 at 2200  Yes Yes   Sig: Take 1 Tab by mouth daily.      cholecalciferol, vitamin D3, (VITAMIN D3) 2,000 unit tab 09/16/2014 at 2200  Yes Yes   Sig: Take 4,000 Units by mouth daily.   levothyroxine (SYNTHROID) 88 mcg tablet 09/17/2014 at 0800  No Yes   Sig: TAKE 1 TABLET DAILY BEFORE BREAKFAST FOR HYPOTHYROIDISM   metFORMIN ER (GLUCOPHAGE XR) 500 mg tablet 09/16/2014 at 2200  No Yes   Sig: Take 2 Tabs by mouth Before breakfast and dinner. Indications: TYPE 2 DIABETES MELLITUS   sertraline (ZOLOFT) 100 mg tablet 09/17/2014 at 0800  No Yes   Sig: Take 1 Tab by mouth daily. Indications: MAJOR DEPRESSIVE DISORDER   vitamin E (AQUA GEMS) 400 unit capsule 09/16/2014 at 2200  Yes Yes   Sig: Take 400 Units by mouth daily.      Facility-Administered Medications: None         Comments/Recommendations: I reviewed PTA medications with the patient and made the following changes:  1) D/C flonase, ativan, nystatin ointment  2) added dates/times of last doses    Rolla Etienne,  Pharm.D. Candidate 2017

## 2014-09-18 LAB — METABOLIC PANEL, COMPREHENSIVE
A-G Ratio: 1.2 (ref 1.1–2.2)
ALT (SGPT): 56 U/L (ref 12–78)
AST (SGOT): 26 U/L (ref 15–37)
Albumin: 3.7 g/dL (ref 3.5–5.0)
Alk. phosphatase: 72 U/L (ref 45–117)
Anion gap: 8 mmol/L (ref 5–15)
BUN/Creatinine ratio: 15 (ref 12–20)
BUN: 10 MG/DL (ref 6–20)
Bilirubin, total: 0.3 MG/DL (ref 0.2–1.0)
CO2: 23 mmol/L (ref 21–32)
Calcium: 9.2 MG/DL (ref 8.5–10.1)
Chloride: 107 mmol/L (ref 97–108)
Creatinine: 0.65 MG/DL (ref 0.55–1.02)
GFR est AA: >60 mL/min/{1.73_m2} (ref >60)
GFR est non-AA: >60 mL/min/{1.73_m2} (ref >60)
Globulin: 3 g/dL (ref 2.0–4.0)
Glucose: 108 mg/dL — ABNORMAL HIGH (ref 65–100)
Potassium: 4 mmol/L (ref 3.5–5.1)
Protein, total: 6.7 g/dL (ref 6.4–8.2)
Sodium: 138 mmol/L (ref 136–145)

## 2014-09-18 LAB — GLUCOSE, POC
Glucose (POC): 86 mg/dL (ref 65–100)
Glucose (POC): 114 mg/dL — ABNORMAL HIGH (ref 65–100)
Glucose (POC): 91 mg/dL (ref 65–100)

## 2014-09-18 LAB — LIPID PANEL
CHOL/HDL Ratio: 4.5 (ref 0–5.0)
Cholesterol, total: 194 MG/DL (ref <200)
HDL Cholesterol: 43 MG/DL
LDL, calculated: 126.4 MG/DL — ABNORMAL HIGH (ref 0–100)
Triglyceride: 123 MG/DL (ref <150)
VLDL, calculated: 24.6 MG/DL

## 2014-09-18 LAB — TROPONIN I: Troponin-I, Qt.: <0.04 ng/mL (ref <0.05)

## 2014-09-18 LAB — TSH 3RD GENERATION: TSH: 1.31 u[IU]/mL (ref 0.36–3.74)

## 2014-09-18 MED ORDER — METFORMIN SR 500 MG 24 HR TABLET
500 mg | Freq: Two times a day (BID) | ORAL | Status: DC
Start: 2014-09-18 — End: 2014-09-18

## 2014-09-18 MED ORDER — ASPIRIN 81 MG CHEWABLE TAB
81 mg | Freq: Every day | ORAL | Status: DC
Start: 2014-09-18 — End: 2014-09-18
  Administered 2014-09-18: 16:00:00 via ORAL

## 2014-09-18 MED ORDER — ASCORBIC ACID 500 MG TAB
500 mg | Freq: Every day | ORAL | Status: DC
Start: 2014-09-18 — End: 2014-09-18
  Administered 2014-09-18: 16:00:00 via ORAL

## 2014-09-18 MED ORDER — CHOLECALCIFEROL (VITAMIN D3) 1,000 UNIT (25 MCG) TAB
Freq: Every day | ORAL | Status: DC
Start: 2014-09-18 — End: 2014-09-18
  Administered 2014-09-18: 16:00:00 via ORAL

## 2014-09-18 MED ORDER — LEVOTHYROXINE 88 MCG TAB
88 mcg | Freq: Every day | ORAL | Status: DC
Start: 2014-09-18 — End: 2014-09-18
  Administered 2014-09-18: 16:00:00 via ORAL

## 2014-09-18 MED ORDER — VITAMIN E (DL, ACETATE) 400 UNIT CAP
180 mg (400 unit) | Freq: Every day | ORAL | Status: DC
Start: 2014-09-18 — End: 2014-09-18
  Administered 2014-09-18: 16:00:00 via ORAL

## 2014-09-18 MED ORDER — SERTRALINE 50 MG TAB
50 mg | Freq: Every day | ORAL | Status: DC
Start: 2014-09-18 — End: 2014-09-18
  Administered 2014-09-18: 16:00:00 via ORAL

## 2014-09-18 MED ORDER — ASPIRIN 81 MG CHEWABLE TAB
81 mg | ORAL_TABLET | Freq: Every day | ORAL | 0 refills | Status: AC
Start: 2014-09-18 — End: 2014-10-18

## 2014-09-18 MED FILL — SYNTHROID 88 MCG TABLET: 88 mcg | ORAL | Qty: 1

## 2014-09-18 MED FILL — VITAMIN D3 25 MCG (1,000 UNIT) TABLET: 25 mcg (1,000 unit) | ORAL | Qty: 4

## 2014-09-18 MED FILL — BD POSIFLUSH NORMAL SALINE 0.9 % INJECTION SYRINGE: INTRAMUSCULAR | Qty: 10

## 2014-09-18 MED FILL — BAYER CHEWABLE LOW DOSE ASPIRIN 81 MG TABLET: 81 mg | ORAL | Qty: 1

## 2014-09-18 MED FILL — VITAMIN E (DL, ACETATE) 400 UNIT CAP: 180 mg (400 unit) | ORAL | Qty: 1

## 2014-09-18 MED FILL — VITAMIN C 500 MG TABLET: 500 mg | ORAL | Qty: 1

## 2014-09-18 MED FILL — SERTRALINE 50 MG TAB: 50 mg | ORAL | Qty: 2

## 2014-09-18 NOTE — Discharge Summary (Signed)
Discharge Summary       PATIENT ID: Katrina [name]  MRN: [id number]   DATE OF BIRTH: 03-May-1965    DATE OF ADMISSION: 09/17/2014 12:50 PM    DATE OF DISCHARGE: 09/18/2014   PRIMARY CARE PROVIDER: Talbert Cage, DO     ATTENDING PHYSICIAN: Dr Burnadette Peter  DISCHARGING PROVIDER: Burnadette Peter, MD    To contact this individual call [phone] and ask the operator to page.  If unavailable ask to be transferred the Adult Hospitalist Department.    CONSULTATIONS: IP CONSULT TO HOSPITALIST  IP CONSULT TO CARDIOLOGY    PROCEDURES/SURGERIES: * No surgery found *    ADMITTING DIAGNOSES & HOSPITAL COURSE:   49 y.o. female with past medical history of HTN, Hyperlipidemia, Hypothyroidism, and Type 2 Diabetes who presents with chest pain.     Chest pain  -Appreciate cardiology input  -The patient to get echo and if normal will discharge the patient as per cardiology rec. Outpatient stress echo.    HTN  -now much better, stable    Hyperlipidemia  -stable    Hypothyroidism  -On synthroid  -TSH normal    Dm type 2  -SSI/metformin    Diabetic diet  FULL CODE          DISCHARGE DIAGNOSES / PLAN:      1.  Chest pain       PENDING TEST RESULTS:   At the time of discharge the following test results are still pending: none    FOLLOW UP APPOINTMENTS:    Follow-up Information     Follow up With Details Comments Contact Info    Talbert Cage, DO In 1 week  [address]  [address]  [facility]  [place] [address]  [phone]      Madolyn Frieze, MD On 09/20/2014 stress echo [address]  [place] [address]  [phone]             ADDITIONAL CARE RECOMMENDATIONS: none    DIET: Cardiac Diet    ACTIVITY: Activity as tolerated      DISCHARGE MEDICATIONS:  Current Discharge Medication List      START taking these medications    Details   aspirin 81 mg chewable tablet Take 1 Tab by mouth daily for 30 days.  Qty: 30 Tab, Refills: 0         CONTINUE these medications which have NOT CHANGED     Details   levothyroxine (SYNTHROID) 88 mcg tablet TAKE 1 TABLET DAILY BEFORE BREAKFAST FOR HYPOTHYROIDISM  Qty: 90 Tab, Refills: 0      sertraline (ZOLOFT) 100 mg tablet Take 1 Tab by mouth daily. Indications: MAJOR DEPRESSIVE DISORDER  Qty: 90 Tab, Refills: 3    Associated Diagnoses: Recurrent major depressive disorder, in remission (HCC)      cholecalciferol, vitamin D3, (VITAMIN D3) 2,000 unit tab Take 4,000 Units by mouth daily.      metFORMIN ER (GLUCOPHAGE XR) 500 mg tablet Take 2 Tabs by mouth Before breakfast and dinner. Indications: TYPE 2 DIABETES MELLITUS  Qty: 360 Tab, Refills: 3      b complex vitamins tablet Take 1 Tab by mouth daily.        ascorbic acid (VITAMIN C) 500 mg tablet Take 500 mg by mouth daily.      vitamin E (AQUA GEMS) 400 unit capsule Take 400 Units by mouth daily.  NOTIFY YOUR PHYSICIAN FOR ANY OF THE FOLLOWING:   Fever over 101 degrees for 24 hours.   Chest pain, shortness of breath, fever, chills, nausea, vomiting, diarrhea, change in mentation, falling, weakness, bleeding. Severe pain or pain not relieved by medications.  Or, any other signs or symptoms that you may have questions about.    DISPOSITION:  x  Home With:   OT  PT  HH  RN       Long term SNF/Inpatient Rehab    Independent/assisted living    Hospice    Other:       PATIENT CONDITION AT DISCHARGE:     Functional status    Poor     Deconditioned    x Independent      Cognition   x  Lucid     Forgetful     Dementia      Catheters/lines (plus indication)    Foley     PICC     PEG    x None      Code status   x  Full code     DNR      PHYSICAL EXAMINATION AT DISCHARGE:  Please see progress note      CHRONIC MEDICAL DIAGNOSES:  Problem List as of 09/18/2014  Date Reviewed: 10-16-14          Codes Class Noted - Resolved    * (Principal)Chest pain ICD-10-CM: R07.9  ICD-9-CM: 786.50  2014/10/16 - Present        Advanced care planning/counseling discussion ICD-10-CM: Z71.89  ICD-9-CM: V65.49  04/28/2014 - Present         Inadequate sleep hygiene ICD-10-CM: Z72.821  ICD-9-CM: 307.49  04/28/2014 - Present    Overview Signed 04/28/2014  1:41 PM by Talbert Cage, DO     intermittent based on school work, improved             Subconjunctival hemorrhage ICD-10-CM: H11.30  ICD-9-CM: 372.72  04/28/2014 - Present    Overview Signed 04/28/2014  1:47 PM by Talbert Cage, DO     recurrent             Obesity, Class I, BMI 30-34.9 ICD-10-CM: E66.9  ICD-9-CM: 278.00  02/26/2014 - Present        Dyslipidemia ICD-10-CM: E78.5  ICD-9-CM: 272.4  Unknown - Present    Overview Signed 11/09/2013 11:10 AM by Talbert Cage, DO     Improving with weight loss             Major depression, recurrent ([place]) ICD-10-CM: F33.9  ICD-9-CM: 296.30  Unknown - Present    Overview Signed 11/09/2013 11:10 AM by Talbert Cage, DO     situational.  fiance's death 05-08-1995).               Essential hypertension ICD-10-CM: I10  ICD-9-CM: 401.9  Unknown - Present    Overview Signed 11/09/2013 11:10 AM by Talbert Cage, DO     Well controlled with diet and exercise             Family history of breast cancer in mother ICD-10-CM: Z80.3  ICD-9-CM: V16.3  Unknown - Present    Overview Signed 11/09/2013 11:10 AM by Talbert Cage, DO     MRI, 6 mo.  Dr. Gerre Pebbles             Type 2 diabetes, diet controlled ([place]) ICD-10-CM: E11.9  ICD-9-CM: 250.00  Unknown - Present  Vitamin D deficiency ICD-10-CM: E55.9  ICD-9-CM: 268.9  06/15/2013 - Present        Hamstring tightness ICD-10-CM: M62.89  ICD-9-CM: 728.9  01/26/2013 - Present        Overweight (BMI 25.0-29.9) ICD-10-CM: E66.3  ICD-9-CM: 278.02  12/25/2011 - Present        Thoracic spondylosis ICD-10-CM: M47.814  ICD-9-CM: 721.2  03/28/2011 - Present    Overview Signed 03/10/2013 11:36 AM by Dyke Brackett     Last MRI 12/2012, Stable             OSA (obstructive sleep apnea) ICD-10-CM: G47.33  ICD-9-CM: 327.23  03/09/2011 - Present    Overview Signed 11/09/2013 10:55 AM by Talbert Cage, DO      Txd CPAP 8 cm H20.  Dr. Bretta Bang.             Abnormal EKG ICD-10-CM: R94.31  ICD-9-CM: 794.31  12/12/2009 - Present        GE reflux ICD-10-CM: K21.9  ICD-9-CM: 530.81  07/25/2007 - Present        Fatty liver ICD-10-CM: K76.0  ICD-9-CM: 571.8  07/25/2007 - Present        Hypothyroidism due to acquired atrophy of thyroid ICD-10-CM: E03.8, E03.4  ICD-9-CM: 244.8, 246.8  07/25/2007 - Present        RESOLVED: Chronic insomnia ICD-10-CM: F51.04  ICD-9-CM: 780.52  Unknown - 04/28/2014    Overview Signed 11/09/2013 10:59 AM by Talbert Cage, DO     Dr. Dineen Kid.             RESOLVED: Idiopathic hypothyroidism ICD-10-CM: E03.9  ICD-9-CM: 244.9  05/06/2013 - 11/09/2013        RESOLVED: Sleep apnea ICD-10-CM: G47.30  ICD-9-CM: 780.57  12/22/2010 - 11/09/2013    Overview Signed 03/10/2013 11:35 AM by [place] with weight loss             RESOLVED: Chest pain, unspecified ICD-10-CM: R07.9  ICD-9-CM: 786.50  12/12/2009 - 10/24/2010        RESOLVED: Adrenal adenoma ICD-10-CM: D35.00  ICD-9-CM: 227.0  12/12/2009 - 10/24/2010        RESOLVED: Hypotension (arterial) ICD-10-CM: I95.9  ICD-9-CM: 458.9  12/12/2009 - 10/24/2010        RESOLVED: Hypokalemia ICD-10-CM: E87.6  ICD-9-CM: 276.8  07/13/2008 - 03/10/2013        RESOLVED: Essential hypertension, benign ICD-10-CM: I10  ICD-9-CM: 401.1  03/19/2008 - 12/25/2011        RESOLVED: Diabetes mellitus ([place]) ICD-10-CM: E11.9  ICD-9-CM: 250.00  12/19/2007 - 12/25/2011        RESOLVED: S/P tonsillectomy ICD-10-CM: Z90.89  ICD-9-CM: V45.89  07/25/2007 - 10/24/2010        RESOLVED: S/P appendectomy ICD-10-CM: Z90.49  ICD-9-CM: V45.89  07/25/2007 - 10/24/2010        RESOLVED: Obesity ICD-10-CM: E66.9  ICD-9-CM: 278.00  07/25/2007 - 12/25/2011        RESOLVED: HTN ICD-9-CM: 401.9  07/25/2007 - 03/19/2008        RESOLVED: Hyperlipidemia ICD-10-CM: E78.5  ICD-9-CM: 272.4  07/25/2007 - 11/09/2013    Overview Signed 03/10/2013 11:35 AM by Dyke Brackett      Improving with weight loss. Last HDL lab work done 10/2012             RESOLVED: Hypothyroidism ICD-10-CM: E03.9  ICD-9-CM: 244.9  07/25/2007 - 11/09/2013    Overview Signed 03/10/2013 11:35 AM by Dyke Brackett  Last TSH 0.89 on 10/2012             RESOLVED: Depression ICD-10-CM: F32.9  ICD-9-CM: 311  07/25/2007 - 11/09/2013        RESOLVED: Elevated triglycerides with high cholesterol ICD-10-CM: E78.2  ICD-9-CM: 272.2  07/25/2007 - 05/30/2010              Greater than 25 minutes were spent with the patient on counseling and coordination of care    Signed:   Burnadette Peter, MD  09/18/2014  11:29 AM

## 2014-09-18 NOTE — Progress Notes (Signed)
I have reviewed discharge instructions with the patient.  The patient verbalized understanding.

## 2014-09-18 NOTE — Progress Notes (Signed)
TTE Completed Results to follow

## 2014-09-18 NOTE — Progress Notes (Signed)
Hospitalist Progress Note   Burnadette Peter, MD   After 7pm call hospitalist on call through the operator      09/18/2014   PCP:  Dr. Talbert Cage, DO    Assessment/Plan   49 y.o. female with past medical history of HTN, Hyperlipidemia, Hypothyroidism, and Type 2 Diabetes who presents with chest pain.     Chest pain  -Appreciate cardiology input  -The patient to get echo and if normal will discharge the patient as per cardiology rec. Outpatient stress echo.    HTN  -now much better, stable    Hyperlipidemia  -stable    Hypothyroidism  -On synthroid  -TSH normal    Dm type 2  -SSI/metformin    Diabetic diet  FULL CODE    Plan: Await echo, if normal, discharge  See orders for other plans.  VTE prophylaxis: scd  Discussed plan of care with Patient/Family   Discharge planning: as abvoe     Subjective   No new issues   Reviewed interval history    Physical examination     Visit Vitals   ??? BP 129/76 (BP 1 Location: Left arm, BP Patient Position: At rest)   ??? Pulse 63   ??? Temp 98.1 ??F (36.7 ??C)   ??? Resp 16   ??? LMP 09/01/2014   ??? SpO2 97%       Temp (24hrs), Avg:98.3 ??F (36.8 ??C), Min:97.7 ??F (36.5 ??C), Max:98.9 ??F (37.2 ??C)      Intake/Output Summary (Last 24 hours) at 09/18/14 1008  Last data filed at 09/18/14 0930   Gross per 24 hour   Intake    480 ml   Output      0 ml   Net    480 ml       General:   Alert, cooperative, no acute distress   Head:   Atraumatic   Eyes:   Conjunctivae clear   ENMT:  Oral mucosa normal   Neck:  Supple, trachea midline, no adenopathy   No JVD   Back:    No CVA tenderness    Chest wall:    No tenderness or deformities    Lungs:   Clear to auscultation bilaterally    Heart:   Regular rhythm, no murmur   Abdomen:    Soft, non-tender   No masses or organomegaly   Extremities:  No edema or DVT signs   Pulses:  Symmetric all extremities   Skin:  Warm and dry    No rashes or lesions   Neurologic:  Oriented   No focal deficits                         Data Review   Reviewed   Telemetry    I have reviewed the flow sheet and recent notes  New labs and data below personally reviewed.    Recent Labs      09/17/14   1308   WBC  8.9   HGB  13.6   HCT  41.0   PLT  326     Recent Labs      09/18/14   0535  09/17/14   1308   NA  138  140   K  4.0  3.6   CL  107  105   CO2  23  25   GLU  108*  95   BUN  10  12   CREA  0.65  0.75   CA  9.2  9.3   ALB  3.7  4.1   SGOT  26  27   ALT  56  56       Medications reviewed  Current Facility-Administered Medications   Medication Dose Route Frequency   ??? aspirin chewable tablet 81 mg  81 mg Oral DAILY   ??? sodium chloride (NS) flush 5-10 mL  5-10 mL IntraVENous Q8H   ??? sodium chloride (NS) flush 5-10 mL  5-10 mL IntraVENous PRN   ??? glucose chewable tablet 16 g  4 Tab Oral PRN   ??? dextrose (D50W) injection syrg 12.5-25 g  12.5-25 g IntraVENous PRN   ??? glucagon (GLUCAGEN) injection 1 mg  1 mg IntraMUSCular PRN   ??? insulin lispro (HUMALOG) injection   SubCUTAneous AC&HS         Burnadette Peter, MD  Internal Medicine  09/18/2014

## 2014-09-18 NOTE — Consults (Signed)
Date of  Admission: 09/17/2014 12:50 PM     Katrina [name] is a 49 y.o. female admitted for Chest pain  Subjective: patient with h/o HTN, HLD and aodm, stopped taking bp meds in 2011 after removal of adrenal adenoma. She has a FH significant for early CAD presented yesterday with left sided chest pain radiating to shoulder and some sob. Sob apparently noticeable in the last several months. Cp occurred once she got home from the gym where she exercised for 30 minutes on TM with no problems    reports chest pressure/discomfort.  Cardiac risk factors: family history, dyslipidemia, diabetes mellitus, hypertension.    Assessment/Plan: she has ruled out for MI with serial troponin despite prolonged episode of CP (>2 hours in duration continuously) her ddimer is normal  Her BP was elevated upon admission and now back to normal  Her ECG shows NSR and nstt but no acute ischemic changes  Unclear etiology of symptoms at this time as I have explained to patient,   Multiple risk factors suggest the need for further evaluation in my opinion  Proceed with echo if normal may consider dc home with  Outpatient stress echo Monday or Tuesday at the latest assuming the patient has not recurrent chest discomfort while in hospital or when dc in which case she is fully aware of the need to return to er immediately  Start asa for now      Patient Active Problem List    Diagnosis Date Noted   ??? Chest pain 09/17/2014   ??? Advanced care planning/counseling discussion 04/28/2014   ??? Inadequate sleep hygiene 04/28/2014   ??? Subconjunctival hemorrhage 04/28/2014   ??? Obesity, Class I, BMI 30-34.9 02/26/2014   ??? Dyslipidemia    ??? Major depression, recurrent ([place])    ??? Essential hypertension    ??? Family history of breast cancer in mother    ??? Type 2 diabetes, diet controlled ([place])    ??? Vitamin D deficiency 06/15/2013   ??? Hamstring tightness 01/26/2013   ??? Overweight (BMI 25.0-29.9) 12/25/2011   ??? Thoracic spondylosis 03/28/2011    ??? OSA (obstructive sleep apnea) 03/09/2011   ??? Abnormal EKG 12/12/2009   ??? GE reflux 07/25/2007   ??? Fatty liver 07/25/2007   ??? Hypothyroidism due to acquired atrophy of thyroid 07/25/2007      Talbert Cage, DO  Past Medical History   Diagnosis Date   ??? Abnormal gall bladder diagnostic imaging 07/2000     Abnormal HIDA.  Dr. Tomasita Morrow.   ??? Adrenal adenoma 06/2009     Dr. Mcneil Sober, nephro.  Dr. Boston Service, surgeon.   ??? 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  Dr. Gerilyn Nestle.   ??? Chest pain of uncertain etiology 66/05/9933     Dr. Sinclair Ship.  Normal Stress Echo 12/26/09.   ??? Chronic insomnia 2009, 2011     Dr. Dineen Kid.   ??? Dyslipidemia 2009     Improving with weight loss   ??? Essential hypertension 1997     Dr. Mcneil Sober, nephro.  Dr. Eli Phillips, ophth.   ??? Family history of breast cancer in mother 48     MRI, 6 mo.  Dr. Gerre Pebbles   ??? Fatty liver 07/25/2007     with elevated AST, ALT.  In NASH study at [facility] (Dr. Hilary Hertz)   ??? Heartburn 07/25/2007     Has improved with weight loss   ??? Hypothyroidism due to acquired atrophy  of thyroid 07/25/2007   ??? Major depression, recurrent ([place]) 03/01/90, 03-02-1995, 03/02/2007, 05/2013     situational.  fiance's death 1995-03-02). Clydell Hakim, counselor.    ??? Obesity 07/25/2007     Improved with Lifestyle Modifications   ??? OSA (obstructive sleep apnea) 11/2010     Mild.  Txd CPAP 8 cm H20.  Dr. Bretta Bang.  stopped 11/2011.   ??? Osteoarthritis of lumbar spine      MRI 12/14   ??? Osteoarthritis of thoracic spine      MRI 12/14, Stable   ??? Type 2 diabetes, diet controlled ([place]) 03/02/2003   ??? Vitamin D deficiency 05/2010      Past Surgical History   Procedure Laterality Date   ??? Pr appendectomy  07/2000     Dr. Tomasita Morrow.   ??? Hx tonsillectomy  03-01-1972   ??? Hx breast reduction  1989     Bilaterally.  Dr. Ashby Dawes.    ??? Hx cholecystectomy  07/2000     Gallbladder malfunctioning.  Dr. Tomasita Morrow.   ??? Hx other surgical Right 08/01/09      LAP ADRENALECTOMY.  due to benign adrenal adenoma/tumor.  Dr. Boston Service.   ??? Hx other surgical  2008, 2013     LIVER BIOPSY X 2.       Allergies   Allergen Reactions   ??? Aldactone [Spironolactone] Swelling   ??? Codeine Rash and Itching   ??? Paxil [Paroxetine Hcl] Other (comments)     WORSENED DEPRESSION.      Family History   Problem Relation Age of Onset   ??? Hypertension Mother    ??? Breast Cancer Mother 33   ??? Stroke Mother 94   ??? Anxiety Mother    ??? Depression Mother    ??? Diabetes Father    ??? Hypertension Father    ??? Kidney Disease Father      reversed   ??? High Cholesterol Father    ??? Stroke Sister 21     Carotid Artery Dissection   ??? Other Sister      psedotumor cerebri   ??? Diabetes Paternal Grandmother    ??? Hypertension Paternal Grandmother    ??? Cancer Paternal Grandmother      ?LEUKEMIA   ??? Hypertension Paternal Grandfather    ??? High Cholesterol Paternal Grandfather    ??? Diabetes Maternal Grandmother    ??? Hypertension Maternal Grandmother    ??? Diabetes Maternal Grandfather    ??? Hypertension Maternal Grandfather    ??? Cancer Paternal Uncle      PANCREATIC   ??? Cancer Maternal Uncle      PANCREATIC      Current Facility-Administered Medications   Medication Dose Route Frequency   ??? sodium chloride (NS) flush 5-10 mL  5-10 mL IntraVENous Q8H   ??? sodium chloride (NS) flush 5-10 mL  5-10 mL IntraVENous PRN   ??? glucose chewable tablet 16 g  4 Tab Oral PRN   ??? dextrose (D50W) injection syrg 12.5-25 g  12.5-25 g IntraVENous PRN   ??? glucagon (GLUCAGEN) injection 1 mg  1 mg IntraMUSCular PRN   ??? insulin lispro (HUMALOG) injection   SubCUTAneous AC&HS         Review of Symptoms:  Pertinent items are noted in HPI.    Physical Exam    Visit Vitals   ??? BP 129/76 (BP 1 Location: Left arm, BP Patient Position: At rest)   ??? Pulse 63   ??? Temp 98.1 ??  F (36.7 ??C)   ??? Resp 16   ??? LMP 09/01/2014   ??? SpO2 97%     Visit Vitals   ??? BP 129/76 (BP 1 Location: Left arm, BP Patient Position: At rest)   ??? Pulse 63   ??? Temp 98.1 ??F (36.7 ??C)    ??? Resp 16   ??? LMP 09/01/2014   ??? SpO2 97%     General Appearance:  Well developed, well nourished,alert and oriented x 3, and individual in no acute distress.   Ears/Nose/Mouth/Throat:   Hearing grossly normal.         Neck: Supple.   Chest:   Lungs clear to auscultation bilaterally.   Cardiovascular:  Regular rate and rhythm, S1, S2 normal, no murmur.   Abdomen:   Soft, non-tender, bowel sounds are active.   Extremities: No edema bilaterally.    Skin: Warm and dry.               Cardiographics    Telemetry: normal sinus rhythm  ECG: normal sinus rhythm, nonspecific ST and T waves changes  Echocardiogram: Not done    Recent radiology, intake/output and wt reviewed    Labs:   Recent Results (from the past 24 hour(s))   EKG, 12 LEAD, INITIAL    Collection Time: 09/17/14 12:39 PM   Result Value Ref Range    Ventricular Rate 89 BPM    Atrial Rate 89 BPM    P-R Interval 136 ms    QRS Duration 82 ms    Q-T Interval 402 ms    QTC Calculation (Bezet) 489 ms    Calculated P Axis 56 degrees    Calculated R Axis 58 degrees    Calculated T Axis 25 degrees    Diagnosis       Normal sinus rhythm  No previous ECGs available  Confirmed by Daniel Nones, M.D., [place] [id number]) on 09/17/2014 5:25:27 PM     CBC WITH AUTOMATED DIFF    Collection Time: 09/17/14  1:08 PM   Result Value Ref Range    WBC 8.9 3.6 - 11.0 K/uL    RBC 4.83 3.80 - 5.20 M/uL    HGB 13.6 11.5 - 16.0 g/dL    HCT 41.0 35.0 - 47.0 %    MCV 84.9 80.0 - 99.0 FL    MCH 28.2 26.0 - 34.0 PG    MCHC 33.2 30.0 - 36.5 g/dL    RDW 13.0 11.5 - 14.5 %    PLATELET 326 150 - 400 K/uL    NEUTROPHILS 62 32 - 75 %    LYMPHOCYTES 29 12 - 49 %    MONOCYTES 6 5 - 13 %    EOSINOPHILS 3 0 - 7 %    BASOPHILS 0 0 - 1 %    ABS. NEUTROPHILS 5.5 1.8 - 8.0 K/UL    ABS. LYMPHOCYTES 2.6 0.8 - 3.5 K/UL    ABS. MONOCYTES 0.5 0.0 - 1.0 K/UL    ABS. EOSINOPHILS 0.3 0.0 - 0.4 K/UL    ABS. BASOPHILS 0.0 0.0 - 0.1 K/UL   METABOLIC PANEL, COMPREHENSIVE    Collection Time: 09/17/14  1:08 PM    Result Value Ref Range    Sodium 140 136 - 145 mmol/L    Potassium 3.6 3.5 - 5.1 mmol/L    Chloride 105 97 - 108 mmol/L    CO2 25 21 - 32 mmol/L    Anion gap 10 5 - 15 mmol/L    Glucose 95  65 - 100 mg/dL    BUN 12 6 - 20 MG/DL    Creatinine 0.75 0.55 - 1.02 MG/DL    BUN/Creatinine ratio 16 12 - 20      GFR est AA >60 >60 ml/min/1.35m    GFR est non-AA >60 >60 ml/min/1.716m   Calcium 9.3 8.5 - 10.1 MG/DL    Bilirubin, total 0.3 0.2 - 1.0 MG/DL    ALT 56 12 - 78 U/L    AST 27 15 - 37 U/L    Alk. phosphatase 98 45 - 117 U/L    Protein, total 7.2 6.4 - 8.2 g/dL    Albumin 4.1 3.5 - 5.0 g/dL    Globulin 3.1 2.0 - 4.0 g/dL    A-G Ratio 1.3 1.1 - 2.2     TROPONIN I    Collection Time: 09/17/14  1:08 PM   Result Value Ref Range    Troponin-I, Qt. <0.04 <0.05 ng/mL   D DIMER    Collection Time: 09/17/14  1:08 PM   Result Value Ref Range    D-dimer 0.30 0.00 - 0.65 mg/L FEU   HEMOGLOBIN A1C WITH EAG    Collection Time: 09/17/14  1:08 PM   Result Value Ref Range    Hemoglobin A1c 5.9 4.2 - 6.3 %    Est. average glucose 123 mg/dL   GLUCOSE, POC    Collection Time: 09/17/14  4:43 PM   Result Value Ref Range    Glucose (POC) 83 65 - 100 mg/dL    Performed by BiNathaneil Canary  TROPONIN I    Collection Time: 09/17/14  6:19 PM   Result Value Ref Range    Troponin-I, Qt. <0.04 <0.05 ng/mL   GLUCOSE, POC    Collection Time: 09/17/14  9:30 PM   Result Value Ref Range    Glucose (POC) 86 65 - 100 mg/dL    Performed by GrGriffith Citron  TROPONIN I    Collection Time: 09/17/14 11:44 PM   Result Value Ref Range    Troponin-I, Qt. <0.04 <0.05 ng/mL   LIPID PANEL    Collection Time: 09/18/14  5:18 AM   Result Value Ref Range    LIPID PROFILE          Cholesterol, total 194 <200 MG/DL    Triglyceride 123 <150 MG/DL    HDL Cholesterol 43 MG/DL    LDL, calculated 126.4 (H) 0 - 100 MG/DL    VLDL, calculated 24.6 MG/DL    CHOL/HDL Ratio 4.5 0 - 5.0     TSH 3RD GENERATION    Collection Time: 09/18/14  5:24 AM   Result Value Ref Range     TSH 1.31 0.36 - 3.2.87IU/mL   METABOLIC PANEL, COMPREHENSIVE    Collection Time: 09/18/14  5:35 AM   Result Value Ref Range    Sodium 138 136 - 145 mmol/L    Potassium 4.0 3.5 - 5.1 mmol/L    Chloride 107 97 - 108 mmol/L    CO2 23 21 - 32 mmol/L    Anion gap 8 5 - 15 mmol/L    Glucose 108 (H) 65 - 100 mg/dL    BUN 10 6 - 20 MG/DL    Creatinine 0.65 0.55 - 1.02 MG/DL    BUN/Creatinine ratio 15 12 - 20      GFR est AA >60 >60 ml/min/1.7362m  GFR est non-AA >60 >60 ml/min/1.62m48m Calcium 9.2 8.5 -  10.1 MG/DL    Bilirubin, total 0.3 0.2 - 1.0 MG/DL    ALT 56 12 - 78 U/L    AST 26 15 - 37 U/L    Alk. phosphatase 72 45 - 117 U/L    Protein, total 6.7 6.4 - 8.2 g/dL    Albumin 3.7 3.5 - 5.0 g/dL    Globulin 3.0 2.0 - 4.0 g/dL    A-G Ratio 1.2 1.1 - 2.2     GLUCOSE, POC    Collection Time: 09/18/14  7:20 AM   Result Value Ref Range    Glucose (POC) 114 (H) 65 - 100 mg/dL    Performed by Griffith Citron

## 2014-09-20 NOTE — Progress Notes (Signed)
NNTOCIP    Initial NN Note       Hospital Discharge Follow-Up          Date/Time:  09/20/2014 11:37 AM    Patient listed on hospital discharge [facility]) report on 09/18/14. Patient hospitalized @ [address] [place] 09/17/14-09/18/14 (Observation) .      RRAT score: not available     NN post hospital interactive contact done by telephone within 2 business days of discharge.    Diagnosis:  Chest Pain, HTN, Hyperlipidemia, Hypothyroidism, DM Type 2     HPI (as per Hospitalist documentation):  Katrina [name] is a 49 y.o. female with past medical history of HTN, Hyperlipidemia, Hypothyroidism, and Type 2 Diabetes who presents with chest pain. Patient states this morning she walked on the treadmil for 30 minutes, came home and notice she was short of breath and had chest pressure/squeezing. Patient also notes pressure down her left arm. Patient states nothing makes the pressure better however activity makes it worse. Patient states the shortness of breath has been going on for the last few months. States that she has noticed the shortness of breath with going up stairs. Patient states she had put the shortness of breath off because she assumed it was due to not exercising and a poor diet. However she became concerned when the chest pain had started. Patient also notes nausea in the morning for the last few weeks. Patient notes she has a similar episode about 5 years ago. She states she went to see a Hydrologist and had a full work up with echo and stress and reports all were negative. Patient states she use to be on blood pressure and cholesterol medication but was able to come off the medication with diet and exercise changes. Patient also notes that she recently travelled 9 hours to [place] a few weeks ago. Patient denies any pain with inspiration, edema, dizziness, weakness, urinary symptoms, or any recent illnesses. Patient's sister has a h/o multiple DVTs with Factor 5 Deficiency. Initial  troponin and EKG normal in ER, will admit to observation for further work up.     Consults:  Cardiology- Madolyn Frieze, MD    Medical History:     Past Medical History   Diagnosis Date   ??? Abnormal gall bladder diagnostic imaging 07/2000     Abnormal HIDA.  Dr. Tomasita Morrow.   ??? Adrenal adenoma 06/2009     Dr. Mcneil Sober, nephro.  Dr. [facility], surgeon.   ??? 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  Dr. Gerilyn Nestle.   ??? Chest pain of uncertain etiology 84/01/6604     Dr. Sinclair Ship.  Normal Stress Echo 12/26/09.   ??? Chronic insomnia 2009, 2011     Dr. Dineen Kid.   ??? Dyslipidemia April 12, 2007     Improving with weight loss   ??? Essential hypertension 04/12/1995     Dr. Mcneil Sober, nephro.  Dr. Eli Phillips, ophth.   ??? Family history of breast cancer in mother 8     MRI, 6 mo.  Dr. Gerre Pebbles   ??? Fatty liver 07/25/2007     with elevated AST, ALT.  In NASH study at [facility] (Dr. Hilary Hertz)   ??? Heartburn 07/25/2007     Has improved with weight loss   ??? 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 Major depression, recurrent ([place]) 1990-04-12, 1995/04/12, April 12, 2007, 05/2013     situational.  fiance's death (April 12, 1995). Clydell Hakim,  counselor.    ??? Obesity 07/25/2007     Improved with Lifestyle Modifications   ??? OSA (obstructive sleep apnea) 11/2010     Mild.  Txd CPAP 8 cm H20.  Dr. Bretta Bang.  stopped 11/2011.   ??? Osteoarthritis of lumbar spine      MRI 12/14   ??? Osteoarthritis of thoracic spine      MRI 12/14, Stable   ??? Type 2 diabetes, diet controlled ([place]) 2005   ??? Vitamin D deficiency 05/2010       Nurse Navigator (NN) contacted the patient by telephone to perform post hospital discharge assessment. Verified pt name and DOB as identifiers. Provided introduction to self, and explanation of the Nurse Navigator role.      Diet:   Patient reported: Diabetic/Cardiac    Activity:   Patient reported: no exercise till Stress Echo done    Medications:   Performed medication reconciliation with patient, and patient verbalizes  understanding of administration of home medications. There were no barriers to obtaining medications identified at this time. Pt reported hasn't started ASA 81 mg yet. "I plan to buy it today & start taking it".     Support system:  Unknown    Discharge Instructions:  Reviewed discharge instructions with patient. Patient verbalized understanding of discharge instructions and follow-up care.     Red Flags:  Reviewed red flags with patient, and patient verbalizes understanding. Patient given an opportunity to ask questions.    Labs Reviewed:  Recent Labs      09/17/14   1308   WBC  8.9   HGB  13.6   HCT  41.0   PLT  326     Recent Labs      09/18/14   0535  09/17/14   1308   NA  138  140   K  4.0  3.6   CL  107  105   CO2  23  25   GLU  108*  95   BUN  10  12   CREA  0.65  0.75   CA  9.2  9.3   ALB  3.7  4.1   SGOT  26  27   ALT  56  56       Imaging results reviewed:  09/17/14 -Chest PA & Lat- IMPRESSION: No acute process.  09/18/14- Echo (TTE)- SUMMARY: Left ventricle: Systolic function was normal. Ejection fraction was estimated in the range of 60 % to 65 %. No obvious wall motion, abnormalities identified in the views obtained.    ??Advance Care Planning:   Does patient have an Advance Directive:  no   If no, did you provide information:   Yes  Pt stated "it's something I need to do".    PCP/Specialist follow up:   Schedule Outpt Stress Echo for 09/20/14. Schedule to follow up with [name], DO in 1 week      No other clinical/social/functional needs noted.     The patient agrees to contact the PCP office for questions related to her healthcare.      PLAN:  1. Pt to purchase ASA 81 mg & start taking today as prescribed     2. Stress Echo scheduled for 09/23/14 @ 1:00 @ [facility] (earliest appt available as reported by pt).  3. PCP post hosp TC f/u on 09/24/14 @ 10:15 AM  4. Pt to be given ACP/AMD handout on 916/16 during f/u visit-by provider nurse  Reviewed plan of care. Patient verbalized understanding and agreement with plan.     NN contact information given to call as needed.

## 2014-09-20 NOTE — Telephone Encounter (Signed)
I had lm on vm to schedule appt with Dr. Suzan Slick and have stress echo. Then I saw patient has already made appt to have stress echo at [facility]. I called her back and lm to disregard my last message

## 2014-09-21 NOTE — Telephone Encounter (Signed)
Agreed.

## 2014-09-23 ENCOUNTER — Institutional Professional Consult (permissible substitution): Admit: 2014-09-23 | Discharge: 2014-09-23 | Payer: PRIVATE HEALTH INSURANCE | Primary: Family Medicine

## 2014-09-23 ENCOUNTER — Encounter: Admit: 2014-09-23 | Discharge: 2014-09-23 | Payer: PRIVATE HEALTH INSURANCE | Primary: Family Medicine

## 2014-09-23 DIAGNOSIS — R079 Chest pain, unspecified: Secondary | ICD-10-CM

## 2014-09-23 NOTE — Progress Notes (Signed)
Stress echo completed.

## 2014-09-24 ENCOUNTER — Ambulatory Visit
Admit: 2014-09-24 | Discharge: 2014-09-24 | Payer: PRIVATE HEALTH INSURANCE | Attending: Family Medicine | Primary: Family Medicine

## 2014-09-24 DIAGNOSIS — E119 Type 2 diabetes mellitus without complications: Secondary | ICD-10-CM

## 2014-09-24 NOTE — Progress Notes (Signed)
Chief Complaint   Patient presents with   ??? Hospital Follow Up     9/9/16[facility]   ??? Chest Pain     1. Have you been to the ER, urgent care clinic since your last visit?  Hospitalized since your last visit?Yes When: 09/17/14 Where: [facility]-ER and observation    2. Have you seen or consulted any other health care providers outside of the [place] since your last visit?  Include any pap smears or colon screening. Yes When: 09/14/14 Where: Dr. Michelene Heady     ACP booklet given and discussed with patient.     The patient was counseled on the dangers of tobacco use, and was Patient doesn't smoke.  Reviewed strategies to maximize success, including Patient doesn't smoke.         Diabetic foot exam performed by Horton Chin, LPN     Measurement  Response Nurse Comment Physician Comment   Monofilament  R - normal sensation with micro filament  L - normal sensation with micro filament     Pulse DP R - present  L - present     Pulse TP R - present  L - present     Structural deformity R - None  L - None     Skin Integrity / Deformity R - None  L - None        Reviewed by:

## 2014-09-24 NOTE — Patient Instructions (Addendum)
Chest Pain: Care Instructions  Your Care Instructions  There are many things that can cause chest pain. Some are not serious and will get better on their own in a few days. But some kinds of chest pain need more testing and treatment. Your doctor may have recommended a follow-up visit in the next 8 to 12 hours. If you are not getting better, you may need more tests or treatment.  Even though your doctor has released you, you still need to watch for any problems. The doctor carefully checked you, but sometimes problems can develop later. If you have new symptoms or if your symptoms do not get better, get medical care right away.  If you have worse or different chest pain or pressure that lasts more than 5 minutes or you passed out (lost consciousness), call 911 or seek other emergency help right away.   A medical visit is only one step in your treatment. Even if you feel better, you still need to do what your doctor recommends, such as going to all suggested follow-up appointments and taking medicines exactly as directed. This will help you recover and help prevent future problems.  How can you care for yourself at home?  ?? Rest until you feel better.  ?? Take your medicine exactly as prescribed. Call your doctor if you think you are having a problem with your medicine.  ?? Do not drive after taking a prescription pain medicine.  When should you call for help?  Call 911 if:   ?? You passed out (lost consciousness).  ?? You have severe difficulty breathing.  ?? You have symptoms of a heart attack. These may include:  ?? Chest pain or pressure, or a strange feeling in your chest.  ?? Sweating.  ?? Shortness of breath.  ?? Nausea or vomiting.  ?? Pain, pressure, or a strange feeling in your back, neck, jaw, or upper belly or in one or both shoulders or arms.  ?? Lightheadedness or sudden weakness.  ?? A fast or irregular heartbeat.  After you call 911, the operator may tell you to chew 1 adult-strength or  2 to 4 low-dose aspirin. Wait for an ambulance. Do not try to drive yourself.  Call your doctor today if:   ?? You have any trouble breathing.  ?? Your chest pain gets worse.  ?? You are dizzy or lightheaded, or you feel like you may faint.  ?? You are not getting better as expected.  ?? You are having new or different chest pain.  Where can you learn more?  Go to http://www.healthwise.net/GoodHelpConnections  Enter A120 in the search box to learn more about "Chest Pain: Care Instructions."  ?? 2006-2016 Healthwise, Incorporated. Care instructions adapted under license by [employer] (which disclaims liability or warranty for this information). This care instruction is for use with your licensed healthcare professional. If you have questions about a medical condition or this instruction, always ask your healthcare professional. Healthwise, Incorporated disclaims any warranty or liability for your use of this information.  Content Version: 10.9.538570; Current as of: May 29, 2013        Musculoskeletal Chest Pain: Care Instructions  Your Care Instructions  Chest pain is not always a sign that something is wrong with your heart or that you have another serious problem. The doctor thinks your chest pain is caused by strained muscles or ligaments, inflamed chest cartilage, or another problem in your chest, rather than by your heart. You may need more   tests to find the cause of your chest pain.  Follow-up care is a key part of your treatment and safety. Be sure to make and go to all appointments, and call your doctor if you are having problems. It???s also a good idea to know your test results and keep a list of the medicines you take.  How can you care for yourself at home?  ?? Take pain medicines exactly as directed.  ?? If the doctor gave you a prescription medicine for pain, take it as prescribed.  ?? If you are not taking a prescription pain medicine, ask your doctor if you can take an over-the-counter medicine.   ?? Rest and protect the sore area.  ?? Stop, change, or take a break from any activity that may be causing your pain or soreness.  ?? Put ice or a cold pack on the sore area for 10 to 20 minutes at a time. Try to do this every 1 to 2 hours for the next 3 days (when you are awake) or until the swelling goes down. Put a thin cloth between the ice and your skin.  ?? After 2 or 3 days, apply a heating pad set on low or a warm cloth to the area that hurts. Some doctors suggest that you go back and forth between hot and cold.  ?? Do not wrap or tape your ribs for support. This may cause you to take smaller breaths, which could increase your risk of lung problems.  ?? Mentholated creams such as Bengay or Icy Hot may soothe sore muscles. Follow the instructions on the package.  ?? Follow your doctor's instructions for exercising.  ?? Gentle stretching and massage may help you get better faster. Stretch slowly to the point just before pain begins, and hold the stretch for at least 15 to 30 seconds. Do this 3 or 4 times a day. Stretch just after you have applied heat.  ?? As your pain gets better, slowly return to your normal activities. Any increased pain may be a sign that you need to rest a while longer.  When should you call for help?  Call 911 anytime you think you may need emergency care. For example, call if:  ?? You have chest pain or pressure. This may occur with:  ?? Sweating.  ?? Shortness of breath.  ?? Nausea or vomiting.  ?? Pain that spreads from the chest to the neck, jaw, or one or both shoulders or arms.  ?? Dizziness or lightheadedness.  ?? A fast or uneven pulse.  After calling 911, chew 1 adult-strength aspirin. Wait for an ambulance. Do not try to drive yourself.  ?? You have sudden chest pain and shortness of breath, or you cough up blood.  Call your doctor now or seek immediate medical care if:  ?? You have any trouble breathing.  ?? Your chest pain gets worse.   ?? Your chest pain occurs consistently with exercise and is relieved by rest.  Watch closely for changes in your health, and be sure to contact your doctor if:  ?? Your chest pain does not get better after 1 week.  Where can you learn more?  Go to StreetWrestling.at  Enter V293 in the search box to learn more about "Musculoskeletal Chest Pain: Care Instructions."  ?? 2006-2016 Healthwise, Incorporated. Care instructions adapted under license by [employer] (which disclaims liability or warranty for this information). This care instruction is for use with your licensed healthcare professional. If  you have questions about a medical condition or this instruction, always ask your healthcare professional. [place] any warranty or liability for your use of this information.  Content Version: 10.9.538570; Current as of: November 27, 2013    Go to ER if notice SOB that worsens with exertion or persists.  If you have chest discomfort "feeling like an elephant is sitting on your chest".  If it travels into the neck or down the arm.  If you break out in a cold clammy sweat with the chest discomfort.  Chew 2 baby aspirin and dial 911.  Do not drive yourself to the ER.          Starting a Weight Loss Plan: Care Instructions  Your Care Instructions  If you are thinking about losing weight, it can be hard to know where to start. Your doctor can help you set up a weight loss plan that best meets your needs. You may want to take a class on nutrition or exercise, or join a weight loss support group. If you have questions about how to make changes to your eating or exercise habits, ask your doctor about seeing a registered dietitian or an exercise specialist.  It can be a big challenge to lose weight. But you do not have to make huge changes at once. Make small changes, and stick with them. When those changes become habit, add a few more changes.   If you do not think you are ready to make changes right now, try to pick a date in the future. Make an appointment to see your doctor to discuss whether the time is right for you to start a plan.  Follow-up care is a key part of your treatment and safety. Be sure to make and go to all appointments, and call your doctor if you are having problems. It???s also a good idea to know your test results and keep a list of the medicines you take.  How can you care for yourself at home?  ?? Set realistic goals. Many people expect to lose much more weight than is likely. A weight loss of 5% to 10% of your body weight may be enough to improve your health.  ?? Get family and friends involved to provide support. Talk to them about why you are trying to lose weight, and ask them to help. They can help by participating in exercise and having meals with you, even if they may be eating something different.  ?? Find what works best for you. If you do not have time or do not like to cook, a program that offers meal replacement bars or shakes may be better for you. Or if you like to prepare meals, finding a plan that includes daily menus and recipes may be best.  ?? Ask your doctor about other health professionals who can help you achieve your weight loss goals.  ?? A dietitian can help you make healthy changes in your diet.  ?? An exercise specialist or personal trainer can help you develop a safe and effective exercise program.  ?? A counselor or psychiatrist can help you cope with issues such as depression, anxiety, or family problems that can make it hard to focus on weight loss.  ?? Consider joining a support group for people who are trying to lose weight. Your doctor can suggest groups in your area.  Where can you learn more?  Go to StreetWrestling.at  Enter U357 in the search box to learn more  about "Starting a Weight Loss Plan: Care Instructions."   ?? 2006-2016 Healthwise, Incorporated. Care instructions adapted under license by [facility] [employer] (which disclaims liability or warranty for this information). This care instruction is for use with your licensed healthcare professional. If you have questions about a medical condition or this instruction, always ask your healthcare professional. [place] any warranty or liability for your use of this information.  Content Version: 10.9.538570; Current as of: February 23, 2014      WEIGHT LOSS PLAN    1.  Read food labels and count calories.  Goal 1000-1200 calories daily for women and 1200-1600 calories daily for men.  Achieve this by decreasing caloric intake by 500 calories by weekly increments.  NOTE:  1 pound = 3500 calories!  Www.loseit.com  BikingRewards.pl  Www.sparkpeople.com  Www.healthfinder.gov  Weight watchers mobile    Calories needed to lose 1-2 pounds a week = 10 x your weight in pounds    2.  Increase water intake.    Per [facility], it is important to drink 1/2 your body weight in ounces of water daily.  Decrease your water consumption 2-3 hours before bedtime to prevent sleep disturbance from frequent urination.    3.  Decrease sugary beverages.  Each can or glass of soda increases your risk of obesity by 60%.  Can lose 10 pounds in a month by avoiding any soda.     12 oz can of soda = 140 calories, 16 oz cup of sweet tea = 200 calories    16 oz orange juice = 200 calories, 10 oz apple juice = 150 calories   32 oz sports drink = 200 calories, 16 oz punch = 240 calories   3.5 oz alcohol = 100-150 calories     4.  Avoid High Fructose Corn Syrup products.  This ingredient makes products highly addictive!    5.  Exercise 30 minutes daily 5 days weekly, minimally.  If you burn 3500 calories that equals a pound!  Use a pedometer to count steps. Visit www.JustKeepMoving.com for a free pedometer and diet recommendations.       Your maximum heart rate = 220 - your age    Never exercise at your maximum heart rate.    See handout for target heart rate.    6.  Decrease carbohydrates (white bread, pasta, rice, potatoes, sweet foods and sweet drinks like soda, tea, coffee, juice and sports drinks).  Increase fiber and protein.    Goal:  50-150 calories daily of carbohydrates     Try CHROMIUM PICONATE 200 MG THREE TIMES DAILY,    to decrease Premenstrual carbohydrate cravings.    7.  Eat 3-5 small meals daily, include lots of protein (beans/legumes, nuts, lean meat, eggs) and green vegetables with each.  (Breakfast, lunch, dinner with 2 healthy snacks)    8.  Get proper rest 7-8 hours uninterrupted.  When you get less than 6 hours, it triggers hunger by affecting your [place]:Leptin ratio and this results in weight gain.    9.  Watch your food portions.  Green leafy vegetables should cover 1/2 of the plate, lean meat 1/4 of the plate, starchy vegetable 1/4 of the plate.  Use smaller plates.    10.  Do not eat until you are full.  Eat until you are no longer hungry.  If you are not sure, try drinking a glass of water before getting your second serving of food.  11.  Do not weigh yourself daily.  Wait until your next office visit.  Use how you feel and  how your clothes fit as measurements of success.    12.  Address your spirituality to draw strength from above during your journey.  Remember "I am fearfully and wonderfully made.  Marvelous in His eyes."    13.  Set realistic, appropriate and achievable weight loss goals:     RECOMMENDED TARGET WEIGHT LOSS:  Initial weight loss of 5-10% of your initial body weight achieved over 6 months or a decrease of 2 BMI units.     MINIMUM GOAL OF WEIGHT LOSS:  Reduce body weight and maintain a lower body weight.  Prevent weight gain.          RELATED WEBSITES:      Www.obesityaction.org  (and consider joining the Obesity Action Coalition for $25/year.  The [facility]  mission is to elevated and empower those affected by obesity through education, advocacy and support.  Quarterly journals included in membership fee.)    Www.thebiggestloser.com  PatentHood.ch     RELATED DIETS:  Dr. Allene Dillon [place] Weight loss challenge, Mediterranean diet, [place], [employer] Watchers, Paleo Diet (anti-inflammatory diet)        EXERCISE 150 MINUTES WEEKLY.  THIS CAN BE ACHIEVED BY WORKING OUT OR WALKING A MINIMUM OF 30 MINUTES FOR 5 DAYS WEEKLY.  YOU CAN EXERCISE IN INCREMENTS OF 10-15 MINUTES UP TO 3 TIMES A DAY.    Consider performing "brainless exercise."  Choose your favorite tv program.  Five minutes before and for 5 minutes after the tv program, stretch your body.  While the program is on, walk in place watching the show.  When commercials come on, rest or walk around the house to do other things.  When the program begins, return to walking in place.  When you are able keep walking during the commercials and add light weights to your ankles or hands.  By the end of the show, you would have walked 30 minutes.  If you need shorter spurts of exercise, walk during the commercials and rest during the show.    Drink to glasses of water prior to any exercise to prevent dehydration and to improve the results of the work out.      Your RESTING HEART RATE is the number of times your heart beats per minute when you are not exerting yourself.  The more fit you are, the lower your resting heart rate will be.    Your MAXIMUM HEART RATE is the number of times per minute your heart pumps when it is working at 100% capacity.  NEVER EXERCISE AT YOUR MAXIMUM HEART RATE.    MAX HEART RATE = 220 - your age    For example, in a 47 year old, the maximum heart rate is 220-50 = 170 beats per minute    Your [place] is the number of beats per minute our heart should pump during aerobic exercise.  Reaching your target heart rate indicates  that your body is receiving maximum cardiovascular and fat burning benefits.     If you are fit, your TARGET HEART RATE = 70-85% of your maximum Heart rate      For a 49 year old with vigorous intensity physical activity,         Target heart rate= 170 bpm x .70 = 119 bpm     Target heart rate = 170 bpm x .85=  145 bpm        Therefore, your target heart rate during physical activity is 119-145      If you are not fit, TARGET HEART RATE = 50-70% of your maximum Heart rate    MODERATE CONSISTENT AEROBIC EXERCISE OR WALKING FOR AT LEAST 150 MINUTES WEEKLY IS AN ESSENTIAL PART OF ANY WEIGHT LOSS OF WEIGHT MAINTENANCE PROGRAM.     During WEIGHT LOSS PHASE, at least 75% of your exercise needs to be walking or moderate aerobic activity and 25% or 0% can be Isometric or Resistance type exercises (the latter can be deferred to the weight maintenance phase.)     During WEIGHT MAINTENANCE PHASE, at least 50% of your exercise needs to be aerobic and 50% Isometric or Resistance type exercises.     BENEFITS OF MODERATE INTENSITY EXERCISE MOST DAYS OF THE WEEK:     1.  Insulin Resistance improves 30-85%   2.  Abdominal Fat decreases by 30%   3.  Inflammatory Markers decrease by 30% (therefore pain decreases)   4.  Systolic and Diastolic Blood Pressure decrease by 4 mmHg   5.  HDL improves by 5%   6.  Triglycerides decrease by 15%   7.  Shift from small dense LDL to large dense LDL (therefore decrease Insulin Resistance)   8.  Decrease coagulability                 HEARTBURN occurs when stomach acid moves back up through your esophagus.  Several conditions and foods can contribute to this process.  Common causes include:    Hiatal Hernia  Pregnancy  Alcohol use  Overweight or Obesity  Smoking.    Consuming certain types of foods or drinks can increase the acid content of the stomach and cause heartburn.  AVOID THESE TRIGGERS:     Stress  Alcoholic or carbonated beverages  Caffeine (coffee, tea, chocolate, soda)   Acidic foods or drinks (Oranges or orange juice, apples, apple juice, grapefruit or grapefruit juice, bananas, grape juice)  Tomatoes or tomato based foods (pizza, spaghetti, chili)  Greasy, Fatty or Fried foods  Spicy Foods (including hot sauce)  Garlic  Onions  Mint flavoring (peppermint, speramint, cinnamon).    PREVENTIVE MEASURES    Do not wear tight, restrictive clothing.  Lose weight.  Elevate the head of the bed 4 to 6 inches with blocks or a wedge pillow.  Wait 2 to 3 hours after a meal before lying down. Avoid the triggers.    MEDICATIONS     Try over the counter medications if needed first.  These include:  TUMS, ROLAIDS, Mylanta, Prilosec 20 mg, Pepcid 20 mg, Tagamet, Zantac up to 150 mg twice daily or 300 mg daily, Nexium 20 mg up to 40 mg daily and Prevacid up to 30 mg daily.  Ideally, take for 2 weeks after symptoms consistently appear.      NOTIFY OUR OFFICE    If symptoms worsen or persist.   If breathing or swallowing becomes difficult.   If you regurgitate blood.    DIAL 911 IF THE HEART BURN SYMPTOMS ARE ACCOMPANIED BY   Shortness of breath, sweating, pain in the jaw, neck or arm, cold clammy feeling with nausea or vomiting.  This could be a HEART ATTACK.    Gastritis: Care Instructions  Your Care Instructions     Gastritis is a sore and upset stomach. It happens when something irritates the stomach lining. Many things can cause it. These  include an infection such as the flu or something you ate or drank. Medicines or a sore on the lining of the stomach (ulcer) also can cause it. Your belly may bloat and ache. You may belch, vomit, and feel sick to your stomach.  You should be able to relieve the problem by taking medicine. And it may help to change your diet. If gastritis lasts, your doctor may prescribe medicine.  Follow-up care is a key part of your treatment and safety. Be sure to make and go to all appointments, and call your doctor if you are having  problems. It's also a good idea to know your test results and keep a list of the medicines you take.  How can you care for yourself at home?  ?? If your doctor prescribed antibiotics, take them as directed. Do not stop taking them just because you feel better. You need to take the full course of antibiotics.  ?? Be safe with medicines. If your doctor prescribed medicine to decrease stomach acid, take it as directed. Call your doctor if you think you are having a problem with your medicine.  ?? Do not take any other medicine, including over-the-counter pain relievers, without talking to your doctor first.  ?? If your doctor recommends over-the-counter medicine to reduce stomach acid, such as Pepcid AC, Prilosec, Tagamet HB, or Zantac 75, follow the directions on the label.  ?? Drink plenty of fluids (enough so that your urine is light yellow or clear like water) to prevent dehydration. Choose water and other caffeine-free clear liquids. If you have kidney, heart, or liver disease and have to limit fluids, talk with your doctor before you increase the amount of fluids you drink.  ?? Limit how much alcohol you drink.  ?? Avoid coffee, tea, cola drinks, chocolate, and other foods with caffeine. They increase stomach acid.  When should you call for help?  Call 911 anytime you think you may need emergency care. For example, call if:  ?? You vomit blood or what looks like coffee grounds.  ?? You pass maroon or very bloody stools.  Call your doctor now or seek immediate medical care if:  ?? You start breathing fast and have not produced urine in the last 8 hours.  ?? You cannot keep fluids down.  Watch closely for changes in your health, and be sure to contact your doctor if:  ?? You do not get better as expected.  Where can you learn more?  Go to StreetWrestling.at  Enter Z536 in the search box to learn more about "Gastritis: Care Instructions."   ?? 2006-2016 Healthwise, Incorporated. Care instructions adapted under license by [employer] (which disclaims liability or warranty for this information). This care instruction is for use with your licensed healthcare professional. If you have questions about a medical condition or this instruction, always ask your healthcare professional. [place] any warranty or liability for your use of this information.  Content Version: 10.9.538570; Current as of: November 27, 2013        Gastritis: Care Instructions  Your Care Instructions     Gastritis is a sore and upset stomach. It happens when something irritates the stomach lining. Many things can cause it. These include an infection such as the flu or something you ate or drank. Medicines or a sore on the lining of the stomach (ulcer) also can cause it. Your belly may bloat and ache. You may belch, vomit, and feel sick to your stomach.  You should be able to relieve the problem by taking medicine. And it may help to change your diet. If gastritis lasts, your doctor may prescribe medicine.  Follow-up care is a key part of your treatment and safety. Be sure to make and go to all appointments, and call your doctor if you are having problems. It's also a good idea to know your test results and keep a list of the medicines you take.  How can you care for yourself at home?  ?? If your doctor prescribed antibiotics, take them as directed. Do not stop taking them just because you feel better. You need to take the full course of antibiotics.  ?? Be safe with medicines. If your doctor prescribed medicine to decrease stomach acid, take it as directed. Call your doctor if you think you are having a problem with your medicine.  ?? Do not take any other medicine, including over-the-counter pain relievers, without talking to your doctor first.  ?? If your doctor recommends over-the-counter medicine to reduce stomach  acid, such as Pepcid AC, Prilosec, Tagamet HB, or Zantac 75, follow the directions on the label.  ?? Drink plenty of fluids (enough so that your urine is light yellow or clear like water) to prevent dehydration. Choose water and other caffeine-free clear liquids. If you have kidney, heart, or liver disease and have to limit fluids, talk with your doctor before you increase the amount of fluids you drink.  ?? Limit how much alcohol you drink.  ?? Avoid coffee, tea, cola drinks, chocolate, and other foods with caffeine. They increase stomach acid.  When should you call for help?  Call 911 anytime you think you may need emergency care. For example, call if:  ?? You vomit blood or what looks like coffee grounds.  ?? You pass maroon or very bloody stools.  Call your doctor now or seek immediate medical care if:  ?? You start breathing fast and have not produced urine in the last 8 hours.  ?? You cannot keep fluids down.  Watch closely for changes in your health, and be sure to contact your doctor if:  ?? You do not get better as expected.  Where can you learn more?  Go to [email or web address]  Enter Z536 in the search box to learn more about "Gastritis: Care Instructions."  ?? 2006-2016 [employer]. Care instructions adapted under license by [employer] (which disclaims liability or warranty for this information). This care instruction is for use with your licensed healthcare professional. If you have questions about a medical condition or this instruction, always ask your healthcare professional. [place] any warranty or liability for your use of this information.  Content Version: 10.9.538570; Current as of: November 27, 2013    HEARTBURN occurs when stomach acid moves back up through your esophagus.  Several conditions and foods can contribute to this process.  Common causes include:    Hiatal Hernia  Pregnancy  Alcohol use  Overweight or Obesity   Smoking.    Consuming certain types of foods or drinks can increase the acid content of the stomach and cause heartburn.  AVOID THESE TRIGGERS:     Stress  Alcoholic or carbonated beverages  Caffeine (coffee, tea, chocolate, soda)  Acidic foods or drinks (Oranges or orange juice, apples, apple juice, grapefruit or grapefruit juice, bananas, grape juice)  Tomatoes or tomato based foods (pizza, spaghetti, chili)  Greasy, Fatty or Fried foods  Spicy Foods (including hot sauce)  Garlic  Onions  Mint flavoring (peppermint, speramint, cinnamon).    PREVENTIVE MEASURES    Do not wear tight, restrictive clothing.  Lose weight.  Elevate the head of the bed 4 to 6 inches with blocks or a wedge pillow.  Wait 2 to 3 hours after a meal before lying down. Avoid the triggers.    MEDICATIONS     Try over the counter medications if needed first.  These include:  TUMS, ROLAIDS, Mylanta, Prilosec 20 mg, Pepcid 20 mg, Tagamet, Zantac up to 150 mg twice daily or 300 mg daily, Nexium 20 mg up to 40 mg daily and Prevacid up to 30 mg daily.  Ideally, take for 2 weeks after symptoms consistently appear.      NOTIFY OUR OFFICE    If symptoms worsen or persist.   If breathing or swallowing becomes difficult.   If you regurgitate blood.    DIAL 911 IF THE HEART BURN SYMPTOMS ARE ACCOMPANIED BY   Shortness of breath, sweating, pain in the jaw, neck or arm, cold clammy feeling with nausea or vomiting.  This could be a HEART ATTACK.      Gastritis: Care Instructions  Your Care Instructions     Gastritis is a sore and upset stomach. It happens when something irritates the stomach lining. Many things can cause it. These include an infection such as the flu or something you ate or drank. Medicines or a sore on the lining of the stomach (ulcer) also can cause it. Your belly may bloat and ache. You may belch, vomit, and feel sick to your stomach.  You should be able to relieve the problem by taking medicine. And it may  help to change your diet. If gastritis lasts, your doctor may prescribe medicine.  Follow-up care is a key part of your treatment and safety. Be sure to make and go to all appointments, and call your doctor if you are having problems. It's also a good idea to know your test results and keep a list of the medicines you take.  How can you care for yourself at home?  ?? If your doctor prescribed antibiotics, take them as directed. Do not stop taking them just because you feel better. You need to take the full course of antibiotics.  ?? Be safe with medicines. If your doctor prescribed medicine to decrease stomach acid, take it as directed. Call your doctor if you think you are having a problem with your medicine.  ?? Do not take any other medicine, including over-the-counter pain relievers, without talking to your doctor first.  ?? If your doctor recommends over-the-counter medicine to reduce stomach acid, such as Pepcid AC, Prilosec, Tagamet HB, or Zantac 75, follow the directions on the label.  ?? Drink plenty of fluids (enough so that your urine is light yellow or clear like water) to prevent dehydration. Choose water and other caffeine-free clear liquids. If you have kidney, heart, or liver disease and have to limit fluids, talk with your doctor before you increase the amount of fluids you drink.  ?? Limit how much alcohol you drink.  ?? Avoid coffee, tea, cola drinks, chocolate, and other foods with caffeine. They increase stomach acid.  When should you call for help?  Call 911 anytime you think you may need emergency care. For example, call if:  ?? You vomit blood or what looks like coffee grounds.  ?? You pass maroon or very bloody stools.  Call your doctor now or seek immediate medical care  if:  ?? You start breathing fast and have not produced urine in the last 8 hours.  ?? You cannot keep fluids down.  Watch closely for changes in your health, and be sure to contact your doctor if:  ?? You do not get better as expected.   Where can you learn more?  Go to [email or web address]  Enter Z536 in the search box to learn more about "Gastritis: Care Instructions."  ?? 2006-2016 [employer]. Care instructions adapted under license by [employer] (which disclaims liability or warranty for this information). This care instruction is for use with your licensed healthcare professional. If you have questions about a medical condition or this instruction, always ask your healthcare professional. [place] any warranty or liability for your use of this information.  Content Version: 10.9.538570; Current as of: November 27, 2013

## 2014-09-24 NOTE — Progress Notes (Signed)
HISTORY OF PRESENT ILLNESS  Katrina [name] is a 49 y.o. female presents with Hospital Follow Up (9/9/16[facility]); Chest Pain; Results; and Nausea    Agree with nurse note.    Pt with hx of tobacco abuse was at admitted at [facility] on 09/17/14 and discharged on 09/18/14. The morning of 09/17/14, she went to the gym and walked 3.5 mph x30 minutes on the treadmill. She has been experiencing SOB upon exertion for months. When she walks up her stairs at home, she noticed her legs are burning. She also has been nauseated in the mornings with improvement throughout the day. She had a NP check her BP while she was at work later that day and it was 182/108 so she went to [facility]. Dx'd chest pain. Tx'd with sliding scale of insulin and Metformin. Rx'd Aspirin 81 mg. Cardiologist, Dr. Shelly Rubenstein was consulted. He mentioned she has not taken BP medication since her adrenalectomy in 2014. Her blood work did not suggest a blood clot in her lung. EKG showed normal sinus rhythm. He ordered a 2D echocardiogram, which was performed on 09/18/14. Her EF was 60-65%. There was noted trivial triscupid valve regurgitation. Her chest xray was normal but showed degenerative changes of the thoracic spine of mild to moderate degree. She had a stress test performed yesterday and is awaiting the results. She also has tenderness below her L breast and can feel it sometimes when she breathes.     She requests her most recent labs from 09/17/14. Her Hgb A1C was 5.9. Her troponin level was less than 0.04. Her TSH was 1.31. In 12/2013, her insulin was 14. Her Vitamin D was 32. She is participating in a study at [facility] and has labs drawn q6 months.     Diabetic, hypertensive pt with hypothyroidism, Vitamin D deficiency, dyslipidemia, L subconjunctival hemorrhage, and fatty liver presents to the office with a BP of 142/93. She weighs 206 lbs, gained 3 lbs since last ov and 10 lbs since 02/2014. She attributes her weight gain to "being  on the run" often and being at school until late. She finds it difficult to cook for one person.      Pt with inadequate sleep hygiene and OSA. She has not used CPAP since 11/2011 and has not needed it since.     Written by Lyndee Hensen, scribe, as dictated by Dr. Felix Ahmadi, DO.    ROS    Review of Systems negative except as noted above in HPI.    ALLERGIES:    Allergies   Allergen Reactions   ??? Aldactone [Spironolactone] Swelling   ??? Codeine Rash and Itching   ??? Paxil [Paroxetine Hcl] Other (comments)     WORSENED DEPRESSION.       CURRENT MEDICATIONS:    Outpatient Prescriptions Marked as Taking for the 09/24/14 encounter (Office Visit) with Talbert Cage, DO   Medication Sig Dispense Refill   ??? aspirin 81 mg chewable tablet Take 1 Tab by mouth daily for 30 days. 30 Tab 0   ??? levothyroxine (SYNTHROID) 88 mcg tablet TAKE 1 TABLET DAILY BEFORE BREAKFAST FOR HYPOTHYROIDISM 90 Tab 0   ??? sertraline (ZOLOFT) 100 mg tablet Take 1 Tab by mouth daily. Indications: MAJOR DEPRESSIVE DISORDER 90 Tab 3   ??? cholecalciferol, vitamin D3, (VITAMIN D3) 2,000 unit tab Take 4,000 Units by mouth daily.     ??? metFORMIN ER (GLUCOPHAGE XR) 500 mg tablet Take 2 Tabs by mouth Before breakfast and dinner. Indications: TYPE  2 DIABETES MELLITUS 360 Tab 3   ??? b complex vitamins tablet Take 1 Tab by mouth daily.       ??? ascorbic acid (VITAMIN C) 500 mg tablet Take 500 mg by mouth daily.     ??? vitamin E (AQUA GEMS) 400 unit capsule Take 400 Units by mouth daily.         PAST MEDICAL HISTORY:    Past Medical History   Diagnosis Date   ??? Abnormal gall bladder diagnostic imaging 07/2000     Abnormal HIDA.  Dr. Tomasita Morrow.   ??? Adrenal adenoma 06/2009     Dr. Mcneil Sober, nephro.  Dr. Boston Service, surgeon.   ??? 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  Dr. Gerilyn Nestle.   ??? Chest pain of uncertain etiology 31/54/0086     Dr. Sinclair Ship.  Normal Stress Echo 12/26/09.  Dr. Maurene Capes, hospital   ??? Chronic insomnia 2009, 2011      Dr. Dineen Kid.   ??? Dyslipidemia 03/24/2007     Improving with weight loss   ??? Essential hypertension 1995-03-24     Dr. Mcneil Sober, nephro.  Dr. Eli Phillips, ophth.   ??? Family history of breast cancer in mother 3     MRI, 6 mo.  Dr. Gerre Pebbles   ??? Fatty liver 07/25/2007     with elevated AST, ALT.  In NASH study at [facility] (Dr. Hilary Hertz)   ??? Heartburn 07/25/2007     Has improved with weight loss   ??? 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 Major depression, recurrent ([place]) 24-Mar-1990, Mar 24, 1995, 03/24/07, 05/2013     situational.  fiance's death (24-Mar-1995). Clydell Hakim, counselor.    ??? Obesity 07/25/2007     Improved with Lifestyle Modifications   ??? OSA (obstructive sleep apnea) 11/2010     Mild.  Txd CPAP 8 cm H20.  Dr. Bretta Bang.  stopped 11/2011.   ??? Osteoarthritis of lumbar spine      MRI 12/14   ??? Osteoarthritis of thoracic spine      MRI 12/14, Stable   ??? Type 2 diabetes, diet controlled ([place]) 2003-03-24   ??? Vitamin D deficiency 05/2010       PAST SURGICAL HISTORY:    Past Surgical History   Procedure Laterality Date   ??? Pr appendectomy  07/2000     Dr. Tomasita Morrow.   ??? Hx tonsillectomy  Mar 23, 1972   ??? Hx breast reduction  1989     Bilaterally.  Dr. Ashby Dawes.    ??? Hx cholecystectomy  07/2000     Gallbladder malfunctioning.  Dr. Tomasita Morrow.   ??? Hx other surgical Right 08/01/09     LAP ADRENALECTOMY.  due to benign adrenal adenoma/tumor.  Dr. Boston Service.   ??? Hx other surgical  2008, 2013     LIVER BIOPSY X 2.         FAMILY HISTORY:    Family History   Problem Relation Age of Onset   ??? Hypertension Mother    ??? Breast Cancer Mother 65   ??? Stroke Mother 29   ??? Anxiety Mother    ??? Depression Mother    ??? Diabetes Father    ??? Hypertension Father    ??? Kidney Disease Father      reversed   ??? High Cholesterol Father    ??? Stroke Sister 21     Carotid Artery Dissection   ??? Other Sister  psedotumor cerebri   ??? Diabetes Paternal Grandmother    ??? Hypertension Paternal Grandmother    ??? Cancer Paternal Grandmother       ?LEUKEMIA   ??? Hypertension Paternal Grandfather    ??? High Cholesterol Paternal Grandfather    ??? Diabetes Maternal Grandmother    ??? Hypertension Maternal Grandmother    ??? Diabetes Maternal Grandfather    ??? Hypertension Maternal Grandfather    ??? Cancer Paternal Uncle      PANCREATIC   ??? Cancer Maternal Uncle      PANCREATIC       SOCIAL HISTORY:    Social History     Social History   ??? Marital status: SINGLE     Spouse name: Single   ??? Number of children: 0   ??? Years of education: N/A     Occupational History   ??? [employer]     Social History Main Topics   ??? Smoking status: Former Smoker     Packs/day: 0.30     Years: 10.00     Types: Cigarettes     Quit date: 02/08/2009   ??? Smokeless tobacco: Never Used   ??? Alcohol use 0.5 oz/week     1 Glasses of wine per week   ??? Drug use: No   ??? Sexual activity: Yes     Other Topics Concern   ??? None     Social History Narrative       IMMUNIZATIONS:    Immunization History   Administered Date(s) Administered   ??? Hep B Vaccine 06/08/2000   ??? Influenza Vaccine 10/14/2013   ??? Influenza Vaccine Split 10/24/2010   ??? Pneumococcal Polysaccharide (PPSV-23) 12/22/2010   ??? Pneumococcal Vaccine (Unspecified Type) 12/05/2005   ??? TD Vaccine 10/23/2000   ??? TDAP Vaccine 10/24/2010         PHYSICAL EXAMINATION    Vital Signs    Visit Vitals   ??? BP (!) 142/93 (BP 1 Location: Right arm, BP Patient Position: Sitting)   ??? Pulse 77   ??? Temp 98.5 ??F (36.9 ??C) (Oral)   ??? Resp 18   ??? Ht _0  (1.651 m)   ??? Wt 206 lb 11.2 oz (93.8 kg)   ??? LMP 09/01/2014   ??? SpO2 99%   ??? BMI 34.4 kg/m2       Weight Metrics 09/24/2014 04/28/2014 02/26/2014 02/08/2014 11/09/2013 08/04/2013 07/31/2013   Weight 206 lb 11.2 oz 203 lb 9.6 oz 197 lb 12.8 oz 199 lb 3.2 oz 191 lb 190 lb 190 lb 12.8 oz   BMI 34.4 kg/m2 33.88 kg/m2 32.92 kg/m2 33.15 kg/m2 31.78 kg/m2 31.62 kg/m2 31.75 kg/m2       General appearance - Well nourished. Well appearing.  Well developed.  No acute distress. Overweight.    Head - Normocephalic.  Atraumatic.    Eyes - pupils equal and reactive. Extraocular eye movements intact. Sclera anicteric.  Mildly injected sclera. Small L conjunctival hemorrhage noted laterally.  Ears - Hearing is grossly normal bilaterally.      Nose - normal and patent.  No polyps noted.  No erythema.  No discharge.    Mouth - mucous membranes with adequate moisture.  Posterior pharynx normal with cobblestone appearance.  No erythema, white exudate or obstruction.  Neck - supple.  Midline trachea.  No carotid bruits noted bilaterally.  No thyromegaly noted.  Chest - clear to auscultation bilaterally anteriorly and posteriorly.  No wheezes.  No rales or rhonchi.  Breath sounds are symmetrical bilaterally.  Unlabored respirations. Localized pain on palpation at mid nipple line of the rib angle #9.  Heart - normal rate.  Regular rhythm.  Normal S1, S2.  No murmur noted.  No rubs, clicks or gallops noted.  Abdomen - soft and nondistended.  No masses or organomegaly.  No rebound, rigidity or guarding.  Bowel sounds normal x 4 quadrants.  No tenderness noted.  Neurological - awake, alert and oriented to person, place, and time and event.  Cranial nerves II through XII intact.  Clear speech.  Muscle strength is +5/5 x 4 extremities.  Sensation is intact to light touch bilaterally.  Steady gait.   Heme/Lymph - peripheral pulses normal x 4 extremities.  No peripheral edema is noted.    Musculoskeletal - Intact x 4 extremities.  Full ROM x 4 extremities.  No pain with movement.    Back exam - normal range of motion.  No pain on palpation of the spinous processes in the cervical, thoracic, lumbar, sacral regions.  No CVA tenderness.    Skin - no rashes, erythema, ecchymosis, lacerations, abrasions, suspicious moles noted  Psychological -   normal behavior, dress and thought processes.  Good insight. Good eye contact.  Normal affect.  Appropriate mood.  Normal speech.    DATA REVIEWED     Results for orders placed or performed during the hospital encounter of 09/17/14   CBC WITH AUTOMATED DIFF   Result Value Ref Range    WBC 8.9 3.6 - 11.0 K/uL    RBC 4.83 3.80 - 5.20 M/uL    HGB 13.6 11.5 - 16.0 g/dL    HCT 41.0 35.0 - 47.0 %    MCV 84.9 80.0 - 99.0 FL    MCH 28.2 26.0 - 34.0 PG    MCHC 33.2 30.0 - 36.5 g/dL    RDW 13.0 11.5 - 14.5 %    PLATELET 326 150 - 400 K/uL    NEUTROPHILS 62 32 - 75 %    LYMPHOCYTES 29 12 - 49 %    MONOCYTES 6 5 - 13 %    EOSINOPHILS 3 0 - 7 %    BASOPHILS 0 0 - 1 %    ABS. NEUTROPHILS 5.5 1.8 - 8.0 K/UL    ABS. LYMPHOCYTES 2.6 0.8 - 3.5 K/UL    ABS. MONOCYTES 0.5 0.0 - 1.0 K/UL    ABS. EOSINOPHILS 0.3 0.0 - 0.4 K/UL    ABS. BASOPHILS 0.0 0.0 - 0.1 K/UL   METABOLIC PANEL, COMPREHENSIVE   Result Value Ref Range    Sodium 140 136 - 145 mmol/L    Potassium 3.6 3.5 - 5.1 mmol/L    Chloride 105 97 - 108 mmol/L    CO2 25 21 - 32 mmol/L    Anion gap 10 5 - 15 mmol/L    Glucose 95 65 - 100 mg/dL    BUN 12 6 - 20 MG/DL    Creatinine 0.75 0.55 - 1.02 MG/DL    BUN/Creatinine ratio 16 12 - 20      GFR est AA >60 >60 ml/min/1.14m    GFR est non-AA >60 >60 ml/min/1.78m   Calcium 9.3 8.5 - 10.1 MG/DL    Bilirubin, total 0.3 0.2 - 1.0 MG/DL    ALT 56 12 - 78 U/L    AST 27 15 - 37 U/L    Alk. phosphatase 98 45 - 117 U/L    Protein, total 7.2 6.4 - 8.2 g/dL  Albumin 4.1 3.5 - 5.0 g/dL    Globulin 3.1 2.0 - 4.0 g/dL    A-G Ratio 1.3 1.1 - 2.2     TROPONIN I   Result Value Ref Range    Troponin-I, Qt. <0.04 <0.05 ng/mL   D DIMER   Result Value Ref Range    D-dimer 0.30 0.00 - 0.65 mg/L FEU   TROPONIN I   Result Value Ref Range    Troponin-I, Qt. <0.04 <0.05 ng/mL   HEMOGLOBIN A1C WITH EAG   Result Value Ref Range    Hemoglobin A1c 5.9 4.2 - 6.3 %    Est. average glucose 123 mg/dL   TROPONIN I   Result Value Ref Range    Troponin-I, Qt. <0.04 <0.05 ng/mL   LIPID PANEL   Result Value Ref Range    LIPID PROFILE          Cholesterol, total 194 <200 MG/DL    Triglyceride 123 <150 MG/DL     HDL Cholesterol 43 MG/DL    LDL, calculated 126.4 (H) 0 - 100 MG/DL    VLDL, calculated 24.6 MG/DL    CHOL/HDL Ratio 4.5 0 - 5.0     TSH 3RD GENERATION   Result Value Ref Range    TSH 1.31 0.36 - 0.09 uIU/mL   METABOLIC PANEL, COMPREHENSIVE   Result Value Ref Range    Sodium 138 136 - 145 mmol/L    Potassium 4.0 3.5 - 5.1 mmol/L    Chloride 107 97 - 108 mmol/L    CO2 23 21 - 32 mmol/L    Anion gap 8 5 - 15 mmol/L    Glucose 108 (H) 65 - 100 mg/dL    BUN 10 6 - 20 MG/DL    Creatinine 0.65 0.55 - 1.02 MG/DL    BUN/Creatinine ratio 15 12 - 20      GFR est AA >60 >60 ml/min/1.75m    GFR est non-AA >60 >60 ml/min/1.767m   Calcium 9.2 8.5 - 10.1 MG/DL    Bilirubin, total 0.3 0.2 - 1.0 MG/DL    ALT 56 12 - 78 U/L    AST 26 15 - 37 U/L    Alk. phosphatase 72 45 - 117 U/L    Protein, total 6.7 6.4 - 8.2 g/dL    Albumin 3.7 3.5 - 5.0 g/dL    Globulin 3.0 2.0 - 4.0 g/dL    A-G Ratio 1.2 1.1 - 2.2     GLUCOSE, POC   Result Value Ref Range    Glucose (POC) 83 65 - 100 mg/dL    Performed by BiNathaneil Canary  GLUCOSE, POC   Result Value Ref Range    Glucose (POC) 86 65 - 100 mg/dL    Performed by [facility]   Result Value Ref Range    Glucose (POC) 114 (H) 65 - 100 mg/dL    Performed by [facility]   Result Value Ref Range    Glucose (POC) 91 65 - 100 mg/dL    Performed by GIBarbee CoughNNA    EKG, 12 LEAD, INITIAL   Result Value Ref Range    Ventricular Rate 89 BPM    Atrial Rate 89 BPM    P-R Interval 136 ms    QRS Duration 82 ms    Q-T Interval 402 ms    QTC Calculation (Bezet) 489 ms    Calculated P Axis 56 degrees  Calculated R Axis 58 degrees    Calculated T Axis 25 degrees    Diagnosis       Normal sinus rhythm  No previous ECGs available  Confirmed by Daniel Nones, M.D., [place] [id number]) on 09/17/2014 5:25:27 PM         ASSESSMENT and PLAN      ICD-10-CM ICD-9-CM    1. Type 2 diabetes, diet controlled (HCC) E11.9 250.00 HM DIABETES FOOT EXAM       MICROALBUMIN, UR, RAND W/ MICROALBUMIN/CREA RATIO      ANKLE BRACHIAL INDEX   2. Essential hypertension with goal blood pressure less than 130/80 I10 401.9    3. Claudication of calf muscles (HCC) I73.9 443.9 ANKLE BRACHIAL INDEX   4. Other chest pain R07.89 786.59 REFERRAL TO PULMONARY DISEASE   5. DOE (dyspnea on exertion) R06.09 786.09 REFERRAL TO PULMONARY DISEASE   6. History of tobacco abuse Z87.891 V15.82    7. Hypothyroidism due to acquired atrophy of thyroid E03.8 244.8     E03.4 246.8     stable   8. Weight gain R63.5 783.1     9# since 02/2014   9. Vitamin D deficiency E55.9 268.9     stable   10. Inadequate sleep hygiene Z72.821 307.49    11. Dyslipidemia E78.5 272.4    12. Obesity, Class I, BMI 30-34.9 E66.9 278.00    13. OSA (obstructive sleep apnea) G47.33 327.23     stable since off CPAP 2011   14. Fatty liver K76.0 571.8    15. Subconjunctival hemorrhage, left H11.32 372.72    16. Nausea R11.0 787.02     in morning       Discussed the patient's BMI with her.  The BMI follow up plan is as follows: BMI is out of normal parameters and plan is as follows: I have counseled this patient on diet and exercise regimens.  Addressed weight, diet and exercise with patient.  Decrease carbohydrates (white foods, sweet foods, sweet drinks and alcohol), increase green leafy vegetables and protein (lean meats and beans) with each meal.  Avoid fried foods. Eat 3-5 small meals daily.  Do not skip meals.  Increase water intake.    Increase physical activity to 30 minutes daily for health benefit or 60 minutes daily to prevent weight regain, as tolerated.  Get 7-8 hours uninterrupted sleep nightly.  Chart reviewed and updated.      ABI test ordered.  Continue current medications and care. Take OTC Zantac prn. Limit purse to 5 lbs and alternate shoulders. Pt declines refill of Ativan.   Most recent tests reviewed from 09/17/14 and 09/18/14.  Recheck pertinent labs today.  Recent office visit notes from [facility] reviewed.   Referrals given; patient urged to keep appointments with specialists. Pulmonologist.   Counseled patient on health concerns: Chest pain, weight management, BP, DOE, nausea, gastritis, and leg "burning."   Relevant handouts given and discussed with patient.  Immunizations noted.  Offered empathy, support, legitimation, prayers, partnership to patient.  Praised patient for progress.  Advance Care Booklet given at office visit.   Follow-up Disposition:  Return in about 2 months (around 11/24/2014) for results, doe, hdl results.    Patient was offered a choice/choices in the treatment plan today.  Patient expresses understanding of the plan and agrees with recommendations.    Written by Lyndee Hensen, scribe, as dictated by Dr. Felix Ahmadi, DO.      Documentation True and Accepted by Perl Kerney R. Lorel Monaco, D.O.  Patient Instructions     Chest Pain: Care Instructions  Your Care Instructions  There are many things that can cause chest pain. Some are not serious and will get better on their own in a few days. But some kinds of chest pain need more testing and treatment. Your doctor may have recommended a follow-up visit in the next 8 to 12 hours. If you are not getting better, you may need more tests or treatment.  Even though your doctor has released you, you still need to watch for any problems. The doctor carefully checked you, but sometimes problems can develop later. If you have new symptoms or if your symptoms do not get better, get medical care right away.  If you have worse or different chest pain or pressure that lasts more than 5 minutes or you passed out (lost consciousness), call 911 or seek other emergency help right away.   A medical visit is only one step in your treatment. Even if you feel better, you still need to do what your doctor recommends, such as going to all suggested follow-up appointments and taking medicines exactly as  directed. This will help you recover and help prevent future problems.  How can you care for yourself at home?  ?? Rest until you feel better.  ?? Take your medicine exactly as prescribed. Call your doctor if you think you are having a problem with your medicine.  ?? Do not drive after taking a prescription pain medicine.  When should you call for help?  Call 911 if:   ?? You passed out (lost consciousness).  ?? You have severe difficulty breathing.  ?? You have symptoms of a heart attack. These may include:  ?? Chest pain or pressure, or a strange feeling in your chest.  ?? Sweating.  ?? Shortness of breath.  ?? Nausea or vomiting.  ?? Pain, pressure, or a strange feeling in your back, neck, jaw, or upper belly or in one or both shoulders or arms.  ?? Lightheadedness or sudden weakness.  ?? A fast or irregular heartbeat.  After you call 911, the operator may tell you to chew 1 adult-strength or 2 to 4 low-dose aspirin. Wait for an ambulance. Do not try to drive yourself.  Call your doctor today if:   ?? You have any trouble breathing.  ?? Your chest pain gets worse.  ?? You are dizzy or lightheaded, or you feel like you may faint.  ?? You are not getting better as expected.  ?? You are having new or different chest pain.  Where can you learn more?  Go to [employer].at  Enter A120 in the search box to learn more about "Chest Pain: Care Instructions."  ?? 2006-2016 [employer]. Care instructions adapted under license by [employer] (which disclaims liability or warranty for this information). This care instruction is for use with your licensed healthcare professional. If you have questions about a medical condition or this instruction, always ask your healthcare professional. [place] any warranty or liability for your use of this information.  Content Version: 10.9.538570; Current as of: May 29, 2013        Musculoskeletal Chest Pain: Care Instructions   Your Care Instructions  Chest pain is not always a sign that something is wrong with your heart or that you have another serious problem. The doctor thinks your chest pain is caused by strained muscles or ligaments, inflamed chest cartilage, or another problem in your chest, rather than  by your heart. You may need more tests to find the cause of your chest pain.  Follow-up care is a key part of your treatment and safety. Be sure to make and go to all appointments, and call your doctor if you are having problems. It???s also a good idea to know your test results and keep a list of the medicines you take.  How can you care for yourself at home?  ?? Take pain medicines exactly as directed.  ?? If the doctor gave you a prescription medicine for pain, take it as prescribed.  ?? If you are not taking a prescription pain medicine, ask your doctor if you can take an over-the-counter medicine.  ?? Rest and protect the sore area.  ?? Stop, change, or take a break from any activity that may be causing your pain or soreness.  ?? Put ice or a cold pack on the sore area for 10 to 20 minutes at a time. Try to do this every 1 to 2 hours for the next 3 days (when you are awake) or until the swelling goes down. Put a thin cloth between the ice and your skin.  ?? After 2 or 3 days, apply a heating pad set on low or a warm cloth to the area that hurts. Some doctors suggest that you go back and forth between hot and cold.  ?? Do not wrap or tape your ribs for support. This may cause you to take smaller breaths, which could increase your risk of lung problems.  ?? Mentholated creams such as Bengay or Icy Hot may soothe sore muscles. Follow the instructions on the package.  ?? Follow your doctor's instructions for exercising.  ?? Gentle stretching and massage may help you get better faster. Stretch slowly to the point just before pain begins, and hold the stretch for at least 15 to 30 seconds. Do this 3 or 4 times a day. Stretch just after you  have applied heat.  ?? As your pain gets better, slowly return to your normal activities. Any increased pain may be a sign that you need to rest a while longer.  When should you call for help?  Call 911 anytime you think you may need emergency care. For example, call if:  ?? You have chest pain or pressure. This may occur with:  ?? Sweating.  ?? Shortness of breath.  ?? Nausea or vomiting.  ?? Pain that spreads from the chest to the neck, jaw, or one or both shoulders or arms.  ?? Dizziness or lightheadedness.  ?? A fast or uneven pulse.  After calling 911, chew 1 adult-strength aspirin. Wait for an ambulance. Do not try to drive yourself.  ?? You have sudden chest pain and shortness of breath, or you cough up blood.  Call your doctor now or seek immediate medical care if:  ?? You have any trouble breathing.  ?? Your chest pain gets worse.  ?? Your chest pain occurs consistently with exercise and is relieved by rest.  Watch closely for changes in your health, and be sure to contact your doctor if:  ?? Your chest pain does not get better after 1 week.  Where can you learn more?  Go to [employer].at  Enter V293 in the search box to learn more about "Musculoskeletal Chest Pain: Care Instructions."  ?? 2006-2016 [employer]. Care instructions adapted under license by [employer] (which disclaims liability or warranty for this information). This care instruction is for use  with your licensed healthcare professional. If you have questions about a medical condition or this instruction, always ask your healthcare professional. [place] any warranty or liability for your use of this information.  Content Version: 10.9.538570; Current as of: November 27, 2013    Go to ER if notice SOB that worsens with exertion or persists.  If you have chest discomfort "feeling like an elephant is sitting on your chest".   If it travels into the neck or down the arm.  If you break out in a cold clammy sweat with the chest discomfort.  Chew 2 baby aspirin and dial 911.  Do not drive yourself to the ER.          Starting a Weight Loss Plan: Care Instructions  Your Care Instructions  If you are thinking about losing weight, it can be hard to know where to start. Your doctor can help you set up a weight loss plan that best meets your needs. You may want to take a class on nutrition or exercise, or join a weight loss support group. If you have questions about how to make changes to your eating or exercise habits, ask your doctor about seeing a registered dietitian or an exercise specialist.  It can be a big challenge to lose weight. But you do not have to make huge changes at once. Make small changes, and stick with them. When those changes become habit, add a few more changes.  If you do not think you are ready to make changes right now, try to pick a date in the future. Make an appointment to see your doctor to discuss whether the time is right for you to start a plan.  Follow-up care is a key part of your treatment and safety. Be sure to make and go to all appointments, and call your doctor if you are having problems. It???s also a good idea to know your test results and keep a list of the medicines you take.  How can you care for yourself at home?  ?? Set realistic goals. Many people expect to lose much more weight than is likely. A weight loss of 5% to 10% of your body weight may be enough to improve your health.  ?? Get family and friends involved to provide support. Talk to them about why you are trying to lose weight, and ask them to help. They can help by participating in exercise and having meals with you, even if they may be eating something different.  ?? Find what works best for you. If you do not have time or do not like to cook, a program that offers meal replacement bars or shakes may be better  for you. Or if you like to prepare meals, finding a plan that includes daily menus and recipes may be best.  ?? Ask your doctor about other health professionals who can help you achieve your weight loss goals.  ?? A dietitian can help you make healthy changes in your diet.  ?? An exercise specialist or personal trainer can help you develop a safe and effective exercise program.  ?? A counselor or psychiatrist can help you cope with issues such as depression, anxiety, or family problems that can make it hard to focus on weight loss.  ?? Consider joining a support group for people who are trying to lose weight. Your doctor can suggest groups in your area.  Where can you learn more?  Go to StreetWrestling.at  Enter [phone]  in the search box to learn more about "Starting a Weight Loss Plan: Care Instructions."  ?? 2006-2016 Healthwise, Inc[employer]. Care instructions adapted under license by [employer] (which disclaims liability or warranty for this information). This care instruction is for use with your licensed healthcare professional. If you have questions about a medical condition or this instruction, always ask your healthcare professional. [place] any warranty or liability for your use of this information.  Content Version: 10.9.538570; Current as of: February 23, 2014      WEIGHT LOSS PLAN    1.  Read food labels and count calories.  Goal 1000-1200 calories daily for women and 1200-1600 calories daily for men.  Achieve this by decreasing caloric intake by 500 calories by weekly increments.  NOTE:  1 pound = 3500 calories!  Www.loseit.com  BikingRewards.pl  Www.sparkpeople.com  Www.healthfinder.gov  Weight watchers mobile    Calories needed to lose 1-2 pounds a week = 10 x your weight in pounds    2.  Increase water intake.    Per [facility], it is important to drink 1/2 your body weight in ounces of water daily.   Decrease your water consumption 2-3 hours before bedtime to prevent sleep disturbance from frequent urination.    3.  Decrease sugary beverages.  Each can or glass of soda increases your risk of obesity by 60%.  Can lose 10 pounds in a month by avoiding any soda.     12 oz can of soda = 140 calories, 16 oz cup of sweet tea = 200 calories    16 oz orange juice = 200 calories, 10 oz apple juice = 150 calories   32 oz sports drink = 200 calories, 16 oz punch = 240 calories   3.5 oz alcohol = 100-150 calories     4.  Avoid High Fructose Corn Syrup products.  This ingredient makes products highly addictive!    5.  Exercise 30 minutes daily 5 days weekly, minimally.  If you burn 3500 calories that equals a pound!  Use a pedometer to count steps. Visit www.JustKeepMoving.com for a free pedometer and diet recommendations.      Your maximum heart rate = 220 - your age    Never exercise at your maximum heart rate.    See handout for target heart rate.    6.  Decrease carbohydrates (white bread, pasta, rice, potatoes, sweet foods and sweet drinks like soda, tea, coffee, juice and sports drinks).  Increase fiber and protein.    Goal:  50-150 calories daily of carbohydrates     Try CHROMIUM PICONATE 200 MG THREE TIMES DAILY,    to decrease Premenstrual carbohydrate cravings.    7.  Eat 3-5 small meals daily, include lots of protein (beans/legumes, nuts, lean meat, eggs) and green vegetables with each.  (Breakfast, lunch, dinner with 2 healthy snacks)    8.  Get proper rest 7-8 hours uninterrupted.  When you get less than 6 hours, it triggers hunger by affecting your [place]:Leptin ratio and this results in weight gain.    9.  Watch your food portions.  Green leafy vegetables should cover 1/2 of the plate, lean meat 1/4 of the plate, starchy vegetable 1/4 of the plate.  Use smaller plates.    10.  Do not eat until you are full.  Eat until you are no longer hungry.   If you are not sure, try drinking a glass of water before getting your  second serving of food.    11.  Do not weigh yourself daily.  Wait until your next office visit.  Use how you feel and  how your clothes fit as measurements of success.    12.  Address your spirituality to draw strength from above during your journey.  Remember "I am fearfully and wonderfully made.  Marvelous in His eyes."    13.  Set realistic, appropriate and achievable weight loss goals:     RECOMMENDED TARGET WEIGHT LOSS:  Initial weight loss of 5-10% of your initial body weight achieved over 6 months or a decrease of 2 BMI units.     MINIMUM GOAL OF WEIGHT LOSS:  Reduce body weight and maintain a lower body weight.  Prevent weight gain.          RELATED WEBSITES:      Www.obesityaction.org  (and consider joining the Obesity Action Coalition for $25/year.  The [facility] mission is to elevated and empower those affected by obesity through education, advocacy and support.  Quarterly journals included in membership fee.)    Www.thebiggestloser.com  PatentHood.ch     RELATED DIETS:  Dr. Allene Dillon Million Pound Weight loss challenge, Mediterranean diet, [place], [employer] Watchers, Paleo Diet (anti-inflammatory diet)        EXERCISE 150 MINUTES WEEKLY.  THIS CAN BE ACHIEVED BY WORKING OUT OR WALKING A MINIMUM OF 30 MINUTES FOR 5 DAYS WEEKLY.  YOU CAN EXERCISE IN INCREMENTS OF 10-15 MINUTES UP TO 3 TIMES A DAY.    Consider performing "brainless exercise."  Choose your favorite tv program.  Five minutes before and for 5 minutes after the tv program, stretch your body.  While the program is on, walk in place watching the show.  When commercials come on, rest or walk around the house to do other things.  When the program begins, return to walking in place.  When you are able keep walking during the commercials and add light weights to your ankles or hands.  By the end of the show, you would have walked 30  minutes.  If you need shorter spurts of exercise, walk during the commercials and rest during the show.    Drink to glasses of water prior to any exercise to prevent dehydration and to improve the results of the work out.      Your RESTING HEART RATE is the number of times your heart beats per minute when you are not exerting yourself.  The more fit you are, the lower your resting heart rate will be.    Your MAXIMUM HEART RATE is the number of times per minute your heart pumps when it is working at 100% capacity.  NEVER EXERCISE AT YOUR MAXIMUM HEART RATE.    MAX HEART RATE = 220 - your age    For example, in a 48 year old, the maximum heart rate is 220-50 = 170 beats per minute    Your [place] is the number of beats per minute our heart should pump during aerobic exercise.  Reaching your target heart rate indicates that your body is receiving maximum cardiovascular and fat burning benefits.     If you are fit, your TARGET HEART RATE = 70-85% of your maximum Heart rate      For a 47 year old with vigorous intensity physical activity,         Target heart rate= 170 bpm x .70 = 119 bpm     Target heart rate =  170 bpm x .85=  145 bpm        Therefore, your target heart rate during physical activity is 119-145      If you are not fit, TARGET HEART RATE = 50-70% of your maximum Heart rate    MODERATE CONSISTENT AEROBIC EXERCISE OR WALKING FOR AT LEAST 150 MINUTES WEEKLY IS AN ESSENTIAL PART OF ANY WEIGHT LOSS OF WEIGHT MAINTENANCE PROGRAM.     During WEIGHT LOSS PHASE, at least 75% of your exercise needs to be walking or moderate aerobic activity and 25% or 0% can be Isometric or Resistance type exercises (the latter can be deferred to the weight maintenance phase.)     During WEIGHT MAINTENANCE PHASE, at least 50% of your exercise needs to be aerobic and 50% Isometric or Resistance type exercises.     BENEFITS OF MODERATE INTENSITY EXERCISE MOST DAYS OF THE WEEK:     1.  Insulin Resistance improves 30-85%    2.  Abdominal Fat decreases by 30%   3.  Inflammatory Markers decrease by 30% (therefore pain decreases)   4.  Systolic and Diastolic Blood Pressure decrease by 4 mmHg   5.  HDL improves by 5%   6.  Triglycerides decrease by 15%   7.  Shift from small dense LDL to large dense LDL (therefore decrease Insulin Resistance)   8.  Decrease coagulability                 HEARTBURN occurs when stomach acid moves back up through your esophagus.  Several conditions and foods can contribute to this process.  Common causes include:    Hiatal Hernia  Pregnancy  Alcohol use  Overweight or Obesity  Smoking.    Consuming certain types of foods or drinks can increase the acid content of the stomach and cause heartburn.  AVOID THESE TRIGGERS:     Stress  Alcoholic or carbonated beverages  Caffeine (coffee, tea, chocolate, soda)  Acidic foods or drinks (Oranges or orange juice, apples, apple juice, grapefruit or grapefruit juice, bananas, grape juice)  Tomatoes or tomato based foods (pizza, spaghetti, chili)  Greasy, Fatty or Fried foods  Spicy Foods (including hot sauce)  Garlic  Onions  Mint flavoring (peppermint, speramint, cinnamon).    PREVENTIVE MEASURES    Do not wear tight, restrictive clothing.  Lose weight.  Elevate the head of the bed 4 to 6 inches with blocks or a wedge pillow.  Wait 2 to 3 hours after a meal before lying down. Avoid the triggers.    MEDICATIONS     Try over the counter medications if needed first.  These include:  TUMS, ROLAIDS, Mylanta, Prilosec 20 mg, Pepcid 20 mg, Tagamet, Zantac up to 150 mg twice daily or 300 mg daily, Nexium 20 mg up to 40 mg daily and Prevacid up to 30 mg daily.  Ideally, take for 2 weeks after symptoms consistently appear.      NOTIFY OUR OFFICE    If symptoms worsen or persist.   If breathing or swallowing becomes difficult.   If you regurgitate blood.    DIAL 911 IF THE HEART BURN SYMPTOMS ARE ACCOMPANIED BY    Shortness of breath, sweating, pain in the jaw, neck or arm, cold clammy feeling with nausea or vomiting.  This could be a HEART ATTACK.    Gastritis: Care Instructions  Your Care Instructions     Gastritis is a sore and upset stomach. It happens when something irritates the stomach lining.  Many things can cause it. These include an infection such as the flu or something you ate or drank. Medicines or a sore on the lining of the stomach (ulcer) also can cause it. Your belly may bloat and ache. You may belch, vomit, and feel sick to your stomach.  You should be able to relieve the problem by taking medicine. And it may help to change your diet. If gastritis lasts, your doctor may prescribe medicine.  Follow-up care is a key part of your treatment and safety. Be sure to make and go to all appointments, and call your doctor if you are having problems. It's also a good idea to know your test results and keep a list of the medicines you take.  How can you care for yourself at home?  ?? If your doctor prescribed antibiotics, take them as directed. Do not stop taking them just because you feel better. You need to take the full course of antibiotics.  ?? Be safe with medicines. If your doctor prescribed medicine to decrease stomach acid, take it as directed. Call your doctor if you think you are having a problem with your medicine.  ?? Do not take any other medicine, including over-the-counter pain relievers, without talking to your doctor first.  ?? If your doctor recommends over-the-counter medicine to reduce stomach acid, such as Pepcid AC, Prilosec, Tagamet HB, or Zantac 75, follow the directions on the label.  ?? Drink plenty of fluids (enough so that your urine is light yellow or clear like water) to prevent dehydration. Choose water and other caffeine-free clear liquids. If you have kidney, heart, or liver disease and have to limit fluids, talk with your doctor before you increase the amount of fluids you drink.   ?? Limit how much alcohol you drink.  ?? Avoid coffee, tea, cola drinks, chocolate, and other foods with caffeine. They increase stomach acid.  When should you call for help?  Call 911 anytime you think you may need emergency care. For example, call if:  ?? You vomit blood or what looks like coffee grounds.  ?? You pass maroon or very bloody stools.  Call your doctor now or seek immediate medical care if:  ?? You start breathing fast and have not produced urine in the last 8 hours.  ?? You cannot keep fluids down.  Watch closely for changes in your health, and be sure to contact your doctor if:  ?? You do not get better as expected.  Where can you learn more?  Go to [email or web address].at  Enter Z536 in the search box to learn more about "Gastritis: Care Instructions."  ?? 2006-2016 Healthwise, Incorporated. Care instructions adapted under license by [employer] (which disclaims liability or warranty for this information). This care instruction is for use with your licensed healthcare professional. If you have questions about a medical condition or this instruction, always ask your healthcare professional. [place] any warranty or liability for your use of this information.  Content Version: 10.9.538570; Current as of: November 27, 2013        Gastritis: Care Instructions  Your Care Instructions     Gastritis is a sore and upset stomach. It happens when something irritates the stomach lining. Many things can cause it. These include an infection such as the flu or something you ate or drank. Medicines or a sore on the lining of the stomach (ulcer) also can cause it. Your belly may bloat and ache. You may belch, vomit, and  feel sick to your stomach.  You should be able to relieve the problem by taking medicine. And it may help to change your diet. If gastritis lasts, your doctor may prescribe medicine.   Follow-up care is a key part of your treatment and safety. Be sure to make and go to all appointments, and call your doctor if you are having problems. It's also a good idea to know your test results and keep a list of the medicines you take.  How can you care for yourself at home?  ?? If your doctor prescribed antibiotics, take them as directed. Do not stop taking them just because you feel better. You need to take the full course of antibiotics.  ?? Be safe with medicines. If your doctor prescribed medicine to decrease stomach acid, take it as directed. Call your doctor if you think you are having a problem with your medicine.  ?? Do not take any other medicine, including over-the-counter pain relievers, without talking to your doctor first.  ?? If your doctor recommends over-the-counter medicine to reduce stomach acid, such as Pepcid AC, Prilosec, Tagamet HB, or Zantac 75, follow the directions on the label.  ?? Drink plenty of fluids (enough so that your urine is light yellow or clear like water) to prevent dehydration. Choose water and other caffeine-free clear liquids. If you have kidney, heart, or liver disease and have to limit fluids, talk with your doctor before you increase the amount of fluids you drink.  ?? Limit how much alcohol you drink.  ?? Avoid coffee, tea, cola drinks, chocolate, and other foods with caffeine. They increase stomach acid.  When should you call for help?  Call 911 anytime you think you may need emergency care. For example, call if:  ?? You vomit blood or what looks like coffee grounds.  ?? You pass maroon or very bloody stools.  Call your doctor now or seek immediate medical care if:  ?? You start breathing fast and have not produced urine in the last 8 hours.  ?? You cannot keep fluids down.  Watch closely for changes in your health, and be sure to contact your doctor if:  ?? You do not get better as expected.  Where can you learn more?  Go to [email or web address]   Enter Z536 in the search box to learn more about "Gastritis: Care Instructions."  ?? 2006-2016 Healthwise, Incorporated. Care instructions adapted under license by [employer] (which disclaims liability or warranty for this information). This care instruction is for use with your licensed healthcare professional. If you have questions about a medical condition or this instruction, always ask your healthcare professional. [place] any warranty or liability for your use of this information.  Content Version: 10.9.538570; Current as of: November 27, 2013    HEARTBURN occurs when stomach acid moves back up through your esophagus.  Several conditions and foods can contribute to this process.  Common causes include:    Hiatal Hernia  Pregnancy  Alcohol use  Overweight or Obesity  Smoking.    Consuming certain types of foods or drinks can increase the acid content of the stomach and cause heartburn.  AVOID THESE TRIGGERS:     Stress  Alcoholic or carbonated beverages  Caffeine (coffee, tea, chocolate, soda)  Acidic foods or drinks (Oranges or orange juice, apples, apple juice, grapefruit or grapefruit juice, bananas, grape juice)  Tomatoes or tomato based foods (pizza, spaghetti, chili)  Greasy, Fatty or Fried foods  Spicy Foods (  including hot sauce)  Garlic  Onions  Mint flavoring (peppermint, speramint, cinnamon).    PREVENTIVE MEASURES    Do not wear tight, restrictive clothing.  Lose weight.  Elevate the head of the bed 4 to 6 inches with blocks or a wedge pillow.  Wait 2 to 3 hours after a meal before lying down. Avoid the triggers.    MEDICATIONS     Try over the counter medications if needed first.  These include:  TUMS, ROLAIDS, Mylanta, Prilosec 20 mg, Pepcid 20 mg, Tagamet, Zantac up to 150 mg twice daily or 300 mg daily, Nexium 20 mg up to 40 mg daily and Prevacid up to 30 mg daily.  Ideally, take for 2 weeks after symptoms consistently appear.      NOTIFY OUR OFFICE     If symptoms worsen or persist.   If breathing or swallowing becomes difficult.   If you regurgitate blood.    DIAL 911 IF THE HEART BURN SYMPTOMS ARE ACCOMPANIED BY   Shortness of breath, sweating, pain in the jaw, neck or arm, cold clammy feeling with nausea or vomiting.  This could be a HEART ATTACK.      Gastritis: Care Instructions  Your Care Instructions     Gastritis is a sore and upset stomach. It happens when something irritates the stomach lining. Many things can cause it. These include an infection such as the flu or something you ate or drank. Medicines or a sore on the lining of the stomach (ulcer) also can cause it. Your belly may bloat and ache. You may belch, vomit, and feel sick to your stomach.  You should be able to relieve the problem by taking medicine. And it may help to change your diet. If gastritis lasts, your doctor may prescribe medicine.  Follow-up care is a key part of your treatment and safety. Be sure to make and go to all appointments, and call your doctor if you are having problems. It's also a good idea to know your test results and keep a list of the medicines you take.  How can you care for yourself at home?  ?? If your doctor prescribed antibiotics, take them as directed. Do not stop taking them just because you feel better. You need to take the full course of antibiotics.  ?? Be safe with medicines. If your doctor prescribed medicine to decrease stomach acid, take it as directed. Call your doctor if you think you are having a problem with your medicine.  ?? Do not take any other medicine, including over-the-counter pain relievers, without talking to your doctor first.  ?? If your doctor recommends over-the-counter medicine to reduce stomach acid, such as Pepcid AC, Prilosec, Tagamet HB, or Zantac 75, follow the directions on the label.  ?? Drink plenty of fluids (enough so that your urine is light yellow or clear like water) to prevent dehydration. Choose water and other  caffeine-free clear liquids. If you have kidney, heart, or liver disease and have to limit fluids, talk with your doctor before you increase the amount of fluids you drink.  ?? Limit how much alcohol you drink.  ?? Avoid coffee, tea, cola drinks, chocolate, and other foods with caffeine. They increase stomach acid.  When should you call for help?  Call 911 anytime you think you may need emergency care. For example, call if:  ?? You vomit blood or what looks like coffee grounds.  ?? You pass maroon or very bloody stools.  Call your  doctor now or seek immediate medical care if:  ?? You start breathing fast and have not produced urine in the last 8 hours.  ?? You cannot keep fluids down.  Watch closely for changes in your health, and be sure to contact your doctor if:  ?? You do not get better as expected.  Where can you learn more?  Go to [email or web address].at  Enter Z536 in the search box to learn more about "Gastritis: Care Instructions."  ?? 2006-2016 Healthwise, Incorporated. Care instructions adapted under license by [employer] (which disclaims liability or warranty for this information). This care instruction is for use with your licensed healthcare professional. If you have questions about a medical condition or this instruction, always ask your healthcare professional. [place] any warranty or liability for your use of this information.  Content Version: 10.9.538570; Current as of: November 27, 2013

## 2014-09-25 LAB — MICROALBUMIN, UR, RAND W/ MICROALB/CREAT RATIO
Creatinine, urine random: 59.8 mg/dL
Microalb/Creat ratio (ug/mg creat.): 6 mg/g creat (ref 0.0–30.0)
Microalbumin, urine: 3.6 ug/mL

## 2014-09-27 NOTE — Telephone Encounter (Signed)
Patient calling for the results of her recent stress echo. Can be reached at [phone]. Thanks!

## 2014-09-27 NOTE — Telephone Encounter (Signed)
I returned her call and left a messaged regarding her stress echo result.

## 2014-10-29 ENCOUNTER — Encounter: Attending: Family Medicine | Primary: Family Medicine

## 2014-11-03 NOTE — Progress Notes (Signed)
Pt notified of results by My Chart.

## 2014-12-01 ENCOUNTER — Ambulatory Visit
Admit: 2014-12-01 | Discharge: 2014-12-01 | Payer: PRIVATE HEALTH INSURANCE | Attending: Family Medicine | Primary: Family Medicine

## 2014-12-01 DIAGNOSIS — I10 Essential (primary) hypertension: Secondary | ICD-10-CM

## 2014-12-01 MED ORDER — ALBUTEROL SULFATE HFA 90 MCG/ACTUATION AEROSOL INHALER
90 mcg/actuation | RESPIRATORY_TRACT | 5 refills | Status: AC | PRN
Start: 2014-12-01 — End: ?

## 2014-12-01 NOTE — Progress Notes (Signed)
Chief Complaint   Patient presents with   ??? Results   ??? Shortness of Breath     1. Have you been to the ER, urgent care clinic since your last visit?  Hospitalized since your last visit?No    2. Have you seen or consulted any other health care providers outside of the [place] since your last visit?  Include any pap smears or colon screening. No    ACP booklet given and discussed with patient at a previous appointment.     The patient was counseled on the dangers of tobacco use, and was Patient doesn't smoke.  Reviewed strategies to maximize success, including Patient doesn't smoke.

## 2014-12-01 NOTE — Progress Notes (Signed)
HISTORY OF PRESENT ILLNESS  Katrina [name] is a 49 y.o. female presents with Results; Shortness of Breath; and Referral Follow Up    Agree with nurse note.    Hypertensive pt with hypothyroidism, Vitamin D deficiency, and hx of chest pain presents to the office with a BP of 138/83. Her stress echo on 09/23/14 showed an EF of 60% and was read as a normal study. She did not need to follow up with cardiologist, Dr. Shelly Rubenstein because it was normal. She has had no recurrence of chest pain but reports that she notices herself catching her breath while exercising. I ordered an ABI in 09/2014 and no one from scheduling called her so she has not had that performed. She continues having leg pain while going up the stairs and has a family hx of DVT. No SOB, chest pain, swelling or other associated symptoms.    Pt brought a copy of her labs drawn on 06/17/14 at [facility] and would like to discuss them. Her glucose was 111. Her creatinine was 0.59. Her ALT was 56. Her WBC count was 8.5. Her INR was 1.0. On 09/24/14, her urine microalbumin was 3.6. On 09/17/14, her Hgb A1C was 5.9. She takes Metformin ER 1,000 mg BID for DM, tolerating well.     Pt weighs 207 lbs, gained about 1 lb since last ov, which she attributes to being in school and working full time. She weighed 185 lbs before starting school. She wants to start exercising 2-3x weekly x30 minutes. She participated in an 8k earlier this month. She is sleeping x4.5 hours and feels refreshed most of the times in the morning. She uses a Fitbit to track her sleep and logs atleast 10,000 steps daily. For breakfast, she has a protein shake with veggies. For lunch, she eats a salad, grilled salmon, grilled cheese, or a cup of soup. She notices she bloats whenever she eats foods with gluten.     [name]. She does not use CPAP and previously saw sleep specialist, Dr. Dineen Kid. She was told the use CPAP but that she could stop once  she lost weight. She denies waking up gasping for air.     Pt saw pulmonologist, Dr. Lendell Caprice on 11/02/14. He noted her chest xray was normal and he will empirically tx her for asthma. He noted her 20 lb weight gain has undoubtedly contributed to her SOB. Dx'd mild intermittent asthma. He started her on Symbicort 160-4.5 mcg/actuation inhaler 2 puffs daily. F/U 4 months. She has not filled Symbicort and received a letter from her insurance regarding why she needs it. Overall, her SOB has improved since her last ov.     Pt is followed by ophthalmologist, Dr. Eli Phillips. She is going to see him in 12/2014.     Written by Lyndee Hensen, scribe, as dictated by Dr. Felix Ahmadi, DO.    ROS    Review of Systems negative except as noted above in HPI.    ALLERGIES:    Allergies   Allergen Reactions   ??? Aldactone [Spironolactone] Swelling   ??? Codeine Rash and Itching   ??? Paxil [Paroxetine Hcl] Other (comments)     WORSENED DEPRESSION.       CURRENT MEDICATIONS:    Outpatient Prescriptions Marked as Taking for the 12/01/14 encounter (Office Visit) with Talbert Cage, DO   Medication Sig Dispense Refill   ??? aspirin delayed-release 81 mg tablet Take  by mouth daily.     ???  albuterol (PROVENTIL HFA, VENTOLIN HFA, PROAIR HFA) 90 mcg/actuation inhaler Take 2 Puffs by inhalation every four (4) hours as needed for Shortness of Breath. Indications: BRONCHOSPASM PREVENTION 1 Inhaler 5   ??? levothyroxine (SYNTHROID) 88 mcg tablet TAKE 1 TABLET DAILY BEFORE BREAKFAST FOR HYPOTHYROIDISM 90 Tab 0   ??? sertraline (ZOLOFT) 100 mg tablet Take 1 Tab by mouth daily. Indications: MAJOR DEPRESSIVE DISORDER 90 Tab 3   ??? cholecalciferol, vitamin D3, (VITAMIN D3) 2,000 unit tab Take 4,000 Units by mouth daily.     ??? metFORMIN ER (GLUCOPHAGE XR) 500 mg tablet Take 2 Tabs by mouth Before breakfast and dinner. Indications: TYPE 2 DIABETES MELLITUS 360 Tab 3   ??? b complex vitamins tablet Take 1 Tab by mouth daily.        ??? ascorbic acid (VITAMIN C) 500 mg tablet Take 500 mg by mouth daily.     ??? vitamin E (AQUA GEMS) 400 unit capsule Take 400 Units by mouth daily.         PAST MEDICAL HISTORY:    Past Medical History   Diagnosis Date   ??? Abnormal gall bladder diagnostic imaging 07/2000     Abnormal HIDA.  Dr. Tomasita Morrow.   ??? Adrenal adenoma 06/2009     Dr. Mcneil Sober, nephro.  Dr. Boston Service, surgeon.   ??? 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  Dr. Gerilyn Nestle.   ??? Chest pain of uncertain etiology [id number]     Dr. Sinclair Ship.  Normal Stress Echo 12/26/09 and 09/23/14.  Dr. Maurene Capes.   ??? Chronic insomnia 2009, 2011     Dr. Dineen Kid.   ??? Dyslipidemia Apr 25, 2007     Improving with weight loss   ??? Essential hypertension 04-25-95     Dr. Mcneil Sober, nephro.  Dr. Eli Phillips, ophth.   ??? Family history of breast cancer in mother 75     MRI, 6 mo.  Dr. Gerre Pebbles   ??? Fatty liver 07/25/2007     with elevated AST, ALT.  In NASH study at [facility] (Dr. Hilary Hertz)   ??? Heartburn 07/25/2007     Has improved with weight loss   ??? 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 Major depression, recurrent ([employer]) 04-25-90, 1995-04-25, April 25, 2007, 05/2013     situational.  fiance's death (1995/04/25). Clydell Hakim, counselor.    ??? Obesity 07/25/2007     Improved with Lifestyle Modifications   ??? OSA (obstructive sleep apnea) 11/2010     Mild.  Txd CPAP 8 cm H20.  Dr. Bretta Bang.  stopped 11/2011.   ??? Osteoarthritis of lumbar spine      MRI 12/14   ??? Osteoarthritis of thoracic spine      MRI 12/14, Stable   ??? Type 2 diabetes, diet controlled ([place]) 04/25/03   ??? Vitamin D deficiency 05/2010       PAST SURGICAL HISTORY:    Past Surgical History   Procedure Laterality Date   ??? Pr appendectomy  07/2000     Dr. Tomasita Morrow.   ??? Hx tonsillectomy  April 24, 1972   ??? Hx breast reduction  1989     Bilaterally.  Dr. Ashby Dawes.    ??? Hx cholecystectomy  07/2000     Gallbladder malfunctioning.  Dr. Tomasita Morrow.   ??? Hx other surgical Right 08/01/09      LAP ADRENALECTOMY.  due to benign adrenal adenoma/tumor.  Dr. Boston Service.   ??? Hx other surgical  2008, 2013     LIVER BIOPSY X 2.         FAMILY HISTORY:    Family History   Problem Relation Age of Onset   ??? Hypertension Mother    ??? Breast Cancer Mother 63   ??? Stroke Mother 75   ??? Anxiety Mother    ??? Depression Mother    ??? Diabetes Father    ??? Hypertension Father    ??? Kidney Disease Father      reversed   ??? High Cholesterol Father    ??? Stroke Sister 21     Carotid Artery Dissection   ??? Other Sister      psedotumor cerebri   ??? Diabetes Paternal Grandmother    ??? Hypertension Paternal Grandmother    ??? Cancer Paternal Grandmother      ?LEUKEMIA   ??? Hypertension Paternal Grandfather    ??? High Cholesterol Paternal Grandfather    ??? Diabetes Maternal Grandmother    ??? Hypertension Maternal Grandmother    ??? Diabetes Maternal Grandfather    ??? Hypertension Maternal Grandfather    ??? Cancer Paternal Uncle      PANCREATIC   ??? Cancer Maternal Uncle      PANCREATIC       SOCIAL HISTORY:    Social History     Social History   ??? Marital status: SINGLE     Spouse name: Single   ??? Number of children: 0   ??? Years of education: N/A     Occupational History   ??? [employer]     Social History Main Topics   ??? Smoking status: Former Smoker     Packs/day: 0.30     Years: 10.00     Types: Cigarettes     Quit date: 02/08/2009   ??? Smokeless tobacco: Never Used   ??? Alcohol use 0.5 oz/week     1 Glasses of wine per week   ??? Drug use: No   ??? Sexual activity: Yes     Other Topics Concern   ??? None     Social History Narrative       IMMUNIZATIONS:    Immunization History   Administered Date(s) Administered   ??? Hep B Vaccine 06/08/2000   ??? Influenza Vaccine 10/14/2013   ??? Influenza Vaccine Split 10/24/2010   ??? Pneumococcal Polysaccharide (PPSV-23) 12/22/2010   ??? Pneumococcal Vaccine (Unspecified Type) 12/05/2005   ??? TD Vaccine 10/23/2000   ??? TDAP Vaccine 10/24/2010         PHYSICAL EXAMINATION    Vital Signs    Visit Vitals    ??? BP 138/83 (BP 1 Location: Left arm, BP Patient Position: Sitting)   ??? Pulse 74   ??? Temp 99 ??F (37.2 ??C) (Oral)   ??? Resp 18   ??? Ht 5\' 5"  (1.651 m)   ??? Wt 207 lb 9.6 oz (94.2 kg)   ??? LMP 11/01/2014   ??? SpO2 98%   ??? BMI 34.55 kg/m2       Weight Metrics 12/01/2014 09/24/2014 04/28/2014 02/26/2014 02/08/2014 11/09/2013 08/04/2013   Weight 207 lb 9.6 oz 206 lb 11.2 oz 203 lb 9.6 oz 197 lb 12.8 oz 199 lb 3.2 oz 191 lb 190 lb   BMI 34.55 kg/m2 34.4 kg/m2 33.88 kg/m2 32.92 kg/m2 33.15 kg/m2 31.78 kg/m2 31.62 kg/m2       General appearance - Well nourished. Well appearing.  Well developed.  No acute distress. Overweight.   Head -  Normocephalic.  Atraumatic.    Eyes - pupils equal and reactive. Extraocular eye movements intact. Sclera anicteric.  Mildly injected sclera.  Ears - Hearing is grossly normal bilaterally.      Nose - normal and patent.  No polyps noted.  No erythema.  No discharge.    Mouth - mucous membranes with adequate moisture.  Posterior pharynx normal with cobblestone appearance.  No erythema, white exudate or obstruction.  Neck - supple.  Midline trachea.  No carotid bruits noted bilaterally.  No thyromegaly noted.  Chest - clear to auscultation bilaterally anteriorly and posteriorly.  No wheezes.  No rales or rhonchi.  Breath sounds are symmetrical bilaterally.  Unlabored respirations.  Heart - normal rate.  Regular rhythm.  Normal S1, S2.  No murmur noted.  No rubs, clicks or gallops noted.  Abdomen - soft and distended.  No masses or organomegaly.  No rebound, rigidity or guarding.  Bowel sounds normal x 4 quadrants.  No tenderness noted.  Neurological - awake, alert and oriented to person, place, and time and event.  Cranial nerves II through XII intact.  Clear speech.  Muscle strength is +5/5 x 4 extremities.  Sensation is intact to light touch bilaterally.  Steady gait.   Heme/Lymph - peripheral pulses normal x 4 extremities.  No peripheral edema is noted.     Musculoskeletal - Intact x 4 extremities.  Full ROM x 4 extremities.  No pain with movement.    Back exam - normal range of motion.  No pain on palpation of the spinous processes in the cervical, thoracic, lumbar, sacral regions.  No CVA tenderness.    Skin - no rashes, erythema, ecchymosis, lacerations, abrasions, suspicious moles noted  Psychological -   normal behavior, dress and thought processes.  Good insight. Good eye contact.  Normal affect.  Appropriate mood.  Normal speech.    DATA REVIEWED    Results for orders placed or performed in visit on 09/24/14   MICROALBUMIN, UR, RAND W/ MICROALBUMIN/CREA RATIO   Result Value Ref Range    Creatinine, urine 59.8 Not Estab. mg/dL    Microalbumin, urine 3.6 Not Estab. ug/mL    Microalb/Creat ratio (ug/mg creat.) 6.0 0.0 - 30.0 mg/g creat     Lab Results   Component Value Date/Time    HEMOGLOBIN A1C 5.9 09/17/2014 01:08 PM    HEMOGLOBIN A1C 6.3 05/30/2010 10:33 AM    HEMOGLOBIN A1C (POC) 5.1 12/25/2011 10:19 AM       SEE SCANNED DOCUMENTS.    ASSESSMENT and PLAN      ICD-10-CM ICD-9-CM    1. Essential hypertension I10 401.9 aspirin delayed-release 81 mg tablet   2. Mild intermittent asthma without complication [id number] [id number] albuterol (PROVENTIL HFA, VENTOLIN HFA, PROAIR HFA) 90 mcg/actuation inhaler   3. Obesity, diabetes, and hypertension syndrome (HCC) E11.9 250.00     I10 401.9     E66.9 278.00    4. Inadequate sleep hygiene Z72.821 307.49    5. Hypothyroidism due to acquired atrophy of thyroid E03.4 244.8      246.8    6. OSA (obstructive sleep apnea) G47.33 327.23     without CPAP use   7. Vitamin D deficiency E55.9 268.9    8. Other chest pain R07.89 786.59     due to pulm vs cardio, improving   9. Pain in both lower extremities M79.604 729.5     M79.605     10. Family history of DVT Z82.49 V17.49  sister   35. SOB (shortness of breath) R06.02 786.05     due to Asthma vs cardio vs other       Discussed the patient's BMI with her.  The BMI follow up plan is as  follows: BMI is out of normal parameters and plan is as follows: I have counseled this patient on diet and exercise regimens.  Addressed weight, diet and exercise with patient.  Decrease carbohydrates (white foods, sweet foods, sweet drinks and alcohol), increase green leafy vegetables and protein (lean meats and beans) with each meal.  Avoid fried foods. Eat 3-5 small meals daily.  Do not skip meals.  Increase water intake.    Increase physical activity to 30 minutes daily for health benefit or 60 minutes daily to prevent weight regain, as tolerated.  Get 7-8 hours uninterrupted sleep nightly.  Chart reviewed and updated.      Reprinted ABI order.   Continue current medications and care.  Most recent tests reviewed from 06/2014 and 09/2014.  Recent office visit notes from Dr. Raynelle Dick reviewed.  Get notes from Dr. Curt Bears. Advised pt to sign release.   Counseled patient on health concerns:  Chest pain - signs of MI, dial 911 if present.  Leg pain/signs of DVT - to ER if present, weight management, asthma plan, sleep hygiene, and DM care.  Immunizations noted. Pt received her flu vaccine at work.   Offered empathy, support, legitimation, prayers, partnership to patient.  Praised patient for progress.  Follow-up Disposition:  Return in about 6 months (around 05/31/2015) for dm, bp, referral follow up.    Patient was offered a choice/choices in the treatment plan today.  Patient expresses understanding of the plan and agrees with recommendations.    Patient declines any additional handouts.  Patient is satisfied with previous handouts received from our office    Written by Lyndee Hensen, scribe, as dictated by Dr. Felix Ahmadi, DO.      Documentation True and Accepted by Chaynce Schafer R. Lorel Monaco, D.O.

## 2014-12-06 MED ORDER — LEVOTHYROXINE 88 MCG TAB
88 mcg | ORAL_TABLET | ORAL | 3 refills | Status: DC
Start: 2014-12-06 — End: 2015-03-09

## 2014-12-06 MED ORDER — METFORMIN SR 500 MG 24 HR TABLET
500 mg | ORAL_TABLET | ORAL | 3 refills | Status: AC
Start: 2014-12-06 — End: ?

## 2014-12-21 ENCOUNTER — Inpatient Hospital Stay: Admit: 2014-12-21 | Payer: BLUE CROSS/BLUE SHIELD | Attending: Family Medicine | Primary: Family Medicine

## 2014-12-21 DIAGNOSIS — I739 Peripheral vascular disease, unspecified: Secondary | ICD-10-CM

## 2014-12-22 NOTE — Procedures (Signed)
[facility]  *** FINAL REPORT ***    Name: LAVRA, CERAR  MRN: [id number]    Outpatient  DOB: 22-Sep-1965  HIS Order #: [id number]  [place] Visit #: [id number]  Date: 21 Dec 2014    TYPE OF TEST: Peripheral Arterial Testing    REASON FOR TEST  Claudication (both sides)    Right Leg  Doppler:  PVR:  Ankle/Brachial: 1.17    Left Leg  Doppler:  PVR:  Ankle/Brachial: 1.33    INTERPRETATION/FINDINGS  PROCEDURE:  Evaluation of lower extremity arteries with systolic blood   pressure measurement at the ankle and brachial level and calculation  of the ankle/brachial pressure index (ABI).  Unless otherwise  indicated, the exam also includes pulse volume recording (PVR)  plethysmography at the ankle level.    FINDINGS:  ABI is 1.17 on the right and 1.33 on the left.  PVR  waveforms are normal at the right ankle and normal at the left ankle.   Left ABI may be mildly elevated due to diabetic arterial  calcfication.  Bilateral great toe pressures are within normal limits.    IMPRESSION:  There is no evidence of hemodynamically significant lower   extremity arterial obstruction.    ADDITIONAL COMMENTS    I have personally reviewed the data relevant to the interpretation of  this  study.    TECHNOLOGIST: Lilli Light, RVT  Signed: 12/21/2014 05:01 PM    PHYSICIAN: Vernard Gambles., MD  Signed: 12/22/2014 07:01 AM

## 2014-12-22 NOTE — Procedures (Signed)
[facility]  *** FINAL REPORT ***    Name: Katrina [name], Katrina [name]  MRN: [id number]    Outpatient  DOB: 1965/10/23  HIS Order #: [id number]  [place] Visit #: [id number]  Date: 21 Dec 2014    TYPE OF TEST: Peripheral Arterial Testing    REASON FOR TEST  Claudication (both sides)    Right Leg  Doppler:  PVR:  Ankle/Brachial: 1.17    Left Leg  Doppler:  PVR:  Ankle/Brachial: 1.33    INTERPRETATION/FINDINGS  PROCEDURE:  Evaluation of lower extremity arteries with systolic blood   pressure measurement at the ankle and brachial level and calculation  of the ankle/brachial pressure index (ABI).  Unless otherwise  indicated, the exam also includes pulse volume recording (PVR)  plethysmography at the ankle level.    FINDINGS:  ABI is 1.17 on the right and 1.33 on the left.  PVR  waveforms are normal at the right ankle and normal at the left ankle.   Left ABI may be mildly elevated due to diabetic arterial  calcfication.  Bilateral great toe pressures are within normal limits.    IMPRESSION:  There is no evidence of hemodynamically significant lower   extremity arterial obstruction.    ADDITIONAL COMMENTS    I have personally reviewed the data relevant to the interpretation of  this  study.    TECHNOLOGIST: Lilli Light, RVT  Signed: 12/21/2014 05:01 PM    PHYSICIAN: Vernard Gambles., MD  Signed: 12/22/2014 07:01 AM

## 2014-12-23 NOTE — Progress Notes (Signed)
Pt notified of results by My Chart.\\

## 2015-01-26 ENCOUNTER — Ambulatory Visit: Admit: 2015-01-26 | Discharge: 2015-01-26 | Payer: PRIVATE HEALTH INSURANCE | Primary: Family Medicine

## 2015-01-26 DIAGNOSIS — E669 Obesity, unspecified: Secondary | ICD-10-CM

## 2015-01-26 NOTE — Progress Notes (Signed)
Patient attended a Medically Supervised Weight Loss New Patient Orientation today where we discussed:  - New Direction Very Low Calorie Diet details  - Medical Supervision  - Nutrition education  - Cost  - Policies and compliance required for program enrollment.     PATIENT CHOSE NOT TO ENROLL AT THIS TIME DUE TO location in [place].  Pt wishes to start the MSWL program with Dr. Lorel Monaco in [place] in February.

## 2015-02-20 ENCOUNTER — Inpatient Hospital Stay
Admit: 2015-02-20 | Discharge: 2015-02-20 | Disposition: A | Discharge status: Home / Self Care | Payer: BLUE CROSS/BLUE SHIELD | Attending: Emergency Medicine

## 2015-02-20 ENCOUNTER — Emergency Department: Admit: 2015-02-20 | Payer: BLUE CROSS/BLUE SHIELD | Primary: Family Medicine

## 2015-02-20 DIAGNOSIS — M25511 Pain in right shoulder: Secondary | ICD-10-CM

## 2015-02-20 NOTE — Other (Signed)
Patient is a 50 y.o. female presenting with shoulder pain. The history is provided by the patient (aching arounf her shoulder and clavicle for about 3 weeks and the pain is progressive.  No injury, Tried nothing yet.  Rt hand dominant).   Shoulder Pain    The incident occurred more than 1 week ago. There was no injury mechanism. The right shoulder is affected. The pain is moderate. The pain has been constant since onset. The pain does not radiate. There is no history of shoulder injury. She has no other injuries. There is no history of shoulder surgery. Pertinent negatives include no numbness, no muscle weakness and no tingling.        Past Medical History   Diagnosis Date   ??? Abnormal gall bladder diagnostic imaging 07/2000     Abnormal HIDA.  Dr. Tomasita Morrow.   ??? Adrenal adenoma 06/2009     Dr. Mcneil Sober, nephro.  Dr. Boston Service, surgeon.   ??? 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  Dr. Gerilyn Nestle.   ??? Chest pain of uncertain etiology [id number]     Dr. Sinclair Ship.  Normal Stress Echo 12/26/09 and 09/23/14.  Dr. Maurene Capes.   ??? Chronic insomnia 2009, 2011     Dr. Dineen Kid.   ??? Dyslipidemia 04/19/2007     Improving with weight loss   ??? Essential hypertension 04/19/1995     Dr. Mcneil Sober, nephro.  Dr. Eli Phillips, ophth.   ??? Family history of breast cancer in mother 98     MRI, 6 mo.  Dr. Gerre Pebbles   ??? Fatty liver 07/25/2007     with elevated AST, ALT.  In NASH study at [facility] (Dr. Hilary Hertz)   ??? Heartburn 07/25/2007     Has improved with weight loss   ??? 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 Major depression, recurrent ([place]) 1990-04-19, 19-Apr-1995, Apr 19, 2007, 05/2013     situational.  fiance's death (04-19-1995). Clydell Hakim, counselor.    ??? Obesity 07/25/2007     Improved with Lifestyle Modifications   ??? OSA (obstructive sleep apnea) 11/2010     Mild.  Txd CPAP 8 cm H20.  Dr. Bretta Bang.  stopped 11/2011.   ??? Osteoarthritis of lumbar spine      MRI 12/14   ??? Osteoarthritis of thoracic spine       MRI 12/14, Stable   ??? Type 2 diabetes, diet controlled ([place]) 04/19/03   ??? Vitamin D deficiency 05/2010        Past Surgical History   Procedure Laterality Date   ??? Pr appendectomy  07/2000     Dr. Tomasita Morrow.   ??? Hx tonsillectomy  04-18-72   ??? Hx breast reduction  1989     Bilaterally.  Dr. Ashby Dawes.    ??? Hx cholecystectomy  07/2000     Gallbladder malfunctioning.  Dr. Tomasita Morrow.   ??? Hx other surgical Right 08/01/09     LAP ADRENALECTOMY.  due to benign adrenal adenoma/tumor.  Dr. Boston Service.   ??? Hx other surgical  2008, 2013     LIVER BIOPSY X 2.           Family History   Problem Relation Age of Onset   ??? Hypertension Mother    ??? Breast Cancer Mother 60   ??? Stroke Mother 59   ??? Anxiety Mother    ??? Depression Mother    ??? Diabetes Father    ???  Hypertension Father    ??? Kidney Disease Father      reversed   ??? High Cholesterol Father    ??? Stroke Sister 21     Carotid Artery Dissection   ??? Other Sister      psedotumor cerebri   ??? Diabetes Paternal Grandmother    ??? Hypertension Paternal Grandmother    ??? Cancer Paternal Grandmother      ?LEUKEMIA   ??? Hypertension Paternal Grandfather    ??? High Cholesterol Paternal Grandfather    ??? Diabetes Maternal Grandmother    ??? Hypertension Maternal Grandmother    ??? Diabetes Maternal Grandfather    ??? Hypertension Maternal Grandfather    ??? Cancer Paternal Uncle      PANCREATIC   ??? Cancer Maternal Uncle      PANCREATIC        Social History     Social History   ??? Marital status: SINGLE     Spouse name: Single   ??? Number of children: 0   ??? Years of education: N/A     Occupational History   ??? [employer]     Social History Main Topics   ??? Smoking status: Former Smoker     Packs/day: 0.30     Years: 10.00     Types: Cigarettes     Quit date: 02/08/2009   ??? Smokeless tobacco: Never Used   ??? Alcohol use 0.5 oz/week     1 Glasses of wine per week   ??? Drug use: No   ??? Sexual activity: Yes     Other Topics Concern   ??? Not on file     Social History Narrative                 ALLERGIES: Aldactone [spironolactone]; Codeine; and Paxil [paroxetine hcl]    Review of Systems   Constitutional: Negative.    Respiratory: Negative.    Musculoskeletal:        Shoulder pain and aching    Neurological: Negative.  Negative for tingling and numbness.   Hematological: Negative.        Vitals:    02/20/15 1554   BP: 173/83   Pulse: 90   Resp: 16   Temp: 98.7 ??F (37.1 ??C)   SpO2: 98%   Weight: 95.7 kg (211 lb)   Height: 5\' 5"  (1.651 m)       Physical Exam   Constitutional: She is oriented to person, place, and time. She appears well-developed and well-nourished. No distress.   HENT:   Head: Normocephalic and atraumatic.   Mouth/Throat: Oropharynx is clear and moist.   Neck: Normal range of motion. Neck supple. No thyromegaly present.   Cardiovascular: Normal rate, regular rhythm and normal heart sounds.    Pulmonary/Chest: Effort normal and breath sounds normal. No respiratory distress. She has no wheezes. She has no rales. She exhibits no tenderness.   Musculoskeletal: She exhibits tenderness. She exhibits no edema or deformity.   Tender rt shoulder along the distal clavicle and AC and in the axilla without swelling, lymph nodes or masses. ROM is full but tender.  Distally she is NVI with prompt cap refill and no swelling. 5/5 strength   Lymphadenopathy:     She has no cervical adenopathy.   Neurological: She is alert and oriented to person, place, and time.   Skin: Skin is warm and dry.   Psychiatric: She has a normal mood and affect. Her behavior is normal. Judgment  and thought content normal.   Nursing note and vitals reviewed.      MDM     Differential Diagnosis; Clinical Impression; Plan:     CLINICAL IMPRESSION:  Pain in joint of right shoulder  (primary encounter diagnosis)    Plan:  1. NSAID  2. rest  3. Ortho as needed, PCP as needed  Copy of X rays reading given  Amount and/or Complexity of Data Reviewed:   Tests in the radiology section of CPT??:  Ordered and reviewed   Risk of Significant Complications, Morbidity, and/or Mortality:   Presenting problems:  Moderate  Management options:  Moderate  Progress:   Patient progress:  Stable      Procedures

## 2015-03-09 ENCOUNTER — Ambulatory Visit
Admit: 2015-03-09 | Discharge: 2015-03-09 | Payer: PRIVATE HEALTH INSURANCE | Attending: Family Medicine | Primary: Family Medicine

## 2015-03-09 DIAGNOSIS — Z833 Family history of diabetes mellitus: Secondary | ICD-10-CM | POA: Insufficient documentation

## 2015-03-09 DIAGNOSIS — Z8349 Family history of other endocrine, nutritional and metabolic diseases: Secondary | ICD-10-CM | POA: Insufficient documentation

## 2015-03-09 DIAGNOSIS — Z8249 Family history of ischemic heart disease and other diseases of the circulatory system: Secondary | ICD-10-CM | POA: Insufficient documentation

## 2015-03-09 DIAGNOSIS — Z800 Family history of malignant neoplasm of digestive organs: Secondary | ICD-10-CM | POA: Insufficient documentation

## 2015-03-09 DIAGNOSIS — Z841 Family history of disorders of kidney and ureter: Secondary | ICD-10-CM | POA: Insufficient documentation

## 2015-03-09 DIAGNOSIS — IMO0001 Reserved for inherently not codable concepts without codable children: Secondary | ICD-10-CM

## 2015-03-09 MED ORDER — ERGOCALCIFEROL (VITAMIN D2) 50,000 UNIT CAP
1250 mcg (50,000 unit) | ORAL_CAPSULE | ORAL | 2 refills | Status: DC
Start: 2015-03-09 — End: 2015-06-10

## 2015-03-09 MED ORDER — PHENTERMINE 37.5 MG TAB
37.5 mg | ORAL_TABLET | ORAL | 0 refills | Status: DC
Start: 2015-03-09 — End: 2015-06-10

## 2015-03-09 MED ORDER — LEVOTHYROXINE 100 MCG TAB
100 mcg | ORAL_TABLET | Freq: Every day | ORAL | 3 refills | Status: DC
Start: 2015-03-09 — End: 2015-05-27

## 2015-03-09 MED ORDER — ASPIRIN 81 MG TAB, DELAYED RELEASE
81 mg | ORAL_TABLET | Freq: Every day | ORAL | 3 refills | Status: DC
Start: 2015-03-09 — End: 2015-06-10

## 2015-03-09 NOTE — ACP (Advance Care Planning) (Signed)
Discussed ACP with patient.  Gave pt an Right to Decide handbook.  Patient prefers to read it on her own.  Declines referral to [facility] team at this time.  Patient will bring document to her next office visit or attach it to her MyChart record.

## 2015-03-09 NOTE — Patient Instructions (Signed)
Start 1/2 tablet Adipex-P 37.5 mg daily. Increase Levothyroxine from 88 mcg to 100 mcg, TSH goal 1-2. Start Vitamin D 50,000IU weekly x3 months and then take OTC Vitamin D 5,000IU daily.   Start OTC Vitamin B12 1,000 mcg daily and OTC Fish Oil with EPA and DHA 500-1,000 mg daily. Recommend OTC Magnesium 400 mg daily x1 week then every other day. Caution women of reproductive age that they may become more fertile with weight loss.

## 2015-03-09 NOTE — Progress Notes (Signed)
[facility]   [employer] [facility]   [facility]    INITIAL PHYSICIAN VISIT    HISTORY OF PRESENT ILLNESS  Agree with nurse and registered dietician notes.    Katrina [name] is a 50 y.o. female with Obesity Class II, Body mass index is 35.53 kg/(m^2). and associated health concerns presents for evaluation and treatment of weight management at the kind request of Talbert Cage, DO.     Diabetic, hypertensive pt with dyslipidemia, hypothyroidism, Vitamin D deficiency, and family hx of breast cancer, obesity, pancreatic cancer, heart disease, stroke, kidney disease, and DM presents to the office with a BP of 151/90. She stopped BP medications in 2014 due to resolving the hypertension using diet and exercise. In the past, she has also stopped statin medication due to improving with diet and exercise. For hypothyroidism, she takes Synthroid 88 mcg daily, tolerating well. For DM, she takes Metformin ER 500 mg 2 tablets BID, tolerating well.     Pt saw pulmonologist, Dr. Lendell Caprice on 02/22/15. Dx'd mild persistent asthma. He wanted her to continue with Symbicort 2 puffs BID and ProAir. F/U 6 months.     Pt has NASH and is followed by gastroenterologist, Dr. Tresa Moore Luketic of [facility], whom she last saw in 11/2014. She is actively participating in a NASH study.     Pt with hx of recurrent major depressive disorder; stable on Zoloft 50 mg daily.     Weight History  Current weight 213 lbs, up 6 lbs from 12/01/14. Best weight 178 lbs on 09/21/11. Highest weight 222 lbs prior to 2011. Goal weight 160 lbs.     She started school in 2015 and believes this is when she began to gain weight. She decreased how often she exercised and began to eat fast food more due to convenience and being on-the-go. She has continued working full-time and often skips meals because she gets caught up in her work. She received a promotion at work in 10/2014 and has more responsibility.      How many weight loss attempts have you had and which program(s) were you the most successful with? 1-2 major attempts. She has participated in Weight Watchers twice. The 2nd attempt, she lost 35 lbs and has regained all of it, plus some.    Have you ever taken weight loss medications or herbal remedies? No.    Have you ever had weight loss surgery? No, but sister had gastric band in 1985 when it was experimental in [place]. She just had her band removed because it slipped.    Sleep History  She was dx'd with mild OSA in 2012, treated with CPAP. CPAP was stopped in 11/2011 because she lost weight after she "got her energy back to start exercising." She last saw sleep specialist, Dr. Bretta Bang in 03/2011 and has not returned to see him. Her FitBit tells her she averages between 3-5 hours of restful sleep per night. She does not wake up tired in the mornings but she does need to drink a cup of coffee.     Eating Habits  How many times a week do you eat fast food or at restaurants? More than 2-3x per week  Do you have upcoming travel in the next 6 weeks? No.  Do you have a history of binge eating disorder, anorexia, and bulimia? No.    For breakfast, she normally drinks a shake with kale, berries, greek yogurt, and protein powder. She is taking  a nutrition class through school and has already cut out artificial sweeteners. She has had recent carb cravings and is concerned about this with regard to the VLCD.    Drinking Habits  How much caffeine do you drink daily? Coffee qAM.   How much alcohol do you drink daily and weekly? Glass of wine weekly.   How much water do you consume daily? 4-5 16.9 oz of water     Exercise  How many days a week do you currently exercise? 1-2x per week  Have you ever been told by a physician not to exercise? No.  Do you know of any reason you shouldn't exercise? No.   Do you own a pedometer or fitness tracker? Yes, FitBit. Averages 8,000 steps daily.    Do you have a gym membership? Yes.    What's your favorite physical activity? Enjoys exercise, running, walking, and yoga.   Are you using a fitness app? FitBit and MapMyRun.    Other History  Any history of drug abuse or dependence? No.  Do you have any current major lifestyle changes? She has evening classes this semester. She will be studying abroad in [place] from 07/04/15 until 07/18/15.   Are you pregnant or planning on becoming pregnant within 6 months? No.  How many times have you been pregnant? 0 times and.   Any potential unsupportive people in your life? No.  Are you ready to lose weight and become healthier? Yes.     Depression Screening  PHQ 2 / 9, over the last two weeks 03/09/2015   Little interest or pleasure in doing things Not at all   Feeling down, depressed or hopeless Not at all   Total Score PHQ 2 0     Pt denies any contraindications to VLCD including: hx of HA in the last 3 months, Type 1 DM, Kidney disease requiring protein restriction, Recent txs for Cancer, Hx of Uric-acid Kidney Stone, Gout, Gall stones, Recent onset of Inflammatory Bowel Disease, or Food Allergies.     Written by Lyndee Hensen, scribe, as dictated by Dr. Felix Ahmadi, DO.    ROS    Review of Systems negative except as noted above in HPI.    ALLERGIES:    Allergies   Allergen Reactions   ??? Aldactone [Spironolactone] Swelling and Hives   ??? Codeine Rash and Itching   ??? Paxil [Paroxetine Hcl] Other (comments)     WORSENED DEPRESSION.       CURRENT MEDICATIONS:    Outpatient Prescriptions Marked as Taking for the 03/09/15 encounter (Office Visit) with Talbert Cage, DO   Medication Sig Dispense Refill   ??? diclofenac EC (VOLTAREN) 75 mg EC tablet      ??? raNITIdine (ZANTAC) 150 mg tablet Take 150 mg by mouth daily. Indications: OTC     ??? ergocalciferol (ERGOCALCIFEROL) 50,000 unit capsule Take 1 Cap by mouth every seven (7) days. Indications: VITAMIN D DEFICIENCY 4 Cap 2   ??? levothyroxine (SYNTHROID) 100 mcg tablet Take 1 Tab by mouth Daily  (before breakfast). Indications: hypothyroidism 90 Tab 3   ??? phentermine (ADIPEX-P) 37.5 mg tablet Take 1 Tab by mouth every morning. Max Daily Amount: 37.5 mg. Indications: WEIGHT LOSS MANAGEMENT FOR OBESE PATIENT (BMI >= 30) 30 Tab 0   ??? aspirin delayed-release 81 mg tablet Take 1 Tab by mouth daily. Indications: prevention of transient ischemic attack 90 Tab 3   ??? budesonide-formoterol (SYMBICORT) 160-4.5 mcg/actuation HFA inhaler Take 2 Puffs by inhalation two (  2) times a day.     ??? metFORMIN ER (GLUCOPHAGE XR) 500 mg tablet TAKE 2 TABLETS BEFORE BREAKFAST AND DINNER FOR TYPE 2 DIABETES MELLITUS 360 Tab 3   ??? albuterol (PROVENTIL HFA, VENTOLIN HFA, PROAIR HFA) 90 mcg/actuation inhaler Take 2 Puffs by inhalation every four (4) hours as needed for Shortness of Breath. Indications: BRONCHOSPASM PREVENTION 1 Inhaler 5   ??? sertraline (ZOLOFT) 100 mg tablet Take 1 Tab by mouth daily. Indications: MAJOR DEPRESSIVE DISORDER 90 Tab 3   ??? b complex vitamins tablet Take 1 Tab by mouth daily.       ??? ascorbic acid (VITAMIN C) 500 mg tablet Take 500 mg by mouth daily.     ??? vitamin E (AQUA GEMS) 400 unit capsule Take 400 Units by mouth daily.         PAST MEDICAL HISTORY:    Past Medical History:   Diagnosis Date   ??? Abnormal gall bladder diagnostic imaging 07/2000    Abnormal HIDA.  Dr. Tomasita Morrow.   ??? Adrenal adenoma 06/2009    Dr. Mcneil Sober, nephro.  Dr. Boston Service, surgeon.   ??? 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  Dr. Gerilyn Nestle.   ??? Asthma 01/2015    Dr. Lendell Caprice.   ??? Chest pain of uncertain etiology [id number]    Dr. Sinclair Ship.  Normal Stress Echo 12/26/09 and 09/23/14.  Dr. Maurene Capes.   ??? Chronic insomnia 2009, 2011    Dr. Dineen Kid.   ??? Dyslipidemia Apr 18, 2007    Improving with weight loss   ??? Essential hypertension 04/18/95    Dr. Mcneil Sober, nephro.  Dr. Eli Phillips, ophth.   ??? Family history of breast cancer in mother 13    MRI, 6 mo.  Dr. Gerre Pebbles   ??? Fatty liver 07/25/2007     with elevated AST, ALT.  In NASH study at [facility] (Dr. Hilary Hertz)   ??? Heartburn 07/25/2007    Has improved with weight loss   ??? 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 Major depression, recurrent ([place]) 1990-04-18, April 18, 1995, Apr 18, 2007, 05/2013    situational.  fiance's death (04-18-95). Clydell Hakim, counselor.    ??? Obesity 07/25/2007    Improved with Lifestyle Modifications   ??? OSA (obstructive sleep apnea) 11/2010    Mild.  Txd CPAP 8 cm H20.  Dr. Bretta Bang.  stopped 11/2011.   ??? Osteoarthritis of lumbar spine     MRI 12/14   ??? Osteoarthritis of thoracic spine     MRI 12/14, Stable   ??? Type 2 diabetes, diet controlled ([place]) 04-18-2003   ??? Vitamin D deficiency 05/2010       PAST SURGICAL HISTORY:    Past Surgical History:   Procedure Laterality Date   ??? APPENDECTOMY  07/2000    Dr. Tomasita Morrow.   ??? HX BREAST REDUCTION  1989    Bilaterally.  Dr. Ashby Dawes.    ??? HX CHOLECYSTECTOMY  07/2000    Gallbladder malfunctioning.  Dr. Tomasita Morrow.   ??? HX OTHER SURGICAL Right 08/01/09    LAP ADRENALECTOMY.  due to benign adrenal adenoma/tumor.  Dr. Boston Service.   ??? HX OTHER SURGICAL  2008, 2013    LIVER BIOPSY X 2.     ??? HX TONSILLECTOMY  1974       FAMILY HISTORY:    Family History   Problem Relation Age of Onset   ??? Hypertension Mother    ???  Breast Cancer Mother 41   ??? Stroke Mother 74   ??? Anxiety Mother    ??? Depression Mother    ??? Diabetes Father    ??? Hypertension Father    ??? Kidney Disease Father      reversed   ??? High Cholesterol Father    ??? Stroke Sister 21     Carotid Artery Dissection   ??? Obesity Sister    ??? Other Sister      psedotumor cerebri   ??? Diabetes Paternal Grandmother    ??? Hypertension Paternal Grandmother    ??? Cancer Paternal Grandmother      ?LEUKEMIA   ??? Hypertension Paternal Grandfather    ??? High Cholesterol Paternal Grandfather    ??? Diabetes Maternal Grandmother    ??? Hypertension Maternal Grandmother    ??? Diabetes Maternal Grandfather    ??? Hypertension Maternal Grandfather     ??? Cancer Paternal Uncle      PANCREATIC   ??? Cancer Maternal Uncle      PANCREATIC       SOCIAL HISTORY:    Social History     Social History   ??? Marital status: SINGLE     Spouse name: Single   ??? Number of children: 0   ??? Years of education: N/A     Occupational History   ??? [employer]     Social History Main Topics   ??? Smoking status: Former Smoker     Packs/day: 0.30     Years: 10.00     Types: Cigarettes     Quit date: 02/08/2009   ??? Smokeless tobacco: Never Used   ??? Alcohol use 0.5 oz/week     1 Glasses of wine per week   ??? Drug use: No   ??? Sexual activity: Not Currently     Partners: Male     Birth control/ protection: Abstinence     Other Topics Concern   ??? None     Social History Narrative       IMMUNIZATIONS:    Immunization History   Administered Date(s) Administered   ??? Hep B Vaccine 06/08/2000   ??? Influenza Vaccine 10/14/2013, 11/05/2014   ??? Influenza Vaccine Split 10/24/2010   ??? Pneumococcal Polysaccharide (PPSV-23) 12/22/2010   ??? Pneumococcal Vaccine (Unspecified Type) 12/05/2005   ??? TD Vaccine 10/23/2000   ??? TDAP Vaccine 10/24/2010         PHYSICAL EXAMINATION    Vital Signs    Visit Vitals   ??? BP 151/90 (BP 1 Location: Right arm, BP Patient Position: Sitting)   ??? Pulse 78   ??? Temp 98.8 ??F (37.1 ??C) (Oral)   ??? Resp 18   ??? Ht 5\' 5"  (1.651 m)   ??? Wt 213 lb 8 oz (96.8 kg)   ??? LMP 02/28/2015 (Exact Date)   ??? SpO2 97%   ??? BMI 35.53 kg/m2       Weight Metrics 03/09/2015 03/09/2015 02/20/2015 12/01/2014 09/24/2014 04/28/2014 02/26/2014   Weight - 213 lb 8 oz 211 lb 207 lb 9.6 oz 206 lb 11.2 oz 203 lb 9.6 oz 197 lb 12.8 oz   Neck Circ (inches) 14 - - - - - -   Waist Measure Inches 45.5 - - - - - -   Body Fat % 39.6 - - - - - -   BMI - 35.53 kg/m2 35.11 kg/m2 34.55 kg/m2 34.4 kg/m2 33.88 kg/m2 32.92 kg/m2  General appearance - Well nourished. Well appearing.  Well developed.  No acute distress. Obese.   Head - Normocephalic.  Atraumatic.     Eyes - pupils equal and reactive. Extraocular eye movements intact. Sclera anicteric.  Mildly injected sclera.  Ears - Hearing is grossly normal bilaterally.      Nose - normal and patent.  No polyps noted.  No erythema.  No discharge.    Mouth - mucous membranes with adequate moisture.  Posterior pharynx normal with cobblestone appearance.  No erythema, white exudate or obstruction.  Neck - supple.  Midline trachea.  No carotid bruits noted bilaterally.  No thyromegaly noted.  Chest - clear to auscultation bilaterally anteriorly and posteriorly.  No wheezes.  No rales or rhonchi.  Breath sounds are symmetrical bilaterally.  Unlabored respirations.  Heart - normal rate.  Regular rhythm.  Normal S1, S2.  No murmur noted.  No rubs, clicks or gallops noted.  Abdomen - soft and distended.  No masses or organomegaly.  No rebound, rigidity or guarding.  Bowel sounds normal x 4 quadrants.  No tenderness noted.  Neurological - awake, alert and oriented to person, place, and time and event.  Cranial nerves II through XII intact.  Clear speech.  Muscle strength is +5/5 x 4 extremities.  Sensation is intact to light touch bilaterally.  Steady gait.   Heme/Lymph - peripheral pulses normal x 4 extremities.  No peripheral edema is noted.   Musculoskeletal - Intact x 4 extremities.  Full ROM x 4 extremities.  No pain with movement.    Back exam - normal range of motion.  No pain on palpation of the spinous processes in the cervical, thoracic, lumbar, sacral regions.  No CVA tenderness.  No buffalo hump noted.  Skin - no rashes, erythema, ecchymosis, lacerations, abrasions, suspicious moles noted.  No skin tags or moles.  No acanthosis nigricans noted in the axilla or neck.  Psychological -   normal behavior, dress and thought processes.  Good insight. Good eye contact.  Normal affect.  Appropriate mood.  Normal speech.      [facility] RESULTS HIGHLIGHTED    Labs dated 01/26/15 from [facility]      LDL was 133. HDL was 43.Triglycerides were 191. LDL-P was 2071. Small LDL-P was 1377.    Hs-CRP was 2.8.     Omega 3 was 4.5.     Insulin was 25. Hgb A1C was 6.4. Vitamin D was 27.     TSH was 2.82. Leptin was 55. Adiponectin was 3.    Creatinine was 0.6. ALT was 57. AST was 35. ALP was 83.   SEE SCANNED DOCUMENT FOR COMPLETE PANEL RESULTS.      EKG: normal EKG, normal sinus rhythm. No prolonged QTc noted.       ASSESSMENT and PLAN      ICD-10-CM ICD-9-CM    1. Obesity, Class II, BMI 35-39.9, with comorbidity (HCC) E66.01 278.01 AMB POC EKG ROUTINE W/ 12 LEADS, INTER & REP      phentermine (ADIPEX-P) 37.5 mg tablet   2. Hypothyroidism due to acquired atrophy of thyroid E03.4 244.8 levothyroxine (SYNTHROID) 100 mcg tablet     246.8    3. Essential hypertension I10 401.9    4. Type 2 diabetes, diet controlled (HCC) E11.9 250.00 aspirin delayed-release 81 mg tablet   5. OSA (obstructive sleep apnea) G47.33 327.23 REFERRAL TO SLEEP STUDIES   6. Essential fatty acid deficiency E63.0 269.8  7. Dyslipidemia E78.5 272.4     with elevated LDLP and sdLDL   8. Vitamin D deficiency E55.9 268.9 ergocalciferol (ERGOCALCIFEROL) 50,000 unit capsule   9. Inadequate sleep hygiene Z72.821 307.49     due to work and school    10. Overweight (BMI 25.0-29.9) E66.3 278.02    11. Family history of breast cancer in mother Z67.3 V16.3    5. Family history of stroke or transient ischemic attack in mother Z5.3 V17.1     and sister   31. Family history of diabetes mellitus (DM) Z83.3 V18.0    14. Family history of kidney disease Z84.1 V18.69    15. Family history of heart disease Z82.49 V17.49    16. Family history of obesity Z83.49 V18.19    17. Family history of pancreatic cancer Z80.0 V16.0    49. Depression screening Z13.89 V79.0    19. Recurrent major depressive disorder, in full remission ([place]) F33.42 296.36    20. Fatty liver K76.0 571.8    21. Mild intermittent asthma without complication [id number]     stable    22. Elevated C-reactive protein (CRP) R79.82 790.95      Chart reviewed and updated.      Based on pt history, lab results, EKG and exam performed today in our office, Katrina [name] is a good candidate for the [facility] using the [employer] products for an 800 calorie/day VLCD approach.      Referred patient to Juluis Pitch, RD for BS MSWLP to receive [employer] food products and weekly intervention.    Discussed the patient's BMI with her.  The BMI follow up plan is as follows: I have counseled this patient on diet and exercise regimens.    Diet prescription: VLCD (very low calorie diet)/800 calories daily using 3-4 meal replacements daily with [employer] food/beverage products recommended. Consume a minimum of 2 L (67 oz) of water daily while utilizing VLCD. Avoid sugar-sweetened beverages.   ??  Exercise prescription: Minimal and as tolerated while using VLCD.   ??  Sleep prescription: A goal of 7-8 hours of uninterrupted sleep is recommended to turn off the [place]lin hormone to be released from the stomach and triggers appetite while promoting weight gain. Proper rest turns on Leptin hormone to be released from white adipose tissue and promotes weight loss.    Reviewed MSWLP Commitment Form, Attendance Policy, and MSWLP Agreement and Consent previously discussed with Juluis Pitch, RD and signed by pt. SEE SCANNED DOCUMENTS.  Reviewed Coping, Eating, and Exercise Lifestyle Patterns Mini-Quizzes. Discussed appropriate strategies for positive responses. SEE SCANNED DOCUMENTS.  Recommend pt refer to VLCD manual if experiencing any side effects once program is initiated or call our office.  Additional relevant handouts given and discussed with patient today.    Continue current medications and care. Start 1/2 tablet Adipex-P 37.5 mg daily. Increase Levothyroxine from 88 mcg to 100 mcg, TSH goal 1-2. Start  Rx Vitamin D 50,000IU weekly x3 months and then take OTC Vitamin D 5,000IU daily.   Start OTC Vitamin B12 1,000 mcg daily and OTC Fish Oil with EPA and DHA 500-1,000 mg daily. Recommend OTC Magnesium 400 mg daily x1 week then every other day. Caution women of reproductive age that they may become more fertile with weight loss.   Prescriptions written and sent to pharmacy; medication side effects discussed. Levothyroxine 100 mcg. Vitamin D 50,000IU.   Prescriptions printed and given  to patient today. Adipex-P 37.5 mg 1/2 - 1 po q day to suppress appetite if needed.  Reviewed and discussed [place] lab results.   Recheck pertinent labs monthly with [facility] lab.    Recent office visit notes from Dr. Raynelle Dick reviewed.  Get recent office visit notes from [place] and Dr. Cristela Blue Advised pt to sign release.    Referrals given; patient urged to keep appointments with specialists. Sleep Specialist.   Counseled patient on health concerns:  VLCD, weight loss goals, sleep hygiene, OSA, BP, Vitamin D deficiency, and DM.   Weight loss goal of 5-10% in 6-12 months has shown significant improvement in obesity and its health consequences.  Immunizations noted.  Advance Care Booklet given at office visit. Discussed with pt today. Declines honoring choices referral.    Offered empathy, support, legitimation, prayers, partnership to patient.  Praised patient for progress.     Follow-up Disposition:  Return in about 1 month (around 04/09/2015) for weight .    Patient was offered a choice/choices in the treatment plan today.  Patient expresses understanding of the plan and agrees with recommendations.    More than 70 mins spent face to face with patient and more than 50% of this time spent in counseling and coordinating care and/or discussing treatment plans in reference to The primary encounter diagnosis was Obesity, Class II, BMI 35-39.9, with comorbidity ([place]). Diagnoses of  Hypothyroidism due to acquired atrophy of thyroid, Essential hypertension, Type 2 diabetes, diet controlled ([place]), OSA (obstructive sleep apnea), Essential fatty acid deficiency, Dyslipidemia, Vitamin D deficiency, Inadequate sleep hygiene, Overweight (BMI 25.0-29.9), Family history of breast cancer in mother, Family history of stroke or transient ischemic attack in mother, Family history of diabetes mellitus (DM), Family history of kidney disease, Family history of heart disease, Family history of obesity, Family history of pancreatic cancer, Depression screening, Recurrent major depressive disorder, in full remission ([place]), Fatty liver, Mild intermittent asthma without complication, and Elevated C-reactive protein (CRP) were also pertinent to this visit..    Written by Lyndee Hensen, scribe, as dictated by Dr. Felix Ahmadi, DO.      Documentation True and Accepted by Fayola Meckes R. Lorel Monaco, D.O.    [place], DO FOR ALLOWING ME THE PRIVILEGE TO PARTICIPATE IN THE CARE OF OUR MUTUAL PATIENT, Katrina [name] WITH RESPECT TO THEIR WEIGHT MANAGEMENT.    Patient Instructions   Start 1/2 tablet Adipex-P 37.5 mg daily. Increase Levothyroxine from 88 mcg to 100 mcg, TSH goal 1-2. Start Vitamin D 50,000IU weekly x3 months and then take OTC Vitamin D 5,000IU daily.   Start OTC Vitamin B12 1,000 mcg daily and OTC Fish Oil with EPA and DHA 500-1,000 mg daily. Recommend OTC Magnesium 400 mg daily x1 week then every other day. Caution women of reproductive age that they may become more fertile with weight loss.

## 2015-03-09 NOTE — Progress Notes (Signed)
Chief Complaint   Patient presents with   ??? Weight Management     Initial Visit.      1. Have you been to the ER, urgent care clinic since your last visit?  Hospitalized since your last visit?Yes, went to [facility].    2. Have you seen or consulted any other health care providers outside of the [place] since your last visit?  Include any pap smears or colon screening. Yes, went to Dr. Gilford Rile at [facility] for Bone Spur in right shoulder and rotator cuff tendonitis    In the event something were to happen to you and you were unable to speak on your behalf, do you have an Advance Directive/ Living Will in place stating your wishes? NO    If yes, do we have a copy on file NO    If no, would you like information:    Patient offered and declined. Patient stated that she already the information booklet.     Body Weight: 213.5lb  Height: 5'5  Muscle Mass: 33.1%  Body Water: 44.6%  BMR: 1597  BMI: 35.1%    Patient stated that she went to [facility] and had an XRAY on shoulder and stated that she had a spur in her rotator cuff in her right shoulder, as well as tendonitis. Encounter for UC is located in Encounter section. Went to [facility] and saw Dr. Gilford Rile on 02/23/2015 and received an order for PT which she started on Monday 03/07/2015 and a cortisone shot in right shoulder. Writer recommended to patient to schedule another visit for F/U for right shoulder due to this visit would be pertaining to [place] program. Patient stated "okay, that's fine."     Patient stated that she is taking Preparation H for hemroids. Is going to look up which one she is using so it can be added

## 2015-03-11 ENCOUNTER — Encounter

## 2015-03-14 NOTE — Telephone Encounter (Signed)
She is on the schedule for monthly follow up visit in [place] seen last week

## 2015-03-16 MED ORDER — SERTRALINE 100 MG TAB
100 mg | ORAL_TABLET | ORAL | 3 refills | Status: DC
Start: 2015-03-16 — End: 2016-11-05

## 2015-03-17 ENCOUNTER — Encounter: Primary: Family Medicine

## 2015-03-17 NOTE — Progress Notes (Signed)
Spoke with Patient today regarding enrollment in the Medically Supervised Weight Loss program. Patient decided to terminate enrollment at this time.  Reason: Pt reports having too many personal stressors at home to continue the program. She is working and in school and feels overwhelmed with starting a new program.  Pt reports she will contact us at a later date when she feels ready to start .  Her one month [facility] appointment on 04/13/15 has already been cancelled.  All Future Appointments with [facility] physician have been cancelled.  Future Appointments  Date Time Provider Bruno   06/10/2015 9:45 AM Talbert Cage, DO [facility] SCHED       Patient was made aware of the following Attendance Policy and Discharge Actions listed in the Agreement Form:  ???During the Reducing Phases, Patients will be allowed to miss no more than two (2) sessions in four (4) months. These absences include excused as well as unexcused absences. Patients missing more than two (2) sessions within any four-month period without communicating with the staff will be automatically dismissed from the Medically Supervised Weight Loss program. Meaning:  ? All future appointments (including physician appointments) will be automatically cancelled.  ? Patients will no longer be eligible to purchase [employer] products. Close medical supervision is essential.  ? All weight loss pictures taken during enrollment will be deleted from our database (if applicable).  ? Patient will be removed from the Facebook group (if applicable).  ? There may be a waiting period or Orientation repeat required for re-entry into the program.???

## 2015-03-17 NOTE — Progress Notes (Signed)
Communication appreciated.  Correspondence sent to pt via My Chart to offer other approaches to weight management.

## 2015-03-24 ENCOUNTER — Encounter: Primary: Family Medicine

## 2015-03-31 ENCOUNTER — Encounter: Primary: Family Medicine

## 2015-04-07 ENCOUNTER — Encounter: Primary: Family Medicine

## 2015-04-13 ENCOUNTER — Encounter: Attending: Family Medicine | Primary: Family Medicine

## 2015-05-04 ENCOUNTER — Encounter: Attending: Pediatric Pulmonology | Primary: Family Medicine

## 2015-05-19 ENCOUNTER — Inpatient Hospital Stay: Admit: 2015-05-26 | Payer: BLUE CROSS/BLUE SHIELD | Primary: Family Medicine

## 2015-05-19 ENCOUNTER — Ambulatory Visit
Admit: 2015-05-19 | Discharge: 2015-05-19 | Payer: PRIVATE HEALTH INSURANCE | Attending: Pediatric Pulmonology | Primary: Family Medicine

## 2015-05-19 DIAGNOSIS — G4733 Obstructive sleep apnea (adult) (pediatric): Secondary | ICD-10-CM

## 2015-05-19 NOTE — Patient Instructions (Signed)
[address]  [place] [address]  Tel.  [phone]  Fax. [phone] [address]  [place] [address]  Tel.  [phone]  Fax. [phone] [address] [place] [address]  Tel.  [phone]  Fax. [phone]     Sleep Apnea: After Your Visit  Your Care Instructions  Sleep apnea occurs when you frequently stop breathing for 10 seconds or longer during sleep. It can be mild to severe, based on the number of times per hour that you stop breathing or have slowed breathing. Blocked or narrowed airways in your nose, mouth, or throat can cause sleep apnea. Your airway can become blocked when your throat muscles and tongue relax during sleep.  Sleep apnea is common, occurring in 1 out of 20 individuals.  Individuals having any of the following characteristics should be evaluated and treated right away due to high risk and detrimental consequences from untreated sleep apnea:  1. Obesity  2. Congestive Heart failure  3. Atrial Fibrillation  4. Uncontrolled Hypertension  5. Type II Diabetes  6. Night-time Arrhythmias  7. Stroke  8. Pulmonary Hypertension  9. High-risk Driving Populations (pilots, truck drivers, etc.)  10. Patients Considering Weight-loss Surgery    How do you know you have sleep apnea?  You probably have sleep apnea if you answer 'yes' to 3 or more of the following questions:  S - Have you been told that you Snore?   T - Are you often Tired during the day?  O - Has anyone Observed you stop breathing while sleeping?  P- Do you have (or are being treated for) high blood Pressure?    B - Are you obese (Body Mass Index > 35)?  A - Is your Age 50 years old or older?  N - Is your Neck size greater than 16 inches?  G - Are you female Gender?  A sleep physician can prescribe a breathing device that prevents tissues in the throat from blocking your airway. Or your doctor may recommend using a dental device (oral breathing device) to help keep your airway  open. In some cases, surgery may be needed to remove enlarged tissues in the throat.  Follow-up care is a key part of your treatment and safety. Be sure to make and go to all appointments, and call your doctor if you are having problems. It's also a good idea to know your test results and keep a list of the medicines you take.  How can you care for yourself at home?  ?? Lose weight, if needed. It may reduce the number of times you stop breathing or have slowed breathing.  ?? Go to bed at the same time every night.  ?? Sleep on your side. It may stop mild apnea. If you tend to roll onto your back, sew a pocket in the back of your pajama top. Put a tennis ball into the pocket, and stitch the pocket shut. This will help keep you from sleeping on your back.  ?? Avoid alcohol and medicines such as sleeping pills and sedatives before bed.  ?? Do not smoke. Smoking can make sleep apnea worse. If you need help quitting, talk to your doctor about stop-smoking programs and medicines. These can increase your chances of quitting for good.  ?? Prop up the head of your bed 4 to 6 inches by putting bricks under the legs of the bed.  ?? Treat breathing problems, such as a stuffy nose, caused   by a cold or allergies.  ?? Use a continuous positive airway pressure (CPAP) breathing machine if lifestyle changes do not help your apnea and your doctor recommends it. The machine keeps your airway from closing when you sleep.  ?? If CPAP does not help you, ask your doctor whether you should try other breathing machines. A bilevel positive airway pressure machine has two types of air pressure????????one for breathing in and one for breathing out. Another device raises or lowers air pressure as needed while you breathe.  ?? If your nose feels dry or bleeds when using one of these machines, talk with your doctor about increasing moisture in the air. A humidifier may help.  ?? If your nose is runny or stuffy from using a breathing machine, talk  with your doctor about using decongestants or a corticosteroid nasal spray.  When should you call for help?  Watch closely for changes in your health, and be sure to contact your doctor if:  ?? You still have sleep apnea even though you have made lifestyle changes.  ?? You are thinking of trying a device such as CPAP.  ?? You are having problems using a CPAP or similar machine.                Where can you learn more?   Go to http://www.healthwise.net/[facility].  Enter J936 in the search box to learn more about "Sleep Apnea: After Your Visit."   ?? 2006-2010 Healthwise, Incorporated. Care instructions adapted under license by [facility] (which disclaims liability or warranty for this information). This care instruction is for use with your licensed healthcare professional. If you have questions about a medical condition or this instruction, always ask your healthcare professional. Healthwise disclaims any warranty or liability for your use of this information.      PROPER SLEEP HYGIENE    What to avoid  ?? Do not have drinks with caffeine, such as coffee or black tea, for 8 hours before bed.  ?? Do not smoke or use other types of tobacco near bedtime. Nicotine is a stimulant and can keep you awake.  ?? Avoid drinking alcohol late in the evening, because it can cause you to wake in the middle of the night.  ?? Do not eat a big meal close to bedtime. If you are hungry, eat a light snack.  ?? Do not drink a lot of water close to bedtime, because the need to urinate may wake you up during the night.  ?? Do not read or watch TV in bed. Use the bed only for sleeping and sexual activity.  What to try  ?? Go to bed at the same time every night, and wake up at the same time every morning. Do not take naps during the day.  ?? Keep your bedroom quiet, dark, and cool.  ?? Get regular exercise, but not within 3 to 4 hours of your bedtime..  ?? Sleep on a comfortable pillow and mattress.   ?? If watching the clock makes you anxious, turn it facing away from you so you cannot see the time.  ?? If you worry when you lie down, start a worry book. Well before bedtime, write down your worries, and then set the book and your concerns aside.  ?? Try meditation or other relaxation techniques before you go to bed.  ?? If you cannot fall asleep, get up and go to another room until you feel sleepy. Do something relaxing. Repeat your bedtime routine   before you go to bed again.  ?? Make your house quiet and calm about an hour before bedtime. Turn down the lights, turn off the TV, log off the computer, and turn down the volume on music. This can help you relax after a busy day.    Drowsy Driving  The U.[place] National Highway Traffic Safety Administration cites drowsiness as a causing factor in more than 100,000 police reported crashes annually, resulting in 76,000 injuries and 1,500 deaths. Other surveys suggest 55% of people polled have driven while drowsy in the past year, 23% had fallen asleep but not crashed, 3% crashed, and 2% had and accident due to drowsy driving.  Who is at risk?   Young Drivers: One study of drowsy driving accidents states that 55% of the drivers were under 25 years. Of those, 75% were female.   Shift Workers and Travelers: People who work overnight or travel across time zones frequently are at higher risk of experiencing Circadian Rhythm Disorders. They are trying to work and function when their body is programed to sleep.   Sleep Deprived: Lack of sleep has a serious impact on your ability to pay attention or focus on a task. Consistently getting less than the average of 8 hours your body needs creates partial or cumulative sleep deprivation.   Untreated Sleep Disorders: Sleep Apnea, Narcolepsy, R.L.S., and other sleep disorders (untreated) prevent a person from getting enough restful sleep. This leads to excessive daytime sleepiness and increases the risk  for drowsy driving accidents by up to 7 times.  Medications / Alcohol: Even over the counter medications can cause drowsiness. Medications that impair a drivers attention should have a warning label. Alcohol naturally makes you sleepy and on its own can cause accidents. Combined with excessive drowsiness its effects are amplified.   Signs of Drowsy Driving:   * You don't remember driving the last few miles   * You may drift out of your lane   * You are unable to focus and your thoughts wander   * You may yawn more often than normal   * You have difficulty keeping your eyes open / nodding off   * Missing traffic signs, speeding, or tailgating  Prevention-   Good sleep hygiene, lifestyle and behavioral choices have the most impact on drowsy driving. There is no substitute for sleep and the average person requires 8 hours nightly. If you find yourself driving drowsy, stop and sleep. Consider the sleep hygiene tips provided during your visit as well.     Medication Refill Policy: Refills for all medications require 1 week advance notice. Please have your pharmacy fax a refill request. We are unable to fax, or call in "controled substance" medications and you will need to pick these prescriptions up from our office.     MyChart Activation    Thank you for requesting access to MyChart. Please follow the instructions below to securely access and download your online medical record. MyChart allows you to send messages to your doctor, view your test results, renew your prescriptions, schedule appointments, and more.    How Do I Sign Up?    1. In your internet browser, go to [email or web address].  2. Click on the First Time User? Click Here link in the Sign In box. You will see the New Member Sign Up page.  3. Enter your MyChart Access Code exactly as it appears below. You will not need to use this code after you???ve completed the sign-up process. If    you do not sign up before the expiration date, you must request a new code.    MyChart Access Code: Activation code not generated  Current MyChart Status: Active (This is the date your MyChart access code will expire)    4. Enter the last four digits of your Social Security Number (xxxx) and Date of Birth (mm/dd/yyyy) as indicated and click Submit. You will be taken to the next sign-up page.  5. Create a MyChart ID. This will be your MyChart login ID and cannot be changed, so think of one that is secure and easy to remember.  6. Create a MyChart password. You can change your password at any time.  7. Enter your Password Reset Question and Answer. This can be used at a later time if you forget your password.   8. Enter your e-mail address. You will receive e-mail notification when new information is available in MyChart.  9. Click Sign Up. You can now view and download portions of your medical record.  10. Click the Download Summary menu link to download a portable copy of your medical information.    Additional Information    If you have questions, please call [phone]. Remember, MyChart is NOT to be used for urgent needs. For medical emergencies, dial 911.

## 2015-05-19 NOTE — Progress Notes (Signed)
[address] [place] [address]  Tel.  [phone]  [place] [address]  Tel.  [phone]  Fax. [phone] [place] [address]  Tel.  [phone]  Fax. [phone]         Subjective:      Katrina [name] is an 50 y.o. female referred for evaluation for a sleep disorder. She complains of snoring associated with periods of not breathing.  Symptoms began a few years ago, she was diagnosed with mild OSA in 2012 and treated with CPAP Therapy which was discontinued following weight loss.  She reports of current persistent tiredness following 8 hours of nocturnal sleep. She usually can fall asleep in 5 minutes.  She denies completely or partially paralyzed while falling asleep or waking up.  Ivar Drape does not wake up frequently at night. She is not bothered by waking up too early and left unable to get back to sleep. She actually sleeps about 5 hours at night and wakes up about 0 times during the night. She does not work shifts:  Marland Kitchen   Iylah indicates she does get too little sleep at night. Her bedtime is 2200. She awakens at 0600. She does not take naps.  She has the following observed behaviors: Grinding teeth;  .  Other remarks:   She denies of an urge to move extremities due to discomfort or other sensation and denies of dream enactment behavior.      Allergies   Allergen Reactions   ??? Aldactone [Spironolactone] Swelling and Hives   ??? Codeine Rash and Itching   ??? Paxil [Paroxetine Hcl] Other (comments)     WORSENED DEPRESSION.         Current Outpatient Prescriptions:   ???  sertraline (ZOLOFT) 100 mg tablet, TAKE 1 TABLET DAILY FOR MAJOR DEPRESSIVE DISORDER, Disp: 90 Tab, Rfl: 3  ???  diclofenac EC (VOLTAREN) 75 mg EC tablet, , Disp: , Rfl:   ???  raNITIdine (ZANTAC) 150 mg tablet, Take 150 mg by mouth daily. Indications: OTC, Disp: , Rfl:    ???  ergocalciferol (ERGOCALCIFEROL) 50,000 unit capsule, Take 1 Cap by mouth every seven (7) days. Indications: VITAMIN D DEFICIENCY, Disp: 4 Cap, Rfl: 2  ???  levothyroxine (SYNTHROID) 100 mcg tablet, Take 1 Tab by mouth Daily (before breakfast). Indications: hypothyroidism, Disp: 90 Tab, Rfl: 3  ???  phentermine (ADIPEX-P) 37.5 mg tablet, Take 1 Tab by mouth every morning. Max Daily Amount: 37.5 mg. Indications: WEIGHT LOSS MANAGEMENT FOR OBESE PATIENT (BMI >= 30), Disp: 30 Tab, Rfl: 0  ???  aspirin delayed-release 81 mg tablet, Take 1 Tab by mouth daily. Indications: prevention of transient ischemic attack, Disp: 90 Tab, Rfl: 3  ???  budesonide-formoterol (SYMBICORT) 160-4.5 mcg/actuation HFA inhaler, Take 2 Puffs by inhalation two (2) times a day., Disp: , Rfl:   ???  metFORMIN ER (GLUCOPHAGE XR) 500 mg tablet, TAKE 2 TABLETS BEFORE BREAKFAST AND DINNER FOR TYPE 2 DIABETES MELLITUS, Disp: 360 Tab, Rfl: 3  ???  b complex vitamins tablet, Take 1 Tab by mouth daily.  , Disp: , Rfl:   ???  ascorbic acid (VITAMIN C) 500 mg tablet, Take 500 mg by mouth daily., Disp: , Rfl:   ???  vitamin E (AQUA GEMS) 400 unit capsule, Take 400 Units by mouth daily., Disp: , Rfl:   ???  albuterol (PROVENTIL HFA, VENTOLIN HFA, PROAIR HFA) 90 mcg/actuation inhaler, Take 2  Puffs by inhalation every four (4) hours as needed for Shortness of Breath. 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    She  has a past surgical history that includes appendectomy (07/2000); tonsillectomy (1974); breast reduction (1989); cholecystectomy (07/2000); other surgical (Right, 08/01/09); and other surgical (2008, 2013).    She family history includes Anxiety in her mother; Breast Cancer (age of onset: 62) in her mother; Cancer in her maternal uncle, paternal grandmother, and paternal uncle; Depression in her mother; Diabetes in her father, maternal grandfather, maternal grandmother, and paternal grandmother; High Cholesterol in her father and paternal grandfather; Hypertension in her father, maternal grandfather, maternal grandmother, mother, paternal grandfather, and paternal grandmother; Kidney Disease in her father; Obesity in her sister; Other in her sister; Stroke (age of onset: 3) in her sister; Stroke (age of onset: 45) in her mother.    She  reports that she quit smoking about 6 years ago. Her smoking use included Cigarettes. She has a 3.00 pack-year smoking history. She has never used smokeless tobacco. She reports that she drinks about 0.5 oz of alcohol per week  She reports that she does not use illicit drugs.     Review of Systems:  Constitutional:  No significant weight loss or weight gain  Eyes:  No blurred vision  CVS:  No significant chest pain  Pulm:  No significant shortness of breath  GI:  No significant nausea or vomiting  GU:  No significant nocturia  Musculoskeletal:  No significant joint pain at night  Skin:  No significant rashes  Neuro:  No significant dizziness   Psych:  No active mood issues    Sleep Review of Systems: notable for no difficulty falling asleep; infrequent awakenings at night;  regular dreaming noted; no nightmares ;  no early morning headaches; no memory problems; no concentration issues; no history of any automobile or occupational accidents due to daytime drowsiness.      Objective:     Visit Vitals   ??? BP (!) 182/96  Comment: 144/88   ??? Pulse (P) 84   ??? Ht 5\' 5"  (1.651 m)   ??? Wt 210 lb (95.3 kg)   ??? SpO2 (P) 96%   ??? BMI 34.95 kg/m2         General:   Not in acute distress   Eyes:  Anicteric sclerae, no obvious strabismus   Nose:  No obvious nasal septum deviation    Oropharynx:   Class 4 oropharyngeal outlet, thick tongue base, uvula could not be seen due to low-lying soft palate, narrow tonsilo-pharyngeal pilars   Tonsils:   tonsils are not seen due to low-lying soft palate   Neck:   Neck circ. in "inches": 15; midline trachea   Chest/Lungs:  Equal lung expansion, clear on auscultation    CVS:  Normal rate, regular rhythm; no JVD   Skin:  Warm to touch; no obvious rashes  Neuro:  No focal deficits ; no obvious tremor    Psych:  Normal affect,  normal countenance;          Assessment:       ICD-10-CM ICD-9-CM    1. OSA (obstructive sleep apnea) G47.33 327.23 SLEEP STUDY UNATTENDED, 4 CHANNEL   2. Elevated blood pressure reading R03.0 796.2    3. BMI 34.0-34.9,adult Z68.34 V85.34    4. Thyroid disease E07.9 246.9          Plan:     * The patient currently has a Moderate Risk for having sleep apnea.  STOP-BANG score 4.  * Sleep testing was ordered for initial evaluation.    * She was provided information on sleep apnea including coresponding risk factors and the importance of proper treatment.   * Treatment options if indicated were reviewed today. Patient agrees to a trial of PAP therapy if indicated.  * Counseling was provided regarding proper sleep hygiene (including effect of light on sleep)  and safe driving.  * Effect of sleep disturbance on weight was reviewed. We have recommended a dedicated weight loss through appropriate diet and an exercise regiment  as significant weight reduction has been shown to reduce severity of obstructive sleep apnea.     * Patient was made aware of the dangerously elevated blood pressure noted at this visit. Signs and symptoms of hypertensive emergencies were reviewed with patient. Patient instructed to contact 911 and seek emergency medical attention if these were to occur.      * Telephone [phone]  follow-up shortly after sleep study to review results and plan final management.     (patient has given permission for a message to be left regarding test results and further management if patient cannot be cannot be reached directly).      Thank you for allowing [place] to participate in your patient's medical care.  We'll keep you updated on these investigations.    Cam Hai, MD, FAASM  Electronically signed. May 19, 2015

## 2015-05-19 NOTE — Progress Notes (Signed)
[employer])  NPR (no prior auth rq'd)  Danielle R.

## 2015-05-23 ENCOUNTER — Telehealth

## 2015-05-23 NOTE — Telephone Encounter (Signed)
HSAT Returned - [facility]  G3    Date of Study: 05/19/15    The following information was gathered from the patients study log:    ?? Lights off: 10:55P  ?? Estimated sleep onset: 11:20P    ?? Awakened a total of 0 times  ?? The patient felt they slept 6-7 hours  ?? Patient took no sleep aid before starting the test  ?? Sleep quality was the same compared to a usual nights sleep.    Further information provided: -

## 2015-05-24 NOTE — Telephone Encounter (Signed)
Left message regarding results of Sleep Testing, PAP prescription and with regards to follow-up. Message also indicated to patient to call if there were any further questions regarding their sleep symptoms.    Encounter Diagnosis   Name Primary?   ??? OSA (obstructive sleep apnea) Yes       Orders Placed This Encounter   ??? AMB SUPPLY ORDER     Diagnosis: Obstructive Sleep Apnea ICD-10 Code (G47.33)    Positive Airway Pressure Therapy: Duration of need: 99 months.   ResMed APAP Device: Minimum Pressure: 4 cmH2O, Maximum Pressure: 20 cmH2O.  Ramp Time: 30 Minutes.  EPR: 2.    CPAP mask -  Patient preference, headgear, tubing, and filter;  heated humidifier; wireless modem. Remote monitoring enrollment.    Kishan Wachsmuth S. Krue Peterka, MD, FAASM; NPI: [id number]  Electronically signed. May 24, 2015

## 2015-05-25 NOTE — Progress Notes (Signed)
It should be under procedures where it says unattended study.  If you still can't see it, try to go under media and 'sleep study result'.    Thank you!  Enjoy your weekend

## 2015-05-25 NOTE — Progress Notes (Signed)
This note is being routed to [employer], DO on 05/25/15.    Sleep Medicine consult note and sleep study report in patient's chart for review.    Thank you for the referral.

## 2015-05-26 NOTE — Telephone Encounter (Signed)
LM notifying patient of order being sent to [facility], provided the telephone number, and requested she return our call to schedule a 6 week f/u visit.

## 2015-05-26 NOTE — Progress Notes (Signed)
Faxed PAP setup request to [facility] on 05/26/15.

## 2015-05-27 ENCOUNTER — Encounter

## 2015-05-27 NOTE — Progress Notes (Signed)
Resent order to [facility] on 05/27/15.  Per Alvie Heidelberg, they never received it.

## 2015-05-30 NOTE — Telephone Encounter (Signed)
LOV: 03-09-2015  NOV: 06-10-2015  LAB: 09-18-2014  TSH: 1.31

## 2015-05-30 NOTE — Telephone Encounter (Signed)
From: Katrina [name]  To: Talbert Cage, DO  Sent: 05/27/2015 4:19 PM EDT  Subject:  Medication Renewal Request    Original  authorizing provider: Talbert Cage, DO    Katrina [name] would like a refill of the following medications:  levothyroxine  (SYNTHROID) 100 mcg tablet Talbert Cage, DO]    Preferred  pharmacy: [facility]    Comment:  Dr.  Lorel Monaco, I am close to needing a refill of my Levothyroxine 100 mcg tablet which I had been picking up at [facility] locally. I was advised by my insurance that filling it locally would no longer be covered since it is a maintenance medication. Could you send a prescription to [facility] so that my insurance will cover the medication? I think I have about 10-12 pills left in my current bottle. Thanks, Katrina [name]

## 2015-05-31 MED ORDER — LEVOTHYROXINE 100 MCG TAB
100 mcg | ORAL_TABLET | Freq: Every day | ORAL | 1 refills | Status: AC
Start: 2015-05-31 — End: ?

## 2015-06-01 ENCOUNTER — Encounter: Attending: Family Medicine | Primary: Family Medicine

## 2015-06-10 ENCOUNTER — Ambulatory Visit
Admit: 2015-06-10 | Discharge: 2015-06-10 | Payer: PRIVATE HEALTH INSURANCE | Attending: Family Medicine | Primary: Family Medicine

## 2015-06-10 DIAGNOSIS — I10 Essential (primary) hypertension: Secondary | ICD-10-CM

## 2015-06-10 LAB — AMB POC HEMOGLOBIN A1C: Hemoglobin A1c (POC): 6.5 %

## 2015-06-10 MED ORDER — PHENTERMINE 37.5 MG TAB
37.5 mg | ORAL_TABLET | ORAL | 0 refills | Status: DC
Start: 2015-06-10 — End: 2016-11-05

## 2015-06-10 MED ORDER — LISINOPRIL 20 MG TAB
20 mg | ORAL_TABLET | Freq: Every day | ORAL | 11 refills | Status: DC
Start: 2015-06-10 — End: 2015-08-10

## 2015-06-10 MED ORDER — ASPIRIN 81 MG TAB, DELAYED RELEASE
81 mg | ORAL_TABLET | Freq: Every day | ORAL | 3 refills | Status: AC
Start: 2015-06-10 — End: ?

## 2015-06-10 NOTE — Progress Notes (Signed)
HISTORY OF PRESENT ILLNESS  Katrina [name] is a 50 y.o. female presents with Blood Pressure Check; Diabetes (A1C 5.9 on 09/17/2014); Referral Follow Up; Other (urinary pressure right after she has finished voiding; "feels like a pressure, like I have to go again, but nothing happens."); Skin Problem (has a raised areas on left cheek bone ); Weight Management (has not been doing mswlp due to school; has not been taking apidex-p as well; wants to know if she should continue to take it); and Labs (pt is fasting for labs)    Agree with nurse note.    Pt complains of painful urination after voiding. She would like to be checked for UTI. No fever, chills, blood in her urine, abdominal pain, or other associated symptoms.    Diabetic, hypertensive pt with dyslipidemia, hypothyroidism, Vit D deficiency, elevated CRP, and fatty liver presents to the office with a BP of 162/90. Today, Hgb A1C is 6.5.      She weighs 209 lbs, lost 4 lbs since last ov. She was not able to continue with MSWLP due to her busy schedule but did eat the bars she had for dinner instead of skipping. She tries to walk during her work day since she goes to school at night and tries to be more conscious of what she eats. For breakfast, she drinks a shake with kale, charred spinach, black berries, blueberries, raspberries, walnuts, flax seed, whey based protein powder, greek yogurt, and almond milk. 48 oz of the shakes lasts her about a week. She drinks about 4-16.9 oz bottles of water daily. She discontinued her intake of artificial sweeteners and has noticed less cravings. She will be studying abroad in [place] from 07/05/15 through 07/19/15.  She tolerated Adipex-P 37.5 mg well, last prescribed on 03/09/15. She ran out in 04/2015 and requests a refill.     [name]. She picks up her CPAP machine this afternoon. 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  No SI or SA.     [name]. She saw orthopedist, Dr. Chiquita Loth on 02/23/15. Dx'd subacromial shoulder impingement syndrome. He noted a bone spur and arthritis per xray. He gave her a steroid injection. She went to PT with relief.     Pt with mild intermittent asthma; stable on Symbicort. She sees pulmonologist, Dr. Lendell Caprice in 08/2015.     Pt is concerned about a region of skin on her L cheek. She has a skin cancer screening at work next week.     Written by Lyndee Hensen, scribe, as dictated by Dr. Felix Ahmadi, DO.    ROS    Review of Systems negative except as noted above in HPI.    ALLERGIES:    Allergies   Allergen Reactions   ??? Aldactone [Spironolactone] Swelling and Hives   ??? Codeine Rash and Itching   ??? Paxil [Paroxetine Hcl] Other (comments)     WORSENED DEPRESSION.       CURRENT MEDICATIONS:    Outpatient Prescriptions Marked as Taking for the 06/10/15 encounter (Office Visit) with Talbert Cage, DO   Medication Sig Dispense Refill   ??? cholecalciferol, VITAMIN D3, (VITAMIN D3) 5,000 unit tab tablet Take  by mouth daily.     ??? cyanocobalamin (VITAMIN B-12) 1,000 mcg tablet Take 1,000 mcg by mouth daily.     ??? Omega-3 Fatty Acids (FISH OIL) 500 mg  cap Take  by mouth.     ??? aspirin delayed-release 81 mg tablet Take 1 Tab by mouth daily. Indications: prevention of transient ischemic attack 90 Tab 3   ??? phentermine (ADIPEX-P) 37.5 mg tablet Take 1 Tab by mouth every morning. Max Daily Amount: 37.5 mg. Indications: WEIGHT LOSS MANAGEMENT FOR OBESE PATIENT (BMI >= 30) 30 Tab 0   ??? lisinopril (PRINIVIL, ZESTRIL) 20 mg tablet Take 1 Tab by mouth daily. Indications: hypertension 30 Tab 11   ??? levothyroxine (SYNTHROID) 100 mcg tablet Take 1 Tab by mouth Daily (before breakfast). Indications: hypothyroidism 90 Tab 1   ??? sertraline (ZOLOFT) 100 mg tablet TAKE 1 TABLET DAILY FOR MAJOR DEPRESSIVE DISORDER 90 Tab 3   ??? raNITIdine (ZANTAC) 150 mg tablet Take 150 mg by mouth daily.  Indications: OTC     ??? budesonide-formoterol (SYMBICORT) 160-4.5 mcg/actuation HFA inhaler Take 2 Puffs by inhalation two (2) times a day.     ??? metFORMIN ER (GLUCOPHAGE XR) 500 mg tablet TAKE 2 TABLETS BEFORE BREAKFAST AND DINNER FOR TYPE 2 DIABETES MELLITUS 360 Tab 3   ??? albuterol (PROVENTIL HFA, VENTOLIN HFA, PROAIR HFA) 90 mcg/actuation inhaler Take 2 Puffs by inhalation every four (4) hours as needed for Shortness of Breath. Indications: BRONCHOSPASM PREVENTION 1 Inhaler 5   ??? b complex vitamins tablet Take 1 Tab by mouth daily.       ??? ascorbic acid (VITAMIN C) 500 mg tablet Take 500 mg by mouth daily.     ??? vitamin E (AQUA GEMS) 400 unit capsule Take 400 Units by mouth daily.         PAST MEDICAL HISTORY:    Past Medical History:   Diagnosis Date   ??? Abnormal gall bladder diagnostic imaging 07/2000    Abnormal HIDA.  Dr. Tomasita Morrow.   ??? Adrenal adenoma 06/2009    Dr. Mcneil Sober, nephro.  Dr. Boston Service, surgeon.   ??? 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  Dr. Gerilyn Nestle.   ??? Asthma 01/2015    Dr. Lendell Caprice.   ??? Chest pain of uncertain etiology 56/38/7564    Dr. Sinclair Ship.  Normal Stress Echo 12/26/09 and 09/23/14.  Dr. Maurene Capes.   ??? Chronic insomnia 2009, 2011    Dr. Dineen Kid.   ??? Dyslipidemia Mar 10, 2007    Improving with weight loss   ??? Essential hypertension Mar 10, 1995    Dr. Mcneil Sober, nephro.  Dr. Eli Phillips, ophth.   ??? Family history of breast cancer in mother 57    MRI, 6 mo.  Dr. Gerre Pebbles   ??? Fatty liver 07/25/2007    with elevated AST, ALT.  In NASH study at [facility] (Dr. Hilary Hertz)   ??? Heartburn 07/25/2007    Has improved with weight loss   ??? 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 Major depression, recurrent ([place]) March 09, 1990, 03/10/95, Mar 10, 2007, 05/2013    situational.  fiance's death (1995/03/10). Clydell Hakim, counselor.    ??? Obesity 07/25/2007    Improved with Lifestyle Modifications   ??? OSA (obstructive sleep apnea) 11/2010     Mild.  Txd CPAP 8 cm H20.  Dr. Bretta Bang.  stopped 11/2011.   ??? Osteoarthritis of lumbar spine     MRI 12/14   ??? Osteoarthritis of thoracic spine     MRI 12/14, Stable   ??? Right shoulder pain 03/10/15    due to spur, DJD.  Dr. Chiquita Loth   ??? Type 2  diabetes, diet controlled ([place]) 2005   ??? Vitamin D deficiency 05/2010       PAST SURGICAL HISTORY:    Past Surgical History:   Procedure Laterality Date   ??? APPENDECTOMY  07/2000    Dr. Tomasita Morrow.   ??? HX BREAST REDUCTION  1989    Bilaterally.  Dr. Ashby Dawes.    ??? HX CHOLECYSTECTOMY  07/2000    Gallbladder malfunctioning.  Dr. Tomasita Morrow.   ??? HX OTHER SURGICAL Right 08/01/09    LAP ADRENALECTOMY.  due to benign adrenal adenoma/tumor.  Dr. Boston Service.   ??? HX OTHER SURGICAL  2008, 2013    LIVER BIOPSY X 2.     ??? HX TONSILLECTOMY  1974       FAMILY HISTORY:    Family History   Problem Relation Age of Onset   ??? Hypertension Mother    ??? Breast Cancer Mother 32   ??? Stroke Mother 67   ??? Anxiety Mother    ??? Depression Mother    ??? Diabetes Father    ??? Hypertension Father    ??? Kidney Disease Father      reversed   ??? High Cholesterol Father    ??? Stroke Sister 21     Carotid Artery Dissection   ??? Obesity Sister    ??? Other Sister      psedotumor cerebri   ??? Diabetes Paternal Grandmother    ??? Hypertension Paternal Grandmother    ??? Cancer Paternal Grandmother      ?LEUKEMIA   ??? Hypertension Paternal Grandfather    ??? High Cholesterol Paternal Grandfather    ??? Diabetes Maternal Grandmother    ??? Hypertension Maternal Grandmother    ??? Diabetes Maternal Grandfather    ??? Hypertension Maternal Grandfather    ??? Cancer Paternal Uncle      PANCREATIC   ??? Cancer Maternal Uncle      PANCREATIC       SOCIAL HISTORY:    Social History     Social History   ??? Marital status: SINGLE     Spouse name: Single   ??? Number of children: 0   ??? Years of education: N/A     Occupational History   ??? [employer]     Social History Main Topics   ??? Smoking status: Former Smoker     Packs/day: 0.30      Years: 10.00     Types: Cigarettes     Quit date: 02/08/2009   ??? Smokeless tobacco: Never Used   ??? Alcohol use 0.5 oz/week     1 Glasses of wine per week   ??? Drug use: No   ??? Sexual activity: Not Currently     Partners: Male     Birth control/ protection: Abstinence     Other Topics Concern   ??? None     Social History Narrative       IMMUNIZATIONS:    Immunization History   Administered Date(s) Administered   ??? Hep B Vaccine 06/08/2000   ??? Influenza Vaccine 10/14/2013, 11/05/2014   ??? Influenza Vaccine Split 10/24/2010   ??? Pneumococcal Polysaccharide (PPSV-23) 12/22/2010   ??? Pneumococcal Vaccine (Unspecified Type) 12/05/2005   ??? TD Vaccine 10/23/2000   ??? TDAP Vaccine 10/24/2010         PHYSICAL EXAMINATION    Vital Signs    Visit Vitals   ??? BP 162/90 (BP 1 Location: Left arm, BP Patient Position: Sitting)   ???  Pulse 77   ??? Temp 98.4 ??F (36.9 ??C) (Oral)   ??? Resp 16   ??? Ht 5' 5"  (1.651 m)   ??? Wt 209 lb 8 oz (95 kg)   ??? LMP 05/20/2015 (Approximate)   ??? SpO2 96%   ??? BMI 34.86 kg/m2       Weight Metrics 06/10/2015 05/19/2015 03/09/2015 03/09/2015 02/20/2015 12/01/2014 09/24/2014   Weight 209 lb 8 oz 210 lb - 213 lb 8 oz 211 lb 207 lb 9.6 oz 206 lb 11.2 oz   Neck Circ (inches) - - 14 - - - -   Waist Measure Inches - - 45.5 - - - -   Body Fat % - - 39.6 - - - -   BMI 34.86 kg/m2 34.95 kg/m2 - 35.53 kg/m2 35.11 kg/m2 34.55 kg/m2 34.4 kg/m2       General appearance - Well nourished. Well appearing.  Well developed.  No acute distress. Overweight.   Head - Normocephalic.  Atraumatic.    Eyes - pupils equal and reactive. Extraocular eye movements intact. Sclera anicteric.  Mildly injected sclera.  Ears - Hearing is grossly normal bilaterally.      Nose - normal and patent.  No polyps noted.  No erythema.  No discharge.    Mouth - mucous membranes with adequate moisture.  Posterior pharynx normal with cobblestone appearance.  No erythema, white exudate or obstruction.   Neck - supple.  Midline trachea.  No carotid bruits noted bilaterally.  No thyromegaly noted.  Chest - clear to auscultation bilaterally anteriorly and posteriorly.  No wheezes.  No rales or rhonchi.  Breath sounds are symmetrical bilaterally.  Unlabored respirations.  Heart - normal rate.  Regular rhythm.  Normal S1, S2.  No murmur noted.  No rubs, clicks or gallops noted.  Abdomen - soft and distended.  No masses or organomegaly.  No rebound, rigidity or guarding.  Bowel sounds normal x 4 quadrants.  No tenderness noted.  Neurological - awake, alert and oriented to person, place, and time and event.  Cranial nerves II through XII intact.  Clear speech.  Muscle strength is +5/5 x 4 extremities.  Sensation is intact to light touch bilaterally.  Steady gait.   Heme/Lymph - peripheral pulses normal x 4 extremities.  No peripheral edema is noted.    Musculoskeletal - Intact x 4 extremities.  Full ROM x 4 extremities.  No pain with movement.    Back exam - normal range of motion.  No pain on palpation of the spinous processes in the cervical, thoracic, lumbar, sacral regions.  No CVA tenderness.  Increased muscle tension throughout.   Skin - no rashes, erythema, ecchymosis, lacerations, abrasions, suspicious moles noted. Eraser-sized, flesh clored, slightly raised nodule with irregular border nodule at L maxilla.   Psychological -   normal behavior, dress and thought processes.  Good insight. Good eye contact.  Normal affect.  Appropriate mood.  Normal speech.      DATA REVIEWED    Lab Results   Component Value Date/Time    WBC 8.9 09/17/2014 01:08 PM    HGB 13.6 09/17/2014 01:08 PM    HCT 41.0 09/17/2014 01:08 PM    PLATELET 326 09/17/2014 01:08 PM    MCV 84.9 09/17/2014 01:08 PM     Lab Results   Component Value Date/Time    Sodium 138 09/18/2014 05:35 AM    Potassium 4.0 09/18/2014 05:35 AM    Chloride 107 09/18/2014 05:35 AM    CO2  23 09/18/2014 05:35 AM    Anion gap 8 09/18/2014 05:35 AM     Glucose 108 09/18/2014 05:35 AM    BUN 10 09/18/2014 05:35 AM    Creatinine 0.65 09/18/2014 05:35 AM    BUN/Creatinine ratio 15 09/18/2014 05:35 AM    GFR est AA >60 09/18/2014 05:35 AM    GFR est non-AA >60 09/18/2014 05:35 AM    Calcium 9.2 09/18/2014 05:35 AM    Bilirubin, total 0.3 09/18/2014 05:35 AM    AST (SGOT) 26 09/18/2014 05:35 AM    Alk. phosphatase 72 09/18/2014 05:35 AM    Protein, total 6.7 09/18/2014 05:35 AM    Albumin 3.7 09/18/2014 05:35 AM    Globulin 3.0 09/18/2014 05:35 AM    A-G Ratio 1.2 09/18/2014 05:35 AM    ALT (SGPT) 56 09/18/2014 05:35 AM     Lab Results   Component Value Date/Time    Cholesterol, total 194 09/18/2014 05:18 AM    Cholesterol (POC) 130 12/25/2011 10:20 AM    HDL Cholesterol 43 09/18/2014 05:18 AM    HDL Cholesterol (POC) 52 12/25/2011 10:20 AM    LDL Cholesterol (POC) 65 12/25/2011 10:20 AM    LDL, calculated 126.4 09/18/2014 05:18 AM    VLDL, calculated 24.6 09/18/2014 05:18 AM    Triglyceride 123 09/18/2014 05:18 AM    Triglycerides (POC) 66 12/25/2011 10:20 AM    CHOL/HDL Ratio 4.5 09/18/2014 05:18 AM     Lab Results   Component Value Date/Time    VITAMIN D, 25-HYDROXY 27.2 05/25/2013 10:21 AM       Lab Results   Component Value Date/Time    Hemoglobin A1c 5.9 09/17/2014 01:08 PM    Hemoglobin A1c (POC) 6.5 06/10/2015 11:03 AM     Lab Results   Component Value Date/Time    TSH 1.31 09/18/2014 05:24 AM     Lab Results   Component Value Date/Time    Microalbumin/Creat ratio (mg/g creat) 21 12/19/2007 10:47 AM    Microalb/Creat ratio (ug/mg creat.) 6.0 09/24/2014 02:07 PM    Microalbumin,urine random 2.47 12/19/2007 10:47 AM    Microalbumin, urine 3.6 09/24/2014 02:07 PM     \\  ASSESSMENT and PLAN      ICD-10-CM ICD-9-CM    1. Essential hypertension I10 401.9 cyanocobalamin (VITAMIN B-12) 1,000 mcg tablet      lisinopril (PRINIVIL, ZESTRIL) 20 mg tablet      URINALYSIS W/ RFLX MICROSCOPIC    uncontrolled   2. Type 2 diabetes, diet controlled (HCC) E11.9 250.00 aspirin  delayed-release 81 mg tablet      AMB POC HEMOGLOBIN A1C      lisinopril (PRINIVIL, ZESTRIL) 20 mg tablet      URINALYSIS W/ RFLX MICROSCOPIC   3. Hypothyroidism due to acquired atrophy of thyroid E03.4 244.8      246.8    4. Dyslipidemia E78.5 272.4 Omega-3 Fatty Acids (FISH OIL) 500 mg cap   5. Inadequate sleep hygiene Z72.821 307.49    6. Obesity, Class I, BMI 30-34.9 E66.9 278.00 phentermine (ADIPEX-P) 37.5 mg tablet   7. OSA (obstructive sleep apnea) G47.33 327.23    8. Fatty liver K76.0 571.8    9. Vitamin D deficiency E55.9 268.9 cholecalciferol, VITAMIN D3, (VITAMIN D3) 5,000 unit tab tablet   10. Elevated C-reactive protein (CRP) R79.82 790.95    11. Recurrent major depressive disorder, in full remission ([place]) F33.42 296.36     stable   12. Weight loss R63.4 783.21  4# since 03/2015 due to efforts   13. Obesity, Class II, BMI 35-39.9, with comorbidity (HCC) E66.9 278.00 phentermine (ADIPEX-P) 37.5 mg tablet   14. Chronic right shoulder pain M25.511 719.41     G89.29 338.29     due to rotator cuff and bone spur   15. Mild intermittent asthma without complication W41.32 440.10     stable on Symbicort and with ProAir prior to exercise   16. Dermatitis L30.9 692.9    17. Dysuria R30.0 788.1 URINALYSIS W/ RFLX MICROSCOPIC      CULTURE, URINE       Discussed the patient's BMI with her.  The BMI follow up plan is as follows: I have counseled this patient on diet and exercise regimens.  Decrease carbohydrates (white foods, sweet foods, sweet drinks and alcohol), increase green leafy vegetables and protein (lean meats and beans) with each meal.  Avoid fried foods. Eat 3-5 small meals daily.  Do not skip meals.  Increase water intake.    Increase physical activity to 30 minutes daily for health benefit or 60 minutes daily to prevent weight regain, as tolerated.  Get 7-8 hours uninterrupted sleep nightly.  Chart reviewed and updated.    Staff message sent to Juluis Pitch, RD regarding MSWLP-LCD participation.      Continue current medications and care.  Restart Lisinopril 20 mg daily. Start Adipex 37.5 mg, month 1 of 3.   Prescriptions written and sent to pharmacy; medication side effects discussed. Lisinopril 20 mg.   Prescriptions printed and given to patient today. Adipex-P 37.5 mg.   Agree with pt going to her skin cancer screening at work.  Recheck pertinent labs today while fasting at [facility].   Recent office visit notes from Dr. Gilford Rile reviewed.  Get recent office visit notes from Dr. Raynelle Dick and mammogram with [facility]. Advised pt to sign release.    Counseled patient on health concerns:  BP, DM, and weight management/LCD. Sleep hygiene.  Asthma plan.  Immunizations noted.  Offered empathy, support, legitimation, prayers, partnership to patient.  Praised patient for progress.  Follow-up Disposition:  Return in about 1 month (around 07/10/2015) for med refill, weight, bp.    Patient was offered a choice/choices in the treatment plan today.  Patient expresses understanding of the plan and agrees with recommendations.    Patient declines any additional handouts.  Patient is satisfied with previous handouts received from our office    More than 40 mins spent face to face with patient and more than 50% of this time spent in counseling and coordinating care.    Written by Lyndee Hensen, scribe, as dictated by Dr. Felix Ahmadi, DO.      Documentation True and Accepted by Ariadna Setter R. Lorel Monaco, D.O.

## 2015-06-10 NOTE — Progress Notes (Signed)
Chief Complaint   Patient presents with   ??? Blood Pressure Check   ??? Diabetes     A1C 5.9 on 09/17/2014   ??? Referral Follow Up   ??? Other     urinary pressure right after she has finished voiding; "feels like a pressure, like I have to go again, but nothing happens."   ??? Skin Problem     has a raised areas on left cheek bone    ??? Weight Management     has not been doing mswlp due to school; has not been taking apidex-p as well; wants to know if she should continue to take it   ??? Labs     pt is fasting for labs     1. Have you been to the ER, urgent care clinic since your last visit?  Hospitalized since your last visit?No    2. Have you seen or consulted any other health care providers outside of the [place] since your last visit?  Include any pap smears or colon screening. Pt has been to [facility] for PT for right rotator cuff; notes in media. Pt has also seen Dr. Raynelle Dick; office faxed for note.     In the event something were to happen to you and you were unable to speak on your behalf, do you have an Advance Directive/ Living Will in place stating your wishes? NO    If yes, do we have a copy on file NO    If no, would you like information:   Pt offered and declined; stated that she will have it done by the end of the month before she leaves the country.

## 2015-06-11 LAB — URINALYSIS W/ RFLX MICROSCOPIC
Bilirubin: NEGATIVE
Blood: NEGATIVE
Glucose: NEGATIVE
Ketone: NEGATIVE
Nitrites: NEGATIVE
Protein: NEGATIVE
Specific Gravity: 1.008 (ref 1.005–1.030)
Urobilinogen: 0.2 mg/dL (ref 0.2–1.0)
pH (UA): 6 (ref 5.0–7.5)

## 2015-06-11 LAB — MICROSCOPIC EXAMINATION
Bacteria: NONE SEEN
Casts: NONE SEEN /lpf

## 2015-06-12 LAB — CULTURE, URINE

## 2015-06-14 NOTE — Progress Notes (Signed)
Pt notified of results by My Chart.    Nurse to call in Rx for Bactrim DS i po BID x 3 days.

## 2015-06-15 NOTE — Telephone Encounter (Signed)
LOV: 06-10-2015  NOV: 06-22-2015

## 2015-06-16 NOTE — Telephone Encounter (Signed)
Called patient in regard to re-starting MSWL LCD program in July after traveling abroad.  Left voicemail with return contact information.

## 2015-06-20 MED ORDER — CIPROFLOXACIN 250 MG TAB
250 mg | ORAL_TABLET | ORAL | 0 refills | Status: DC
Start: 2015-06-20 — End: 2016-11-05

## 2015-06-20 NOTE — Telephone Encounter (Signed)
Cipro 250 mg Rx verbally ordered and sent to pharmacy on file per Dr. Lorel Monaco.

## 2015-06-20 NOTE — Progress Notes (Signed)
Per Dr. Lorel Monaco, bactrim was changed to Cipro due to drug interaction

## 2015-06-22 ENCOUNTER — Institutional Professional Consult (permissible substitution)
Admit: 2015-06-22 | Discharge: 2015-06-22 | Payer: PRIVATE HEALTH INSURANCE | Attending: Family Medicine | Primary: Family Medicine

## 2015-06-22 DIAGNOSIS — E669 Obesity, unspecified: Secondary | ICD-10-CM

## 2015-06-22 NOTE — Progress Notes (Signed)
Chief Complaint   Patient presents with   ??? Weight Management     weigh in for mswlp     Body Weight: 202.0lb  Body Fat: 38.1%  Muscle Mass: 32.8%  Body H2O: 45.4%  BMR: 1540  BMI: 33.2%

## 2015-06-22 NOTE — Progress Notes (Signed)
Per Dr. Zella Ball request for pt clarification:    Pt stated that she has not officially started the MSWLP VLCD. Pt stated that she plans on starting the MSWLP VLCD after returning from her trip out of the country.   Pt has weigh in on 07/20/2015; will f/u with pt then about starting the program; pt also has an ov with Dr. Lorel Monaco on 08/03/2015

## 2015-06-22 NOTE — Progress Notes (Signed)
Reviewed and agree with Weekly Education Class nurse progress note for [place] [facility]ically Supervised [facility] (MSWLP) using [employer] food products. Continue current diet, care and follow up as planned and outlined in BS MSWLP manual.      Documentation true and accepted by Renaye Janicki R. Rhiley Tarver, D.O.

## 2015-06-23 NOTE — Progress Notes (Signed)
Cipro 250 mg, BID for 3 days

## 2015-06-29 ENCOUNTER — Institutional Professional Consult (permissible substitution)
Admit: 2015-06-29 | Discharge: 2015-06-29 | Payer: PRIVATE HEALTH INSURANCE | Attending: Family Medicine | Primary: Family Medicine

## 2015-06-29 DIAGNOSIS — E669 Obesity, unspecified: Secondary | ICD-10-CM

## 2015-06-29 NOTE — Progress Notes (Signed)
Progress Note: Weekly Education Class in the [employer] [facility] [employer] [facility]   Is there anything that you or the patient needs to let Dr Lorel Monaco know about? NO  Over the past week, have you experienced any side-effects? NO    Katrina [name] is a 50 y.o. female who is enrolled in [place] [employer] [facility]    Visit Vitals   ??? BP 148/82 (BP 1 Location: Left arm, BP Patient Position: Sitting)   ??? Ht 5\' 5"  (1.651 m)   ??? Wt 208 lb (94.3 kg)   ??? LMP 06/26/2015   ??? BMI 34.61 kg/m2     Weight Metrics 06/29/2015 06/22/2015 06/10/2015 05/19/2015 03/09/2015 03/09/2015 02/20/2015   Weight 208 lb 202 lb 209 lb 8 oz 210 lb - 213 lb 8 oz 211 lb   Neck Circ (inches) - 14 - - 14 - -   Waist Measure Inches 44.5 43.5 - - 45.5 - -   Body Fat % - 38.1 - - 39.6 - -   BMI 34.61 kg/m2 33.61 kg/m2 34.86 kg/m2 34.95 kg/m2 - 35.53 kg/m2 35.11 kg/m2         Have you received any other medical care this week? NO    Have you had any change in your medications since your last visit? NO     Did you have any problems adhering to the program last week? NO       Eating Habits Over Last Week:  Did you take in 64 oz of non-caloric fluids? Did not bring homework sheet    Did you consume your 4 meal replacements each day? Did not bring homework sheet      Physical Activity Over the Past Week:    Aerobic exercise: did not bring homework sheet  Resistance exercise: did not bring homework sheet

## 2015-06-29 NOTE — Progress Notes (Signed)
Reviewed and agree with Weekly Education Class nurse progress note for [place] Medically Super[facility] Weight Loss Program (MSWLP) using [employer] food products. Continue current diet, care and follow up as planned and outlined in BS MSWLP manual.      Documentation true and accepted by Delana Manganello R. Keaten Mashek, D.O.

## 2015-07-20 ENCOUNTER — Encounter: Attending: Family Medicine | Primary: Family Medicine

## 2015-08-03 ENCOUNTER — Encounter: Attending: Family Medicine | Primary: Family Medicine

## 2015-08-09 NOTE — Telephone Encounter (Signed)
Patient is calling in regards to symptoms she is having, she thinks from new medication Lisinopril 20 mgs.,  Have forwarded call to nurse, for disposition.  Patient [phone], [phone].

## 2015-08-09 NOTE — Telephone Encounter (Signed)
Patient has been on Lisinopril 20 mg for about 6 weeks.  She has been having a headache, felt exhausted and her legs feel weak and tired.  The headache has been for the past week and it is a dull ache.  In the past she had been on Lisinopril 10 mg with no problems.  She is going to have her BP checked at her office and let us know the results.  She has no cardiac symptoms.

## 2015-08-10 ENCOUNTER — Encounter

## 2015-08-10 MED ORDER — VALSARTAN 160 MG TAB
160 mg | ORAL_TABLET | Freq: Every day | ORAL | 1 refills | Status: DC
Start: 2015-08-10 — End: 2015-10-05

## 2015-08-10 NOTE — Telephone Encounter (Signed)
New Rx for Diovan 160 mg sent to pharmacy by Nurse [facility] (acting Team Lead).

## 2015-08-10 NOTE — Telephone Encounter (Signed)
LM on VM regarding medication change.

## 2015-08-10 NOTE — Telephone Encounter (Signed)
Pt informed to stop taking Lisinopril to see if symptoms would subside.  Pt was to wait one day then start new Rx which has been sent to [facility].  Pt to schedule 1 month f/u for b/p.  Pt states her b/p today was around 142/82 at work.  Pt transferred to schedule appt.

## 2015-08-10 NOTE — Telephone Encounter (Signed)
What are her bps?    Ok to stop Lisinopril to see if symptoms improve.  A new Rx for Diovan 160 mg daily has been sent to the pharmacy.  Start tomorrow.  Skip all meds today.  Pt needs a follow up appt for bp check this month.  Nurse please notify pt of med change then transfer to [place] for scheduling.  Thanks.

## 2015-09-20 ENCOUNTER — Ambulatory Visit
Admit: 2015-09-20 | Discharge: 2015-09-20 | Payer: PRIVATE HEALTH INSURANCE | Attending: Family Medicine | Primary: Family Medicine

## 2015-09-20 NOTE — Progress Notes (Signed)
Chief Complaint   Patient presents with   ??? Erroneous encounter-disregard     patient left prior to being brought by nurse, and seen by provider.      Pt left prior to being brought back by nurse, and being seen by provider.   Writer went to bring patient back; pt asked how much longer she would be after being roomed; Probation officer told her that it would be about 30 minutes before Dr. Lorel [name] would see her, that Dr. Lorel [name] went in with the pt ahead of her a few minutes ago. Pt stated that she had a meeting at [place] and would not be able to stay and that she would reschedule her appointment for as soon as she could.

## 2015-10-05 NOTE — Telephone Encounter (Signed)
Please confirm:  Is pt taking both Lisinopril and Diovan?  If so, who initiated both prescriptions?  How is she doing?

## 2015-10-05 NOTE — Telephone Encounter (Signed)
She is on the schedule for follow up the beginning of October

## 2015-10-10 NOTE — Telephone Encounter (Signed)
Pharmacy is still waiting on status of Valsartan.  [phone].

## 2015-10-11 NOTE — Telephone Encounter (Signed)
Patient says she is only taking the valsartan and is doing good on it. Says she was prescribed lisinopril by dr Lorel Monaco but she changed the medication to valsartan. Says she is out of her medication. Her contact # is [phone].

## 2015-10-12 MED ORDER — VALSARTAN 160 MG TAB
160 mg | ORAL_TABLET | ORAL | 0 refills | Status: DC
Start: 2015-10-12 — End: 2016-11-05

## 2015-10-13 NOTE — Telephone Encounter (Signed)
From: Ivar Drape  To: Talbert Cage, DO  Sent: 10/11/2015 8:55 PM EDT  Subject:  Medication Renewal Request    Original  authorizing provider: Talbert Cage, DO    Katrina [name] would like a refill of the following medications:  valsartan  (DIOVAN) 160 mg tablet Talbert Cage, DO]    Preferred  pharmacy: [id number] - [place], [place] - [address]    Comment:  I  requested a refill of Diovan via my pharmacy last Wednesday (Sept. 27th); pharmacy faxed over request and has followed up with a phone call to your office. I contacted the office yesterday and today. I took my last pill on Monday. Please process Diovan refill request.

## 2015-10-13 NOTE — Telephone Encounter (Signed)
Last OV: 09-20-15  Next visit: 10-14-15  Last labs: 09-17-14

## 2015-10-14 ENCOUNTER — Encounter: Attending: Family Medicine | Primary: Family Medicine

## 2015-10-14 MED ORDER — VALSARTAN 160 MG TAB
160 mg | ORAL_TABLET | Freq: Every day | ORAL | 1 refills | Status: DC
Start: 2015-10-14 — End: 2015-10-21

## 2015-10-21 ENCOUNTER — Ambulatory Visit
Admit: 2015-10-21 | Discharge: 2015-10-21 | Payer: PRIVATE HEALTH INSURANCE | Attending: Pediatric Pulmonology | Primary: Family Medicine

## 2015-10-21 DIAGNOSIS — G4733 Obstructive sleep apnea (adult) (pediatric): Secondary | ICD-10-CM

## 2015-10-21 NOTE — Progress Notes (Signed)
[address] [place] [address]  Tel.  [phone]  [place] [address]  Tel.  [phone]  Fax. [phone] [place] [address]  Tel.  [phone]  Fax. [phone]     S>Katrina [name] is a 50 y.o. female seen for a positive airway pressure follow-up.  She reports no problems using the device.  She is 57% compliant over the past 30 days.  The following problems are identified:    Drowsiness no Problems exhaling no   Snoring no Forget to put on no   Mask Comfortable yes Can't fall asleep no   Dry Mouth no Mask falls off no   Air Leaking no Frequent awakenings no         She admits that her sleep has improved.    Allergies   Allergen Reactions   ??? Aldactone [Spironolactone] Swelling and Hives   ??? Codeine Rash and Itching   ??? Paxil [Paroxetine Hcl] Other (comments)     WORSENED DEPRESSION.       She has a current medication list which includes the following prescription(s): soy isofla/blk cohosh/mag bark, evening primrose oil, ashwagandha root extract, OTHER, valsartan, qvar, nystatin-triamcinolone, cholecalciferol (vitamin d3), cyanocobalamin, omega-3 fatty acids, aspirin delayed-release, phentermine, levothyroxine, sertraline, diclofenac ec, ranitidine, budesonide-formoterol, metformin er, albuterol, b complex vitamins, ascorbic acid (vitamin c), vitamin e, and ciprofloxacin hcl..      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    Epworth Sleepiness Score: 4   and Modified F.O.S.Q. Score Total / 2: 20   which reflect improved sleep quality over therapy time.    O>    Visit Vitals   ??? BP 126/75   ??? Pulse 68   ??? Ht 5\' 5"  (1.651 m)   ??? Wt 208 lb 14.4 oz (94.8 kg)   ??? SpO2 98%   ??? BMI 34.76 kg/m2         General:   Not in acute distress   Eyes:  Anicteric sclerae, no obvious strabismus   Nose:  No obvious nasal septum deviation    Oropharynx:   Class 4 oropharyngeal outlet, thick tongue base, uvula not seen due to low-lying soft palate, narrow tonsilo-pharyngeal pilars   Tonsils:   tonsils are not visualized due to low-lying soft palate   Neck:   midline trachea   Chest/Lungs:  Equal lung expansion, clear on auscultation    CVS:  Normal rate, regular rhythm; no JVD   Skin:  Warm to touch; no obvious rashes   Neuro:  No focal deficits ; no obvious tremor    Psych:  Normal affect,  normal countenance;           A>    ICD-10-CM ICD-9-CM    1. OSA (obstructive sleep apnea) G47.33 327.23    2. BMI 34.0-34.9,adult Z68.34 V85.34    3. H/O: depression Z86.59 V11.8  AHI = 5.8.  On APAP :  4-20 cmH2O.    Compliant:      yes    Therapeutic Response:  Positive    P>    * We have recommended a dedicated weight loss through appropriate diet and an exercise regiment as significant weight reduction has been shown to reduce severity of obstructive sleep apnea.       * Follow-up Disposition:  Return in about 1 year (around 10/20/2016), or if symptoms worsen or fail to improve.    * She was asked to contact our office for any problems regarding PAP therapy.    * Counseling was provided regarding the importance of regular PAP use and on proper sleep hygiene and safe driving.     * Re-enforced proper and regular cleaning for the device.    Thank you for allowing [place] to participate in your patient's medical care.      Cam Hai, MD, FAASM  Electronically signed. 10/21/15

## 2015-10-21 NOTE — Patient Instructions (Signed)
[address]  [place] [address]  Tel.  [phone]  Fax. [phone] [address]  [place] [address]  Tel.  [phone]  Fax. [phone] [address] [place] [address]  Tel.  [phone]  Fax. [phone]     Learning About CPAP for Sleep Apnea  What is CPAP?              CPAP is a small machine that you use at home every night while you sleep. It increases air pressure in your throat to keep your airway open. When you have sleep apnea, this can help you sleep better so you feel much better. CPAP stands for "continuous positive airway pressure."  The CPAP machine will have one of the following:  ?? A mask that covers your nose and mouth  ?? Prongs that fit into your nose  ?? A mask that covers your nose only, the most common type. This type is called NCPAP. The N stands for "nasal."  Why is it done?  CPAP is usually the best treatment for obstructive sleep apnea. It is the first treatment choice and the most widely used. Your doctor may suggest CPAP if you have:  ?? Moderate to severe sleep apnea.  ?? Sleep apnea and coronary artery disease (CAD) or heart failure.  How does it help?  ?? CPAP can help you have more normal sleep, so you feel less sleepy and more alert during the daytime.  ?? CPAP may help keep heart failure or other heart problems from getting worse.  ?? NCPAP may help lower your blood pressure.  ?? If you use CPAP, your bed partner may also sleep better because you are not snoring or restless.  What are the side effects?  Some people who use CPAP have:  ?? A dry or stuffy nose and a sore throat.  ?? Irritated skin on the face.  ?? Sore eyes.  ?? Bloating.  If you have any of these problems, work with your doctor to fix them. Here are some things you can try:  ?? Be sure the mask or nasal prongs fit well.  ?? See if your doctor can adjust the pressure of your CPAP.  ?? If your nose is dry, try a humidifier.   ?? If your nose is runny or stuffy, try decongestant medicine or a steroid nasal spray.  If these things do not help, you might try a different type of machine. Some machines have air pressure that adjusts on its own. Others have air pressures that are different when you breathe in than when you breathe out. This may reduce discomfort caused by too much pressure in your nose.               Where can you learn more?   Go to http://www.healthwise.net/[facility]  Enter X266 in the search box to learn more about "Learning About CPAP for Sleep Apnea."   ?? 2006-2011 Healthwise, Incorporated. Care instructions adapted under license by [facility] (which disclaims liability or warranty for this information). This care instruction is for use with your licensed healthcare professional. If you have questions about a medical condition or this instruction, always ask your healthcare professional. Healthwise, Incorporated disclaims any warranty or liability for your use of this information.  Content Version: 8.9.83828; Last Revised: January 19, 2008  PROPER SLEEP HYGIENE    What to avoid  ?? Do not have drinks with caffeine, such as coffee or black tea,   for 8 hours before bed.  ?? Do not smoke or use other types of tobacco near bedtime. Nicotine is a stimulant and can keep you awake.  ?? Avoid drinking alcohol late in the evening, because it can cause you to wake in the middle of the night.  ?? Do not eat a big meal close to bedtime. If you are hungry, eat a light snack.  ?? Do not drink a lot of water close to bedtime, because the need to urinate may wake you up during the night.  ?? Do not read or watch TV in bed. Use the bed only for sleeping and sexual activity.  What to try  ?? Go to bed at the same time every night, and wake up at the same time every morning. Do not take naps during the day.  ?? Keep your bedroom quiet, dark, and cool.  ?? Get regular exercise, but not within 3 to 4 hours of your bedtime..   ?? Sleep on a comfortable pillow and mattress.  ?? If watching the clock makes you anxious, turn it facing away from you so you cannot see the time.  ?? If you worry when you lie down, start a worry book. Well before bedtime, write down your worries, and then set the book and your concerns aside.  ?? Try meditation or other relaxation techniques before you go to bed.  ?? If you cannot fall asleep, get up and go to another room until you feel sleepy. Do something relaxing. Repeat your bedtime routine before you go to bed again.  ?? Make your house quiet and calm about an hour before bedtime. Turn down the lights, turn off the TV, log off the computer, and turn down the volume on music. This can help you relax after a busy day.    Drowsy Driving: The U.[place] National Highway Traffic Safety Administration cites drowsiness as a causing factor in more than 100,000 police reported crashes annually, resulting in 76,000 injuries and 1,500 deaths. Other surveys suggest 55% of people polled have driven while drowsy in the past year, 23% had fallen asleep but not crashed, 3% crashed, and 2% had and accident due to drowsy driving.  Who is at risk?   Young Drivers: One study of drowsy driving accidents states that 55% of the drivers were under 25 years. Of those, 75% were female.   Shift Workers and Travelers: People who work overnight or travel across time zones frequently are at higher risk of experiencing Circadian Rhythm Disorders. They are trying to work and function when their body is programed to sleep.   Sleep Deprived: Lack of sleep has a serious impact on your ability to pay attention or focus on a task. Consistently getting less than the average of 8 hours your body needs creates partial or cumulative sleep deprivation.   Untreated Sleep Disorders: Sleep Apnea, Narcolepsy, R.L.S., and other sleep disorders (untreated) prevent a person from getting enough restful  sleep. This leads to excessive daytime sleepiness and increases the risk for drowsy driving accidents by up to 7 times.  Medications / Alcohol: Even over the counter medications can cause drowsiness. Medications that impair a drivers attention should have a warning label. Alcohol naturally makes you sleepy and on its own can cause accidents. Combined with excessive drowsiness its effects are amplified.   Signs of Drowsy Driving:   * You don't remember driving the last few miles   * You may drift out of your lane   *   You are unable to focus and your thoughts wander   * You may yawn more often than normal   * You have difficulty keeping your eyes open / nodding off   * Missing traffic signs, speeding, or tailgating  Prevention-   Good sleep hygiene, lifestyle and behavioral choices have the most impact on drowsy driving. There is no substitute for sleep and the average person requires 8 hours nightly. If you find yourself driving drowsy, stop and sleep. Consider the sleep hygiene tips provided during your visit as well.     Medication Refill Policy: Refills for all medications require 1 week advance notice. Please have your pharmacy fax a refill request. We are unable to fax, or call in "controled substance" medications and you will need to pick these prescriptions up from our office.     MyChart Activation    Thank you for requesting access to MyChart. Please follow the instructions below to securely access and download your online medical record. MyChart allows you to send messages to your doctor, view your test results, renew your prescriptions, schedule appointments, and more.    How Do I Sign Up?    1. In your internet browser, go to [email or web address].  2. Click on the First Time User? Click Here link in the Sign In box. You will see the New Member Sign Up page.  3. Enter your MyChart Access Code exactly as it appears below. You will  not need to use this code after you???ve completed the sign-up process. If you do not sign up before the expiration date, you must request a new code.    MyChart Access Code: Activation code not generated  Current MyChart Status: Active (This is the date your MyChart access code will expire)    4. Enter the last four digits of your Social Security Number (xxxx) and Date of Birth (mm/dd/yyyy) as indicated and click Submit. You will be taken to the next sign-up page.  5. Create a MyChart ID. This will be your MyChart login ID and cannot be changed, so think of one that is secure and easy to remember.  6. Create a MyChart password. You can change your password at any time.  7. Enter your Password Reset Question and Answer. This can be used at a later time if you forget your password.   8. Enter your e-mail address. You will receive e-mail notification when new information is available in MyChart.  9. Click Sign Up. You can now view and download portions of your medical record.  10. Click the Download Summary menu link to download a portable copy of your medical information.    Additional Information    If you have questions, please call [phone]. Remember, MyChart is NOT to be used for urgent needs. For medical emergencies, dial 911.

## 2015-10-28 ENCOUNTER — Encounter

## 2015-11-02 ENCOUNTER — Ambulatory Visit

## 2015-11-02 ENCOUNTER — Inpatient Hospital Stay: Admit: 2015-11-02 | Payer: BLUE CROSS/BLUE SHIELD | Attending: Ophthalmology | Primary: Family Medicine

## 2015-11-02 DIAGNOSIS — E0500 Thyrotoxicosis with diffuse goiter without thyrotoxic crisis or storm: Secondary | ICD-10-CM

## 2016-09-19 LAB — HM PAP SMEAR

## 2016-10-25 ENCOUNTER — Encounter: Attending: Pediatric Pulmonology | Primary: Family Medicine

## 2016-11-05 ENCOUNTER — Inpatient Hospital Stay: Admit: 2016-11-05 | Payer: BLUE CROSS/BLUE SHIELD | Primary: Family Medicine

## 2016-11-05 DIAGNOSIS — Z01818 Encounter for other preprocedural examination: Secondary | ICD-10-CM

## 2016-11-05 LAB — CBC WITH AUTOMATED DIFF
ABS. BASOPHILS: 0 10*3/uL (ref 0.0–0.1)
ABS. EOSINOPHILS: 0.4 10*3/uL (ref 0.0–0.4)
ABS. IMM. GRANS.: 0 10*3/uL (ref 0.00–0.04)
ABS. LYMPHOCYTES: 2.9 10*3/uL (ref 0.8–3.5)
ABS. MONOCYTES: 0.5 10*3/uL (ref 0.0–1.0)
ABS. NEUTROPHILS: 3.9 10*3/uL (ref 1.8–8.0)
ABSOLUTE NRBC: 0 10*3/uL (ref 0.00–0.01)
BASOPHILS: 0 % (ref 0–1)
EOSINOPHILS: 5 % (ref 0–7)
HCT: 39.4 % (ref 35.0–47.0)
HGB: 12.6 g/dL (ref 11.5–16.0)
IMMATURE GRANULOCYTES: 1 % — ABNORMAL HIGH (ref 0.0–0.5)
LYMPHOCYTES: 37 % (ref 12–49)
MCH: 28.9 PG (ref 26.0–34.0)
MCHC: 32 g/dL (ref 30.0–36.5)
MCV: 90.4 FL (ref 80.0–99.0)
MONOCYTES: 7 % (ref 5–13)
MPV: 11.5 FL (ref 8.9–12.9)
NEUTROPHILS: 50 % (ref 32–75)
NRBC: 0 PER 100 WBC
PLATELET: 334 10*3/uL (ref 150–400)
RBC: 4.36 M/uL (ref 3.80–5.20)
RDW: 13.5 % (ref 11.5–14.5)
WBC: 7.8 10*3/uL (ref 3.6–11.0)

## 2016-11-05 NOTE — Other (Signed)
PRE-OPERATIVE INSTRUCTIONS REVIEWED WITH PATIENT ANS SURGICAL SITE INFECTION REVIEWED . CHG WIPES GIVEN X2 AND REVIEWED HOW TO USE.

## 2016-11-06 LAB — EKG 12-LEAD
Atrial Rate: 66 {beats}/min
Diagnosis: NORMAL
P Axis: 47 degrees
P-R Interval: 142 ms
Q-T Interval: 480 ms
QRS Duration: 76 ms
QTc Calculation (Bazett): 503 ms
R Axis: 29 degrees
T Axis: 2 degrees
Ventricular Rate: 66 {beats}/min

## 2016-11-06 LAB — EKG, 12 LEAD, INITIAL
Atrial Rate: 66 {beats}/min
Calculated P Axis: 47 degrees
Calculated R Axis: 29 degrees
Calculated T Axis: 2 degrees
Diagnosis: NORMAL
P-R Interval: 142 ms
Q-T Interval: 480 ms
QRS Duration: 76 ms
QTC Calculation (Bezet): 503 ms
Ventricular Rate: 66 {beats}/min

## 2016-11-13 ENCOUNTER — Inpatient Hospital Stay
Admit: 2016-11-13 | Discharge: 2016-11-14 | Disposition: A | Discharge status: Home / Self Care | Payer: BLUE CROSS/BLUE SHIELD | Attending: Obstetrics & Gynecology | Admitting: Obstetrics & Gynecology

## 2016-11-13 DIAGNOSIS — D251 Intramural leiomyoma of uterus: Secondary | ICD-10-CM

## 2016-11-13 LAB — TYPE AND SCREEN
ABO/Rh: O POS
Antibody Screen: NEGATIVE

## 2016-11-13 LAB — GLUCOSE, POC
Glucose (POC): 101 mg/dL — ABNORMAL HIGH (ref 65–100)
Glucose (POC): 138 mg/dL — ABNORMAL HIGH (ref 65–100)
Glucose (POC): 127 mg/dL — ABNORMAL HIGH (ref 65–100)

## 2016-11-13 LAB — CBC W/O DIFF
ABSOLUTE NRBC: 0 10*3/uL (ref 0.00–0.01)
HCT: 36.6 % (ref 35.0–47.0)
HGB: 11.6 g/dL (ref 11.5–16.0)
MCH: 28.4 PG (ref 26.0–34.0)
MCHC: 31.7 g/dL (ref 30.0–36.5)
MCV: 89.5 FL (ref 80.0–99.0)
MPV: 10.9 FL (ref 8.9–12.9)
NRBC: 0 PER 100 WBC
PLATELET: 329 10*3/uL (ref 150–400)
RBC: 4.09 M/uL (ref 3.80–5.20)
RDW: 13 % (ref 11.5–14.5)
WBC: 11.4 10*3/uL — ABNORMAL HIGH (ref 3.6–11.0)

## 2016-11-13 LAB — TYPE & SCREEN
ABO/Rh(D): O POS
Antibody screen: NEGATIVE

## 2016-11-13 LAB — HCG URINE, QL. - POC: Pregnancy test,urine (POC): NEGATIVE

## 2016-11-13 MED ORDER — LIDOCAINE (PF) 20 MG/ML (2 %) IJ SOLN
20 mg/mL (2 %) | INTRAMUSCULAR | Status: DC | PRN
Start: 2016-11-13 — End: 2016-11-13
  Administered 2016-11-13 (×2): via INTRAVENOUS

## 2016-11-13 MED ORDER — MIDAZOLAM 1 MG/ML IJ SOLN
1 mg/mL | INTRAMUSCULAR | Status: DC | PRN
Start: 2016-11-13 — End: 2016-11-13

## 2016-11-13 MED ORDER — ROPIVACAINE (PF) 5 MG/ML (0.5 %) INJECTION
5 mg/mL (0. %) | INTRAMUSCULAR | Status: DC | PRN
Start: 2016-11-13 — End: 2016-11-13

## 2016-11-13 MED ORDER — ALBUTEROL SULFATE HFA 90 MCG/ACTUATION AEROSOL INHALER
90 mcg/actuation | RESPIRATORY_TRACT | Status: DC | PRN
Start: 2016-11-13 — End: 2016-11-13

## 2016-11-13 MED ORDER — SODIUM CHLORIDE 0.9 % IV
INTRAVENOUS | Status: DC
Start: 2016-11-13 — End: 2016-11-13

## 2016-11-13 MED ORDER — INSULIN REGULAR HUMAN 100 UNIT/ML INJECTION
100 unit/mL | INTRAMUSCULAR | Status: DC | PRN
Start: 2016-11-13 — End: 2016-11-14
  Administered 2016-11-14: 12:00:00 via SUBCUTANEOUS

## 2016-11-13 MED ORDER — PHENYLEPHRINE 10 MG/250 ML INFUSION
10 mg/250 mL (40 mcg/mL) | INTRAVENOUS | Status: AC
Start: 2016-11-13 — End: ?

## 2016-11-13 MED ORDER — DEXTROSE 50% IN WATER (D50W) IV SYRG
INTRAVENOUS | Status: DC | PRN
Start: 2016-11-13 — End: 2016-11-14

## 2016-11-13 MED ORDER — ACETAMINOPHEN 1,000 MG/100 ML (10 MG/ML) IV
1000 mg/100 mL (10 mg/mL) | INTRAVENOUS | Status: AC
Start: 2016-11-13 — End: ?

## 2016-11-13 MED ORDER — LACTATED RINGERS IV
INTRAVENOUS | Status: DC
Start: 2016-11-13 — End: 2016-11-13

## 2016-11-13 MED ORDER — HYDROMORPHONE 2 MG/ML INJECTION SOLUTION
2 mg/mL | INTRAMUSCULAR | Status: DC | PRN
Start: 2016-11-13 — End: 2016-11-14
  Administered 2016-11-13: 23:00:00 via INTRAVENOUS

## 2016-11-13 MED ORDER — GLYCOPYRROLATE 0.2 MG/ML IJ SOLN
0.2 mg/mL | Freq: Once | INTRAMUSCULAR | Status: DC | PRN
Start: 2016-11-13 — End: 2016-11-13

## 2016-11-13 MED ORDER — MIDAZOLAM 1 MG/ML IJ SOLN
1 mg/mL | INTRAMUSCULAR | Status: AC
Start: 2016-11-13 — End: ?

## 2016-11-13 MED ORDER — FENTANYL CITRATE (PF) 50 MCG/ML IJ SOLN
50 mcg/mL | INTRAMUSCULAR | Status: DC | PRN
Start: 2016-11-13 — End: 2016-11-13

## 2016-11-13 MED ORDER — SODIUM CHLORIDE 0.9 % IJ SYRG
Freq: Three times a day (TID) | INTRAMUSCULAR | Status: DC
Start: 2016-11-13 — End: 2016-11-13

## 2016-11-13 MED ORDER — ACETAMINOPHEN 1,000 MG/100 ML (10 MG/ML) IV
1000 mg/100 mL (10 mg/mL) | INTRAVENOUS | Status: DC | PRN
Start: 2016-11-13 — End: 2016-11-13
  Administered 2016-11-13: 13:00:00 via INTRAVENOUS

## 2016-11-13 MED ORDER — LIDOCAINE (PF) 10 MG/ML (1 %) IJ SOLN
10 mg/mL (1 %) | INTRAMUSCULAR | Status: DC | PRN
Start: 2016-11-13 — End: 2016-11-13

## 2016-11-13 MED ORDER — EPHEDRINE SULFATE 50 MG/ML INJECTION SOLUTION
50 mg/mL | INTRAMUSCULAR | Status: DC | PRN
Start: 2016-11-13 — End: 2016-11-13

## 2016-11-13 MED ORDER — ASPIRIN 81 MG TAB, DELAYED RELEASE
81 mg | Freq: Every evening | ORAL | Status: DC
Start: 2016-11-13 — End: 2016-11-14

## 2016-11-13 MED ORDER — CEFAZOLIN 2 GRAM/20 ML IN STERILE WATER INTRAVENOUS SYRINGE
2 gram/0 mL | Freq: Once | INTRAVENOUS | Status: AC
Start: 2016-11-13 — End: 2016-11-13
  Administered 2016-11-13: 14:00:00 via INTRAVENOUS

## 2016-11-13 MED ORDER — FENTANYL CITRATE (PF) 50 MCG/ML IJ SOLN
50 mcg/mL | INTRAMUSCULAR | Status: DC | PRN
Start: 2016-11-13 — End: 2016-11-13
  Administered 2016-11-13: 18:00:00 via INTRAVENOUS

## 2016-11-13 MED ORDER — ONDANSETRON (PF) 4 MG/2 ML INJECTION
4 mg/2 mL | INTRAMUSCULAR | Status: DC | PRN
Start: 2016-11-13 — End: 2016-11-13

## 2016-11-13 MED ORDER — ZOLPIDEM 5 MG TAB
5 mg | Freq: Every evening | ORAL | Status: DC | PRN
Start: 2016-11-13 — End: 2016-11-14

## 2016-11-13 MED ORDER — DEXAMETHASONE SODIUM PHOSPHATE 4 MG/ML IJ SOLN
4 mg/mL | INTRAMUSCULAR | Status: DC | PRN
Start: 2016-11-13 — End: 2016-11-13
  Administered 2016-11-13: 14:00:00 via INTRAVENOUS

## 2016-11-13 MED ORDER — HYDROCODONE-ACETAMINOPHEN 5 MG-325 MG TAB
5-325 mg | ORAL | Status: DC | PRN
Start: 2016-11-13 — End: 2016-11-13

## 2016-11-13 MED ORDER — BUPIVACAINE (PF) 0.5 % (5 MG/ML) IJ SOLN
0.5 % (5 mg/mL) | INTRAMUSCULAR | Status: DC | PRN
Start: 2016-11-13 — End: 2016-11-13
  Administered 2016-11-13: 14:00:00 via SUBCUTANEOUS

## 2016-11-13 MED ORDER — SODIUM CHLORIDE 0.9 % IJ SYRG
Freq: Three times a day (TID) | INTRAMUSCULAR | Status: DC
Start: 2016-11-13 — End: 2016-11-14
  Administered 2016-11-13 – 2016-11-14 (×3): via INTRAVENOUS

## 2016-11-13 MED ORDER — FENTANYL CITRATE (PF) 50 MCG/ML IJ SOLN
50 mcg/mL | INTRAMUSCULAR | Status: DC | PRN
Start: 2016-11-13 — End: 2016-11-13
  Administered 2016-11-13: 14:00:00 via INTRAVENOUS

## 2016-11-13 MED ORDER — OXYCODONE-ACETAMINOPHEN 5 MG-325 MG TAB
5-325 mg | ORAL | Status: DC | PRN
Start: 2016-11-13 — End: 2016-11-14

## 2016-11-13 MED ORDER — KETOROLAC TROMETHAMINE 30 MG/ML INJECTION
30 mg/mL (1 mL) | Freq: Four times a day (QID) | INTRAMUSCULAR | Status: AC
Start: 2016-11-13 — End: 2016-11-14
  Administered 2016-11-13 – 2016-11-14 (×4): via INTRAVENOUS

## 2016-11-13 MED ORDER — FAMOTIDINE 20 MG TAB
20 mg | Freq: Two times a day (BID) | ORAL | Status: DC
Start: 2016-11-13 — End: 2016-11-14
  Administered 2016-11-14: 15:00:00 via ORAL

## 2016-11-13 MED ORDER — SUGAMMADEX 100 MG/ML INTRAVENOUS SOLUTION
100 mg/mL | INTRAVENOUS | Status: AC
Start: 2016-11-13 — End: 2016-11-13
  Administered 2016-11-13: 17:00:00 via INTRAVENOUS

## 2016-11-13 MED ORDER — ALBUTEROL SULFATE 0.083 % (0.83 MG/ML) SOLN FOR INHALATION
2.5 mg /3 mL (0.083 %) | RESPIRATORY_TRACT | Status: DC | PRN
Start: 2016-11-13 — End: 2016-11-14

## 2016-11-13 MED ORDER — RANITIDINE 150 MG TAB
150 mg | Freq: Two times a day (BID) | ORAL | Status: DC
Start: 2016-11-13 — End: 2016-11-13

## 2016-11-13 MED ORDER — DIPHENHYDRAMINE 12.5 MG/5 ML ELIXIR
12.5 mg/5 mL | Freq: Once | ORAL | Status: AC
Start: 2016-11-13 — End: 2016-11-13
  Administered 2016-11-13: 19:00:00 via ORAL

## 2016-11-13 MED ORDER — GLUCOSE 4 GRAM CHEWABLE TAB
4 gram | ORAL | Status: DC | PRN
Start: 2016-11-13 — End: 2016-11-14

## 2016-11-13 MED ORDER — ALBUTEROL SULFATE 0.083 % (0.83 MG/ML) SOLN FOR INHALATION
2.5 mg /3 mL (0.083 %) | RESPIRATORY_TRACT | Status: DC | PRN
Start: 2016-11-13 — End: 2016-11-13

## 2016-11-13 MED ORDER — MORPHINE 10 MG/ML INJ SOLUTION
10 mg/ml | INTRAMUSCULAR | Status: DC | PRN
Start: 2016-11-13 — End: 2016-11-13

## 2016-11-13 MED ORDER — FENTANYL CITRATE (PF) 50 MCG/ML IJ SOLN
50 mcg/mL | INTRAMUSCULAR | Status: AC
Start: 2016-11-13 — End: ?

## 2016-11-13 MED ORDER — LACTATED RINGERS IV
INTRAVENOUS | Status: DC
Start: 2016-11-13 — End: 2016-11-13
  Administered 2016-11-13: via INTRAVENOUS

## 2016-11-13 MED ORDER — HYDROMORPHONE (PF) 2 MG/ML IJ SOLN
2 mg/mL | INTRAMUSCULAR | Status: AC
Start: 2016-11-13 — End: ?

## 2016-11-13 MED ORDER — CEFAZOLIN 2 GRAM/20 ML IN STERILE WATER INTRAVENOUS SYRINGE
2 gram/0 mL | INTRAVENOUS | Status: AC
Start: 2016-11-13 — End: ?

## 2016-11-13 MED ORDER — ONDANSETRON (PF) 4 MG/2 ML INJECTION
4 mg/2 mL | INTRAMUSCULAR | Status: DC | PRN
Start: 2016-11-13 — End: 2016-11-14
  Administered 2016-11-13: 20:00:00 via INTRAVENOUS

## 2016-11-13 MED ORDER — SODIUM CHLORIDE 0.9 % IJ SYRG
INTRAMUSCULAR | Status: DC | PRN
Start: 2016-11-13 — End: 2016-11-13

## 2016-11-13 MED ORDER — SODIUM CHLORIDE 0.9 % IJ SYRG
INTRAMUSCULAR | Status: DC | PRN
Start: 2016-11-13 — End: 2016-11-14

## 2016-11-13 MED ORDER — NALOXONE 0.4 MG/ML INJECTION
0.4 mg/mL | INTRAMUSCULAR | Status: DC | PRN
Start: 2016-11-13 — End: 2016-11-14

## 2016-11-13 MED ORDER — ROCURONIUM 10 MG/ML IV
10 mg/mL | INTRAVENOUS | Status: DC | PRN
Start: 2016-11-13 — End: 2016-11-13
  Administered 2016-11-13 (×7): via INTRAVENOUS

## 2016-11-13 MED ORDER — HYDROMORPHONE (PF) 2 MG/ML IJ SOLN
2 mg/mL | INTRAMUSCULAR | Status: DC | PRN
Start: 2016-11-13 — End: 2016-11-13
  Administered 2016-11-13: 17:00:00 via INTRAVENOUS

## 2016-11-13 MED ORDER — CEFAZOLIN 2 GRAM/20 ML IN STERILE WATER INTRAVENOUS SYRINGE
2 gram/0 mL | Freq: Once | INTRAVENOUS | Status: AC
Start: 2016-11-13 — End: 2016-11-13
  Administered 2016-11-14: via INTRAVENOUS

## 2016-11-13 MED ORDER — SUCCINYLCHOLINE CHLORIDE 20 MG/ML INJECTION
20 mg/mL | INTRAMUSCULAR | Status: DC | PRN
Start: 2016-11-13 — End: 2016-11-13
  Administered 2016-11-13: 14:00:00 via INTRAVENOUS

## 2016-11-13 MED ORDER — LEVOTHYROXINE 50 MCG TAB
50 mcg | Freq: Every day | ORAL | Status: DC
Start: 2016-11-13 — End: 2016-11-14
  Administered 2016-11-14: 12:00:00 via ORAL

## 2016-11-13 MED ORDER — ROPIVACAINE (PF) 5 MG/ML (0.5 %) INJECTION
5 mg/mL (0. %) | Freq: Once | INTRAMUSCULAR | Status: DC
Start: 2016-11-13 — End: 2016-11-13

## 2016-11-13 MED ORDER — LACTATED RINGERS IV
INTRAVENOUS | Status: DC
Start: 2016-11-13 — End: 2016-11-13
  Administered 2016-11-13 (×2): via INTRAVENOUS

## 2016-11-13 MED ORDER — LACTATED RINGERS IV
INTRAVENOUS | Status: DC
Start: 2016-11-13 — End: 2016-11-13
  Administered 2016-11-13 (×2): via INTRAVENOUS

## 2016-11-13 MED ORDER — MAGNESIUM SULFATE 2 GRAM/50 ML IVPB
2 gram/50 mL (4 %) | INTRAVENOUS | Status: AC
Start: 2016-11-13 — End: ?

## 2016-11-13 MED ORDER — DEXMEDETOMIDINE 100 MCG/ML IV SOLN
100 mcg/mL | INTRAVENOUS | Status: AC
Start: 2016-11-13 — End: ?

## 2016-11-13 MED ORDER — SCOPOLAMINE (1.3-1.5) MG 72 HR TRANSDERM PATCH
1 mg over 3 days | TRANSDERMAL | Status: AC
Start: 2016-11-13 — End: ?

## 2016-11-13 MED ORDER — DEXMEDETOMIDINE 400 MCG/100 ML (4 MCG/ML) IN 0.9 % SODIUM CHLORIDE IV
400 mcg/100 mL (4 mcg/mL) | INTRAVENOUS | Status: DC | PRN
Start: 2016-11-13 — End: 2016-11-13
  Administered 2016-11-13 (×8): via INTRAVENOUS

## 2016-11-13 MED ORDER — KETAMINE 10 MG/ML IJ SOLN
10 mg/mL | INTRAMUSCULAR | Status: DC | PRN
Start: 2016-11-13 — End: 2016-11-13
  Administered 2016-11-13: 14:00:00 via INTRAVENOUS

## 2016-11-13 MED ORDER — GLUCAGON 1 MG INJECTION
1 mg | INTRAMUSCULAR | Status: DC | PRN
Start: 2016-11-13 — End: 2016-11-14

## 2016-11-13 MED ORDER — MAGNESIUM SULFATE 2 GRAM/50 ML IVPB
2 gram/50 mL (4 %) | INTRAVENOUS | Status: DC | PRN
Start: 2016-11-13 — End: 2016-11-13
  Administered 2016-11-13: 15:00:00 via INTRAVENOUS

## 2016-11-13 MED ORDER — ALBUMIN, HUMAN 5 % IV
5 % | INTRAVENOUS | Status: DC | PRN
Start: 2016-11-13 — End: 2016-11-13
  Administered 2016-11-13: 15:00:00 via INTRAVENOUS

## 2016-11-13 MED ORDER — KETAMINE 50 MG/5 ML (10 MG/ML) IN NS IV SYRINGE
50 mg/5 mL (10 mg/mL) | INTRAVENOUS | Status: AC
Start: 2016-11-13 — End: ?

## 2016-11-13 MED ORDER — DOCUSATE SODIUM 100 MG CAP
100 mg | Freq: Two times a day (BID) | ORAL | Status: DC
Start: 2016-11-13 — End: 2016-11-14
  Administered 2016-11-14: 15:00:00 via ORAL

## 2016-11-13 MED ORDER — PROPOFOL 10 MG/ML IV EMUL
10 mg/mL | INTRAVENOUS | Status: DC | PRN
Start: 2016-11-13 — End: 2016-11-13
  Administered 2016-11-13 (×4): via INTRAVENOUS

## 2016-11-13 MED ORDER — MIDAZOLAM 1 MG/ML IJ SOLN
1 mg/mL | INTRAMUSCULAR | Status: DC | PRN
Start: 2016-11-13 — End: 2016-11-13
  Administered 2016-11-13: 13:00:00 via INTRAVENOUS

## 2016-11-13 MED ORDER — SCOPOLAMINE (1.3-1.5) MG 72 HR TRANSDERM PATCH
1 mg over 3 days | TRANSDERMAL | Status: AC
Start: 2016-11-13 — End: 2016-11-13
  Administered 2016-11-13: 13:00:00 via TRANSDERMAL

## 2016-11-13 MED FILL — KETOROLAC TROMETHAMINE 30 MG/ML INJECTION: 30 mg/mL (1 mL) | INTRAMUSCULAR | Qty: 1

## 2016-11-13 MED FILL — LACTATED RINGERS IV: INTRAVENOUS | Qty: 1000

## 2016-11-13 MED FILL — DEXAMETHASONE SODIUM PHOSPHATE 4 MG/ML IJ SOLN: 4 mg/mL | INTRAMUSCULAR | Qty: 2

## 2016-11-13 MED FILL — FENTANYL CITRATE (PF) 50 MCG/ML IJ SOLN: 50 mcg/mL | INTRAMUSCULAR | Qty: 2

## 2016-11-13 MED FILL — SODIUM CHLORIDE 0.9 % IV: INTRAVENOUS | Qty: 1000

## 2016-11-13 MED FILL — HYDROMORPHONE 2 MG/ML INJECTION SOLUTION: 2 mg/mL | INTRAMUSCULAR | Qty: 1

## 2016-11-13 MED FILL — DIPHENHYDRAMINE 12.5 MG/5 ML ELIXIR: 12.5 mg/5 mL | ORAL | Qty: 5

## 2016-11-13 MED FILL — CEFAZOLIN 2 GRAM/20 ML IN STERILE WATER INTRAVENOUS SYRINGE: 2 gram/0 mL | INTRAVENOUS | Qty: 20

## 2016-11-13 MED FILL — PRECEDEX 100 MCG/ML INTRAVENOUS SOLUTION: 100 mcg/mL | INTRAVENOUS | Qty: 2

## 2016-11-13 MED FILL — PRECEDEX 100 MCG/ML INTRAVENOUS SOLUTION: 100 mcg/mL | INTRAVENOUS | Qty: 32

## 2016-11-13 MED FILL — KETAMINE 50 MG/5 ML (10 MG/ML) IN NS IV SYRINGE: 50 mg/5 mL (10 mg/mL) | INTRAVENOUS | Qty: 5

## 2016-11-13 MED FILL — QUELICIN 20 MG/ML INJECTION SOLUTION: 20 mg/mL | INTRAMUSCULAR | Qty: 8

## 2016-11-13 MED FILL — DIPRIVAN 10 MG/ML INTRAVENOUS EMULSION: 10 mg/mL | INTRAVENOUS | Qty: 20

## 2016-11-13 MED FILL — BRIDION 100 MG/ML INTRAVENOUS SOLUTION: 100 mg/mL | INTRAVENOUS | Qty: 1.77

## 2016-11-13 MED FILL — ONDANSETRON (PF) 4 MG/2 ML INJECTION: 4 mg/2 mL | INTRAMUSCULAR | Qty: 2

## 2016-11-13 MED FILL — MIDAZOLAM 1 MG/ML IJ SOLN: 1 mg/mL | INTRAMUSCULAR | Qty: 4

## 2016-11-13 MED FILL — XYLOCAINE-MPF 20 MG/ML (2 %) INJECTION SOLUTION: 20 mg/mL (2 %) | INTRAMUSCULAR | Qty: 5

## 2016-11-13 MED FILL — HYDROMORPHONE (PF) 2 MG/ML IJ SOLN: 2 mg/mL | INTRAMUSCULAR | Qty: 1

## 2016-11-13 MED FILL — NORMAL SALINE FLUSH 0.9 % INJECTION SYRINGE: INTRAMUSCULAR | Qty: 10

## 2016-11-13 MED FILL — OFIRMEV 1,000 MG/100 ML (10 MG/ML) INTRAVENOUS SOLUTION: 1000 mg/100 mL (10 mg/mL) | INTRAVENOUS | Qty: 100

## 2016-11-13 MED FILL — ALBUTEIN 5 % INTRAVENOUS SOLUTION: 5 % | INTRAVENOUS | Qty: 250

## 2016-11-13 MED FILL — TRANSDERM-SCOP 1 MG OVER 3 DAYS TRANSDERMAL PATCH: 1 mg over 3 days | TRANSDERMAL | Qty: 1

## 2016-11-13 MED FILL — PHENYLEPHRINE 10 MG/250 ML INFUSION: 10 mg/250 mL (40 mcg/mL) | INTRAVENOUS | Qty: 250

## 2016-11-13 MED FILL — KETAMINE 10 MG/ML IJ SOLN: 10 mg/mL | INTRAMUSCULAR | Qty: 3

## 2016-11-13 MED FILL — OFIRMEV 1,000 MG/100 ML (10 MG/ML) INTRAVENOUS SOLUTION: 1000 mg/100 mL (10 mg/mL) | INTRAVENOUS | Qty: 200

## 2016-11-13 MED FILL — MIDAZOLAM 1 MG/ML IJ SOLN: 1 mg/mL | INTRAMUSCULAR | Qty: 2

## 2016-11-13 MED FILL — MAGNESIUM SULFATE 2 GRAM/50 ML IVPB: 2 gram/50 mL (4 %) | INTRAVENOUS | Qty: 50

## 2016-11-13 MED FILL — ROCURONIUM 10 MG/ML IV: 10 mg/mL | INTRAVENOUS | Qty: 11

## 2016-11-13 NOTE — Op Note (Signed)
[facility]  OPERATIVE REPORT    CECILIA, VANCLEVE  MR#: [id number]  DOB: 07/01/1965  ACCOUNT #: [id number]   DATE OF SERVICE: 11/13/2016    PREOPERATIVE DIAGNOSIS:  Fibroids and menorrhagia.    POSTOPERATIVE DIAGNOSIS:  Fibroids and menorrhagia, plus endometriosis.    PROCEDURE PERFORMED:  Total laparoscopic hysterectomy with bilateral salpingo-oophorectomy.    SURGEON:  Lissa Morales, MD    ASSISTANTRoanna Banning    ANESTHESIA:  General.    ESTIMATED BLOOD LOSS:  100 mL.    SPECIMENS REMOVED:  Uterus, cervix and bilateral tubes and ovaries.    FINDINGS:  There was a fibroid uterus with a large anterior pedunculated fibroid.  The tubes and ovaries appeared normal.  The abdomen and pelvis appeared normal with the exception of a small omental adhesion to the anterior abdominal wall at the umbilicus and some implants that appeared consistent with endometriosis in the anterior cul-de-sac.    INDICATIONS:  The patient is a 51 year old G0 who has had a long history of menorrhagia and dysmenorrhea with known fibroids.  She has tried other management methods which have failed and she desires definitive surgical therapy.      COMPLICATIONS:  None.    IMPLANTS:  none    DESCRIPTION OF PROCEDURE:  After informed consent was obtained, the patient was taken to the operating suite and underwent general anesthesia.  She was positioned on the table with legs in [place] and she was prepped and draped in the usual sterile fashion.  Attention was first turned below where a Foley catheter was inserted and a speculum was inserted in the vagina.  A tenaculum was used to grasp the anterior lip of the cervix and the uterus was sounded.  A medium sized VCare manipulator was inserted without difficulty.  Tenaculum was removed and the speculum was removed.  Attention was then turned to the abdomen where 0.5% Marcaine without epinephrine was injected at the umbilicus and a 5 mm incision was made.  The  Veress needle was inserted without difficulty and the saline drop test confirmed intraperitoneal placement.  Pneumoperitoneum was obtained without incident.  The Veress needle was then removed and a 5 mm trocar and camera were inserted under direct visualization.  There was noted to be a thin omental adhesion attached at the umbilicus, which was not in the way of visualization.  5 mm incisions and trocars were inserted in bilateral lower quadrants without difficulty.  The patient was positioned in steep Trendelenburg.  Initial survey of the pelvis revealed findings as noted above.  There were no significant adhesions of the pelvic organs.  However, there were several implants that appeared consistent with endometriosis, mostly in the anterior cul-de-sac.  The left fallopian tube and ovary were elevated.  The pelvic sidewall was inspected.  The peritoneum was quite thickened with subperitoneal fat and therefore the ureter could not be visualized; however, dissection was initiated close to the ovary in the area where it was expected to be a free dissection.  The EnSeal device was used to transect the IP ligament and come across the broad ligament to free up the fallopian tube.  This dissection was then carried down along the left border of the uterus down past the level of the left uterine artery.  Good hemostasis was noted.  Attention was then turned to the right side where the same procedure was performed on the right.  Dissection was taken across anteriorly to form the bladder  flap sharply and pedicles were created bilaterally with the EnSeal to take the dissection a little bit further down to the level of the VCare cup.  The bladder flap required just a little bit of blunt dissection to push it down below the level of the cup and then the Harmonic scalpel was used to create a colpotomy incision, which was then carried around circumferentially at the level of the VCare cup to free up the entire specimen.  Attention  was then turned back to the vagina where the entire specimen was removed intact through the vagina and a bulb syringe was placed in the vagina to maintain pneumoperitoneum.  Going back to the abdominal dissection, the RG180 device was used to place 5 interrupted stitches to close the vaginal cuff.  These sutures were secured with the Ti-Knot device.  A good closure of the cuff was noted as well as hemostasis throughout the pelvis.  The pelvis was irrigated and aspirated and no bleeding was noted.  FloSeal was applied over the cut surfaces for continued hemostasis.  The pneumoperitoneum was deflated and the trocars were removed under visualization with the camera.  The incisions were closed with 4-0 Vicryl in a subcutaneous fashion and Dermabond was applied.  The bulb syringe was removed from the vagina.  The patient was awakened and taken to recovery in stable condition.  There were no immediate complications.      Lissa Morales, MD       [facility] / [place]  D: 11/13/2016 12:16     T: 11/13/2016 15:36  JOB #: [id number]

## 2016-11-13 NOTE — Op Note (Signed)
[facility]  OPERATIVE REPORT    Katrina, [name].  MR#: [id number]  DOB: [birth year]  ACCOUNT #: [id number]   DATE OF SERVICE: 11/13/2016    PREOPERATIVE DIAGNOSIS:  Vaginal bleeding, status post total laparoscopic hysterectomy.    POSTOPERATIVE DIAGNOSIS:  Vaginal bleeding, status post total laparoscopic hysterectomy plus vaginal laceration.    PROCEDURE PERFORMED:  Exam under anesthesia and repair of vaginal laceration.    SURGEON:  Lissa Morales, MD    ASSISTANT:  none    ANESTHESIA:  General endotracheal.    ESTIMATED BLOOD LOSS:  50 mL.    SPECIMENS REMOVED:  None.    FINDINGS:  There was a right vaginal sidewall laceration which was bleeding.  The cuff was intact.    COMPLICATIONS:  None.    IMPLANTS:  none    INDICATIONS:  The patient is a 51 year old G0 who underwent a total laparoscopic hysterectomy earlier in the day.  About 4 hours postop, she began having some heavy vaginal bleeding and she was taken to the operating room for exam under anesthesia and necessary repair.    DESCRIPTION OF PROCEDURE:  After informed consent was obtained, the patient was taken to the operating suite and underwent general anesthesia.  Her feet were placed in [place] and she was prepped and draped in the usual sterile fashion.  A Foley catheter was already in place.  A weighted speculum was placed in the patient's vagina and a right angle retractor was used for visualization.  There was a large amount of clot in the vagina which was removed and it was irrigated.  A right vaginal sidewall laceration was observed which was obviously bleeding.  This was repaired with a running locking 2-0 Vicryl.  Good hemostasis was noted there.  The vaginal cuff was inspected and was noted to be hemostatic.  There was a slightly denuded area on the edges in the midline which was not bleeding but this was just sutured for reassurance with 1 stitch of 2-0 Vicryl.  There was actually a small left vaginal  sidewall laceration just at the level of the hymenal ring and 1 in the midline at around 6 o'clock, which were created by the vaginal retractor a the time of this subsequent procedure and these were repaired with just 1 figure-of-eight stitch each of 3-0 Vicryl.  Good hemostasis was then noted.  The vagina was well irrigated and observed and it was noted to be completely dry without any bleeding.  The patient was awakened and taken to recovery in stable condition.  There were no immediate complications.      Lissa Morales, MD       [facility] / [place]  D: 11/13/2016 20:20     T: 11/14/2016 08:37  JOB #: [id number]

## 2016-11-13 NOTE — Brief Op Note (Signed)
BRIEF OPERATIVE NOTE    Date of Procedure: 11/13/2016   Preoperative Diagnosis: FIBROIDS/MENORRHAGIA  Postoperative Diagnosis: FIBROIDS/MENORRHAGIA , endometriosis  Procedure(s):  TOTAL LAPAROSCOPIC HYSTERECTOMY/BSO  Surgeon(s) and Role:     * Gerre Pebbles, MD - Primary     * Hartle, Mellissa Kohut, DO - Assisting         Surgical Staff:  Circ-1: Talmage Coin, RN  Scrub Tech-1: Shea Stakes  Event Time In Time Out   Incision Start 0914    Incision Close 1153      Anesthesia: General   Estimated Blood Loss: 100cc  Specimens:   ID Type Source Tests Collected by Time Destination   1 : UTERUS, CERVIX, BILATERAL TUBES AND OVARIES Fresh Uterus with Bilateral Fallopian tubes and Ovaries  Gerre Pebbles, MD 11/13/2016 1130 Pathology      Findings: Fibroid uterus with large anterior pedunculated fibroid, normal tubes and ovaries, several implants consistent with endometriosis in the anterior cul-de-sac, normal abdomen with exception of small omental adhesion to anterior abdominal wall at umbilicus  Complications: none  Implants: * No implants in log *  Dict [id number]

## 2016-11-13 NOTE — Other (Signed)
Patient: Katrina [name] MRN: [id number]  SSN: [id number]   Date of Birth: 1965/06/05  Age: 51 y.o.  Sex: female     Patient is status post Procedure(s):  TOTAL LAPAROSCOPIC HYSTERECTOMY.    Surgeon(s) and Role:     * Gerre Pebbles, MD - Primary     * Hartle, Mellissa Kohut, DO - Assisting    Local/Dose/Irrigation: 0.5 BUPIVACAINE                                         Dressing/Packing:  Wound Abdomen-DRESSING TYPE: Topical skin adhesive/glue;Peri-pad (11/13/16 1100)  Splint/Cast:  ]    Other:

## 2016-11-13 NOTE — Brief Op Note (Signed)
BRIEF OPERATIVE NOTE    Date of Procedure: 11/13/2016   Preoperative Diagnosis: Vaginal Bleeding s/p TLH  Postoperative Diagnosis: same, vaginal laceration  Procedure(s):  EUA, repair of vaginal laceration  Surgeon(s) and Role:     * Gerre Pebbles, MD - Primary         Surgical Assistant: staff PA    Surgical Staff:  Circ-1: Kelli Hope, RN  Scrub RN-1: Gae Dry, RN  Surg Asst-1: Limmie Patricia H  Event Time In Time Out   Incision Start 1923    Incision Close 2007      Anesthesia: GET   Estimated Blood Loss: 50cc  Specimens: * No specimens in log *   Findings: right vaginal sidewall laceration which was bleeding, cuff intact   Complications: none  Implants: * No implants in log *  Dict # [id number]

## 2016-11-13 NOTE — Anesthesia Post-Procedure Evaluation (Signed)
Post-Anesthesia Evaluation and Assessment  Patient: Katrina [name] MRN: [id number]  SSN: [id number]    Date of Birth: 02-23-1965  Age: 51 y.o.  Sex: female      I have evaluated the patient and they are stable and ready for discharge from the PACU.     Cardiovascular Function/Vital Signs  Visit Vitals  BP 130/67   Pulse 86   Temp 36.9 ??C (98.5 ??F)   Resp 14   Ht 5' 5.5" (1.664 m)   Wt 88.6 kg (195 lb 4 oz)   SpO2 95%   BMI 32.00 kg/m??       Patient is status post MAC anesthesia for Procedure(s):  EUA WITH REPAIR OF VAGINAL LACERATION.    Nausea/Vomiting: None    Postoperative hydration reviewed and adequate.    Pain:  Pain Scale 1: Numeric (0 - 10) (11/13/16 2045)  Pain Intensity 1: 0 (11/13/16 2045)   Managed    Neurological Status:   Neuro (WDL): Within Defined Limits (11/13/16 2045)  Neuro  Neurologic State: Drowsy (11/13/16 2045)  LUE Motor Response: Purposeful;Spontaneous  (11/13/16 1250)  LLE Motor Response: Purposeful;Spontaneous  (11/13/16 1250)  RUE Motor Response: Purposeful;Spontaneous  (11/13/16 1250)  RLE Motor Response: Purposeful;Spontaneous  (11/13/16 1250)   At baseline    Mental Status, Level of Consciousness: Alert and  oriented to person, place, and time    Pulmonary Status:   O2 Device: Nasal cannula (11/13/16 2045)   Adequate oxygenation and airway patent    Complications related to anesthesia: None    Post-anesthesia assessment completed. No concerns    Signed By: Eber Jones, MD     November 13, 2016              Procedure(s):  EUA WITH REPAIR OF VAGINAL LACERATION.    <BSHSIANPOST>    Visit Vitals  BP 130/67   Pulse 86   Temp 36.9 ??C (98.5 ??F)   Resp 14   Ht 5' 5.5" (1.664 m)   Wt 88.6 kg (195 lb 4 oz)   SpO2 95%   BMI 32.00 kg/m??

## 2016-11-13 NOTE — Progress Notes (Signed)
Gynecology Progress Note    Patient doing well post-op day 0 from Procedure(s):  TOTAL LAPAROSCOPIC HYSTERECTOMY, BILATERAL SALPINGOOPHERECTOMY   Had some nausea but better now.  Pain controlled on current medication.   Nursing reports pt had large amount of vaginal bleeding around 1600 and again around 1700. (I examined the chux).  No bleeding was noted earlier in the day    Vitals:  Blood pressure 150/80, pulse 62, temperature 98.1 ??F (36.7 ??C), resp. rate 16, height 5' 5.5" (1.664 m), weight 88.6 kg (195 lb 4 oz), last menstrual period 11/01/2016, SpO2 96 %.  Temp (24hrs), Avg:98.2 ??F (36.8 ??C), Min:97.6 ??F (36.4 ??C), Max:98.9 ??F (37.2 ??C)  450 +250cc UOP total per nurse      Exam:  Patient without distress.  RRR, LCTAB               Abdomen soft,  nontender.                 Incisions dry and clean without erythema.               Lower extremities are negative for swelling, cords, or tenderness. +SCDs  Attempted speculum exam- large amount of clot in vagina which limited visualization and some active bleeding, exam limited by pt discomfort and no stirrups/pelvic bed.    Lab/Data Review:    Assessment and Plan:   POD 0 from TOTAL LAPAROSCOPIC HYSTERECTOMY, BILATERAL SALPINGOOPHERECTOMY   Vaginal bleeding worrisome for bleeding from cuff.  Will return to OR for EUA and any indicated repair.  Discussed with pt.

## 2016-11-13 NOTE — Other (Signed)
UPDATE WITH PATIENT'S SISTER (PAULA) AT [phone].

## 2016-11-13 NOTE — Progress Notes (Signed)
Bedside and Verbal shift change report given to Verdia Kuba, RN (oncoming nurse) by Donata Clay, RN (offgoing nurse). Report included the following information SBAR, Kardex, Procedure Summary, Intake/Output, MAR, Accordion and Recent Results.

## 2016-11-13 NOTE — Progress Notes (Addendum)
2:33p - pt complaints of nausea, pt given zofran, and Quease.   Verified with pharmacy safety of zofran do to patient's cardiac hx.       4:30p - Pt. Having Large amount of vaginal bleeding, 1-2 fragmented clots. Pt. Cleaned up, new pad & chuck pad placed. Vital signs stable and pt denies any complaints at this time. Will continue to closely monitor.     5:15p- pt having increased bleeding, passing several large clots.       MD Coble at bedside assessing pt.         6:37p - pt off floor to OR with transport.

## 2016-11-13 NOTE — Anesthesia Post-Procedure Evaluation (Signed)
Post-Anesthesia Evaluation and Assessment  Patient: Katrina [name] MRN: [id number]  SSN: [id number]    Date of Birth: January 22, 1965  Age: 51 y.o.  Sex: female      I have evaluated the patient and they are stable and ready for discharge from the PACU.     Cardiovascular Function/Vital Signs  Visit Vitals  BP 132/64   Pulse 66   Temp 36.6 ??C (97.8 ??F)   Resp 13   Ht 5' 5.5" (1.664 m)   Wt 88.6 kg (195 lb 4 oz)   SpO2 99%   BMI 32.00 kg/m??       Patient is status post General anesthesia for Procedure(s):  TOTAL LAPAROSCOPIC HYSTERECTOMY, BILATERAL SALPINGOOPHERECTOMY.    Nausea/Vomiting: None    Postoperative hydration reviewed and adequate.    Pain:  Pain Scale 1: Numeric (0 - 10) (11/13/16 1306)  Pain Intensity 1: 6 (11/13/16 1306)   Managed    Neurological Status:   Neuro (WDL): Within Defined Limits (11/13/16 1250)  Neuro  Neurologic State: Drowsy;Sleeping (11/13/16 1250)  LUE Motor Response: Purposeful;Spontaneous  (11/13/16 1250)  LLE Motor Response: Purposeful;Spontaneous  (11/13/16 1250)  RUE Motor Response: Purposeful;Spontaneous  (11/13/16 1250)  RLE Motor Response: Purposeful;Spontaneous  (11/13/16 1250)   At baseline    Mental Status, Level of Consciousness: Alert and  oriented to person, place, and time    Pulmonary Status:   O2 Device: Nasal cannula (11/13/16 1300)   Adequate oxygenation and airway patent    Complications related to anesthesia: None    Post-anesthesia assessment completed. No concerns    Signed By: Johnnette Litter, MD     November 13, 2016              Procedure(s):  TOTAL LAPAROSCOPIC HYSTERECTOMY, BILATERAL SALPINGOOPHERECTOMY.    <BSHSIANPOST>    Visit Vitals  BP 132/64   Pulse 66   Temp 36.6 ??C (97.8 ??F)   Resp 13   Ht 5' 5.5" (1.664 m)   Wt 88.6 kg (195 lb 4 oz)   SpO2 99%   BMI 32.00 kg/m??

## 2016-11-13 NOTE — Progress Notes (Signed)
TRANSFER - OUT REPORT:    Verbal report given to [facility] RN(name) on Katrina [name]  being transferred to preop Holding (unit) for ordered procedure       Report consisted of patient???s Situation, Background, Assessment and   Recommendations(SBAR).     Information from the following report(s) SBAR, Kardex, OR Summary, Procedure Summary, Intake/Output and MAR was reviewed with the receiving nurse.    Lines:   Peripheral IV 11/13/16 Left Wrist (Active)   Site Assessment Clean, dry, & intact 11/13/2016  1:25 PM   Phlebitis Assessment 0 11/13/2016  1:25 PM   Infiltration Assessment 0 11/13/2016  1:25 PM   Dressing Status Clean, dry, & intact 11/13/2016  1:25 PM   Dressing Type Transparent;Tape 11/13/2016  1:25 PM   Hub Color/Line Status Infusing 11/13/2016  1:25 PM   Action Taken Open ports on tubing capped 11/13/2016  1:25 PM   Alcohol Cap Used Yes 11/13/2016  1:25 PM        Opportunity for questions and clarification was provided.

## 2016-11-13 NOTE — Other (Signed)
TRANSFER - OUT REPORT:    Verbal report given to [place], RN(name) on Katrina [name]  being transferred to Room 305(unit) for routine post - op       Report consisted of patient???s Situation, Background, Assessment and   Recommendations(SBAR).     Time Pre op antibiotic given:0850  Anesthesia Stop time: 1222  Foley Present on Transfer to floor:yes  Order for Foley on Chart:yes  Discharge Prescriptions with Chart:n/a    Information from the following report(s) SBAR, Kardex, OR Summary, Intake/Output, MAR, Recent Results and Cardiac Rhythm SR with prolonged QT was reviewed with the receiving nurse.    Opportunity for questions and clarification was provided.     Is the patient on 02? YES       L/Min 2    Is the patient on a monitor? NO    Is the nurse transporting with the patient? NO    Surgical Waiting Area notified of patient's transfer from PACU? YES      The following personal items collected during your admission accompanied patient upon transfer:   Dental Appliance: Dental Appliances: None  Vision:    Hearing Aid:    Jewelry:    Clothing: Clothing: Other (comment)(bag of clothes returned to pt in PACU)  Other Valuables:    Valuables sent to safe:

## 2016-11-13 NOTE — Anesthesia Pre-Procedure Evaluation (Addendum)
Anesthetic History     PONV          Review of Systems / Medical History  Patient summary reviewed, nursing notes reviewed and pertinent labs reviewed    Pulmonary        Sleep apnea: CPAP    Asthma        Neuro/Psych              Cardiovascular    Hypertension: well controlled              Exercise tolerance: >4 METS     GI/Hepatic/Renal                Endo/Other    Diabetes: well controlled, type 2  Hypothyroidism  Obesity and arthritis  Pertinent negatives: No morbid obesity   Other Findings   Comments: Vaginal bleeding post hysterectomy         Physical Exam    Airway  Mallampati: II  TM Distance: > 6 cm  Neck ROM: normal range of motion   Mouth opening: Normal     Cardiovascular  Regular rate and rhythm,  S1 and S2 normal,  no murmur, click, rub, or gallop             Dental  No notable dental hx       Pulmonary  Breath sounds clear to auscultation               Abdominal  GI exam deferred       Other Findings            Anesthetic Plan    ASA: 2, emergent  Anesthesia type: general          Induction: Intravenous  Anesthetic plan and risks discussed with: Patient

## 2016-11-13 NOTE — Other (Signed)
TRANSFER - OUT REPORT:    Verbal report given to Paula(name) on Ivar Drape  being transferred to 3E(unit) for routine post - op       Report consisted of patient???s Situation, Background, Assessment and   Recommendations(SBAR).     Time Pre op antibiotic given:1911  Anesthesia Stop time: 2027  Foley Present on Transfer to floor:yes  Order for Foley on Chart:yes  Discharge Prescriptions with Chart:no    Information from the following report(s) SBAR, OR Summary, MAR and Cardiac Rhythm NSR was reviewed with the receiving nurse.    Opportunity for questions and clarification was provided.     Is the patient on 02? YES       L/Min 2         Is the patient on a monitor? NO    Is the nurse transporting with the patient? NO    Surgical Waiting Area notified of patient's transfer from PACU? NO      The following personal items collected during your admission accompanied patient upon transfer:   Dental Appliance: Dental Appliances: None  Vision: Visual Aid: Glasses, With patient, At bedside  Hearing Aid:    Jewelry:    Clothing: Clothing: (no belongings to preop)  Other Valuables:    Valuables sent to safe:

## 2016-11-13 NOTE — Other (Signed)
TRANSFER - IN REPORT:    Verbal report received from [place] RN(name) on [facility]  being received from [facility](unit) for ordered procedure      Report consisted of patient???s Situation, Background, Assessment and   Recommendations(SBAR).     Information from the following report(s) SBAR, Kardex, OR Summary, Procedure Summary, Intake/Output, MAR and Recent Results was reviewed with the receiving nurse.    Opportunity for questions and clarification was provided.      Assessment completed upon patient???s arrival to unit and care assumed.

## 2016-11-13 NOTE — Anesthesia Pre-Procedure Evaluation (Signed)
Anesthetic History     PONV          Review of Systems / Medical History  Patient summary reviewed, nursing notes reviewed and pertinent labs reviewed    Pulmonary        Sleep apnea    Asthma        Neuro/Psych              Cardiovascular                  Exercise tolerance: >4 METS     GI/Hepatic/Renal                Endo/Other    Diabetes: well controlled, type 2  Hypothyroidism  Arthritis     Other Findings              Physical Exam    Airway  Mallampati: II  TM Distance: > 6 cm  Neck ROM: normal range of motion   Mouth opening: Normal     Cardiovascular  Regular rate and rhythm,  S1 and S2 normal,  no murmur, click, rub, or gallop             Dental  No notable dental hx       Pulmonary  Breath sounds clear to auscultation               Abdominal  GI exam deferred       Other Findings            Anesthetic Plan    ASA: 2  Anesthesia type: general          Induction: Intravenous  Anesthetic plan and risks discussed with: Patient

## 2016-11-13 NOTE — Progress Notes (Incomplete)
TRANSFER - IN REPORT:    Verbal report received from [place] (name) on Katrina [name]  being received from PACU (unit) for routine progression of care      Report consisted of patient???s Situation, Background, Assessment and   Recommendations(SBAR).     Information from the following report(s) SBAR was reviewed with the receiving nurse.    Opportunity for questions and clarification was provided.      Assessment completed upon patient???s arrival to unit and care assumed.

## 2016-11-13 NOTE — Other (Signed)
PATIENT INTERVIEWED IN PREOP.  NAME AND ALLERGY BAND VISIBLE AND CORRECT PER PATIENT.  PATIENT HAS UNDERSTANDING OF PROCEDURE AND SURGICAL SITE.  EDUCATIONAL NEEDS MET.  PATIENT STATES NO PAIN AT THIS TIME.

## 2016-11-13 NOTE — Other (Signed)
0.9% NORMAL SALINE, 1 LITER, USED PRN ON STERILE FIELD.

## 2016-11-13 NOTE — Other (Signed)
UPDATE WITH PATIENT'S SISTER (PAULA) AT 1127.

## 2016-11-13 NOTE — Op Note (Signed)
[facility]  OPERATIVE REPORT    ZEKIAH, COEN  MR#: [id number]  DOB: [birth year]  ACCOUNT #: [id number]   DATE OF SERVICE: 11/13/2016    PREOPERATIVE DIAGNOSIS:  Fibroids and menorrhagia.    POSTOPERATIVE DIAGNOSIS:  Fibroids and menorrhagia, plus endometriosis.    PROCEDURE PERFORMED:  Total laparoscopic hysterectomy with bilateral salpingo-oophorectomy.    SURGEON:  Lissa Morales, MD    ASSISTANTRoanna Banning    ANESTHESIA:  General.    ESTIMATED BLOOD LOSS:  100 mL.    SPECIMENS REMOVED:  Uterus, cervix and bilateral tubes and ovaries.    FINDINGS:  There was a fibroid uterus with a large anterior pedunculated fibroid.  The tubes and ovaries appeared normal.  The abdomen and pelvis appeared normal with the exception of a small omental adhesion to the anterior abdominal wall at the umbilicus and some implants that appeared consistent with endometriosis in the anterior cul-de-sac.    INDICATIONS:  The patient is a 51 year old G0 who has had a long history of menorrhagia and dysmenorrhea with known fibroids.  She has tried other management methods which have failed and she desires definitive surgical therapy.      COMPLICATIONS:  None.    IMPLANTS:  none    DESCRIPTION OF PROCEDURE:  After informed consent was obtained, the patient was taken to the operating suite and underwent general anesthesia.  She was positioned on the table with legs in [place] and she was prepped and draped in the usual sterile fashion.  Attention was first turned below where a Foley catheter was inserted and a speculum was inserted in the vagina.  A tenaculum was used to grasp the anterior lip of the cervix and the uterus was sounded.  A medium sized VCare manipulator was inserted without difficulty.  Tenaculum was removed and the speculum was removed.  Attention was then turned to the abdomen where 0.5% Marcaine without epinephrine was injected at the umbilicus and a 5 mm incision was  made.  The Veress needle was inserted without difficulty and the saline drop test confirmed intraperitoneal placement.  Pneumoperitoneum was obtained without incident.  The Veress needle was then removed and a 5 mm trocar and camera were inserted under direct visualization.  There was noted to be a thin omental adhesion attached at the umbilicus, which was not in the way of visualization.  5 mm incisions and trocars were inserted in bilateral lower quadrants without difficulty.  The patient was positioned in steep Trendelenburg.  Initial survey of the pelvis revealed findings as noted above.  There were no significant adhesions of the pelvic organs.  However, there were several implants that appeared consistent with endometriosis, mostly in the anterior cul-de-sac.  The left fallopian tube and ovary were elevated.  The pelvic sidewall was inspected.  The peritoneum was quite thickened with subperitoneal fat and therefore the ureter could not be visualized; however, dissection was initiated close to the ovary in the area where it was expected to be a free dissection.  The EnSeal device was used to transect the IP ligament and come across the broad ligament to free up the fallopian tube.  This dissection was then carried down along the left border of the uterus down past the level of the left uterine artery.  Good hemostasis was noted.  Attention was then turned to the right side where the same procedure was performed on the right.  Dissection was taken across anteriorly to form the bladder  flap sharply and pedicles were created bilaterally with the EnSeal to take the dissection a little bit further down to the level of the VCare cup.  The bladder flap required just a little bit of blunt dissection to push it down below the level of the cup and then the Harmonic scalpel was used to create a colpotomy incision, which was then carried around circumferentially at the level of the VCare cup to free up  the entire specimen.  Attention was then turned back to the vagina where the entire specimen was removed intact through the vagina and a bulb syringe was placed in the vagina to maintain pneumoperitoneum.  Going back to the abdominal dissection, the RG180 device was used to place 5 interrupted stitches to close the vaginal cuff.  These sutures were secured with the Ti-Knot device.  A good closure of the cuff was noted as well as hemostasis throughout the pelvis.  The pelvis was irrigated and aspirated and no bleeding was noted.  FloSeal was applied over the cut surfaces for continued hemostasis.  The pneumoperitoneum was deflated and the trocars were removed under visualization with the camera.  The incisions were closed with 4-0 Vicryl in a subcutaneous fashion and Dermabond was applied.  The bulb syringe was removed from the vagina.  The patient was awakened and taken to recovery in stable condition.  There were no immediate complications.      Lissa Morales, MD       [facility] / LN  D: 11/13/2016 12:16     T: 11/13/2016 15:36  JOB #: [id number]

## 2016-11-13 NOTE — Other (Signed)
Katrina [name], LOT# [id number], EXP 04/03/2018

## 2016-11-14 LAB — CBC W/O DIFF
ABSOLUTE NRBC: 0.02 10*3/uL — ABNORMAL HIGH (ref 0.00–0.01)
HCT: 31.9 % — ABNORMAL LOW (ref 35.0–47.0)
HGB: 10.1 g/dL — ABNORMAL LOW (ref 11.5–16.0)
MCH: 28.6 PG (ref 26.0–34.0)
MCHC: 31.7 g/dL (ref 30.0–36.5)
MCV: 90.4 FL (ref 80.0–99.0)
MPV: 11 FL (ref 8.9–12.9)
NRBC: 0.2 PER 100 WBC — ABNORMAL HIGH
PLATELET: 354 10*3/uL (ref 150–400)
RBC: 3.53 M/uL — ABNORMAL LOW (ref 3.80–5.20)
RDW: 13.2 % (ref 11.5–14.5)
WBC: 13.1 10*3/uL — ABNORMAL HIGH (ref 3.6–11.0)

## 2016-11-14 LAB — GLUCOSE, POC
Glucose (POC): 138 mg/dL — ABNORMAL HIGH (ref 65–100)
Glucose (POC): 150 mg/dL — ABNORMAL HIGH (ref 65–100)
Glucose (POC): 107 mg/dL — ABNORMAL HIGH (ref 65–100)

## 2016-11-14 MED ORDER — MEPERIDINE (PF) 50 MG/ML INJ SOLN
50 mg/ml | INTRAMUSCULAR | Status: AC
Start: 2016-11-14 — End: 2016-11-13

## 2016-11-14 MED ORDER — LACTATED RINGERS IV
INTRAVENOUS | Status: DC
Start: 2016-11-14 — End: 2016-11-14
  Administered 2016-11-14 (×2): via INTRAVENOUS

## 2016-11-14 MED ORDER — MEPERIDINE (PF) 50 MG/ML INJ SOLN
50 mg/ml | INTRAMUSCULAR | Status: DC | PRN
Start: 2016-11-14 — End: 2016-11-13
  Administered 2016-11-14: 01:00:00 via INTRAVENOUS

## 2016-11-14 MED ORDER — MIDAZOLAM 1 MG/ML IJ SOLN
1 mg/mL | INTRAMUSCULAR | Status: DC | PRN
Start: 2016-11-14 — End: 2016-11-13
  Administered 2016-11-13: via INTRAVENOUS

## 2016-11-14 MED ORDER — LIDOCAINE (PF) 20 MG/ML (2 %) IJ SOLN
20 mg/mL (2 %) | INTRAMUSCULAR | Status: DC | PRN
Start: 2016-11-14 — End: 2016-11-13
  Administered 2016-11-14 (×2): via INTRAVENOUS

## 2016-11-14 MED ORDER — SODIUM CHLORIDE 0.9 % IJ SYRG
INTRAMUSCULAR | Status: DC | PRN
Start: 2016-11-14 — End: 2016-11-13

## 2016-11-14 MED ORDER — SUCCINYLCHOLINE CHLORIDE 20 MG/ML INJECTION
20 mg/mL | INTRAMUSCULAR | Status: DC | PRN
Start: 2016-11-14 — End: 2016-11-13
  Administered 2016-11-14: via INTRAVENOUS

## 2016-11-14 MED ORDER — LACTATED RINGERS IV
INTRAVENOUS | Status: DC
Start: 2016-11-14 — End: 2016-11-13

## 2016-11-14 MED ORDER — SODIUM CHLORIDE 0.9 % IV
INTRAVENOUS | Status: DC
Start: 2016-11-14 — End: 2016-11-13

## 2016-11-14 MED ORDER — IBUPROFEN 800 MG TAB
800 mg | ORAL_TABLET | Freq: Three times a day (TID) | ORAL | 0 refills | Status: AC | PRN
Start: 2016-11-14 — End: ?

## 2016-11-14 MED ORDER — PROPOFOL 10 MG/ML IV EMUL
10 mg/mL | INTRAVENOUS | Status: DC | PRN
Start: 2016-11-14 — End: 2016-11-13
  Administered 2016-11-14: via INTRAVENOUS

## 2016-11-14 MED ORDER — FENTANYL CITRATE (PF) 50 MCG/ML IJ SOLN
50 mcg/mL | INTRAMUSCULAR | Status: DC | PRN
Start: 2016-11-14 — End: 2016-11-13

## 2016-11-14 MED ORDER — LACTATED RINGERS IV
INTRAVENOUS | Status: DC | PRN
Start: 2016-11-14 — End: 2016-11-13
  Administered 2016-11-13 – 2016-11-14 (×2): via INTRAVENOUS

## 2016-11-14 MED ORDER — ONDANSETRON (PF) 4 MG/2 ML INJECTION
4 mg/2 mL | INTRAMUSCULAR | Status: DC | PRN
Start: 2016-11-14 — End: 2016-11-13
  Administered 2016-11-14: via INTRAVENOUS

## 2016-11-14 MED ORDER — GLYCOPYRROLATE 0.2 MG/ML IJ SOLN
0.2 mg/mL | INTRAMUSCULAR | Status: DC | PRN
Start: 2016-11-14 — End: 2016-11-13
  Administered 2016-11-14: 01:00:00 via INTRAVENOUS

## 2016-11-14 MED ORDER — ROCURONIUM 10 MG/ML IV
10 mg/mL | INTRAVENOUS | Status: DC | PRN
Start: 2016-11-14 — End: 2016-11-13
  Administered 2016-11-14: via INTRAVENOUS

## 2016-11-14 MED ORDER — OXYCODONE-ACETAMINOPHEN 5 MG-325 MG TAB
5-325 mg | ORAL_TABLET | ORAL | 0 refills | Status: AC | PRN
Start: 2016-11-14 — End: ?

## 2016-11-14 MED ORDER — ONDANSETRON (PF) 4 MG/2 ML INJECTION
4 mg/2 mL | INTRAMUSCULAR | Status: DC | PRN
Start: 2016-11-14 — End: 2016-11-13

## 2016-11-14 MED ORDER — MIDAZOLAM 1 MG/ML IJ SOLN
1 mg/mL | INTRAMUSCULAR | Status: DC | PRN
Start: 2016-11-14 — End: 2016-11-13

## 2016-11-14 MED ORDER — FENTANYL CITRATE (PF) 50 MCG/ML IJ SOLN
50 mcg/mL | INTRAMUSCULAR | Status: DC | PRN
Start: 2016-11-14 — End: 2016-11-13
  Administered 2016-11-14: via INTRAVENOUS

## 2016-11-14 MED ORDER — MORPHINE 10 MG/ML INJ SOLUTION
10 mg/ml | INTRAMUSCULAR | Status: DC | PRN
Start: 2016-11-14 — End: 2016-11-13

## 2016-11-14 MED ORDER — DEXAMETHASONE SODIUM PHOSPHATE 4 MG/ML IJ SOLN
4 mg/mL | INTRAMUSCULAR | Status: DC | PRN
Start: 2016-11-14 — End: 2016-11-13
  Administered 2016-11-14: via INTRAVENOUS

## 2016-11-14 MED FILL — FAMOTIDINE 20 MG TAB: 20 mg | ORAL | Qty: 1

## 2016-11-14 MED FILL — LACTATED RINGERS IV: INTRAVENOUS | Qty: 1000

## 2016-11-14 MED FILL — MEPERIDINE (PF) 50 MG/ML IJ SOLN: 50 mg/mL | INTRAMUSCULAR | Qty: 1

## 2016-11-14 MED FILL — SYNTHROID 50 MCG TABLET: 50 mcg | ORAL | Qty: 2

## 2016-11-14 MED FILL — KETOROLAC TROMETHAMINE 30 MG/ML INJECTION: 30 mg/mL (1 mL) | INTRAMUSCULAR | Qty: 1

## 2016-11-14 MED FILL — DOK 100 MG CAPSULE: 100 mg | ORAL | Qty: 1

## 2016-11-14 MED FILL — SODIUM CHLORIDE 0.9 % IV: INTRAVENOUS | Qty: 1000

## 2016-11-14 MED FILL — INSULIN REGULAR HUMAN 100 UNIT/ML INJECTION: 100 unit/mL | INTRAMUSCULAR | Qty: 1

## 2016-11-14 NOTE — Discharge Summary (Signed)
Gynecology Discharge Summary     Name: Katrina [name] MRN: [id number]  SSN: [id number]    Date of Birth: 12/30/1965  Age: 51 y.o.  Sex: female      Allergies: Aldactone [spironolactone]; Codeine; and Paxil [paroxetine hcl]    Admit Date: Nov 22, 2016    Discharge Date: 11/14/2016      Admitting Physician: Gerre Pebbles, MD     Discharge Physician: Gerre Pebbles, MD     * Admission Diagnoses: FIBROIDS/MENORRHAGIA  Menorrhagia    * Discharge Diagnoses:   Hospital Problems as of 11/14/2016 Date Reviewed: 11/22/2016          Codes Class Noted - Resolved POA    Menorrhagia ICD-10-CM: N92.0  ICD-9-CM: 626.2  11/22/16 - Present Unknown               * Procedures: TLH/BSO; return to OR for EUA and repair of vaginal laceration    Consults: None    * Discharge Condition: good    [facility] Course:   Hospital course was complicated by onset of heavy vaginal bleeding 4hrs postop.  The patient was returned to the OR for exam under anesthesia.  The cuff was intact and hemostatic but a bleeding vaginal sidewall laceration was noted and repaired.  The remainder of her course was unremarkable and she was ready for discharge on POD #1..    * Discharge Disposition: Home    Discharge Medications:   Current Discharge Medication List      START taking these medications    Details   oxyCODONE-acetaminophen (PERCOCET) 5-325 mg per tablet Take 1-2 Tabs by mouth every four (4) hours as needed. Max Daily Amount: 12 Tabs.  Qty: 30 Tab, Refills: 0    Associated Diagnoses: Postoperative pain      ibuprofen (MOTRIN) 800 mg tablet Take 1 Tab by mouth every eight (8) hours as needed for Pain.  Qty: 30 Tab, Refills: 0         CONTINUE these medications which have NOT CHANGED    Details   cinnamon bark (CINNAMON) 500 mg cap Take 1 Tab by mouth daily.      !! OTHER 1 Tab daily. BERBERINE 1, 000 MCG      SOY ISOFLA/BLK COHOSH/MAG BARK (ESTROVEN PO) Take 1 Tab by mouth daily.      EVENING PRIMROSE OIL (EVENING PRIMROSE PO) Take 1 Tab by mouth two (2)  times a day.      ASHWAGANDHA ROOT EXTRACT,BULK, 2 Tabs by Does Not Apply route daily.      !! OTHER nightly as needed.      cholecalciferol, VITAMIN D3, (VITAMIN D3) 5,000 unit tab tablet Take 5,000 Units by mouth daily.    Associated Diagnoses: Vitamin D deficiency      cyanocobalamin (VITAMIN B-12) 1,000 mcg tablet Take 1,000 mcg by mouth daily.    Associated Diagnoses: Essential hypertension      Omega-3 Fatty Acids (FISH OIL) 500 mg cap Take 1 Tab by mouth daily.    Associated Diagnoses: Dyslipidemia      aspirin delayed-release 81 mg tablet Take 1 Tab by mouth daily. Indications: prevention of transient ischemic attack  Qty: 90 Tab, Refills: 3    Associated Diagnoses: Type 2 diabetes, diet controlled (HCC)      levothyroxine (SYNTHROID) 100 mcg tablet Take 1 Tab by mouth Daily (before breakfast). Indications: hypothyroidism  Qty: 65 Tab, Refills: 1    Associated Diagnoses: Hypothyroidism due to acquired atrophy of thyroid  raNITIdine (ZANTAC) 150 mg tablet Take 150 mg by mouth two (2) times a day.      albuterol (PROVENTIL HFA, VENTOLIN HFA, PROAIR HFA) 90 mcg/actuation inhaler Take 2 Puffs by inhalation every four (4) hours as needed for Shortness of Breath. Indications: BRONCHOSPASM PREVENTION  Qty: 1 Inhaler, Refills: 5    Associated Diagnoses: Mild intermittent asthma without complication      b complex vitamins tablet Take 1 Tab by mouth daily.        ascorbic acid (VITAMIN C) 500 mg tablet Take 1,000 mg by mouth daily.      vitamin E (AQUA GEMS) 400 unit capsule Take 400 Units by mouth daily.      erythromycin (ILOTYCIN) ophthalmic ointment Administer  to right eye two (2) times a day. FOR THREE DAYS      metFORMIN ER (GLUCOPHAGE XR) 500 mg tablet TAKE 2 TABLETS BEFORE BREAKFAST AND DINNER FOR TYPE 2 DIABETES MELLITUS  Qty: 360 Tab, Refills: 3       !! - Potential duplicate medications found. Please discuss with provider.           * Follow-up Care/Patient Instructions:   Activity: No sex, douching, or tampons for 6 weeks or as directed by your physician. No heavy lifting for 6 weeks. No driving while taking pain medication.  Diet: Resume pre-hospital diet  Wound Care: Keep wound clean and dry    Follow-up Information     Follow up With Specialties Details Why Contact Info    Electa Sniff, MD [facility]   [place] [address]  [phone]            Signed By:  Gerre Pebbles, MD     November 14, 2016

## 2016-11-14 NOTE — Progress Notes (Signed)
Spiritual Care Partner Volunteer visited patient in Rm [id number] on 11/14/2016.  Documented by:  Marda Stalker, Chaplain, MDiv, MS, Icard  [phone] [phone])

## 2016-11-14 NOTE — Progress Notes (Addendum)
Gynecology Progress Note    Patient doing well post-op day 1 from Procedure(s):  TLH/BSO followed by return to OR for EUA WITH REPAIR OF VAGINAL LACERATION without significant complaints.  Minimal pain, no nausea.  Ate regular breakfast. Foley out and has voided without difficulty. Patient is passing flatus.  No VB overnight with exception of light spotting.    Vitals:  Blood pressure 110/66, pulse 70, temperature 99 ??F (37.2 ??C), resp. rate 14, height 5' 5.5" (1.664 m), weight 88.6 kg (195 lb 4 oz), last menstrual period 11/01/2016, SpO2 99 %.  Temp (24hrs), Avg:98.4 ??F (36.9 ??C), Min:97.6 ??F (36.4 ??C), Max:99 ??F (37.2 ??C)        Exam:  Patient without distress.               Abdomen soft,  nontender.                 Incision dry and clean without erythema.               Lower extremities are negative for swelling, cords, or tenderness.    Lab/Data Review:  CBC:   Lab Results   Component Value Date/Time    WBC 13.1 (H) 11/14/2016 04:32 AM    HGB 10.1 (L) 11/14/2016 04:32 AM    HCT 31.9 (L) 11/14/2016 04:32 AM    PLT 354 11/14/2016 04:32 AM       Assessment and Plan:  Patient appears to be having uncomplicated post Procedure(s):  TLH/BSO course.  Continue routine post-op care. Anticipate d/c home later today.  Instructions reviewed, questions answered.  Reviewed in detail events of last night

## 2016-11-14 NOTE — Progress Notes (Signed)
I have reviewed discharge instructions with the patient.  The patient verbalized understanding.

## 2016-11-14 NOTE — Op Note (Signed)
[facility]  OPERATIVE REPORT    GRAYLEE, ARUTYUNYAN.  MR#: [id number]  DOB: 05-Nov-1965  ACCOUNT #: [id number]   DATE OF SERVICE: 11/13/2016    PREOPERATIVE DIAGNOSIS:  Vaginal bleeding, status post total laparoscopic hysterectomy.    POSTOPERATIVE DIAGNOSIS:  Vaginal bleeding, status post total laparoscopic hysterectomy plus vaginal laceration.    PROCEDURE PERFORMED:  Exam under anesthesia and repair of vaginal laceration.    SURGEON:  Lissa Morales, MD    ASSISTANT:  none    ANESTHESIA:  General endotracheal.    ESTIMATED BLOOD LOSS:  50 mL.    SPECIMENS REMOVED:  None.    FINDINGS:  There was a right vaginal sidewall laceration which was bleeding.  The cuff was intact.    COMPLICATIONS:  None.    IMPLANTS:  none    INDICATIONS:  The patient is a 51 year old G0 who underwent a total laparoscopic hysterectomy earlier in the day.  About 4 hours postop, she began having some heavy vaginal bleeding and she was taken to the operating room for exam under anesthesia and necessary repair.    DESCRIPTION OF PROCEDURE:  After informed consent was obtained, the patient was taken to the operating suite and underwent general anesthesia.  Her feet were placed in [place] and she was prepped and draped in the usual sterile fashion.  A Foley catheter was already in place.  A weighted speculum was placed in the patient's vagina and a right angle retractor was used for visualization.  There was a large amount of clot in the vagina which was removed and it was irrigated.  A right vaginal sidewall laceration was observed which was obviously bleeding.  This was repaired with a running locking 2-0 Vicryl.  Good hemostasis was noted there.  The vaginal cuff was inspected and was noted to be hemostatic.  There was a slightly denuded area on the edges in the midline which was not bleeding but this was just sutured for reassurance with 1 stitch of  2-0 Vicryl.  There was actually a small left vaginal sidewall laceration just at the level of the hymenal ring and 1 in the midline at around 6 o'clock, which were created by the vaginal retractor a the time of this subsequent procedure and these were repaired with just 1 figure-of-eight stitch each of 3-0 Vicryl.  Good hemostasis was then noted.  The vagina was well irrigated and observed and it was noted to be completely dry without any bleeding.  The patient was awakened and taken to recovery in stable condition.  There were no immediate complications.      Lissa Morales, MD       [facility] / [place]  D: 11/13/2016 20:20     T: 11/14/2016 08:37  JOB #: [id number]

## 2016-11-14 NOTE — Progress Notes (Addendum)
Bedside and Verbal shift change report given to A. Kirton RN (oncoming nurse) by P. Clay RN (offgoing nurse). Report included the following information SBAR, Kardex, Intake/Output, MAR and Recent Results.

## 2016-11-14 NOTE — Discharge Summary (Signed)
Gynecology Discharge Summary     Name: Katrina [name] MRN: [id number]  SSN: [id number]    Date of Birth: [birth year]  Age: 51 y.o.  Sex: female      Allergies: Aldactone [spironolactone]; Codeine; and Paxil [paroxetine hcl]    Admit Date: 12/02/2016    Discharge Date: 11/14/2016      Admitting Physician: Gerre Pebbles, MD     Discharge Physician: Gerre Pebbles, MD     * Admission Diagnoses: FIBROIDS/MENORRHAGIA  Menorrhagia    * Discharge Diagnoses:   Hospital Problems as of 11/14/2016 Date Reviewed: 02-Dec-2016          Codes Class Noted - Resolved POA    Menorrhagia ICD-10-CM: N92.0  ICD-9-CM: 626.2  2016-12-02 - Present Unknown               * Procedures: TLH/BSO; return to OR for EUA and repair of vaginal laceration    Consults: None    * Discharge Condition: good    [facility] Course:   Hospital course was complicated by onset of heavy vaginal bleeding 4hrs postop.  The patient was returned to the OR for exam under anesthesia.  The cuff was intact and hemostatic but a bleeding vaginal sidewall laceration was noted and repaired.  The remainder of her course was unremarkable and she was ready for discharge on POD #1..    * Discharge Disposition: Home    Discharge Medications:   Current Discharge Medication List      START taking these medications    Details   oxyCODONE-acetaminophen (PERCOCET) 5-325 mg per tablet Take 1-2 Tabs by mouth every four (4) hours as needed. Max Daily Amount: 12 Tabs.  Qty: 30 Tab, Refills: 0    Associated Diagnoses: Postoperative pain      ibuprofen (MOTRIN) 800 mg tablet Take 1 Tab by mouth every eight (8) hours as needed for Pain.  Qty: 30 Tab, Refills: 0         CONTINUE these medications which have NOT CHANGED    Details   cinnamon bark (CINNAMON) 500 mg cap Take 1 Tab by mouth daily.      !! OTHER 1 Tab daily. BERBERINE 1, 000 MCG      SOY ISOFLA/BLK COHOSH/MAG BARK (ESTROVEN PO) Take 1 Tab by mouth daily.      EVENING PRIMROSE OIL (EVENING PRIMROSE PO) Take 1 Tab by mouth two (2) times a  day.      ASHWAGANDHA ROOT EXTRACT,BULK, 2 Tabs by Does Not Apply route daily.      !! OTHER nightly as needed.      cholecalciferol, VITAMIN D3, (VITAMIN D3) 5,000 unit tab tablet Take 5,000 Units by mouth daily.    Associated Diagnoses: Vitamin D deficiency      cyanocobalamin (VITAMIN B-12) 1,000 mcg tablet Take 1,000 mcg by mouth daily.    Associated Diagnoses: Essential hypertension      Omega-3 Fatty Acids (FISH OIL) 500 mg cap Take 1 Tab by mouth daily.    Associated Diagnoses: Dyslipidemia      aspirin delayed-release 81 mg tablet Take 1 Tab by mouth daily. Indications: prevention of transient ischemic attack  Qty: 90 Tab, Refills: 3    Associated Diagnoses: Type 2 diabetes, diet controlled (HCC)      levothyroxine (SYNTHROID) 100 mcg tablet Take 1 Tab by mouth Daily (before breakfast). Indications: hypothyroidism  Qty: 59 Tab, Refills: 1    Associated Diagnoses: Hypothyroidism due to acquired atrophy of thyroid  raNITIdine (ZANTAC) 150 mg tablet Take 150 mg by mouth two (2) times a day.      albuterol (PROVENTIL HFA, VENTOLIN HFA, PROAIR HFA) 90 mcg/actuation inhaler Take 2 Puffs by inhalation every four (4) hours as needed for Shortness of Breath. Indications: BRONCHOSPASM PREVENTION  Qty: 1 Inhaler, Refills: 5    Associated Diagnoses: Mild intermittent asthma without complication      b complex vitamins tablet Take 1 Tab by mouth daily.        ascorbic acid (VITAMIN C) 500 mg tablet Take 1,000 mg by mouth daily.      vitamin E (AQUA GEMS) 400 unit capsule Take 400 Units by mouth daily.      erythromycin (ILOTYCIN) ophthalmic ointment Administer  to right eye two (2) times a day. FOR THREE DAYS      metFORMIN ER (GLUCOPHAGE XR) 500 mg tablet TAKE 2 TABLETS BEFORE BREAKFAST AND DINNER FOR TYPE 2 DIABETES MELLITUS  Qty: 360 Tab, Refills: 3       !! - Potential duplicate medications found. Please discuss with provider.           * Follow-up Care/Patient Instructions:  Activity: No sex, douching, or  tampons for 6 weeks or as directed by your physician. No heavy lifting for 6 weeks. No driving while taking pain medication.  Diet: Resume pre-hospital diet  Wound Care: Keep wound clean and dry    Follow-up Information     Follow up With Specialties Details Why Contact Info    Electa Sniff, MD [facility]   [place] [address]  [phone]            Signed By:  Gerre Pebbles, MD     November 14, 2016

## 2016-11-29 ENCOUNTER — Inpatient Hospital Stay
Admit: 2016-11-29 | Discharge: 2016-12-02 | Disposition: A | Discharge status: Home / Self Care | Payer: BLUE CROSS/BLUE SHIELD | Source: Other Acute Inpatient Hospital | Attending: Obstetrics & Gynecology | Admitting: Obstetrics & Gynecology

## 2016-11-29 ENCOUNTER — Emergency Department: Admit: 2016-11-29 | Payer: BLUE CROSS/BLUE SHIELD | Primary: Family Medicine

## 2016-11-29 DIAGNOSIS — N99840 Postprocedural hematoma of a genitourinary system organ or structure following a genitourinary system procedure: Secondary | ICD-10-CM

## 2016-11-29 LAB — CBC WITH AUTOMATED DIFF
ABS. BASOPHILS: 0.1 10*3/uL (ref 0.0–0.1)
ABS. EOSINOPHILS: 0.8 10*3/uL — ABNORMAL HIGH (ref 0.0–0.4)
ABS. IMM. GRANS.: 0.2 10*3/uL — ABNORMAL HIGH (ref 0.00–0.04)
ABS. LYMPHOCYTES: 3.2 10*3/uL (ref 0.8–3.5)
ABS. MONOCYTES: 0.5 10*3/uL (ref 0.0–1.0)
ABS. NEUTROPHILS: 5.9 10*3/uL (ref 1.8–8.0)
ABSOLUTE NRBC: 0 10*3/uL (ref 0.00–0.01)
BASOPHILS: 1 % (ref 0–1)
EOSINOPHILS: 8 % — ABNORMAL HIGH (ref 0–7)
HCT: 33.8 % — ABNORMAL LOW (ref 35.0–47.0)
HGB: 10.6 g/dL — ABNORMAL LOW (ref 11.5–16.0)
IMMATURE GRANULOCYTES: 2 % — ABNORMAL HIGH (ref 0.0–0.5)
LYMPHOCYTES: 30 % (ref 12–49)
MCH: 27.3 PG (ref 26.0–34.0)
MCHC: 31.4 g/dL (ref 30.0–36.5)
MCV: 87.1 FL (ref 80.0–99.0)
MONOCYTES: 5 % (ref 5–13)
MPV: 10.6 FL (ref 8.9–12.9)
NEUTROPHILS: 55 % (ref 32–75)
NRBC: 0 PER 100 WBC
PLATELET: 550 10*3/uL — ABNORMAL HIGH (ref 150–400)
RBC: 3.88 M/uL (ref 3.80–5.20)
RDW: 13.4 % (ref 11.5–14.5)
WBC: 10.6 10*3/uL (ref 3.6–11.0)

## 2016-11-29 LAB — METABOLIC PANEL, COMPREHENSIVE
A-G Ratio: 0.9 — ABNORMAL LOW (ref 1.1–2.2)
ALT (SGPT): 25 U/L (ref 12–78)
AST (SGOT): 12 U/L — ABNORMAL LOW (ref 15–37)
Albumin: 3.6 g/dL (ref 3.5–5.0)
Alk. phosphatase: 88 U/L (ref 45–117)
Anion gap: 10 mmol/L (ref 5–15)
BUN/Creatinine ratio: 23 — ABNORMAL HIGH (ref 12–20)
BUN: 17 MG/DL (ref 6–20)
Bilirubin, total: 0.2 MG/DL (ref 0.2–1.0)
CO2: 21 mmol/L (ref 21–32)
Calcium: 9.6 MG/DL (ref 8.5–10.1)
Chloride: 110 mmol/L — ABNORMAL HIGH (ref 97–108)
Creatinine: 0.75 MG/DL (ref 0.55–1.02)
GFR est AA: >60 mL/min/{1.73_m2} (ref >60)
GFR est non-AA: >60 mL/min/{1.73_m2} (ref >60)
Globulin: 4.2 g/dL — ABNORMAL HIGH (ref 2.0–4.0)
Glucose: 133 mg/dL — ABNORMAL HIGH (ref 65–100)
Potassium: 4 mmol/L (ref 3.5–5.1)
Protein, total: 7.8 g/dL (ref 6.4–8.2)
Sodium: 141 mmol/L (ref 136–145)

## 2016-11-29 LAB — PTT: aPTT: 25.6 s (ref 22.1–32.0)

## 2016-11-29 LAB — PROTHROMBIN TIME + INR
INR: 1 (ref 0.9–1.1)
Prothrombin time: 9.8 s (ref 9.0–11.1)

## 2016-11-29 LAB — LACTIC ACID: Lactic acid: 1.8 MMOL/L (ref 0.4–2.0)

## 2016-11-29 LAB — FIBRINOGEN: Fibrinogen: 400 mg/dL (ref 200–475)

## 2016-11-29 MED ORDER — CLINDAMYCIN IN D5W 900 MG/50 ML IV PIGGY BACK
900 mg/50 mL | INTRAVENOUS | Status: DC | PRN
Start: 2016-11-29 — End: 2016-11-29
  Administered 2016-11-29: 19:00:00 via INTRAVENOUS

## 2016-11-29 MED ORDER — GENTAMICIN 40 MG/ML IJ SOLN
40 mg/mL | INTRAMUSCULAR | Status: DC
Start: 2016-11-29 — End: 2016-11-29

## 2016-11-29 MED ORDER — LACTATED RINGERS IV
INTRAVENOUS | Status: DC
Start: 2016-11-29 — End: 2016-12-01
  Administered 2016-11-29 – 2016-12-01 (×5): via INTRAVENOUS

## 2016-11-29 MED ORDER — SODIUM CHLORIDE 0.9 % IV
INTRAVENOUS | Status: DC | PRN
Start: 2016-11-29 — End: 2016-11-29

## 2016-11-29 MED ORDER — SODIUM CHLORIDE 0.9 % IJ SYRG
Freq: Three times a day (TID) | INTRAMUSCULAR | Status: DC
Start: 2016-11-29 — End: 2016-11-29

## 2016-11-29 MED ORDER — SODIUM CHLORIDE 0.9 % IJ SYRG
INTRAMUSCULAR | Status: DC | PRN
Start: 2016-11-29 — End: 2016-11-29
  Administered 2016-11-29: 22:00:00 via INTRAVENOUS

## 2016-11-29 MED ORDER — FENTANYL CITRATE (PF) 50 MCG/ML IJ SOLN
50 mcg/mL | INTRAMUSCULAR | Status: AC
Start: 2016-11-29 — End: ?

## 2016-11-29 MED ORDER — NALOXONE 0.4 MG/ML INJECTION
0.4 mg/mL | INTRAMUSCULAR | Status: DC | PRN
Start: 2016-11-29 — End: 2016-12-02

## 2016-11-29 MED ORDER — SODIUM CHLORIDE 0.9 % IV
1 gram | Freq: Four times a day (QID) | INTRAVENOUS | Status: DC
Start: 2016-11-29 — End: 2016-11-29

## 2016-11-29 MED ORDER — FENTANYL CITRATE (PF) 50 MCG/ML IJ SOLN
50 mcg/mL | INTRAMUSCULAR | Status: DC | PRN
Start: 2016-11-29 — End: 2016-11-29
  Administered 2016-11-29: 18:00:00 via INTRAVENOUS

## 2016-11-29 MED ORDER — FENTANYL CITRATE (PF) 50 MCG/ML IJ SOLN
50 mcg/mL | INTRAMUSCULAR | Status: AC | PRN
Start: 2016-11-29 — End: 2016-11-29
  Administered 2016-11-29 (×4): via INTRAVENOUS

## 2016-11-29 MED ORDER — SODIUM CHLORIDE 0.9 % IV
40 mg/mL | Freq: Once | INTRAVENOUS | Status: DC
Start: 2016-11-29 — End: 2016-11-29

## 2016-11-29 MED ORDER — HYDROMORPHONE 2 MG/ML INJECTION SOLUTION
2 mg/mL | INTRAMUSCULAR | Status: AC | PRN
Start: 2016-11-29 — End: 2016-12-01
  Administered 2016-11-30 (×2): via INTRAVENOUS

## 2016-11-29 MED ORDER — DEXAMETHASONE SODIUM PHOSPHATE 4 MG/ML IJ SOLN
4 mg/mL | INTRAMUSCULAR | Status: DC | PRN
Start: 2016-11-29 — End: 2016-11-29
  Administered 2016-11-29: 18:00:00 via INTRAVENOUS

## 2016-11-29 MED ORDER — SODIUM CHLORIDE 0.9 % IV
INTRAVENOUS | Status: DC | PRN
Start: 2016-11-29 — End: 2016-12-02

## 2016-11-29 MED ORDER — CLINDAMYCIN IN D5W 900 MG/50 ML IV PIGGY BACK
900 mg/50 mL | Freq: Three times a day (TID) | INTRAVENOUS | Status: DC
Start: 2016-11-29 — End: 2016-11-29

## 2016-11-29 MED ORDER — GENTAMICIN 40 MG/ML IJ SOLN
40 mg/mL | Freq: Once | INTRAMUSCULAR | Status: DC
Start: 2016-11-29 — End: 2016-11-29

## 2016-11-29 MED ORDER — OXYCODONE-ACETAMINOPHEN 5 MG-325 MG TAB
5-325 mg | ORAL | Status: DC | PRN
Start: 2016-11-29 — End: 2016-11-30
  Administered 2016-11-30 (×2): via ORAL

## 2016-11-29 MED ORDER — ONDANSETRON (PF) 4 MG/2 ML INJECTION
4 mg/2 mL | INTRAMUSCULAR | Status: DC | PRN
Start: 2016-11-29 — End: 2016-11-29
  Administered 2016-11-29: 21:00:00 via INTRAVENOUS

## 2016-11-29 MED ORDER — ROCURONIUM 10 MG/ML IV
10 mg/mL | INTRAVENOUS | Status: DC | PRN
Start: 2016-11-29 — End: 2016-11-29
  Administered 2016-11-29 (×6): via INTRAVENOUS

## 2016-11-29 MED ORDER — GENTAMICIN 40 MG/ML IJ SOLN
40 mg/mL | Freq: Once | INTRAMUSCULAR | Status: AC
Start: 2016-11-29 — End: 2016-11-29
  Administered 2016-11-29: 19:00:00 via INTRAVENOUS

## 2016-11-29 MED ORDER — ONDANSETRON (PF) 4 MG/2 ML INJECTION
4 mg/2 mL | INTRAMUSCULAR | Status: DC | PRN
Start: 2016-11-29 — End: 2016-11-29

## 2016-11-29 MED ORDER — CLINDAMYCIN IN D5W 900 MG/50 ML IV PIGGY BACK
900 mg/50 mL | INTRAVENOUS | Status: AC
Start: 2016-11-29 — End: ?

## 2016-11-29 MED ORDER — FENTANYL CITRATE (PF) 50 MCG/ML IJ SOLN
50 mcg/mL | INTRAMUSCULAR | Status: DC | PRN
Start: 2016-11-29 — End: 2016-11-29

## 2016-11-29 MED ORDER — PROPOFOL 10 MG/ML IV EMUL
10 mg/mL | INTRAVENOUS | Status: DC | PRN
Start: 2016-11-29 — End: 2016-11-29
  Administered 2016-11-29: 18:00:00 via INTRAVENOUS

## 2016-11-29 MED ORDER — MIDAZOLAM 1 MG/ML IJ SOLN
1 mg/mL | INTRAMUSCULAR | Status: DC | PRN
Start: 2016-11-29 — End: 2016-11-29

## 2016-11-29 MED ORDER — SODIUM CHLORIDE 0.9 % IV
INTRAVENOUS | Status: DC | PRN
Start: 2016-11-29 — End: 2016-11-29
  Administered 2016-11-29 (×3): via INTRAVENOUS

## 2016-11-29 MED ORDER — SODIUM CHLORIDE 0.9 % IJ SYRG
INTRAMUSCULAR | Status: DC | PRN
Start: 2016-11-29 — End: 2016-11-29

## 2016-11-29 MED ORDER — MIDAZOLAM 1 MG/ML IJ SOLN
1 mg/mL | INTRAMUSCULAR | Status: AC
Start: 2016-11-29 — End: ?

## 2016-11-29 MED ORDER — SODIUM CHLORIDE 0.9 % IV
INTRAVENOUS | Status: DC
Start: 2016-11-29 — End: 2016-11-29

## 2016-11-29 MED ORDER — LACTATED RINGERS IV
INTRAVENOUS | Status: DC
Start: 2016-11-29 — End: 2016-11-29
  Administered 2016-11-29: 22:00:00 via INTRAVENOUS

## 2016-11-29 MED ORDER — ENOXAPARIN 40 MG/0.4 ML SUB-Q SYRINGE
40 mg/0.4 mL | SUBCUTANEOUS | Status: DC
Start: 2016-11-29 — End: 2016-11-30

## 2016-11-29 MED ORDER — HYDROMORPHONE (PF) 2 MG/ML IJ SOLN
2 mg/mL | INTRAMUSCULAR | Status: AC
Start: 2016-11-29 — End: ?

## 2016-11-29 MED ORDER — ALBUMIN, HUMAN 5 % IV
5 % | INTRAVENOUS | Status: DC | PRN
Start: 2016-11-29 — End: 2016-11-29
  Administered 2016-11-29: 20:00:00 via INTRAVENOUS

## 2016-11-29 MED ORDER — MORPHINE 10 MG/ML INJ SOLUTION
10 mg/ml | INTRAMUSCULAR | Status: DC | PRN
Start: 2016-11-29 — End: 2016-11-29
  Administered 2016-11-29 (×2): via INTRAVENOUS

## 2016-11-29 MED ORDER — ROPIVACAINE (PF) 5 MG/ML (0.5 %) INJECTION
5 mg/mL (0. %) | INTRAMUSCULAR | Status: DC | PRN
Start: 2016-11-29 — End: 2016-11-29

## 2016-11-29 MED ORDER — SODIUM CHLORIDE 0.9 % IJ SYRG
INTRAMUSCULAR | Status: DC | PRN
Start: 2016-11-29 — End: 2016-12-02
  Administered 2016-12-02: 03:00:00 via INTRAVENOUS

## 2016-11-29 MED ORDER — LACTATED RINGERS IV
INTRAVENOUS | Status: DC
Start: 2016-11-29 — End: 2016-11-29

## 2016-11-29 MED ORDER — LIDOCAINE (PF) 20 MG/ML (2 %) IJ SOLN
20 mg/mL (2 %) | INTRAMUSCULAR | Status: DC | PRN
Start: 2016-11-29 — End: 2016-11-29
  Administered 2016-11-29: 18:00:00 via INTRAVENOUS

## 2016-11-29 MED ORDER — SUGAMMADEX 100 MG/ML INTRAVENOUS SOLUTION
100 mg/mL | INTRAVENOUS | Status: AC
Start: 2016-11-29 — End: 2016-11-29
  Administered 2016-11-29: 21:00:00 via INTRAVENOUS

## 2016-11-29 MED ORDER — LACTATED RINGERS IV
INTRAVENOUS | Status: DC | PRN
Start: 2016-11-29 — End: 2016-11-29
  Administered 2016-11-29 (×2): via INTRAVENOUS

## 2016-11-29 MED ORDER — ONDANSETRON (PF) 4 MG/2 ML INJECTION
4 mg/2 mL | INTRAMUSCULAR | Status: DC | PRN
Start: 2016-11-29 — End: 2016-12-02
  Administered 2016-12-01: 11:00:00 via INTRAVENOUS

## 2016-11-29 MED ORDER — FAMOTIDINE 20 MG TAB
20 mg | Freq: Two times a day (BID) | ORAL | Status: DC
Start: 2016-11-29 — End: 2016-12-02
  Administered 2016-11-30 – 2016-12-02 (×6): via ORAL

## 2016-11-29 MED ORDER — DEXMEDETOMIDINE 400 MCG/100 ML (4 MCG/ML) IN 0.9 % SODIUM CHLORIDE IV
400 mcg/100 mL (4 mcg/mL) | INTRAVENOUS | Status: DC | PRN
Start: 2016-11-29 — End: 2016-11-29
  Administered 2016-11-29 (×3): via INTRAVENOUS

## 2016-11-29 MED ORDER — SODIUM CHLORIDE 0.9 % IJ SYRG
Freq: Once | INTRAMUSCULAR | Status: AC
Start: 2016-11-29 — End: 2016-11-29
  Administered 2016-11-29: 17:00:00 via INTRAVENOUS

## 2016-11-29 MED ORDER — OXYCODONE-ACETAMINOPHEN 5 MG-325 MG TAB
5-325 mg | ORAL | Status: DC | PRN
Start: 2016-11-29 — End: 2016-12-02
  Administered 2016-11-29 – 2016-12-02 (×10): via ORAL

## 2016-11-29 MED ORDER — GENTAMICIN 40 MG/ML IJ SOLN
40 mg/mL | INTRAMUSCULAR | Status: AC
Start: 2016-11-29 — End: ?

## 2016-11-29 MED ORDER — CEFAZOLIN 2 GRAM/20 ML IN STERILE WATER INTRAVENOUS SYRINGE
2 gram/0 mL | Freq: Once | INTRAVENOUS | Status: AC
Start: 2016-11-29 — End: 2016-11-29
  Administered 2016-11-29: 17:00:00 via INTRAVENOUS

## 2016-11-29 MED ORDER — IBUPROFEN 400 MG TAB
400 mg | Freq: Three times a day (TID) | ORAL | Status: DC
Start: 2016-11-29 — End: 2016-12-02
  Administered 2016-11-30 – 2016-12-02 (×8): via ORAL

## 2016-11-29 MED ORDER — DOCUSATE SODIUM 100 MG CAP
100 mg | Freq: Two times a day (BID) | ORAL | Status: DC
Start: 2016-11-29 — End: 2016-12-02
  Administered 2016-11-30 – 2016-12-02 (×6): via ORAL

## 2016-11-29 MED ORDER — LEVOTHYROXINE 100 MCG TAB
100 mcg | ORAL | Status: DC
Start: 2016-11-29 — End: 2016-12-02
  Administered 2016-11-30 – 2016-12-02 (×3): via ORAL

## 2016-11-29 MED ORDER — OXYCODONE 5 MG TAB
5 mg | ORAL | Status: DC | PRN
Start: 2016-11-29 — End: 2016-11-29

## 2016-11-29 MED ORDER — DIPHENHYDRAMINE HCL 50 MG/ML IJ SOLN
50 mg/mL | INTRAMUSCULAR | Status: DC | PRN
Start: 2016-11-29 — End: 2016-12-02

## 2016-11-29 MED ORDER — SODIUM CHLORIDE 0.9% BOLUS IV
0.9 % | Freq: Once | INTRAVENOUS | Status: AC
Start: 2016-11-29 — End: 2016-11-29
  Administered 2016-11-29: 17:00:00 via INTRAVENOUS

## 2016-11-29 MED ORDER — SODIUM CHLORIDE 0.9 % IV
INTRAVENOUS | Status: AC | PRN
Start: 2016-11-29 — End: 2016-11-29
  Administered 2016-11-29: 21:00:00

## 2016-11-29 MED ORDER — IOPAMIDOL 76 % IV SOLN
370 mg iodine /mL (76 %) | Freq: Once | INTRAVENOUS | Status: AC
Start: 2016-11-29 — End: 2016-11-29
  Administered 2016-11-29: 17:00:00 via INTRAVENOUS

## 2016-11-29 MED ORDER — HYDROMORPHONE 2 MG/ML INJECTION SOLUTION
2 mg/mL | Freq: Once | INTRAMUSCULAR | Status: AC
Start: 2016-11-29 — End: 2016-11-29
  Administered 2016-11-29: 16:00:00 via INTRAVENOUS

## 2016-11-29 MED ORDER — SODIUM CHLORIDE 0.9 % IV
INTRAVENOUS | Status: DC
Start: 2016-11-29 — End: 2016-11-29
  Administered 2016-11-29: 22:00:00 via INTRAVENOUS

## 2016-11-29 MED ORDER — DEXMEDETOMIDINE 100 MCG/ML IV SOLN
100 mcg/mL | INTRAVENOUS | Status: AC
Start: 2016-11-29 — End: ?

## 2016-11-29 MED ORDER — LIDOCAINE (PF) 10 MG/ML (1 %) IJ SOLN
10 mg/mL (1 %) | INTRAMUSCULAR | Status: DC | PRN
Start: 2016-11-29 — End: 2016-11-29

## 2016-11-29 MED ORDER — HYDROMORPHONE (PF) 2 MG/ML IJ SOLN
2 mg/mL | INTRAMUSCULAR | Status: DC | PRN
Start: 2016-11-29 — End: 2016-11-29
  Administered 2016-11-29 (×3): via INTRAVENOUS

## 2016-11-29 MED ORDER — SODIUM CHLORIDE 0.9 % IJ SYRG
Freq: Three times a day (TID) | INTRAMUSCULAR | Status: DC
Start: 2016-11-29 — End: 2016-12-02
  Administered 2016-11-30 – 2016-12-02 (×7): via INTRAVENOUS

## 2016-11-29 MED ORDER — MIDAZOLAM 1 MG/ML IJ SOLN
1 mg/mL | INTRAMUSCULAR | Status: DC | PRN
Start: 2016-11-29 — End: 2016-11-29
  Administered 2016-11-29: 18:00:00 via INTRAVENOUS

## 2016-11-29 MED FILL — PRECEDEX 100 MCG/ML INTRAVENOUS SOLUTION: 100 mcg/mL | INTRAVENOUS | Qty: 2

## 2016-11-29 MED FILL — HYDROMORPHONE (PF) 2 MG/ML IJ SOLN: 2 mg/mL | INTRAMUSCULAR | Qty: 1

## 2016-11-29 MED FILL — CLINDAMYCIN IN D5W 900 MG/50 ML IV PIGGY BACK: 900 mg/50 mL | INTRAVENOUS | Qty: 50

## 2016-11-29 MED FILL — LACTATED RINGERS IV: INTRAVENOUS | Qty: 1000

## 2016-11-29 MED FILL — MORPHINE 10 MG/ML IJ SOLN: 10 mg/mL | INTRAMUSCULAR | Qty: 1

## 2016-11-29 MED FILL — SODIUM CHLORIDE 0.9 % IV: INTRAVENOUS | Qty: 100

## 2016-11-29 MED FILL — SODIUM CHLORIDE 0.9 % IV: INTRAVENOUS | Qty: 250

## 2016-11-29 MED FILL — MIDAZOLAM 1 MG/ML IJ SOLN: 1 mg/mL | INTRAMUSCULAR | Qty: 2

## 2016-11-29 MED FILL — CEFAZOLIN 2 GRAM/20 ML IN STERILE WATER INTRAVENOUS SYRINGE: 2 gram/0 mL | INTRAVENOUS | Qty: 20

## 2016-11-29 MED FILL — GENTAMICIN 40 MG/ML IJ SOLN: 40 mg/mL | INTRAMUSCULAR | Qty: 8

## 2016-11-29 MED FILL — OXYCODONE-ACETAMINOPHEN 5 MG-325 MG TAB: 5-325 mg | ORAL | Qty: 2

## 2016-11-29 MED FILL — FENTANYL CITRATE (PF) 50 MCG/ML IJ SOLN: 50 mcg/mL | INTRAMUSCULAR | Qty: 2

## 2016-11-29 MED FILL — BRIDION 100 MG/ML INTRAVENOUS SOLUTION: 100 mg/mL | INTRAVENOUS | Qty: 1.77

## 2016-11-29 MED FILL — NORMAL SALINE FLUSH 0.9 % INJECTION SYRINGE: INTRAMUSCULAR | Qty: 10

## 2016-11-29 MED FILL — ISOVUE-370  76 % INTRAVENOUS SOLUTION: 370 mg iodine /mL (76 %) | INTRAVENOUS | Qty: 100

## 2016-11-29 MED FILL — SYNTHROID 100 MCG TABLET: 100 mcg | ORAL | Qty: 1

## 2016-11-29 MED FILL — HYDROMORPHONE 2 MG/ML INJECTION SOLUTION: 2 mg/mL | INTRAMUSCULAR | Qty: 1

## 2016-11-29 MED FILL — SODIUM CHLORIDE 0.9 % IV: INTRAVENOUS | Qty: 1000

## 2016-11-29 MED FILL — LACTATED RINGERS IV: INTRAVENOUS | Qty: 500

## 2016-11-29 NOTE — Op Note (Signed)
Gynecology operative note    Preop diagnosis: Vaginal bleeding    Post operative diagnosis: Vaginal bleeding    Procedure:   1. Pelvic exam under anesthesia  2. Exploratory laparotomy  3. Air proctoscopy (by Dr. Drue Flirt)  4. Lysis of omental adhesion (by Dr. Drue Flirt)    Surgeon: Dr. Anders Simmonds  Co surgeon/Consultant: Dr. Drue Flirt, gyn oncologist  Assistant: Dr. Maylon Cos    Anesthesia: GETA    Antibiotics: 2gm ancef, 5mg /kg gentamicin, 900mg  clindamycin    EBL: 127mL    UOP: 237mL clear    IVF: 2L crystalloid, 225mL albumin    Drains: Foley to gravity    Specimens: None    Complications: None    Findings:   1. Vaginal vault filled with blood clot  2. Superficial bleeding from medial portion of vaginal cuff  3. Vaginal cuff intact  4. No air leak with air proctoscopy  5. 5cm well-organized blood clot above the vaginal cuff, small hemoperitoneum (~167mL)  6. No active bleeding within the peritoneal cavity  7. Inflamed rectosigmoid  8. Normal appearance of entire length of small bowel  9. Omental adhesion to umbilical region    Dispo: Stable to PACU    Indications for procedure: Katrina [name] is a 51 y/o who is POD 16 s/p TLH BSO for symptomatic uterine fibroids. She presented this morning with profuse vaginal bleeding.  CT scan was concerning for active bleeding into the vagina, for an ~6cm collection above the vaginal cuff, and for an inflamed loop of bowel in close proximity to the cuff.  The planned procedure was discussed with the patient preoperatively as were the risks inherent to the procedure.  All questions were answered and informed consent was obtained and signed.  The patient received 2u PRBC prior to proceeding to the operating room.     Procedure: After informed consent was obtained and signed, the patient was taken to the operating room where she was placed in the dorsal supine position.  General anesthesia with obtained and endotracheal intubation was performed without difficulty.  She was then  repositioned into the dorsal lithotomy position with legs in [place].  Care was taken to avoid any underlying nerve damage.  The patient was then prepped and draped in the usual sterile fashion for vaginal and abdominal surgery.  A surgical time out was performed wherein the patient's identity, procedure to be performed, and any surgical risks were verified.  After surgical timeout was completed, the procedure was begun.  A foley catheter was placed in the bladder.  A pelvic exam revealed copious amount of well organized clot in the vagina.  After this clot was manually evacuated, long weighted speculum was placed and anterior right angle retractor was placed so as to visualized the cuff.  There was an area of superficial bleeding at the medial portion of the cuff which was rendered hemostatic with bovie cautery.  There was a small amount of dark bleeding noted to be coming through the cuff.  Digital exam revealed the cuff to be intact.  Due to the concern for cuff hematoma and for the close proximity of inflamed bowel to the cuff itself. The decision was made to proceed with exploratory laparotomy to facilitate close inspection of the bowel to rule out bowel injury.  A midline vertical incision was made with the knife and carried down to the underlying layer of fascia with the knife.  The fascia was incised in a vertical fashion and extended superiorly and inferiorly using  sharp dissection.  The rectus muscles were separated in the midline.  Dr. Drue Flirt was called at this time and performed the following procedures: entry of the peritoneal cavity, examination of the internal portion of the vaginal cuff, air proctoscopy to rule out rectosigmoid injury, examination of the entire length of the small bowel, and lysis of an omental adhesion close to the umbilicus.  Please see his operative note for details regarding his portion of the procedure.  Of note, no active area of bleeding was identified.  Attention was  turned to closing.  The fascia was reapproximated with 1 PDS in running fashion.  The subcutaneous tissue was irrigated and rendered hemostatic with bovie cautery.  The subcutaneous tissue was reapproximated with 2-0 plaingut suture.  The skin was reapproximated with staples.  Attention was turned back to the vagina.  There was no active bleeding from the cuff however some of the flowseal was noted to be leaking through the small spaces between the cuff sutures.  I attempted to place a superficial figure-of-8 suture to close this space however this was unable to be accomplished and there was concern that repeated attempts may cause further bleeding.  There was no active bleeding at the time the procedure was concluded.  The vagina was packed with Kerlix gauze soaked in betadine.  The patient was returned to the dorsal supine position, awoken, and transferred to the recovery room in stable condition.  All counts were correct.        Signed By: Katrina Gibbons, DO     November 29, 2016

## 2016-11-29 NOTE — ED Notes (Signed)
Large blood clots removed from patients vaginal canal.  She is actively bleeding, cloths in place,  Patient moved to CT.      Verbal order to transfuse two units prior to OR.  Patient is and alert and oriented.    Sister, Nevin Bloodgood, is at the bedside and will assume possesion of patients belongings.     Patient has signed consent and understands procedure.

## 2016-11-29 NOTE — ED Provider Notes (Signed)
51 y.o. female with past medical history significant for fatty liver disease, depression, adrenal adenoma, DMT2, asthma, and HTN who presents from home via private vehicle with chief complaint of vaginal bleeding. Patient had a total laparoscopic hysterectomy on 11/13/16 by Dr. Curt Bears, starting 4 hours post-op with heavy vaginal bleeding. Patient was taken back to the OR for repair of multiple vaginal wall lacerations. Patient presents today reporting starting yesterday with light spotting then this morning starting with heavy, dark blood vaginal bleeding. Pt reports passing multiple dark clots, largest one being the size of a golf ball. Pt reports mild accompanying nausea, vaginal pain, and suprapubic abdominal pain. Pt currently rates pain as a "7/10". Pt reports taking a baby ASA daily, denies any other anticoagulation use. Pt denies any chest pain or SOB. Pt is currently denying any pain medications. There are no other acute medical concerns at this time.    Social hx: Former smoker (Quit 04-25-09); Current EtOH use  PCP: Electa Sniff, MD  OB/GYN: Gerre Pebbles, MD     Note written by Alberteen Spindle, Scribe, as dictated by Andrey Farmer, MD 10:29 AM      The history is provided by the patient and medical records. No language interpreter was used.        Past Medical History:   Diagnosis Date   ??? Abnormal gall bladder diagnostic imaging 07/2000    Abnormal HIDA.  Dr. Tomasita Morrow.   ??? Adrenal adenoma 06/2009    Dr. Mcneil Sober, nephro.  Dr. Boston Service, surgeon.   ??? 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  Dr. Gerilyn Nestle.   ??? Arrhythmia 1989    VT AS A RESULT OF ANESTHESIA   ??? Asthma 01/2015    Dr. Lendell Caprice.   ??? Chest pain of uncertain etiology 33/29/5188    Dr. Sinclair Ship.  Normal Stress Echo 12/26/09 and 09/23/14.  Dr. Maurene Capes.   ??? Chronic insomnia 2009, 2011    Dr. Dineen Kid.   ??? Dyslipidemia 04-26-2007    Improving with weight loss   ??? Essential hypertension 26-Apr-1995     Dr. Mcneil Sober, nephro.  Dr. Eli Phillips, ophth. RESOLVED WITH DIET AND EXERCISE   ??? Family history of breast cancer in mother 58    MRI, 28 mo.  Dr. Gerre Pebbles   ??? Fatty liver 07/25/2007    with elevated AST, ALT.  In NASH study at [facility] (Dr. Hilary Hertz)   ??? Heartburn 07/25/2007    Has improved with weight loss   ??? 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 Major depression, recurrent ([place]) 1990-04-26, 04-26-95, 04/26/07, 05/2013    situational.  fiance's death (04/26/95). Clydell Hakim, counselor.    ??? Nausea & vomiting    ??? Obesity 07/25/2007    Improved with Lifestyle Modifications   ??? OSA (obstructive sleep apnea) 11/2010    Mild.  Txd CPAP 8 cm H20.  Dr. Bretta Bang.  stopped 11/2011.   ??? Osteoarthritis of lumbar spine     MRI 12/14   ??? Osteoarthritis of thoracic spine     MRI 12/14, Stable   ??? Right shoulder pain 04/26/2015    due to spur, DJD.  Dr. Chiquita Loth   ??? Type 2 diabetes, diet controlled ([place]) Apr 26, 2003   ??? Vitamin D deficiency 05/2010       Past Surgical History:   Procedure Laterality Date   ??? APPENDECTOMY  07/2000    Dr. Tomasita Morrow.   ???  HX BREAST REDUCTION  1989    Bilaterally.  Dr. Ashby Dawes.    ??? HX CHOLECYSTECTOMY  07/2000    Gallbladder malfunctioning.  Dr. Tomasita Morrow.   ??? HX OTHER SURGICAL Right 08/01/09    LAP ADRENALECTOMY.  due to benign adrenal adenoma/tumor.  Dr. Boston Service.   ??? HX OTHER SURGICAL  2008, 2013    LIVER BIOPSY X 2.     ??? HX TONSILLECTOMY  1974         Family History:   Problem Relation Age of Onset   ??? Hypertension Mother    ??? Breast Cancer Mother 78   ??? Stroke Mother 69   ??? Anxiety Mother    ??? Depression Mother    ??? Diabetes Father    ??? Hypertension Father    ??? Kidney Disease Father         reversed   ??? High Cholesterol Father    ??? Stroke Sister 21        Carotid Artery Dissection   ??? Obesity Sister    ??? Other Sister         psedotumor cerebri   ??? Diabetes Paternal Grandmother    ??? Hypertension Paternal Grandmother    ??? Cancer Paternal Grandmother          ?LEUKEMIA   ??? Hypertension Paternal Grandfather    ??? High Cholesterol Paternal Grandfather    ??? Diabetes Maternal Grandmother    ??? Hypertension Maternal Grandmother    ??? Diabetes Maternal Grandfather    ??? Hypertension Maternal Grandfather    ??? Cancer Paternal Uncle         PANCREATIC   ??? Cancer Maternal Uncle         PANCREATIC   ??? Anesth Problems Neg Hx        Social History     Socioeconomic History   ??? Marital status: SINGLE     Spouse name: Single   ??? Number of children: 0   ??? Years of education: Not on file   ??? Highest education level: Not on file   Social Needs   ??? Financial resource strain: Not on file   ??? Food insecurity - worry: Not on file   ??? Food insecurity - inability: Not on file   ??? Transportation needs - medical: Not on file   ??? Transportation needs - non-medical: Not on file   Occupational History   ??? Occupation: Film/video editor: [employer]   Tobacco Use   ??? Smoking status: Former Smoker     Packs/day: 0.30     Years: 10.00     Pack years: 3.00     Types: Cigarettes     Last attempt to quit: 02/08/2009     Years since quitting: 7.8   ??? Smokeless tobacco: Never Used   Substance and Sexual Activity   ??? Alcohol use: Yes     Alcohol/week: 0.5 oz     Types: 1 Glasses of wine per week   ??? Drug use: No   ??? Sexual activity: Not Currently     Partners: Male     Birth control/protection: Abstinence   Other Topics Concern   ??? Not on file   Social History Narrative   ??? Not on file         ALLERGIES: Aldactone [spironolactone]; Codeine; and Paxil [paroxetine hcl]    Review of Systems   Constitutional: Negative for activity change, diaphoresis, fatigue and fever.  HENT: Negative for congestion and sore throat.    Eyes: Negative for photophobia and visual disturbance.   Respiratory: Negative for chest tightness and shortness of breath.    Cardiovascular: Negative for chest pain, palpitations and leg swelling.   Gastrointestinal: Positive for abdominal pain and nausea. Negative for  blood in stool, constipation, diarrhea and vomiting.   Genitourinary: Positive for vaginal bleeding and vaginal pain. Negative for difficulty urinating, dysuria, flank pain, frequency and hematuria.   Musculoskeletal: Negative for back pain.   Neurological: Negative for dizziness, syncope, numbness and headaches.   All other systems reviewed and are negative.      Vitals:    11/29/16 1021 11/29/16 1027   Pulse: (!) 130 (!) 109   Resp: 18 18   SpO2: 97% 100%            Physical Exam   Constitutional: She is oriented to person, place, and time. She appears well-developed and well-nourished. No distress.   HENT:   Head: Normocephalic and atraumatic.   Nose: Nose normal.   Mouth/Throat: Oropharynx is clear and moist. No oropharyngeal exudate.   Eyes: Conjunctivae and EOM are normal. Right eye exhibits no discharge. Left eye exhibits no discharge. No scleral icterus.   Neck: Normal range of motion. Neck supple. No JVD present. No tracheal deviation present. No thyromegaly present.   Cardiovascular: Normal rate, regular rhythm, normal heart sounds and intact distal pulses. Exam reveals no gallop and no friction rub.   No murmur heard.  Pulmonary/Chest: Effort normal and breath sounds normal. No stridor. No respiratory distress. She has no wheezes. She has no rales. She exhibits no tenderness.   Abdominal: Bowel sounds are normal. She exhibits no distension and no mass. There is tenderness in the suprapubic area. There is rebound and guarding.   Suprapubic abdominal tenderness with rebound and guarding.    Musculoskeletal: Normal range of motion. She exhibits no edema or tenderness.   Lymphadenopathy:     She has no cervical adenopathy.   Neurological: She is alert and oriented to person, place, and time. No cranial nerve deficit. Coordination normal.   Skin: Skin is warm and dry. No rash noted. She is not diaphoretic. No erythema. No pallor.   Psychiatric: She has a normal mood and affect. Her behavior is normal.  Judgment and thought content normal.   Nursing note and vitals reviewed.     Note written by Alberteen Spindle, Scribe, as dictated by Andrey Farmer, MD 10:29 AM     MDM  Number of Diagnoses or Management Options  Postoperative hemorrhage of vagina following genitourinary procedure:   Diagnosis management comments: A/P:  Postoperative complication.  51 y/o female with recent laparoscopic hysterectomy with vaginal tear complication presenting today with severe lower abd pain and vaginal bleeding.  Concern for dehiscence vs other postsurgical complication.  Cbc, cmp, type & screen, lactate, OB Hospitalist consult.      Reassessment:  Pt seen by OB/Hospitalist, will need to go emergently to OR for exam under anesthesia.  Requesting 4 U PRBC, on hold, CT abd/pelvis.           Amount and/or Complexity of Data Reviewed  Clinical lab tests: ordered and reviewed  Tests in the radiology section of CPT??: reviewed and ordered  Discuss the patient with other providers: yes  Independent visualization of images, tracings, or specimens: yes    Risk of Complications, Morbidity, and/or Mortality  Presenting problems: moderate  Diagnostic procedures: moderate  Management options: moderate  Critical Care  Total time providing critical care: 30-74 minutes (Total critical care time spent exclusive of procedures:  35 minutes)    Patient Progress  Patient progress: stable         Procedures    CONSULT NOTE:  10:54 AM Andrey Farmer, MD spoke with Dr. Shon Millet, Consult for [facility] Hospitalist.  Discussed available diagnostic tests and clinical findings.  She will come see the patient to do an exam.     11:21 AM  Dr. Shon Millet states the patient needs to be taken to the OR for an exam under anesthesia, and would like a CT scan ordered. Type and screen for blood as well for 4 units.     12:34 PM  Radiologist called, patient has active extravasation of a bleeding vessel into the vaginal vault and a post-op fluid collection.

## 2016-11-29 NOTE — ED Triage Notes (Signed)
Pt comes in with c/c of post op problem. Pt had laparoscopic hysterectomy 11/6 with a vaginal tear. Pt states that today she was taking a shower and discharged several large golf ball sized clots.

## 2016-11-29 NOTE — Other (Signed)
TRANSFER - OUT REPORT:    Verbal report given to Cleta Alberts  being transferred to 319 for routine post - op       Report consisted of patient???s Situation, Background, Assessment and   Recommendations(SBAR).     Time Pre op antibiotic given:1332, 1423 see mar  Anesthesia Stop time:1944  Foley Present on Transfer to floor:yes  Order for Foley on Chart:yes  Discharge Prescriptions with Chart:none    Information from the following report(s) OR Summary, Procedure Summary, Intake/Output, MAR, Accordion, Recent Results, Med Rec Status and Cardiac Rhythm nsr was reviewed with the receiving nurse.    Opportunity for questions and clarification was provided.     Is the patient on 02? YES       L/Min 2       Other Nasal cannula    Is the patient on a monitor? NO    Is the nurse transporting with the patient? YES    Surgical Waiting Area notified of patient's transfer from PACU? YES      The following personal items collected during your admission accompanied patient upon transfer:   Dental Appliance: Dental Appliances: None  Vision: Visual Aid: None  Hearing Aid:    Jewelry:    Clothing:    Other Valuables:    Valuables sent to safe:

## 2016-11-29 NOTE — ED Notes (Signed)
Pt to CT via stretcher and 2 RN.  Sister at bedside

## 2016-11-29 NOTE — Other (Signed)
PATIENT RECEIVED TO OR FROM ER BY ER RN, JENNIFER R.. PATIENT INTERVIEWED & DENIED ANY METAL IN BODY, STATED HAS HAD MULTIPLE SURGERIES WITHOUT ANESTHESIA COMPLICATIONS. PATIENT DENIES ANY CUTS, SCRAPES OR BRUISES ON BODY.

## 2016-11-29 NOTE — Other (Signed)
SURGICEL ADDED TO STERILE FIELD; LOT #[id number], EXP: 03/07/2021; & FLOSEAL ADDED TO STERILE FIELD; LOT #[id number], EXP: 04/03/2018

## 2016-11-29 NOTE — Op Note (Signed)
Gynecologic Oncology Operative Report    Katrina [name]  11/29/2016    PREOPERATIVE DIAGNOSIS:  1) Pelvic hematoma; 2) Vaginal bleeding    POSTOPERATIVE DIAGNOSIS:  1) Pelvic hematoma; 2) Vaginal laceration    PROCEDURE:    Exploratory laparotomy, proctoscopy, lysis of omental adhesion    Surgeon:  Loel Lofty, MD    Assistant:  Dr. Maylon Cos and Dr. Anders Simmonds    Anesthesia:  General endotracheal anesthesia    EBL:  See Dr. Jessee Avers operative note for overall values for case    Preoperative antibiotics: 2gm ancef, 5mg /kg gentamicin, 900mg  clindamycin    VTE Prophylaxis: SCDs     Complications:  None    Specimens: none    Operative indications:  Per Dr. Jessee Avers operative note: Katrina [name] is a 51 y/o who is POD 16 s/p TLH BSO for symptomatic uterine fibroids. She presented this morning with profuse vaginal bleeding.  CT scan was concerning for active bleeding into the vagina, for an ~6cm collection above the vaginal cuff, and for an inflamed loop of bowel in close proximity to the cuff.  The planned procedure was discussed with the patient preoperatively as were the risks inherent to the procedure.  All questions were answered and informed consent was obtained and signed.  The patient received 2u PRBC prior to proceeding to the operating room.     Operative findings:  At the time I was called in, Dr. Maylon Cos and Dr. Anders Simmonds had completed a pelvic exam and repaired a vaginal laceration. I did not repeat their pelvic EUA. They had also proceeded with an exploratory laparotomy via a midline incision. On my exploration of the abdomen, the rectosigmoid epiploica and a portion of rectal serosa was adherent to the pelvic floor at the level of the vaginal cuff. There was also a 5-cm blood clot collection in this area. The bowel was freed up and this area examined. A air proctoscopy was performed and the rectosigmoid was intact without injury. There was no evidence of air  leakage. The vaginal cuff was visualized and the sutures noted to be intact. There was no obvious defect from above. There was some mild oozing from along the posterior aspect of the vaginal cuff, but there was a lot of inflammation of the tissue given her recent hysterectomy 2 weeks prior. There was considerable healthy granulation tissue forming along the prior planes of dissection from her hysterectomy. There was no area actively bleeding that was amenable to suture. Therefore FloSeal was placed and pressure held for 5 minutes without any further bleeding. Surgicel was placed overtop as well. The small bowel was then ran from the ileocecal junction to the Ligament of Treitz, and the small bowel was healthy appearing. There was a small 2-cm area of omentum adherent in the midline superior to the midline incision. Another vaginal exam was performed by Dr. Jessee Avers who felt the vaginal cuff was intact. I then turned to the remainder of the case over to Dr. Maylon Cos and Dr. Anders Simmonds who closed the abdominal fascial incision and skin.    Operative note:  I was consulted intraoperatively by Dr. Maylon Cos and Dr. Anders Simmonds. Please see their operative note for findings prior to my portion of the case. In short, they had completed a pelvic EUA and repaired a vaginal laceration. They had also explored the vaginal cuff, which was intact. At the time I entered the operating room, Dr. Shon Millet and Dr. Jessee Avers had initiated an exploratory  laparotomy. They had performed a midline incision below the umbilicus down to the level of the pre-peritoneal fat. At this portion of the case I scrubbed in.     The pre-peritoneal fat was tented up with two tonsils and entered sharply with Metzenbaum scissors. The incision was extended superiorly and inferiorly. A large Alexis retractor was then placed and the small bowel was packed into the upper abdomen to visualize the pelvis. As noted above,  the rectosigmoid colon was adherent to the prior dissection plane along the pelvic floor from the hysterectomy. The bowel adhesions were freed up with blunt digital dissection. There was an underlying 5-cm hematoma, which was evacuated. There was healthy granulation tissue along the pelvic floor dissection planes. Prior to continuing with further exploration, an air proctoscopy was performed to examine the integrity of the rectosigmoid colon. A proctoscope was then inserted into the anus, the lumen of the rectosigmoid was then held closed proximal to the area in the pelvis, sterile saline irrigation was placed in the pelvis, and air was pumped intralumenally via the anal proctoscope. There was no evidence of air bubbles after sufficient insufflation of the rectosigmoid, confirming the integrity of the colon. The air was then released via the proctoscope. Attention was then turned back to the area along the pelvic floor. A sponge-stick was placed in the vagina to identify the vaginal cuff more clearly. The vaginal cuff was then visualized via the laparotomy, and the sutures and cuff were noted to be intact. Given there was not one identifiable site of bleeding, but rather an ooze coming from the posterior aspect of the vaginal cuff, FloSeal was placed and pressure held. While pressure was held, the small bowel was ran from the ileocolic junction to the Ligament of Treitz, and the small bowel was normal and healthy appearing. Attention was then turned back to the pelvis, which was hemostatic. Surgicel was then placed over the site along the pelvic floor. One last digital exam was performed by Dr. Jessee Avers to confirm integrity of the vaginal cuff prior to abdominal closure. The abdominal retractor was then removed. The remainder of the abdomen was normal appearing. The small omental adhesion to the anterior abdominal wall at the level of the umbilicus was taken down with electrocautery. A small omental vessel was  isolated, then double clamped with tonsils, transected, and suture ligated on each end with 2-0 Vicryl. This freed up the omental adhesion. The remainder of the omentum was normal appearing. At this point in the case, I turned the case back over to Dr. Jessee Avers and Dr. Shon Millet. Please see their operative note for further details regarding abdominal closure. As a team, we had also agreed that vaginal packing may help reduce her risk of further bleeding from her prior vaginal lacerations. They also placed vaginal packing at the end of the procedure.     Philemon Kingdom, MD  11/29/2016  8:09 PM

## 2016-11-29 NOTE — Progress Notes (Signed)
Date of previous inpatient admission/ ED visit? Patient last admitted 11/6-11/7 for Fibroids/ Menorrhagia.  RRAT score is 5. Patient with 1 ED visit in the past 6 months.    What brought the patient back to ED? Chart reviewed.  Patient comes in with c/c of post op problem. Patient had laparoscopic hysterectomy 11/6 with a vaginal tear. Patient states that today she was taking a shower and discharged several large golf ball sized clots.    Did patient decline recommended services during last admission/ ED visit (if yes, what)? NO    Has patient seen a provider since their last inpatient admission/ED visit (if yes, when)? NO    CM Interventions:  From previous inpatient admission/ED visit: Patient discharged home with family assistance.  Patient encouraged to follow-up with PCP in the community and follow-up with OBGYN in 2 weeks.  Follow-up placed on AVS.    From current inpatient admission/ED visit: Patient currently in the ED being evaluated and needs assessed.  There are no current CM consults or needs at this time.  Disposition needs TBD/subjectto change pending recommendations.  CM will continue to follow and assist with disposition needs as they arise.    Adele Barthel, MSW/CRM  10:56 AM

## 2016-11-29 NOTE — ED Notes (Signed)
OB at bedside.

## 2016-11-29 NOTE — Anesthesia Post-Procedure Evaluation (Signed)
Procedure(s):  LAPAROTOMY EXPLORATORY, PELVIC EXAM UNDER ANESTHESIA, PROCTOSCOPY.    <BSHSIANPOST>    Visit Vitals  BP 140/79   Pulse 95   Temp 37.1 ??C (98.8 ??F)   Resp 16   Ht 5' 5.5" (1.664 m)   Wt 88.5 kg (195 lb)   SpO2 100%   BMI 31.96 kg/m??

## 2016-11-29 NOTE — Progress Notes (Signed)
Updated by Katrina [name] Hospitalist regarding patient's status.  In brief, Katrina [name] is a 51 y/o POD 16 s/p TLH BSO for symptomatic uterine fibroids.  Her immediate post operative course was complicated by vaginal bleeding which was evaluated and treated with exam under anesthesia and repair of vaginal laceration.  She comes in today with c/o passing large clots in the shower this morning.  Katrina [name] attempted vaginal exam but the bleeding was too profuse to adequately evaluate the vaginal cuff.    On my arrival the patient is in bed, pale, anxious.  She describes abdominal "cramping pain" and "vaginal throbbing."      Vitals: P 90s-130s BPs 140s-160s/70s-80s O2 sat 97-100%    Labs:  H/H 10.6/33.8 (discharge H/H 2w ago 10.1/31.9)    A/P: 51 y/o POD 16 s/p TLH BSO followed by EUA and repair of vaginal laceration now presents with acute, heavy vaginal bleeding.  Concern for intraperitoneal bleeding with cuff dehiscence.  OR team notified and has to be called in to set up for planned procedure which is pelvic exam under anesthesia and possible exploratory laparotomy with any other indicated procedure.  Per nursing staff, anticipate case start within 1 hour.  While awaiting case start, agree with Katrina [name] plan: stat abdominal CT, transfuse 2u PRBC.  4u T+C.  Plan of care discussed with the patient and her sister at bedside.  All questions answered.

## 2016-11-29 NOTE — Progress Notes (Signed)
TRANSFER - IN REPORT:    Verbal report received from [place], RN (name) on Katrina [name]  being received from PACU (unit) for routine progression of care      Report consisted of patient???s Situation, Background, Assessment and   Recommendations(SBAR).     Information from the following report(s) SBAR was reviewed with the receiving nurse.    Opportunity for questions and clarification was provided.      Assessment completed upon patient???s arrival to unit and care assumed.

## 2016-11-29 NOTE — Progress Notes (Signed)
Problem: Falls - Risk of  Goal: *Absence of Falls  Document Schmid Fall Risk and appropriate interventions in the flowsheet.  Fall Risk Interventions:  Mobility Interventions: Assess mobility with egress test         Medication Interventions: Teach patient to arise slowly, Patient to call before getting OOB

## 2016-11-29 NOTE — Op Note (Signed)
Gynecology operative note    Preop diagnosis: Vaginal bleeding    Post operative diagnosis: Vaginal bleeding    Procedure:   1. Pelvic exam under anesthesia  2. Exploratory laparotomy  3. Air proctoscopy (by Dr. Drue Flirt)  4. Lysis of omental adhesion (by Dr. Drue Flirt)    Surgeon: Dr. Anders Simmonds  Co surgeon/Consultant: Dr. Drue Flirt, gyn oncologist  Assistant: Dr. Maylon Cos    Anesthesia: GETA    Antibiotics: 2gm ancef, 5mg /kg gentamicin, 900mg  clindamycin    EBL: 183mL    UOP: 23mL clear    IVF: 2L crystalloid, 267mL albumin    Drains: Foley to gravity    Specimens: None    Complications: None    Findings:   1. Vaginal vault filled with blood clot  2. Superficial bleeding from medial portion of vaginal cuff  3. Vaginal cuff intact  4. No air leak with air proctoscopy  5. 5cm well-organized blood clot above the vaginal cuff, small hemoperitoneum (~141mL)  6. No active bleeding within the peritoneal cavity  7. Inflamed rectosigmoid  8. Normal appearance of entire length of small bowel  9. Omental adhesion to umbilical region    Dispo: Stable to PACU    Indications for procedure: Katrina [name] is a 51 y/o who is POD 16 s/p TLH BSO for symptomatic uterine fibroids. She presented this morning with profuse vaginal bleeding.  CT scan was concerning for active bleeding into the vagina, for an ~6cm collection above the vaginal cuff, and for an inflamed loop of bowel in close proximity to the cuff.  The planned procedure was discussed with the patient preoperatively as were the risks inherent to the procedure.  All questions were answered and informed consent was obtained and signed.  The patient received 2u PRBC prior to proceeding to the operating room.     Procedure: After informed consent was obtained and signed, the patient was taken to the operating room where she was placed in the dorsal supine position.  General anesthesia with obtained and endotracheal intubation  was performed without difficulty.  She was then repositioned into the dorsal lithotomy position with legs in [place].  Care was taken to avoid any underlying nerve damage.  The patient was then prepped and draped in the usual sterile fashion for vaginal and abdominal surgery.  A surgical time out was performed wherein the patient's identity, procedure to be performed, and any surgical risks were verified.  After surgical timeout was completed, the procedure was begun.  A foley catheter was placed in the bladder.  A pelvic exam revealed copious amount of well organized clot in the vagina.  After this clot was manually evacuated, long weighted speculum was placed and anterior right angle retractor was placed so as to visualized the cuff.  There was an area of superficial bleeding at the medial portion of the cuff which was rendered hemostatic with bovie cautery.  There was a small amount of dark bleeding noted to be coming through the cuff.  Digital exam revealed the cuff to be intact.  Due to the concern for cuff hematoma and for the close proximity of inflamed bowel to the cuff itself. The decision was made to proceed with exploratory laparotomy to facilitate close inspection of the bowel to rule out bowel injury.  A midline vertical incision was made with the knife and carried down to the underlying layer of fascia with the knife.  The fascia was incised in a vertical fashion and extended superiorly and inferiorly using  sharp dissection.  The rectus muscles were separated in the midline.  Dr. Drue Flirt was called at this time and performed the following procedures: entry of the peritoneal cavity, examination of the internal portion of the vaginal cuff, air proctoscopy to rule out rectosigmoid injury, examination of the entire length of the small bowel, and lysis of an omental adhesion close to the umbilicus.  Please see his operative note for details  regarding his portion of the procedure.  Of note, no active area of bleeding was identified.  Attention was turned to closing.  The fascia was reapproximated with 1 PDS in running fashion.  The subcutaneous tissue was irrigated and rendered hemostatic with bovie cautery.  The subcutaneous tissue was reapproximated with 2-0 plaingut suture.  The skin was reapproximated with staples.  Attention was turned back to the vagina.  There was no active bleeding from the cuff however some of the flowseal was noted to be leaking through the small spaces between the cuff sutures.  I attempted to place a superficial figure-of-8 suture to close this space however this was unable to be accomplished and there was concern that repeated attempts may cause further bleeding.  There was no active bleeding at the time the procedure was concluded.  The vagina was packed with Kerlix gauze soaked in betadine.  The patient was returned to the dorsal supine position, awoken, and transferred to the recovery room in stable condition.  All counts were correct.        Signed By: Marvetta Gibbons, DO     November 29, 2016

## 2016-11-29 NOTE — Progress Notes (Signed)
Admission Medication Reconciliation:    Information obtained from: Katrina [name]    Significant PMH/Disease States:   Past Medical History:   Diagnosis Date   ??? Abnormal gall bladder diagnostic imaging 07/2000    Abnormal HIDA.  Dr. Tomasita Morrow.   ??? Adrenal adenoma 06/2009    Dr. Mcneil Sober, nephro.  Dr. [place] Service, surgeon.   ??? 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  Dr. Gerilyn Nestle.   ??? Arrhythmia 1989    VT AS A RESULT OF ANESTHESIA   ??? Asthma 01/2015    Dr. Lendell Caprice.   ??? Chest pain of uncertain etiology 66/06/3014    Dr. Sinclair Ship.  Normal Stress Echo 12/26/09 and 09/23/14.  Dr. Maurene Capes.   ??? Chronic insomnia 2009, 2011    Dr. Dineen Kid.   ??? Dyslipidemia 2007-04-25    Improving with weight loss   ??? Essential hypertension 04/25/1995    Dr. Mcneil Sober, nephro.  Dr. Eli Phillips, ophth. RESOLVED WITH DIET AND EXERCISE   ??? Family history of breast cancer in mother 32    MRI, 63 mo.  Dr. Gerre Pebbles   ??? Fatty liver 07/25/2007    with elevated AST, ALT.  In NASH study at [facility] (Dr. Hilary Hertz)   ??? Heartburn 07/25/2007    Has improved with weight loss   ??? 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 Major depression, recurrent ([place]) 04-25-1990, 04/25/1995, 04/25/07, 05/2013    situational.  fiance's death (04/25/1995). Clydell Hakim, counselor.    ??? Nausea & vomiting    ??? Obesity 07/25/2007    Improved with Lifestyle Modifications   ??? OSA (obstructive sleep apnea) 11/2010    Mild.  Txd CPAP 8 cm H20.  Dr. Bretta Bang.  stopped 11/2011.   ??? Osteoarthritis of lumbar spine     MRI 12/14   ??? Osteoarthritis of thoracic spine     MRI 12/14, Stable   ??? Right shoulder pain 04/25/15    due to spur, DJD.  Dr. Chiquita Loth   ??? Type 2 diabetes, diet controlled ([place]) 04-25-2003   ??? Vitamin D deficiency 05/2010       Chief Complaint for this Admission:  Post op complication    Allergies:  Aldactone [spironolactone]; Codeine; and Paxil [paroxetine hcl]    Prior to Admission Medications:   Prior to Admission Medications    Prescriptions Last Dose Informant Katrina [name] Reported? Taking?   ASHWAGANDHA ROOT EXTRACT,BULK, 11/22/2016 at Unknown time Self Yes Yes   Sig: 2 Tabs by Does Not Apply route daily.   EVENING PRIMROSE OIL (EVENING PRIMROSE PO)  Self Yes No   Sig: Take 1 Tab by mouth two (2) times a day.   OTHER 11/22/2016 at Unknown time Self Yes Yes   Sig: 1 Tab daily. BERBERINE 1, 000 MCG   Omega-3 Fatty Acids (FISH OIL) 500 mg cap 11/22/2016 at Unknown time Self Yes Yes   Sig: Take 1 Tab by mouth daily.   SOY ISOFLA/BLK COHOSH/MAG BARK (ESTROVEN PO) 11/22/2016 at Unknown time Self Yes Yes   Sig: Take 1 Tab by mouth daily.   albuterol (PROVENTIL HFA, VENTOLIN HFA, PROAIR HFA) 90 mcg/actuation inhaler 10/29/2016 at Unknown time  No Yes   Sig: Take 2 Puffs by inhalation every four (4) hours as needed for Shortness of Breath. Indications: BRONCHOSPASM PREVENTION   ascorbic acid (VITAMIN C) 500 mg tablet 11/22/2016 at Unknown time Self Yes Yes   Sig: Take 1,000 mg by mouth  daily.   aspirin delayed-release 81 mg tablet 11/28/2016 at Unknown time Self No Yes   Sig: Take 1 Tab by mouth daily. Indications: prevention of transient ischemic attack   Katrina [name] taking differently: Take 81 mg by mouth nightly.   b complex vitamins tablet 11/22/2016 at Unknown time Self Yes Yes   Sig: Take 1 Tab by mouth daily.     cholecalciferol, VITAMIN D3, (VITAMIN D3) 5,000 unit tab tablet 11/22/2016 at Unknown time Self Yes Yes   Sig: Take 5,000 Units by mouth daily.   cinnamon bark (CINNAMON) 500 mg cap 11/22/2016 at Unknown time Self Yes Yes   Sig: Take 1 Tab by mouth daily.   cyanocobalamin (VITAMIN B-12) 1,000 mcg tablet 11/22/2016 at Unknown time Self Yes Yes   Sig: Take 1,000 mcg by mouth daily.   ibuprofen (MOTRIN) 800 mg tablet 11/28/2016 at Unknown time  No Yes   Sig: Take 1 Tab by mouth every eight (8) hours as needed for Pain.   levothyroxine (SYNTHROID) 100 mcg tablet 11/29/2016 at Unknown time Self No Yes    Sig: Take 1 Tab by mouth Daily (before breakfast). Indications: hypothyroidism   metFORMIN ER (GLUCOPHAGE XR) 500 mg tablet 11/28/2016 at 0900 Self No Yes   Sig: TAKE 2 TABLETS BEFORE BREAKFAST AND DINNER FOR TYPE 2 DIABETES MELLITUS   oxyCODONE-acetaminophen (PERCOCET) 5-325 mg per tablet 11/28/2016 at Unknown time  No Yes   Sig: Take 1-2 Tabs by mouth every four (4) hours as needed. Max Daily Amount: 12 Tabs.   raNITIdine (ZANTAC) 150 mg tablet 11/29/2016 at Unknown time Self Yes Yes   Sig: Take 150 mg by mouth two (2) times a day.   vitamin E (AQUA GEMS) 400 unit capsule 11/22/2016 at Unknown time Self Yes Yes   Sig: Take 400 Units by mouth daily.      Facility-Administered Medications: None         Comments/Recommendations: Katrina [name] provided medication and allergy information.    Deleted:  1. Magnesium (calm brand)  2. Erythromycin    Thank you for allowing me to participate in the care of your Katrina [name].    Oletta Darter PharmD, RN [phone]

## 2016-11-29 NOTE — H&P (Signed)
Gynecology History and Physical    Name: Katrina [name] MRN: [id number] SSN: [id number]    Date of Birth: 01-Apr-1965  Age: 51 y.o.  Sex: female       Subjective:      Chief complaint:  Acute vaginal bleeding    Katrina [name] is a 51 y.o. Caucasian female  S/P laparoscopic hysterectomy on 11/6 plus vaginal wall laceration repaired the same day presented to the ER today with c/o vaginal bleeding .  Patient states that she had her postoperative check with Dr. Curt Bears on Monday and was told that her cuff was healing well.  She did have some spotting yesterday and and then this morning at the shower she started gushing blood and has been passing clots since then.  Accompanied by abdominal cramps , no fever with chills, no nausea/vomiting, chest pain or shortness of breath.  States she has been on Bactrim since Tuesday for UTI but has been taking 1 pill per day.  The current method of family planning is none.    OB History     Gravida Para Term Preterm AB Living    0 0 0 0 0 0    SAB TAB Ectopic Molar Multiple Live Births    0 0 0 0 0 0        Past Medical History:   Diagnosis Date   ??? Abnormal gall bladder diagnostic imaging 07/2000    Abnormal HIDA.  Dr. Tomasita Morrow.   ??? Adrenal adenoma 06/2009    Dr. Mcneil Sober, nephro.  Dr. [place] Service, surgeon.   ??? 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  Dr. Gerilyn Nestle.   ??? Arrhythmia 1989    VT AS A RESULT OF ANESTHESIA   ??? Asthma 01/2015    Dr. Lendell Caprice.   ??? Chest pain of uncertain etiology 95/18/8416    Dr. Sinclair Ship.  Normal Stress Echo 12/26/09 and 09/23/14.  Dr. Maurene Capes.   ??? Chronic insomnia 2009, 2011    Dr. Dineen Kid.   ??? Dyslipidemia 04-30-2007    Improving with weight loss   ??? Essential hypertension April 30, 1995    Dr. Mcneil Sober, nephro.  Dr. Eli Phillips, ophth. RESOLVED WITH DIET AND EXERCISE   ??? Family history of breast cancer in mother 41    MRI, 66 mo.  Dr. Gerre Pebbles   ??? Fatty liver 07/25/2007     with elevated AST, ALT.  In NASH study at [facility] (Dr. Hilary Hertz)   ??? Heartburn 07/25/2007    Has improved with weight loss   ??? 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 Major depression, recurrent ([place]) April 30, 1990, 1995/04/30, 2007-04-30, 05/2013    situational.  fiance's death (April 30, 1995). Clydell Hakim, counselor.    ??? Nausea & vomiting    ??? Obesity 07/25/2007    Improved with Lifestyle Modifications   ??? OSA (obstructive sleep apnea) 11/2010    Mild.  Txd CPAP 8 cm H20.  Dr. Bretta Bang.  stopped 11/2011.   ??? Osteoarthritis of lumbar spine     MRI 12/14   ??? Osteoarthritis of thoracic spine     MRI 12/14, Stable   ??? Right shoulder pain 2015/04/30    due to spur, DJD.  Dr. Chiquita Loth   ??? Type 2 diabetes, diet controlled ([place]) 2003-04-30   ??? Vitamin D deficiency 05/2010     Past Surgical History:   Procedure Laterality Date   ??? APPENDECTOMY  07/2000    Dr. Steward Drone  Gerilyn Nestle.   ??? HX BREAST REDUCTION  1989    Bilaterally.  Dr. Ashby Dawes.    ??? HX CHOLECYSTECTOMY  07/2000    Gallbladder malfunctioning.  Dr. Tomasita Morrow.   ??? HX OTHER SURGICAL Right 08/01/09    LAP ADRENALECTOMY.  due to benign adrenal adenoma/tumor.  Dr. [place] Service.   ??? HX OTHER SURGICAL  2008, 2013    LIVER BIOPSY X 2.     ??? HX TONSILLECTOMY  1974     Social History     Occupational History   ??? Occupation: Film/video editor: [employer]   Tobacco Use   ??? Smoking status: Former Smoker     Packs/day: 0.30     Years: 10.00     Pack years: 3.00     Types: Cigarettes     Last attempt to quit: 02/08/2009     Years since quitting: 7.8   ??? Smokeless tobacco: Never Used   Substance and Sexual Activity   ??? Alcohol use: Yes     Alcohol/week: 0.5 oz     Types: 1 Glasses of wine per week   ??? Drug use: No   ??? Sexual activity: Not Currently     Partners: Male     Birth control/protection: Abstinence     Family History   Problem Relation Age of Onset   ??? Hypertension Mother    ??? Breast Cancer Mother 39   ??? Stroke Mother 20   ??? Anxiety Mother    ??? Depression Mother     ??? Diabetes Father    ??? Hypertension Father    ??? Kidney Disease Father         reversed   ??? High Cholesterol Father    ??? Stroke Sister 21        Carotid Artery Dissection   ??? Obesity Sister    ??? Other Sister         psedotumor cerebri   ??? Diabetes Paternal Grandmother    ??? Hypertension Paternal Grandmother    ??? Cancer Paternal Grandmother         ?LEUKEMIA   ??? Hypertension Paternal Grandfather    ??? High Cholesterol Paternal Grandfather    ??? Diabetes Maternal Grandmother    ??? Hypertension Maternal Grandmother    ??? Diabetes Maternal Grandfather    ??? Hypertension Maternal Grandfather    ??? Cancer Paternal Uncle         PANCREATIC   ??? Cancer Maternal Uncle         PANCREATIC   ??? Anesth Problems Neg Hx         Allergies   Allergen Reactions   ??? Aldactone [Spironolactone] Swelling and Hives   ??? Codeine Rash and Itching   ??? Paxil [Paroxetine Hcl] Other (comments)     WORSENED DEPRESSION.     Prior to Admission medications    Medication Sig Start Date End Date Taking? Authorizing Provider   oxyCODONE-acetaminophen (PERCOCET) 5-325 mg per tablet Take 1-2 Tabs by mouth every four (4) hours as needed. Max Daily Amount: 12 Tabs. 11/14/16   Gerre Pebbles, MD   ibuprofen (MOTRIN) 800 mg tablet Take 1 Tab by mouth every eight (8) hours as needed for Pain. 11/14/16   Gerre Pebbles, MD   cinnamon bark (CINNAMON) 500 mg cap Take 1 Tab by mouth daily.    Provider, Historical   OTHER 1 Tab daily. BERBERINE 1, 000 MCG    Provider, Historical  erythromycin (ILOTYCIN) ophthalmic ointment Administer  to right eye two (2) times a day. FOR THREE DAYS    Provider, Historical   SOY ISOFLA/BLK COHOSH/MAG BARK (ESTROVEN PO) Take 1 Tab by mouth daily.    Provider, Historical   EVENING PRIMROSE OIL (EVENING PRIMROSE PO) Take 1 Tab by mouth two (2) times a day.    Provider, Historical   ASHWAGANDHA ROOT EXTRACT,BULK, 2 Tabs by Does Not Apply route daily.    Provider, Historical   OTHER nightly as needed.    Provider, Historical    cholecalciferol, VITAMIN D3, (VITAMIN D3) 5,000 unit tab tablet Take 5,000 Units by mouth daily.    Provider, Historical   cyanocobalamin (VITAMIN B-12) 1,000 mcg tablet Take 1,000 mcg by mouth daily.    Provider, Historical   Omega-3 Fatty Acids (FISH OIL) 500 mg cap Take 1 Tab by mouth daily.    Provider, Historical   aspirin delayed-release 81 mg tablet Take 1 Tab by mouth daily. Indications: prevention of transient ischemic attack  Patient taking differently: Take 81 mg by mouth nightly. 06/10/15   Felix Ahmadi R, DO   levothyroxine (SYNTHROID) 100 mcg tablet Take 1 Tab by mouth Daily (before breakfast). Indications: hypothyroidism 05/31/15   Felix Ahmadi R, DO   raNITIdine (ZANTAC) 150 mg tablet Take 150 mg by mouth two (2) times a day.    Provider, Historical   metFORMIN ER (GLUCOPHAGE XR) 500 mg tablet TAKE 2 TABLETS BEFORE BREAKFAST AND DINNER FOR TYPE 2 DIABETES MELLITUS 12/06/14   Felix Ahmadi R, DO   albuterol (PROVENTIL HFA, VENTOLIN HFA, PROAIR HFA) 90 mcg/actuation inhaler Take 2 Puffs by inhalation every four (4) hours as needed for Shortness of Breath. Indications: BRONCHOSPASM PREVENTION 12/01/14   Felix Ahmadi R, DO   b complex vitamins tablet Take 1 Tab by mouth daily.      Provider, Historical   ascorbic acid (VITAMIN C) 500 mg tablet Take 1,000 mg by mouth daily.    Provider, Historical   vitamin E (AQUA GEMS) 400 unit capsule Take 400 Units by mouth daily. 11/02/08   Provider, Historical        Review of Systems  A comprehensive review of systems was negative except for that written in the History of Present Illness.    Objective:     Vitals:    11/29/16 1021 11/29/16 1027 11/29/16 1032   BP:   186/79   Pulse: (!) 130 (!) 109    Resp: 18 18    Temp:  98.7 ??F (37.1 ??C)    SpO2: 97% 100%        Physical Exam:  Patient without distress.  Heart: Regular rate and rhythm  Lung: normal respiratory effort  Abdomen: soft, nontender, mild lower abdominal distension   External Genitalia: normal general appearance  Urinary system: urethral meatus normal  Vagina: normal mucosa without prolapse or lesions and clots in the vagina, approx 500 cc evacuated  Vaginal cuff- Not visualized due to blood clots filling up the vagina  Ext B/L  No edema, no cuff tenderness    Assessment/Plan:     51 y/o S/P Total Laparoscopic hysterectomy with repair of vaginal wall laceration on 11/6 presented with Acute vaginal bleeding  Plan for EUA and has been consented for Possible Ex- Lap and any indicated procedure and blood transfusion.  Informed consent obtained and all questions answered  NPO  Type and crossmatch 4 Units of PRBC  Dr. Jessee Avers VPFW aware and updated on plan of  care    Signed By:  Maylon Cos, MD     November 29, 2016

## 2016-11-29 NOTE — Brief Op Note (Signed)
BRIEF OPERATIVE NOTE    Date of Procedure: 11/29/2016   Preoperative Diagnosis: Vaginal Bleeding  Postoperative Diagnosis: Vaginal Bleeding    Procedure(s):  LAPAROTOMY EXPLORATORY  Surgeon(s) and Role:     Maylon Cos, MD - Assisting     * Philemon Kingdom, MD - Primary     * Marvetta Gibbons., DO - Co-Surgeon         Surgical Assistant: n/a    Surgical Staff:  Circ-1: Gae Dry, RN  Scrub Tech-1: Joanell Rising  Surg Asst-1: Arnetha Courser  Event Time In Time Out   Incision Start 1406    Incision Close       Anesthesia: General   Estimated Blood Loss: ,50cc  Specimens: * No specimens in log *   Findings: At the time I was called in, Dr. Manuela Neptune and Dr. Jessee Avers had completed a pelvic exam and repaired a vaginal laceration. I did not repeat their pelvic EUA. They had proceeded with an exploratory laparotomy. On my exploration of the abdomen, the rectosigmoid epiploica was adherent to the pelvic floor at the level of the vaginal cuff. There was a about a 5-cm blood clot in this area. The bowel was freed up and this area examined. A air proctoscopy was performed and the rectosigmoid was intact without injury. There was no evidence of air leakage. The vaginal cuff was visualized and the sutures noted to be intact. There was no obvious defect from above. There was some mild oozing from along the posterior aspect of the vaginal cuff, but there was a lot of inflammation of the tissue given her recent hysterectomy 2 weeks prior. There was no area that was amenable to suture. Therefore FloSeal was placed and pressure held for 5 minutes without any further bleeding. Surgicel was placed overtop as well. The small bowel was then ran from the ileocecal junction to the Ligament of Treitz, and the small bowel was healthy appearing. There was a small 2-cm area of omentum adherent in the midline superior to the midline incision. Another vaginal exam was performed by  Dr. Jessee Avers who felt the vaginal cuff was intact. I then turned to the remainder of the case over to Dr. Manuela Neptune and Dr. Jessee Avers who closed the abdominal fascial incision and skin.  Complications: none  Implants: * No implants in log *    Philemon Kingdom, MD

## 2016-11-29 NOTE — Anesthesia Pre-Procedure Evaluation (Addendum)
Anesthetic History     PONV          Review of Systems / Medical History  Patient summary reviewed, nursing notes reviewed and pertinent labs reviewed    Pulmonary        Sleep apnea    Asthma        Neuro/Psych         Psychiatric history     Cardiovascular    Hypertension        Dysrhythmias            GI/Hepatic/Renal     GERD           Endo/Other    Diabetes  Hypothyroidism  Obesity and arthritis     Other Findings              Physical Exam    Airway  Mallampati: II  TM Distance: > 6 cm  Neck ROM: normal range of motion   Mouth opening: Normal     Cardiovascular  Regular rate and rhythm,  S1 and S2 normal,  no murmur, click, rub, or gallop             Dental  No notable dental hx       Pulmonary  Breath sounds clear to auscultation               Abdominal  GI exam deferred       Other Findings            Anesthetic Plan    ASA: 3, emergent  Anesthesia type: general          Induction: Intravenous  Anesthetic plan and risks discussed with: Patient

## 2016-11-29 NOTE — Progress Notes (Addendum)
Bedside and Verbal shift change report given to Sonia Baller RN (oncoming nurse) by Martyn Ehrich (offgoing nurse). Report included the following information SBAR, Kardex, Intake/Output, MAR and Recent Results.     2725: Small increase in drainage at bottom of incision. Reinforced with 4x4 and special tape. Will continue to monitor. Vitals stable. Urine output WDL. Pt ambulated in hallway with no light headedness and no dizziness.

## 2016-11-29 NOTE — ED Notes (Signed)
Pt to OR with RN and PCT via stretcher

## 2016-11-29 NOTE — Op Note (Signed)
Gynecologic Oncology Operative Report    Katrina [name]  11/29/2016    PREOPERATIVE DIAGNOSIS:  1) Pelvic hematoma; 2) Vaginal bleeding    POSTOPERATIVE DIAGNOSIS:  1) Pelvic hematoma; 2) Vaginal laceration    PROCEDURE:    Exploratory laparotomy, proctoscopy, lysis of omental adhesion    Surgeon:  Loel Lofty, MD    Assistant:  Dr. Maylon Cos and Dr. Anders Simmonds    Anesthesia:  General endotracheal anesthesia    EBL:  See Dr. Jessee Avers operative note for overall values for case    Preoperative antibiotics: 2gm ancef, 5mg /kg gentamicin, 900mg  clindamycin    VTE Prophylaxis: SCDs     Complications:  None    Specimens: none    Operative indications:  Per Dr. Jessee Avers operative note: Katrina [name] is a 51 y/o who is POD 16 s/p TLH BSO for symptomatic uterine fibroids. She presented this morning with profuse vaginal bleeding.  CT scan was concerning for active bleeding into the vagina, for an ~6cm collection above the vaginal cuff, and for an inflamed loop of bowel in close proximity to the cuff.  The planned procedure was discussed with the patient preoperatively as were the risks inherent to the procedure.  All questions were answered and informed consent was obtained and signed.  The patient received 2u PRBC prior to proceeding to the operating room.     Operative findings:  At the time I was called in, Dr. Maylon Cos and Dr. Anders Simmonds had completed a pelvic exam and repaired a vaginal laceration. I did not repeat their pelvic EUA. They had also proceeded with an exploratory laparotomy via a midline incision. On my exploration of the abdomen, the rectosigmoid epiploica and a portion of rectal serosa was adherent to the pelvic floor at the level of the vaginal cuff. There was also a 5-cm blood clot collection in this area. The bowel was freed up and this area examined. A air proctoscopy was performed and the rectosigmoid was intact without injury. There was no evidence of air leakage. The vaginal cuff was  visualized and the sutures noted to be intact. There was no obvious defect from above. There was some mild oozing from along the posterior aspect of the vaginal cuff, but there was a lot of inflammation of the tissue given her recent hysterectomy 2 weeks prior. There was considerable healthy granulation tissue forming along the prior planes of dissection from her hysterectomy. There was no area actively bleeding that was amenable to suture. Therefore FloSeal was placed and pressure held for 5 minutes without any further bleeding. Surgicel was placed overtop as well. The small bowel was then ran from the ileocecal junction to the Ligament of Treitz, and the small bowel was healthy appearing. There was a small 2-cm area of omentum adherent in the midline superior to the midline incision. Another vaginal exam was performed by Dr. Jessee Avers who felt the vaginal cuff was intact. I then turned to the remainder of the case over to Dr. Maylon Cos and Dr. Anders Simmonds who closed the abdominal fascial incision and skin.    Operative note:  I was consulted intraoperatively by Dr. Maylon Cos and Dr. Anders Simmonds. Please see their operative note for findings prior to my portion of the case. In short, they had completed a pelvic EUA and repaired a vaginal laceration. They had also explored the vaginal cuff, which was intact. At the time I entered the operating room, Dr. Shon Millet and Dr. Jessee Avers had initiated an exploratory  laparotomy. They had performed a midline incision below the umbilicus down to the level of the pre-peritoneal fat. At this portion of the case I scrubbed in.     The pre-peritoneal fat was tented up with two tonsils and entered sharply with Metzenbaum scissors. The incision was extended superiorly and inferiorly. A large Alexis retractor was then placed and the small bowel was packed into the upper abdomen to visualize the pelvis. As noted above, the rectosigmoid colon was adherent to the prior dissection plane  along the pelvic floor from the hysterectomy. The bowel adhesions were freed up with blunt digital dissection. There was an underlying 5-cm hematoma, which was evacuated. There was healthy granulation tissue along the pelvic floor dissection planes. Prior to continuing with further exploration, an air proctoscopy was performed to examine the integrity of the rectosigmoid colon. A proctoscope was then inserted into the anus, the lumen of the rectosigmoid was then held closed proximal to the area in the pelvis, sterile saline irrigation was placed in the pelvis, and air was pumped intralumenally via the anal proctoscope. There was no evidence of air bubbles after sufficient insufflation of the rectosigmoid, confirming the integrity of the colon. The air was then released via the proctoscope. Attention was then turned back to the area along the pelvic floor. A sponge-stick was placed in the vagina to identify the vaginal cuff more clearly. The vaginal cuff was then visualized via the laparotomy, and the sutures and cuff were noted to be intact. Given there was not one identifiable site of bleeding, but rather an ooze coming from the posterior aspect of the vaginal cuff, FloSeal was placed and pressure held. While pressure was held, the small bowel was ran from the ileocolic junction to the Ligament of Treitz, and the small bowel was normal and healthy appearing. Attention was then turned back to the pelvis, which was hemostatic. Surgicel was then placed over the site along the pelvic floor. One last digital exam was performed by Dr. Jessee Avers to confirm integrity of the vaginal cuff prior to abdominal closure. The abdominal retractor was then removed. The remainder of the abdomen was normal appearing. The small omental adhesion to the anterior abdominal wall at the level of the umbilicus was taken down with electrocautery. A small omental vessel was isolated, then double clamped with tonsils, transected, and suture ligated  on each end with 2-0 Vicryl. This freed up the omental adhesion. The remainder of the omentum was normal appearing. At this point in the case, I turned the case back over to Dr. Jessee Avers and Dr. Shon Millet. Please see their operative note for further details regarding abdominal closure. As a team, we had also agreed that vaginal packing may help reduce her risk of further bleeding from her prior vaginal lacerations. They also placed vaginal packing at the end of the procedure.     Philemon Kingdom, MD  11/29/2016  8:09 PM

## 2016-11-30 LAB — TYPE AND SCREEN
ABO/Rh: O POS
Antibody Screen: NEGATIVE
Status: TRANSFUSED
Status: TRANSFUSED
Unit Divison: 0
Unit Divison: 0
Unit Divison: 0
Unit Divison: 0

## 2016-11-30 LAB — TYPE & SCREEN
ABO/Rh(D): O POS
Antibody screen: NEGATIVE
Status of unit: TRANSFUSED
Status of unit: TRANSFUSED
Unit division: 0
Unit division: 0
Unit division: 0
Unit division: 0

## 2016-11-30 LAB — GLUCOSE, POC
Glucose (POC): 126 mg/dL — ABNORMAL HIGH (ref 65–100)
Glucose (POC): 128 mg/dL — ABNORMAL HIGH (ref 65–100)

## 2016-11-30 LAB — METABOLIC PANEL, BASIC
Anion gap: 9 mmol/L (ref 5–15)
BUN/Creatinine ratio: 18 (ref 12–20)
BUN: 12 MG/DL (ref 6–20)
CO2: 23 mmol/L (ref 21–32)
Calcium: 8.6 MG/DL (ref 8.5–10.1)
Chloride: 107 mmol/L (ref 97–108)
Creatinine: 0.66 MG/DL (ref 0.55–1.02)
GFR est AA: >60 mL/min/{1.73_m2} (ref >60)
GFR est non-AA: >60 mL/min/{1.73_m2} (ref >60)
Glucose: 114 mg/dL — ABNORMAL HIGH (ref 65–100)
Potassium: 4.4 mmol/L (ref 3.5–5.1)
Sodium: 139 mmol/L (ref 136–145)

## 2016-11-30 LAB — CBC WITH AUTOMATED DIFF
ABS. BASOPHILS: 0 10*3/uL (ref 0.0–0.1)
ABS. EOSINOPHILS: 0.1 10*3/uL (ref 0.0–0.4)
ABS. IMM. GRANS.: 0 10*3/uL
ABS. LYMPHOCYTES: 1.4 10*3/uL (ref 0.8–3.5)
ABS. MONOCYTES: 0.4 10*3/uL (ref 0.0–1.0)
ABS. NEUTROPHILS: 10.4 10*3/uL — ABNORMAL HIGH (ref 1.8–8.0)
ABSOLUTE NRBC: 0 10*3/uL (ref 0.00–0.01)
BAND NEUTROPHILS: 1 % (ref 0–6)
BASOPHILS: 0 % (ref 0–1)
EOSINOPHILS: 1 % (ref 0–7)
HCT: 28.1 % — ABNORMAL LOW (ref 35.0–47.0)
HGB: 8.9 g/dL — ABNORMAL LOW (ref 11.5–16.0)
IMMATURE GRANULOCYTES: 0 %
LYMPHOCYTES: 11 % — ABNORMAL LOW (ref 12–49)
MCH: 27.3 PG (ref 26.0–34.0)
MCHC: 31.7 g/dL (ref 30.0–36.5)
MCV: 86.2 FL (ref 80.0–99.0)
MONOCYTES: 3 % — ABNORMAL LOW (ref 5–13)
MPV: 10.8 FL (ref 8.9–12.9)
NEUTROPHILS: 84 % — ABNORMAL HIGH (ref 32–75)
NRBC: 0 PER 100 WBC
PLATELET: 407 10*3/uL — ABNORMAL HIGH (ref 150–400)
RBC: 3.26 M/uL — ABNORMAL LOW (ref 3.80–5.20)
RDW: 14.3 % (ref 11.5–14.5)
WBC: 12.3 10*3/uL — ABNORMAL HIGH (ref 3.6–11.0)

## 2016-11-30 LAB — POC HEMOGLOBIN
Hemoglobin (POC): 10 g/dL — ABNORMAL LOW (ref 11.5–16.0)
Hemoglobin (POC): 10.1 g/dL — ABNORMAL LOW (ref 11.5–16.0)
Hemoglobin (POC): 8.8 g/dL — ABNORMAL LOW (ref 11.5–16.0)

## 2016-11-30 MED ORDER — GLUCOSE 4 GRAM CHEWABLE TAB
4 gram | ORAL | Status: DC | PRN
Start: 2016-11-30 — End: 2016-12-02

## 2016-11-30 MED ORDER — INSULIN LISPRO 100 UNIT/ML INJECTION
100 unit/mL | Freq: Four times a day (QID) | SUBCUTANEOUS | Status: DC
Start: 2016-11-30 — End: 2016-12-02

## 2016-11-30 MED ORDER — ENOXAPARIN 40 MG/0.4 ML SUB-Q SYRINGE
40 mg/0.4 mL | SUBCUTANEOUS | Status: DC
Start: 2016-11-30 — End: 2016-12-02
  Administered 2016-12-01 – 2016-12-02 (×2): via SUBCUTANEOUS

## 2016-11-30 MED ORDER — DEXTROSE 50% IN WATER (D50W) IV SYRG
INTRAVENOUS | Status: DC | PRN
Start: 2016-11-30 — End: 2016-12-02

## 2016-11-30 MED ORDER — GLUCAGON 1 MG INJECTION
1 mg | INTRAMUSCULAR | Status: DC | PRN
Start: 2016-11-30 — End: 2016-12-02

## 2016-11-30 MED FILL — LEVOXYL 100 MCG TABLET: 100 mcg | ORAL | Qty: 1

## 2016-11-30 MED FILL — ALBUTEIN 5 % INTRAVENOUS SOLUTION: 5 % | INTRAVENOUS | Qty: 250

## 2016-11-30 MED FILL — FAMOTIDINE 20 MG TAB: 20 mg | ORAL | Qty: 1

## 2016-11-30 MED FILL — IBUPROFEN 400 MG TAB: 400 mg | ORAL | Qty: 2

## 2016-11-30 MED FILL — NORMAL SALINE FLUSH 0.9 % INJECTION SYRINGE: INTRAMUSCULAR | Qty: 10

## 2016-11-30 MED FILL — ONDANSETRON (PF) 4 MG/2 ML INJECTION: 4 mg/2 mL | INTRAMUSCULAR | Qty: 2

## 2016-11-30 MED FILL — OXYCODONE-ACETAMINOPHEN 5 MG-325 MG TAB: 5-325 mg | ORAL | Qty: 1

## 2016-11-30 MED FILL — DOK 100 MG CAPSULE: 100 mg | ORAL | Qty: 1

## 2016-11-30 MED FILL — PRECEDEX 100 MCG/ML INTRAVENOUS SOLUTION: 100 mcg/mL | INTRAVENOUS | Qty: 12

## 2016-11-30 MED FILL — ROCURONIUM 10 MG/ML IV: 10 mg/mL | INTRAVENOUS | Qty: 120

## 2016-11-30 MED FILL — XYLOCAINE-MPF 20 MG/ML (2 %) INJECTION SOLUTION: 20 mg/mL (2 %) | INTRAMUSCULAR | Qty: 60

## 2016-11-30 MED FILL — CLINDAMYCIN IN D5W 900 MG/50 ML IV PIGGY BACK: 900 mg/50 mL | INTRAVENOUS | Qty: 50

## 2016-11-30 MED FILL — HYDROMORPHONE 2 MG/ML INJECTION SOLUTION: 2 mg/mL | INTRAMUSCULAR | Qty: 1

## 2016-11-30 MED FILL — DEXAMETHASONE SODIUM PHOSPHATE 4 MG/ML IJ SOLN: 4 mg/mL | INTRAMUSCULAR | Qty: 2

## 2016-11-30 MED FILL — DIPRIVAN 10 MG/ML INTRAVENOUS EMULSION: 10 mg/mL | INTRAVENOUS | Qty: 10

## 2016-11-30 NOTE — Other (Deleted)
Please clarify if this patient is (was) being treated/managed for:     => Acute Blood Loss Anemia in the setting of vaginal bleeding requiring fluid bolus, IVFs, 2 units PRBCs, lab monitoring   => Other explanation of clinical findings  => Clinically Undetermined (no explanation for clinical findings)    The medical record reflects the following clinical findings, treatment, and risk factors.    Risk Factors: vaginal bleeding s/p recent surgery (TLH)   Clinical Indicators:  HGB: 8.9; H&P- S/P Total Laparoscopic hysterectomy with repair of vaginal wall laceration on 11/6 presented with Acute vaginal bleeding; 11/23 PN- s/p 2 units of PRBC yesterday. Her vitals are appropriate this morning, as are her labs. Will continue to trend Hgb/Hct daily. Vaginal packing to remain in place.  Treatment: fluid bolus, 2 units PRBCs, lab monitoring     Please clarify and document your clinical opinion in the progress notes and discharge summary including the definitive and/or presumptive diagnosis, (suspected or probable), related to the above clinical findings. Please include clinical findings supporting your diagnosis.      Thank you,    Maylon Peppers, RN  CDMP  [phone]

## 2016-11-30 NOTE — Progress Notes (Signed)
Problem: Falls - Risk of  Goal: *Absence of Falls  Document Schmid Fall Risk and appropriate interventions in the flowsheet.  Outcome: Progressing Towards Goal  Fall Risk Interventions:  Mobility Interventions: Assess mobility with egress test         Medication Interventions: Teach patient to arise slowly    Elimination Interventions: Patient to call for help with toileting needs             Problem: Pressure Injury - Risk of  Goal: *Prevention of pressure injury  Document Braden Scale and appropriate interventions in the flowsheet.  Outcome: Progressing Towards Goal  Pressure Injury Interventions:            Activity Interventions: Increase time out of bed    Mobility Interventions: Pressure redistribution bed/mattress (bed type)    Nutrition Interventions: Document food/fluid/supplement intake

## 2016-11-30 NOTE — Progress Notes (Signed)
Gynecology             POD 1    Patient is POD 1 s/p pelvic EUA and exploratory laparotomy for heavy vaginal bleeding in the context of recent [place] BSO (11/6).  Patient reports her pain is well controlled.  She has ambulated x1.  Tolerating minimal PO intake.  Denies sob/cp/fevers/chills/dizziness/lightheadedness.      Vitals:  Visit Vitals  BP 123/74 (BP 1 Location: Right arm, BP Patient Position: At rest)   Pulse 74   Temp 98.3 ??F (36.8 ??C)   Resp 16   Ht 5' 5.5" (1.664 m)   Wt 88.5 kg (195 lb)   LMP 11/01/2016 (Approximate)   SpO2 100%   BMI 31.96 kg/m??     Temp (24hrs), Avg:98.7 ??F (37.1 ??C), Min:98.3 ??F (36.8 ??C), Max:99.3 ??F (37.4 ??C)      Last 24hr Input/Output:    Intake/Output Summary (Last 24 hours) at 11/30/2016 0807  Last data filed at 11/30/2016 8469  Gross per 24 hour   Intake 5245.82 ml   Output 2275 ml   Net 2970.82 ml          Exam:       Patient without distress.                CV: RRR                Resp: Diminished at bases, otherwise clear.                  Abdomen:soft, expected tenderness, hypoactive bowel sounds x4, wound dressing    intact with scant serosanguinous drainage                GU: scant brown drainage on peripad (d/t betadine soaked vaginal packing)     Lower extremities are nontender without edema. No cords    Labs:   Lab Results   Component Value Date/Time    WBC 12.3 (H) 11/30/2016 04:29 AM    WBC 10.6 11/29/2016 10:34 AM    WBC 13.1 (H) 11/14/2016 04:32 AM    WBC 11.4 (H) 11/13/2016 05:53 PM    WBC 7.8 11/05/2016 11:12 AM    WBC 8.9 09/17/2014 01:08 PM    WBC 8.2 04/13/2013 10:07 AM    WBC 8.7 07/17/2010 08:44 AM    WBC 10.0 12/12/2009 12:02 PM    HGB 8.9 (L) 11/30/2016 04:29 AM    HGB 10.6 (L) 11/29/2016 10:34 AM    HGB 10.1 (L) 11/14/2016 04:32 AM    HGB 11.6 11/13/2016 05:53 PM    HGB 12.6 11/05/2016 11:12 AM    HGB 13.6 09/17/2014 01:08 PM    HGB 13.5 04/13/2013 10:07 AM    HGB 12.8 07/17/2010 08:44 AM    HGB 13.6 12/12/2009 12:02 PM     HCT 28.1 (L) 11/30/2016 04:29 AM    HCT 33.8 (L) 11/29/2016 10:34 AM    HCT 31.9 (L) 11/14/2016 04:32 AM    HCT 36.6 11/13/2016 05:53 PM    HCT 39.4 11/05/2016 11:12 AM    HCT 41.0 09/17/2014 01:08 PM    HCT 41.6 04/13/2013 10:07 AM    HCT 40.0 07/17/2010 08:44 AM    HCT 42.0 12/12/2009 12:02 PM    PLATELET 407 (H) 11/30/2016 04:29 AM    PLATELET 550 (H) 11/29/2016 10:34 AM    PLATELET 354 11/14/2016 04:32 AM    PLATELET 329 11/13/2016 05:53 PM    PLATELET 334 11/05/2016 11:12 AM  PLATELET 326 09/17/2014 01:08 PM    PLATELET 321 04/13/2013 10:07 AM    PLATELET 387 07/17/2010 08:44 AM    PLATELET 326 12/12/2009 12:02 PM       Recent Results (from the past 24 hour(s))   CBC WITH AUTOMATED DIFF    Collection Time: 11/29/16 10:34 AM   Result Value Ref Range    WBC 10.6 3.6 - 11.0 K/uL    RBC 3.88 3.80 - 5.20 M/uL    HGB 10.6 (L) 11.5 - 16.0 g/dL    HCT 33.8 (L) 35.0 - 47.0 %    MCV 87.1 80.0 - 99.0 FL    MCH 27.3 26.0 - 34.0 PG    MCHC 31.4 30.0 - 36.5 g/dL    RDW 13.4 11.5 - 14.5 %    PLATELET 550 (H) 150 - 400 K/uL    MPV 10.6 8.9 - 12.9 FL    NRBC 0.0 0 PER 100 WBC    ABSOLUTE NRBC 0.00 0.00 - 0.01 K/uL    NEUTROPHILS 55 32 - 75 %    LYMPHOCYTES 30 12 - 49 %    MONOCYTES 5 5 - 13 %    EOSINOPHILS 8 (H) 0 - 7 %    BASOPHILS 1 0 - 1 %    IMMATURE GRANULOCYTES 2 (H) 0.0 - 0.5 %    ABS. NEUTROPHILS 5.9 1.8 - 8.0 K/UL    ABS. LYMPHOCYTES 3.2 0.8 - 3.5 K/UL    ABS. MONOCYTES 0.5 0.0 - 1.0 K/UL    ABS. EOSINOPHILS 0.8 (H) 0.0 - 0.4 K/UL    ABS. BASOPHILS 0.1 0.0 - 0.1 K/UL    ABS. IMM. GRANS. 0.2 (H) 0.00 - 0.04 K/UL    DF AUTOMATED     METABOLIC PANEL, COMPREHENSIVE    Collection Time: 11/29/16 10:34 AM   Result Value Ref Range    Sodium 141 136 - 145 mmol/L    Potassium 4.0 3.5 - 5.1 mmol/L    Chloride 110 (H) 97 - 108 mmol/L    CO2 21 21 - 32 mmol/L    Anion gap 10 5 - 15 mmol/L    Glucose 133 (H) 65 - 100 mg/dL    BUN 17 6 - 20 MG/DL    Creatinine 0.75 0.55 - 1.02 MG/DL    BUN/Creatinine ratio 23 (H) 12 - 20       GFR est AA >60 >60 ml/min/1.77m    GFR est non-AA >60 >60 ml/min/1.738m   Calcium 9.6 8.5 - 10.1 MG/DL    Bilirubin, total 0.2 0.2 - 1.0 MG/DL    ALT (SGPT) 25 12 - 78 U/L    AST (SGOT) 12 (L) 15 - 37 U/L    Alk. phosphatase 88 45 - 117 U/L    Protein, total 7.8 6.4 - 8.2 g/dL    Albumin 3.6 3.5 - 5.0 g/dL    Globulin 4.2 (H) 2.0 - 4.0 g/dL    A-G Ratio 0.9 (L) 1.1 - 2.2     LACTIC ACID    Collection Time: 11/29/16 10:34 AM   Result Value Ref Range    Lactic acid 1.8 0.4 - 2.0 MMOL/L   TYPE & SCREEN    Collection Time: 11/29/16 10:34 AM   Result Value Ref Range    Crossmatch Expiration 12/02/2016     ABO/Rh(D) O POSITIVE     Antibody screen NEG     Unit number [id number]   Blood component type RC LR,2  Unit division 00     Status of unit TRANSFUSED     Crossmatch result Compatible     Unit number [id number]     Blood component type RC LR     Unit division 00     Status of unit TRANSFUSED     Crossmatch result Compatible     Unit number [id number]     Blood component type RC LR     Unit division 00     Status of unit REL FROM [facility]     Crossmatch result Compatible     Unit number [id number]     Blood component type RC LR     Unit division 00     Status of unit REL FROM [facility]     Crossmatch result Compatible    PROTHROMBIN TIME + INR    Collection Time: 11/29/16  1:00 PM   Result Value Ref Range    INR 1.0 0.9 - 1.1      Prothrombin time 9.8 9.0 - 11.1 sec   PTT    Collection Time: 11/29/16  1:00 PM   Result Value Ref Range    aPTT 25.6 22.1 - 32.0 sec    aPTT, therapeutic range     58.0 - 77.0 SECS   FIBRINOGEN    Collection Time: 11/29/16  1:00 PM   Result Value Ref Range    Fibrinogen 400 200 - 475 mg/dL   POC HEMOGLOBIN    Collection Time: 11/29/16  2:19 PM   Result Value Ref Range    Hemoglobin (POC) 8.8 (L) 11.5 - 16.0 g/dL   POC HEMOGLOBIN    Collection Time: 11/29/16  2:22 PM   Result Value Ref Range    Hemoglobin (POC) 10.0 (L) 11.5 - 16.0 g/dL   POC HEMOGLOBIN     Collection Time: 11/29/16  2:26 PM   Result Value Ref Range    Hemoglobin (POC) 10.1 (L) 11.5 - 16.0 g/dL   CBC WITH AUTOMATED DIFF    Collection Time: 11/30/16  4:29 AM   Result Value Ref Range    WBC 12.3 (H) 3.6 - 11.0 K/uL    RBC 3.26 (L) 3.80 - 5.20 M/uL    HGB 8.9 (L) 11.5 - 16.0 g/dL    HCT 28.1 (L) 35.0 - 47.0 %    MCV 86.2 80.0 - 99.0 FL    MCH 27.3 26.0 - 34.0 PG    MCHC 31.7 30.0 - 36.5 g/dL    RDW 14.3 11.5 - 14.5 %    PLATELET 407 (H) 150 - 400 K/uL    MPV 10.8 8.9 - 12.9 FL    NRBC 0.0 0 PER 100 WBC    ABSOLUTE NRBC 0.00 0.00 - 0.01 K/uL    NEUTROPHILS 84 (H) 32 - 75 %    BAND NEUTROPHILS 1 0 - 6 %    LYMPHOCYTES 11 (L) 12 - 49 %    MONOCYTES 3 (L) 5 - 13 %    EOSINOPHILS 1 0 - 7 %    BASOPHILS 0 0 - 1 %    IMMATURE GRANULOCYTES 0 %    ABS. NEUTROPHILS 10.4 (H) 1.8 - 8.0 K/UL    ABS. LYMPHOCYTES 1.4 0.8 - 3.5 K/UL    ABS. MONOCYTES 0.4 0.0 - 1.0 K/UL    ABS. EOSINOPHILS 0.1 0.0 - 0.4 K/UL    ABS. BASOPHILS 0.0 0.0 - 0.1 K/UL    ABS. IMM. GRANS. 0.0 K/UL    DF  MANUAL      PLATELET COMMENTS Large Platelets      RBC COMMENTS ANISOCYTOSIS  1+        RBC COMMENTS POLYCHROMASIA  1+        RBC COMMENTS OVALOCYTES  PRESENT       METABOLIC PANEL, BASIC    Collection Time: 11/30/16  4:29 AM   Result Value Ref Range    Sodium 139 136 - 145 mmol/L    Potassium 4.4 3.5 - 5.1 mmol/L    Chloride 107 97 - 108 mmol/L    CO2 23 21 - 32 mmol/L    Anion gap 9 5 - 15 mmol/L    Glucose 114 (H) 65 - 100 mg/dL    BUN 12 6 - 20 MG/DL    Creatinine 0.66 0.55 - 1.02 MG/DL    BUN/Creatinine ratio 18 12 - 20      GFR est AA >60 >60 ml/min/1.40m    GFR est non-AA >60 >60 ml/min/1.78m   Calcium 8.6 8.5 - 10.1 MG/DL           Assessment: : S/p TLH/BSO 11/6 with immediate post op course c/b return to OR to repair vaginal laceration, now POD 1 s/p pelvic EUA and exploratory laparotomy for pelvic hematoma and heavy vaginal bleeding.  Reviewed events of surgery with the patient and plan for recovery.  Will keep  vaginal packing, and therefore foley catheter, in until AM 11/24 per gyn-onc recommendations.  ACHS BG checks and cover with SSI.  Encourage ambulation, maintain SCDs while in bed.  Start lovenox ppx tomorrow if H/H stable.    Signed By: AlMarvetta GibbonsDO     November 30, 2016

## 2016-11-30 NOTE — Progress Notes (Signed)
[facility], [place] [address]  P [phone]   F [phone]       Patient Name: Katrina [name]   Admit Date: 11/29/2016   OR Date: 11/29/2016       Visit Date: 11/30/2016        SUBJECTIVE:    POD#1 s/p EUA, exploratory laparotomy secondary to pelvic hematoma and vaginal bleeding. S/p TLH/BSO on 13/0/86 complicated by vaginal lacerations that required EUA and repair on the same day.     Doing well today. Pain controlled. Vaginal packing in place. No blood on pad. Foley in place. Denies nausea, vomiting, fevers, or chills. Denies CP, SOB. She has been up once to walk around. Denies lightheadedness or dizziness.    OBJECTIVE:    Patient Vitals for the past 24 hrs:   Temp Pulse Resp BP SpO2   11/30/16 0415 98.3 ??F (36.8 ??C) 74 16 123/74 100 %   11/30/16 0213 98.3 ??F (36.8 ??C) 71 16 112/69 98 %   11/29/16 2352 98.6 ??F (37 ??C) 72 14 105/57 97 %   11/29/16 2139 98.8 ??F (37.1 ??C) 80 14 106/68 98 %   11/29/16 2032 99.2 ??F (37.3 ??C) 76 14 110/71 99 %   11/29/16 1933 99.1 ??F (37.3 ??C) 79 12 113/72 97 %   11/29/16 1826 99.3 ??F (37.4 ??C) 86 12 116/76 98 %   11/29/16 1805 ??? 84 11 ??? 100 %   11/29/16 1800 ??? 85 14 117/66 98 %   11/29/16 1745 ??? 84 12 119/65 100 %   11/29/16 1730 ??? 85 14 115/69 99 %   11/29/16 1725 ??? 86 14 ??? 98 %   11/29/16 1715 ??? 86 14 113/61 98 %   11/29/16 1700 ??? 87 13 111/62 100 %   11/29/16 1640 98.8 ??F (37.1 ??C) 95 16 140/79 100 %   11/29/16 1638 98.8 ??F (37.1 ??C) ??? ??? 140/79 ???   11/29/16 1305 ??? 87 15 132/73 100 %   11/29/16 1300 ??? 86 15 (!) 135/104 95 %   11/29/16 1255 ??? 86 14 137/73 96 %   11/29/16 1245 ??? 83 12 132/69 92 %   11/29/16 1240 98.4 ??F (36.9 ??C) 85 14 136/68 95 %   11/29/16 1235 ??? 87 12 133/79 97 %   11/29/16 1230 ??? 85 12 135/68 99 %   11/29/16 1227 98.4 ??F (36.9 ??C) ??? 18 137/79 100 %   11/29/16 1221 ??? 85 15 140/71 100 %   11/29/16 1220 ??? 87 9 127/82 99 %   11/29/16 1215 98.4 ??F (36.9 ??C) 87 10 137/65 95 %    11/29/16 1145 ??? ??? ??? 142/77 92 %   11/29/16 1032 ??? ??? ??? 186/79 ???   11/29/16 1027 98.7 ??F (37.1 ??C) (!) 109 18 ??? 100 %   11/29/16 1021 ??? (!) 130 18 ??? 97 %       Date 11/29/16 0700 - 11/30/16 0659 11/30/16 0700 - 12/01/16 0659   Shift 0700-1859 1900-0659 24 Hour Total 0700-1859 1900-0659 24 Hour Total   INTAKE   I.V.(mL/kg/hr) 2572.9(2.4) 1187.5(1.1) 3760.4(1.8)        Volume (0.9% sodium chloride infusion) 1500  1500        Volume (lactated Ringers infusion) 500  500        Volume (lactated Ringers infusion) 572.9 1187.5 1760.4      Blood 1485.4  1485.4  Volume (TRANSFUSE PACKED RBC'S) 772.9  772.9        Volume (TRANSFUSE PACKED RBC'S) 712.5  712.5      Shift Total(mL/kg) 4058.3(45.9) 1187.5(13.4) 5245.8(59.3)      OUTPUT   Urine(mL/kg/hr) 450(0.4) 1725(1.6) 2175(1)        Urine Output 250  250        Urine Output (mL) (Urinary Catheter 11/29/16 2- way;Foley) 200 1725 1925      Blood 100  100        Estimated Blood Loss 100  100      Shift Total(mL/kg) 550(6.2) 1725(19.5) 2275(25.7)      NET 3508.3 -537.5 2970.8      Weight (kg) 88.5 88.5 88.5 88.5 88.5 88.5       Physical Exam     General:  alert, no distress     Cardiac:  Regular rate and rhythm        Lungs:  clear to auscultation bilaterally  Abdomen:  soft, non-tender, without masses or organomegaly, normal bowel sounds       Wound:  The dressing has serosanguinous stains.   Extremity: extremities normal, atraumatic, no cyanosis or edema    Date Review  Lab Results   Component Value Date/Time    WBC 12.3 (H) 11/30/2016 04:29 AM    ABS. NEUTROPHILS 10.4 (H) 11/30/2016 04:29 AM    HGB 8.9 (L) 11/30/2016 04:29 AM    HCT 28.1 (L) 11/30/2016 04:29 AM    MCV 86.2 11/30/2016 04:29 AM    MCH 27.3 11/30/2016 04:29 AM    PLATELET 407 (H) 11/30/2016 04:29 AM     Lab Results   Component Value Date/Time    Sodium 139 11/30/2016 04:29 AM    Potassium 4.4 11/30/2016 04:29 AM    Chloride 107 11/30/2016 04:29 AM    CO2 23 11/30/2016 04:29 AM     Glucose 114 (H) 11/30/2016 04:29 AM    BUN 12 11/30/2016 04:29 AM    Creatinine 0.66 11/30/2016 04:29 AM    Calcium 8.6 11/30/2016 04:29 AM    Albumin 3.6 11/29/2016 10:34 AM    Bilirubin, total 0.2 11/29/2016 10:34 AM    AST (SGOT) 12 (L) 11/29/2016 10:34 AM    ALT (SGPT) 25 11/29/2016 10:34 AM    Alk. phosphatase 88 11/29/2016 10:34 AM     IMPRESSION/PLAN:    1 Day Post-Op Procedure(s):  LAPAROTOMY EXPLORATORY, PELVIC EXAM UNDER ANESTHESIA, PROCTOSCOPY for Vaginal Bleeding    Oncologic:  S/p TLH/BSO with Dr. Curt Bears on 11/13/16 for benign fibroids, which was complicated by vaginal lacerations requiring pair the same day. Now POD 1 s/p EUA and exploratory laparotomy (11/29/16. Doing well post-operatively. See full op notes for details   Heme/CV:  s/p 2 units of PRBC yesterday. Her vitals are appropriate this morning, as are her labs. Will continue to trend Hgb/Hct daily. Vaginal packing to remain in place.   Renal:  Foley to remain in place while vaginal packing in place.        FEN/GI:  ADAT. Discontinue IVFs once tolerating oral intake   ID/Wound:  serosanguineous drainage on wound dressing. Will replace wound dressing today.    PPX:  SCDs. Once Hgb/Hct stable, would recommend Lovenox while inpatient.    Dispostion:  Doing well postoperatively. Defer management of DM to primary team. Gyn Onc will continue to follow daily. Thank you for allowing [place] to participate in this patient's care. Please do not hesitate to contact our team with any questions  or concerns.         Philemon Kingdom, MD

## 2016-11-30 NOTE — Progress Notes (Addendum)
Bedside and Verbal shift change report given to [place]  (oncoming nurse) by Gust Brooms RN  (offgoing nurse). Report included the following information SBAR, Kardex, OR Summary, Procedure Summary, Intake/Output, MAR and Recent Results.     2046: Pt assessed and comfortable. Pain 4/10 sore pain, but patient states she's okay. Incentive used and pt reached 1250. 500 ccs of clear yellow urine is emptied from foley. No vaginal bleeding noted, pt states she has passed gas numerous times today.  7026: Pt c/o nausea. zofran is given  0712: Pt has been asleep since given zofran no longer nauseous. Blood sugar taken and morning medications given.

## 2016-11-30 NOTE — Progress Notes (Signed)
Visited with pt and sister.  No cos right now. Pain controlled with percocet and has been walking on the unit.  VSS  Reviewed labs.   Reviewed plan of care to advance diet, d/c vag pack and Foley tomorrow.  Upon d/c will f/u with me in office for staple removal in 7-10 days.

## 2016-12-01 LAB — CBC W/O DIFF
ABSOLUTE NRBC: 0 10*3/uL (ref 0.00–0.01)
HCT: 24.9 % — ABNORMAL LOW (ref 35.0–47.0)
HGB: 8 g/dL — ABNORMAL LOW (ref 11.5–16.0)
MCH: 27.7 PG (ref 26.0–34.0)
MCHC: 32.1 g/dL (ref 30.0–36.5)
MCV: 86.2 FL (ref 80.0–99.0)
MPV: 10.7 FL (ref 8.9–12.9)
NRBC: 0 PER 100 WBC
PLATELET: 339 10*3/uL (ref 150–400)
RBC: 2.89 M/uL — ABNORMAL LOW (ref 3.80–5.20)
RDW: 14.5 % (ref 11.5–14.5)
WBC: 10.1 10*3/uL (ref 3.6–11.0)

## 2016-12-01 LAB — GLUCOSE, POC
Glucose (POC): 119 mg/dL — ABNORMAL HIGH (ref 65–100)
Glucose (POC): 116 mg/dL — ABNORMAL HIGH (ref 65–100)
Glucose (POC): 122 mg/dL — ABNORMAL HIGH (ref 65–100)

## 2016-12-01 LAB — METABOLIC PANEL, BASIC
Anion gap: 7 mmol/L (ref 5–15)
BUN/Creatinine ratio: 18 (ref 12–20)
BUN: 10 MG/DL (ref 6–20)
CO2: 25 mmol/L (ref 21–32)
Calcium: 8.6 MG/DL (ref 8.5–10.1)
Chloride: 111 mmol/L — ABNORMAL HIGH (ref 97–108)
Creatinine: 0.57 MG/DL (ref 0.55–1.02)
GFR est AA: >60 mL/min/{1.73_m2} (ref >60)
GFR est non-AA: >60 mL/min/{1.73_m2} (ref >60)
Glucose: 104 mg/dL — ABNORMAL HIGH (ref 65–100)
Potassium: 3.8 mmol/L (ref 3.5–5.1)
Sodium: 143 mmol/L (ref 136–145)

## 2016-12-01 LAB — HGB & HCT
HCT: 24.4 % — ABNORMAL LOW (ref 35.0–47.0)
HGB: 7.8 g/dL — ABNORMAL LOW (ref 11.5–16.0)

## 2016-12-01 MED FILL — ONDANSETRON (PF) 4 MG/2 ML INJECTION: 4 mg/2 mL | INTRAMUSCULAR | Qty: 2

## 2016-12-01 MED FILL — NORMAL SALINE FLUSH 0.9 % INJECTION SYRINGE: INTRAMUSCULAR | Qty: 10

## 2016-12-01 MED FILL — OXYCODONE-ACETAMINOPHEN 5 MG-325 MG TAB: 5-325 mg | ORAL | Qty: 1

## 2016-12-01 MED FILL — IBUPROFEN 400 MG TAB: 400 mg | ORAL | Qty: 2

## 2016-12-01 MED FILL — FAMOTIDINE 20 MG TAB: 20 mg | ORAL | Qty: 1

## 2016-12-01 MED FILL — NORMAL SALINE FLUSH 0.9 % INJECTION SYRINGE: INTRAMUSCULAR | Qty: 20

## 2016-12-01 MED FILL — OXYCODONE-ACETAMINOPHEN 5 MG-325 MG TAB: 5-325 mg | ORAL | Qty: 2

## 2016-12-01 MED FILL — LEVOXYL 100 MCG TABLET: 100 mcg | ORAL | Qty: 1

## 2016-12-01 MED FILL — DOK 100 MG CAPSULE: 100 mg | ORAL | Qty: 1

## 2016-12-01 MED FILL — ENOXAPARIN 40 MG/0.4 ML SUB-Q SYRINGE: 40 mg/0.4 mL | SUBCUTANEOUS | Qty: 0.4

## 2016-12-01 NOTE — Progress Notes (Signed)
Problem: Falls - Risk of  Goal: *Absence of Falls  Document Schmid Fall Risk and appropriate interventions in the flowsheet.  Outcome: Progressing Towards Goal  Fall Risk Interventions:  Mobility Interventions: Assess mobility with egress test         Medication Interventions: Patient to call before getting OOB    Elimination Interventions: Call light in reach             Problem: Pressure Injury - Risk of  Goal: *Prevention of pressure injury  Document Braden Scale and appropriate interventions in the flowsheet.  Outcome: Progressing Towards Goal  Pressure Injury Interventions:            Activity Interventions: Increase time out of bed    Mobility Interventions: Pressure redistribution bed/mattress (bed type)    Nutrition Interventions: Document food/fluid/supplement intake

## 2016-12-01 NOTE — Progress Notes (Signed)
Gynecology Progress Note    Patient doing well post-op day 1 from Procedure(s):  LAPAROTOMY EXPLORATORY, PELVIC EXAM UNDER ANESTHESIA, PROCTOSCOPY without significant complaints.  Pain controlled on current medication. Patient is passing flatus. Foley catheter in place draining clear yellow urine. Vaginal packing in place with mild amount of dried brown blood on pad from overnight. Tolerating diet without nausea or vomiting. Denies fever/chills, CP, SOB, or leg pain bilaterally.     Vitals:  Blood pressure 145/82, pulse 85, temperature 98.5 ??F (36.9 ??C), resp. rate 16, height 5' 5.5" (1.664 m), weight 88.5 kg (195 lb), last menstrual period 11/01/2016, SpO2 97 %.  Temp (24hrs), Avg:98.6 ??F (37 ??C), Min:98.2 ??F (36.8 ??C), Max:98.8 ??F (37.1 ??C)        Exam:  Patient without distress.               Abdomen soft,  nontender.                 Incision covered with clean and dry dressing; no erythema noted - pt reports dressing was changed earlier today and there were no issues noted with incision               Lower extremities are negative for cords or tenderness, trace edema in LE bilaterally    Lab/Data Review:  All lab results for the last 24 hours reviewed. Hgb stable at 8.0    Assessment and Plan:  Patient appears to be having uncomplicated post procedure course s/p:  LAPAROTOMY EXPLORATORY, PELVIC EXAM UNDER ANESTHESIA, PROCTOSCOPY. Continue routine post-op care. Vaginal packing removed by Dr. Drue Flirt this afternoon with hemostasis appreciated vaginally. Plan to maintain foley catheter today through tonight with removal in morning. Anticipate discharge home tomorrow. Plan discussed with patient, her sister and nurse.     Rolm Baptise, DO  12/01/16

## 2016-12-01 NOTE — Progress Notes (Addendum)
Bedside and Verbal shift change report given to C.Blunt, RN (oncoming nurse) by Marko Plume, RN (offgoing nurse). Report included the following information SBAR, Intake/Output, MAR and Recent Results.     69  Dr. Marrion Coy at bedside.  Patient having small amount of bleeding when Dr. Marrion Coy trying to remove vaginal packing.      [place] [facility] [place]  Dr. Marrion Coy and Dr. Drue Flirt at bedside.  Dr. Drue Flirt able to remove vaginal packing.  No active bleeding noted.  Foley to be left in until the am.  Patient tolerated procedure well.      1230  Small amount of breakthrough bleeding noted on abd dressing - small amt of bleeding noted between 2 staples.  Will observe.    1245  No new bleeding noted at incision site.

## 2016-12-01 NOTE — Progress Notes (Signed)
[place], [place] [address]  P [phone]   F [phone]       Patient Name: Katrina [name]   Admit Date: 11/29/2016   OR Date: 11/29/2016       Visit Date: 12/01/2016        SUBJECTIVE:    POD#1 s/p EUA, exploratory laparotomy secondary to pelvic hematoma and vaginal bleeding. S/p TLH/BSO on 64/4/03 complicated by vaginal lacerations that required EUA and repair on the same day.     Doing well today. Pain controlled. Vaginal packing in place with no blood on pad. Foley in place with good UOP. Denies nausea, vomiting, fevers, or chills. Denies CP, SOB. She is ambulating and tolerating oral intake without issues. Denies lightheadedness or dizziness.    OBJECTIVE:    Patient Vitals for the past 24 hrs:   Temp Pulse Resp BP SpO2   12/01/16 0430 98.5 ??F (36.9 ??C) 85 16 145/82 97 %   11/30/16 2326 98.7 ??F (37.1 ??C) 82 16 119/73 94 %   11/30/16 1959 98.8 ??F (37.1 ??C) 84 16 129/70 97 %   11/30/16 1607 98.2 ??F (36.8 ??C) 87 16 126/58 97 %   11/30/16 1241 98.4 ??F (36.9 ??C) 80 16 119/75 97 %   11/30/16 0809 98.1 ??F (36.7 ??C) 77 16 127/76 98 %       Date 11/30/16 0700 - 12/01/16 0659 12/01/16 0700 - 12/02/16 0659   Shift 0700-1859 1900-0659 24 Hour Total 0700-1859 4742-5956 24 Hour Total   INTAKE   P.O. 500 250 750        P.O. 500 250 750      I.V.(mL/kg/hr) 1545.8(1.5) 1091.7(1) 2637.5(1.2) 341.7  341.7     Volume (lactated Ringers infusion) 1545.8 1091.7 2637.5 341.7  341.7   Shift Total(mL/kg) 2045.8(23.1) 1341.7(15.2) 3387.5(38.3) 341.7(3.9)  341.7(3.9)   OUTPUT   Urine(mL/kg/hr) 3675(3.5) 1700(1.6) 5375(2.5)        Urine Output (mL) (Urinary Catheter 11/29/16 2- way;Foley) 3675 1700 5375      Shift Total(mL/kg) 3675(41.5) 1700(19.2) 5375(60.8)      NET -1629.2 -358.3 -1987.5 341.7  341.7   Weight (kg) 88.5 88.5 88.5 88.5 88.5 88.5       Physical Exam     General:  alert, no distress     Cardiac:  Regular rate and rhythm         Lungs:  clear to auscultation bilaterally  Abdomen:  soft, non-tender, without masses or organomegaly, normal bowel sounds       Wound: Dressing is off this morning. Staples in place. C/D/I   Extremity: extremities normal, atraumatic, no cyanosis or edema  Date Review  Lab Results   Component Value Date/Time    WBC 10.1 12/01/2016 04:39 AM    ABS. NEUTROPHILS 10.4 (H) 11/30/2016 04:29 AM    HGB 7.8 (L) 12/01/2016 04:39 AM    HGB 8.0 (L) 12/01/2016 04:39 AM    HCT 24.4 (L) 12/01/2016 04:39 AM    HCT 24.9 (L) 12/01/2016 04:39 AM    MCV 86.2 12/01/2016 04:39 AM    MCH 27.7 12/01/2016 04:39 AM    PLATELET 339 12/01/2016 04:39 AM     Lab Results   Component Value Date/Time    Sodium 143 12/01/2016 04:39 AM    Potassium 3.8 12/01/2016 04:39 AM    Chloride 111 (H) 12/01/2016 04:39 AM    CO2 25 12/01/2016 04:39 AM    Glucose 104 (H)  12/01/2016 04:39 AM    BUN 10 12/01/2016 04:39 AM    Creatinine 0.57 12/01/2016 04:39 AM    Calcium 8.6 12/01/2016 04:39 AM    Albumin 3.6 11/29/2016 10:34 AM    Bilirubin, total 0.2 11/29/2016 10:34 AM    AST (SGOT) 12 (L) 11/29/2016 10:34 AM    ALT (SGPT) 25 11/29/2016 10:34 AM    Alk. phosphatase 88 11/29/2016 10:34 AM     IMPRESSION/PLAN:    1 Day Post-Op Procedure(s):  LAPAROTOMY EXPLORATORY, PELVIC EXAM UNDER ANESTHESIA, PROCTOSCOPY for Vaginal Bleeding    Oncologic:  S/p TLH/BSO with Dr. Curt Bears on 11/13/16 for benign fibroids, which was complicated by vaginal lacerations requiring pair the same day. Now POD 1 s/p EUA and exploratory laparotomy (11/29/16). Doing well post-operatively. See full op notes for details   Heme/CV:  s/p 2 units of PRBC on 11/29/16 prior to surgery. Her vitals are appropriate this morning, as are her labs. Hgb stable at 8.0. I expect the trend from 8.9 to 8.0 is secondary to continued IVF hydration and equilibration. Will continue to trend daily. Plan likely removal of vaginal packing today by primary team. Will keep foley in place after  removal for a few hours just to monitor for any further vaginal bleeding.    Renal:  Foley to remain in place while vaginal packing in place.        FEN/GI:  ADAT. Discontinued IVFs.   ID/Wound:  C/D/I with staples.   PPX:  SCDs. Once Hgb/Hct stable, would recommend Lovenox while inpatient.    Dispostion:  Doing well postoperatively. Defer management of DM to primary team. Gyn Onc will continue to follow daily. As per above, vaginal packing to be removed today. Hgb stable at 8.0. Would monitor for 24 hours after removal of vaginal packing. Discontinued IVFs as patient is now tolerating oral intake. Anticipate discharge in 1-2 days after removal of vaginal packing. Thank you for allowing [place] to participate in this patient's care. Please do not hesitate to contact our team with any questions or concerns.         Philemon Kingdom, MD

## 2016-12-01 NOTE — Progress Notes (Addendum)
Bedside and Verbal shift change report given to [place]  (oncoming nurse) by Alyce Pagan  (offgoing nurse). Report included the following information SBAR, Kardex, OR Summary, Procedure Summary, Intake/Output, MAR and Recent Results.   1954: Pt assessed, is comfortable. Pain is 4/10, managed with the percocet. Denies SOB, N/V. Scant pink vaginal bleeding noted. Scant sanguinous fluid noted on dressing.   2255: Pt requests pain medications  0000: Pt is asleep  0411: Pt is asleep  0635: Foley removed and pt instructed to call out when she feels the urge to void. Pain 4/10, pt states it's just sore and is probably gas pains too

## 2016-12-02 LAB — CBC W/O DIFF
ABSOLUTE NRBC: 0 10*3/uL (ref 0.00–0.01)
HCT: 24.4 % — ABNORMAL LOW (ref 35.0–47.0)
HGB: 7.6 g/dL — ABNORMAL LOW (ref 11.5–16.0)
MCH: 27.2 PG (ref 26.0–34.0)
MCHC: 31.1 g/dL (ref 30.0–36.5)
MCV: 87.5 FL (ref 80.0–99.0)
MPV: 10.6 FL (ref 8.9–12.9)
NRBC: 0 PER 100 WBC
PLATELET: 370 10*3/uL (ref 150–400)
RBC: 2.79 M/uL — ABNORMAL LOW (ref 3.80–5.20)
RDW: 14.3 % (ref 11.5–14.5)
WBC: 9.1 10*3/uL (ref 3.6–11.0)

## 2016-12-02 LAB — GLUCOSE, POC
Glucose (POC): 126 mg/dL — ABNORMAL HIGH (ref 65–100)
Glucose (POC): 99 mg/dL (ref 65–100)

## 2016-12-02 LAB — METABOLIC PANEL, BASIC
Anion gap: 7 mmol/L (ref 5–15)
BUN/Creatinine ratio: 13 (ref 12–20)
BUN: 8 MG/DL (ref 6–20)
CO2: 28 mmol/L (ref 21–32)
Calcium: 8.7 MG/DL (ref 8.5–10.1)
Chloride: 108 mmol/L (ref 97–108)
Creatinine: 0.62 MG/DL (ref 0.55–1.02)
GFR est AA: >60 mL/min/{1.73_m2} (ref >60)
GFR est non-AA: >60 mL/min/{1.73_m2} (ref >60)
Glucose: 90 mg/dL (ref 65–100)
Potassium: 3.8 mmol/L (ref 3.5–5.1)
Sodium: 143 mmol/L (ref 136–145)

## 2016-12-02 MED ORDER — OXYCODONE-ACETAMINOPHEN 5 MG-325 MG TAB
5-325 mg | ORAL_TABLET | ORAL | 0 refills | Status: AC | PRN
Start: 2016-12-02 — End: ?

## 2016-12-02 MED FILL — OXYCODONE-ACETAMINOPHEN 5 MG-325 MG TAB: 5-325 mg | ORAL | Qty: 2

## 2016-12-02 MED FILL — ENOXAPARIN 40 MG/0.4 ML SUB-Q SYRINGE: 40 mg/0.4 mL | SUBCUTANEOUS | Qty: 0.4

## 2016-12-02 MED FILL — NORMAL SALINE FLUSH 0.9 % INJECTION SYRINGE: INTRAMUSCULAR | Qty: 20

## 2016-12-02 MED FILL — LEVOXYL 100 MCG TABLET: 100 mcg | ORAL | Qty: 1

## 2016-12-02 MED FILL — IBUPROFEN 400 MG TAB: 400 mg | ORAL | Qty: 2

## 2016-12-02 MED FILL — FAMOTIDINE 20 MG TAB: 20 mg | ORAL | Qty: 1

## 2016-12-02 MED FILL — DOK 100 MG CAPSULE: 100 mg | ORAL | Qty: 1

## 2016-12-02 NOTE — Discharge Summary (Signed)
Discharge Summary     Name: Katrina [name] MRN: [id number]  SSN: [id number]    Date of Birth: [birth year]  Age: 51 y.o.  Sex: female      Allergies: Aldactone [spironolactone]; Codeine; and Paxil [paroxetine hcl]    Admit Date: 11/29/2016    Discharge Date: 12/02/2016      Admitting Physician: Maylon Cos, MD     * Admission Diagnoses: acute hemorrhage s/p prior [facility]    * Discharge Diagnoses:   Hospital Problems as of 12/02/2016 Date Reviewed: 12-05-16          Codes Class Noted - Resolved POA    Vaginal bleeding ICD-10-CM: N93.9  ICD-9-CM: 623.8  11/29/2016 - Present Unknown               * Procedures: EUA, exlap and control of hemostasis, proctoscopy, loa    * Discharge Condition: Good    [facility] Course: Normal hospital course for this procedure.  Required transfusion due to acute blood loss anemia.    Significant Diagnostic Studies:   Recent Results (from the past 24 hour(s))   GLUCOSE, POC    Collection Time: 12/01/16  5:19 PM   Result Value Ref Range    Glucose (POC) 122 (H) 65 - 100 mg/dL    Performed by Leanne Chang    GLUCOSE, POC    Collection Time: 12/01/16  9:55 PM   Result Value Ref Range    Glucose (POC) 126 (H) 65 - 100 mg/dL    Performed by [facility] KAYLA    CBC W/O DIFF    Collection Time: 12/02/16  6:19 AM   Result Value Ref Range    WBC 9.1 3.6 - 11.0 K/uL    RBC 2.79 (L) 3.80 - 5.20 M/uL    HGB 7.6 (L) 11.5 - 16.0 g/dL    HCT 24.4 (L) 35.0 - 47.0 %    MCV 87.5 80.0 - 99.0 FL    MCH 27.2 26.0 - 34.0 PG    MCHC 31.1 30.0 - 36.5 g/dL    RDW 14.3 11.5 - 14.5 %    PLATELET 370 150 - 400 K/uL    MPV 10.6 8.9 - 12.9 FL    NRBC 0.0 0 PER 100 WBC    ABSOLUTE NRBC 0.00 0.00 - 7.56 K/uL   METABOLIC PANEL, BASIC    Collection Time: 12/02/16  6:19 AM   Result Value Ref Range    Sodium 143 136 - 145 mmol/L    Potassium 3.8 3.5 - 5.1 mmol/L    Chloride 108 97 - 108 mmol/L    CO2 28 21 - 32 mmol/L    Anion gap 7 5 - 15 mmol/L    Glucose 90 65 - 100 mg/dL    BUN 8 6 - 20 MG/DL    Creatinine 0.62 0.55 -  1.02 MG/DL    BUN/Creatinine ratio 13 12 - 20      GFR est AA >60 >60 ml/min/1.72m2    GFR est non-AA >60 >60 ml/min/1.34m2    Calcium 8.7 8.5 - 10.1 MG/DL   GLUCOSE, POC    Collection Time: 12/02/16  6:22 AM   Result Value Ref Range    Glucose (POC) 99 65 - 100 mg/dL    Performed by Bubba Camp        * Disposition: stable    Discharge Medications:   Current Discharge Medication List      START taking these medications    Details   !!  oxyCODONE-acetaminophen (PERCOCET) 5-325 mg per tablet Take 1-2 Tabs by mouth every four (4) hours as needed. Max Daily Amount: 12 Tabs.  Qty: 20 Tab, Refills: 0    Associated Diagnoses: Vaginal bleeding       !! - Potential duplicate medications found. Please discuss with provider.      CONTINUE these medications which have NOT CHANGED    Details   !! oxyCODONE-acetaminophen (PERCOCET) 5-325 mg per tablet Take 1-2 Tabs by mouth every four (4) hours as needed. Max Daily Amount: 12 Tabs.  Qty: 30 Tab, Refills: 0    Associated Diagnoses: Postoperative pain      ibuprofen (MOTRIN) 800 mg tablet Take 1 Tab by mouth every eight (8) hours as needed for Pain.  Qty: 30 Tab, Refills: 0      cinnamon bark (CINNAMON) 500 mg cap Take 1 Tab by mouth daily.      OTHER 1 Tab daily. BERBERINE 1, 000 MCG      SOY ISOFLA/BLK COHOSH/MAG BARK (ESTROVEN PO) Take 1 Tab by mouth daily.      ASHWAGANDHA ROOT EXTRACT,BULK, 2 Tabs by Does Not Apply route daily.      cholecalciferol, VITAMIN D3, (VITAMIN D3) 5,000 unit tab tablet Take 5,000 Units by mouth daily.    Associated Diagnoses: Vitamin D deficiency      cyanocobalamin (VITAMIN B-12) 1,000 mcg tablet Take 1,000 mcg by mouth daily.    Associated Diagnoses: Essential hypertension      Omega-3 Fatty Acids (FISH OIL) 500 mg cap Take 1 Tab by mouth daily.    Associated Diagnoses: Dyslipidemia      aspirin delayed-release 81 mg tablet Take 1 Tab by mouth daily. Indications: prevention of transient ischemic attack  Qty: 90 Tab, Refills: 3    Associated  Diagnoses: Type 2 diabetes, diet controlled (HCC)      levothyroxine (SYNTHROID) 100 mcg tablet Take 1 Tab by mouth Daily (before breakfast). Indications: hypothyroidism  Qty: 26 Tab, Refills: 1    Associated Diagnoses: Hypothyroidism due to acquired atrophy of thyroid      raNITIdine (ZANTAC) 150 mg tablet Take 150 mg by mouth two (2) times a day.      metFORMIN ER (GLUCOPHAGE XR) 500 mg tablet TAKE 2 TABLETS BEFORE BREAKFAST AND DINNER FOR TYPE 2 DIABETES MELLITUS  Qty: 360 Tab, Refills: 3      albuterol (PROVENTIL HFA, VENTOLIN HFA, PROAIR HFA) 90 mcg/actuation inhaler Take 2 Puffs by inhalation every four (4) hours as needed for Shortness of Breath. Indications: BRONCHOSPASM PREVENTION  Qty: 1 Inhaler, Refills: 5    Associated Diagnoses: Mild intermittent asthma without complication      b complex vitamins tablet Take 1 Tab by mouth daily.        ascorbic acid (VITAMIN C) 500 mg tablet Take 1,000 mg by mouth daily.      vitamin E (AQUA GEMS) 400 unit capsule Take 400 Units by mouth daily.      EVENING PRIMROSE OIL (EVENING PRIMROSE PO) Take 1 Tab by mouth two (2) times a day.       !! - Potential duplicate medications found. Please discuss with provider.           * Follow-up Care/Patient Instructions:  Activity: No sex, douching, or tampons for 6 weeks or as directed by your physician. No heavy lifting for 6 weeks. No driving while taking pain medication.  Diet: Resume pre-hospital diet  Wound Care: Keep wound clean and dry    Follow-up  Information     Follow up With Specialties Details Why Contact Info    Gerre Pebbles, MD Obstetrics & Gynecology, Gynecology, Obstetrics Call in 1 week For suture removal [place] [address]  [phone]             Signed By:  Memory Argue, MD     December 02, 2016

## 2016-12-02 NOTE — Progress Notes (Addendum)
Bedside and Verbal shift change report given to C.Blunt,RN (oncoming nurse) by Marko Plume, RN (offgoing nurse). Report included the following information SBAR, MAR and Recent Results.     1323  Discharge instructions given and reviewed with patient.  All questions answered.  Rx given.  Dad and sister here to take patient home.

## 2016-12-02 NOTE — Progress Notes (Signed)
Problem: Falls - Risk of  Goal: *Absence of Falls  Document Schmid Fall Risk and appropriate interventions in the flowsheet.  Outcome: Progressing Towards Goal  Fall Risk Interventions:  Mobility Interventions: Assess mobility with egress test         Medication Interventions: Teach patient to arise slowly    Elimination Interventions: Call light in reach

## 2016-12-02 NOTE — Progress Notes (Signed)
Urology Progress Note    Subjective:     Daily Progress Note: 12/02/2016 12:23 PM    Tarkio is 3 Days Post-Op and doing well. She reports pain is well controlled. She has no complaints. She is tolerating a solid diet and ambulating without assistance. Patient is voiding without difficulty.    Objective:     Visit Vitals  BP 119/74 (BP 1 Location: Right arm, BP Patient Position: At rest;Supine)   Pulse 78   Temp 98.1 ??F (36.7 ??C)   Resp 16   Ht 5' 5.5" (1.664 m)   Wt 88.5 kg (195 lb)   SpO2 97%   BMI 31.96 kg/m??        Temp (24hrs), Avg:98.4 ??F (36.9 ??C), Min:98.1 ??F (36.7 ??C), Max:98.6 ??F (37 ??C)      Intake and Output:  11/23 1901 - 11/25 0700  In: 5308.3 [P.O.:3500; I.V.:1808.3]  Out: 6000 [Urine:6000]  11/25 0701 - 11/25 1900  In: -   Out: 600 [Urine:600]    Physical Exam:   General appearance: alert, cooperative, no distress, appears stated age  Abdomen: soft, non-tender. Bowel sounds normal. No masses,  no organomegaly  Extremities: Normal    Incision: clean, dry and no drainage    Lab/Data Review:  No labs    Assessment/Plan:     Active Problems:    Vaginal bleeding (11/29/2016)        Status Post:  Procedure(s):  LAPAROTOMY EXPLORATORY, PELVIC EXAM UNDER ANESTHESIA, PROCTOSCOPY     Plan:  POD#3 s/p EUA/exlap due to hematoma and bleeding. Has done well postoperatively.  Plan for d/c home.    Signed By: Memory Argue, MD                         December 02, 2016

## 2016-12-02 NOTE — Discharge Summary (Signed)
Discharge Summary     Name: Katrina [name] MRN: [id number]  SSN: [id number]    Date of Birth: 1965/09/22  Age: 51 y.o.  Sex: female      Allergies: Aldactone [spironolactone]; Codeine; and Paxil [paroxetine hcl]    Admit Date: 11/29/2016    Discharge Date: 12/02/2016      Admitting Physician: Maylon Cos, MD     * Admission Diagnoses: acute hemorrhage s/p prior [facility]    * Discharge Diagnoses:   Hospital Problems as of 12/02/2016 Date Reviewed: Dec 04, 2016          Codes Class Noted - Resolved POA    Vaginal bleeding ICD-10-CM: N93.9  ICD-9-CM: 623.8  11/29/2016 - Present Unknown               * Procedures: EUA, exlap and control of hemostasis, proctoscopy, loa    * Discharge Condition: Good    [facility] Course: Normal hospital course for this procedure.  Required transfusion due to acute blood loss anemia.    Significant Diagnostic Studies:   Recent Results (from the past 24 hour(s))   GLUCOSE, POC    Collection Time: 12/01/16  5:19 PM   Result Value Ref Range    Glucose (POC) 122 (H) 65 - 100 mg/dL    Performed by Leanne Chang    GLUCOSE, POC    Collection Time: 12/01/16  9:55 PM   Result Value Ref Range    Glucose (POC) 126 (H) 65 - 100 mg/dL    Performed by [facility] KAYLA    CBC W/O DIFF    Collection Time: 12/02/16  6:19 AM   Result Value Ref Range    WBC 9.1 3.6 - 11.0 K/uL    RBC 2.79 (L) 3.80 - 5.20 M/uL    HGB 7.6 (L) 11.5 - 16.0 g/dL    HCT 24.4 (L) 35.0 - 47.0 %    MCV 87.5 80.0 - 99.0 FL    MCH 27.2 26.0 - 34.0 PG    MCHC 31.1 30.0 - 36.5 g/dL    RDW 14.3 11.5 - 14.5 %    PLATELET 370 150 - 400 K/uL    MPV 10.6 8.9 - 12.9 FL    NRBC 0.0 0 PER 100 WBC    ABSOLUTE NRBC 0.00 0.00 - 7.82 K/uL   METABOLIC PANEL, BASIC    Collection Time: 12/02/16  6:19 AM   Result Value Ref Range    Sodium 143 136 - 145 mmol/L    Potassium 3.8 3.5 - 5.1 mmol/L    Chloride 108 97 - 108 mmol/L    CO2 28 21 - 32 mmol/L    Anion gap 7 5 - 15 mmol/L    Glucose 90 65 - 100 mg/dL    BUN 8 6 - 20 MG/DL     Creatinine 0.62 0.55 - 1.02 MG/DL    BUN/Creatinine ratio 13 12 - 20      GFR est AA >60 >60 ml/min/1.47m2    GFR est non-AA >60 >60 ml/min/1.67m2    Calcium 8.7 8.5 - 10.1 MG/DL   GLUCOSE, POC    Collection Time: 12/02/16  6:22 AM   Result Value Ref Range    Glucose (POC) 99 65 - 100 mg/dL    Performed by Bubba Camp        * Disposition: stable    Discharge Medications:   Current Discharge Medication List      START taking these medications    Details   !!  oxyCODONE-acetaminophen (PERCOCET) 5-325 mg per tablet Take 1-2 Tabs by mouth every four (4) hours as needed. Max Daily Amount: 12 Tabs.  Qty: 20 Tab, Refills: 0    Associated Diagnoses: Vaginal bleeding       !! - Potential duplicate medications found. Please discuss with provider.      CONTINUE these medications which have NOT CHANGED    Details   !! oxyCODONE-acetaminophen (PERCOCET) 5-325 mg per tablet Take 1-2 Tabs by mouth every four (4) hours as needed. Max Daily Amount: 12 Tabs.  Qty: 30 Tab, Refills: 0    Associated Diagnoses: Postoperative pain      ibuprofen (MOTRIN) 800 mg tablet Take 1 Tab by mouth every eight (8) hours as needed for Pain.  Qty: 30 Tab, Refills: 0      cinnamon bark (CINNAMON) 500 mg cap Take 1 Tab by mouth daily.      OTHER 1 Tab daily. BERBERINE 1, 000 MCG      SOY ISOFLA/BLK COHOSH/MAG BARK (ESTROVEN PO) Take 1 Tab by mouth daily.      ASHWAGANDHA ROOT EXTRACT,BULK, 2 Tabs by Does Not Apply route daily.      cholecalciferol, VITAMIN D3, (VITAMIN D3) 5,000 unit tab tablet Take 5,000 Units by mouth daily.    Associated Diagnoses: Vitamin D deficiency      cyanocobalamin (VITAMIN B-12) 1,000 mcg tablet Take 1,000 mcg by mouth daily.    Associated Diagnoses: Essential hypertension      Omega-3 Fatty Acids (FISH OIL) 500 mg cap Take 1 Tab by mouth daily.    Associated Diagnoses: Dyslipidemia      aspirin delayed-release 81 mg tablet Take 1 Tab by mouth daily. Indications: prevention of transient ischemic attack   Qty: 90 Tab, Refills: 3    Associated Diagnoses: Type 2 diabetes, diet controlled (HCC)      levothyroxine (SYNTHROID) 100 mcg tablet Take 1 Tab by mouth Daily (before breakfast). Indications: hypothyroidism  Qty: 41 Tab, Refills: 1    Associated Diagnoses: Hypothyroidism due to acquired atrophy of thyroid      raNITIdine (ZANTAC) 150 mg tablet Take 150 mg by mouth two (2) times a day.      metFORMIN ER (GLUCOPHAGE XR) 500 mg tablet TAKE 2 TABLETS BEFORE BREAKFAST AND DINNER FOR TYPE 2 DIABETES MELLITUS  Qty: 360 Tab, Refills: 3      albuterol (PROVENTIL HFA, VENTOLIN HFA, PROAIR HFA) 90 mcg/actuation inhaler Take 2 Puffs by inhalation every four (4) hours as needed for Shortness of Breath. Indications: BRONCHOSPASM PREVENTION  Qty: 1 Inhaler, Refills: 5    Associated Diagnoses: Mild intermittent asthma without complication      b complex vitamins tablet Take 1 Tab by mouth daily.        ascorbic acid (VITAMIN C) 500 mg tablet Take 1,000 mg by mouth daily.      vitamin E (AQUA GEMS) 400 unit capsule Take 400 Units by mouth daily.      EVENING PRIMROSE OIL (EVENING PRIMROSE PO) Take 1 Tab by mouth two (2) times a day.       !! - Potential duplicate medications found. Please discuss with provider.           * Follow-up Care/Patient Instructions:  Activity: No sex, douching, or tampons for 6 weeks or as directed by your physician. No heavy lifting for 6 weeks. No driving while taking pain medication.  Diet: Resume pre-hospital diet  Wound Care: Keep wound clean and dry    Follow-up  Information     Follow up With Specialties Details Why Contact Info    Gerre Pebbles, MD Obstetrics & Gynecology, Gynecology, Obstetrics Call in 1 week For suture removal [place] [address]  [phone]             Signed By:  Memory Argue, MD     December 02, 2016

## 2016-12-02 NOTE — Progress Notes (Signed)
[facility] GYNECOLOGIC ONCOLOGY  [address], [place] [address]  P [phone]   F [phone]       Patient Name: Katrina [name]   Admit Date: 11/29/2016   OR Date: 11/29/2016       Visit Date: 12/02/2016        SUBJECTIVE:    POD#1 s/p EUA, exploratory laparotomy secondary to pelvic hematoma and vaginal bleeding. S/p TLH/BSO on 42/5/95 complicated by vaginal lacerations that required EUA and repair on the same day.     Vaginal packing removed on 12/01/16 at noon. Minimal to scant blood on pad. Otherwise doing well today. Pain controlled. Foley removed this morning. Has not voided as of yet. Denies nausea, vomiting, fevers, or chills. Denies CP, SOB. She is ambulating and tolerating oral intake without issues. Denies lightheadedness or dizziness. Reports flatus yesterday and continues today.    OBJECTIVE:    Patient Vitals for the past 24 hrs:   Temp Pulse Resp BP SpO2   12/02/16 0614 98.6 ??F (37 ??C) 76 16 136/79 97 %   12/01/16 2202 98.2 ??F (36.8 ??C) 68 16 123/76 98 %   12/01/16 1948 ??? ??? ??? 146/75 ???   12/01/16 1505 98.5 ??F (36.9 ??C) 98 16 127/70 97 %   12/01/16 0930 99.2 ??F (37.3 ??C) 79 16 146/79 ???       Date 12/01/16 0700 - 12/02/16 0659 12/02/16 0700 - 12/03/16 0659   Shift 0700-1859 1900-0659 24 Hour Total 0700-1859 1900-0659 24 Hour Total   INTAKE   P.O. 2000 1250 3250        P.O. 2000 1250 3250      I.V.(mL/kg/hr) 716.7(0.7)  716.7(0.3)        I.V. 375  375        Volume (lactated Ringers infusion) 341.7  341.7      Shift Total(mL/kg) 2716.7(30.7) 1250(14.1) 3966.7(44.8)      OUTPUT   Urine(mL/kg/hr) 6387(5.6) 2000(1.9) 4300(2)        Urine Output (mL) ([REMOVED] Urinary Catheter 11/29/16 2- way;Foley) 2300 2000 4300      Shift Total(mL/kg) 4332(95) 1884(16.6) 4300(48.6)      NET 416.7 -750 -333.3      Weight (kg) 88.5 88.5 88.5 88.5 88.5 88.5       Physical Exam     General:  alert, no distress     Cardiac:  Regular rate and rhythm        Lungs:  clear to auscultation bilaterally   Abdomen:  soft, non-tender, without masses or organomegaly, normal bowel sounds       Wound: Dressing is off this morning. Staples in place. C/D/I   Extremity: extremities normal, atraumatic, no cyanosis or edema  Date Review  Lab Results   Component Value Date/Time    WBC 9.1 12/02/2016 06:19 AM    ABS. NEUTROPHILS 10.4 (H) 11/30/2016 04:29 AM    HGB 7.6 (L) 12/02/2016 06:19 AM    HCT 24.4 (L) 12/02/2016 06:19 AM    MCV 87.5 12/02/2016 06:19 AM    MCH 27.2 12/02/2016 06:19 AM    PLATELET 370 12/02/2016 06:19 AM     Lab Results   Component Value Date/Time    Sodium 143 12/01/2016 04:39 AM    Potassium 3.8 12/01/2016 04:39 AM    Chloride 111 (H) 12/01/2016 04:39 AM    CO2 25 12/01/2016 04:39 AM    Glucose 104 (H) 12/01/2016 04:39 AM    BUN 10 12/01/2016  04:39 AM    Creatinine 0.57 12/01/2016 04:39 AM    Calcium 8.6 12/01/2016 04:39 AM    Albumin 3.6 11/29/2016 10:34 AM    Bilirubin, total 0.2 11/29/2016 10:34 AM    AST (SGOT) 12 (L) 11/29/2016 10:34 AM    ALT (SGPT) 25 11/29/2016 10:34 AM    Alk. phosphatase 88 11/29/2016 10:34 AM     IMPRESSION/PLAN:    1 Day Post-Op Procedure(s):  LAPAROTOMY EXPLORATORY, PELVIC EXAM UNDER ANESTHESIA, PROCTOSCOPY for Vaginal Bleeding    Oncologic:  S/p TLH/BSO with Dr. Curt Bears on 11/13/16 for benign fibroids, which was complicated by vaginal lacerations requiring pair the same day. Now POD 1 s/p EUA and exploratory laparotomy (11/29/16). Doing well post-operatively. See full op notes for details   Heme/CV:  s/p 2 units of PRBC on 11/29/16 prior to surgery. Her vitals are appropriate this morning, as are her labs. Hct stable at 24.4 today and yesterday. Vaginal packing removed yesterday around noon. Scant blood on pad.   Renal:  Removed foley this morning. Awaiting void trial.       FEN/GI:  ADAT. Discontinued IVFs 12/01/16.   ID/Wound:  C/D/I with staples.   PPX:  SCDs. Now that Hct is stable, would recommend Lovenox while inpatient.     Dispostion:  Doing well postoperatively. Defer management of DM to primary team. Hct stable at 24.4 yesterday and today. Vaginal packing removed yesterday around noon. Minimal vaginal spotting on pad since that time. Foley out. Plan likely discharge later today if able to void. F/u Dr. Macky Lower team as an outpatient. Given bleeding precautions to call or return sooner. Thank you for allowing [place] to participate in this patient's care. Please do not hesitate to contact our team with any questions or concerns.         Philemon Kingdom, MD

## 2018-08-29 ENCOUNTER — Other Ambulatory Visit: Payer: Self-pay

## 2018-08-29 ENCOUNTER — Encounter: Payer: Self-pay | Admitting: Internal Medicine

## 2018-08-29 ENCOUNTER — Encounter: Payer: Self-pay | Admitting: Gastroenterology

## 2018-08-29 ENCOUNTER — Ambulatory Visit (INDEPENDENT_AMBULATORY_CARE_PROVIDER_SITE_OTHER): Payer: BC Managed Care – PPO | Admitting: Internal Medicine

## 2018-08-29 VITALS — BP 120/80 | HR 91 | Temp 97.8°F | Ht 65.5 in | Wt 207.7 lb

## 2018-08-29 DIAGNOSIS — Z1211 Encounter for screening for malignant neoplasm of colon: Secondary | ICD-10-CM

## 2018-08-29 DIAGNOSIS — I10 Essential (primary) hypertension: Secondary | ICD-10-CM | POA: Diagnosis not present

## 2018-08-29 DIAGNOSIS — E119 Type 2 diabetes mellitus without complications: Secondary | ICD-10-CM

## 2018-08-29 DIAGNOSIS — E785 Hyperlipidemia, unspecified: Secondary | ICD-10-CM | POA: Diagnosis not present

## 2018-08-29 DIAGNOSIS — Z23 Encounter for immunization: Secondary | ICD-10-CM

## 2018-08-29 DIAGNOSIS — E669 Obesity, unspecified: Secondary | ICD-10-CM

## 2018-08-29 DIAGNOSIS — E66811 Obesity, class 1: Secondary | ICD-10-CM

## 2018-08-29 DIAGNOSIS — N959 Unspecified menopausal and perimenopausal disorder: Secondary | ICD-10-CM

## 2018-08-29 DIAGNOSIS — K7581 Nonalcoholic steatohepatitis (NASH): Secondary | ICD-10-CM

## 2018-08-29 DIAGNOSIS — E559 Vitamin D deficiency, unspecified: Secondary | ICD-10-CM | POA: Insufficient documentation

## 2018-08-29 DIAGNOSIS — R740 Nonspecific elevation of levels of transaminase and lactic acid dehydrogenase [LDH]: Secondary | ICD-10-CM

## 2018-08-29 DIAGNOSIS — E039 Hypothyroidism, unspecified: Secondary | ICD-10-CM | POA: Insufficient documentation

## 2018-08-29 DIAGNOSIS — R7401 Elevation of levels of liver transaminase levels: Secondary | ICD-10-CM

## 2018-08-29 NOTE — Addendum Note (Signed)
Addended by: Westley Hummer B on: 08/29/2018 01:34 PM   Modules accepted: Orders

## 2018-08-29 NOTE — Progress Notes (Signed)
New Patient Office Visit     CC/Reason for Visit: Establish care, discuss chronic conditions Previous PCP: Unknown Last Visit: Unknown   HPI: Krista Dawson is a 53 y.o. female who is coming in today for the above mentioned reasons.  She just moved to [place] from [place].  She works at [employer].  She is single and has no children.  Past Medical History is significant for: Hypertension that has been well controlled, hypothyroidism that has been well controlled, hyperlipidemia well-controlled, well-controlled type 2 diabetes, she has vitamin D deficiency on supplementation, she was recently started on Effexor by her GYN for postmenopausal symptoms after hysterectomy.  She also has a history of nonalcoholic steatohepatitis with significant transaminitis.  She was followed in [place] by [place], has had biopsies. She is needing a local gastroenterologist.  She will continue to see her gynecologist in [place].  She has a strong family history of breast cancer and she gets annual mammograms as well as annual breast MRIs.  She has no acute complaints today.   Past Medical/Surgical History: Past Medical History:  Diagnosis Date  . DM (diabetes mellitus) ([place])   . HTN (hypertension)   . Hyperlipidemia   . Hypothyroidism   . NASH (nonalcoholic steatohepatitis)   . Obesity (BMI 30.0-34.9)   . Post menopausal problems   . Transaminitis   . Vitamin D deficiency     History reviewed. No pertinent surgical history.  Social History:  reports that she has quit smoking. She has never used smokeless tobacco. She reports current alcohol use. She reports that she does not use drugs.  Allergies: Allergies  Allergen Reactions  . Spironolactone Hives and Swelling  . Paroxetine Hcl Other (See Comments)    Other reaction(s): Other (comments) WORSENED DEPRESSION. WORSENED DEPRESSION.   Marland Kitchen Codeine Itching and Rash  . Sulfur Rash    Family History:  Family History   Problem Relation Age of Onset  . Breast cancer Mother   . GER disease Father      Current Outpatient Medications:  .  aspirin EC 81 MG tablet, Take 81 mg by mouth daily., Disp: , Rfl:  .  Blood Glucose Monitoring Suppl (CONTOUR NEXT ONE) KIT, by Does not apply route., Disp: , Rfl:  .  Cholecalciferol (VITAMIN D3) 50 MCG (2000 UT) TABS, Take by mouth., Disp: , Rfl:  .  Evening Primrose Oil 1000 MG CAPS, Take by mouth., Disp: , Rfl:  .  hydrochlorothiazide (HYDRODIURIL) 25 MG tablet, Take 25 mg by mouth daily., Disp: , Rfl:  .  levothyroxine (SYNTHROID) 100 MCG tablet, Take 100 mcg by mouth daily before breakfast., Disp: , Rfl:  .  lisinopril (ZESTRIL) 20 MG tablet, Take 20 mg by mouth daily., Disp: , Rfl:  .  Melatonin 10 MG CAPS, Take by mouth., Disp: , Rfl:  .  metFORMIN (GLUCOPHAGE) 500 MG tablet, Take 500 mg by mouth. Take 2 tabs twice daily, Disp: , Rfl:  .  Microlet Lancets MISC, by Does not apply route. Test 3-4 times daily (Micorlet Colored Lancets), Disp: , Rfl:  .  Nutritional Supplements (ESTROVEN EXTRA STRENGTH PO), Take by mouth., Disp: , Rfl:  .  rosuvastatin (CRESTOR) 10 MG tablet, Take 10 mg by mouth daily., Disp: , Rfl:  .  venlafaxine (EFFEXOR) 37.5 MG tablet, Take 37.5 mg by mouth 2 (two) times daily., Disp: , Rfl:   Review of Systems:  Constitutional: Denies fever, chills, diaphoresis, appetite change and fatigue.  HEENT: Denies photophobia,  eye pain, redness, hearing loss, ear pain, congestion, sore throat, rhinorrhea, sneezing, mouth sores, trouble swallowing, neck pain, neck stiffness and tinnitus.   Respiratory: Denies SOB, DOE, cough, chest tightness,  and wheezing.   Cardiovascular: Denies chest pain, palpitations and leg swelling.  Gastrointestinal: Denies nausea, vomiting, abdominal pain, diarrhea, constipation, blood in stool and abdominal distention.  Genitourinary: Denies dysuria, urgency, frequency, hematuria, flank pain and difficulty urinating.   Endocrine: Denies: hot or cold intolerance, sweats, changes in hair or nails, polyuria, polydipsia. Musculoskeletal: Denies myalgias, back pain, joint swelling, arthralgias and gait problem.  Skin: Denies pallor, rash and wound.  Neurological: Denies dizziness, seizures, syncope, weakness, light-headedness, numbness and headaches.  Hematological: Denies adenopathy. Easy bruising, personal or family bleeding history  Psychiatric/Behavioral: Denies suicidal ideation, mood changes, confusion, nervousness, sleep disturbance and agitation    Physical Exam: Vitals:   08/29/18 1135  BP: 120/80  Pulse: 91  Temp: 97.8 F (36.6 C)  TempSrc: Temporal  SpO2: 97%  Weight: 207 lb 11.2 oz (94.2 kg)  Height: 5' 5.5" (1.664 m)   Body mass index is 34.04 kg/m.   Constitutional: NAD, calm, comfortable Eyes: PERRL, lids and conjunctivae normal, wears corrective lenses ENMT: Mucous membranes are moist. Respiratory: clear to auscultation bilaterally, no wheezing, no crackles. Normal respiratory effort. No accessory muscle use.  Cardiovascular: Regular rate and rhythm, no murmurs / rubs / gallops. No extremity edema. 2+ pedal pulses.  Abdomen: Obese, no tenderness, no masses palpated. No hepatosplenomegaly. Bowel sounds positive.  Musculoskeletal: no clubbing / cyanosis. No joint deformity upper and lower extremities. Good ROM, no contractures. Normal muscle tone.  Skin: no rashes, lesions, ulcers. No induration Neurologic: Grossly intact and nonfocal Psychiatric: Normal judgment and insight. Alert and oriented x 3. Normal mood.    Impression and Plan:   Essential hypertension -Well-controlled, continue current meds.  Type 2 diabetes mellitus without complication, without long-term current use of insulin ([place]) -States her last A1c was in June and was in the 6 range. -Continue current regimen  Hyperlipidemia, unspecified hyperlipidemia type -LDL in early August was 44, triglycerides are  above 200. -She is on rosuvastatin 10 mg. -Advised fish oil 2 g daily.  Hypothyroidism, unspecified type -Well-controlled on levothyroxine 100 mcg.  NASH (nonalcoholic steatohepatitis) -This is biopsy-proven.  She has brought with her her last note from the hepatologist in [place] stated July 18, 2018 which I will scan into the chart today.  States she had biopsy in 2007 with portal fibrosis with redo biopsy in 2011. She participated in some kind of clinical trial. -Recent lab work from early August does confirm transaminitis with an AST of 151 and an ALT of 197.  This will also be scanned into chart. -She will need to be followed by local GI, will initiate referral for this reason.  Obesity (BMI 30.0-34.9) -Discussed healthy lifestyle, including increased physical activity and better food choices to promote weight loss.  Vitamin D deficiency -Check levels at next physical.  Post menopausal problems -Is on Effexor prescribed by GYN with good results.     Patient Instructions  -Nice seeing you today!!  -GI referral today.  -Schedule follow up in 3-4 months.     Lelon Frohlich, MD [facility]

## 2018-08-29 NOTE — Patient Instructions (Signed)
-  Nice seeing you today!!  -GI referral today.  -Schedule follow up in 3-4 months.

## 2018-09-19 ENCOUNTER — Other Ambulatory Visit: Payer: Self-pay

## 2018-09-19 ENCOUNTER — Ambulatory Visit (AMBULATORY_SURGERY_CENTER): Payer: Self-pay

## 2018-09-19 ENCOUNTER — Encounter: Payer: Self-pay | Admitting: Gastroenterology

## 2018-09-19 VITALS — Ht 65.5 in | Wt 210.0 lb

## 2018-09-19 DIAGNOSIS — Z1211 Encounter for screening for malignant neoplasm of colon: Secondary | ICD-10-CM

## 2018-09-19 MED ORDER — NA SULFATE-K SULFATE-MG SULF 17.5-3.13-1.6 GM/177ML PO SOLN: 1.0000 | Freq: Once | ORAL | 0 refills | Status: AC

## 2018-09-19 NOTE — Progress Notes (Signed)
Denies allergies to eggs or soy products. Denies complication of anesthesia or sedation. Denies use of weight loss medication. Denies use of O2.   Emmi instructions given for colonoscopy.  A 15.00 coupon for Suprep was given to the patient.

## 2018-10-02 ENCOUNTER — Telehealth: Payer: Self-pay

## 2018-10-02 NOTE — Telephone Encounter (Signed)
Covid-19 screening questions   Do you now or have you had a fever in the last 14 days? NO   Do you have any respiratory symptoms of shortness of breath or cough now or in the last 14 days? NO   Do you have any family members or close contacts with diagnosed or suspected Covid-19 in the past 14 days? NO   Have you been tested for Covid-19 and found to be positive? NO

## 2018-10-03 ENCOUNTER — Other Ambulatory Visit: Payer: Self-pay

## 2018-10-03 ENCOUNTER — Ambulatory Visit (AMBULATORY_SURGERY_CENTER): Payer: BC Managed Care – PPO | Admitting: Gastroenterology

## 2018-10-03 ENCOUNTER — Other Ambulatory Visit: Payer: Self-pay | Admitting: Gastroenterology

## 2018-10-03 ENCOUNTER — Encounter: Payer: Self-pay | Admitting: Gastroenterology

## 2018-10-03 VITALS — BP 107/62 | HR 72 | Temp 98.8°F | Resp 14 | Ht 65.0 in | Wt 210.0 lb

## 2018-10-03 DIAGNOSIS — Z1211 Encounter for screening for malignant neoplasm of colon: Secondary | ICD-10-CM | POA: Diagnosis not present

## 2018-10-03 DIAGNOSIS — D122 Benign neoplasm of ascending colon: Secondary | ICD-10-CM

## 2018-10-03 MED ORDER — SODIUM CHLORIDE 0.9 % IV SOLN: 500.0000 mL | Freq: Once | INTRAVENOUS | Status: DC

## 2018-10-03 NOTE — Patient Instructions (Signed)
YOU HAD AN ENDOSCOPIC PROCEDURE TODAY AT [facility]:   Refer to the procedure report that was given to you for any specific questions about what was found during the examination.  If the procedure report does not answer your questions, please call your gastroenterologist to clarify.  If you requested that your care partner not be given the details of your procedure findings, then the procedure report has been included in a sealed envelope for you to review at your convenience later.  **Handout given on polyps**   YOU SHOULD EXPECT: Some feelings of bloating in the abdomen. Passage of more gas than usual.  Walking can help get rid of the air that was put into your GI tract during the procedure and reduce the bloating. If you had a lower endoscopy (such as a colonoscopy or flexible sigmoidoscopy) you may notice spotting of blood in your stool or on the toilet paper. If you underwent a bowel prep for your procedure, you may not have a normal bowel movement for a few days.  Please Note:  You might notice some irritation and congestion in your nose or some drainage.  This is from the oxygen used during your procedure.  There is no need for concern and it should clear up in a day or so.  SYMPTOMS TO REPORT IMMEDIATELY:   Following lower endoscopy (colonoscopy or flexible sigmoidoscopy):  Excessive amounts of blood in the stool  Significant tenderness or worsening of abdominal pains  Swelling of the abdomen that is new, acute  Fever of 100F or higher   For urgent or emergent issues, a gastroenterologist can be reached at any hour by calling [phone].   DIET:  We do recommend a small meal at first, but then you may proceed to your regular diet.  Drink plenty of fluids but you should avoid alcoholic beverages for 24 hours.  ACTIVITY:  You should plan to take it easy for the rest of today and you should NOT DRIVE or use heavy machinery until tomorrow (because of the sedation  medicines used during the test).    FOLLOW UP: Our staff will call the number listed on your records 48-72 hours following your procedure to check on you and address any questions or concerns that you may have regarding the information given to you following your procedure. If we do not reach you, we will leave a message.  We will attempt to reach you two times.  During this call, we will ask if you have developed any symptoms of COVID 19. If you develop any symptoms (ie: fever, flu-like symptoms, shortness of breath, cough etc.) before then, please call [phone].  If you test positive for Covid 19 in the 2 weeks post procedure, please call and report this information to [place].    If any biopsies were taken you will be contacted by phone or by letter within the next 1-3 weeks.  Please call us at [phone] if you have not heard about the biopsies in 3 weeks.    SIGNATURES/CONFIDENTIALITY: You and/or your care partner have signed paperwork which will be entered into your electronic medical record.  These signatures attest to the fact that that the information above on your After Visit Summary has been reviewed and is understood.  Full responsibility of the confidentiality of this discharge information lies with you and/or your care-partner.

## 2018-10-03 NOTE — Op Note (Signed)
[place] Patient Name: Krista Dawson Procedure Date: 10/03/2018 8:17 AM MRN: [id number] Endoscopist: Thornton Park MD, MD Age: 53 Referring MD:  Date of Birth: [birth year] Gender: Female Account #: [id number] Procedure:                Colonoscopy Indications:              Screening for colorectal malignant neoplasm, This                            is the patient's first colonoscopy                           No baseline GI symptoms                           Sister with chek2 mutation and a history of an                            adenoma >1.0 cm                           Mother with colon polyps and breast cancer                           Maternal uncle and paternal uncle with pancreatic                            cancer <60 Medicines:                See the Anesthesia note for documentation of the                            administered medications Procedure:                Pre-Anesthesia Assessment:                           - Prior to the procedure, a History and Physical                            was performed, and patient medications and                            allergies were reviewed. The patient's tolerance of                            previous anesthesia was also reviewed. The risks                            and benefits of the procedure and the sedation                            options and risks were discussed with the patient.                            All questions were answered,  and informed consent                            was obtained. Prior Anticoagulants: The patient has                            taken no previous anticoagulant or antiplatelet                            agents. ASA Grade Assessment: II - A patient with                            mild systemic disease. After reviewing the risks                            and benefits, the patient was deemed in                            satisfactory condition to undergo the procedure.                         After obtaining informed consent, the colonoscope                            was passed under direct vision. Throughout the                            procedure, the patient's blood pressure, pulse, and                            oxygen saturations were monitored continuously. The                            LOANER 0255 was introduced through the anus and                            advanced to the the terminal ileum, with                            identification of the appendiceal orifice and IC                            valve. A second forward view of the right colon was                            performed. The colonoscopy was performed without                            difficulty. The patient tolerated the procedure                            well. The quality of the bowel preparation was  good. The terminal ileum, ileocecal valve,                            appendiceal orifice, and rectum were photographed. Scope In: 8:28:04 AM Scope Out: 8:43:55 AM Scope Withdrawal Time: 0 hours 11 minutes 23 seconds  Total Procedure Duration: 0 hours 15 minutes 51 seconds  Findings:                 The perianal and digital rectal examinations were                            normal.                           A 2 mm polyp was found in the ascending colon. The                            polyp was sessile. The polyp was removed with a                            cold snare. Resection and retrieval were complete.                            Estimated blood loss was minimal.                           The exam was otherwise without abnormality on                            direct and retroflexion views. Complications:            No immediate complications. Estimated blood loss:                            Minimal. Estimated Blood Loss:     Estimated blood loss was minimal. Impression:               - One 2 mm polyp in the ascending colon, removed                             with a cold snare. Resected and retrieved.                           - The examination was otherwise normal on direct                            and retroflexion views. Recommendation:           - Patient has a contact number available for                            emergencies. The signs and symptoms of potential                            delayed complications were discussed with the  patient. Return to normal activities tomorrow.                            Written discharge instructions were provided to the                            patient.                           - Resume previous diet.                           - Continue present medications.                           - Await pathology results.                           - Repeat colonoscopy in 5 years for surveillance                            given the family history.                           - Consider genetic counseling given your family                            history. Thornton Park MD, MD 10/03/2018 8:51:38 AM This report has been signed electronically.

## 2018-10-03 NOTE — Progress Notes (Signed)
[place]  Pt's states no medical or surgical changes since previsit or office visit.

## 2018-10-03 NOTE — Progress Notes (Signed)
Report given to PACU, vss 

## 2018-10-03 NOTE — Progress Notes (Signed)
Called to room to assist during endoscopic procedure.  Patient ID and intended procedure confirmed with present staff. Received instructions for my participation in the procedure from the performing physician.  

## 2018-10-07 ENCOUNTER — Telehealth: Payer: Self-pay

## 2018-10-07 NOTE — Telephone Encounter (Signed)
  Follow up Call-  Call back number 10/03/2018  Post procedure Call Back phone  # [phone] cell  Permission to leave phone message Yes     Patient questions:  Do you have a fever, pain , or abdominal swelling? No. Pain Score  0 *  Have you tolerated food without any problems? Yes.    Have you been able to return to your normal activities? Yes.    Do you have any questions about your discharge instructions: Diet   No. Medications  No. Follow up visit  No.  Do you have questions or concerns about your Care? No.  Actions: * If pain score is 4 or above: No action needed, pain <4.  1. Have you developed a fever since your procedure? no  2.   Have you had an respiratory symptoms (SOB or cough) since your procedure? no  3.   Have you tested positive for COVID 19 since your procedure no  4.   Have you had any family members/close contacts diagnosed with the COVID 19 since your procedure?  no   If yes to any of these questions please route to Joylene John, RN and Alphonsa Gin, Therapist, sports.

## 2018-10-14 ENCOUNTER — Encounter: Payer: Self-pay | Admitting: Gastroenterology

## 2018-10-14 ENCOUNTER — Encounter: Payer: Self-pay | Admitting: Internal Medicine

## 2018-11-27 ENCOUNTER — Other Ambulatory Visit: Payer: Self-pay

## 2018-11-28 ENCOUNTER — Ambulatory Visit (INDEPENDENT_AMBULATORY_CARE_PROVIDER_SITE_OTHER): Payer: BC Managed Care – PPO | Admitting: Internal Medicine

## 2018-11-28 ENCOUNTER — Encounter: Payer: Self-pay | Admitting: Internal Medicine

## 2018-11-28 VITALS — BP 120/80 | HR 76 | Temp 97.2°F | Wt 212.8 lb

## 2018-11-28 DIAGNOSIS — E039 Hypothyroidism, unspecified: Secondary | ICD-10-CM

## 2018-11-28 DIAGNOSIS — K7581 Nonalcoholic steatohepatitis (NASH): Secondary | ICD-10-CM

## 2018-11-28 DIAGNOSIS — I10 Essential (primary) hypertension: Secondary | ICD-10-CM

## 2018-11-28 DIAGNOSIS — Z0000 Encounter for general adult medical examination without abnormal findings: Secondary | ICD-10-CM | POA: Diagnosis not present

## 2018-11-28 DIAGNOSIS — Z23 Encounter for immunization: Secondary | ICD-10-CM

## 2018-11-28 DIAGNOSIS — E119 Type 2 diabetes mellitus without complications: Secondary | ICD-10-CM

## 2018-11-28 DIAGNOSIS — E785 Hyperlipidemia, unspecified: Secondary | ICD-10-CM | POA: Diagnosis not present

## 2018-11-28 DIAGNOSIS — E559 Vitamin D deficiency, unspecified: Secondary | ICD-10-CM

## 2018-11-28 LAB — POCT GLYCOSYLATED HEMOGLOBIN (HGB A1C): Hemoglobin A1C: 8.6 % — AB (ref 4.0–5.6)

## 2018-11-28 LAB — COMPREHENSIVE METABOLIC PANEL
ALT: 220 U/L — ABNORMAL HIGH (ref 0–35)
AST: 167 U/L — ABNORMAL HIGH (ref 0–37)
Albumin: 4.7 g/dL (ref 3.5–5.2)
Alkaline Phosphatase: 99 U/L (ref 39–117)
BUN: 14 mg/dL (ref 6–23)
CO2: 25 mEq/L (ref 19–32)
Calcium: 10.3 mg/dL (ref 8.4–10.5)
Chloride: 102 mEq/L (ref 96–112)
Creatinine, Ser: 0.7 mg/dL (ref 0.40–1.20)
GFR: 88.05 mL/min (ref >60.00)
Glucose, Bld: 224 mg/dL — ABNORMAL HIGH (ref 70–99)
Potassium: 4.6 mEq/L (ref 3.5–5.1)
Sodium: 138 mEq/L (ref 135–145)
Total Bilirubin: 0.4 mg/dL (ref 0.2–1.2)
Total Protein: 6.8 g/dL (ref 6.0–8.3)

## 2018-11-28 LAB — VITAMIN D 25 HYDROXY (VIT D DEFICIENCY, FRACTURES): VITD: 45.96 ng/mL (ref 30.00–100.00)

## 2018-11-28 LAB — LIPID PANEL
Cholesterol: 136 mg/dL (ref 0–200)
HDL: 38.9 mg/dL — ABNORMAL LOW (ref >39.00)
LDL Cholesterol: 66 mg/dL (ref 0–99)
NonHDL: 96.98
Total CHOL/HDL Ratio: 3
Triglycerides: 157 mg/dL — ABNORMAL HIGH (ref 0.0–149.0)
VLDL: 31.4 mg/dL (ref 0.0–40.0)

## 2018-11-28 LAB — CBC WITH DIFFERENTIAL/PLATELET
Basophils Absolute: 0 10*3/uL (ref 0.0–0.1)
Basophils Relative: 0.5 % (ref 0.0–3.0)
Eosinophils Absolute: 0.3 10*3/uL (ref 0.0–0.7)
Eosinophils Relative: 3.6 % (ref 0.0–5.0)
HCT: 39.1 % (ref 36.0–46.0)
Hemoglobin: 12.9 g/dL (ref 12.0–15.0)
Lymphocytes Relative: 31.8 % (ref 12.0–46.0)
Lymphs Abs: 2.3 10*3/uL (ref 0.7–4.0)
MCHC: 33.1 g/dL (ref 30.0–36.0)
MCV: 84.6 fl (ref 78.0–100.0)
Monocytes Absolute: 0.3 10*3/uL (ref 0.1–1.0)
Monocytes Relative: 4.3 % (ref 3.0–12.0)
Neutro Abs: 4.3 10*3/uL (ref 1.4–7.7)
Neutrophils Relative %: 59.8 % (ref 43.0–77.0)
Platelets: 269 10*3/uL (ref 150.0–400.0)
RBC: 4.62 Mil/uL (ref 3.87–5.11)
RDW: 13.6 % (ref 11.5–15.5)
WBC: 7.1 10*3/uL (ref 4.0–10.5)

## 2018-11-28 LAB — TSH: TSH: 0.69 u[IU]/mL (ref 0.35–4.50)

## 2018-11-28 LAB — VITAMIN B12: Vitamin B-12: 462 pg/mL (ref 211–911)

## 2018-11-28 MED ORDER — TRULICITY 0.75 MG/0.5ML ~~LOC~~ SOAJ: 0.7500 mg | SUBCUTANEOUS | 3 refills | Status: DC

## 2018-11-28 NOTE — Patient Instructions (Signed)
-  Nice seeing you today!!  -Lab work today; will notify you once results are available.  -Start trulicity 3.01 1 subcutaneous injection a week.  -Make sure you schedule follow up with Dr. Tarri Glenn for your NASH.  -continue to work on diet and exercise and weight loss.  -Schedule follow up in 3 months.

## 2018-11-28 NOTE — Addendum Note (Signed)
Addended by: Westley Hummer B on: 11/28/2018 04:42 PM   Modules accepted: Orders

## 2018-11-28 NOTE — Progress Notes (Signed)
Established Patient Office Visit     CC/Reason for Visit: Follow-up chronic conditions, wants lab work  HPI: Krista Dawson is a 53 y.o. female who is coming in today for the above mentioned reasons. Past Medical History is significant for: Hypertension that has been well controlled, hypothyroidism that has been well controlled, hyperlipidemia well-controlled, well-controlled type 2 diabetes, she has vitamin D deficiency on supplementation, she was recently started on Effexor by her GYN for postmenopausal symptoms after hysterectomy.  She also has a history of nonalcoholic steatohepatitis with significant transaminitis.  She was followed in [place] by G, has had biopsies. She is needing a local gastroenterologist.  She will continue to see her gynecologist in [place].  She has a strong family history of breast cancer and she gets annual mammograms as well as annual breast MRIs.  She has no acute complaints today.  Since I last saw her, she had a screening colonoscopy but does not have an appointment to talk to GI about her Karlene Lineman.  She had her annual physical with GYN in September and was told that she no longer needs Pap smears.  She is requesting blood work today.   Past Medical/Surgical History: Past Medical History:  Diagnosis Date  . Allergy   . Asthma    exercise induced - not used inhaler couple of yrs pt reported 10-03-2018  . Blood transfusion without reported diagnosis   . Depression   . DM (diabetes mellitus) ([place])   . GERD (gastroesophageal reflux disease)   . HTN (hypertension)   . Hyperlipidemia   . Hypothyroidism   . NASH (nonalcoholic steatohepatitis)   . Obesity (BMI 30.0-34.9)   . Post menopausal problems   . Sleep apnea    no c-pap  . Transaminitis   . Vitamin D deficiency     Past Surgical History:  Procedure Laterality Date  . ADRENALECTOMY Right   . APPENDECTOMY    . BREAST REDUCTION SURGERY    . CHOLECYSTECTOMY    . HYSTERECTOMY ABDOMINAL WITH  SALPINGECTOMY    . LIVER BIOPSY  2008, 2013   NASH  . TONSILLECTOMY    . UPPER GASTROINTESTINAL ENDOSCOPY      Social History:  reports that she has quit smoking. Her smoking use included cigarettes. She has never used smokeless tobacco. She reports current alcohol use. She reports that she does not use drugs.  Allergies: Allergies  Allergen Reactions  . Spironolactone Hives and Swelling  . Paroxetine Hcl Other (See Comments)    Other reaction(s): Other (comments) WORSENED DEPRESSION. WORSENED DEPRESSION.   Marland Kitchen Codeine Itching and Rash  . Sulfur Rash    Family History:  Family History  Problem Relation Age of Onset  . Breast cancer Mother   . GER disease Father   . Colon cancer Neg Hx   . Esophageal cancer Neg Hx   . Stomach cancer Neg Hx   . Rectal cancer Neg Hx      Current Outpatient Medications:  .  aspirin EC 81 MG tablet, Take 81 mg by mouth daily., Disp: , Rfl:  .  Blood Glucose Monitoring Suppl (CONTOUR NEXT ONE) KIT, by Does not apply route., Disp: , Rfl:  .  Cholecalciferol (VITAMIN D3) 50 MCG (2000 UT) TABS, Take by mouth., Disp: , Rfl:  .  Evening Primrose Oil 1000 MG CAPS, Take by mouth., Disp: , Rfl:  .  hydrochlorothiazide (HYDRODIURIL) 25 MG tablet, Take 25 mg by mouth daily., Disp: , Rfl:  .  levothyroxine (SYNTHROID) 100 MCG tablet, Take 100 mcg by mouth daily before breakfast., Disp: , Rfl:  .  lisinopril (ZESTRIL) 20 MG tablet, Take 20 mg by mouth daily., Disp: , Rfl:  .  Melatonin 10 MG CAPS, Take by mouth., Disp: , Rfl:  .  metFORMIN (GLUCOPHAGE) 500 MG tablet, Take 500 mg by mouth. Take 2 tabs twice daily, Disp: , Rfl:  .  Microlet Lancets MISC, by Does not apply route. Test 3-4 times daily (Micorlet Colored Lancets), Disp: , Rfl:  .  Nutritional Supplements (ESTROVEN EXTRA STRENGTH PO), Take by mouth., Disp: , Rfl:  .  Omega-3 Fatty Acids (FISH OIL) 1000 MG CAPS, Take by mouth., Disp: , Rfl:  .  omeprazole (PRILOSEC) 20 MG capsule, Take 20 mg by  mouth daily., Disp: , Rfl:  .  OVER THE COUNTER MEDICATION, Fish Oil, 1200 mg. Two capsules daily., Disp: , Rfl:  .  rosuvastatin (CRESTOR) 10 MG tablet, Take 10 mg by mouth daily., Disp: , Rfl:  .  venlafaxine (EFFEXOR) 37.5 MG tablet, Take 37.5 mg by mouth 2 (two) times daily., Disp: , Rfl:  .  Dulaglutide (TRULICITY) 1.88 CZ/6.6AY SOPN, Inject 0.75 mg into the skin once a week., Disp: 4 pen, Rfl: 3  Review of Systems:  Constitutional: Denies fever, chills, diaphoresis, appetite change and fatigue.  HEENT: Denies photophobia, eye pain, redness, hearing loss, ear pain, congestion, sore throat, rhinorrhea, sneezing, mouth sores, trouble swallowing, neck pain, neck stiffness and tinnitus.   Respiratory: Denies SOB, DOE, cough, chest tightness,  and wheezing.   Cardiovascular: Denies chest pain, palpitations and leg swelling.  Gastrointestinal: Denies nausea, vomiting, abdominal pain, diarrhea, constipation, blood in stool and abdominal distention.  Genitourinary: Denies dysuria, urgency, frequency, hematuria, flank pain and difficulty urinating.  Endocrine: Denies: hot or cold intolerance, sweats, changes in hair or nails, polyuria, polydipsia. Musculoskeletal: Denies myalgias, back pain, joint swelling, arthralgias and gait problem.  Skin: Denies pallor, rash and wound.  Neurological: Denies dizziness, seizures, syncope, weakness, light-headedness, numbness and headaches.  Hematological: Denies adenopathy. Easy bruising, personal or family bleeding history  Psychiatric/Behavioral: Denies suicidal ideation, mood changes, confusion, nervousness, sleep disturbance and agitation    Physical Exam: Vitals:   11/28/18 0904  BP: 120/80  Pulse: 76  Temp: (!) 97.2 F (36.2 C)  TempSrc: Temporal  SpO2: 97%  Weight: 212 lb 12.8 oz (96.5 kg)    Body mass index is 35.41 kg/m.   Constitutional: NAD, calm, comfortable Eyes: PERRL, lids and conjunctivae normal, wears corrective lenses ENMT:  Mucous membranes are moist. Respiratory: clear to auscultation bilaterally, no wheezing, no crackles. Normal respiratory effort. No accessory muscle use.  Cardiovascular: Regular rate and rhythm, no murmurs / rubs / gallops. No extremity edema. 2+ pedal pulses.  Abdomen: no tenderness, no masses palpated. No hepatosplenomegaly. Bowel sounds positive.  Musculoskeletal: no clubbing / cyanosis. No joint deformity upper and lower extremities. Good ROM, no contractures. Normal muscle tone.  Skin: no rashes, lesions, ulcers. No induration Neurologic: Grossly intact and nonfocal  Psychiatric: Normal judgment and insight. Alert and oriented x 3. Normal mood.    Impression and Plan:  Type 2 diabetes mellitus without complication, without long-term current use of insulin ([place])  -Unfortunately her A1c has increased from 6-8.6. -She notes she has been careless with diet and exercising during the COVID-19 pandemic. -She has been taking Metformin 1000 mg twice daily. -She agrees to trying Trulicity 3.01 weekly, will see her back in 3 months for follow-up.  Essential hypertension  -  Blood pressures well controlled, continue lisinopril and hydrochlorothiazide.  Hyperlipidemia, unspecified hyperlipidemia type  -Check lipids today, she is on rosuvastatin 10 mg.  Hypothyroidism, unspecified type -Check TSH, for now continue 100 mcg of levothyroxine.  NASH (nonalcoholic steatohepatitis) -This is biopsy-proven.  She has brought with her her last note from the hepatologist in [place] stated July 18, 2018 which I will scan into the chart today.  States she had biopsy in 2007 with portal fibrosis with redo biopsy in 2011. She participated in some kind of clinical trial. -Recent lab work from early August does confirm transaminitis with an AST of 151 and an ALT of 197.  This will also be scanned into chart. -She will need to be followed by local GI, will initiate referral for this reason. -Check LFTs  today.  Vitamin D deficiency -Check vit D.  Morbid obesity ([place]) -Discussed healthy lifestyle, including increased physical activity and better food choices to promote weight loss.    Patient Instructions  -Nice seeing you today!!  -Lab work today; will notify you once results are available.  -Start trulicity 3.24 1 subcutaneous injection a week.  -Make sure you schedule follow up with Dr. Tarri Glenn for your NASH.  -continue to work on diet and exercise and weight loss.  -Schedule follow up in 3 months.     Lelon Frohlich, MD [facility] at [facility]

## 2018-12-20 ENCOUNTER — Encounter: Payer: Self-pay | Admitting: Internal Medicine

## 2018-12-22 ENCOUNTER — Other Ambulatory Visit: Payer: Self-pay | Admitting: *Deleted

## 2018-12-22 MED ORDER — LEVOTHYROXINE SODIUM 100 MCG PO TABS: 100.0000 ug | ORAL_TABLET | Freq: Every day | ORAL | 0 refills | Status: DC

## 2018-12-22 MED ORDER — ROSUVASTATIN CALCIUM 10 MG PO TABS: 10.0000 mg | ORAL_TABLET | Freq: Every day | ORAL | 0 refills | Status: DC

## 2018-12-22 MED ORDER — METFORMIN HCL 500 MG PO TABS: 500.0000 mg | ORAL_TABLET | Freq: Two times a day (BID) | ORAL | 0 refills | Status: DC

## 2019-01-06 ENCOUNTER — Other Ambulatory Visit: Payer: Self-pay

## 2019-01-07 ENCOUNTER — Ambulatory Visit (INDEPENDENT_AMBULATORY_CARE_PROVIDER_SITE_OTHER): Payer: BC Managed Care – PPO | Admitting: Family Medicine

## 2019-01-07 ENCOUNTER — Other Ambulatory Visit: Payer: Self-pay | Admitting: *Deleted

## 2019-01-07 ENCOUNTER — Encounter: Payer: Self-pay | Admitting: Family Medicine

## 2019-01-07 ENCOUNTER — Other Ambulatory Visit: Payer: Self-pay

## 2019-01-07 VITALS — BP 124/80 | HR 84 | Temp 97.3°F | Ht 65.5 in | Wt 210.4 lb

## 2019-01-07 DIAGNOSIS — M549 Dorsalgia, unspecified: Secondary | ICD-10-CM

## 2019-01-07 DIAGNOSIS — E119 Type 2 diabetes mellitus without complications: Secondary | ICD-10-CM

## 2019-01-07 LAB — MICROALBUMIN / CREATININE URINE RATIO
Creatinine,U: 71.4 mg/dL
Microalb Creat Ratio: 3.4 mg/g (ref 0.0–30.0)
Microalb, Ur: 2.4 mg/dL — ABNORMAL HIGH (ref 0.0–1.9)

## 2019-01-07 MED ORDER — HYDROCHLOROTHIAZIDE 25 MG PO TABS: 25.0000 mg | ORAL_TABLET | Freq: Every day | ORAL | 0 refills | Status: DC

## 2019-01-07 MED ORDER — CYCLOBENZAPRINE HCL 10 MG PO TABS: 10.0000 mg | ORAL_TABLET | Freq: Three times a day (TID) | ORAL | 0 refills | Status: DC | PRN

## 2019-01-07 MED ORDER — METFORMIN HCL 500 MG PO TABS: ORAL_TABLET | ORAL | 0 refills | Status: DC

## 2019-01-07 NOTE — Progress Notes (Signed)
Subjective:     Patient ID: Krista Dawson, female   DOB: Nov 08, 1965, 53 y.o.   MRN: [id number]  HPI Drew is seen with left shoulder and upper back pain.  On Christmas Day her dad was leaning against her and she thinks she may have strained something in the shoulder and upper back region then.  She did not have any immediate pain but noted soreness by Sunday morning.  No radiculitis symptoms.  No neck pain.  Pain is mostly superior border of the scapular region and also subacromial region.  Some pain with abduction.  She has tried ice and ibuprofen with some relief.  She states her muscles feel very tight in that region.  Frequently carries muscle tension in her upper back and neck  She also has type 2 diabetes and is requesting urine microalbumin screen.  Even though she takes lisinopril 20 mg daily her health insurance will be reduced to $25 per month through her work if she has once yearly microalbumin screen.  Past Medical History:  Diagnosis Date  . Allergy   . Asthma    exercise induced - not used inhaler couple of yrs pt reported 10-03-2018  . Blood transfusion without reported diagnosis   . Depression   . DM (diabetes mellitus) ([place])   . GERD (gastroesophageal reflux disease)   . HTN (hypertension)   . Hyperlipidemia   . Hypothyroidism   . NASH (nonalcoholic steatohepatitis)   . Obesity (BMI 30.0-34.9)   . Post menopausal problems   . Sleep apnea    no c-pap  . Transaminitis   . Vitamin D deficiency    Past Surgical History:  Procedure Laterality Date  . ADRENALECTOMY Right   . APPENDECTOMY    . BREAST REDUCTION SURGERY    . CHOLECYSTECTOMY    . HYSTERECTOMY ABDOMINAL WITH SALPINGECTOMY    . LIVER BIOPSY  2008, 2013   NASH  . TONSILLECTOMY    . UPPER GASTROINTESTINAL ENDOSCOPY      reports that she has quit smoking. Her smoking use included cigarettes. She has never used smokeless tobacco. She reports current alcohol use. She reports that she does not use  drugs. family history includes Breast cancer in her mother; GER disease in her father. Allergies  Allergen Reactions  . Spironolactone Hives and Swelling  . Paroxetine Hcl Other (See Comments)    Other reaction(s): Other (comments) WORSENED DEPRESSION. WORSENED DEPRESSION.   Marland Kitchen Codeine Itching and Rash  . Sulfur Rash     Review of Systems  Respiratory: Negative for shortness of breath.   Cardiovascular: Negative for chest pain.  Musculoskeletal: Positive for back pain. Negative for neck pain.  Neurological: Negative for weakness, numbness and headaches.       Objective:   Physical Exam Vitals reviewed.  Constitutional:      Appearance: Normal appearance.  Cardiovascular:     Rate and Rhythm: Normal rate and regular rhythm.  Musculoskeletal:     Cervical back: Neck supple.     Comments: She has good range of motion left shoulder.  She does have some diffuse nonspecific tenderness left trapezius but also some subacromial tenderness as well.  She has mild pain with abduction against resistance.  No acromioclavicular joint tenderness.  No biceps tenderness.  Neurological:     Mental Status: She is alert.        Assessment:     Poorly localized pain left shoulder upper back region.  Suspect at least some of this is muscular soreness.  She may have some mild impingement as well.  No weakness    Plan:     -Refilled Flexeril which she has taken in the past 10 mg nightly.  She is aware of potential sedation. -Continue conservative therapies with ice and ibuprofen as tolerated -Urine microalbumin screen obtained per patient request because of health insurance issues with reduced premiums  Eulas Post MD [facility]

## 2019-01-07 NOTE — Patient Instructions (Signed)
Continue with alternate heat and ice and muscle massage  Use the Flexeril at night- can cause sedation.   Touch base if not improving in a couple of weeks.

## 2019-02-05 ENCOUNTER — Other Ambulatory Visit: Payer: Self-pay

## 2019-02-06 ENCOUNTER — Encounter: Payer: Self-pay | Admitting: Internal Medicine

## 2019-02-06 ENCOUNTER — Ambulatory Visit (INDEPENDENT_AMBULATORY_CARE_PROVIDER_SITE_OTHER): Payer: BC Managed Care – PPO | Admitting: Internal Medicine

## 2019-02-06 DIAGNOSIS — E119 Type 2 diabetes mellitus without complications: Secondary | ICD-10-CM | POA: Diagnosis not present

## 2019-02-06 MED ORDER — TRULICITY 1.5 MG/0.5ML ~~LOC~~ SOAJ: 1.5000 mg | SUBCUTANEOUS | 2 refills | Status: DC

## 2019-02-06 NOTE — Patient Instructions (Signed)
-  Nice seeing you today!!  -Increase Trulicity to 1.5 mg SQ weekly.  -Schedule a 3 month follow up.

## 2019-02-06 NOTE — Progress Notes (Signed)
Established Patient Office Visit     This visit occurred during the SARS-CoV-2 public health emergency.  Safety protocols were in place, including screening questions prior to the visit, additional usage of staff PPE, and extensive cleaning of exam room while observing appropriate contact time as indicated for disinfecting solutions.    CC/Reason for Visit: Diabetes follow-up  HPI: Krista Dawson is a 54 y.o. female who is coming in today for the above mentioned reasons. Past Medical History is significant for: Hypertension that has been well controlled, hypothyroidism that has been well controlled, hyperlipidemia well-controlled,  type 2 diabetes that has not been well controlled recently, she has vitamin D deficiency on supplementation, she was recently started on Effexor by her GYN for postmenopausal symptoms after hysterectomy. She also has a history of nonalcoholic steatohepatitis with significant transaminitis. She was followed in [place] by G, has had biopsies. She is needing a local gastroenterologist. She will continue to see her gynecologist in [place]. She has a strong family history of breast cancer and she gets annual mammograms as well as annual breast MRIs.    Past Medical/Surgical History: Past Medical History:  Diagnosis Date  . Allergy   . Asthma    exercise induced - not used inhaler couple of yrs pt reported 10-03-2018  . Blood transfusion without reported diagnosis   . Depression   . DM (diabetes mellitus) ([place])   . GERD (gastroesophageal reflux disease)   . HTN (hypertension)   . Hyperlipidemia   . Hypothyroidism   . NASH (nonalcoholic steatohepatitis)   . Obesity (BMI 30.0-34.9)   . Post menopausal problems   . Sleep apnea    no c-pap  . Transaminitis   . Vitamin D deficiency     Past Surgical History:  Procedure Laterality Date  . ADRENALECTOMY Right   . APPENDECTOMY    . BREAST REDUCTION SURGERY    . CHOLECYSTECTOMY    . HYSTERECTOMY  ABDOMINAL WITH SALPINGECTOMY    . LIVER BIOPSY  2008, 2013   NASH  . TONSILLECTOMY    . UPPER GASTROINTESTINAL ENDOSCOPY      Social History:  reports that she has quit smoking. Her smoking use included cigarettes. She has never used smokeless tobacco. She reports current alcohol use. She reports that she does not use drugs.  Allergies: Allergies  Allergen Reactions  . Spironolactone Hives and Swelling  . Paroxetine Hcl Other (See Comments)    Other reaction(s): Other (comments) WORSENED DEPRESSION. WORSENED DEPRESSION.   Marland Kitchen Codeine Itching and Rash  . Sulfur Rash    Family History:  Family History  Problem Relation Age of Onset  . Breast cancer Mother   . GER disease Father   . Colon cancer Neg Hx   . Esophageal cancer Neg Hx   . Stomach cancer Neg Hx   . Rectal cancer Neg Hx      Current Outpatient Medications:  .  aspirin EC 81 MG tablet, Take 81 mg by mouth daily., Disp: , Rfl:  .  Blood Glucose Monitoring Suppl (CONTOUR NEXT ONE) KIT, by Does not apply route., Disp: , Rfl:  .  Cholecalciferol (VITAMIN D3) 50 MCG (2000 UT) TABS, Take by mouth., Disp: , Rfl:  .  cyclobenzaprine (FLEXERIL) 10 MG tablet, Take 1 tablet (10 mg total) by mouth 3 (three) times daily as needed for muscle spasms., Disp: 30 tablet, Rfl: 0 .  Dulaglutide (TRULICITY) 6.38 GY/6.5LD SOPN, Inject 0.75 mg into the skin once a week., Disp:  4 pen, Rfl: 3 .  Evening Primrose Oil 1000 MG CAPS, Take by mouth., Disp: , Rfl:  .  hydrochlorothiazide (HYDRODIURIL) 25 MG tablet, Take 1 tablet (25 mg total) by mouth daily., Disp: 90 tablet, Rfl: 0 .  levothyroxine (SYNTHROID) 100 MCG tablet, Take 1 tablet (100 mcg total) by mouth daily before breakfast., Disp: 90 tablet, Rfl: 0 .  lisinopril (ZESTRIL) 20 MG tablet, Take 20 mg by mouth daily., Disp: , Rfl:  .  Melatonin 10 MG CAPS, Take by mouth., Disp: , Rfl:  .  metFORMIN (GLUCOPHAGE) 500 MG tablet, Take 2 tabs twice daily, Disp: 360 tablet, Rfl: 0 .   Microlet Lancets MISC, by Does not apply route. Test 3-4 times daily (Micorlet Colored Lancets), Disp: , Rfl:  .  Nutritional Supplements (ESTROVEN EXTRA STRENGTH PO), Take by mouth., Disp: , Rfl:  .  Omega-3 Fatty Acids (FISH OIL) 1000 MG CAPS, Take by mouth., Disp: , Rfl:  .  omeprazole (PRILOSEC) 20 MG capsule, Take 20 mg by mouth daily., Disp: , Rfl:  .  OVER THE COUNTER MEDICATION, Fish Oil, 1200 mg. Two capsules daily., Disp: , Rfl:  .  rosuvastatin (CRESTOR) 10 MG tablet, Take 1 tablet (10 mg total) by mouth daily., Disp: 90 tablet, Rfl: 0 .  venlafaxine (EFFEXOR) 37.5 MG tablet, Take 37.5 mg by mouth 2 (two) times daily., Disp: , Rfl:  .  venlafaxine XR (EFFEXOR-XR) 37.5 MG 24 hr capsule, Take 37.5 mg by mouth daily., Disp: , Rfl:  .  Dulaglutide (TRULICITY) 1.5 ME/2.6ST SOPN, Inject 1.5 mg into the skin once a week., Disp: 4 pen, Rfl: 2  Review of Systems:  Constitutional: Denies fever, chills, diaphoresis, appetite change and fatigue.  HEENT: Denies photophobia, eye pain, redness, hearing loss, ear pain, congestion, sore throat, rhinorrhea, sneezing, mouth sores, trouble swallowing, neck pain, neck stiffness and tinnitus.   Respiratory: Denies SOB, DOE, cough, chest tightness,  and wheezing.   Cardiovascular: Denies chest pain, palpitations and leg swelling.  Gastrointestinal: Denies nausea, vomiting, abdominal pain, diarrhea, constipation, blood in stool and abdominal distention.  Genitourinary: Denies dysuria, urgency, frequency, hematuria, flank pain and difficulty urinating.  Endocrine: Denies: hot or cold intolerance, sweats, changes in hair or nails, polyuria, polydipsia. Musculoskeletal: Denies myalgias, back pain, joint swelling, arthralgias and gait problem.  Skin: Denies pallor, rash and wound.  Neurological: Denies dizziness, seizures, syncope, weakness, light-headedness, numbness and headaches.  Hematological: Denies adenopathy. Easy bruising, personal or family bleeding  history  Psychiatric/Behavioral: Denies suicidal ideation, mood changes, confusion, nervousness, sleep disturbance and agitation    Physical Exam: Vitals:   02/06/19 0732  BP: 110/78  Pulse: 75  Temp: (!) 97 F (36.1 C)  TempSrc: Temporal  SpO2: 97%  Weight: 208 lb 6.4 oz (94.5 kg)    Body mass index is 34.15 kg/m.   Constitutional: NAD, calm, comfortable Eyes: PERRL, lids and conjunctivae normal, wears corrective lenses ENMT: Mucous membranes are moist.  Respiratory: clear to auscultation bilaterally, no wheezing, no crackles. Normal respiratory effort. No accessory muscle use.  Cardiovascular: Regular rate and rhythm, no murmurs / rubs / gallops. No extremity edema. 2+ pedal pulses. No carotid bruits.  Abdomen: Increased abdominal fat, no tenderness, no masses palpated. No hepatosplenomegaly. Bowel sounds positive.  Neurologic: Grossly intact and nonfocal Psychiatric: Normal judgment and insight. Alert and oriented x 3. Normal mood.    Impression and Plan:  Type 2 diabetes mellitus without complication, without long-term current use of insulin ([facility])  -Her last A1c was  8.6 in November 2020. -At that time Trulicity 9.75 was added to Metformin 1000 mg twice daily. -Although it is not yet time for repeat A1c, she brings in her glucometer today.  Her fasting CBGs for the last few weeks: 138, 175, 194, 172, 146, 191, 210, 165, 150, 185. -She has tolerated Trulicity well other than some nausea the first week. -I will increase Trulicity dose to 1.5. -She will follow-up in 3 months   Patient Instructions  -Nice seeing you today!!  -Increase Trulicity to 1.5 mg SQ weekly.  -Schedule a 3 month follow up.     Lelon Frohlich, MD [facility] at [facility]

## 2019-02-16 ENCOUNTER — Ambulatory Visit (INDEPENDENT_AMBULATORY_CARE_PROVIDER_SITE_OTHER): Payer: BC Managed Care – PPO | Admitting: Gastroenterology

## 2019-02-16 ENCOUNTER — Encounter: Payer: Self-pay | Admitting: Gastroenterology

## 2019-02-16 VITALS — BP 130/80 | HR 88 | Temp 98.8°F | Ht 65.0 in | Wt 204.5 lb

## 2019-02-16 DIAGNOSIS — R748 Abnormal levels of other serum enzymes: Secondary | ICD-10-CM

## 2019-02-16 DIAGNOSIS — K7581 Nonalcoholic steatohepatitis (NASH): Secondary | ICD-10-CM | POA: Diagnosis not present

## 2019-02-16 DIAGNOSIS — Z01818 Encounter for other preprocedural examination: Secondary | ICD-10-CM | POA: Diagnosis not present

## 2019-02-16 DIAGNOSIS — R1013 Epigastric pain: Secondary | ICD-10-CM

## 2019-02-16 MED ORDER — OMEPRAZOLE 40 MG PO CPDR: 40.0000 mg | DELAYED_RELEASE_CAPSULE | Freq: Every day | ORAL | 3 refills | Status: DC

## 2019-02-16 NOTE — Patient Instructions (Signed)
Increase Omeprazole to 40 mg every morning.   It has been recommended to you by your physician that you have a(n) endoscopy completed. Per your request, we did not schedule the procedure(s) today. Please contact our office at [phone] should you decide to have the procedure completed. You will be scheduled for a pre-visit and procedure at that time.   Your provider has requested that you go to the basement level for lab work before leaving today. Press "B" on the elevator. The lab is located at the first door on the left as you exit the elevator.  I value your feedback and thank you for entrusting [place] with your care. If you get a [place] patient survey, I would appreciate you taking the time to let us know about your experience today. Thank you!   Due to recent changes in healthcare laws, you may see the results of your imaging and laboratory studies on MyChart before your provider has had a chance to review them.  We understand that in some cases there may be results that are confusing or concerning to you. Not all laboratory results come back in the same time frame and the provider may be waiting for multiple results in order to interpret others.  Please give [place] 48 hours in order for your provider to thoroughly review all the results before contacting the office for clarification of your results.

## 2019-02-16 NOTE — Progress Notes (Addendum)
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 Hyperplastic polyp on colonoscopy 10/03/18 Family history of colon polyps    - Sister adenoma >1.0 cm    - Mother with colon polyps and breast cancer Family history of pancreatic cancer (Maternal uncle and paternal uncle <60) BMI 34  Acute on chronic elevation of liver enzymes:  I have made it clear that I am not a NASH hepatologist. Her hepatologist at [facility] specifically recommended that she follow-up at [facility]. She does not want to travel to [place] to see a hepatologist. Previous evaluation for enzymes attributed to NASH. Will obtain FibroSure to restage inflammation. Will screen for treatable causes of concurrent hepatocellular inflammation. Trulicity is not associated with abnormal liver enzymes.  No new medications to blame.  Abdominal ultrasound recommended to evaluate the hepatic parenchyma. Given her body habitus and expected decrease in sensitivity and specificity, will have a low threshold to pursue cross-sectional imaging with an MRI.   Dyspepsia:  Must exclude dyspepsia due to organic disease such as peptic ulcer disease, gastroesophageal reflux, medications (nonsteroidal anti-inflammatory agents being the most common offender), gastroparesis, carbohydrate malabsorption, celiac, bacterial overgrowth, malignancy (gastric, esophageal, and  pancreatitis), and infections such as Giardia. Differential also includes functional dyspepsia.  EGD with esophageal, gastric, and duodenal biopsies recommended. Trial of omeprazole 40 mg QAM in the meantime.   Family history of polyps: No precancerous polyps on colonoscopy 09/2018. Surveillance colonoscopy recommended in 2025 given the family history.     PLAN: Increase omeprazole to 40 mg QAM EGD with biopsies Hepatic function panel, ANA, IgG, fasting ferritin, iron NASH FibroSURE Abdominal ultrasound Work to maintain a healthy weight and maximize control of diabetes and hyperlipidemia Colonoscopy 2025  The nature of the procedure, as well as the risks, benefits, and alternatives were carefully and thoroughly reviewed with the patient. Ample time for discussion and questions allowed. The patient understood, was satisfied, and agreed to proceed.  I spent 70 minutes of time, including in depth chart review, independent review of results as outlined above, face-to-face time with the patient, coordinating care, ordering studies and medications as appropriate, and documentation.  HPI: Krista Dawson is a 54 y.o. female whom I met 10/03/2018 for open access screening colonoscopy.  A small hyperplastic polyp was removed at that time.  She is now referred by Dr. Jerilee Hoh as [name] for fatty liver.  The history is obtained through the patient, review of her electronic health record, and over 277 pages of medical records from [facility].   Moved to [place] from [place] in March for her work. 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   She has metabolic syndrome X with obesity, diabetes, hypertension, dyslipidemia.  Started Tulicity over the last 6 months and the dose was recently increased. She has hypothyroidism that has been well controlled, vitamin D deficiency on supplementation, reflux  previously on Prilosec and Nexium, hyperaldosteronism  status post right adrenal gland removal in 2011, asthma, sleep apnea, she was recently started on Effexor by her GYN for postmenopausal symptoms after hysterectomy. She has a history of iron deficiency anemia that has resolved.  She has been followed closely by Dr. Concepcion Living, a hepatologist at [facility].  She has been followed in his clinic every 6 months with the last visit 07/18/2018.  She has biopsy-proven nonalcoholic steatohepatitis with abnormal liver enzymes.  She was followed at [facility] where she participated in clinical trials for [facility].  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  A follow-up biopsy in 2011 showed resolution of ballooning and steatosis.  A FibroScan 06/21/2016 showed a median E6.7 and CAP 325, indicating minimal fibrosis and moderate steatosis.  At the time of her last visit 07/18/2018 they were discussing her recently elevated liver enzymes. Her weight was 95.3, height 165.5 for a BMI of 34.79.  He attributed these to weight gain and poor diabetic control.  He noted that she was now living in [place] and suggested that the patient follow-up at [facility] or another [place] participating site.  Her liver enzymes have continued to increase. She feels like she is actually down several pounds since her last visit at [facility]. Weight today is 92.8 pounds. No other new medications other than Trulicity. No herbal, complimentary or Chinese herbal. No acupuncture.   Labs 10/17/2017: AST 31, ALT 60, alk phos 114, total bilirubin 0.2, albumin 4.3, creatinine 0.64, hemoglobin A1c 6.2 Labs 06/10/2018: AST 116, ALT 160, alk phos 94, total bilirubin 0.4, hemoglobin 14, platelets 344, hemoglobin A1c 7.2, TSH 1.79 Labs 07/19/2018: AST 99, ALT 150, alk phos 117, total bilirubin less than 0.2, albumin 4.6, globulin 1.9, glucose 162, creatinine 0.80 Labs 11/28/2018: AST 167, ALT 220, alk phos 99, total bilirubin 0.4, total protein 6.8, glucose 224, creatinine 0.70, hemoglobin 12.9, platelets 269  Primarily concern  today is abdominal discomfort x 2-3 months. Associated bloating, eructation, epigastric fullness and early satiety. Intermittent burning.  She has been awakened by nausea a couple of times since the symptoms started. Occasional early morning nausea. Ginger helps settle. On omeprazole OTC. Symptoms are worse if she misses a dose. She knows that weight is related.  Some concern about malignancy given the unknown.   No NSAIDs. Uses 81 mg QD for prevention as recommended by a prior physician.  Sister with chek2 mutation and a history of an adenoma >1.0 cm. Mother with colon polyps and breast cancer. Maternal uncle and paternal uncle with pancreatic cancer <60. No other known family history of colon cancer or polyps. No family history of uterine/endometrial cancer, pancreatic cancer or gastric/stomach cancer.  She had a prior upper endoscopy in the mid 2000's.   Past Medical History:  Diagnosis Date  . Allergy   . Asthma    exercise induced - not used inhaler couple of yrs pt reported 10-03-2018  . Blood transfusion without reported diagnosis   . Depression   . DM (diabetes mellitus) ([place])   . GERD (gastroesophageal reflux disease)   . HTN (hypertension)   . Hyperlipidemia   . Hypothyroidism   . NASH (nonalcoholic steatohepatitis)   . Obesity (BMI 30.0-34.9)   . Post menopausal problems   . Sleep apnea    no c-pap  . Transaminitis   . Vitamin D deficiency     Past Surgical History:  Procedure Laterality Date  . ADRENALECTOMY Right   . APPENDECTOMY    .  BREAST REDUCTION SURGERY    . CHOLECYSTECTOMY    . HYSTERECTOMY ABDOMINAL WITH SALPINGECTOMY    . LIVER BIOPSY  2008, 2013   NASH  . TONSILLECTOMY    . UPPER GASTROINTESTINAL ENDOSCOPY      Current Outpatient Medications  Medication Sig Dispense Refill  . aspirin EC 81 MG tablet Take 81 mg by mouth daily.    . Blood Glucose Monitoring Suppl (CONTOUR NEXT ONE) KIT by Does not apply route.    . Cholecalciferol (VITAMIN D3) 50  MCG (2000 UT) TABS Take by mouth.    . cyclobenzaprine (FLEXERIL) 10 MG tablet Take 1 tablet (10 mg total) by mouth 3 (three) times daily as needed for muscle spasms. 30 tablet 0  . Dulaglutide (TRULICITY) 1.70 YF/7.4BS SOPN Inject 0.75 mg into the skin once a week. 4 pen 3  . Dulaglutide (TRULICITY) 1.5 WH/6.7RF SOPN Inject 1.5 mg into the skin once a week. 4 pen 2  . Evening Primrose Oil 1000 MG CAPS Take by mouth.    . hydrochlorothiazide (HYDRODIURIL) 25 MG tablet Take 1 tablet (25 mg total) by mouth daily. 90 tablet 0  . levothyroxine (SYNTHROID) 100 MCG tablet Take 1 tablet (100 mcg total) by mouth daily before breakfast. 90 tablet 0  . lisinopril (ZESTRIL) 20 MG tablet Take 20 mg by mouth daily.    . Melatonin 10 MG CAPS Take by mouth.    . metFORMIN (GLUCOPHAGE) 500 MG tablet Take 2 tabs twice daily 360 tablet 0  . Microlet Lancets MISC by Does not apply route. Test 3-4 times daily (Micorlet Colored Lancets)    . Nutritional Supplements (ESTROVEN EXTRA STRENGTH PO) Take by mouth.    . Omega-3 Fatty Acids (FISH OIL) 1000 MG CAPS Take by mouth.    Marland Kitchen omeprazole (PRILOSEC) 20 MG capsule Take 20 mg by mouth daily.    Marland Kitchen OVER THE COUNTER MEDICATION Fish Oil, 1200 mg. Two capsules daily.    . rosuvastatin (CRESTOR) 10 MG tablet Take 1 tablet (10 mg total) by mouth daily. 90 tablet 0  . venlafaxine (EFFEXOR) 37.5 MG tablet Take 37.5 mg by mouth 2 (two) times daily.    Marland Kitchen venlafaxine XR (EFFEXOR-XR) 37.5 MG 24 hr capsule Take 37.5 mg by mouth daily.     No current facility-administered medications for this visit.    Allergies as of 02/16/2019 - Review Complete 02/06/2019  Allergen Reaction Noted  . Spironolactone Hives and Swelling 06/23/2009  . Paroxetine hcl Other (See Comments) 11/09/2013  . Codeine Itching and Rash 07/25/2007  . Sulfur Rash 08/29/2018    Family History  Problem Relation Age of Onset  . Breast cancer Mother   . GER disease Father   . Colon cancer Neg Hx   .  Esophageal cancer Neg Hx   . Stomach cancer Neg Hx   . Rectal cancer Neg Hx     Social History   Socioeconomic History  . Marital status: Single    Spouse name: Not on file  . Number of children: Not on file  . Years of education: Not on file  . Highest education level: Not on file  Occupational History  . Not on file  Tobacco Use  . Smoking status: Former Smoker    Types: Cigarettes  . Smokeless tobacco: Never Used  . Tobacco comment: quit early 2000 per pt  Substance and Sexual Activity  . Alcohol use: Yes    Comment: occasional  . Drug use: Never  . Sexual activity:  Not on file    Comment: post menopausal  Other Topics Concern  . Not on file  Social History Narrative  . Not on file   Social Determinants of Health   Financial Resource Strain:   . Difficulty of Paying Living Expenses: Not on file  Food Insecurity:   . Worried About Charity fundraiser in the Last Year: Not on file  . Ran Out of Food in the Last Year: Not on file  Transportation Needs:   . Lack of Transportation (Medical): Not on file  . Lack of Transportation (Non-Medical): Not on file  Physical Activity:   . Days of Exercise per Week: Not on file  . Minutes of Exercise per Session: Not on file  Stress:   . Feeling of Stress : Not on file  Social Connections:   . Frequency of Communication with Friends and Family: Not on file  . Frequency of Social Gatherings with Friends and Family: Not on file  . Attends Religious Services: Not on file  . Active Member of Clubs or Organizations: Not on file  . Attends Archivist Meetings: Not on file  . Marital Status: Not on file  Intimate Partner Violence:   . Fear of Current or Ex-Partner: Not on file  . Emotionally Abused: Not on file  . Physically Abused: Not on file  . Sexually Abused: Not on file     Physical Exam: General:   Alert, in NAD. Appears her stated age.  HEENT: No scleral icterus. No bilateral temporal wasting. No  thyromegaly.  Heart:  Regular rate and rhythm; no murmurs Pulm: Clear anteriorly; no wheezing Abdomen:  Soft. Central obesity. Nontender. Nondistended. Normal bowel sounds. No rebound or guarding. No hepatosplenomegaly.  No fluid wave.  LAD: No inguinal or umbilical LAD Extremities:  Without edema. No boney abnormalities.  Neurologic:  Alert and  oriented x4;  grossly normal neurologically; no asterixis or clonus. Skin: No jaundice. No palmar erythema or spider angioma. No Terry's nails.  Psych:  Alert and cooperative. Normal mood and affect.    Leafy Motsinger L. Tarri Glenn, MD, MPH 02/16/2019, 8:30 AM

## 2019-02-17 NOTE — Addendum Note (Signed)
Addended by: Wyline Beady on: 02/17/2019 10:31 AM   Modules accepted: Orders

## 2019-02-18 ENCOUNTER — Other Ambulatory Visit (INDEPENDENT_AMBULATORY_CARE_PROVIDER_SITE_OTHER): Payer: BC Managed Care – PPO

## 2019-02-18 DIAGNOSIS — R1013 Epigastric pain: Secondary | ICD-10-CM

## 2019-02-18 DIAGNOSIS — K7581 Nonalcoholic steatohepatitis (NASH): Secondary | ICD-10-CM | POA: Diagnosis not present

## 2019-02-18 DIAGNOSIS — R748 Abnormal levels of other serum enzymes: Secondary | ICD-10-CM

## 2019-02-18 LAB — HEPATIC FUNCTION PANEL
ALT: 122 U/L — ABNORMAL HIGH (ref 0–35)
AST: 78 U/L — ABNORMAL HIGH (ref 0–37)
Albumin: 4.4 g/dL (ref 3.5–5.2)
Alkaline Phosphatase: 89 U/L (ref 39–117)
Bilirubin, Direct: 0.1 mg/dL (ref 0.0–0.3)
Total Bilirubin: 0.4 mg/dL (ref 0.2–1.2)
Total Protein: 6.8 g/dL (ref 6.0–8.3)

## 2019-02-18 LAB — IRON: Iron: 61 ug/dL (ref 42–145)

## 2019-02-18 LAB — FERRITIN: Ferritin: 159.7 ng/mL (ref 10.0–291.0)

## 2019-02-19 LAB — ANA: Anti Nuclear Antibody (ANA): NEGATIVE

## 2019-02-19 LAB — IGG: IgG (Immunoglobin G), Serum: 607 mg/dL (ref 600–1640)

## 2019-02-20 LAB — NASH FIBROSURE
ALPHA 2-MACROGLOBULINS, QN: 187 mg/dL (ref 110–276)
ALT (SGPT) P5P: 154 IU/L — ABNORMAL HIGH (ref 0–40)
AST (SGOT) P5P: 103 IU/L — ABNORMAL HIGH (ref 0–40)
Apolipoprotein A-1: 131 mg/dL (ref 116–209)
Bilirubin, Total: 0.2 mg/dL (ref 0.0–1.2)
Cholesterol, Total: 114 mg/dL (ref 100–199)
Fibrosis Score: 0.14 (ref 0.00–0.21)
GGT: 46 IU/L (ref 0–60)
Glucose: 173 mg/dL — ABNORMAL HIGH (ref 65–99)
Haptoglobin: 100 mg/dL (ref 33–346)
Height: 65 in
NASH Score: 0.75 — ABNORMAL HIGH
Steatosis Score: 0.83 — ABNORMAL HIGH (ref 0.00–0.30)
Triglycerides: 122 mg/dL (ref 0–149)
Weight: 204 [lb_av]

## 2019-02-20 LAB — HEMOGLOBIN A1C: Hemoglobin A1C: 6.7

## 2019-02-20 LAB — BASIC METABOLIC PANEL: Glucose: 173

## 2019-02-20 LAB — LIPID PANEL
Cholesterol: 114 (ref 0–200)
Triglycerides: 122 (ref 40–160)

## 2019-02-23 ENCOUNTER — Other Ambulatory Visit: Payer: Self-pay

## 2019-02-23 ENCOUNTER — Ambulatory Visit (HOSPITAL_COMMUNITY)
Admission: RE | Admit: 2019-02-23 | Discharge: 2019-02-23 | Disposition: A | Discharge status: Home / Self Care | Payer: BC Managed Care – PPO | Source: Ambulatory Visit | Attending: Gastroenterology | Admitting: Gastroenterology

## 2019-02-23 DIAGNOSIS — K7581 Nonalcoholic steatohepatitis (NASH): Secondary | ICD-10-CM | POA: Diagnosis present

## 2019-02-23 DIAGNOSIS — R748 Abnormal levels of other serum enzymes: Secondary | ICD-10-CM | POA: Diagnosis not present

## 2019-02-23 IMAGING — US US ABDOMEN COMPLETE
1 series · 14 of 25 positions shown · non-contrast
Comparison: None.

CLINICAL DATA: Abnormal liver enzymes

EXAM:
ABDOMEN ULTRASOUND COMPLETE

[Series 1: us abdomen complete · 14 of 68 slices shown]
[im 1/68]
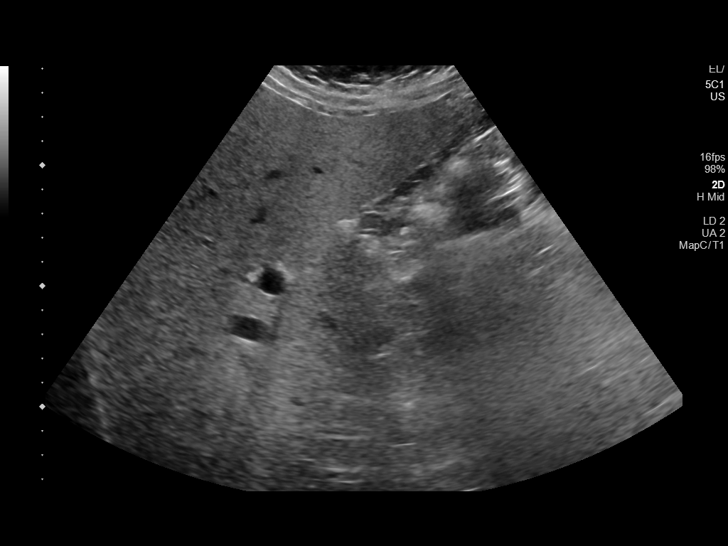
[im 6/68]
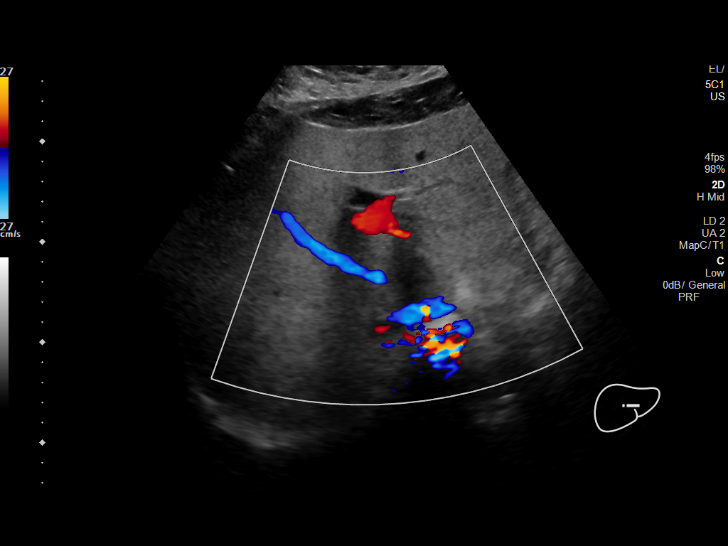
[im 12/68]
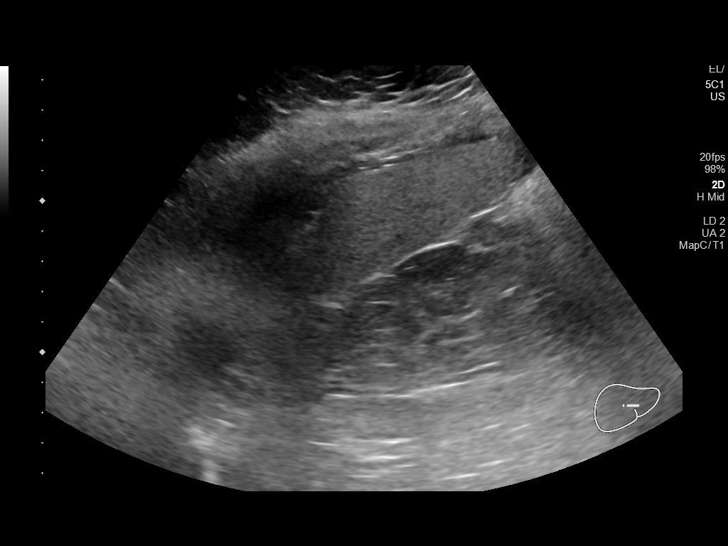
[im 17/68]
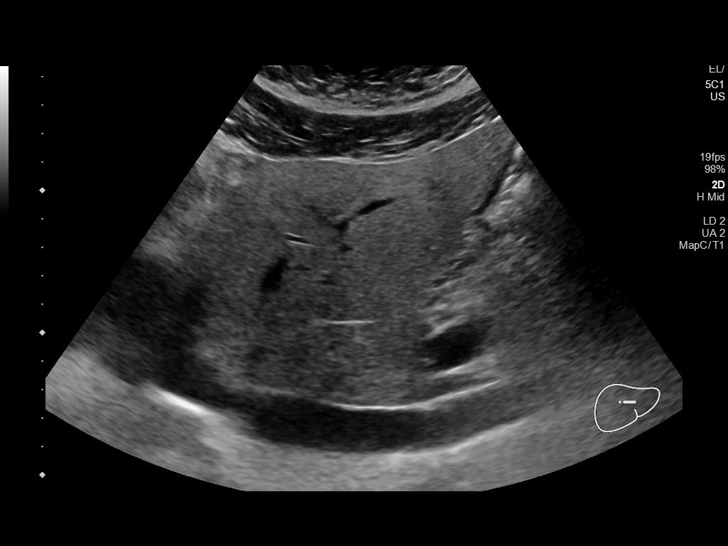
[im 23/68]
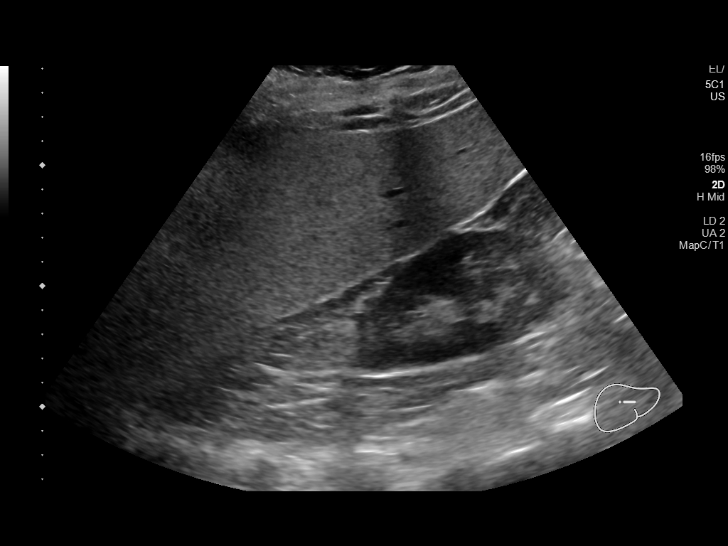
[im 26/68]
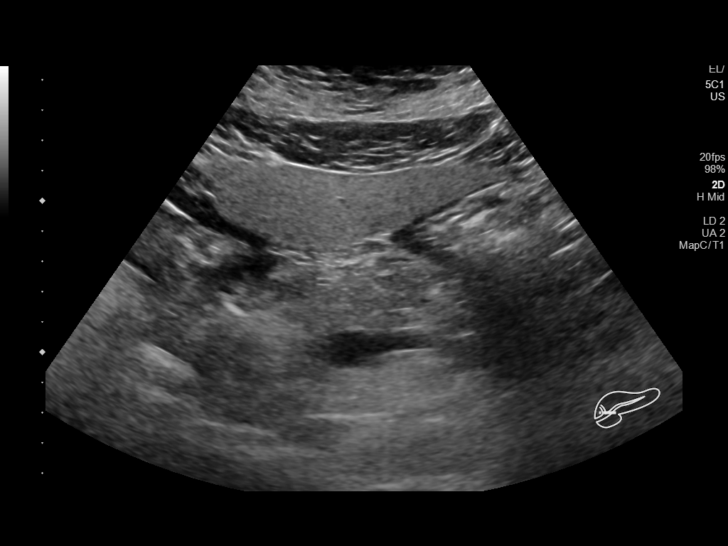
[im 31/68]
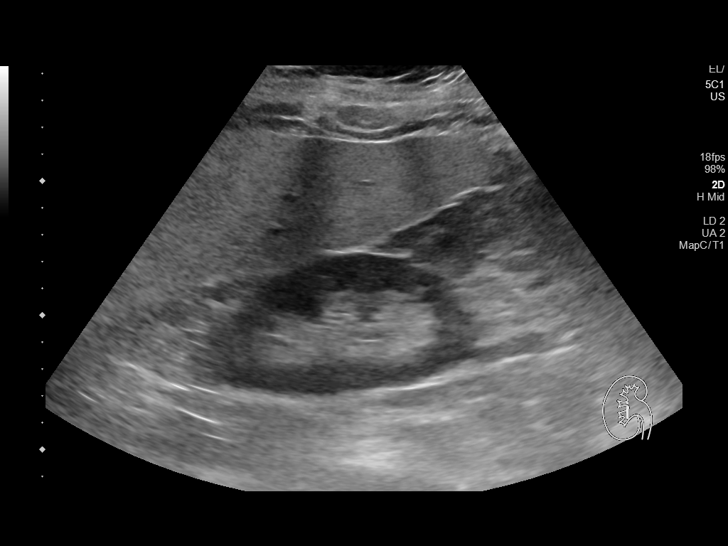
[im 37/68]
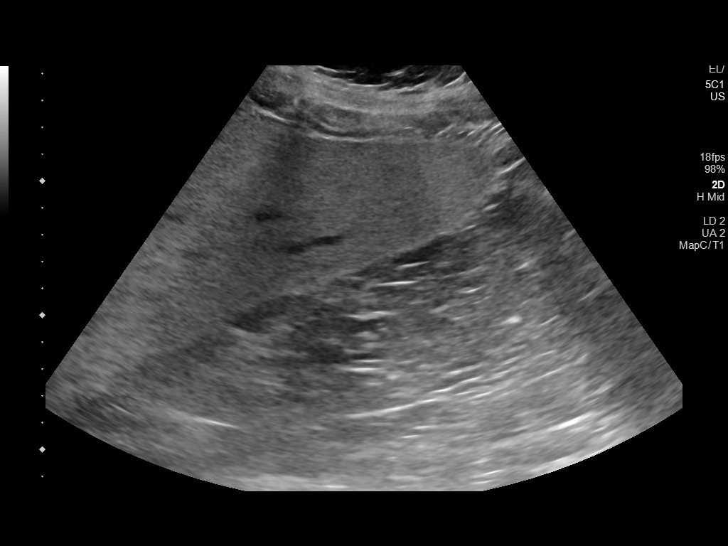
[im 42/68]
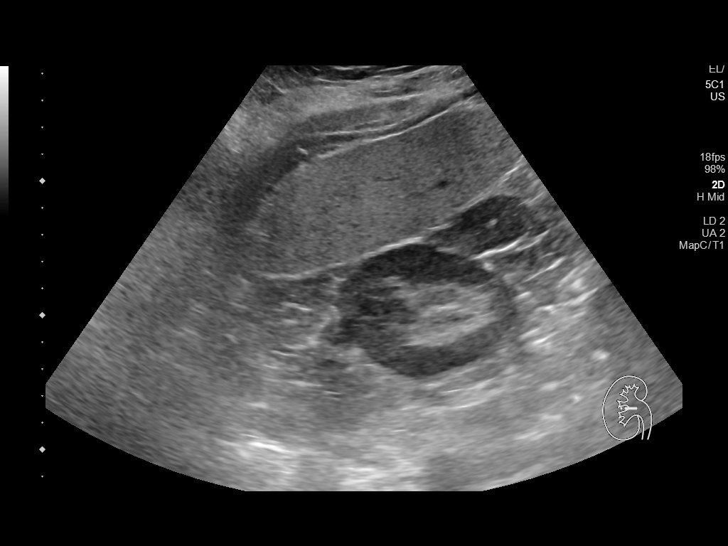
[im 45/68]
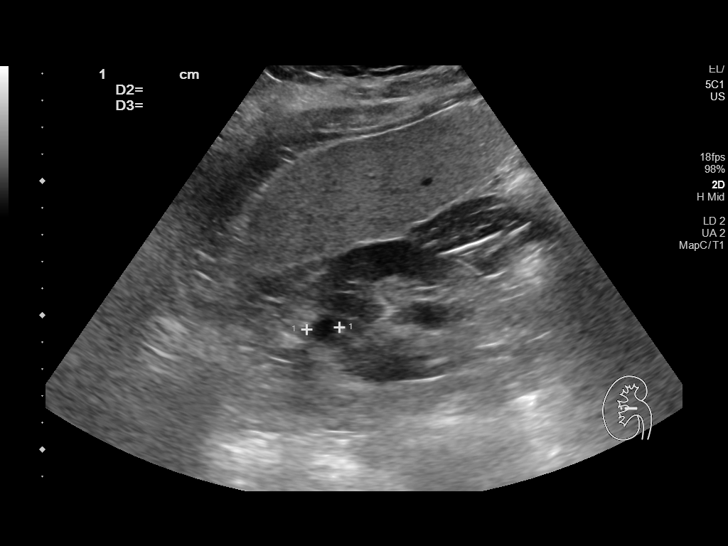
[im 51/68]
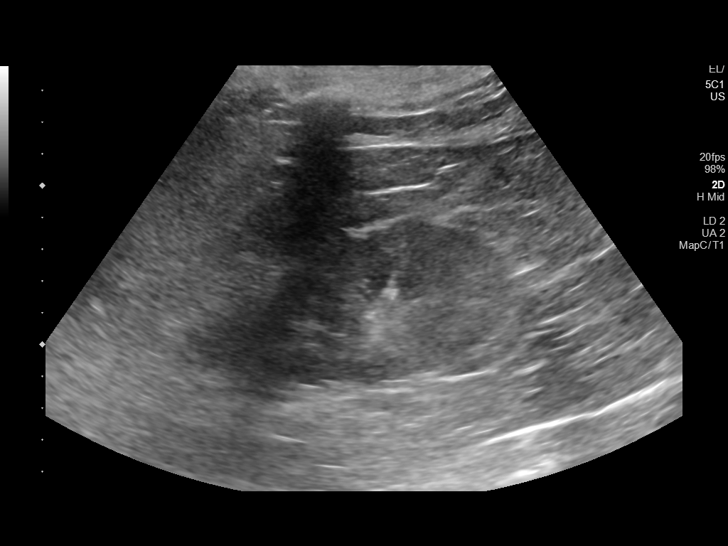
[im 56/68]
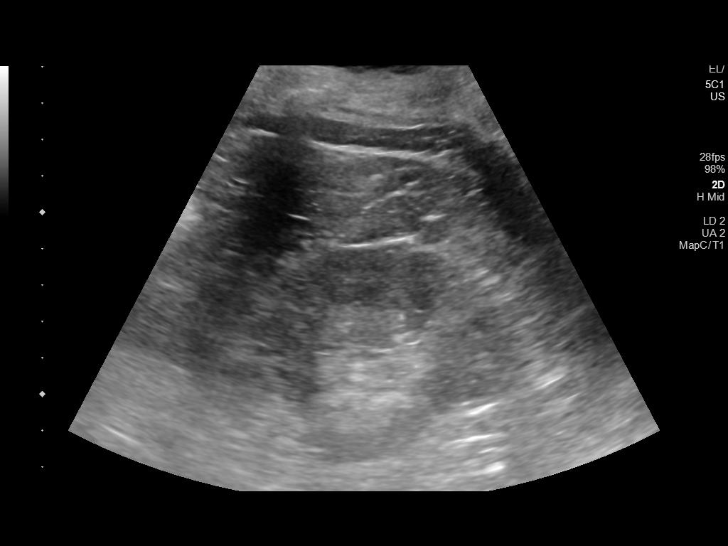
[im 62/68]
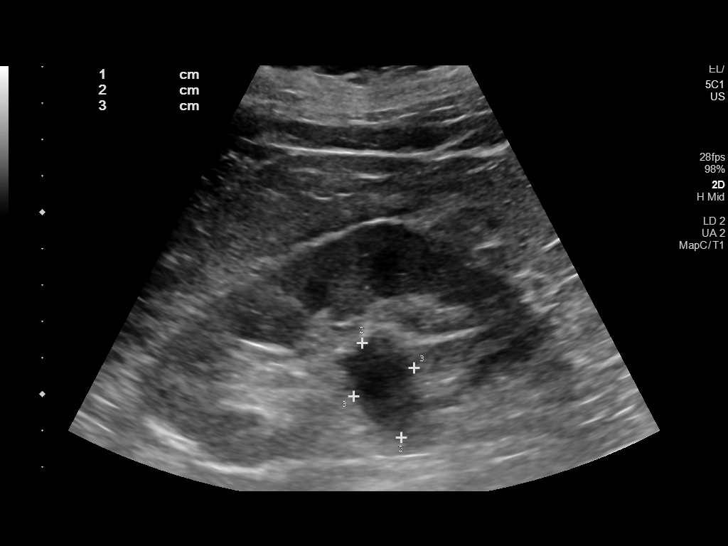
[im 68/68]
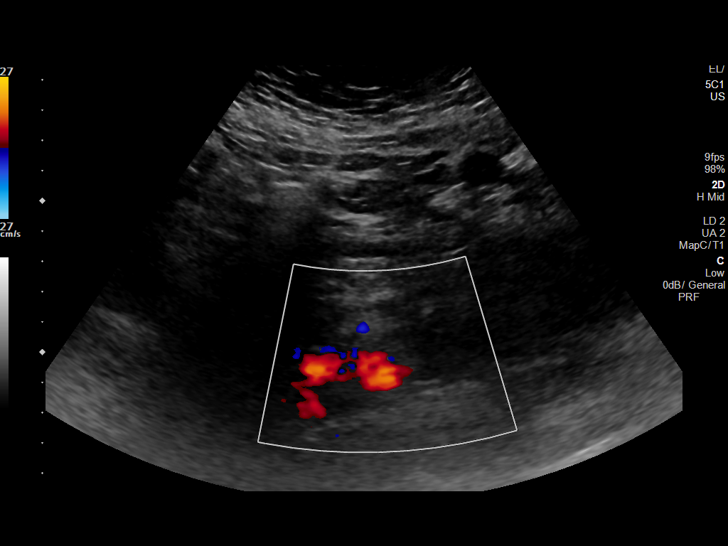

[14 of 25 positions shown; findings below may reference images not displayed]

FINDINGS: Gallbladder: Cholecystectomy.

Common bile duct: Diameter: 5 mm, within normal limits.

Liver: No focal lesion identified. Increased parenchymal
echogenicity. Portal vein is patent on color Doppler imaging with
normal direction of blood flow towards the liver.

IVC: No abnormality visualized.

Pancreas: Not well visualized.

Spleen: Size and appearance within normal limits.

Right Kidney: Length: 10.8 cm. Echogenicity within normal limits.
Small incidental cyst. No hydronephrosis visualized.

Left Kidney: Length: 11.2 cm. Echogenicity within normal limits. No
mass. Probable small extrarenal pelvis. No definite hydronephrosis
visualized.

Abdominal aorta: No aneurysm visualized.

Other findings: None.
IMPRESSION: Increased liver echogenicity likely reflecting steatosis.
Cholecystectomy. Otherwise unremarkable study.

## 2019-02-24 ENCOUNTER — Ambulatory Visit (HOSPITAL_COMMUNITY): Admission: RE | Admit: 2019-02-24 | Payer: BC Managed Care – PPO | Source: Ambulatory Visit

## 2019-02-27 ENCOUNTER — Ambulatory Visit: Payer: BC Managed Care – PPO | Admitting: Internal Medicine

## 2019-03-11 ENCOUNTER — Ambulatory Visit (INDEPENDENT_AMBULATORY_CARE_PROVIDER_SITE_OTHER): Payer: BC Managed Care – PPO

## 2019-03-11 ENCOUNTER — Other Ambulatory Visit: Payer: Self-pay | Admitting: Gastroenterology

## 2019-03-11 ENCOUNTER — Encounter: Payer: Self-pay | Admitting: Gastroenterology

## 2019-03-11 DIAGNOSIS — Z1159 Encounter for screening for other viral diseases: Secondary | ICD-10-CM

## 2019-03-12 LAB — SARS CORONAVIRUS 2 (TAT 6-24 HRS): SARS Coronavirus 2: NEGATIVE

## 2019-03-13 ENCOUNTER — Ambulatory Visit (AMBULATORY_SURGERY_CENTER): Payer: BC Managed Care – PPO | Admitting: Gastroenterology

## 2019-03-13 ENCOUNTER — Other Ambulatory Visit: Payer: Self-pay

## 2019-03-13 ENCOUNTER — Encounter: Payer: Self-pay | Admitting: Gastroenterology

## 2019-03-13 ENCOUNTER — Other Ambulatory Visit: Payer: Self-pay | Admitting: *Deleted

## 2019-03-13 ENCOUNTER — Telehealth: Payer: Self-pay | Admitting: *Deleted

## 2019-03-13 VITALS — BP 118/76 | HR 70 | Temp 96.8°F | Resp 15 | Ht 65.0 in | Wt 204.0 lb

## 2019-03-13 DIAGNOSIS — R110 Nausea: Secondary | ICD-10-CM

## 2019-03-13 DIAGNOSIS — K228 Other specified diseases of esophagus: Secondary | ICD-10-CM

## 2019-03-13 DIAGNOSIS — R6881 Early satiety: Secondary | ICD-10-CM

## 2019-03-13 DIAGNOSIS — R1013 Epigastric pain: Secondary | ICD-10-CM

## 2019-03-13 DIAGNOSIS — K229 Disease of esophagus, unspecified: Secondary | ICD-10-CM

## 2019-03-13 DIAGNOSIS — K2970 Gastritis, unspecified, without bleeding: Secondary | ICD-10-CM

## 2019-03-13 DIAGNOSIS — K3189 Other diseases of stomach and duodenum: Secondary | ICD-10-CM | POA: Diagnosis not present

## 2019-03-13 DIAGNOSIS — K2950 Unspecified chronic gastritis without bleeding: Secondary | ICD-10-CM

## 2019-03-13 DIAGNOSIS — R140 Abdominal distension (gaseous): Secondary | ICD-10-CM

## 2019-03-13 MED ORDER — SODIUM CHLORIDE 0.9 % IV SOLN: 500.0000 mL | Freq: Once | INTRAVENOUS | Status: DC

## 2019-03-13 NOTE — Telephone Encounter (Signed)
-----   Message from Thornton Park, MD sent at 03/13/2019  3:20 PM EST ----- Please schedule gastric emptying scan and then follow-up with me 1-2 weeks later.  Thanks.  KLB

## 2019-03-13 NOTE — Progress Notes (Signed)
PT taken to PACU. Monitors in place. VSS. Report given to RN. 

## 2019-03-13 NOTE — Telephone Encounter (Signed)
Patient scheduled for the following:   03/27/19 at 11:00 am (GES at [facility], arrive 30 min prior, hold stomach meds 6 hours prior and NPO)  04/14/19 at 10:30 am follow up with Dr. Brantley Persons, I will send you a staff message concerning this patient.

## 2019-03-13 NOTE — Patient Instructions (Signed)
YOU HAD AN ENDOSCOPIC PROCEDURE TODAY AT THE [facility]:   Refer to the procedure report that was given to you for any specific questions about what was found during the examination.  If the procedure report does not answer your questions, please call your gastroenterologist to clarify.  If you requested that your care partner not be given the details of your procedure findings, then the procedure report has been included in a sealed envelope for you to review at your convenience later.  YOU SHOULD EXPECT: Some feelings of bloating in the abdomen. Passage of more gas than usual.  Walking can help get rid of the air that was put into your GI tract during the procedure and reduce the bloating. If you had a lower endoscopy (such as a colonoscopy or flexible sigmoidoscopy) you may notice spotting of blood in your stool or on the toilet paper. If you underwent a bowel prep for your procedure, you may not have a normal bowel movement for a few days.  Please Note:  You might notice some irritation and congestion in your nose or some drainage.  This is from the oxygen used during your procedure.  There is no need for concern and it should clear up in a day or so.  SYMPTOMS TO REPORT IMMEDIATELY:    Following upper endoscopy (EGD)  Vomiting of blood or coffee ground material  New chest pain or pain under the shoulder blades  Painful or persistently difficult swallowing  New shortness of breath  Fever of 100F or higher  Black, tarry-looking stools  For urgent or emergent issues, a gastroenterologist can be reached at any hour by calling [phone]. Do not use MyChart messaging for urgent concerns.    DIET:  We do recommend a small meal at first, but then you may proceed to your regular diet.  Drink plenty of fluids but you should avoid alcoholic beverages for 24 hours.  ACTIVITY:  You should plan to take it easy for the rest of today and you should NOT DRIVE or use heavy machinery  until tomorrow (because of the sedation medicines used during the test).    FOLLOW UP: Our staff will call the number listed on your records 48-72 hours following your procedure to check on you and address any questions or concerns that you may have regarding the information given to you following your procedure. If we do not reach you, we will leave a message.  We will attempt to reach you two times.  During this call, we will ask if you have developed any symptoms of COVID 19. If you develop any symptoms (ie: fever, flu-like symptoms, shortness of breath, cough etc.) before then, please call [phone].  If you test positive for Covid 19 in the 2 weeks post procedure, please call and report this information to us.    If any biopsies were taken you will be contacted by phone or by letter within the next 1-3 weeks.  Please call us at [phone] if you have not heard about the biopsies in 3 weeks.    SIGNATURES/CONFIDENTIALITY: You and/or your care partner have signed paperwork which will be entered into your electronic medical record.  These signatures attest to the fact that that the information above on your After Visit Summary has been reviewed and is understood.  Full responsibility of the confidentiality of this discharge information lies with you and/or your care-partner. 

## 2019-03-13 NOTE — Progress Notes (Signed)
Called to room to assist during endoscopic procedure.  Patient ID and intended procedure confirmed with present staff. Received instructions for my participation in the procedure from the performing physician.  

## 2019-03-13 NOTE — Op Note (Signed)
[facility] Patient Name: Krista Dawson Procedure Date: 03/13/2019 3:01 PM MRN: [id number] Endoscopist: Thornton Park MD, MD Age: 54 Referring MD:  Date of Birth: [birth year] Gender: Female Account #: [id number] Procedure:                Upper GI endoscopy Indications:              Dyspepsia                           - abdominal discomfort x 2-3 months                           - associated bloating, eructation, epigastric                            fullness, nausea and early satiety Medicines:                Monitored Anesthesia Care Procedure:                Pre-Anesthesia Assessment:                           - Prior to the procedure, a History and Physical                            was performed, and patient medications and                            allergies were reviewed. The patient's tolerance of                            previous anesthesia was also reviewed. The risks                            and benefits of the procedure and the sedation                            options and risks were discussed with the patient.                            All questions were answered, and informed consent                            was obtained. Prior Anticoagulants: The patient has                            taken no previous anticoagulant or antiplatelet                            agents. ASA Grade Assessment: II - A patient with                            mild systemic disease. After reviewing the risks  and benefits, the patient was deemed in                            satisfactory condition to undergo the procedure.                           After obtaining informed consent, the endoscope was                            passed under direct vision. Throughout the                            procedure, the patient's blood pressure, pulse, and                            oxygen saturations were monitored continuously. The        Endoscope was introduced through the mouth, and                            advanced to the third part of duodenum. The upper                            GI endoscopy was accomplished without difficulty.                            The patient tolerated the procedure well. Scope In: Scope Out: Findings:                 A single small mucosal nodule was found in the                            upper third of the esophagus. This appears benign.                            Biopsies were taken with a cold forceps for                            histology. Estimated blood loss was minimal.                           The examined esophagus was normal. Biopsies were                            obtained from the proximal and distal esophagus                            with cold forceps for histology of suspected                            eosinophilic esophagitis.                           A medium amount of food (residue) was found in the  gastric fundus, in the gastric body and in the                            gastric antrum.                           The entire examined stomach was normal. Biopsies                            were taken from the antrum, body, and fundus with a                            cold forceps for histology. Estimated blood loss                            was minimal.                           Food (residue) was found in the duodenal bulb.                            Biopsies were taken with a cold forceps for                            histology. Estimated blood loss was minimal.                           The cardia and gastric fundus were normal on                            retroflexion. Complications:            No immediate complications. Estimated blood loss:                            Minimal. Estimated Blood Loss:     Estimated blood loss was minimal. Impression:               - Mucosal nodule found in the esophagus. Biopsied.                            - Normal esophagus. Biopsied.                           - A medium amount of food (residue) in the stomach.                           - Normal stomach. Biopsied.                           - Retained food in the duodenum. Recommendation:           - Patient has a contact number available for                            emergencies. The signs and symptoms of potential  delayed complications were discussed with the                            patient. Return to normal activities tomorrow.                            Written discharge instructions were provided to the                            patient.                           - Resume previous diet.                           - Continue present medications. I did not change                            your medications today.                           - Await pathology results.                           - Proceed with gastric emptying scan to evaluate                            for gastroparesis.                           - Follow-up in the office after the gastric                            emptying scan. Thornton Park MD, MD 03/13/2019 3:31:00 PM This report has been signed electronically.

## 2019-03-13 NOTE — Progress Notes (Signed)
DT- Vitals JB- Temp

## 2019-03-16 NOTE — Telephone Encounter (Signed)
Patient called in reference to appts

## 2019-03-16 NOTE — Telephone Encounter (Signed)
Patient informed of appointments and instructions.

## 2019-03-17 ENCOUNTER — Telehealth: Payer: Self-pay | Admitting: *Deleted

## 2019-03-17 NOTE — Telephone Encounter (Signed)
  Follow up Call-  Call back number 03/13/2019 10/03/2018  Post procedure Call Back phone  # [phone] cell  Permission to leave phone message Yes Yes     Patient questions:  Do you have a fever, pain , or abdominal swelling? No. Pain Score  0 *  Have you tolerated food without any problems? Yes.    Have you been able to return to your normal activities? Yes.    Do you have any questions about your discharge instructions: Diet   No. Medications  No. Follow up visit  No.  Do you have questions or concerns about your Care? No.  Actions: * If pain score is 4 or above: No action needed, pain <4  1. Have you developed a fever since your procedure? NO  2.   Have you had an respiratory symptoms (SOB or cough) since your procedure? NO  3.   Have you tested positive for COVID 19 since your procedure NO  4.   Have you had any family members/close contacts diagnosed with the COVID 19 since your procedure?  NO   If yes to any of these questions please route to Joylene John, RN and Alphonsa Gin, RN.

## 2019-03-17 NOTE — Telephone Encounter (Signed)
  Follow up Call-  Call back number 03/13/2019 10/03/2018  Post procedure Call Back phone  # [phone] cell  Permission to leave phone message Yes Yes     Patient questions:  Message left to call us if necessary.

## 2019-03-27 ENCOUNTER — Other Ambulatory Visit: Payer: Self-pay

## 2019-03-27 ENCOUNTER — Encounter (HOSPITAL_COMMUNITY)
Admission: RE | Admit: 2019-03-27 | Discharge: 2019-03-27 | Disposition: A | Discharge status: Home / Self Care | Payer: BC Managed Care – PPO | Source: Ambulatory Visit | Attending: Gastroenterology | Admitting: Gastroenterology

## 2019-03-27 DIAGNOSIS — R140 Abdominal distension (gaseous): Secondary | ICD-10-CM | POA: Insufficient documentation

## 2019-03-27 DIAGNOSIS — R110 Nausea: Secondary | ICD-10-CM | POA: Diagnosis not present

## 2019-03-27 DIAGNOSIS — R6881 Early satiety: Secondary | ICD-10-CM | POA: Diagnosis present

## 2019-03-27 IMAGING — NM NM GASTRIC EMPTYING
5 series · 5 of 5 positions shown · non-contrast
Comparison: None

CLINICAL DATA: Nausea without vomiting, early satiety, bloating,
history diabetes mellitus

EXAM:
NUCLEAR MEDICINE GASTRIC EMPTYING SCAN
TECHNIQUE: After oral ingestion of radiolabeled meal, sequential abdominal
images were obtained for 4 hours. Percentage of activity emptying
the stomach was calculated at 1 hour, 2 hour, 3 hour, and 4 hours.
RADIOPHARMACEUTICALS:  2.0 mCi [PY] sulfur colloid in standardized
meal

[Series 1: 0 min · 4.14mm/px · 1 of 1 slices shown]
[im 1/1]
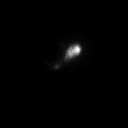

[Series 2: 1 hr · 4.14mm/px · 1 of 1 slices shown]
[im 1/1]
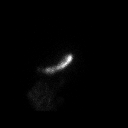

[Series 3: 2 hr · 4.14mm/px · 1 of 1 slices shown]
[im 1/1]
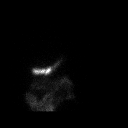

[Series 4: 90 min · 4.14mm/px · 1 of 1 slices shown]
[im 1/1]
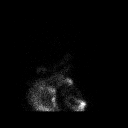

[Series 5: 4 hour · 4.14mm/px · 1 of 1 slices shown]
[im 1/1]
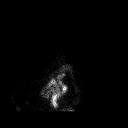

[5 of 5 positions shown; findings below may reference images not displayed]

FINDINGS: Expected location of the stomach in the left upper quadrant.

Ingested meal empties the stomach gradually over the course of the
study.

12% emptied at 1 hr ( normal >= 10%)

44% emptied at 2 hr ( normal >= 40%)

88% emptied at 3 hr ( normal >= 70%)

95% emptied at 4 hr ( normal >= 90%)
IMPRESSION: Normal gastric emptying study.

## 2019-03-27 MED ORDER — TECHNETIUM TC 99M SULFUR COLLOID
2.0000 | Freq: Once | INTRAVENOUS | Status: AC | PRN
  Administered 2019-03-27: 2 via ORAL

## 2019-04-03 ENCOUNTER — Encounter: Payer: Self-pay | Admitting: Internal Medicine

## 2019-04-03 ENCOUNTER — Ambulatory Visit (INDEPENDENT_AMBULATORY_CARE_PROVIDER_SITE_OTHER): Payer: BC Managed Care – PPO | Admitting: Internal Medicine

## 2019-04-03 ENCOUNTER — Other Ambulatory Visit: Payer: Self-pay

## 2019-04-03 ENCOUNTER — Telehealth: Payer: Self-pay | Admitting: Internal Medicine

## 2019-04-03 VITALS — BP 110/80 | HR 92 | Temp 97.8°F | Wt 203.6 lb

## 2019-04-03 DIAGNOSIS — E119 Type 2 diabetes mellitus without complications: Secondary | ICD-10-CM | POA: Diagnosis not present

## 2019-04-03 DIAGNOSIS — R300 Dysuria: Secondary | ICD-10-CM

## 2019-04-03 DIAGNOSIS — N3000 Acute cystitis without hematuria: Secondary | ICD-10-CM

## 2019-04-03 LAB — POCT URINALYSIS DIPSTICK
Bilirubin, UA: NEGATIVE
Blood, UA: NEGATIVE
Glucose, UA: NEGATIVE
Ketones, UA: NEGATIVE
Nitrite, UA: NEGATIVE
Protein, UA: NEGATIVE
Spec Grav, UA: 1.01 (ref 1.010–1.025)
Urobilinogen, UA: 0.2 E.U./dL
pH, UA: 6.5 (ref 5.0–8.0)

## 2019-04-03 LAB — POCT GLYCOSYLATED HEMOGLOBIN (HGB A1C): Hemoglobin A1C: 6.7 % — AB (ref 4.0–5.6)

## 2019-04-03 MED ORDER — CIPROFLOXACIN HCL 500 MG PO TABS: 500.0000 mg | ORAL_TABLET | Freq: Two times a day (BID) | ORAL | 0 refills | Status: AC

## 2019-04-03 MED ORDER — CIPROFLOXACIN HCL 500 MG PO TABS: 500.0000 mg | ORAL_TABLET | Freq: Two times a day (BID) | ORAL | 0 refills | Status: DC

## 2019-04-03 NOTE — Patient Instructions (Signed)
-Nice seeing you today!!  -Start cipro 500 mg twice daily for 5 days.  -Great job on getting your A1c  Controlled.  -See you back in 3 months.   Urinary Tract Infection, Adult A urinary tract infection (UTI) is an infection of any part of the urinary tract. The urinary tract includes:  The kidneys.  The ureters.  The bladder.  The urethra. These organs make, store, and get rid of pee (urine) in the body. What are the causes? This is caused by germs (bacteria) in your genital area. These germs grow and cause swelling (inflammation) of your urinary tract. What increases the risk? You are more likely to develop this condition if:  You have a small, thin tube (catheter) to drain pee.  You cannot control when you pee or poop (incontinence).  You are female, and: ? You use these methods to prevent pregnancy:  A medicine that kills sperm (spermicide).  A device that blocks sperm (diaphragm). ? You have low levels of a female hormone (estrogen). ? You are pregnant.  You have genes that add to your risk.  You are sexually active.  You take antibiotic medicines.  You have trouble peeing because of: ? A prostate that is bigger than normal, if you are female. ? A blockage in the part of your body that drains pee from the bladder (urethra). ? A kidney stone. ? A nerve condition that affects your bladder (neurogenic bladder). ? Not getting enough to drink. ? Not peeing often enough.  You have other conditions, such as: ? Diabetes. ? A weak disease-fighting system (immune system). ? Sickle cell disease. ? Gout. ? Injury of the spine. What are the signs or symptoms? Symptoms of this condition include:  Needing to pee right away (urgently).  Peeing often.  Peeing small amounts often.  Pain or burning when peeing.  Blood in the pee.  Pee that smells bad or not like normal.  Trouble peeing.  Pee that is cloudy.  Fluid coming from the vagina, if you are  female.  Pain in the belly or lower back. Other symptoms include:  Throwing up (vomiting).  No urge to eat.  Feeling mixed up (confused).  Being tired and grouchy (irritable).  A fever.  Watery poop (diarrhea). How is this treated? This condition may be treated with:  Antibiotic medicine.  Other medicines.  Drinking enough water. Follow these instructions at home:  Medicines  Take over-the-counter and prescription medicines only as told by your doctor.  If you were prescribed an antibiotic medicine, take it as told by your doctor. Do not stop taking it even if you start to feel better. General instructions  Make sure you: ? Pee until your bladder is empty. ? Do not hold pee for a long time. ? Empty your bladder after sex. ? Wipe from front to back after pooping if you are a female. Use each tissue one time when you wipe.  Drink enough fluid to keep your pee pale yellow.  Keep all follow-up visits as told by your doctor. This is important. Contact a doctor if:  You do not get better after 1-2 days.  Your symptoms go away and then come back. Get help right away if:  You have very bad back pain.  You have very bad pain in your lower belly.  You have a fever.  You are sick to your stomach (nauseous).  You are throwing up. Summary  A urinary tract infection (UTI) is an infection of  any part of the urinary tract.  This condition is caused by germs in your genital area.  There are many risk factors for a UTI. These include having a small, thin tube to drain pee and not being able to control when you pee or poop.  Treatment includes antibiotic medicines for germs.  Drink enough fluid to keep your pee pale yellow. This information is not intended to replace advice given to you by your health care provider. Make sure you discuss any questions you have with your health care provider. Document Revised: 12/12/2017 Document Reviewed: 07/04/2017 Elsevier  Patient Education  2020 [employer].

## 2019-04-03 NOTE — Telephone Encounter (Signed)
Rx sent 

## 2019-04-03 NOTE — Telephone Encounter (Signed)
Appointment has been rescheduled.  Patient would like to know if a prescription will be sent in for her UTI?

## 2019-04-03 NOTE — Addendum Note (Signed)
Addended by: Erline Hau on: 04/03/2019 04:27 PM   Modules accepted: Orders

## 2019-04-03 NOTE — Addendum Note (Signed)
Addended by: Suzette Battiest on: 04/03/2019 01:13 PM   Modules accepted: Orders

## 2019-04-03 NOTE — Telephone Encounter (Signed)
Patient has a 3 month f/u at the end of April from her January visit.  Her AVS states for her to come back in 3 months so she was wondering if she was supposed to cancel her April appt and make one for June?

## 2019-04-03 NOTE — Telephone Encounter (Signed)
Yes, cipro sent: BID x 5 days

## 2019-04-03 NOTE — Progress Notes (Signed)
Established Patient Office Visit     This visit occurred during the SARS-CoV-2 public health emergency.  Safety protocols were in place, including screening questions prior to the visit, additional usage of staff PPE, and extensive cleaning of exam room while observing appropriate contact time as indicated for disinfecting solutions.    CC/Reason for Visit: Dysuria  HPI: Krista Dawson is a 55 y.o. female who is coming in today for the above mentioned reasons.  For the past week she has been having increased urinary frequency, dysuria and urinary hesitancy with a sensation of incomplete bladder emptying and pressure.  She denies fever.  She had her first Covid vaccine since I last saw her.  She is otherwise doing well.  She is increased her Trulicity to 1.5, states her CBGs have improved.   Past Medical/Surgical History: Past Medical History:  Diagnosis Date  . Allergy   . Asthma    exercise induced - not used inhaler couple of yrs pt reported 10-03-2018  . Blood transfusion without reported diagnosis   . Colon polyp   . Depression   . DM (diabetes mellitus) ([place])   . GERD (gastroesophageal reflux disease)   . HTN (hypertension)   . Hyperlipidemia   . Hypothyroidism   . NASH (nonalcoholic steatohepatitis)   . Obesity (BMI 30.0-34.9)   . Post menopausal problems   . Sleep apnea    no c-pap  . Transaminitis   . Vitamin D deficiency     Past Surgical History:  Procedure Laterality Date  . ADRENALECTOMY Right   . APPENDECTOMY    . BREAST REDUCTION SURGERY    . CHOLECYSTECTOMY    . HYSTERECTOMY ABDOMINAL WITH SALPINGECTOMY    . LIVER BIOPSY  2008, 2013   [facility]  . TONSILLECTOMY    . UPPER GASTROINTESTINAL ENDOSCOPY      Social History:  reports that she quit smoking about 12 years ago. Her smoking use included cigarettes. She has never used smokeless tobacco. She reports current alcohol use. She reports that she does not use drugs.  Allergies: Allergies   Allergen Reactions  . Spironolactone Hives and Swelling  . Paxil [Paroxetine Hcl] Other (See Comments)    Other reaction(s): Other (comments) WORSENED DEPRESSION. WORSENED DEPRESSION.   Marland Kitchen Codeine Itching and Rash  . Sulfur Rash    Family History:  Family History  Problem Relation Age of Onset  . Breast cancer Mother   . Colon polyps Mother   . Stroke Mother 82  . GER disease Father   . Diabetes Father   . Kidney disease Father   . CAD Father   . Colon polyps Sister   . Stroke Sister   . Diabetes Maternal Grandfather   . Heart disease Maternal Grandfather   . Diabetes Paternal Grandmother   . CAD Paternal Grandfather   . Pancreatic cancer Paternal Uncle   . Pancreatic cancer Maternal Uncle   . Colon cancer Neg Hx   . Esophageal cancer Neg Hx   . Stomach cancer Neg Hx   . Rectal cancer Neg Hx      Current Outpatient Medications:  .  aspirin EC 81 MG tablet, Take 81 mg by mouth daily., Disp: , Rfl:  .  Blood Glucose Monitoring Suppl (CONTOUR NEXT ONE) KIT, by Does not apply route., Disp: , Rfl:  .  Cholecalciferol (VITAMIN D3) 50 MCG (2000 UT) TABS, Take by mouth., Disp: , Rfl:  .  cyclobenzaprine (FLEXERIL) 10 MG tablet, Take 1 tablet (  10 mg total) by mouth 3 (three) times daily as needed for muscle spasms., Disp: 30 tablet, Rfl: 0 .  Dulaglutide (TRULICITY) 1.5 ZG/0.1VC SOPN, Inject 1.5 mg into the skin once a week., Disp: 4 pen, Rfl: 2 .  Evening Primrose Oil 1000 MG CAPS, Take by mouth., Disp: , Rfl:  .  hydrochlorothiazide (HYDRODIURIL) 25 MG tablet, Take 1 tablet (25 mg total) by mouth daily., Disp: 90 tablet, Rfl: 0 .  levothyroxine (SYNTHROID) 100 MCG tablet, Take 1 tablet (100 mcg total) by mouth daily before breakfast., Disp: 90 tablet, Rfl: 0 .  lisinopril (ZESTRIL) 20 MG tablet, Take 20 mg by mouth daily., Disp: , Rfl:  .  Melatonin 10 MG CAPS, Take by mouth., Disp: , Rfl:  .  metFORMIN (GLUCOPHAGE) 500 MG tablet, Take 2 tabs twice daily, Disp: 360 tablet,  Rfl: 0 .  Microlet Lancets MISC, by Does not apply route. Test 3-4 times daily (Micorlet Colored Lancets), Disp: , Rfl:  .  Nutritional Supplements (ESTROVEN EXTRA STRENGTH PO), Take by mouth., Disp: , Rfl:  .  Omega-3 Fatty Acids (FISH OIL) 1000 MG CAPS, Take by mouth., Disp: , Rfl:  .  omeprazole (PRILOSEC) 40 MG capsule, Take 1 capsule (40 mg total) by mouth daily., Disp: 90 capsule, Rfl: 3 .  rosuvastatin (CRESTOR) 10 MG tablet, Take 1 tablet (10 mg total) by mouth daily., Disp: 90 tablet, Rfl: 0 .  venlafaxine XR (EFFEXOR-XR) 37.5 MG 24 hr capsule, Take 37.5 mg by mouth daily., Disp: , Rfl:   Review of Systems:  Constitutional: Denies fever, chills, diaphoresis, appetite change and fatigue.  HEENT: Denies photophobia, eye pain, redness, hearing loss, ear pain, congestion, sore throat, rhinorrhea, sneezing, mouth sores, trouble swallowing, neck pain, neck stiffness and tinnitus.   Respiratory: Denies SOB, DOE, cough, chest tightness,  and wheezing.   Cardiovascular: Denies chest pain, palpitations and leg swelling.  Gastrointestinal: Denies nausea, vomiting, abdominal pain, diarrhea, constipation, blood in stool and abdominal distention.  Genitourinary: Denies  hematuria, flank pain. Endocrine: Denies: hot or cold intolerance, sweats, changes in hair or nails, polyuria, polydipsia. Musculoskeletal: Denies myalgias, back pain, joint swelling, arthralgias and gait problem.  Skin: Denies pallor, rash and wound.  Neurological: Denies dizziness, seizures, syncope, weakness, light-headedness, numbness and headaches.  Hematological: Denies adenopathy. Easy bruising, personal or family bleeding history  Psychiatric/Behavioral: Denies suicidal ideation, mood changes, confusion, nervousness, sleep disturbance and agitation    Physical Exam: Vitals:   04/03/19 0831  BP: 110/80  Pulse: 92  Temp: 97.8 F (36.6 C)  TempSrc: Temporal  SpO2: 96%  Weight: 203 lb 9.6 oz (92.4 kg)    Body mass  index is 33.88 kg/m.   Constitutional: NAD, calm, comfortable Eyes: PERRL, lids and conjunctivae normal, wears corrective lenses ENMT: Mucous membranes are moist. Neurologic: Grossly intact and nonfocal  Psychiatric: Normal judgment and insight. Alert and oriented x 3. Normal mood.    Impression and Plan:  Acute cystitis without hematuria -With symptoms, start Cipro 500 mg twice daily for 5 days empirically. -Await results of urine dipstick and urine culture.  Type 2 diabetes mellitus without complication, without long-term current use of insulin (HCC) -A1c today shows significant improvement from 8.6 in November to 6.7 today.  She remains on Trulicity 1.5, maximum dose Metformin.    Patient Instructions  -Nice seeing you today!!  -Start cipro 500 mg twice daily for 5 days.  -Great job on getting your A1c  Controlled.  -See you back in 3 months.  Urinary Tract Infection, Adult A urinary tract infection (UTI) is an infection of any part of the urinary tract. The urinary tract includes:  The kidneys.  The ureters.  The bladder.  The urethra. These organs make, store, and get rid of pee (urine) in the body. What are the causes? This is caused by germs (bacteria) in your genital area. These germs grow and cause swelling (inflammation) of your urinary tract. What increases the risk? You are more likely to develop this condition if:  You have a small, thin tube (catheter) to drain pee.  You cannot control when you pee or poop (incontinence).  You are female, and: ? You use these methods to prevent pregnancy:  A medicine that kills sperm (spermicide).  A device that blocks sperm (diaphragm). ? You have low levels of a female hormone (estrogen). ? You are pregnant.  You have genes that add to your risk.  You are sexually active.  You take antibiotic medicines.  You have trouble peeing because of: ? A prostate that is bigger than normal, if you are  female. ? A blockage in the part of your body that drains pee from the bladder (urethra). ? A kidney stone. ? A nerve condition that affects your bladder (neurogenic bladder). ? Not getting enough to drink. ? Not peeing often enough.  You have other conditions, such as: ? Diabetes. ? A weak disease-fighting system (immune system). ? Sickle cell disease. ? Gout. ? Injury of the spine. What are the signs or symptoms? Symptoms of this condition include:  Needing to pee right away (urgently).  Peeing often.  Peeing small amounts often.  Pain or burning when peeing.  Blood in the pee.  Pee that smells bad or not like normal.  Trouble peeing.  Pee that is cloudy.  Fluid coming from the vagina, if you are female.  Pain in the belly or lower back. Other symptoms include:  Throwing up (vomiting).  No urge to eat.  Feeling mixed up (confused).  Being tired and grouchy (irritable).  A fever.  Watery poop (diarrhea). How is this treated? This condition may be treated with:  Antibiotic medicine.  Other medicines.  Drinking enough water. Follow these instructions at home:  Medicines  Take over-the-counter and prescription medicines only as told by your doctor.  If you were prescribed an antibiotic medicine, take it as told by your doctor. Do not stop taking it even if you start to feel better. General instructions  Make sure you: ? Pee until your bladder is empty. ? Do not hold pee for a long time. ? Empty your bladder after sex. ? Wipe from front to back after pooping if you are a female. Use each tissue one time when you wipe.  Drink enough fluid to keep your pee pale yellow.  Keep all follow-up visits as told by your doctor. This is important. Contact a doctor if:  You do not get better after 1-2 days.  Your symptoms go away and then come back. Get help right away if:  You have very bad back pain.  You have very bad pain in your lower  belly.  You have a fever.  You are sick to your stomach (nauseous).  You are throwing up. Summary  A urinary tract infection (UTI) is an infection of any part of the urinary tract.  This condition is caused by germs in your genital area.  There are many risk factors for a UTI. These include having a small,  thin tube to drain pee and not being able to control when you pee or poop.  Treatment includes antibiotic medicines for germs.  Drink enough fluid to keep your pee pale yellow. This information is not intended to replace advice given to you by your health care provider. Make sure you discuss any questions you have with your health care provider. Document Revised: 12/12/2017 Document Reviewed: 07/04/2017 Elsevier Patient Education  2020 [place], MD [facility]

## 2019-04-03 NOTE — Telephone Encounter (Signed)
Yes, make appointment for June.

## 2019-04-03 NOTE — Addendum Note (Signed)
Addended by: Westley Hummer B on: 04/03/2019 01:11 PM   Modules accepted: Orders

## 2019-04-04 LAB — URINE CULTURE
MICRO NUMBER:: 10297160
Result:: NO GROWTH
SPECIMEN QUALITY:: ADEQUATE

## 2019-04-13 ENCOUNTER — Telehealth: Payer: Self-pay | Admitting: Internal Medicine

## 2019-04-13 NOTE — Telephone Encounter (Signed)
Spoke with the pt and informed her of the message below.  I offered a virtual visit for Tuesday at 4pm and the pt stated she has an appt with GI at 3pm. Appt scheduled for a virtual visit on Wednesday at 4pm instead per pts preference and she asked that the message be sent to Dr Jerilee Hoh for review.  Message forwarded to PCP.

## 2019-04-13 NOTE — Telephone Encounter (Signed)
Her cx showed no growth so would not send in further antibiotics at this time.   Follow up with primary if symptoms persist.

## 2019-04-13 NOTE — Telephone Encounter (Signed)
The patient has been prescribed Cipro 500 mg twice daily for 5 days for UTI Pt has symptoms of burning and itching; still, need to know what to do?  [phone]

## 2019-04-14 ENCOUNTER — Ambulatory Visit (INDEPENDENT_AMBULATORY_CARE_PROVIDER_SITE_OTHER): Payer: BC Managed Care – PPO | Admitting: Gastroenterology

## 2019-04-14 ENCOUNTER — Ambulatory Visit: Payer: BC Managed Care – PPO | Admitting: Gastroenterology

## 2019-04-14 ENCOUNTER — Encounter: Payer: Self-pay | Admitting: Gastroenterology

## 2019-04-14 ENCOUNTER — Other Ambulatory Visit: Payer: Self-pay | Admitting: Internal Medicine

## 2019-04-14 ENCOUNTER — Other Ambulatory Visit (INDEPENDENT_AMBULATORY_CARE_PROVIDER_SITE_OTHER): Payer: BC Managed Care – PPO

## 2019-04-14 ENCOUNTER — Other Ambulatory Visit: Payer: Self-pay

## 2019-04-14 VITALS — BP 128/80 | HR 80 | Temp 98.7°F | Ht 65.0 in | Wt 207.2 lb

## 2019-04-14 DIAGNOSIS — R140 Abdominal distension (gaseous): Secondary | ICD-10-CM | POA: Diagnosis not present

## 2019-04-14 DIAGNOSIS — R300 Dysuria: Secondary | ICD-10-CM

## 2019-04-14 DIAGNOSIS — R6881 Early satiety: Secondary | ICD-10-CM | POA: Diagnosis not present

## 2019-04-14 DIAGNOSIS — R110 Nausea: Secondary | ICD-10-CM | POA: Diagnosis not present

## 2019-04-14 DIAGNOSIS — R1013 Epigastric pain: Secondary | ICD-10-CM

## 2019-04-14 LAB — POCT URINALYSIS DIPSTICK
Glucose, UA: NEGATIVE
Leukocytes, UA: NEGATIVE
Protein, UA: NEGATIVE
Spec Grav, UA: 1.025 (ref 1.010–1.025)
Urobilinogen, UA: 0.2 E.U./dL
pH, UA: 5.5 (ref 5.0–8.0)

## 2019-04-14 MED ORDER — RIFAXIMIN 550 MG PO TABS: 550.0000 mg | ORAL_TABLET | Freq: Three times a day (TID) | ORAL | 0 refills | Status: AC

## 2019-04-14 MED ORDER — DICYCLOMINE HCL 20 MG PO TABS: 20.0000 mg | ORAL_TABLET | Freq: Three times a day (TID) | ORAL | 3 refills | Status: DC

## 2019-04-14 NOTE — Patient Instructions (Addendum)
Continue Omeprazole 40 mg daily   We have sent the following medications to your pharmacy for you to pick up at your convenience:  Dicyclomine 20 mg four times a day   We have sent your demographic information and a prescription for Xifaxan to [facility]. This pharmacy is able to get medication approved through insurance and get you the lowest copay possible. If you have not heard from them within 1 week, please call our office at [phone] to let us know.   Work to maintain a healthy weight and maximize control of diabetes and cholesterol.   You will be due for a recall colonoscopy in 2025. We will send you a reminder in the mail when it gets closer to that time.

## 2019-04-14 NOTE — Progress Notes (Signed)
Referring Provider: Isaac Dawson, Krista Dawson* Primary Care Physician:  Krista Dawson, Krista Halsted, MD  Chief complaint: Nausea, bloating, early satiety, abnormal liver enzymes  IMPRESSION:  Dyspepsia    - abdominal discomfort x 2-3 months    - associated bloating, eructation, epigastric fullness, nausea and early satiety Acute on chronic elevation of abnormal transaminases  Nonalcoholic fatty liver disease    - liver biopsy in 2007 showed portal fibrosis    - liver biopsy in 2011 showed resolution of ballooning and steatosis    - FibroScan 06/21/2016 showed a median E6.7, CAP 325       - indicating minimal fibrosis and moderate steatosis.    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/2019: F0 fibrosis, S3 steatosis, N2 NASH Hyperplastic polyp on colonoscopy 10/03/18 Family history of colon polyps    - Sister adenoma >1.0 cm, CHEK-2 mutation    - Mother with colon polyps and breast cancer Family history of pancreatic cancer (Maternal uncle and paternal uncle <60) BMI 34  Acute on chronic elevation of liver enzymes: Recent ultrasound and fibrosure showed significant steatosis and associated steatohepatitis with no significant fibrosis.  Recent liver enzymes have improved.  He continues to work towards a healthy weight.  He is aware of dietary and lifestyle recommendations for the long-term management of NASH.  Recommend annual restaging giving the concurrent diabetes.  Dyspepsia: EGD shows both reflux and gastritis.  Duodenal biopsies were normal.  Despite retained food on her EGD her gastric emptying scan shows no gastroparesis.  I continue to wonder if her symptoms may be related to dulaglutide given the temporal association with symptom onset.  Also consider small intestinal bacterial overgrowth, carbohydrate malabsorption, and functional dyspepsia.  Continue omeprazole.  Trial of dicyclomine 20 mg 4 times daily as needed taken before meals and Xifaxan 550 mg 3 times daily for 18 days.  Family history of  polyps: No precancerous polyps on colonoscopy 09/2018. Surveillance colonoscopy recommended in 2025 given the family history.     PLAN: Continue omeprazole 40 mg BID Dicyclomine 20 mg QID  Xifaxan 557m TID x 14 days Work to maintain a healthy weight and maximize control of diabetes and hyperlipidemia Colonoscopy 2025 Follow-up in 2 months, earlier as needed  I spent 40 minutes, including in depth chart review, independent review of results, communicating results with the patient directly, face-to-face time with the patient, coordinating care, ordering studies and medications as appropriate, and documentation.  HPI: CPaisleigh Maroneyis a 54y.o. female whom I met 10/03/2018 for open access screening colonoscopy.  A small hyperplastic polyp was removed at that time.  She is then referred by Dr. HJerilee Hohas ADeniece Reefor fatty liver and elevated liver enzymes. At the time of that consultation, she reported symptoms of dyspepsia. She returns today in scheduled follow-up after her EGD and imaging studies.  The interval history is obtained through the patient and review of her electronic health record.  [place]. 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  Started Tulicity last year. She has hypothyroidism that has been well controlled, vitamin D deficiency on supplementation, reflux previously on Prilosec and Nexium, hyperaldosteronism status post right adrenal gland removal in 2011, asthma, sleep apnea, she was recently started on Effexor by her GYN for postmenopausal symptoms after hysterectomy. She has a history of iron deficiency anemia that has resolved.  She was previously followed  closely by Dr. Concepcion Dawson, a hepatologist at [facility], with the last visit 07/18/2018.  She has biopsy-proven nonalcoholic steatohepatitis with abnormal liver enzymes.  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  A follow-up biopsy in 2011 showed resolution of ballooning and steatosis.  A FibroScan 06/21/2016 showed a median E6.7 and CAP 325, indicating minimal fibrosis and moderate steatosis.  At the time of her last visit there 07/18/2018 they were discussing her recently elevated liver enzymes. Her weight was 95.3, height 165.5 for a BMI of 34.79.  He attributed these to weight gain and poor diabetic control.  He noted that she was now Dawson in [place] and suggested that the patient follow-up at [facility] or another [place] participating site.  Her liver enzymes have continued to increase. She feels like she is actually down several pounds since her last visit at [facility]. Weight today is 92.8 pounds. No other new medications other than Trulicity. No herbal, complimentary or Chinese herbal. No acupuncture.   Labs 10/17/2017: AST 31, ALT 60, alk phos 114, total bilirubin 0.2, albumin 4.3, creatinine 0.64, hemoglobin A1c 6.2 Labs 06/10/2018: AST 116, ALT 160, alk phos 94, total bilirubin 0.4, hemoglobin 14, platelets 344, hemoglobin A1c 7.2, TSH 1.79 Labs 07/19/2018: AST 99, ALT 150, alk phos 117, total bilirubin less than 0.2, albumin 4.6, globulin 1.9, glucose 162, creatinine 0.80 Labs 11/28/2018: AST 167, ALT 220, alk phos 99, total bilirubin 0.4, total protein 6.8, glucose 224, creatinine 0.70, hemoglobin 12.9, platelets 269 Labs 02/18/2019: AST 78, ALT 122, alk phos 89, total bilirubin 0.4, total protein 6.8, glucose 173, iron 61, ferritin 159.7 NASH fibrosure 02/18/2019 shows F0 fibrosis with a fibrosis score of 0.14, S3 steatosis with a steatosis score of 0.83, and  N2 NASH.   She has had several months of abdominal discomfort with associated bloating, eructation, epigastric fullness and early satiety. Intermittent burning.  She has been awakened by nausea a couple of times since the symptoms started. Occasional early morning nausea. Ginger helps. On omeprazole OTC.  Symptoms are worse if she misses a dose. She knows that weight is related.  Some concern about malignancy given the unknown.   No NSAIDs. Uses 81 mg QD for prevention as recommended by a prior physician.  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  Esophageal biopsies showed reflux.  Gastric biopsy showed mild reactive gastropathy and mild chronic gastritis.  There was no H. pylori.  Duodenal biopsies were normal.  Follow-up gastric emptying scan to evaluate for gastroparesis given the retained food was normal.  Also had an abdominal ultrasound 02/23/19 to follow-up on the increase in her liver enzymes showed increased liver echogenicty likely reflecting steatosis. Cholecystectomy.   Today she reports ongoing post-prandial bloating and nausea. Feels bloated within 20 minutues of eating and lasts for hours. No appetite, although sheattributes to the Trulicity. Continues to feel constant abdominal pressure. No eructation. No longer feeling acid reflux. Ginger provides temporarily relief. Also drinking organic tumeric/ginger tea.   Bowel movements are normal.    Past Medical History:  Diagnosis Date  . Allergy   . Asthma    exercise induced - not used inhaler couple of yrs pt reported 10-03-2018  . Blood transfusion without reported diagnosis   . Colon polyp   . Depression   . DM (diabetes mellitus) ([place])   . GERD (gastroesophageal reflux disease)   . HTN (hypertension)   . Hyperlipidemia   . Hypothyroidism   .  NASH (nonalcoholic steatohepatitis)   . Obesity (BMI 30.0-34.9)   . Post menopausal problems   . Sleep apnea    no c-pap  . Transaminitis   . Vitamin D deficiency     Past Surgical History:  Procedure Laterality Date  . ADRENALECTOMY Right   . APPENDECTOMY    . BREAST REDUCTION SURGERY    . CHOLECYSTECTOMY    . HYSTERECTOMY ABDOMINAL WITH SALPINGECTOMY    . LIVER BIOPSY  2008, 2013   NASH  .  TONSILLECTOMY    . UPPER GASTROINTESTINAL ENDOSCOPY      Current Outpatient Medications  Medication Sig Dispense Refill  . aspirin EC 81 MG tablet Take 81 mg by mouth daily.    . Blood Glucose Monitoring Suppl (CONTOUR NEXT ONE) KIT by Does not apply route.    . Cholecalciferol (VITAMIN D3) 50 MCG (2000 UT) TABS Take by mouth.    . cyclobenzaprine (FLEXERIL) 10 MG tablet Take 1 tablet (10 mg total) by mouth 3 (three) times daily as needed for muscle spasms. 30 tablet 0  . Dulaglutide (TRULICITY) 1.5 ER/1.5QM SOPN Inject 1.5 mg into the skin once a week. 4 pen 2  . Evening Primrose Oil 1000 MG CAPS Take by mouth.    . hydrochlorothiazide (HYDRODIURIL) 25 MG tablet Take 1 tablet (25 mg total) by mouth daily. 90 tablet 0  . levothyroxine (SYNTHROID) 100 MCG tablet Take 1 tablet (100 mcg total) by mouth daily before breakfast. 90 tablet 0  . lisinopril (ZESTRIL) 20 MG tablet Take 20 mg by mouth daily.    . Melatonin 10 MG CAPS Take by mouth.    . metFORMIN (GLUCOPHAGE) 500 MG tablet Take 2 tabs twice daily 360 tablet 0  . Microlet Lancets MISC by Does not apply route. Test 3-4 times daily (Micorlet Colored Lancets)    . Nutritional Supplements (ESTROVEN EXTRA STRENGTH PO) Take by mouth.    . Omega-3 Fatty Acids (FISH OIL) 1000 MG CAPS Take by mouth.    Marland Kitchen omeprazole (PRILOSEC) 40 MG capsule Take 1 capsule (40 mg total) by mouth daily. 90 capsule 3  . rosuvastatin (CRESTOR) 10 MG tablet Take 1 tablet (10 mg total) by mouth daily. 90 tablet 0  . venlafaxine XR (EFFEXOR-XR) 37.5 MG 24 hr capsule Take 37.5 mg by mouth daily.     No current facility-administered medications for this visit.    Allergies as of 04/14/2019 - Review Complete 04/14/2019  Allergen Reaction Noted  . Spironolactone Hives and Swelling 06/23/2009  . Paxil [paroxetine hcl] Other (See Comments) 11/09/2013  . Codeine Itching and Rash 07/25/2007  . Sulfur Rash 08/29/2018    Family History  Problem Relation Age of Onset   . Breast cancer Mother   . Colon polyps Mother   . Stroke Mother 32  . GER disease Father   . Diabetes Father   . Kidney disease Father   . CAD Father   . Colon polyps Sister   . Stroke Sister   . Diabetes Maternal Grandfather   . Heart disease Maternal Grandfather   . Diabetes Paternal Grandmother   . CAD Paternal Grandfather   . Pancreatic cancer Paternal Uncle   . Pancreatic cancer Maternal Uncle   . Colon cancer Neg Hx   . Esophageal cancer Neg Hx   . Stomach cancer Neg Hx   . Rectal cancer Neg Hx     Social History   Socioeconomic History  . Marital status: Single    Spouse name: Not  on file  . Number of children: 0  . Years of education: Not on file  . Highest education level: Not on file  Occupational History  . Occupation: Scientist, water quality  Tobacco Use  . Smoking status: Former Smoker    Types: Cigarettes    Quit date: 2009    Years since quitting: 12.2  . Smokeless tobacco: Never Used  . Tobacco comment: quit early 2000 per pt  Substance and Sexual Activity  . Alcohol use: Yes    Comment: occasional  . Drug use: Never  . Sexual activity: Not on file    Comment: post menopausal  Other Topics Concern  . Not on file  Social History Narrative  . Not on file   Social Determinants of Health   Financial Resource Strain:   . Difficulty of Paying Dawson Expenses:   Food Insecurity:   . Worried About Charity fundraiser in the Last Year:   . Arboriculturist in the Last Year:   Transportation Needs:   . Film/video editor (Medical):   Marland Kitchen Lack of Transportation (Non-Medical):   Physical Activity:   . Days of Exercise per Week:   . Minutes of Exercise per Session:   Stress:   . Feeling of Stress :   Social Connections:   . Frequency of Communication with Friends and Family:   . Frequency of Social Gatherings with Friends and Family:   . Attends Religious Services:   . Active Member of Clubs or Organizations:   . Attends Archivist  Meetings:   Marland Kitchen Marital Status:   Intimate Partner Violence:   . Fear of Current or Ex-Partner:   . Emotionally Abused:   Marland Kitchen Physically Abused:   . Sexually Abused:      Physical Exam: General:   Alert, in NAD. Appears her stated age.  HEENT: No scleral icterus. No bilateral temporal wasting. No thyromegaly.  Heart:  Regular rate and rhythm; no murmurs Pulm: Clear anteriorly; no wheezing Abdomen:  Soft. Central obesity. Nontender. Nondistended. Normal bowel sounds. No rebound or guarding. No hepatosplenomegaly.  No fluid wave.  LAD: No inguinal or umbilical LAD Extremities:  Without edema. No boney abnormalities.  Neurologic:  Alert and  oriented x4;  grossly normal neurologically; no asterixis or clonus. Skin: No jaundice. No palmar erythema or spider angioma. No Terry's nails.  Psych:  Alert and cooperative. Normal mood and affect.    Krista Dawson L. Tarri Glenn, MD, MPH 04/14/2019, 3:16 PM

## 2019-04-15 ENCOUNTER — Encounter: Payer: Self-pay | Admitting: Internal Medicine

## 2019-04-15 ENCOUNTER — Telehealth (INDEPENDENT_AMBULATORY_CARE_PROVIDER_SITE_OTHER): Payer: BC Managed Care – PPO | Admitting: Internal Medicine

## 2019-04-15 VITALS — BP 120/80 | Wt 207.0 lb

## 2019-04-15 DIAGNOSIS — B373 Candidiasis of vulva and vagina: Secondary | ICD-10-CM | POA: Diagnosis not present

## 2019-04-15 DIAGNOSIS — B3731 Acute candidiasis of vulva and vagina: Secondary | ICD-10-CM

## 2019-04-15 MED ORDER — FLUCONAZOLE 150 MG PO TABS: 150.0000 mg | ORAL_TABLET | Freq: Once | ORAL | 0 refills | Status: AC

## 2019-04-15 NOTE — Progress Notes (Signed)
Virtual Visit via Video Note  I connected with Krista Dawson on 04/15/19 at  4:00 PM EDT by a video enabled telemedicine application and verified that I am speaking with the correct person using two identifiers.  Location patient: home Location provider: work office Persons participating in the virtual visit: patient, provider  I discussed the limitations of evaluation and management by telemedicine and the availability of in person appointments. The patient expressed understanding and agreed to proceed.   HPI: She was seen on March 26 with symptoms consistent with a UTI and was given Cipro for 5 days.  She continues to have symptoms of urine hesitancy and dysuria and now has noticed significant itching of her vulvar area.  No discharge.  She is not sexually active.  No fever.   ROS: Constitutional: Denies fever, chills, diaphoresis, appetite change and fatigue.  HEENT: Denies photophobia, eye pain, redness, hearing loss, ear pain, congestion, sore throat, rhinorrhea, sneezing, mouth sores, trouble swallowing, neck pain, neck stiffness and tinnitus.   Respiratory: Denies SOB, DOE, cough, chest tightness,  and wheezing.   Cardiovascular: Denies chest pain, palpitations and leg swelling.  Gastrointestinal: Denies nausea, vomiting, abdominal pain, diarrhea, constipation, blood in stool and abdominal distention.  Genitourinary: Denies dysuria, urgency, frequency, hematuria, flank pain and difficulty urinating.  Endocrine: Denies: hot or cold intolerance, sweats, changes in hair or nails, polyuria, polydipsia. Musculoskeletal: Denies myalgias, back pain, joint swelling, arthralgias and gait problem.  Skin: Denies pallor, rash and wound.  Neurological: Denies dizziness, seizures, syncope, weakness, light-headedness, numbness and headaches.  Hematological: Denies adenopathy. Easy bruising, personal or family bleeding history  Psychiatric/Behavioral: Denies suicidal ideation, mood  changes, confusion, nervousness, sleep disturbance and agitation   Past Medical History:  Diagnosis Date  . Allergy   . Asthma    exercise induced - not used inhaler couple of yrs pt reported 10-03-2018  . Blood transfusion without reported diagnosis   . Colon polyp   . Depression   . DM (diabetes mellitus) ([place])   . GERD (gastroesophageal reflux disease)   . HTN (hypertension)   . Hyperlipidemia   . Hypothyroidism   . NASH (nonalcoholic steatohepatitis)   . Obesity (BMI 30.0-34.9)   . Post menopausal problems   . Sleep apnea    no c-pap  . Transaminitis   . Vitamin D deficiency     Past Surgical History:  Procedure Laterality Date  . ADRENALECTOMY Right   . APPENDECTOMY    . BREAST REDUCTION SURGERY    . CHOLECYSTECTOMY    . HYSTERECTOMY ABDOMINAL WITH SALPINGECTOMY    . LIVER BIOPSY  2008, 2013   NASH  . TONSILLECTOMY    . UPPER GASTROINTESTINAL ENDOSCOPY      Family History  Problem Relation Age of Onset  . Breast cancer Mother   . Colon polyps Mother   . Stroke Mother 1  . GER disease Father   . Diabetes Father   . Kidney disease Father   . CAD Father   . Colon polyps Sister   . Stroke Sister   . Diabetes Maternal Grandfather   . Heart disease Maternal Grandfather   . Diabetes Paternal Grandmother   . CAD Paternal Grandfather   . Pancreatic cancer Paternal Uncle   . Pancreatic cancer Maternal Uncle   . Colon cancer Neg Hx   . Esophageal cancer Neg Hx   . Stomach cancer Neg Hx   . Rectal cancer Neg Hx     SOCIAL HX:  reports that she quit smoking about 12 years ago. Her smoking use included cigarettes. She has never used smokeless tobacco. She reports current alcohol use. She reports that she does not use drugs.   Current Outpatient Medications:  .  aspirin EC 81 MG tablet, Take 81 mg by mouth daily., Disp: , Rfl:  .  Blood Glucose Monitoring Suppl (CONTOUR NEXT ONE) KIT, by Does not apply route., Disp: , Rfl:  .  Cholecalciferol (VITAMIN D3)  50 MCG (2000 UT) TABS, Take by mouth., Disp: , Rfl:  .  cyclobenzaprine (FLEXERIL) 10 MG tablet, Take 1 tablet (10 mg total) by mouth 3 (three) times daily as needed for muscle spasms., Disp: 30 tablet, Rfl: 0 .  dicyclomine (BENTYL) 20 MG tablet, Take 1 tablet (20 mg total) by mouth 4 (four) times daily -  before meals and at bedtime., Disp: 120 tablet, Rfl: 3 .  Dulaglutide (TRULICITY) 1.5 XA/1.2IN SOPN, Inject 1.5 mg into the skin once a week., Disp: 4 pen, Rfl: 2 .  Evening Primrose Oil 1000 MG CAPS, Take by mouth., Disp: , Rfl:  .  hydrochlorothiazide (HYDRODIURIL) 25 MG tablet, Take 1 tablet (25 mg total) by mouth daily., Disp: 90 tablet, Rfl: 0 .  levothyroxine (SYNTHROID) 100 MCG tablet, Take 1 tablet (100 mcg total) by mouth daily before breakfast., Disp: 90 tablet, Rfl: 0 .  lisinopril (ZESTRIL) 20 MG tablet, Take 20 mg by mouth daily., Disp: , Rfl:  .  Melatonin 10 MG CAPS, Take by mouth., Disp: , Rfl:  .  metFORMIN (GLUCOPHAGE) 500 MG tablet, Take 2 tabs twice daily, Disp: 360 tablet, Rfl: 0 .  Microlet Lancets MISC, by Does not apply route. Test 3-4 times daily (Micorlet Colored Lancets), Disp: , Rfl:  .  Nutritional Supplements (ESTROVEN EXTRA STRENGTH PO), Take by mouth., Disp: , Rfl:  .  Omega-3 Fatty Acids (FISH OIL) 1000 MG CAPS, Take by mouth., Disp: , Rfl:  .  omeprazole (PRILOSEC) 40 MG capsule, Take 1 capsule (40 mg total) by mouth daily., Disp: 90 capsule, Rfl: 3 .  rifaximin (XIFAXAN) 550 MG TABS tablet, Take 1 tablet (550 mg total) by mouth 3 (three) times daily for 14 days., Disp: 42 tablet, Rfl: 0 .  rosuvastatin (CRESTOR) 10 MG tablet, Take 1 tablet (10 mg total) by mouth daily., Disp: 90 tablet, Rfl: 0 .  venlafaxine XR (EFFEXOR-XR) 37.5 MG 24 hr capsule, Take 37.5 mg by mouth daily., Disp: , Rfl:  .  fluconazole (DIFLUCAN) 150 MG tablet, Take 1 tablet (150 mg total) by mouth once for 1 dose., Disp: 1 tablet, Rfl: 0  EXAM:   VITALS per patient if applicable: None  reported  GENERAL: alert, oriented, appears well and in no acute distress  HEENT: atraumatic, conjunttiva clear, no obvious abnormalities on inspection of external nose and ears, wears corrective lenses  NECK: normal movements of the head and neck  LUNGS: on inspection no signs of respiratory distress, breathing rate appears normal, no obvious gross increased work of breathing, gasping or wheezing  CV: no obvious cyanosis  MS: moves all visible extremities without noticeable abnormality  PSYCH/NEURO: pleasant and cooperative, no obvious depression or anxiety, speech and thought processing grossly intact  ASSESSMENT AND PLAN:   Vaginal yeast infection  -Wonder if she may have a vaginal yeast infection given more prominent symptoms are itching and burning of the introitus during urination. -Will prescribe Diflucan 150 mg x 1, she can also use OTC antifungal cream to apply to her  external vulvar area if needed. -Not concerned for STDs at this time as she is not sexually active.     I discussed the assessment and treatment plan with the patient. The patient was provided an opportunity to ask questions and all were answered. The patient agreed with the plan and demonstrated an understanding of the instructions.   The patient was advised to call back or seek an in-person evaluation if the symptoms worsen or if the condition fails to improve as anticipated.    Lelon Frohlich, MD  [facility]

## 2019-04-16 ENCOUNTER — Ambulatory Visit: Payer: BC Managed Care – PPO | Admitting: Internal Medicine

## 2019-04-20 ENCOUNTER — Other Ambulatory Visit: Payer: Self-pay | Admitting: Internal Medicine

## 2019-05-07 ENCOUNTER — Ambulatory Visit: Payer: BC Managed Care – PPO | Admitting: Internal Medicine

## 2019-05-26 ENCOUNTER — Other Ambulatory Visit: Payer: Self-pay

## 2019-05-27 ENCOUNTER — Ambulatory Visit (INDEPENDENT_AMBULATORY_CARE_PROVIDER_SITE_OTHER): Payer: BC Managed Care – PPO | Admitting: Internal Medicine

## 2019-05-27 ENCOUNTER — Encounter: Payer: Self-pay | Admitting: Internal Medicine

## 2019-05-27 VITALS — BP 120/70 | HR 84 | Temp 97.8°F | Wt 191.7 lb

## 2019-05-27 DIAGNOSIS — R300 Dysuria: Secondary | ICD-10-CM

## 2019-05-27 DIAGNOSIS — B373 Candidiasis of vulva and vagina: Secondary | ICD-10-CM

## 2019-05-27 DIAGNOSIS — B3731 Acute candidiasis of vulva and vagina: Secondary | ICD-10-CM

## 2019-05-27 LAB — POCT URINALYSIS DIPSTICK
Bilirubin, UA: NEGATIVE
Blood, UA: NEGATIVE
Glucose, UA: NEGATIVE
Ketones, UA: NEGATIVE
Leukocytes, UA: NEGATIVE
Nitrite, UA: NEGATIVE
Protein, UA: NEGATIVE
Spec Grav, UA: 1.02 (ref 1.010–1.025)
Urobilinogen, UA: 0.2 E.U./dL
pH, UA: 5.5 (ref 5.0–8.0)

## 2019-05-27 MED ORDER — FLUCONAZOLE 150 MG PO TABS: 150.0000 mg | ORAL_TABLET | Freq: Every day | ORAL | 0 refills | Status: AC

## 2019-05-27 NOTE — Progress Notes (Signed)
Established Patient Office Visit     This visit occurred during the SARS-CoV-2 public health emergency.  Safety protocols were in place, including screening questions prior to the visit, additional usage of staff PPE, and extensive cleaning of exam room while observing appropriate contact time as indicated for disinfecting solutions.    CC/Reason for Visit: Vulvar itching, burning with urination  HPI: Krista Dawson is a 54 y.o. female who is coming in today for the above mentioned reasons. She is a diabetic. For 3 days has been having itching and a red rash over vulvar area. Has now started to have burning when urinates. She is unable to produce a urine sample today. No discharge, fevers. She is not sexually active.   Past Medical/Surgical History: Past Medical History:  Diagnosis Date  . Allergy   . Asthma    exercise induced - not used inhaler couple of yrs pt reported 10-03-2018  . Blood transfusion without reported diagnosis   . Colon polyp   . Depression   . DM (diabetes mellitus) ([place])   . GERD (gastroesophageal reflux disease)   . HTN (hypertension)   . Hyperlipidemia   . Hypothyroidism   . NASH (nonalcoholic steatohepatitis)   . Obesity (BMI 30.0-34.9)   . Post menopausal problems   . Sleep apnea    no c-pap  . Transaminitis   . Vitamin D deficiency     Past Surgical History:  Procedure Laterality Date  . ADRENALECTOMY Right   . APPENDECTOMY    . BREAST REDUCTION SURGERY    . CHOLECYSTECTOMY    . HYSTERECTOMY ABDOMINAL WITH SALPINGECTOMY    . LIVER BIOPSY  2008, 2013   NASH  . TONSILLECTOMY    . UPPER GASTROINTESTINAL ENDOSCOPY      Social History:  reports that she quit smoking about 12 years ago. Her smoking use included cigarettes. She has never used smokeless tobacco. She reports current alcohol use. She reports that she does not use drugs.  Allergies: Allergies  Allergen Reactions  . Spironolactone Hives and Swelling  . Paxil  [Paroxetine Hcl] Other (See Comments)    Other reaction(s): Other (comments) WORSENED DEPRESSION. WORSENED DEPRESSION.   Marland Kitchen Codeine Itching and Rash  . Sulfur Rash    Family History:  Family History  Problem Relation Age of Onset  . Breast cancer Mother   . Colon polyps Mother   . Stroke Mother 76  . GER disease Father   . Diabetes Father   . Kidney disease Father   . CAD Father   . Colon polyps Sister   . Stroke Sister   . Diabetes Maternal Grandfather   . Heart disease Maternal Grandfather   . Diabetes Paternal Grandmother   . CAD Paternal Grandfather   . Pancreatic cancer Paternal Uncle   . Pancreatic cancer Maternal Uncle   . Colon cancer Neg Hx   . Esophageal cancer Neg Hx   . Stomach cancer Neg Hx   . Rectal cancer Neg Hx      Current Outpatient Medications:  .  aspirin EC 81 MG tablet, Take 81 mg by mouth daily., Disp: , Rfl:  .  Blood Glucose Monitoring Suppl (CONTOUR NEXT ONE) KIT, by Does not apply route., Disp: , Rfl:  .  Cholecalciferol (VITAMIN D3) 50 MCG (2000 UT) TABS, Take by mouth., Disp: , Rfl:  .  cyclobenzaprine (FLEXERIL) 10 MG tablet, Take 1 tablet (10 mg total) by mouth 3 (three) times daily as needed for muscle  spasms., Disp: 30 tablet, Rfl: 0 .  dicyclomine (BENTYL) 20 MG tablet, Take 1 tablet (20 mg total) by mouth 4 (four) times daily -  before meals and at bedtime., Disp: 120 tablet, Rfl: 3 .  Dulaglutide (TRULICITY) 1.5 YQ/0.3KV SOPN, Inject 1.5 mg into the skin once a week., Disp: 4 pen, Rfl: 2 .  Evening Primrose Oil 1000 MG CAPS, Take by mouth., Disp: , Rfl:  .  hydrochlorothiazide (HYDRODIURIL) 25 MG tablet, TAKE 1 TABLET BY MOUTH DAILY GENERIC EQUIVALENT FOR HYDRODIURIL, Disp: 90 tablet, Rfl: 0 .  levothyroxine (SYNTHROID) 100 MCG tablet, TAKE 1 TABLET BY MOUTH DAILY BEFORE BREAKFAST, Disp: 90 tablet, Rfl: 0 .  lisinopril (ZESTRIL) 20 MG tablet, Take 20 mg by mouth daily., Disp: , Rfl:  .  Melatonin 10 MG CAPS, Take by mouth., Disp: , Rfl:   .  metFORMIN (GLUCOPHAGE) 500 MG tablet, TAKE 2 TABLETS BY MOUTH TWICE DAILY, Disp: 360 tablet, Rfl: 0 .  Microlet Lancets MISC, by Does not apply route. Test 3-4 times daily (Micorlet Colored Lancets), Disp: , Rfl:  .  Nutritional Supplements (ESTROVEN EXTRA STRENGTH PO), Take by mouth., Disp: , Rfl:  .  Omega-3 Fatty Acids (FISH OIL) 1000 MG CAPS, Take by mouth., Disp: , Rfl:  .  omeprazole (PRILOSEC) 40 MG capsule, Take 1 capsule (40 mg total) by mouth daily., Disp: 90 capsule, Rfl: 3 .  rifaximin (XIFAXAN) 550 MG TABS tablet, , Disp: , Rfl:  .  rosuvastatin (CRESTOR) 10 MG tablet, Take 1 tablet (10 mg total) by mouth daily., Disp: 90 tablet, Rfl: 0 .  venlafaxine XR (EFFEXOR-XR) 37.5 MG 24 hr capsule, Take 37.5 mg by mouth daily., Disp: , Rfl:  .  fluconazole (DIFLUCAN) 150 MG tablet, Take 1 tablet (150 mg total) by mouth daily for 3 days., Disp: 3 tablet, Rfl: 0  Review of Systems:  Constitutional: Denies fever, chills, diaphoresis, appetite change and fatigue.  HEENT: Denies photophobia, eye pain, redness, hearing loss, ear pain, congestion, sore throat, rhinorrhea, sneezing, mouth sores, trouble swallowing, neck pain, neck stiffness and tinnitus.   Respiratory: Denies SOB, DOE, cough, chest tightness,  and wheezing.   Cardiovascular: Denies chest pain, palpitations and leg swelling.  Gastrointestinal: Denies nausea, vomiting, abdominal pain, diarrhea, constipation, blood in stool and abdominal distention.  Genitourinary: Deniesurgency, frequency, hematuria, flank pain and difficulty urinating.  Endocrine: Denies: hot or cold intolerance, sweats, changes in hair or nails, polyuria, polydipsia. Musculoskeletal: Denies myalgias, back pain, joint swelling, arthralgias and gait problem.  Skin: Denies pallor and wound.  Neurological: Denies dizziness, seizures, syncope, weakness, light-headedness, numbness and headaches.  Hematological: Denies adenopathy. Easy bruising, personal or family  bleeding history  Psychiatric/Behavioral: Denies suicidal ideation, mood changes, confusion, nervousness, sleep disturbance and agitation    Physical Exam: Vitals:   05/27/19 0722  BP: 120/70  Pulse: 84  Temp: 97.8 F (36.6 C)  TempSrc: Temporal  SpO2: 93%  Weight: 191 lb 11.2 oz (87 kg)    Body mass index is 31.9 kg/m.   Constitutional: NAD, calm, comfortable Eyes: PERRL, lids and conjunctivae normal, wears corrective lenses. ENMT: Mucous membranes are moist.  Abdomen: no tenderness, no masses palpated. No hepatosplenomegaly. Bowel sounds positive.  Neurologic:grossly intact and non-focal Psychiatric: Normal judgment and insight. Alert and oriented x 3. Normal mood.    Impression and Plan:  Vaginal yeast infection -Diflucan daily x 3. -urine dipstick negative.    Patient Instructions  -Nice seeing you today!!  -Take diflucan 150 mg daily for  3 days.     Lelon Frohlich, MD [facility] at [facility]

## 2019-05-27 NOTE — Patient Instructions (Signed)
-  Nice seeing you today!!  -Take diflucan 150 mg daily for 3 days.

## 2019-06-05 ENCOUNTER — Other Ambulatory Visit: Payer: Self-pay | Admitting: Internal Medicine

## 2019-06-05 DIAGNOSIS — E119 Type 2 diabetes mellitus without complications: Secondary | ICD-10-CM

## 2019-06-30 ENCOUNTER — Ambulatory Visit: Payer: BC Managed Care – PPO | Admitting: Gastroenterology

## 2019-07-08 ENCOUNTER — Encounter: Payer: Self-pay | Admitting: Internal Medicine

## 2019-07-13 ENCOUNTER — Other Ambulatory Visit: Payer: Self-pay | Admitting: Internal Medicine

## 2019-07-15 ENCOUNTER — Ambulatory Visit: Payer: BC Managed Care – PPO | Admitting: Internal Medicine

## 2019-08-06 ENCOUNTER — Ambulatory Visit (INDEPENDENT_AMBULATORY_CARE_PROVIDER_SITE_OTHER): Payer: BC Managed Care – PPO | Admitting: Internal Medicine

## 2019-08-06 ENCOUNTER — Encounter: Payer: Self-pay | Admitting: Internal Medicine

## 2019-08-06 ENCOUNTER — Other Ambulatory Visit: Payer: Self-pay

## 2019-08-06 VITALS — BP 112/78 | HR 75 | Temp 98.3°F | Wt 189.9 lb

## 2019-08-06 DIAGNOSIS — E785 Hyperlipidemia, unspecified: Secondary | ICD-10-CM

## 2019-08-06 DIAGNOSIS — K7581 Nonalcoholic steatohepatitis (NASH): Secondary | ICD-10-CM

## 2019-08-06 DIAGNOSIS — I10 Essential (primary) hypertension: Secondary | ICD-10-CM

## 2019-08-06 DIAGNOSIS — E119 Type 2 diabetes mellitus without complications: Secondary | ICD-10-CM | POA: Diagnosis not present

## 2019-08-06 DIAGNOSIS — E039 Hypothyroidism, unspecified: Secondary | ICD-10-CM

## 2019-08-06 DIAGNOSIS — E669 Obesity, unspecified: Secondary | ICD-10-CM

## 2019-08-06 DIAGNOSIS — R7401 Elevation of levels of liver transaminase levels: Secondary | ICD-10-CM

## 2019-08-06 DIAGNOSIS — N898 Other specified noninflammatory disorders of vagina: Secondary | ICD-10-CM

## 2019-08-06 DIAGNOSIS — E559 Vitamin D deficiency, unspecified: Secondary | ICD-10-CM

## 2019-08-06 LAB — POCT GLYCOSYLATED HEMOGLOBIN (HGB A1C): Hemoglobin A1C: 5.8 % — AB (ref 4.0–5.6)

## 2019-08-06 NOTE — Patient Instructions (Signed)
-  Nice seeing you today!!  -Referral to GYN today.  -Schedule your physical in 3-4 months. Please come in fasting that day.

## 2019-08-06 NOTE — Progress Notes (Signed)
Established Patient Office Visit     This visit occurred during the SARS-CoV-2 public health emergency.  Safety protocols were in place, including screening questions prior to the visit, additional usage of staff PPE, and extensive cleaning of exam room while observing appropriate contact time as indicated for disinfecting solutions.    CC/Reason for Visit: Follow-up chronic medical conditions  HPI: Krista Dawson is a 54 y.o. female who is coming in today for the above mentioned reasons. Past Medical History is significant for: Well-controlled hypertension, hyperlipidemia, hypothyroidism, type 2 diabetes on Metformin and Trulicity as well//transaminitis followed by GI.  She continues to complain of significant vulvar itching.  She has had a few courses of antibiotics and antifungals without relief.  She continues to lose weight, and additional 3 pounds since her last visit in May.  She has adopted a healthier lifestyle.   Past Medical/Surgical History: Past Medical History:  Diagnosis Date  . Allergy   . Asthma    exercise induced - not used inhaler couple of yrs pt reported 10-03-2018  . Blood transfusion without reported diagnosis   . Colon polyp   . Depression   . DM (diabetes mellitus) ([place])   . GERD (gastroesophageal reflux disease)   . HTN (hypertension)   . Hyperlipidemia   . Hypothyroidism   . NASH (nonalcoholic steatohepatitis)   . Obesity (BMI 30.0-34.9)   . Post menopausal problems   . Sleep apnea    no c-pap  . Transaminitis   . Vitamin D deficiency     Past Surgical History:  Procedure Laterality Date  . ADRENALECTOMY Right   . APPENDECTOMY    . BREAST REDUCTION SURGERY    . CHOLECYSTECTOMY    . HYSTERECTOMY ABDOMINAL WITH SALPINGECTOMY    . LIVER BIOPSY  2008, 2013   NASH  . TONSILLECTOMY    . UPPER GASTROINTESTINAL ENDOSCOPY      Social History:  reports that she quit smoking about 12 years ago. Her smoking use included cigarettes. She has  never used smokeless tobacco. She reports current alcohol use. She reports that she does not use drugs.  Allergies: Allergies  Allergen Reactions  . Spironolactone Hives and Swelling  . Paxil [Paroxetine Hcl] Other (See Comments)    Other reaction(s): Other (comments) WORSENED DEPRESSION. WORSENED DEPRESSION.   Marland Kitchen Codeine Itching and Rash  . Sulfur Rash    Family History:  Family History  Problem Relation Age of Onset  . Breast cancer Mother   . Colon polyps Mother   . Stroke Mother 43  . GER disease Father   . Diabetes Father   . Kidney disease Father   . CAD Father   . Colon polyps Sister   . Stroke Sister   . Diabetes Maternal Grandfather   . Heart disease Maternal Grandfather   . Diabetes Paternal Grandmother   . CAD Paternal Grandfather   . Pancreatic cancer Paternal Uncle   . Pancreatic cancer Maternal Uncle   . Colon cancer Neg Hx   . Esophageal cancer Neg Hx   . Stomach cancer Neg Hx   . Rectal cancer Neg Hx      Current Outpatient Medications:  .  aspirin EC 81 MG tablet, Take 81 mg by mouth daily., Disp: , Rfl:  .  Blood Glucose Monitoring Suppl (CONTOUR NEXT ONE) KIT, by Does not apply route., Disp: , Rfl:  .  Cholecalciferol (VITAMIN D3) 50 MCG (2000 UT) TABS, Take by mouth., Disp: , Rfl:  .  cyclobenzaprine (FLEXERIL) 10 MG tablet, Take 1 tablet (10 mg total) by mouth 3 (three) times daily as needed for muscle spasms., Disp: 30 tablet, Rfl: 0 .  dicyclomine (BENTYL) 20 MG tablet, Take 1 tablet (20 mg total) by mouth 4 (four) times daily -  before meals and at bedtime., Disp: 120 tablet, Rfl: 3 .  Evening Primrose Oil 1000 MG CAPS, Take by mouth., Disp: , Rfl:  .  hydrochlorothiazide (HYDRODIURIL) 25 MG tablet, TAKE 1 TABLET BY MOUTH DAILY GENERIC EQUIVALENT FOR HYDRODIURIL, Disp: 90 tablet, Rfl: 1 .  levothyroxine (SYNTHROID) 100 MCG tablet, TAKE 1 TABLET BY MOUTH DAILY BEFORE BREAKFAST, Disp: 90 tablet, Rfl: 1 .  lisinopril (ZESTRIL) 20 MG tablet, Take 20  mg by mouth daily., Disp: , Rfl:  .  Melatonin 10 MG CAPS, Take by mouth., Disp: , Rfl:  .  metFORMIN (GLUCOPHAGE) 500 MG tablet, TAKE 2 TABLETS BY MOUTH TWICE DAILY, Disp: 360 tablet, Rfl: 1 .  Microlet Lancets MISC, by Does not apply route. Test 3-4 times daily (Micorlet Colored Lancets), Disp: , Rfl:  .  Nutritional Supplements (ESTROVEN EXTRA STRENGTH PO), Take by mouth., Disp: , Rfl:  .  Omega-3 Fatty Acids (FISH OIL) 1000 MG CAPS, Take by mouth., Disp: , Rfl:  .  omeprazole (PRILOSEC) 40 MG capsule, Take 1 capsule (40 mg total) by mouth daily., Disp: 90 capsule, Rfl: 3 .  rifaximin (XIFAXAN) 550 MG TABS tablet, , Disp: , Rfl:  .  rosuvastatin (CRESTOR) 10 MG tablet, Take 1 tablet (10 mg total) by mouth daily., Disp: 90 tablet, Rfl: 0 .  TRULICITY 1.5 VQ/2.5ZD SOPN, INJECT 1.5 MG UNDER THE SKIN ONCE A WEEK, Disp: 6 mL, Rfl: 2 .  venlafaxine XR (EFFEXOR-XR) 37.5 MG 24 hr capsule, Take 37.5 mg by mouth daily., Disp: , Rfl:   Review of Systems:  Constitutional: Denies fever, chills, diaphoresis, appetite change and fatigue.  HEENT: Denies photophobia, eye pain, redness, hearing loss, ear pain, congestion, sore throat, rhinorrhea, sneezing, mouth sores, trouble swallowing, neck pain, neck stiffness and tinnitus.   Respiratory: Denies SOB, DOE, cough, chest tightness,  and wheezing.   Cardiovascular: Denies chest pain, palpitations and leg swelling.  Gastrointestinal: Denies nausea, vomiting, abdominal pain, diarrhea, constipation, blood in stool and abdominal distention.  Genitourinary: Denies dysuria, urgency, frequency, hematuria, flank pain and difficulty urinating.  Endocrine: Denies: hot or cold intolerance, sweats, changes in hair or nails, polyuria, polydipsia. Musculoskeletal: Denies myalgias, back pain, joint swelling, arthralgias and gait problem.  Skin: Denies pallor, rash and wound.  Neurological: Denies dizziness, seizures, syncope, weakness, light-headedness, numbness and  headaches.  Hematological: Denies adenopathy. Easy bruising, personal or family bleeding history  Psychiatric/Behavioral: Denies suicidal ideation, mood changes, confusion, nervousness, sleep disturbance and agitation    Physical Exam: Vitals:   08/06/19 0805  BP: 112/78  Pulse: 75  Temp: 98.3 F (36.8 C)  TempSrc: Oral  SpO2: 96%  Weight: 189 lb 14.4 oz (86.1 kg)    Body mass index is 31.6 kg/m.   Constitutional: NAD, calm, comfortable Eyes: PERRL, lids and conjunctivae normal ENMT: Mucous membranes are moist. Respiratory: clear to auscultation bilaterally, no wheezing, no crackles. Normal respiratory effort. No accessory muscle use.  Cardiovascular: Regular rate and rhythm, no murmurs / rubs / gallops. No extremity edema.  Neurologic: Grossly intact and nonfocal Psychiatric: Normal judgment and insight. Alert and oriented x 3. Normal mood.    Impression and Plan:  Type 2 diabetes mellitus without complication, without long-term current use of  insulin ([place])  -Very well controlled with an A1c of 5.8 today, continue Metformin and Trulicity.  Vaginal itching  - Plan: Ambulatory referral to Gynecology -At this point I do not believe that further prescription of antibiotics (all cultures have been negative in the past) or treatment for yeast infections will be helpful. -I wonder if she might have lichen sclerosus.  Referral to GYN today.  Essential hypertension -Well-controlled.  Hyperlipidemia, unspecified hyperlipidemia type -Well-controlled with LDL of 66 in November 2020.  Hypothyroidism, unspecified type -Last TSH was 0.690 in November 2020.  NASH (nonalcoholic steatohepatitis) Transaminitis -Followed by GI.  Vitamin D deficiency -Recheck vitamin D when she returns for her physical.  Obesity (BMI 30.0-34.9) -Discussed healthy lifestyle, including increased physical activity and better food choices to promote weight loss. -She has been congratulated on her  success thus far.    Patient Instructions  -Nice seeing you today!!  -Referral to GYN today.  -Schedule your physical in 3-4 months. Please come in fasting that day.     Lelon Frohlich, MD [facility] at [facility]

## 2019-08-11 ENCOUNTER — Ambulatory Visit (INDEPENDENT_AMBULATORY_CARE_PROVIDER_SITE_OTHER): Payer: BC Managed Care – PPO | Admitting: Obstetrics and Gynecology

## 2019-08-11 ENCOUNTER — Encounter: Payer: Self-pay | Admitting: Obstetrics and Gynecology

## 2019-08-11 ENCOUNTER — Other Ambulatory Visit: Payer: Self-pay

## 2019-08-11 VITALS — BP 122/80 | Ht 65.0 in | Wt 184.0 lb

## 2019-08-11 DIAGNOSIS — L900 Lichen sclerosus et atrophicus: Secondary | ICD-10-CM

## 2019-08-11 MED ORDER — CLOBETASOL PROPIONATE 0.05 % EX CREA: 1.0000 "application " | TOPICAL_CREAM | Freq: Every day | CUTANEOUS | 3 refills | Status: DC

## 2019-08-11 NOTE — Progress Notes (Signed)
Krista Dawson [birth year] [id number]  SUBJECTIVE:  54 y.o. [place] female new patient who presents for vaginal itching.  It is external itching especially worse on the right and anterior portion of the vulvar area.  No discharge, no vaginal bleeding.  History of diabetes with improved control and A1c numbers.  She has had mild vulvar itching in the past and she has treated with nystatin, but the itching she has had more recently is more intense.  No other skin conditions.  Not sexually active.  Not using hormones.  Denies any previous history of abnormal Pap smears.  Previous hysterectomy and BSO for benign indications (heavy periods and dysmenorrhea).  Current Outpatient Medications  Medication Sig Dispense Refill  . aspirin EC 81 MG tablet Take 81 mg by mouth daily.    . Blood Glucose Monitoring Suppl (CONTOUR NEXT ONE) KIT by Does not apply route.    . Cholecalciferol (VITAMIN D3) 50 MCG (2000 UT) TABS Take by mouth.    . cyclobenzaprine (FLEXERIL) 10 MG tablet Take 1 tablet (10 mg total) by mouth 3 (three) times daily as needed for muscle spasms. 30 tablet 0  . dicyclomine (BENTYL) 20 MG tablet Take 1 tablet (20 mg total) by mouth 4 (four) times daily -  before meals and at bedtime. 120 tablet 3  . Evening Primrose Oil 1000 MG CAPS Take by mouth.    . hydrochlorothiazide (HYDRODIURIL) 25 MG tablet TAKE 1 TABLET BY MOUTH DAILY GENERIC EQUIVALENT FOR HYDRODIURIL 90 tablet 1  . levothyroxine (SYNTHROID) 100 MCG tablet TAKE 1 TABLET BY MOUTH DAILY BEFORE BREAKFAST 90 tablet 1  . lisinopril (ZESTRIL) 20 MG tablet Take 20 mg by mouth daily.    . Melatonin 10 MG CAPS Take by mouth.    . metFORMIN (GLUCOPHAGE) 500 MG tablet TAKE 2 TABLETS BY MOUTH TWICE DAILY 360 tablet 1  . Microlet Lancets MISC by Does not apply route. Test 3-4 times daily (Micorlet Colored Lancets)    . Omega-3 Fatty Acids (FISH OIL) 1000 MG CAPS Take by mouth.    Marland Kitchen omeprazole (PRILOSEC) 40 MG capsule Take 1 capsule (40 mg  total) by mouth daily. 90 capsule 3  . rosuvastatin (CRESTOR) 10 MG tablet Take 1 tablet (10 mg total) by mouth daily. 90 tablet 0  . TRULICITY 1.5 LA/4.5XM SOPN INJECT 1.5 MG UNDER THE SKIN ONCE A WEEK 6 mL 2  . venlafaxine XR (EFFEXOR-XR) 37.5 MG 24 hr capsule Take 37.5 mg by mouth daily.     No current facility-administered medications for this visit.   Allergies: Spironolactone, Paxil [paroxetine hcl], Codeine, and Sulfur  No LMP recorded. Patient has had a hysterectomy.  Past medical history,surgical history, problem list, medications, allergies, family history and social history were all reviewed and documented as reviewed in the EPIC chart.  ROS:  Feeling well. No dyspnea or chest pain on exertion.  No abdominal pain, change in bowel habits, black or bloody stools.  No urinary tract symptoms. GYN ROS: normal menses, no abnormal bleeding, pelvic pain or discharge, no breast pain or new or enlarging lumps on self exam. No neurological complaints.    OBJECTIVE:  BP 122/80 (BP Location: Right Arm, Patient Position: Sitting, Cuff Size: Normal)   Ht _0  (1.651 m)   Wt 184 lb (83.5 kg)   BMI 30.62 kg/m  The patient appears well, alert, oriented x 3, in no distress. PELVIC EXAM: VULVA: Faint blanching diffusely over the right labia minora and majora with excoriations posteriorly, more  intense blanching over the anterior commissure, otherwise normal appearing vulva with no masses, tenderness or lesions, atrophic changes, VAGINA: normal appearing atrophic vagina with normal color and discharge, no lesions.  Cervix is surgically absent.  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  We discussed there are no findings on  the physical exam to suggest VIN or carcinoma.  No findings to suggest an infectious etiology.  I recommended empiric treatment with clobetasol cream 0.05% applied nightly for 2 weeks, tapering use from there to 2 nights per week.  She also is showing signs of postmenopausal vulvovaginal atrophy and topical estrogen cream can help with that if she is continuing to have symptoms (such as dryness and generalized irritation) despite the topical steroid use.  She will follow up in 2 to 3 months if she is continuing to have symptoms after allowing time for the topical steroid to work.   Joseph Pierini MD 08/11/19

## 2019-08-11 NOTE — Patient Instructions (Signed)
Lichen Sclerosus Lichen sclerosus is a skin problem. It can happen on any part of the body, but it commonly involves the anal or genital areas. It can cause itching and discomfort in these areas. Treatment can help to control symptoms. When the genital area is affected, getting treatment is important because the condition can cause scarring that may lead to other problems. What are the causes? The cause of this condition is not known. It may be related to an overactive immune system or a lack of certain hormones. Lichen sclerosus is not an infection or a fungus, and it is not passed from one person to another (not contagious). What increases the risk? This condition is more likely to develop in women, usually after menopause. What are the signs or symptoms? Symptoms of this condition include:  Thin, wrinkled, white areas on the skin.  Thickened white areas on the skin.  Red and swollen patches (lesions) on the skin.  Tears or cracks in the skin.  Bruising.  Blood blisters.  Severe itching.  Pain, itching, or burning when urinating. Constipation is also common in people with lichen sclerosus. How is this diagnosed? This condition may be diagnosed with a physical exam. In some cases, a tissue sample (biopsy sample) may be removed to be looked at under a microscope. How is this treated? This condition is usually treated with medicated creams or ointments (topical steroids) that are applied over the affected areas. In some cases, treatment may also include medicines that are taken by mouth. Surgery may be needed in more severe cases that are causing problems such as scarring. Follow these instructions at home:  Take or use over-the-counter and prescription medicines only as told by your health care provider.  Use creams or ointments as told by your health care provider.  Do not scratch the affected areas of skin.  If you are a woman, be sure to keep the vaginal area as clean and dry  as possible.  Clean the affected area of skin gently with water. Avoid using rough towels or toilet paper.  Keep all follow-up visits as told by your health care provider. This is important. Contact a health care provider if:  You have increasing redness, swelling, or pain in the affected area.  You have fluid, blood, or pus coming from the affected area.  You have new lesions on your skin.  You have a fever.  You have pain during sex. Summary  Lichen sclerosus is a skin problem. When the genital area is affected, getting treatment is important because the condition can cause scarring that may lead to other problems.  This condition is usually treated with medicated creams or ointments (topical steroids) that are applied over the affected areas.  Take or use over-the-counter and prescription medicines only as told by your health care provider.  Contact a health care provider if you have new lesions on your skin, have pain during sex, or have increasing redness, swelling, or pain in the affected area.  Keep all follow-up visits as told by your health care provider. This is important. This information is not intended to replace advice given to you by your health care provider. Make sure you discuss any questions you have with your health care provider. Document Revised: 05/09/2017 Document Reviewed: 05/09/2017 Elsevier Patient Education  [employer].

## 2019-09-01 ENCOUNTER — Ambulatory Visit (INDEPENDENT_AMBULATORY_CARE_PROVIDER_SITE_OTHER): Payer: BC Managed Care – PPO | Admitting: Gastroenterology

## 2019-09-01 ENCOUNTER — Encounter: Payer: Self-pay | Admitting: Gastroenterology

## 2019-09-01 VITALS — BP 110/70 | HR 75 | Ht 65.0 in | Wt 188.0 lb

## 2019-09-01 DIAGNOSIS — R748 Abnormal levels of other serum enzymes: Secondary | ICD-10-CM | POA: Diagnosis not present

## 2019-09-01 DIAGNOSIS — K7581 Nonalcoholic steatohepatitis (NASH): Secondary | ICD-10-CM | POA: Diagnosis not present

## 2019-09-01 DIAGNOSIS — R140 Abdominal distension (gaseous): Secondary | ICD-10-CM

## 2019-09-01 NOTE — Progress Notes (Signed)
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/2019: F0 fibrosis, S3 steatosis, N2 NASH Hyperplastic polyp on colonoscopy 10/03/18 Family history of colon polyps    - Sister adenoma >1.0 cm, CHEK-2 mutation    - Mother with colon polyps and breast cancer Family history of pancreatic cancer (Maternal uncle and paternal uncle <60) BMI 34  Suspected SIBO: Symptoms resolved with Xifaxan 550 mg TID for 14 days. Continue to use dicyclomine PRN.   Reflux and gastritis on recent EGD:  Continue omeprazole 40 mg BID. Hopefully, her GI symptoms will improve with ongoing weight loss.   Fatty liver: Encouraged by her weight loss. No new recommendations at this time. Hopefully we will see an improvement in her liver enzymes with continued weight loss.   Family history of polyps: No precancerous polyps on colonoscopy 09/2018. Surveillance colonoscopy recommended in 2025 given the family history.    PLAN: Continue omeprazole 40 mg BID Continue Dicyclomine 20 mg QID PRN Work to maintain a healthy weight and maximize control of diabetes and hyperlipidemia Colonoscopy 2025 Follow-up in the office as needed, at least annually   HPI: Krista Dawson is a 54 y.o. female whom  I met 10/03/2018 for open access screening colonoscopy.  A small hyperplastic polyp was removed at that time.  She was then referred by Dr. Isaac Bliss for fatty liver and elevated liver enzymes. At the time of that consultation, she reported symptoms of dyspepsia that were evaluated with EGD and imaging studies. She was last seen 04/14/19.  The interval history is obtained through the patient and review of her electronic health record.  Moved to [place] from [place] in March/2020 for her work. 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   She has metabolic syndrome X with obesity, diabetes, hypertension, dyslipidemia.  Started Tulicity last year. She has hypothyroidism that has been well controlled, vitamin D deficiency on supplementation, reflux previously on Prilosec and Nexium, hyperaldosteronism status post right adrenal gland removal in 2011, asthma, sleep apnea, postmenopausal symptoms after hysterectomy. She has a history of iron deficiency anemia that has resolved.  She was previously followed closely by Dr. Concepcion Living, a hepatologist at [facility], with the last visit 07/18/2018.  She has biopsy-proven nonalcoholic steatohepatitis with abnormal liver enzymes. 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  A follow-up biopsy in 2011 showed resolution of ballooning and steatosis.  A FibroScan 06/21/2016 showed a median E6.7 and CAP 325, indicating minimal fibrosis and moderate steatosis.  At the time of her last visit there 07/18/2018 they were discussing her recently elevated liver enzymes. Her weight was 95.3, height 165.5 for a BMI of 34.79.  He attributed these to weight gain and poor diabetic control.  He noted that she was  now living in [place] and suggested that the patient follow-up at [facility] or another [facility] participating site.  At the time of her initial consultation with me 02/16/19, her liver enzymes had continued to increase. No other new medications other than  Trulicity. No herbal, complimentary or Chinese herbal.   Labs 10/17/2017: AST 31, ALT 60, alk phos 114, total bilirubin 0.2, albumin 4.3, creatinine 0.64, hemoglobin A1c 6.2 Labs 06/10/2018: AST 116, ALT 160, alk phos 94, total bilirubin 0.4, hemoglobin 14, platelets 344, hemoglobin A1c 7.2, TSH 1.79 Labs 07/19/2018: AST 99, ALT 150, alk phos 117, total bilirubin less than 0.2, albumin 4.6, globulin 1.9, glucose 162, creatinine 0.80 Labs 11/28/2018: AST 167, ALT 220, alk phos 99, total bilirubin 0.4, total protein 6.8, glucose 224, creatinine 0.70, hemoglobin 12.9, platelets 269 Labs 02/18/2019: AST 78, ALT 122, alk phos 89, total bilirubin 0.4, total protein 6.8, glucose 173, iron 61, ferritin 159.7 NASH fibrosure 02/18/2019 shows F0 fibrosis with a fibrosis score of 0.14, S3 steatosis with a steatosis score of 0.83, and  N2 NASH.   She had several months of abdominal discomfort with associated bloating, eructation, epigastric fullness and early satiety. Ginger helps. On omeprazole OTC.  No NSAIDs. Uses 81 mg QD for prevention as recommended by a prior physician.  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  Esophageal biopsies showed reflux.  Gastric biopsy showed mild reactive gastropathy and mild chronic gastritis.  There was no H. pylori.  Duodenal biopsies were normal.  Follow-up gastric emptying scan to evaluate for gastroparesis given the retained food was normal.  Also had an abdominal ultrasound 02/23/19 to follow-up on the increase in her liver enzymes showed increased liver echogenicty likely reflecting steatosis. Cholecystectomy.   At the time of her office visit 04/14/19 she reported ongoing post-prandial bloating, abdominal pressure and nausea. Symptoms occur within 20 minutues of eating and last for hours. Associated anorexia, although she attributed to the Trulicity. Ginger provides temporarily relief. Also drinking  organic tumeric/ginger tea.  Bowel movements are normal.  No constipation or diarrhea.  She was treated with dicyclomine 20 mg QID and Xifaxan 554m TID x 14 days.  She returns today feeling better after completing 14 days of Xifaxan. Her nausea, vomiting, and post-prandial bloating are resolved. Her bowel habits are back to normal.   She is participating in a medically supervised weight loss program. Her energy has improved and she is now playing tennis 3 nights every week.   Past Medical History:  Diagnosis Date  . Allergy   . Asthma    exercise induced - not used inhaler couple of yrs pt reported 10-03-2018  . Blood transfusion without reported diagnosis   . Colon polyp   . Depression   . DM (diabetes mellitus) ([place]   . GERD (gastroesophageal reflux disease)   . HTN (hypertension)   . Hyperlipidemia   . Hypothyroidism   . NASH (nonalcoholic steatohepatitis)   . Obesity (BMI 30.0-34.9)   . Post menopausal problems   . Sleep apnea    no c-pap  . Transaminitis   . Vitamin D deficiency     Past Surgical History:  Procedure Laterality Date  . ADRENALECTOMY Right   . APPENDECTOMY    . BREAST REDUCTION SURGERY    . CHOLECYSTECTOMY    . HYSTERECTOMY ABDOMINAL WITH SALPINGECTOMY    . LIVER BIOPSY  2008, 2013   NASH  . TONSILLECTOMY    .  UPPER GASTROINTESTINAL ENDOSCOPY      Current Outpatient Medications  Medication Sig Dispense Refill  . aspirin EC 81 MG tablet Take 81 mg by mouth daily.    . Blood Glucose Monitoring Suppl (CONTOUR NEXT ONE) KIT by Does not apply route.    . Cholecalciferol (VITAMIN D3) 50 MCG (2000 UT) TABS Take by mouth.    . clobetasol cream (TEMOVATE) 1.61 % Apply 1 application topically at bedtime. Apply thin film to external vulvar area nightly x 2 weeks, every other night x 2 weeks, then 2 nights per week 30 g 3  . cyclobenzaprine (FLEXERIL) 10 MG tablet Take 1 tablet (10 mg total) by mouth 3 (three) times daily as needed for muscle spasms. 30 tablet  0  . dicyclomine (BENTYL) 20 MG tablet Take 1 tablet (20 mg total) by mouth 4 (four) times daily -  before meals and at bedtime. 120 tablet 3  . Evening Primrose Oil 1000 MG CAPS Take by mouth.    . hydrochlorothiazide (HYDRODIURIL) 25 MG tablet TAKE 1 TABLET BY MOUTH DAILY GENERIC EQUIVALENT FOR HYDRODIURIL 90 tablet 1  . levothyroxine (SYNTHROID) 100 MCG tablet TAKE 1 TABLET BY MOUTH DAILY BEFORE BREAKFAST 90 tablet 1  . lisinopril (ZESTRIL) 20 MG tablet Take 20 mg by mouth daily.    . Melatonin 10 MG CAPS Take by mouth.    . metFORMIN (GLUCOPHAGE) 500 MG tablet TAKE 2 TABLETS BY MOUTH TWICE DAILY 360 tablet 1  . Microlet Lancets MISC by Does not apply route. Test 3-4 times daily (Micorlet Colored Lancets)    . Omega-3 Fatty Acids (FISH OIL) 1000 MG CAPS Take by mouth.    Marland Kitchen omeprazole (PRILOSEC) 40 MG capsule Take 1 capsule (40 mg total) by mouth daily. 90 capsule 3  . rosuvastatin (CRESTOR) 10 MG tablet Take 1 tablet (10 mg total) by mouth daily. 90 tablet 0  . TRULICITY 1.5 WR/6.0AV SOPN INJECT 1.5 MG UNDER THE SKIN ONCE A WEEK 6 mL 2  . venlafaxine XR (EFFEXOR-XR) 37.5 MG 24 hr capsule Take 37.5 mg by mouth daily.     No current facility-administered medications for this visit.    Allergies as of 09/01/2019 - Review Complete 08/11/2019  Allergen Reaction Noted  . Spironolactone Hives and Swelling 06/23/2009  . Paxil [paroxetine hcl] Other (See Comments) 11/09/2013  . Codeine Itching and Rash 07/25/2007  . Sulfur Rash 08/29/2018    Family History  Problem Relation Age of Onset  . Breast cancer Mother   . Colon polyps Mother   . Stroke Mother 64  . GER disease Father   . Diabetes Father   . Kidney disease Father   . CAD Father   . Colon polyps Sister   . Stroke Sister   . Diabetes Maternal Grandfather   . Heart disease Maternal Grandfather   . Diabetes Paternal Grandmother   . CAD Paternal Grandfather   . Pancreatic cancer Paternal Uncle   . Pancreatic cancer Maternal  Uncle   . Colon cancer Neg Hx   . Esophageal cancer Neg Hx   . Stomach cancer Neg Hx   . Rectal cancer Neg Hx     Social History   Socioeconomic History  . Marital status: Single    Spouse name: Not on file  . Number of children: 0  . Years of education: Not on file  . Highest education level: Not on file  Occupational History  . Occupation: Scientist, water quality  Tobacco Use  . Smoking status:  Former Smoker    Types: Cigarettes    Quit date: 2009    Years since quitting: 12.6  . Smokeless tobacco: Never Used  . Tobacco comment: quit early 2000 per pt  Vaping Use  . Vaping Use: Never used  Substance and Sexual Activity  . Alcohol use: Yes    Comment: occasional  . Drug use: Never  . Sexual activity: Not Currently    Birth control/protection: Surgical    Comment: post menopausal  Other Topics Concern  . Not on file  Social History Narrative  . Not on file   Social Determinants of Health   Financial Resource Strain:   . Difficulty of Paying Living Expenses: Not on file  Food Insecurity:   . Worried About Charity fundraiser in the Last Year: Not on file  . Ran Out of Food in the Last Year: Not on file  Transportation Needs:   . Lack of Transportation (Medical): Not on file  . Lack of Transportation (Non-Medical): Not on file  Physical Activity:   . Days of Exercise per Week: Not on file  . Minutes of Exercise per Session: Not on file  Stress:   . Feeling of Stress : Not on file  Social Connections:   . Frequency of Communication with Friends and Family: Not on file  . Frequency of Social Gatherings with Friends and Family: Not on file  . Attends Religious Services: Not on file  . Active Member of Clubs or Organizations: Not on file  . Attends Archivist Meetings: Not on file  . Marital Status: Not on file  Intimate Partner Violence:   . Fear of Current or Ex-Partner: Not on file  . Emotionally Abused: Not on file  . Physically Abused: Not on file   . Sexually Abused: Not on file     Physical Exam: General:   Alert, in NAD. Appears her stated age.  HEENT: No scleral icterus. No bilateral temporal wasting. No thyromegaly.  Heart:  Regular rate and rhythm; no murmurs Pulm: Clear anteriorly; no wheezing Abdomen:  Soft. Central obesity. Nontender. Nondistended. Normal bowel sounds. No rebound or guarding. No hepatosplenomegaly.  No fluid wave.  LAD: No inguinal or umbilical LAD Extremities:  Without edema. No boney abnormalities.  Neurologic:  Alert and  oriented x4;  grossly normal neurologically; no asterixis or clonus. Skin: No jaundice. No palmar erythema or spider angioma. No Terry's nails.  Psych:  Alert and cooperative. Normal mood and affect.    Keah Lamba L. Tarri Glenn, MD, MPH 09/01/2019, 1:33 PM

## 2019-09-01 NOTE — Patient Instructions (Signed)
If you are age 52 or older, your body mass index should be between 23-30. Your Body mass index is 31.28 kg/m. If this is out of the aforementioned range listed, please consider follow up with your Primary Care Provider.  If you are age 59 or younger, your body mass index should be between 19-25. Your Body mass index is 31.28 kg/m. If this is out of the aformentioned range listed, please consider follow up with your Primary Care Provider.   Follow up as needed.  It was a pleasure to see you today!  Dr. Tarri Glenn

## 2019-09-08 ENCOUNTER — Encounter: Payer: Self-pay | Admitting: Internal Medicine

## 2019-09-08 LAB — HM MAMMOGRAPHY: HM Mammogram: NORMAL (ref 0–4)

## 2019-09-15 ENCOUNTER — Other Ambulatory Visit: Payer: Self-pay | Admitting: Internal Medicine

## 2019-09-15 ENCOUNTER — Encounter: Payer: Self-pay | Admitting: Internal Medicine

## 2019-09-16 MED ORDER — LISINOPRIL 20 MG PO TABS: 20.0000 mg | ORAL_TABLET | Freq: Every day | ORAL | 1 refills | Status: DC

## 2019-09-17 MED ORDER — ROSUVASTATIN CALCIUM 10 MG PO TABS: 10.0000 mg | ORAL_TABLET | Freq: Every day | ORAL | 0 refills | Status: DC

## 2019-10-02 ENCOUNTER — Encounter: Payer: Self-pay | Admitting: Internal Medicine

## 2019-10-04 ENCOUNTER — Other Ambulatory Visit: Payer: Self-pay | Admitting: Internal Medicine

## 2019-10-05 MED ORDER — LEVOTHYROXINE SODIUM 100 MCG PO TABS: 100.0000 ug | ORAL_TABLET | Freq: Every day | ORAL | 0 refills | Status: DC

## 2019-10-05 MED ORDER — ROSUVASTATIN CALCIUM 10 MG PO TABS: 10.0000 mg | ORAL_TABLET | Freq: Every day | ORAL | 0 refills | Status: DC

## 2019-10-13 ENCOUNTER — Encounter: Payer: Self-pay | Admitting: Internal Medicine

## 2019-10-21 ENCOUNTER — Ambulatory Visit (INDEPENDENT_AMBULATORY_CARE_PROVIDER_SITE_OTHER): Payer: BC Managed Care – PPO | Admitting: Internal Medicine

## 2019-10-21 ENCOUNTER — Other Ambulatory Visit: Payer: Self-pay

## 2019-10-21 ENCOUNTER — Encounter: Payer: Self-pay | Admitting: Internal Medicine

## 2019-10-21 VITALS — BP 126/84 | HR 99 | Temp 98.7°F | Resp 16 | Ht 65.5 in | Wt 191.5 lb

## 2019-10-21 DIAGNOSIS — E039 Hypothyroidism, unspecified: Secondary | ICD-10-CM | POA: Diagnosis not present

## 2019-10-21 DIAGNOSIS — E119 Type 2 diabetes mellitus without complications: Secondary | ICD-10-CM

## 2019-10-21 DIAGNOSIS — R7401 Elevation of levels of liver transaminase levels: Secondary | ICD-10-CM

## 2019-10-21 DIAGNOSIS — E785 Hyperlipidemia, unspecified: Secondary | ICD-10-CM

## 2019-10-21 DIAGNOSIS — K7581 Nonalcoholic steatohepatitis (NASH): Secondary | ICD-10-CM

## 2019-10-21 DIAGNOSIS — Z23 Encounter for immunization: Secondary | ICD-10-CM | POA: Diagnosis not present

## 2019-10-21 DIAGNOSIS — I10 Essential (primary) hypertension: Secondary | ICD-10-CM

## 2019-10-21 DIAGNOSIS — R4589 Other symptoms and signs involving emotional state: Secondary | ICD-10-CM

## 2019-10-21 NOTE — Progress Notes (Addendum)
Established Patient Office Visit     This visit occurred during the SARS-CoV-2 public health emergency.  Safety protocols were in place, including screening questions prior to the visit, additional usage of staff PPE, and extensive cleaning of exam room while observing appropriate contact time as indicated for disinfecting solutions.    CC/Reason for Visit: Follow-up chronic conditions, discuss depressed mood  HPI: Krista Dawson is a 54 y.o. female who is coming in today for the above mentioned reasons. Past Medical History is significant for: Well-controlled hypertension, hyperlipidemia, hypothyroidism, type 2 diabetes on Metformin and Trulicity as well//transaminitis followed by GI.  She continues her weight loss efforts.  She lost her father in July and has been going through grief/bereavement.  She is considering reaching out to hospice for counseling sessions.  She states that at work her TSH was low and is wondering if this could be contributing to her mood.   Past Medical/Surgical History: Past Medical History:  Diagnosis Date  . Allergy   . Asthma    exercise induced - not used inhaler couple of yrs pt reported 10-03-2018  . Blood transfusion without reported diagnosis   . Colon polyp   . Depression   . DM (diabetes mellitus) ([place])   . GERD (gastroesophageal reflux disease)   . HTN (hypertension)   . Hyperlipidemia   . Hypothyroidism   . NASH (nonalcoholic steatohepatitis)   . Obesity (BMI 30.0-34.9)   . Post menopausal problems   . Sleep apnea    no c-pap  . Transaminitis   . Vitamin D deficiency     Past Surgical History:  Procedure Laterality Date  . ADRENALECTOMY Right   . APPENDECTOMY    . BREAST REDUCTION SURGERY    . CHOLECYSTECTOMY    . HYSTERECTOMY ABDOMINAL WITH SALPINGECTOMY    . LIVER BIOPSY  2008, 2013   NASH  . TONSILLECTOMY    . UPPER GASTROINTESTINAL ENDOSCOPY      Social History:  reports that she quit smoking about 12 years ago.  Her smoking use included cigarettes. She has never used smokeless tobacco. She reports current alcohol use. She reports that she does not use drugs.  Allergies: Allergies  Allergen Reactions  . Spironolactone Hives and Swelling  . Paxil [Paroxetine Hcl] Other (See Comments)    Other reaction(s): Other (comments) WORSENED DEPRESSION. WORSENED DEPRESSION.   Marland Kitchen Codeine Itching and Rash  . Sulfur Rash    Family History:  Family History  Problem Relation Age of Onset  . Breast cancer Mother   . Colon polyps Mother   . Stroke Mother 41  . GER disease Father   . Diabetes Father   . Kidney disease Father   . CAD Father   . Other Father        cause of death listed as resp. failure  . Colon polyps Sister   . Stroke Sister   . Diabetes Maternal Grandfather   . Heart disease Maternal Grandfather   . Diabetes Paternal Grandmother   . CAD Paternal Grandfather   . Pancreatic cancer Paternal Uncle   . Pancreatic cancer Maternal Uncle   . Colon cancer Neg Hx   . Esophageal cancer Neg Hx   . Stomach cancer Neg Hx   . Rectal cancer Neg Hx      Current Outpatient Medications:  .  aspirin EC 81 MG tablet, Take 81 mg by mouth daily., Disp: , Rfl:  .  Blood Glucose Monitoring Suppl (CONTOUR NEXT ONE)  KIT, by Does not apply route., Disp: , Rfl:  .  Cholecalciferol (VITAMIN D3) 50 MCG (2000 UT) TABS, Take by mouth., Disp: , Rfl:  .  clobetasol cream (TEMOVATE) 4.14 %, Apply 1 application topically at bedtime. Apply thin film to external vulvar area nightly x 2 weeks, every other night x 2 weeks, then 2 nights per week, Disp: 30 g, Rfl: 3 .  dicyclomine (BENTYL) 20 MG tablet, Take 1 tablet (20 mg total) by mouth 4 (four) times daily -  before meals and at bedtime. (Patient taking differently: Take 20 mg by mouth 4 (four) times daily -  before meals and at bedtime. Uses prn), Disp: 120 tablet, Rfl: 3 .  Evening Primrose Oil 1000 MG CAPS, Take by mouth., Disp: , Rfl:  .  hydrochlorothiazide  (HYDRODIURIL) 25 MG tablet, TAKE 1 TABLET BY MOUTH DAILY GENERIC EQUIVALENT FOR HYDRODIURIL, Disp: 90 tablet, Rfl: 1 .  levothyroxine (SYNTHROID) 100 MCG tablet, TAKE 1 TABLET BY MOUTH DAILY BEFORE BREAKFAST, Disp: 90 tablet, Rfl: 0 .  levothyroxine (SYNTHROID) 100 MCG tablet, Take 1 tablet (100 mcg total) by mouth daily before breakfast., Disp: 90 tablet, Rfl: 0 .  lisinopril (ZESTRIL) 20 MG tablet, Take 1 tablet (20 mg total) by mouth daily., Disp: 90 tablet, Rfl: 1 .  Melatonin 10 MG CAPS, Take by mouth., Disp: , Rfl:  .  metFORMIN (GLUCOPHAGE) 500 MG tablet, TAKE 2 TABLETS BY MOUTH TWICE DAILY, Disp: 360 tablet, Rfl: 0 .  Microlet Lancets MISC, by Does not apply route. Test 3-4 times daily (Micorlet Colored Lancets), Disp: , Rfl:  .  Omega-3 Fatty Acids (FISH OIL) 1000 MG CAPS, Take by mouth., Disp: , Rfl:  .  omeprazole (PRILOSEC) 40 MG capsule, Take 1 capsule (40 mg total) by mouth daily., Disp: 90 capsule, Rfl: 3 .  rosuvastatin (CRESTOR) 10 MG tablet, Take 1 tablet (10 mg total) by mouth daily., Disp: 90 tablet, Rfl: 0 .  TRULICITY 1.5 EL/9.5VU SOPN, INJECT 1.5 MG UNDER THE SKIN ONCE A WEEK, Disp: 6 mL, Rfl: 2 .  venlafaxine XR (EFFEXOR-XR) 37.5 MG 24 hr capsule, Take 37.5 mg by mouth daily., Disp: , Rfl:   Review of Systems:  Constitutional: Denies fever, chills, diaphoresis, appetite change and fatigue.  HEENT: Denies photophobia, eye pain, redness, hearing loss, ear pain, congestion, sore throat, rhinorrhea, sneezing, mouth sores, trouble swallowing, neck pain, neck stiffness and tinnitus.   Respiratory: Denies SOB, DOE, cough, chest tightness,  and wheezing.   Cardiovascular: Denies chest pain, palpitations and leg swelling.  Gastrointestinal: Denies nausea, vomiting, abdominal pain, diarrhea, constipation, blood in stool and abdominal distention.  Genitourinary: Denies dysuria, urgency, frequency, hematuria, flank pain and difficulty urinating.  Endocrine: Denies: hot or cold  intolerance, sweats, changes in hair or nails, polyuria, polydipsia. Musculoskeletal: Denies myalgias, back pain, joint swelling, arthralgias and gait problem.  Skin: Denies pallor, rash and wound.  Neurological: Denies dizziness, seizures, syncope, weakness, light-headedness, numbness and headaches.  Hematological: Denies adenopathy. Easy bruising, personal or family bleeding history  Psychiatric/Behavioral: Denies suicidal ideation, confusion, nervousness, sleep disturbance and agitation    Physical Exam: Vitals:   10/21/19 0842  BP: 126/84  Pulse: 99  Resp: 16  Temp: 98.7 F (37.1 C)  TempSrc: Oral  SpO2: 96%  Weight: 191 lb 8 oz (86.9 kg)  Height: 5' 5.5" (1.664 m)    Body mass index is 31.38 kg/m.   Constitutional: NAD, calm, comfortable Eyes: PERRL, lids and conjunctivae normal ENMT: Mucous membranes are moist.  Respiratory: clear to auscultation bilaterally, no wheezing, no crackles. Normal respiratory effort. No accessory muscle use.  Cardiovascular: Regular rate and rhythm, no murmurs / rubs / gallops. No extremity edema.  Neurologic: Grossly intact and nonfocal Psychiatric: Normal judgment and insight. Alert and oriented x 3.  Depressed mood.    Impression and Plan:  Hypothyroidism, unspecified type  - Plan: TSH -May need to adjust levothyroxine dosing pending results.  NASH (nonalcoholic steatohepatitis) Transaminitis -Stable, followed by GI.  Type 2 diabetes mellitus without complication, without long-term current use of insulin ([place]) -Well-controlled, most recent A1c was 5.8 in July.  Primary hypertension -Well-controlled.  Hyperlipidemia, unspecified hyperlipidemia type -Well-controlled with an LDL of 66 and November 2020.  Depressed mood  -Mood changes are appropriate given the recent passing of the left one. -Have encouraged her to obtain grief counseling through hospice, will check TSH, B12, vitamin D. -She will follow-up with me in 3  months.  Need for influenza vaccination -Flu vaccine administered today.   Patient Instructions  -Nice seeing you today!!  -Lab work today; will notify you once results are available.  -Schedule follow up in 3-4 months.     Lelon Frohlich, MD [facility]

## 2019-10-21 NOTE — Patient Instructions (Signed)
-  Nice seeing you today!!  -Lab work today; will notify you once results are available.  -Schedule follow up in 3-4 months.

## 2019-10-21 NOTE — Addendum Note (Signed)
Addended by: Marrion Coy on: 10/21/2019 10:08 AM   Modules accepted: Orders

## 2019-10-22 ENCOUNTER — Other Ambulatory Visit: Payer: Self-pay | Admitting: Internal Medicine

## 2019-10-22 DIAGNOSIS — E559 Vitamin D deficiency, unspecified: Secondary | ICD-10-CM

## 2019-10-22 DIAGNOSIS — E039 Hypothyroidism, unspecified: Secondary | ICD-10-CM

## 2019-10-22 LAB — VITAMIN D 25 HYDROXY (VIT D DEFICIENCY, FRACTURES): Vit D, 25-Hydroxy: 39 ng/mL (ref 30–100)

## 2019-10-22 LAB — VITAMIN B12: Vitamin B-12: 350 pg/mL (ref 200–1100)

## 2019-10-22 LAB — TSH: TSH: 0.38 mIU/L — ABNORMAL LOW

## 2019-10-22 MED ORDER — VITAMIN D (ERGOCALCIFEROL) 1.25 MG (50000 UNIT) PO CAPS: 50000.0000 [IU] | ORAL_CAPSULE | ORAL | 0 refills | Status: AC

## 2019-10-22 MED ORDER — LEVOTHYROXINE SODIUM 88 MCG PO TABS: 88.0000 ug | ORAL_TABLET | Freq: Every day | ORAL | 1 refills | Status: DC

## 2019-10-23 ENCOUNTER — Encounter: Payer: Self-pay | Admitting: Internal Medicine

## 2019-10-27 ENCOUNTER — Other Ambulatory Visit: Payer: Self-pay | Admitting: Internal Medicine

## 2019-10-27 DIAGNOSIS — E559 Vitamin D deficiency, unspecified: Secondary | ICD-10-CM

## 2019-11-25 ENCOUNTER — Encounter: Payer: Self-pay | Admitting: Internal Medicine

## 2019-11-25 ENCOUNTER — Other Ambulatory Visit: Payer: Self-pay

## 2019-11-25 ENCOUNTER — Ambulatory Visit (INDEPENDENT_AMBULATORY_CARE_PROVIDER_SITE_OTHER): Payer: BC Managed Care – PPO | Admitting: Internal Medicine

## 2019-11-25 VITALS — BP 110/80 | HR 79 | Temp 98.3°F | Ht 65.5 in | Wt 192.7 lb

## 2019-11-25 DIAGNOSIS — I10 Essential (primary) hypertension: Secondary | ICD-10-CM | POA: Diagnosis not present

## 2019-11-25 DIAGNOSIS — E039 Hypothyroidism, unspecified: Secondary | ICD-10-CM

## 2019-11-25 DIAGNOSIS — E559 Vitamin D deficiency, unspecified: Secondary | ICD-10-CM

## 2019-11-25 DIAGNOSIS — E119 Type 2 diabetes mellitus without complications: Secondary | ICD-10-CM | POA: Diagnosis not present

## 2019-11-25 DIAGNOSIS — Z23 Encounter for immunization: Secondary | ICD-10-CM

## 2019-11-25 DIAGNOSIS — E669 Obesity, unspecified: Secondary | ICD-10-CM

## 2019-11-25 DIAGNOSIS — E785 Hyperlipidemia, unspecified: Secondary | ICD-10-CM | POA: Diagnosis not present

## 2019-11-25 DIAGNOSIS — K7581 Nonalcoholic steatohepatitis (NASH): Secondary | ICD-10-CM

## 2019-11-25 DIAGNOSIS — R7401 Elevation of levels of liver transaminase levels: Secondary | ICD-10-CM

## 2019-11-25 DIAGNOSIS — Z0000 Encounter for general adult medical examination without abnormal findings: Secondary | ICD-10-CM | POA: Diagnosis not present

## 2019-11-25 LAB — POCT GLYCOSYLATED HEMOGLOBIN (HGB A1C): Hemoglobin A1C: 6 % — AB (ref 4.0–5.6)

## 2019-11-25 MED ORDER — LISINOPRIL 20 MG PO TABS
20.0000 mg | ORAL_TABLET | Freq: Every day | ORAL | 1 refills | Status: DC
Start: 2019-11-25 — End: 2019-11-27

## 2019-11-25 MED ORDER — METFORMIN HCL 500 MG PO TABS
1000.0000 mg | ORAL_TABLET | Freq: Two times a day (BID) | ORAL | 1 refills | Status: DC
Start: 2019-11-25 — End: 2019-11-27

## 2019-11-25 MED ORDER — VENLAFAXINE HCL ER 37.5 MG PO CP24
37.5000 mg | ORAL_CAPSULE | Freq: Every day | ORAL | 1 refills | Status: DC
Start: 2019-11-25 — End: 2019-11-27

## 2019-11-25 MED ORDER — ROSUVASTATIN CALCIUM 10 MG PO TABS
10.0000 mg | ORAL_TABLET | Freq: Every day | ORAL | 1 refills | Status: DC
Start: 2019-11-25 — End: 2019-11-27

## 2019-11-25 MED ORDER — HYDROCHLOROTHIAZIDE 25 MG PO TABS: ORAL_TABLET | ORAL | 1 refills | Status: DC

## 2019-11-25 NOTE — Patient Instructions (Signed)
-Nice seeing you today!!  -Return fasting for labs.  -Pneumonia vaccine today.  -Schedule follow up in 3 months.   Preventive Care 43-52 Years Old, Female Preventive care refers to visits with your health care provider and lifestyle choices that can promote health and wellness. This includes:  A yearly physical exam. This may also be called an annual well check.  Regular dental visits and eye exams.  Immunizations.  Screening for certain conditions.  Healthy lifestyle choices, such as eating a healthy diet, getting regular exercise, not using drugs or products that contain nicotine and tobacco, and limiting alcohol use. What can I expect for my preventive care visit? Physical exam Your health care provider will check your:  Height and weight. This may be used to calculate body mass index (BMI), which tells if you are at a healthy weight.  Heart rate and blood pressure.  Skin for abnormal spots. Counseling Your health care provider may ask you questions about your:  Alcohol, tobacco, and drug use.  Emotional well-being.  Home and relationship well-being.  Sexual activity.  Eating habits.  Work and work Statistician.  Method of birth control.  Menstrual cycle.  Pregnancy history. What immunizations do I need?  Influenza (flu) vaccine  This is recommended every year. Tetanus, diphtheria, and pertussis (Tdap) vaccine  You may need a Td booster every 10 years. Varicella (chickenpox) vaccine  You may need this if you have not been vaccinated. Zoster (shingles) vaccine  You may need this after age 14. Measles, mumps, and rubella (MMR) vaccine  You may need at least one dose of MMR if you were born in 1957 or later. You may also need a second dose. Pneumococcal conjugate (PCV13) vaccine  You may need this if you have certain conditions and were not previously vaccinated. Pneumococcal polysaccharide (PPSV23) vaccine  You may need one or two doses if you  smoke cigarettes or if you have certain conditions. Meningococcal conjugate (MenACWY) vaccine  You may need this if you have certain conditions. Hepatitis A vaccine  You may need this if you have certain conditions or if you travel or work in places where you may be exposed to hepatitis A. Hepatitis B vaccine  You may need this if you have certain conditions or if you travel or work in places where you may be exposed to hepatitis B. Haemophilus influenzae type b (Hib) vaccine  You may need this if you have certain conditions. Human papillomavirus (HPV) vaccine  If recommended by your health care provider, you may need three doses over 6 months. You may receive vaccines as individual doses or as more than one vaccine together in one shot (combination vaccines). Talk with your health care provider about the risks and benefits of combination vaccines. What tests do I need? Blood tests  Lipid and cholesterol levels. These may be checked every 5 years, or more frequently if you are over 51 years old.  Hepatitis C test.  Hepatitis B test. Screening  Lung cancer screening. You may have this screening every year starting at age 30 if you have a 30-pack-year history of smoking and currently smoke or have quit within the past 15 years.  Colorectal cancer screening. All adults should have this screening starting at age 55 and continuing until age 5. Your health care provider may recommend screening at age 58 if you are at increased risk. You will have tests every 1-10 years, depending on your results and the type of screening test.  Diabetes screening.  This is done by checking your blood sugar (glucose) after you have not eaten for a while (fasting). You may have this done every 1-3 years.  Mammogram. This may be done every 1-2 years. Talk with your health care provider about when you should start having regular mammograms. This may depend on whether you have a family history of breast  cancer.  BRCA-related cancer screening. This may be done if you have a family history of breast, ovarian, tubal, or peritoneal cancers.  Pelvic exam and Pap test. This may be done every 3 years starting at age 28. Starting at age 28, this may be done every 5 years if you have a Pap test in combination with an HPV test. Other tests  Sexually transmitted disease (STD) testing.  Bone density scan. This is done to screen for osteoporosis. You may have this scan if you are at high risk for osteoporosis. Follow these instructions at home: Eating and drinking  Eat a diet that includes fresh fruits and vegetables, whole grains, lean protein, and low-fat dairy.  Take vitamin and mineral supplements as recommended by your health care provider.  Do not drink alcohol if: ? Your health care provider tells you not to drink. ? You are pregnant, may be pregnant, or are planning to become pregnant.  If you drink alcohol: ? Limit how much you have to 0-1 drink a day. ? Be aware of how much alcohol is in your drink. In the [place] one drink equals one 12 oz bottle of beer (355 mL), one 5 oz glass of wine (148 mL), or one 1 oz glass of hard liquor (44 mL). Lifestyle  Take daily care of your teeth and gums.  Stay active. Exercise for at least 30 minutes on 5 or more days each week.  Do not use any products that contain nicotine or tobacco, such as cigarettes, e-cigarettes, and chewing tobacco. If you need help quitting, ask your health care provider.  If you are sexually active, practice safe sex. Use a condom or other form of birth control (contraception) in order to prevent pregnancy and STIs (sexually transmitted infections).  If told by your health care provider, take low-dose aspirin daily starting at age 24. What's next?  Visit your health care provider once a year for a well check visit.  Ask your health care provider how often you should have your eyes and teeth checked.  Stay up to date  on all vaccines. This information is not intended to replace advice given to you by your health care provider. Make sure you discuss any questions you have with your health care provider. Document Revised: 09/05/2017 Document Reviewed: 09/05/2017 Elsevier Patient Education  2020 [employer].

## 2019-11-25 NOTE — Addendum Note (Signed)
Addended by: Westley Hummer B on: 11/25/2019 05:17 PM   Modules accepted: Orders

## 2019-11-25 NOTE — Addendum Note (Signed)
Addended by: Westley Hummer B on: 11/25/2019 10:00 AM   Modules accepted: Orders

## 2019-11-25 NOTE — Progress Notes (Signed)
Established Patient Office Visit     This visit occurred during the SARS-CoV-2 public health emergency.  Safety protocols were in place, including screening questions prior to the visit, additional usage of staff PPE, and extensive cleaning of exam room while observing appropriate contact time as indicated for disinfecting solutions.    CC/Reason for Visit: Annual preventive exam  HPI: Krista Dawson is a 54 y.o. female who is coming in today for the above mentioned reasons. Past Medical History is significant for: Well-controlled hypertension, hyperlipidemia, hypothyroidism, type 2 diabetes on Metformin and Trulicity as well//transaminitis followed by GI.  At last visit her TSH was low and she reduce the dose of her levothyroxine from 100 to 88 mcg daily.  She lost her father over the summer and is still going through some grief/bereavement.  She feels like her mood has improved.  She had her flu vaccine in October.  She is due for a pneumonia update, she needs to schedule her Covid booster.  She had a colonoscopy in 2020 and is a 5-year callback.  Last mammogram we have on file is from February 2020, she typically has these done in [place] and will get copies for [place].  She has established care with local gynecology.  She has her eye exam scheduled for January   Past Medical/Surgical History: Past Medical History:  Diagnosis Date  . Allergy   . Asthma    exercise induced - not used inhaler couple of yrs pt reported 10-03-2018  . Blood transfusion without reported diagnosis   . Colon polyp   . Depression   . DM (diabetes mellitus) ([place])   . GERD (gastroesophageal reflux disease)   . HTN (hypertension)   . Hyperlipidemia   . Hypothyroidism   . NASH (nonalcoholic steatohepatitis)   . Obesity (BMI 30.0-34.9)   . Post menopausal problems   . Sleep apnea    no c-pap  . Transaminitis   . Vitamin D deficiency     Past Surgical History:  Procedure Laterality Date  .  ADRENALECTOMY Right   . APPENDECTOMY    . BREAST REDUCTION SURGERY    . CHOLECYSTECTOMY    . HYSTERECTOMY ABDOMINAL WITH SALPINGECTOMY    . LIVER BIOPSY  2008, 2013   NASH  . TONSILLECTOMY    . UPPER GASTROINTESTINAL ENDOSCOPY      Social History:  reports that she quit smoking about 12 years ago. Her smoking use included cigarettes. She has never used smokeless tobacco. She reports current alcohol use. She reports that she does not use drugs.  Allergies: Allergies  Allergen Reactions  . Spironolactone Hives and Swelling  . Paxil [Paroxetine Hcl] Other (See Comments)    Other reaction(s): Other (comments) WORSENED DEPRESSION. WORSENED DEPRESSION.   Marland Kitchen Codeine Itching and Rash  . Sulfur Rash    Family History:  Family History  Problem Relation Age of Onset  . Breast cancer Mother   . Colon polyps Mother   . Stroke Mother 32  . GER disease Father   . Diabetes Father   . Kidney disease Father   . CAD Father   . Other Father        cause of death listed as resp. failure  . Colon polyps Sister   . Stroke Sister   . Diabetes Maternal Grandfather   . Heart disease Maternal Grandfather   . Diabetes Paternal Grandmother   . CAD Paternal Grandfather   . Pancreatic cancer Paternal Uncle   .  Pancreatic cancer Maternal Uncle   . Colon cancer Neg Hx   . Esophageal cancer Neg Hx   . Stomach cancer Neg Hx   . Rectal cancer Neg Hx      Current Outpatient Medications:  .  aspirin EC 81 MG tablet, Take 81 mg by mouth daily., Disp: , Rfl:  .  Blood Glucose Monitoring Suppl (CONTOUR NEXT ONE) KIT, by Does not apply route., Disp: , Rfl:  .  Cholecalciferol (VITAMIN D3) 50 MCG (2000 UT) TABS, Take by mouth., Disp: , Rfl:  .  clobetasol cream (TEMOVATE) 5.36 %, Apply 1 application topically at bedtime. Apply thin film to external vulvar area nightly x 2 weeks, every other night x 2 weeks, then 2 nights per week, Disp: 30 g, Rfl: 3 .  dicyclomine (BENTYL) 20 MG tablet, Take 1 tablet  (20 mg total) by mouth 4 (four) times daily -  before meals and at bedtime. (Patient taking differently: Take 20 mg by mouth 4 (four) times daily -  before meals and at bedtime. Uses prn), Disp: 120 tablet, Rfl: 3 .  Evening Primrose Oil 1000 MG CAPS, Take by mouth., Disp: , Rfl:  .  hydrochlorothiazide (HYDRODIURIL) 25 MG tablet, TAKE 1 TABLET BY MOUTH DAILY GENERIC EQUIVALENT FOR HYDRODIURIL, Disp: 90 tablet, Rfl: 1 .  levothyroxine (SYNTHROID) 88 MCG tablet, Take 1 tablet (88 mcg total) by mouth daily before breakfast., Disp: 90 tablet, Rfl: 1 .  lisinopril (ZESTRIL) 20 MG tablet, Take 1 tablet (20 mg total) by mouth daily., Disp: 90 tablet, Rfl: 1 .  Melatonin 10 MG CAPS, Take by mouth., Disp: , Rfl:  .  metFORMIN (GLUCOPHAGE) 500 MG tablet, TAKE 2 TABLETS BY MOUTH TWICE DAILY, Disp: 360 tablet, Rfl: 0 .  Microlet Lancets MISC, by Does not apply route. Test 3-4 times daily (Micorlet Colored Lancets), Disp: , Rfl:  .  Omega-3 Fatty Acids (FISH OIL) 1000 MG CAPS, Take by mouth., Disp: , Rfl:  .  omeprazole (PRILOSEC) 40 MG capsule, Take 1 capsule (40 mg total) by mouth daily., Disp: 90 capsule, Rfl: 3 .  rosuvastatin (CRESTOR) 10 MG tablet, Take 1 tablet (10 mg total) by mouth daily., Disp: 90 tablet, Rfl: 0 .  TRULICITY 1.5 RW/4.3XV SOPN, INJECT 1.5 MG UNDER THE SKIN ONCE A WEEK, Disp: 6 mL, Rfl: 2 .  venlafaxine XR (EFFEXOR-XR) 37.5 MG 24 hr capsule, Take 37.5 mg by mouth daily., Disp: , Rfl:  .  Vitamin D, Ergocalciferol, (DRISDOL) 1.25 MG (50000 UNIT) CAPS capsule, Take 1 capsule (50,000 Units total) by mouth every 7 (seven) days for 12 doses., Disp: 12 capsule, Rfl: 0  Review of Systems:  Constitutional: Denies fever, chills, diaphoresis, appetite change and fatigue.  HEENT: Denies photophobia, eye pain, redness, hearing loss, ear pain, congestion, sore throat, rhinorrhea, sneezing, mouth sores, trouble swallowing, neck pain, neck stiffness and tinnitus.   Respiratory: Denies SOB, DOE,  cough, chest tightness,  and wheezing.   Cardiovascular: Denies chest pain, palpitations and leg swelling.  Gastrointestinal: Denies nausea, vomiting, abdominal pain, diarrhea, constipation, blood in stool and abdominal distention.  Genitourinary: Denies dysuria, urgency, frequency, hematuria, flank pain and difficulty urinating.  Endocrine: Denies: hot or cold intolerance, sweats, changes in hair or nails, polyuria, polydipsia. Musculoskeletal: Denies myalgias, back pain, joint swelling, arthralgias and gait problem.  Skin: Denies pallor, rash and wound.  Neurological: Denies dizziness, seizures, syncope, weakness, light-headedness, numbness and headaches.  Hematological: Denies adenopathy. Easy bruising, personal or family bleeding history  Psychiatric/Behavioral: Denies  suicidal ideation, mood changes, confusion, nervousness, sleep disturbance and agitation    Physical Exam: Vitals:   11/25/19 0832  BP: 110/80  Pulse: 79  Temp: 98.3 F (36.8 C)  TempSrc: Oral  Weight: 192 lb 11.2 oz (87.4 kg)  Height: 5' 5.5" (1.664 m)    Body mass index is 31.58 kg/m.   Constitutional: NAD, calm, comfortable Eyes: PERRL, lids and conjunctivae normal, wears corrective lenses ENMT: Mucous membranes are moist. Posterior pharynx clear of any exudate or lesions. Normal dentition. Tympanic membrane is pearly white, no erythema or bulging. Neck: normal, supple, no masses, no thyromegaly Respiratory: clear to auscultation bilaterally, no wheezing, no crackles. Normal respiratory effort. No accessory muscle use.  Cardiovascular: Regular rate and rhythm, no murmurs / rubs / gallops. No extremity edema. 2+ pedal pulses. Abdomen: no tenderness, no masses palpated. No hepatosplenomegaly. Bowel sounds positive.  Musculoskeletal: no clubbing / cyanosis. No joint deformity upper and lower extremities. Good ROM, no contractures. Normal muscle tone.  Skin: no rashes, lesions, ulcers. No induration Neurologic:  CN 2-12 grossly intact. Sensation intact, DTR normal. Strength 5/5 in all 4.  Psychiatric: Normal judgment and insight. Alert and oriented x 3. Normal mood.    Impression and Plan:  Encounter for preventive health examination  -She has routine eye and dental care. -She will get a Pneumovax today, she needs her Covid booster but otherwise immunizations are up-to-date. -She will return fasting for screening labs. -Healthy lifestyle has been discussed in detail.  - She was told by GYN that she no longer needs a Pap smear due to her having a total abdominal hysterectomy with bilateral salpingo oophorectomy. -She had a colonoscopy in 2020 and is a 5-year callback. -She alternates mammograms with breast MRIs every due to a strong family history of breast cancer.  She will get his records from [place] for [place].  Type 2 diabetes mellitus without complication, without long-term current use of insulin (HCC) -Well-controlled with an A1c of 6.0 today.  Primary hypertension -Well-controlled on lisinopril.  Hyperlipidemia, unspecified hyperlipidemia type  - Plan: Lipid panel -Last LDL was 66 in November 2020, goal less than 70.  Hypothyroidism, unspecified type  - Plan: TSH, 6 weeks ago her levothyroxine dose was adjusted due to an over suppressed thyroid.  NASH (nonalcoholic steatohepatitis) Transaminitis -Recheck LFTs today, they have been stable and followed by GI.  Obesity (BMI 30.0-34.9) -Discussed healthy lifestyle, including increased physical activity and better food choices to promote weight loss.  Need for vaccination against Streptococcus pneumoniae -PPSV23 administered today.  Vitamin D deficiency  - Plan: VITAMIN D 25 Hydroxy (Vit-D Deficiency, Fractures)   Patient Instructions  -Nice seeing you today!!  -Return fasting for labs.  -Pneumonia vaccine today.  -Schedule follow up in 3 months.   Preventive Care 67-20 Years Old, Female Preventive care refers to visits  with your health care provider and lifestyle choices that can promote health and wellness. This includes:  A yearly physical exam. This may also be called an annual well check.  Regular dental visits and eye exams.  Immunizations.  Screening for certain conditions.  Healthy lifestyle choices, such as eating a healthy diet, getting regular exercise, not using drugs or products that contain nicotine and tobacco, and limiting alcohol use. What can I expect for my preventive care visit? Physical exam Your health care provider will check your:  Height and weight. This may be used to calculate body mass index (BMI), which tells if you are at a healthy weight.  Heart rate and blood pressure.  Skin for abnormal spots. Counseling Your health care provider may ask you questions about your:  Alcohol, tobacco, and drug use.  Emotional well-being.  Home and relationship well-being.  Sexual activity.  Eating habits.  Work and work Statistician.  Method of birth control.  Menstrual cycle.  Pregnancy history. What immunizations do I need?  Influenza (flu) vaccine  This is recommended every year. Tetanus, diphtheria, and pertussis (Tdap) vaccine  You may need a Td booster every 10 years. Varicella (chickenpox) vaccine  You may need this if you have not been vaccinated. Zoster (shingles) vaccine  You may need this after age 52. Measles, mumps, and rubella (MMR) vaccine  You may need at least one dose of MMR if you were born in 1957 or later. You may also need a second dose. Pneumococcal conjugate (PCV13) vaccine  You may need this if you have certain conditions and were not previously vaccinated. Pneumococcal polysaccharide (PPSV23) vaccine  You may need one or two doses if you smoke cigarettes or if you have certain conditions. Meningococcal conjugate (MenACWY) vaccine  You may need this if you have certain conditions. Hepatitis A vaccine  You may need this if you  have certain conditions or if you travel or work in places where you may be exposed to hepatitis A. Hepatitis B vaccine  You may need this if you have certain conditions or if you travel or work in places where you may be exposed to hepatitis B. Haemophilus influenzae type b (Hib) vaccine  You may need this if you have certain conditions. Human papillomavirus (HPV) vaccine  If recommended by your health care provider, you may need three doses over 6 months. You may receive vaccines as individual doses or as more than one vaccine together in one shot (combination vaccines). Talk with your health care provider about the risks and benefits of combination vaccines. What tests do I need? Blood tests  Lipid and cholesterol levels. These may be checked every 5 years, or more frequently if you are over 17 years old.  Hepatitis C test.  Hepatitis B test. Screening  Lung cancer screening. You may have this screening every year starting at age 84 if you have a 30-pack-year history of smoking and currently smoke or have quit within the past 15 years.  Colorectal cancer screening. All adults should have this screening starting at age 16 and continuing until age 19. Your health care provider may recommend screening at age 25 if you are at increased risk. You will have tests every 1-10 years, depending on your results and the type of screening test.  Diabetes screening. This is done by checking your blood sugar (glucose) after you have not eaten for a while (fasting). You may have this done every 1-3 years.  Mammogram. This may be done every 1-2 years. Talk with your health care provider about when you should start having regular mammograms. This may depend on whether you have a family history of breast cancer.  BRCA-related cancer screening. This may be done if you have a family history of breast, ovarian, tubal, or peritoneal cancers.  Pelvic exam and Pap test. This may be done every 3 years  starting at age 63. Starting at age 38, this may be done every 5 years if you have a Pap test in combination with an HPV test. Other tests  Sexually transmitted disease (STD) testing.  Bone density scan. This is done to screen for osteoporosis. You may have  this scan if you are at high risk for osteoporosis. Follow these instructions at home: Eating and drinking  Eat a diet that includes fresh fruits and vegetables, whole grains, lean protein, and low-fat dairy.  Take vitamin and mineral supplements as recommended by your health care provider.  Do not drink alcohol if: ? Your health care provider tells you not to drink. ? You are pregnant, may be pregnant, or are planning to become pregnant.  If you drink alcohol: ? Limit how much you have to 0-1 drink a day. ? Be aware of how much alcohol is in your drink. In the [place] one drink equals one 12 oz bottle of beer (355 mL), one 5 oz glass of wine (148 mL), or one 1 oz glass of hard liquor (44 mL). Lifestyle  Take daily care of your teeth and gums.  Stay active. Exercise for at least 30 minutes on 5 or more days each week.  Do not use any products that contain nicotine or tobacco, such as cigarettes, e-cigarettes, and chewing tobacco. If you need help quitting, ask your health care provider.  If you are sexually active, practice safe sex. Use a condom or other form of birth control (contraception) in order to prevent pregnancy and STIs (sexually transmitted infections).  If told by your health care provider, take low-dose aspirin daily starting at age 52. What's next?  Visit your health care provider once a year for a well check visit.  Ask your health care provider how often you should have your eyes and teeth checked.  Stay up to date on all vaccines. This information is not intended to replace advice given to you by your health care provider. Make sure you discuss any questions you have with your health care provider. Document  Revised: 09/05/2017 Document Reviewed: 09/05/2017 Elsevier Patient Education  2020 [place], [place] [facility]

## 2019-11-26 ENCOUNTER — Encounter: Payer: Self-pay | Admitting: Internal Medicine

## 2019-11-27 ENCOUNTER — Other Ambulatory Visit: Payer: Self-pay

## 2019-11-27 ENCOUNTER — Other Ambulatory Visit (INDEPENDENT_AMBULATORY_CARE_PROVIDER_SITE_OTHER): Payer: BC Managed Care – PPO

## 2019-11-27 ENCOUNTER — Encounter: Payer: Self-pay | Admitting: Obstetrics and Gynecology

## 2019-11-27 DIAGNOSIS — E559 Vitamin D deficiency, unspecified: Secondary | ICD-10-CM

## 2019-11-27 DIAGNOSIS — E119 Type 2 diabetes mellitus without complications: Secondary | ICD-10-CM

## 2019-11-27 DIAGNOSIS — E039 Hypothyroidism, unspecified: Secondary | ICD-10-CM

## 2019-11-27 DIAGNOSIS — E785 Hyperlipidemia, unspecified: Secondary | ICD-10-CM

## 2019-11-27 DIAGNOSIS — Z0000 Encounter for general adult medical examination without abnormal findings: Secondary | ICD-10-CM

## 2019-11-27 LAB — CBC WITH DIFFERENTIAL/PLATELET
Basophils Absolute: 0 10*3/uL (ref 0.0–0.1)
Basophils Relative: 0.4 % (ref 0.0–3.0)
Eosinophils Absolute: 0.3 10*3/uL (ref 0.0–0.7)
Eosinophils Relative: 4.4 % (ref 0.0–5.0)
HCT: 39.3 % (ref 36.0–46.0)
Hemoglobin: 13 g/dL (ref 12.0–15.0)
Lymphocytes Relative: 34.9 % (ref 12.0–46.0)
Lymphs Abs: 2.3 10*3/uL (ref 0.7–4.0)
MCHC: 33.2 g/dL (ref 30.0–36.0)
MCV: 85.3 fl (ref 78.0–100.0)
Monocytes Absolute: 0.3 10*3/uL (ref 0.1–1.0)
Monocytes Relative: 5.1 % (ref 3.0–12.0)
Neutro Abs: 3.7 10*3/uL (ref 1.4–7.7)
Neutrophils Relative %: 55.2 % (ref 43.0–77.0)
Platelets: 244 10*3/uL (ref 150.0–400.0)
RBC: 4.61 Mil/uL (ref 3.87–5.11)
RDW: 14 % (ref 11.5–15.5)
WBC: 6.7 10*3/uL (ref 4.0–10.5)

## 2019-11-27 LAB — COMPREHENSIVE METABOLIC PANEL
ALT: 27 U/L (ref 0–35)
AST: 20 U/L (ref 0–37)
Albumin: 4.4 g/dL (ref 3.5–5.2)
Alkaline Phosphatase: 75 U/L (ref 39–117)
BUN: 22 mg/dL (ref 6–23)
CO2: 27 mEq/L (ref 19–32)
Calcium: 10.2 mg/dL (ref 8.4–10.5)
Chloride: 106 mEq/L (ref 96–112)
Creatinine, Ser: 0.9 mg/dL (ref 0.40–1.20)
GFR: 73.51 mL/min (ref >60.00)
Glucose, Bld: 134 mg/dL — ABNORMAL HIGH (ref 70–99)
Potassium: 5 mEq/L (ref 3.5–5.1)
Sodium: 140 mEq/L (ref 135–145)
Total Bilirubin: 0.3 mg/dL (ref 0.2–1.2)
Total Protein: 6.6 g/dL (ref 6.0–8.3)

## 2019-11-27 LAB — LIPID PANEL
Cholesterol: 148 mg/dL (ref 0–200)
HDL: 48.6 mg/dL (ref >39.00)
LDL Cholesterol: 71 mg/dL (ref 0–99)
NonHDL: 99.32
Total CHOL/HDL Ratio: 3
Triglycerides: 140 mg/dL (ref 0.0–149.0)
VLDL: 28 mg/dL (ref 0.0–40.0)

## 2019-11-27 LAB — TSH: TSH: 1.14 u[IU]/mL (ref 0.35–4.50)

## 2019-11-27 LAB — VITAMIN B12: Vitamin B-12: 235 pg/mL (ref 211–911)

## 2019-11-27 LAB — VITAMIN D 25 HYDROXY (VIT D DEFICIENCY, FRACTURES): VITD: 43.03 ng/mL (ref 30.00–100.00)

## 2019-11-27 MED ORDER — ROSUVASTATIN CALCIUM 10 MG PO TABS
10.0000 mg | ORAL_TABLET | Freq: Every day | ORAL | 1 refills | Status: DC
Start: 2019-11-27 — End: 2020-06-07

## 2019-11-27 MED ORDER — METFORMIN HCL 500 MG PO TABS
1000.0000 mg | ORAL_TABLET | Freq: Two times a day (BID) | ORAL | 1 refills | Status: DC
Start: 2019-11-27 — End: 2020-09-22

## 2019-11-27 MED ORDER — VENLAFAXINE HCL ER 37.5 MG PO CP24
37.5000 mg | ORAL_CAPSULE | Freq: Every day | ORAL | 1 refills | Status: DC
Start: 2019-11-27 — End: 2020-06-07

## 2019-11-27 MED ORDER — LISINOPRIL 20 MG PO TABS
20.0000 mg | ORAL_TABLET | Freq: Every day | ORAL | 1 refills | Status: DC
Start: 2019-11-27 — End: 2020-06-07

## 2019-11-27 MED ORDER — HYDROCHLOROTHIAZIDE 25 MG PO TABS: ORAL_TABLET | ORAL | 1 refills | Status: DC

## 2019-11-27 NOTE — Addendum Note (Signed)
Addended by: Marrion Coy on: 11/27/2019 07:21 AM   Modules accepted: Orders

## 2019-11-27 NOTE — Telephone Encounter (Signed)
Chart has been updated.

## 2019-12-30 ENCOUNTER — Other Ambulatory Visit: Payer: Self-pay | Admitting: Internal Medicine

## 2019-12-31 ENCOUNTER — Other Ambulatory Visit: Payer: BC Managed Care – PPO

## 2020-02-04 ENCOUNTER — Other Ambulatory Visit: Payer: Self-pay

## 2020-02-04 DIAGNOSIS — R1013 Epigastric pain: Secondary | ICD-10-CM

## 2020-02-04 MED ORDER — OMEPRAZOLE 40 MG PO CPDR: 40.0000 mg | DELAYED_RELEASE_CAPSULE | Freq: Every day | ORAL | 3 refills | Status: DC

## 2020-02-24 ENCOUNTER — Other Ambulatory Visit: Payer: Self-pay

## 2020-02-25 ENCOUNTER — Ambulatory Visit: Payer: BC Managed Care – PPO | Admitting: Internal Medicine

## 2020-03-09 ENCOUNTER — Other Ambulatory Visit: Payer: Self-pay

## 2020-03-10 ENCOUNTER — Encounter: Payer: Self-pay | Admitting: Internal Medicine

## 2020-03-10 ENCOUNTER — Ambulatory Visit (INDEPENDENT_AMBULATORY_CARE_PROVIDER_SITE_OTHER): Payer: BC Managed Care – PPO | Admitting: Internal Medicine

## 2020-03-10 VITALS — BP 110/70 | HR 81 | Temp 98.4°F | Wt 198.5 lb

## 2020-03-10 DIAGNOSIS — I10 Essential (primary) hypertension: Secondary | ICD-10-CM

## 2020-03-10 DIAGNOSIS — K7581 Nonalcoholic steatohepatitis (NASH): Secondary | ICD-10-CM | POA: Diagnosis not present

## 2020-03-10 DIAGNOSIS — E039 Hypothyroidism, unspecified: Secondary | ICD-10-CM

## 2020-03-10 DIAGNOSIS — E559 Vitamin D deficiency, unspecified: Secondary | ICD-10-CM

## 2020-03-10 DIAGNOSIS — R7401 Elevation of levels of liver transaminase levels: Secondary | ICD-10-CM

## 2020-03-10 DIAGNOSIS — E785 Hyperlipidemia, unspecified: Secondary | ICD-10-CM | POA: Diagnosis not present

## 2020-03-10 DIAGNOSIS — E119 Type 2 diabetes mellitus without complications: Secondary | ICD-10-CM | POA: Diagnosis not present

## 2020-03-10 DIAGNOSIS — E669 Obesity, unspecified: Secondary | ICD-10-CM

## 2020-03-10 LAB — POCT GLYCOSYLATED HEMOGLOBIN (HGB A1C): Hemoglobin A1C: 6.3 % — AB (ref 4.0–5.6)

## 2020-03-10 MED ORDER — LEVOTHYROXINE SODIUM 88 MCG PO TABS: 88.0000 ug | ORAL_TABLET | Freq: Every day | ORAL | 1 refills | Status: DC

## 2020-03-10 NOTE — Patient Instructions (Signed)
-  Nice seeing you today!!  -Schedule follow up in 3 months.

## 2020-03-10 NOTE — Progress Notes (Signed)
Established Patient Office Visit     This visit occurred during the SARS-CoV-2 public health emergency.  Safety protocols were in place, including screening questions prior to the visit, additional usage of staff PPE, and extensive cleaning of exam room while observing appropriate contact time as indicated for disinfecting solutions.    CC/Reason for Visit: 12-monthfollow-up chronic medical conditions  HPI: Krista Dawson a 55y.o. female who is coming in today for the above mentioned reasons. Past Medical History is significant for: Well-controlled hypertension, hyperlipidemia, hypothyroidism, type 2 diabetes on Metformin and Trulicity as well//transaminitis followed by GI.  She has been doing well and has no acute complaints.  She notes that she has been a little noncompliant with diet and exercise, she plans on changing this in the next 3 to 6 months.  She would like to join a gym.  She is requesting refills of her levothyroxine.   Past Medical/Surgical History: Past Medical History:  Diagnosis Date  . Allergy   . Asthma    exercise induced - not used inhaler couple of yrs pt reported 10-03-2018  . Blood transfusion without reported diagnosis   . Colon polyp   . Depression   . DM (diabetes mellitus) ([facility]   . GERD (gastroesophageal reflux disease)   . HTN (hypertension)   . Hyperlipidemia   . Hypothyroidism   . NASH (nonalcoholic steatohepatitis)   . Obesity (BMI 30.0-34.9)   . Post menopausal problems   . Sleep apnea    no c-pap  . Transaminitis   . Vitamin D deficiency     Past Surgical History:  Procedure Laterality Date  . ADRENALECTOMY Right   . APPENDECTOMY    . BREAST REDUCTION SURGERY    . CHOLECYSTECTOMY    . HYSTERECTOMY ABDOMINAL WITH SALPINGECTOMY    . LIVER BIOPSY  2008, 2013   NASH  . TONSILLECTOMY    . UPPER GASTROINTESTINAL ENDOSCOPY      Social History:  reports that she quit smoking about 13 years ago. Her smoking use included  cigarettes. She has never used smokeless tobacco. She reports current alcohol use. She reports that she does not use drugs.  Allergies: Allergies  Allergen Reactions  . Spironolactone Hives and Swelling  . Paxil [Paroxetine Hcl] Other (See Comments)    Other reaction(s): Other (comments) WORSENED DEPRESSION. WORSENED DEPRESSION.   .Marland KitchenCodeine Itching and Rash  . Elemental Sulfur Rash    Family History:  Family History  Problem Relation Age of Onset  . Breast cancer Mother   . Colon polyps Mother   . Stroke Mother 54 . GER disease Father   . Diabetes Father   . Kidney disease Father   . CAD Father   . Other Father        cause of death listed as resp. failure  . Colon polyps Sister   . Stroke Sister   . Diabetes Maternal Grandfather   . Heart disease Maternal Grandfather   . Diabetes Paternal Grandmother   . CAD Paternal Grandfather   . Pancreatic cancer Paternal Uncle   . Pancreatic cancer Maternal Uncle   . Colon cancer Neg Hx   . Esophageal cancer Neg Hx   . Stomach cancer Neg Hx   . Rectal cancer Neg Hx      Current Outpatient Medications:  .  aspirin EC 81 MG tablet, Take 81 mg by mouth daily., Disp: , Rfl:  .  Blood Glucose Monitoring Suppl (CONTOUR NEXT  ONE) KIT, by Does not apply route., Disp: , Rfl:  .  Cholecalciferol (VITAMIN D3) 50 MCG (2000 UT) TABS, Take by mouth., Disp: , Rfl:  .  clobetasol cream (TEMOVATE) 9.52 %, Apply 1 application topically at bedtime. Apply thin film to external vulvar area nightly x 2 weeks, every other night x 2 weeks, then 2 nights per week, Disp: 30 g, Rfl: 3 .  dicyclomine (BENTYL) 20 MG tablet, Take 1 tablet (20 mg total) by mouth 4 (four) times daily -  before meals and at bedtime. (Patient taking differently: Take 20 mg by mouth 4 (four) times daily -  before meals and at bedtime. Uses prn), Disp: 120 tablet, Rfl: 3 .  Evening Primrose Oil 1000 MG CAPS, Take by mouth., Disp: , Rfl:  .  hydrochlorothiazide (HYDRODIURIL) 25 MG  tablet, TAKE 1 TABLET BY MOUTH DAILY GENERIC EQUIVALENT FOR HYDRODIURIL, Disp: 90 tablet, Rfl: 1 .  lisinopril (ZESTRIL) 20 MG tablet, Take 1 tablet (20 mg total) by mouth daily., Disp: 90 tablet, Rfl: 1 .  Melatonin 10 MG CAPS, Take by mouth., Disp: , Rfl:  .  metFORMIN (GLUCOPHAGE) 500 MG tablet, Take 2 tablets (1,000 mg total) by mouth 2 (two) times daily., Disp: 360 tablet, Rfl: 1 .  Microlet Lancets MISC, by Does not apply route. Test 3-4 times daily (Micorlet Colored Lancets), Disp: , Rfl:  .  Omega-3 Fatty Acids (FISH OIL) 1000 MG CAPS, Take by mouth., Disp: , Rfl:  .  omeprazole (PRILOSEC) 40 MG capsule, Take 1 capsule (40 mg total) by mouth daily., Disp: 90 capsule, Rfl: 3 .  rosuvastatin (CRESTOR) 10 MG tablet, Take 1 tablet (10 mg total) by mouth daily., Disp: 90 tablet, Rfl: 1 .  TRULICITY 1.5 WU/1.3KG SOPN, INJECT 1.5 MG UNDER THE SKIN ONCE A WEEK, Disp: 6 mL, Rfl: 2 .  venlafaxine XR (EFFEXOR-XR) 37.5 MG 24 hr capsule, Take 1 capsule (37.5 mg total) by mouth daily., Disp: 90 capsule, Rfl: 1 .  levothyroxine (SYNTHROID) 88 MCG tablet, Take 1 tablet (88 mcg total) by mouth daily before breakfast., Disp: 90 tablet, Rfl: 1  Review of Systems:  Constitutional: Denies fever, chills, diaphoresis, appetite change and fatigue.  HEENT: Denies photophobia, eye pain, redness, hearing loss, ear pain, congestion, sore throat, rhinorrhea, sneezing, mouth sores, trouble swallowing, neck pain, neck stiffness and tinnitus.   Respiratory: Denies SOB, DOE, cough, chest tightness,  and wheezing.   Cardiovascular: Denies chest pain, palpitations and leg swelling.  Gastrointestinal: Denies nausea, vomiting, abdominal pain, diarrhea, constipation, blood in stool and abdominal distention.  Genitourinary: Denies dysuria, urgency, frequency, hematuria, flank pain and difficulty urinating.  Endocrine: Denies: hot or cold intolerance, sweats, changes in hair or nails, polyuria, polydipsia. Musculoskeletal:  Denies myalgias, back pain, joint swelling, arthralgias and gait problem.  Skin: Denies pallor, rash and wound.  Neurological: Denies dizziness, seizures, syncope, weakness, light-headedness, numbness and headaches.  Hematological: Denies adenopathy. Easy bruising, personal or family bleeding history  Psychiatric/Behavioral: Denies suicidal ideation, mood changes, confusion, nervousness, sleep disturbance and agitation    Physical Exam: Vitals:   03/10/20 0830  BP: 110/70  Pulse: 81  Temp: 98.4 F (36.9 C)  TempSrc: Oral  SpO2: 98%  Weight: 198 lb 8 oz (90 kg)    Body mass index is 32.53 kg/m.   Constitutional: NAD, calm, comfortable Eyes: PERRL, lids and conjunctivae normal, wears corrective lenses ENMT: Mucous membranes are moist.  Neck: normal, supple, no masses, no thyromegaly Respiratory: clear to auscultation bilaterally, no  wheezing, no crackles. Normal respiratory effort. No accessory muscle use.  Cardiovascular: Regular rate and rhythm, no murmurs / rubs / gallops. No extremity edema.   Neurologic: Grossly intact and nonfocal. Psychiatric: Normal judgment and insight. Alert and oriented x 3. Normal mood.    Impression and Plan:  Type 2 diabetes mellitus without complication, without long-term current use of insulin (HCC)  -A1c today is within range at 6.3.  Primary hypertension -Blood pressure is well controlled today.  Hyperlipidemia, unspecified hyperlipidemia type -Last LDL was at goal at 71 in November 2021, she is on rosuvastatin 10 mg daily.  NASH (nonalcoholic steatohepatitis) Transaminitis -She is followed by GI for this.  Vitamin D deficiency -Recheck vitamin D with next labs.  Hypothyroidism, unspecified type  - Plan: levothyroxine (SYNTHROID) 88 MCG tablet -TSH was normal at 1.140 at last visit in November.  Obesity (BMI 30.0-34.9) -Discussed healthy lifestyle, including increased physical activity and better food choices to promote weight  loss.    Patient Instructions  -Nice seeing you today!!  -Schedule follow up in 3 months.     Lelon Frohlich, MD [facility] at [facility]

## 2020-04-24 ENCOUNTER — Encounter: Payer: Self-pay | Admitting: Internal Medicine

## 2020-04-24 DIAGNOSIS — E039 Hypothyroidism, unspecified: Secondary | ICD-10-CM

## 2020-04-25 ENCOUNTER — Other Ambulatory Visit: Payer: Self-pay | Admitting: Internal Medicine

## 2020-04-25 DIAGNOSIS — E039 Hypothyroidism, unspecified: Secondary | ICD-10-CM

## 2020-04-25 MED ORDER — LEVOTHYROXINE SODIUM 88 MCG PO TABS: 88.0000 ug | ORAL_TABLET | Freq: Every day | ORAL | 0 refills | Status: DC

## 2020-04-25 MED ORDER — LEVOTHYROXINE SODIUM 88 MCG PO TABS: 88.0000 ug | ORAL_TABLET | Freq: Every day | ORAL | 1 refills | Status: DC

## 2020-06-06 ENCOUNTER — Other Ambulatory Visit: Payer: Self-pay | Admitting: Internal Medicine

## 2020-06-07 ENCOUNTER — Other Ambulatory Visit: Payer: Self-pay | Admitting: Internal Medicine

## 2020-06-09 ENCOUNTER — Other Ambulatory Visit: Payer: Self-pay

## 2020-06-10 ENCOUNTER — Ambulatory Visit (INDEPENDENT_AMBULATORY_CARE_PROVIDER_SITE_OTHER): Payer: BC Managed Care – PPO | Admitting: Internal Medicine

## 2020-06-10 ENCOUNTER — Encounter: Payer: Self-pay | Admitting: Internal Medicine

## 2020-06-10 VITALS — BP 120/80 | HR 80 | Temp 98.7°F | Wt 201.1 lb

## 2020-06-10 DIAGNOSIS — I10 Essential (primary) hypertension: Secondary | ICD-10-CM | POA: Diagnosis not present

## 2020-06-10 DIAGNOSIS — E785 Hyperlipidemia, unspecified: Secondary | ICD-10-CM | POA: Diagnosis not present

## 2020-06-10 DIAGNOSIS — K7581 Nonalcoholic steatohepatitis (NASH): Secondary | ICD-10-CM

## 2020-06-10 DIAGNOSIS — E119 Type 2 diabetes mellitus without complications: Secondary | ICD-10-CM

## 2020-06-10 DIAGNOSIS — E039 Hypothyroidism, unspecified: Secondary | ICD-10-CM

## 2020-06-10 DIAGNOSIS — R7401 Elevation of levels of liver transaminase levels: Secondary | ICD-10-CM | POA: Diagnosis not present

## 2020-06-10 LAB — POCT GLYCOSYLATED HEMOGLOBIN (HGB A1C): Hemoglobin A1C: 6.4 % — AB (ref 4.0–5.6)

## 2020-06-10 LAB — TSH: TSH: 2.97 u[IU]/mL (ref 0.35–4.50)

## 2020-06-10 NOTE — Patient Instructions (Signed)
-  Nice seeing you today!!  -Lab work today; will notify you once results are available.  -Remember to schedule counseling sessions.  -Schedule follow up in 3 months.

## 2020-06-10 NOTE — Progress Notes (Signed)
Established Patient Office Visit     This visit occurred during the SARS-CoV-2 public health emergency.  Safety protocols were in place, including screening questions prior to the visit, additional usage of staff PPE, and extensive cleaning of exam room while observing appropriate contact time as indicated for disinfecting solutions.    CC/Reason for Visit: 69-monthfollow-up chronic medical conditions  HPI: Krista Kleinschmidtis a 55y.o. female who is coming in today for the above mentioned reasons. Past Medical History is significant for: Well-controlled hypertension, hyperlipidemia, hypothyroidism, type 2 diabetes on Metformin and Trulicity as well//transaminitis followed by GI.  She feels like she has been struggling recently.  Lots of stress.  She feels increased depressed mood, fatigued, achy joints.  She wonders about her thyroid levels.  About 6 months ago we decreased her levothyroxine dosage in response to an over suppressed TSH.  She feels like she has been eating things that she knows she should not and believes her A1c has crept up.  Her fasting CBG this morning was 145.   Past Medical/Surgical History: Past Medical History:  Diagnosis Date  . Allergy   . Asthma    exercise induced - not used inhaler couple of yrs pt reported 10-03-2018  . Blood transfusion without reported diagnosis   . Colon polyp   . Depression   . [place]   . GERD (gastroesophageal reflux disease)   . HTN (hypertension)   . Hyperlipidemia   . Hypothyroidism   . NASH (nonalcoholic steatohepatitis)   . Obesity (BMI 30.0-34.9)   . Post menopausal problems   . Sleep apnea    no c-pap  . Transaminitis   . Vitamin D deficiency     Past Surgical History:  Procedure Laterality Date  . ADRENALECTOMY Right   . APPENDECTOMY    . BREAST REDUCTION SURGERY    . CHOLECYSTECTOMY    . HYSTERECTOMY ABDOMINAL WITH SALPINGECTOMY    . LIVER BIOPSY  2008, 2013   NASH  .  TONSILLECTOMY    . UPPER GASTROINTESTINAL ENDOSCOPY      Social History:  reports that she quit smoking about 13 years ago. Her smoking use included cigarettes. She has never used smokeless tobacco. She reports current alcohol use. She reports that she does not use drugs.  Allergies: Allergies  Allergen Reactions  . Spironolactone Hives and Swelling  . Paxil [Paroxetine Hcl] Other (See Comments)    Other reaction(s): Other (comments) WORSENED DEPRESSION. WORSENED DEPRESSION.   .Marland KitchenCodeine Itching and Rash  . Elemental Sulfur Rash    Family History:  Family History  Problem Relation Age of Onset  . Breast cancer Mother   . Colon polyps Mother   . Stroke Mother 53 . GER disease Father   . Diabetes Father   . Kidney disease Father   . CAD Father   . Other Father        cause of death listed as resp. failure  . Colon polyps Sister   . Stroke Sister   . Diabetes Maternal Grandfather   . Heart disease Maternal Grandfather   . Diabetes Paternal Grandmother   . CAD Paternal Grandfather   . Pancreatic cancer Paternal Uncle   . Pancreatic cancer Maternal Uncle   . Colon cancer Neg Hx   . Esophageal cancer Neg Hx   . Stomach cancer Neg Hx   . Rectal cancer Neg Hx      Current Outpatient Medications:  .  aspirin EC 81 MG tablet, Take 81 mg by mouth daily., Disp: , Rfl:  .  Blood Glucose Monitoring Suppl (CONTOUR NEXT ONE) KIT, by Does not apply route., Disp: , Rfl:  .  Cholecalciferol (VITAMIN D3) 50 MCG (2000 UT) TABS, Take by mouth., Disp: , Rfl:  .  clobetasol cream (TEMOVATE) 1.49 %, Apply 1 application topically at bedtime. Apply thin film to external vulvar area nightly x 2 weeks, every other night x 2 weeks, then 2 nights per week, Disp: 30 g, Rfl: 3 .  dicyclomine (BENTYL) 20 MG tablet, Take 1 tablet (20 mg total) by mouth 4 (four) times daily -  before meals and at bedtime. (Patient taking differently: Take 20 mg by mouth 4 (four) times daily -  before meals and at  bedtime. Uses prn), Disp: 120 tablet, Rfl: 3 .  Evening Primrose Oil 1000 MG CAPS, Take by mouth., Disp: , Rfl:  .  hydrochlorothiazide (HYDRODIURIL) 25 MG tablet, TAKE 1 TABLET BY MOUTH DAILY GENERIC EQUIVALENT FOR HYDRODIURIL, Disp: 90 tablet, Rfl: 1 .  levothyroxine (SYNTHROID) 88 MCG tablet, TAKE 1 TABLET(88 MCG) BY MOUTH DAILY BEFORE AND BREAKFAST, Disp: 90 tablet, Rfl: 0 .  lisinopril (ZESTRIL) 20 MG tablet, TAKE 1 TABLET(20 MG TOTAL) BY MOUTH DAILY, Disp: 90 tablet, Rfl: 1 .  Melatonin 10 MG CAPS, Take by mouth., Disp: , Rfl:  .  metFORMIN (GLUCOPHAGE) 500 MG tablet, Take 2 tablets (1,000 mg total) by mouth 2 (two) times daily., Disp: 360 tablet, Rfl: 1 .  Microlet Lancets MISC, by Does not apply route. Test 3-4 times daily (Micorlet Colored Lancets), Disp: , Rfl:  .  Omega-3 Fatty Acids (FISH OIL) 1000 MG CAPS, Take by mouth., Disp: , Rfl:  .  omeprazole (PRILOSEC) 40 MG capsule, Take 1 capsule (40 mg total) by mouth daily., Disp: 90 capsule, Rfl: 3 .  rosuvastatin (CRESTOR) 10 MG tablet, TAKE 1 TABLET(10 MG TOTAL) BY MOUTH DAILY., Disp: 90 tablet, Rfl: 1 .  TRULICITY 1.5 FW/2.6VZ SOPN, INJECT 1.5 MG UNDER THE SKIN ONCE A WEEK, Disp: 6 mL, Rfl: 2 .  venlafaxine XR (EFFEXOR-XR) 37.5 MG 24 hr capsule, TAKE 1 CAPSULE BY MOUTH DAILY, Disp: 90 capsule, Rfl: 0  Review of Systems:  Constitutional: Denies fever, chills, diaphoresis, appetite change. HEENT: Denies photophobia, eye pain, redness, hearing loss, ear pain, congestion, sore throat, rhinorrhea, sneezing, mouth sores, trouble swallowing, neck pain, neck stiffness and tinnitus.   Respiratory: Denies SOB, DOE, cough, chest tightness,  and wheezing.   Cardiovascular: Denies chest pain, palpitations and leg swelling.  Gastrointestinal: Denies nausea, vomiting, abdominal pain, diarrhea, constipation, blood in stool and abdominal distention.  Genitourinary: Denies dysuria, urgency, frequency, hematuria, flank pain and difficulty urinating.   Endocrine: Denies: hot or cold intolerance, sweats, changes in hair or nails, polyuria, polydipsia. Musculoskeletal: Denies myalgias, back pain, joint swelling, arthralgias and gait problem.  Skin: Denies pallor, rash and wound.  Neurological: Denies dizziness, seizures, syncope, weakness, light-headedness, numbness and headaches.  Hematological: Denies adenopathy. Easy bruising, personal or family bleeding history  Psychiatric/Behavioral: Denies suicidal ideation,  confusion, nervousness, sleep disturbance and agitation    Physical Exam: Vitals:   06/10/20 0839  BP: 120/80  Pulse: 80  Temp: 98.7 F (37.1 C)  TempSrc: Oral  SpO2: 97%  Weight: 201 lb 1.6 oz (91.2 kg)    Body mass index is 32.96 kg/m.   Constitutional: NAD, calm, comfortable Eyes: PERRL, lids and conjunctivae normal, wears corrective lenses ENMT: Mucous membranes are moist.  Respiratory: clear to auscultation bilaterally, no wheezing, no crackles. Normal respiratory effort. No accessory muscle use.  Cardiovascular: Regular rate and rhythm, no murmurs / rubs / gallops. No extremity edema.  Neurologic: Grossly intact and nonfocal Psychiatric: Normal judgment and insight. Alert and oriented x 3. Normal mood.    Impression and Plan:  Type 2 diabetes mellitus without complication, without long-term current use of insulin (HCC)  -A1c remains at goal at 6.4 today. -We have discussed lifestyle changes again today.  Primary hypertension -Well-controlled.  Hyperlipidemia, unspecified hyperlipidemia type -Last LDL was 71 with triglycerides of 140 and total cholesterol of 148 in November 2021. -She is on rosuvastatin 10 mg daily.  Transaminitis NASH (nonalcoholic steatohepatitis) -Stable, followed by GI  Hypothyroidism, unspecified type  - Plan: TSH   Patient Instructions  -Nice seeing you today!!  -Lab work today; will notify you once results are available.  -Remember to schedule counseling  sessions.  -Schedule follow up in 3 months.     Lelon Frohlich, MD [facility] at [facility]

## 2020-06-28 ENCOUNTER — Other Ambulatory Visit: Payer: Self-pay

## 2020-06-29 ENCOUNTER — Encounter: Payer: Self-pay | Admitting: Internal Medicine

## 2020-06-29 ENCOUNTER — Ambulatory Visit (INDEPENDENT_AMBULATORY_CARE_PROVIDER_SITE_OTHER): Payer: BC Managed Care – PPO | Admitting: Internal Medicine

## 2020-06-29 VITALS — BP 110/70 | HR 97 | Temp 98.2°F | Wt 192.7 lb

## 2020-06-29 DIAGNOSIS — R140 Abdominal distension (gaseous): Secondary | ICD-10-CM

## 2020-06-29 DIAGNOSIS — R1013 Epigastric pain: Secondary | ICD-10-CM | POA: Diagnosis not present

## 2020-06-29 DIAGNOSIS — R142 Eructation: Secondary | ICD-10-CM

## 2020-06-29 DIAGNOSIS — E119 Type 2 diabetes mellitus without complications: Secondary | ICD-10-CM

## 2020-06-29 MED ORDER — OMEPRAZOLE 40 MG PO CPDR: 40.0000 mg | DELAYED_RELEASE_CAPSULE | Freq: Two times a day (BID) | ORAL | 3 refills | Status: DC

## 2020-06-29 NOTE — Patient Instructions (Signed)
-  Nice seeing you today!!  -Increase omeprazole to 40 mg twice daily.  -Clear liquid diet and go up as tolerated.  -Let me know if not better in around 2 weeks.

## 2020-06-29 NOTE — Progress Notes (Signed)
Acute office Visit     This visit occurred during the SARS-CoV-2 public health emergency.  Safety protocols were in place, including screening questions prior to the visit, additional usage of staff PPE, and extensive cleaning of exam room while observing appropriate contact time as indicated for disinfecting solutions.    CC/Reason for Visit: Abdominal bloating  HPI: Krista Dawson is a 55 y.o. female who is coming in today for the above mentioned reasons. Past Medical History is significant for: Well-controlled hypertension, hyperlipidemia, hypothyroidism, type 2 diabetes on Metformin and Trulicity as well//transaminitis followed by GI.  Past 10 days she has been experiencing postprandial bloating, burping with decreased appetite and fatigue.  She has a documented 9 pound weight loss as she weighs 192 pounds today and she weighed 201 pounds in office on June 3.  She has not had any fever.  She has noticed some watery diarrhea over the past few days.  She did do a home COVID test 2 days ago that was negative.  When she saw Dr. Tarri Glenn last year for her upper endoscopy there was significant food particles noticed in the stomach, she was then referred for a gastric emptying scan which was done in March 2021 and interpreted as normal.   Past Medical/Surgical History: Past Medical History:  Diagnosis Date   Allergy    Asthma    exercise induced - not used inhaler couple of yrs pt reported 10-03-2018   Blood transfusion without reported diagnosis    Colon polyp    Depression    DM (diabetes mellitus) ([place])    GERD (gastroesophageal reflux disease)    HTN (hypertension)    Hyperlipidemia    Hypothyroidism    NASH (nonalcoholic steatohepatitis)    Obesity (BMI 30.0-34.9)    Post menopausal problems    Sleep apnea    no c-pap   Transaminitis    Vitamin D deficiency     Past Surgical History:  Procedure Laterality Date   ADRENALECTOMY Right    APPENDECTOMY     BREAST  REDUCTION SURGERY     CHOLECYSTECTOMY     HYSTERECTOMY ABDOMINAL WITH SALPINGECTOMY     LIVER BIOPSY  2008, 2013   NASH   TONSILLECTOMY     UPPER GASTROINTESTINAL ENDOSCOPY      Social History:  reports that she quit smoking about 13 years ago. Her smoking use included cigarettes. She has never used smokeless tobacco. She reports current alcohol use. She reports that she does not use drugs.  Allergies: Allergies  Allergen Reactions   Spironolactone Hives and Swelling   Paxil [Paroxetine Hcl] Other (See Comments)    Other reaction(s): Other (comments) WORSENED DEPRESSION. WORSENED DEPRESSION.    Codeine Itching and Rash   Elemental Sulfur Rash    Family History:  Family History  Problem Relation Age of Onset   Breast cancer Mother    Colon polyps Mother    Stroke Mother 30   GER disease Father    Diabetes Father    Kidney disease Father    CAD Father    Other Father        cause of death listed as resp. failure   Colon polyps Sister    Stroke Sister    Diabetes Maternal Grandfather    Heart disease Maternal Grandfather    Diabetes Paternal Grandmother    CAD Paternal Grandfather    Pancreatic cancer Paternal Uncle    Pancreatic cancer Maternal Uncle    Colon cancer  Neg Hx    Esophageal cancer Neg Hx    Stomach cancer Neg Hx    Rectal cancer Neg Hx      Current Outpatient Medications:    aspirin EC 81 MG tablet, Take 81 mg by mouth daily., Disp: , Rfl:    Blood Glucose Monitoring Suppl (CONTOUR NEXT ONE) KIT, by Does not apply route., Disp: , Rfl:    Cholecalciferol (VITAMIN D3) 50 MCG (2000 UT) TABS, Take by mouth., Disp: , Rfl:    clobetasol cream (TEMOVATE) 6.22 %, Apply 1 application topically at bedtime. Apply thin film to external vulvar area nightly x 2 weeks, every other night x 2 weeks, then 2 nights per week, Disp: 30 g, Rfl: 3   dicyclomine (BENTYL) 20 MG tablet, Take 1 tablet (20 mg total) by mouth 4 (four) times daily -  before meals and at bedtime.  (Patient taking differently: Take 20 mg by mouth 4 (four) times daily -  before meals and at bedtime. Uses prn), Disp: 120 tablet, Rfl: 3   Evening Primrose Oil 1000 MG CAPS, Take by mouth., Disp: , Rfl:    hydrochlorothiazide (HYDRODIURIL) 25 MG tablet, TAKE 1 TABLET BY MOUTH DAILY GENERIC EQUIVALENT FOR HYDRODIURIL, Disp: 90 tablet, Rfl: 1   levothyroxine (SYNTHROID) 88 MCG tablet, TAKE 1 TABLET(88 MCG) BY MOUTH DAILY BEFORE AND BREAKFAST, Disp: 90 tablet, Rfl: 0   lisinopril (ZESTRIL) 20 MG tablet, TAKE 1 TABLET(20 MG TOTAL) BY MOUTH DAILY, Disp: 90 tablet, Rfl: 1   Melatonin 10 MG CAPS, Take by mouth., Disp: , Rfl:    metFORMIN (GLUCOPHAGE) 500 MG tablet, Take 2 tablets (1,000 mg total) by mouth 2 (two) times daily., Disp: 360 tablet, Rfl: 1   Microlet Lancets MISC, by Does not apply route. Test 3-4 times daily (Micorlet Colored Lancets), Disp: , Rfl:    Omega-3 Fatty Acids (FISH OIL) 1000 MG CAPS, Take by mouth., Disp: , Rfl:    rosuvastatin (CRESTOR) 10 MG tablet, TAKE 1 TABLET(10 MG TOTAL) BY MOUTH DAILY., Disp: 90 tablet, Rfl: 1   TRULICITY 1.5 QJ/3.3LK SOPN, INJECT 1.5 MG UNDER THE SKIN ONCE A WEEK, Disp: 6 mL, Rfl: 2   venlafaxine XR (EFFEXOR-XR) 37.5 MG 24 hr capsule, TAKE 1 CAPSULE BY MOUTH DAILY, Disp: 90 capsule, Rfl: 0   omeprazole (PRILOSEC) 40 MG capsule, Take 1 capsule (40 mg total) by mouth in the morning and at bedtime., Disp: 90 capsule, Rfl: 3  Review of Systems:  Constitutional: Denies fever, chills, diaphoresis. HEENT: Denies photophobia, eye pain, redness, hearing loss, ear pain, congestion, sore throat, rhinorrhea, sneezing, mouth sores, trouble swallowing, neck pain, neck stiffness and tinnitus.   Respiratory: Denies SOB, DOE, cough, chest tightness,  and wheezing.   Cardiovascular: Denies chest pain, palpitations and leg swelling.  Gastrointestinal: Denies vomiting, constipation, blood in stool. Genitourinary: Denies dysuria, urgency, frequency, hematuria, flank pain  and difficulty urinating.  Endocrine: Denies: hot or cold intolerance, sweats, changes in hair or nails, polyuria, polydipsia. Musculoskeletal: Denies myalgias, back pain, joint swelling, arthralgias and gait problem.  Skin: Denies pallor, rash and wound.  Neurological: Denies dizziness, seizures, syncope, weakness, light-headedness, numbness and headaches.  Hematological: Denies adenopathy. Easy bruising, personal or family bleeding history  Psychiatric/Behavioral: Denies suicidal ideation, mood changes, confusion, nervousness, sleep disturbance and agitation    Physical Exam: Vitals:   06/29/20 0833  BP: 110/70  Pulse: 97  Temp: 98.2 F (36.8 C)  TempSrc: Oral  SpO2: 97%  Weight: 192 lb 11.2 oz (87.4 kg)  Body mass index is 31.58 kg/m.   Constitutional: NAD, calm, comfortable Eyes: PERRL, lids and conjunctivae normal ENMT: Mucous membranes are moist.  Abdomen: no tenderness, no masses palpated. No hepatosplenomegaly. Bowel sounds positive.  Neurologic: Grossly intact and nonfocal Psychiatric: Normal judgment and insight. Alert and oriented x 3. Normal mood.    Impression and Plan:  Abdominal bloating Burping Type 2 diabetes mellitus without complication, without long-term current use of insulin (HCC) Dyspepsia   -I think most likely diagnoses at this time remains undertreated dyspepsia/GERD, diabetic gastroparesis or acute  gastroenteritis. -Diabetic gastroparesis is less likely given the normal gastric emptying scan last year although certainly not impossible. -Duration of symptoms also makes acute gastroenteritis less likely. -She has no true abdominal pain to make me think of diverticulitis, pancreatitis.  She is status post cholecystectomy. -For now we have elected to double up on her PPI therapy, she will start clear liquid diet and augment as tolerated. -She already has an appointment scheduled with GI for August that she will keep. -Can consider redoing  gastric emptying scan if no improvement in symptoms over the next 4 to 6 weeks.  Time spent: 31 minutes reviewing chart, interviewing and examining patient and formulating plan of care.   Patient Instructions  -Nice seeing you today!!  -Increase omeprazole to 40 mg twice daily.  -Clear liquid diet and go up as tolerated.  -Let me know if not better in around 2 weeks.    Lelon Frohlich, MD [facility] at [facility]

## 2020-07-15 DIAGNOSIS — Z803 Family history of malignant neoplasm of breast: Secondary | ICD-10-CM | POA: Insufficient documentation

## 2020-07-19 ENCOUNTER — Ambulatory Visit (INDEPENDENT_AMBULATORY_CARE_PROVIDER_SITE_OTHER): Payer: BC Managed Care – PPO | Admitting: Nurse Practitioner

## 2020-07-19 ENCOUNTER — Encounter: Payer: Self-pay | Admitting: Nurse Practitioner

## 2020-07-19 ENCOUNTER — Other Ambulatory Visit (INDEPENDENT_AMBULATORY_CARE_PROVIDER_SITE_OTHER): Payer: BC Managed Care – PPO

## 2020-07-19 VITALS — BP 114/74 | HR 92 | Ht 65.5 in | Wt 196.2 lb

## 2020-07-19 DIAGNOSIS — K760 Fatty (change of) liver, not elsewhere classified: Secondary | ICD-10-CM

## 2020-07-19 DIAGNOSIS — R140 Abdominal distension (gaseous): Secondary | ICD-10-CM | POA: Diagnosis not present

## 2020-07-19 DIAGNOSIS — R1013 Epigastric pain: Secondary | ICD-10-CM

## 2020-07-19 DIAGNOSIS — R112 Nausea with vomiting, unspecified: Secondary | ICD-10-CM

## 2020-07-19 DIAGNOSIS — R7989 Other specified abnormal findings of blood chemistry: Secondary | ICD-10-CM

## 2020-07-19 LAB — COMPREHENSIVE METABOLIC PANEL
ALT: 30 U/L (ref 0–35)
AST: 20 U/L (ref 0–37)
Albumin: 4.8 g/dL (ref 3.5–5.2)
Alkaline Phosphatase: 71 U/L (ref 39–117)
BUN: 24 mg/dL — ABNORMAL HIGH (ref 6–23)
CO2: 22 mEq/L (ref 19–32)
Calcium: 10.5 mg/dL (ref 8.4–10.5)
Chloride: 105 mEq/L (ref 96–112)
Creatinine, Ser: 0.9 mg/dL (ref 0.40–1.20)
GFR: 73.18 mL/min (ref >60.00)
Glucose, Bld: 98 mg/dL (ref 70–99)
Potassium: 4.7 mEq/L (ref 3.5–5.1)
Sodium: 135 mEq/L (ref 135–145)
Total Bilirubin: 0.4 mg/dL (ref 0.2–1.2)
Total Protein: 7.4 g/dL (ref 6.0–8.3)

## 2020-07-19 LAB — CBC WITH DIFFERENTIAL/PLATELET
Basophils Absolute: 0 10*3/uL (ref 0.0–0.1)
Basophils Relative: 0.5 % (ref 0.0–3.0)
Eosinophils Absolute: 0.3 10*3/uL (ref 0.0–0.7)
Eosinophils Relative: 3.8 % (ref 0.0–5.0)
HCT: 41.5 % (ref 36.0–46.0)
Hemoglobin: 13.7 g/dL (ref 12.0–15.0)
Lymphocytes Relative: 43 % (ref 12.0–46.0)
Lymphs Abs: 3.6 10*3/uL (ref 0.7–4.0)
MCHC: 33 g/dL (ref 30.0–36.0)
MCV: 83.7 fl (ref 78.0–100.0)
Monocytes Absolute: 0.4 10*3/uL (ref 0.1–1.0)
Monocytes Relative: 5.2 % (ref 3.0–12.0)
Neutro Abs: 3.9 10*3/uL (ref 1.4–7.7)
Neutrophils Relative %: 47.5 % (ref 43.0–77.0)
Platelets: 325 10*3/uL (ref 150.0–400.0)
RBC: 4.95 Mil/uL (ref 3.87–5.11)
RDW: 14.4 % (ref 11.5–15.5)
WBC: 8.3 10*3/uL (ref 4.0–10.5)

## 2020-07-19 LAB — LIPASE: Lipase: 101 U/L — ABNORMAL HIGH (ref 11.0–59.0)

## 2020-07-19 MED ORDER — PANTOPRAZOLE SODIUM 40 MG PO TBEC: 40.0000 mg | DELAYED_RELEASE_TABLET | Freq: Every day | ORAL | 0 refills | Status: DC

## 2020-07-19 MED ORDER — RIFAXIMIN 550 MG PO TABS: 550.0000 mg | ORAL_TABLET | Freq: Three times a day (TID) | ORAL | 0 refills | Status: AC

## 2020-07-19 NOTE — Patient Instructions (Addendum)
If you are age 53 or younger, your body mass index should be between 19-25. Your Body mass index is 32.16 kg/m. If this is out of the aformentioned range listed, please consider follow up with your Primary Care Provider.   The [facility] providers would like to encourage you to use [facility] to communicate with providers for non-urgent requests or questions.  Due to long hold times on the telephone, sending your provider a message by [facility] may be faster and more efficient way to get a response. Please allow 48 business hours for a response.  Please remember that this is for non-urgent requests/questions.  IMAGING: You will be contacted by [facility] Scheduling (Your caller ID will indicate phone # [phone]) in the next 2 days to schedule your Abdominal pelvic CT scan. If you have not heard from them within 2 business days, please call [facility] at [phone] to follow up on the status of your appointment.    LABS:  Lab work has been ordered for you today. Our lab is located in the basement. Press "B" on the elevator. The lab is located at the first door on the left as you exit the elevator.  HEALTHCARE LAWS AND MY CHART RESULTS: Due to recent changes in healthcare laws, you may see the results of your imaging and laboratory studies on MyChart before your provider has had a chance to review them.   We understand that in some cases there may be results that are confusing or concerning to you. Not all laboratory results come back in the same time frame and the provider may be waiting for multiple results in order to interpret others.  Please give [place] 48 hours in order for your provider to thoroughly review all the results before contacting the office for clarification of your results.   We have scheduled you a follow up with Dr. Tarri Glenn on 10/04/20 at 3:40pm.  RECOMMENDATIONS: Stop Omeprazole. Start Pantoprazole 40 mg once a day. We sent this to your  pharmacy. May take Pepcid 20 MG at night if needed. This is over the counter. Xifaxan has been sent to [facility].  It was great seeing you today! Thank you for entrusting me with your care and choosing [facility].  Noralyn Pick, CRNP

## 2020-07-19 NOTE — Progress Notes (Signed)
07/19/2020 Krista Dawson [id number] 03/09/1965   Chief Complaint: sour stomach, abdominal bloat  History of Present Illness: Krista Dawson is a 55 year old female with a past medical history of metabolic syndrome X with obesity, diabetes mellitus type II, hypertension, dyslipidemia.  Nonalcoholic fatty liver disease and a hyperplastic colon polyp. Past Cholecystectomy, total laparoscopic hysterectomy bilateral salpingoopherectomy 11/2016 and appendectomy 2002. She was last seen in office by Dr. Tarri Glenn on 09/01/2019 for follow up regarding nausea, early satiety, epigastric fullness and abdominal bloat. SIBO was suspected and her symptoms resolved after she took Xifaxan 559m po tid x 14 days. She presents today with recurrence of similar symptoms. She awakens with a sour stomach, feels nauseated without vomiting. Every time she eats she feels bloated and burps a lot since June. Feels like food sits in her stomach. No dysphagia or heartburn. Sometimes has upper abdominal discomfort. No severe abdominal pain. She saw her PCP a few weeks ago who increased he Omeprazole to 460mpo bid without improvement. She reported losing 10+ lbs over the past 2 months then gained back a few pounds. No fevers or night sweats. She is tolerating a bland diet, mashed potatoes, rice, apple sauce and various proteins. She is passing a normal brown formed stool most days. No rectal bleeding or black stools. An abdominal sonogram 02/23/2019 showed evidence of hepatic steatosis, past cholecystectomy. She underwent a gastric empty study 03/2017 which was normal. EGD 03/13/2019 showed mild gastritis and an esophageal squamous papilloma. A colonoscopy was done 10/03/2018 which showed a hyperplastic polyp. Mother and sister with history of colon polyps, mother also had breast cancer. Maternal and paternal uncle with history of pancreatic cancer.   Past Medical History:  Diagnosis Date   Allergy    Asthma    exercise  induced - not used inhaler couple of yrs pt reported 10-03-2018   Blood transfusion without reported diagnosis    Colon polyp    Depression    DM (diabetes mellitus) ([place]   GERD (gastroesophageal reflux disease)    HTN (hypertension)    Hyperlipidemia    Hypothyroidism    NASH (nonalcoholic steatohepatitis)    Obesity (BMI 30.0-34.9)    Post menopausal problems    Sleep apnea    no c-pap   Transaminitis    Vitamin D deficiency    Past Surgical History:  Procedure Laterality Date   ADRENALECTOMY Right    APPENDECTOMY     BREAST REDUCTION SURGERY     CHOLECYSTECTOMY     HYSTERECTOMY ABDOMINAL WITH SALPINGECTOMY     LIVER BIOPSY  2008, 2013   NASH   TONSILLECTOMY     UPPER GASTROINTESTINAL ENDOSCOPY     Gastric Empty Study: 03/27/2019: Normal gastric empty study  EGD 03/13/2019: - Mucosal nodule found in the esophagus. Biopsied. - Normal esophagus. Biopsied. - A medium amount of food (residue - Normal stomach. Biopsied. - Retained food in the duodenum. 1. Surgical [P], duodenum - DUODENAL MUCOSA WITH NO SIGNIFICANT PATHOLOGIC FINDINGS. - NEGATIVE FOR INCREASED INTRAEPITHELIAL LYMPHOCYTES AND VILLOUS ARCHITECTURAL CHANGES. 2. Surgical [P], random sites gastric - GASTRIC ANTRAL MUCOSA WITH MILD REACTIVE GASTROPATHY. - GASTRIC OXYNTIC MUCOSA WITH MILD CHRONIC GASTRITIS. - WARTHIN-STARRY STAIN IS NEGATIVE FOR HELICOBACTER PYLORI. 3. Surgical [P], distal esophagus - GASTROESOPHAGEAL JUNCTION MUCOSA WITH REACTIVE/REGENERATIVE CHANGES. - NEGATIVE FOR INTESTINAL METAPLASIA (GOBLET CELL METAPLASIA). 4. Surgical [P], proximal esophagus - SQUAMOUS ESOPHAGEAL EPITHELIUM WITH NO SIGNIFICANT PATHOLOGIC FINDINGS. - NEGATIVE FOR INCREASED INTRAEPITHELIAL EOSINOPHILS. 5. Surgical [P], esophageal nodule -  SQUAMOUS PAPILLOMA. - NO SUBMUCOSA PRESENT FOR EVALUATION.  Colonoscopy 10/03/2018: - One 2 mm hyperplastic polyp in the ascending colon, removed with a cold snare. Resected and  retrieved. - The examination was otherwise normal on direct and retroflexion views. 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 NASH fibrosure 02/18/2019: F0 fibrosis, S3 steatosis, N2 NASH  Allergies  Allergen Reactions   Spironolactone Hives and Swelling   Paxil [Paroxetine Hcl] Other (See Comments)    Other reaction(s): Other (comments) WORSENED DEPRESSION. WORSENED DEPRESSION.    Codeine Itching and Rash   Elemental Sulfur Rash    Current Medications, Allergies, Past Medical History, Past Surgical History, Family History and Social History were reviewed in [employer] record.  Review of Systems:   Constitutional: Negative for fever, sweats, chills or weight loss.  Respiratory: Negative for shortness of breath.   Cardiovascular: Negative for chest pain, palpitations and leg swelling.  Gastrointestinal: See HPI.  Musculoskeletal: Negative for back pain or muscle aches.  Neurological: Negative for dizziness, headaches or paresthesias.   Physical Exam: Ht 5' 5.5" (1.664 m)   Wt 196 lb 4 oz (89 kg)   BMI 32.16 kg/m   Wt Readings from Last 3 Encounters:  07/19/20 196 lb 4 oz (89 kg)  06/29/20 192 lb 11.2 oz (87.4 kg)  06/10/20 201 lb 1.6 oz (91.2 kg)    General: 55 year old female in NAD.  Head: Normocephalic and atraumatic. Eyes: No scleral icterus. Conjunctiva pink . Ears: Normal auditory acuity. Mouth: Dentition intact. No ulcers or lesions.  Neck: No lymphadenopathy or thyromegaly. Prominent soft tissue right supraclavicular area without discrete mass/lymph node (patient stated this has been present for many years, also noted by her PCP in the past thought to be adipose tissue). Lungs: Clear throughout to auscultation. Heart: Regular rate and  rhythm, no murmur. Abdomen: Soft, nondistended. Epigastric tenderness. No masses or hepatomegaly. Normal bowel sounds x 4 quadrants.  Rectal: Deferred.  Musculoskeletal: Symmetrical with no gross deformities. Extremities: No edema. Neurological: Alert oriented x 4. No focal deficits.  Psychological: Alert and cooperative. Normal mood and affect  Assessment and Recommendations:  90. 55 year old female with recurrent nausea, sour taste, abdominal  bloat and upper abdominal fullness. Previously treated with Xifaxan for suspected SIBO and her symptoms resolved. 10+ weight loss. Normals gastric empty study and EGD showed mild chronic gastritis 03/2019. -Repeat trial with Xifaxan 563m one po tid x 14 days -Stop Omeprazole -Pantoprazole 44mone po QD for gastritis. Pepcid otc QD as needed.  -CTAP with oral contrast only (no IV contrast secondary to worldwide IV contrast shortage). If CTAP unrevealing and if her nausea, upper abdominal fullness/bloat persists to consider abdominal MRI w/wo contrast with focus on the pancreas (maternal and paternal uncles with history of pancreatic cancer) -CBC, CMP and Lipase level   2. Nonalcoholic fatty liver disease. AST 20/ALT 27 on 11/27/2019 -Further recommendations to be determined after the above lab and CT results reviewed  3. History of a hyperplastic polyp per colonoscopy 09/2018. Family history of colon polyps -Next colonoscopy due 09/2023  4. DM II. Hg A1c 6.3 on 3/3/202. On Metformin and Trulicity.   5. Soft tissue/adipose prominence to the right supraclavicular area (patient reports is chronic) -Follow up with PCP, consider neck/chest CT

## 2020-07-20 ENCOUNTER — Other Ambulatory Visit: Payer: Self-pay

## 2020-07-20 DIAGNOSIS — R634 Abnormal weight loss: Secondary | ICD-10-CM

## 2020-07-20 DIAGNOSIS — R1010 Upper abdominal pain, unspecified: Secondary | ICD-10-CM

## 2020-07-20 DIAGNOSIS — R748 Abnormal levels of other serum enzymes: Secondary | ICD-10-CM

## 2020-07-21 NOTE — Progress Notes (Signed)
Reviewed and agree with management plans. ? ?Josiephine Simao L. Lenetta Piche, MD, MPH  ?

## 2020-07-28 ENCOUNTER — Ambulatory Visit (HOSPITAL_COMMUNITY): Payer: BC Managed Care – PPO

## 2020-08-02 ENCOUNTER — Other Ambulatory Visit: Payer: Self-pay

## 2020-08-02 ENCOUNTER — Ambulatory Visit (HOSPITAL_COMMUNITY)
Admission: RE | Admit: 2020-08-02 | Discharge: 2020-08-02 | Disposition: A | Discharge status: Home / Self Care | Payer: BC Managed Care – PPO | Source: Ambulatory Visit | Attending: Nurse Practitioner | Admitting: Nurse Practitioner

## 2020-08-02 ENCOUNTER — Other Ambulatory Visit: Payer: Self-pay | Admitting: Nurse Practitioner

## 2020-08-02 DIAGNOSIS — R634 Abnormal weight loss: Secondary | ICD-10-CM | POA: Insufficient documentation

## 2020-08-02 DIAGNOSIS — R748 Abnormal levels of other serum enzymes: Secondary | ICD-10-CM

## 2020-08-02 DIAGNOSIS — R1010 Upper abdominal pain, unspecified: Secondary | ICD-10-CM

## 2020-08-02 IMAGING — MR MR 3D RECON AT [facility]ANNER
17 of 18 series · 46 of 48 positions shown · IV contrast (gadavist)
Comparison: None.

CLINICAL DATA: Weight loss.  Elevated lipase.  Abdominal pain.

EXAM:
MRI ABDOMEN WITHOUT AND WITH CONTRAST
TECHNIQUE: Multiplanar multisequence MR imaging of the abdomen was performed
both before and after the administration of intravenous contrast.
CONTRAST:  9mL GADAVIST GADOBUTROL 1 MMOL/ML IV SOLN

[Series 2: DWI · axial · 6.0mm · 1.49mm/px · z∈[-145,+136]mm · 2 of 80 slices shown (1 of 2)]
[im 1/80]
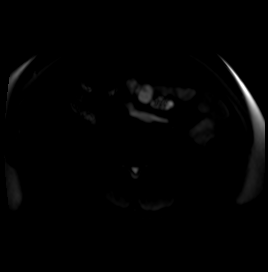
[im 80/80]
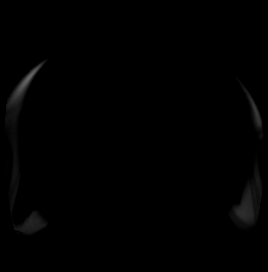

[Series 3: DWI · axial · 6.0mm · 1.49mm/px · 1 of 40 slices shown (2 of 2)]
[im 1/40]
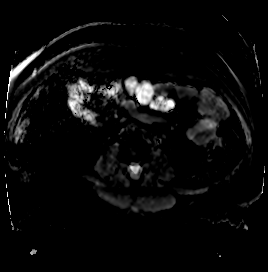

[Series 4: T2 fat-sat · axial · 6.0mm · 1.25mm/px · 1 of 39 slices shown]
[im 1/39]
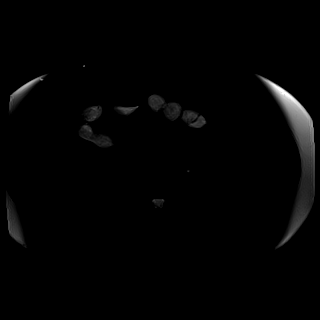

[Series 7: T2 · coronal · 7.0mm · 1.56mm/px · 1 of 36 slices shown (1 of 2)]
[im 1/36]
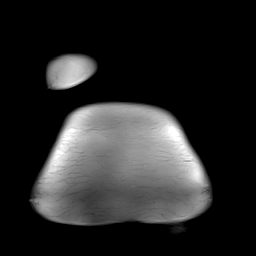

[Series 8: T1 · axial · 3.1mm · 1.25mm/px · z∈[-146,+124]mm · 2 of 88 slices shown (1 of 2)]
[im 1/88]
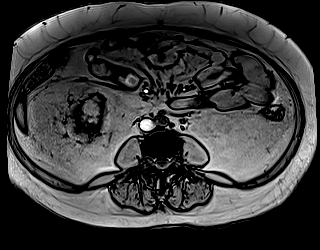
[im 88/88]
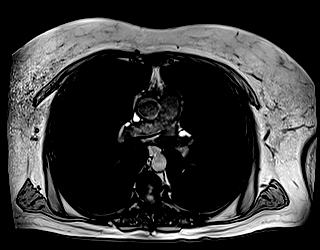

[Series 9: T1 · axial · 3.1mm · 1.25mm/px · z∈[-146,+124]mm · 3 of 88 slices shown (2 of 2)]
[im 1/88]
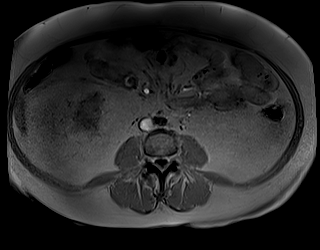
[im 44/88]
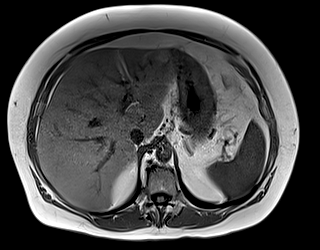
[im 88/88]
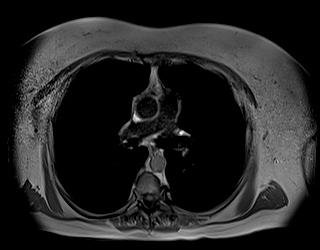

[Series 10: bSSFP · axial · 7.0mm · 1.25mm/px · 1 of 33 slices shown]
[im 1/33]
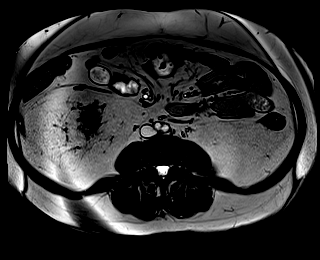

[Series 13: T1 dynamic · axial · 3.0mm · 1.25mm/px · z∈[-167,+142]mm · 4 of 104 slices shown (1 of 9)]
[im 1/104]
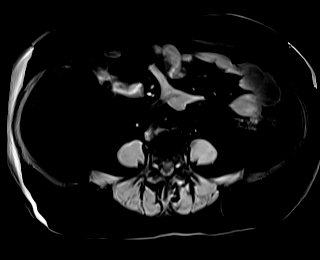
[im 35/104]
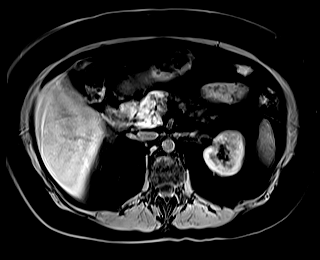
[im 69/104]
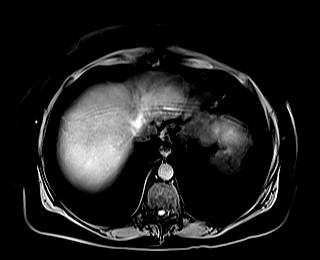
[im 104/104]
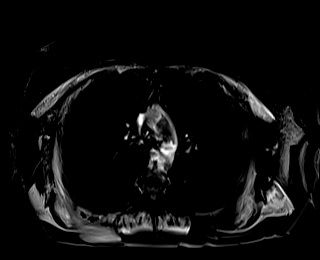

[Series 17: T1 dynamic · axial · 3.0mm · 1.25mm/px · z∈[-167,+142]mm · 4 of 104 slices shown (2 of 9)]
[im 1/104]
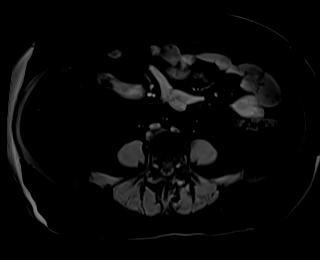
[im 35/104]
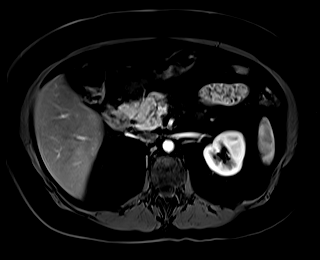
[im 69/104]
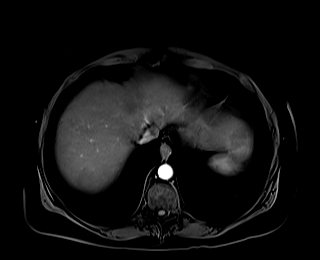
[im 104/104]
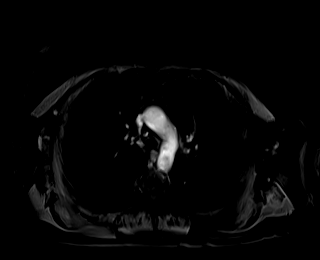

[Series 18: T1 dynamic · axial · 3.0mm · 1.25mm/px · z∈[-167,+142]mm · 4 of 104 slices shown (3 of 9)]
[im 1/104]
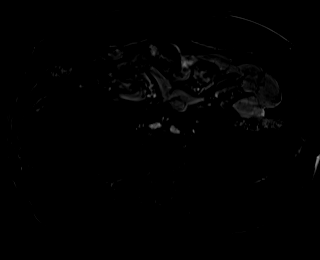
[im 35/104]
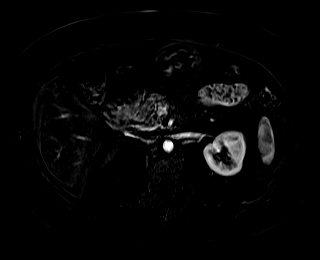
[im 69/104]
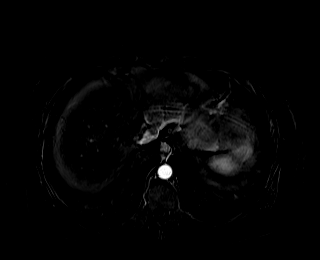
[im 104/104]
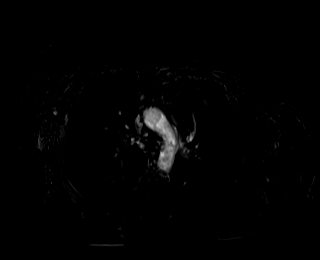

[Series 22: T1 dynamic · axial · 3.0mm · 1.25mm/px · z∈[-167,+142]mm · 4 of 104 slices shown (4 of 9)]
[im 1/104]
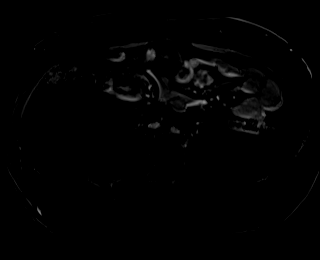
[im 35/104]
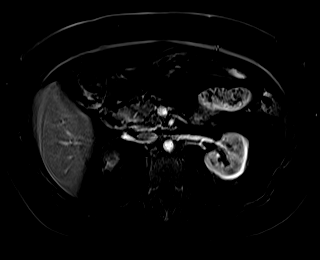
[im 69/104]
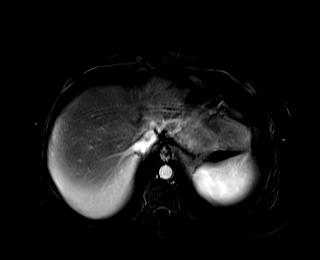
[im 104/104]
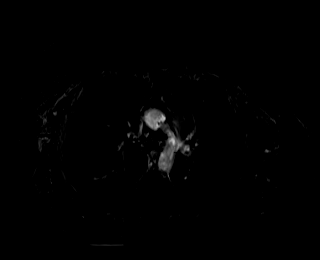

[Series 25: T1 dynamic · axial · 3.0mm · 1.25mm/px · z∈[-167,+142]mm · 4 of 104 slices shown (5 of 9)]
[im 1/104]
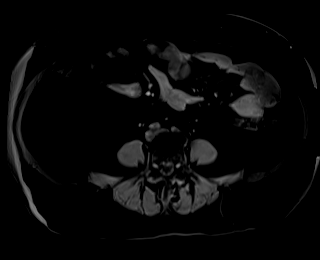
[im 35/104]
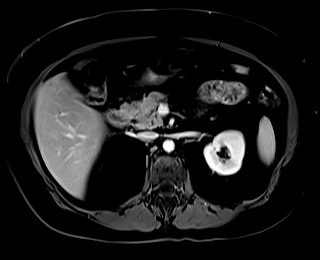
[im 69/104]
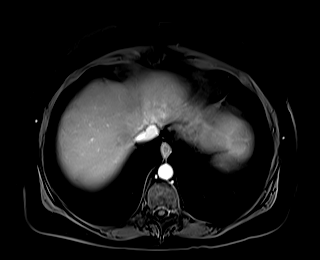
[im 104/104]
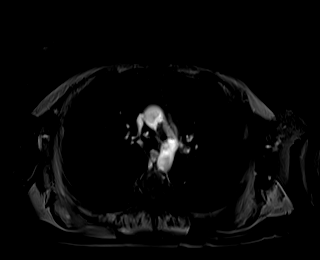

[Series 26: T1 dynamic · axial · 3.0mm · 1.25mm/px · z∈[-167,+142]mm · 4 of 104 slices shown (6 of 9)]
[im 1/104]
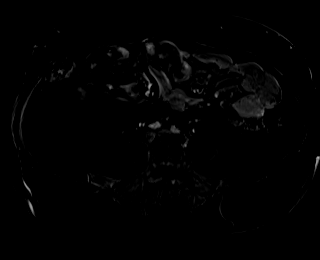
[im 35/104]
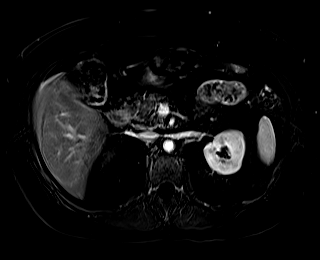
[im 69/104]
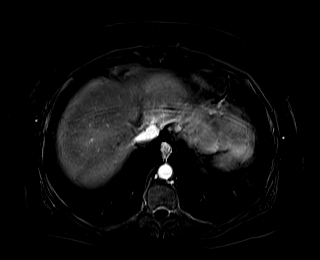
[im 104/104]
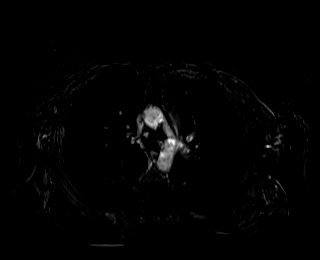

[Series 29: T1 dynamic · coronal · 5.0mm · 1.41mm/px · 2 of 56 slices shown (7 of 9)]
[im 1/56]
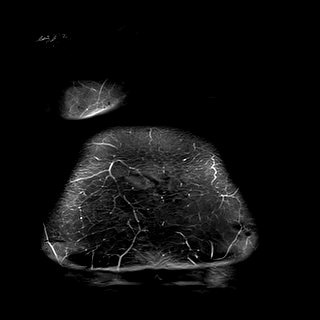
[im 56/56]
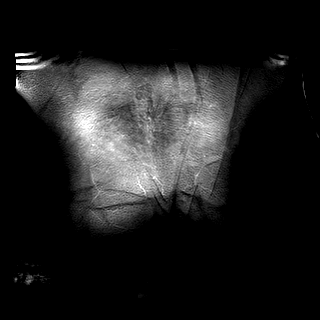

[Series 30: T2 · axial · 6.0mm · 1.56mm/px · 1 of 41 slices shown (2 of 2)]
[im 1/41]
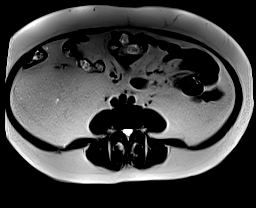

[Series 33: T1 dynamic · axial · 3.0mm · 1.25mm/px · z∈[-167,+142]mm · 4 of 104 slices shown (8 of 9)]
[im 1/104]
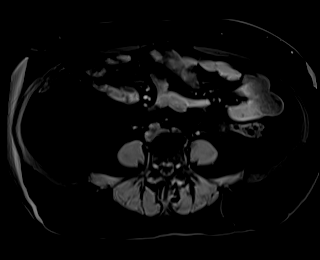
[im 35/104]
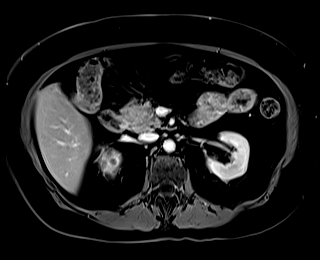
[im 69/104]
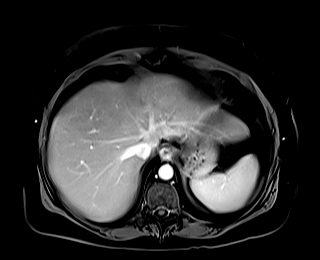
[im 104/104]
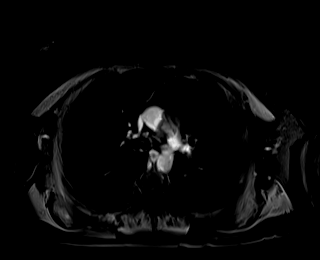

[Series 34: T1 dynamic · axial · 3.0mm · 1.25mm/px · z∈[-167,+142]mm · 4 of 104 slices shown (9 of 9)]
[im 1/104]
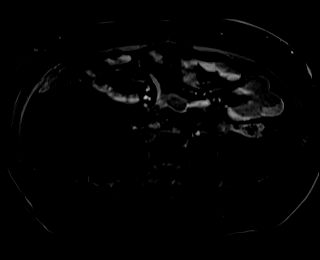
[im 35/104]
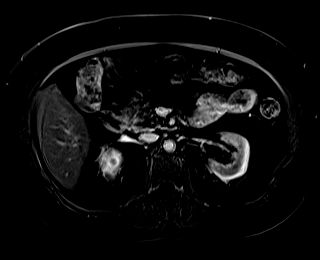
[im 69/104]
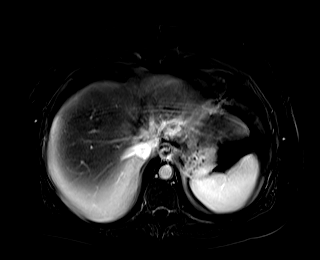
[im 104/104]
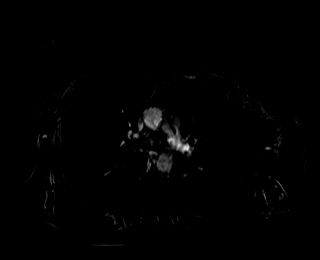

[46 of 48 positions shown; findings below may reference images not displayed]

FINDINGS: Lower chest: No acute findings.

Hepatobiliary: Mild hepatic steatosis. No suspicious liver
abnormality. Status post cholecystectomy. The common bile duct is
upper limits of normal measuring 7 mm, image [DATE]. No signs of
choledocholithiasis.

Pancreas: No mass, inflammatory changes, or other parenchymal
abnormality identified.

Spleen:  Within normal limits in size and appearance.

Adrenals/Urinary Tract: Normal left adrenal gland. Right adrenal
gland is surgically absent.

Bilateral kidney cysts. Mildly complex, nonenhancing cyst arising
off the posterior cortex of the lower pole of right kidney measures
1.6 cm and is compatible with a Bosniak category 2 lesion. Several
additional T2 hyperintense cortical kidney lesions likely represent
small cysts but are technically too small to reliably characterize.
There is moderate dilatation of the inferior pole collecting system
of the left kidney to the level of the UPJ. Mild dilatation of the
inferior pole collecting system of the right kidney is also noted to
the level of the UPJ. Findings may represent sequelae of congenital
or acquired UPJ stenosis. No hydroureter identified bilaterally.

Stomach/Bowel: Visualized portions within the abdomen are
unremarkable.

Vascular/Lymphatic: No pathologically enlarged lymph nodes
identified. No abdominal aortic aneurysm demonstrated.

Other:  No ascites or focal fluid collections.

Musculoskeletal: No suspicious bone lesions identified.
IMPRESSION: 1. No acute findings within the upper abdomen. No complications of
pancreatitis identified.
2. Status post cholecystectomy. The common bile duct is upper limits
of normal measuring 7 mm. No signs of choledocholithiasis.
3. Bilateral kidney cysts. Mildly complex, nonenhancing cyst arising
off the posterior cortex of the lower pole of right kidney is
compatible with a benign Bosniak category 2 lesion.
4. Mild hepatic steatosis.

## 2020-08-02 IMAGING — MR MR ABDOMEN WO/W CM
18 of 19 series · 46 of 48 positions shown · IV contrast (9 GADAVIST)
Comparison: None.

CLINICAL DATA: Weight loss.  Elevated lipase.  Abdominal pain.

EXAM:
MRI ABDOMEN WITHOUT AND WITH CONTRAST
TECHNIQUE: Multiplanar multisequence MR imaging of the abdomen was performed
both before and after the administration of intravenous contrast.
CONTRAST:  9mL GADAVIST GADOBUTROL 1 MMOL/ML IV SOLN

[Series 2: DWI · axial · 6.0mm · 1.49mm/px · z∈[-145,+136]mm · 4 of 80 slices shown (1 of 2)]
[im 1/80]
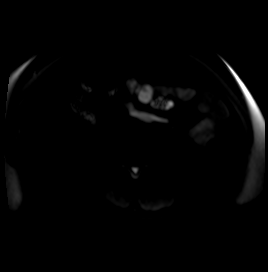
[im 27/80]
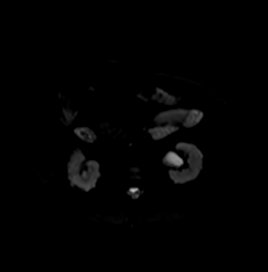
[im 53/80]
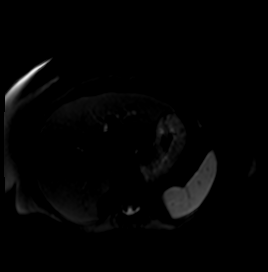
[im 80/80]
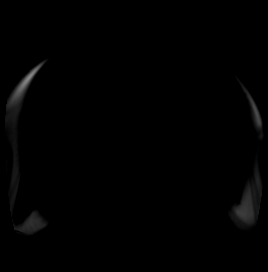

[Series 3: DWI · axial · 6.0mm · 1.49mm/px · z∈[-145,+136]mm · 2 of 40 slices shown (2 of 2)]
[im 1/40]
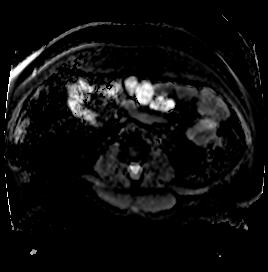
[im 40/40]
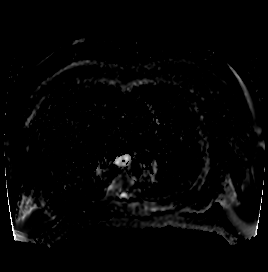

[Series 4: T2 fat-sat · axial · 6.0mm · 1.25mm/px · z∈[-143,+130]mm · 2 of 39 slices shown]
[im 1/39]
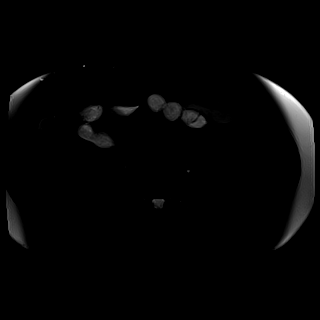
[im 39/39]
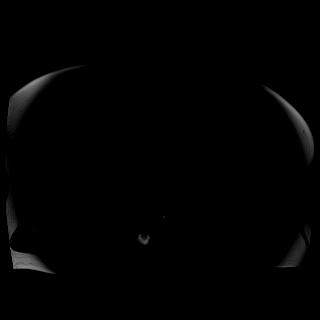

[Series 7: T2 · coronal · 7.0mm · 1.56mm/px · 1 of 36 slices shown (1 of 2)]
[im 1/36]
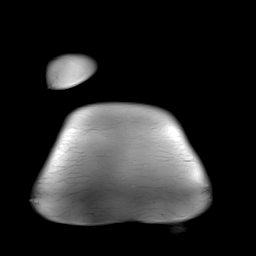

[Series 8: T1 · axial · 3.1mm · 1.25mm/px · z∈[-146,+124]mm · 3 of 88 slices shown (1 of 2)]
[im 1/88]
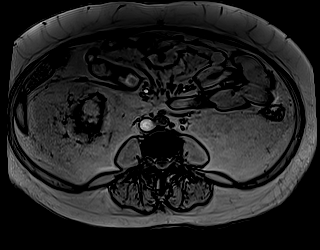
[im 44/88]
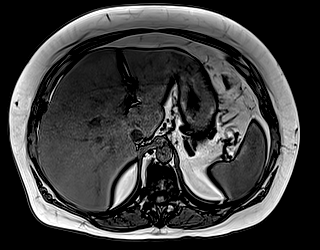
[im 88/88]
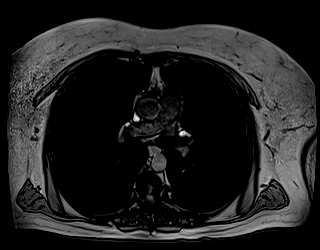

[Series 9: T1 · axial · 3.1mm · 1.25mm/px · z∈[-146,+124]mm · 3 of 88 slices shown (2 of 2)]
[im 1/88]
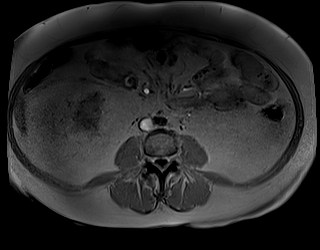
[im 44/88]
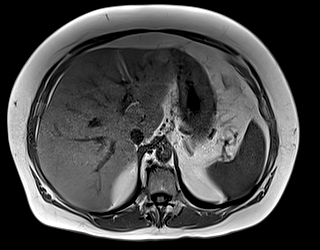
[im 88/88]
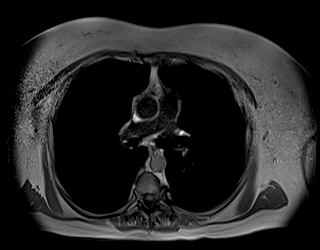

[Series 10: bSSFP · axial · 7.0mm · 1.25mm/px · 1 of 33 slices shown]
[im 1/33]
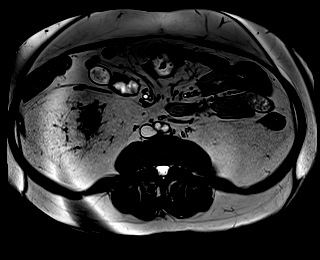

[Series 13: T1 dynamic · axial · 3.0mm · 1.25mm/px · z∈[-167,+142]mm · 3 of 104 slices shown (1 of 10)]
[im 1/104]
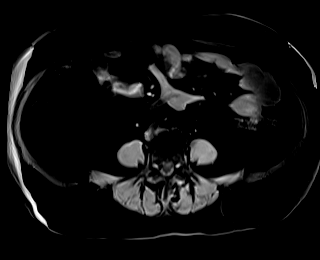
[im 52/104]
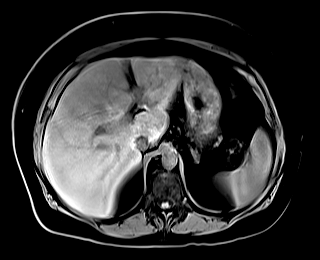
[im 104/104]
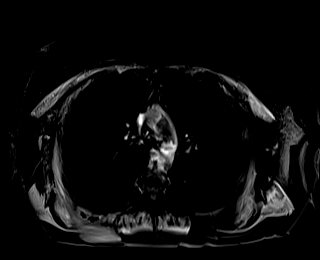

[Series 17: T1 dynamic · axial · 3.0mm · 1.25mm/px · z∈[-167,+142]mm · 3 of 104 slices shown (2 of 10)]
[im 1/104]
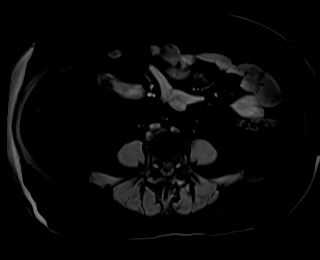
[im 52/104]
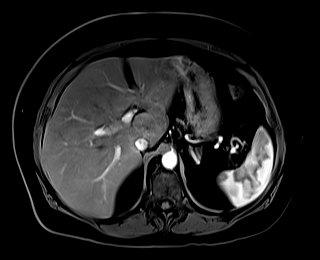
[im 104/104]
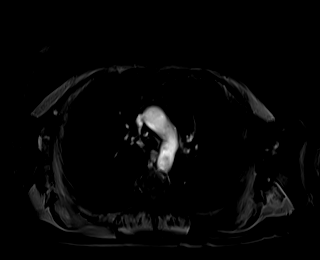

[Series 18: T1 dynamic · axial · 3.0mm · 1.25mm/px · z∈[-167,+142]mm · 3 of 104 slices shown (3 of 10)]
[im 1/104]
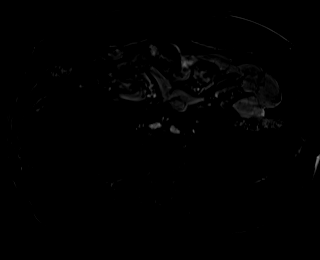
[im 52/104]
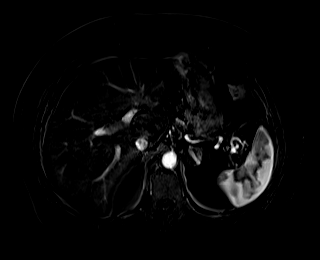
[im 104/104]
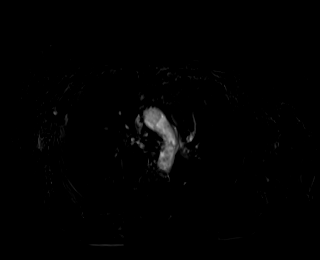

[Series 21: T1 dynamic · axial · 3.0mm · 1.25mm/px · z∈[-167,+142]mm · 3 of 104 slices shown (4 of 10)]
[im 1/104]
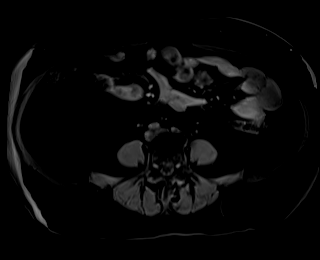
[im 52/104]
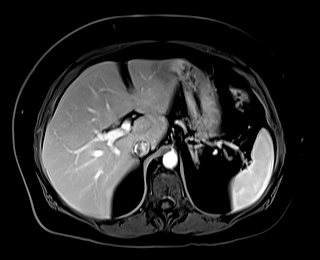
[im 104/104]
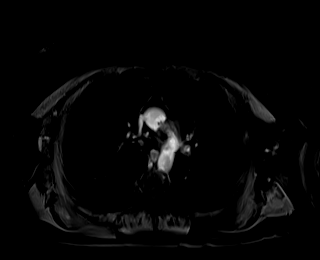

[Series 22: T1 dynamic · axial · 3.0mm · 1.25mm/px · z∈[-167,+142]mm · 3 of 104 slices shown (5 of 10)]
[im 1/104]
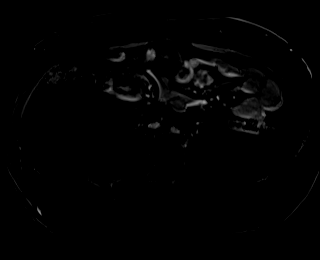
[im 52/104]
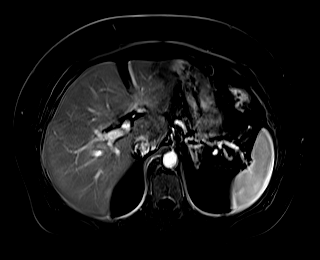
[im 104/104]
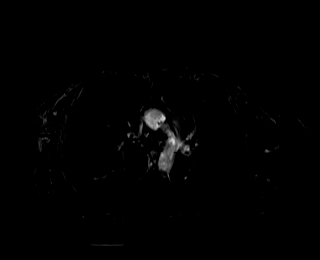

[Series 25: T1 dynamic · axial · 3.0mm · 1.25mm/px · z∈[-167,+142]mm · 3 of 104 slices shown (6 of 10)]
[im 1/104]
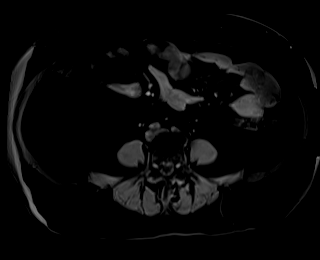
[im 52/104]
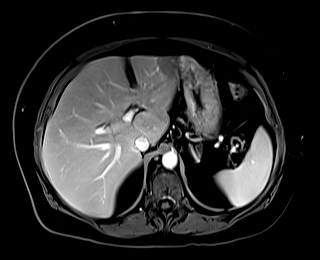
[im 104/104]
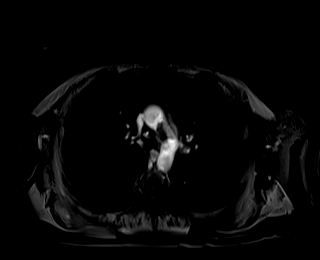

[Series 26: T1 dynamic · axial · 3.0mm · 1.25mm/px · z∈[-167,+142]mm · 3 of 104 slices shown (7 of 10)]
[im 1/104]
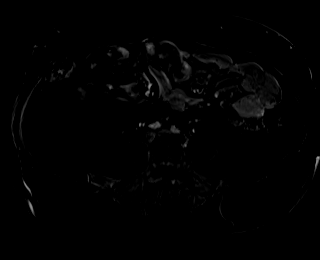
[im 52/104]
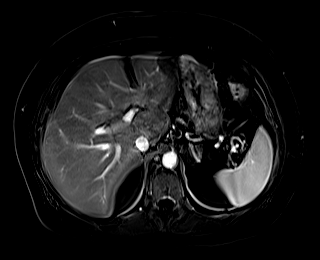
[im 104/104]
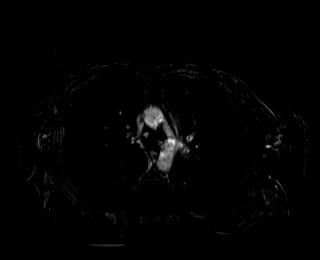

[Series 29: T1 dynamic · coronal · 5.0mm · 1.41mm/px · 2 of 56 slices shown (8 of 10)]
[im 1/56]
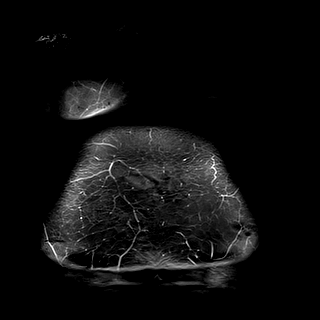
[im 56/56]
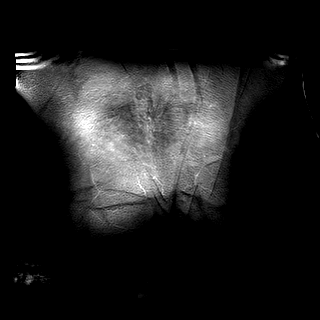

[Series 30: T2 · axial · 6.0mm · 1.56mm/px · 1 of 41 slices shown (2 of 2)]
[im 1/41]
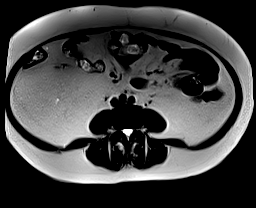

[Series 33: T1 dynamic · axial · 3.0mm · 1.25mm/px · z∈[-167,+142]mm · 3 of 104 slices shown (9 of 10)]
[im 1/104]
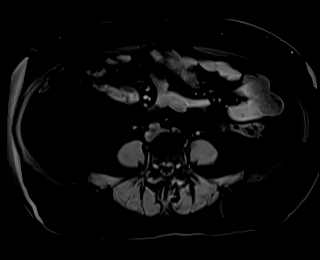
[im 52/104]
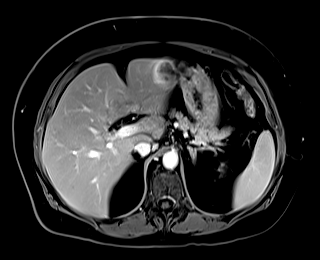
[im 104/104]
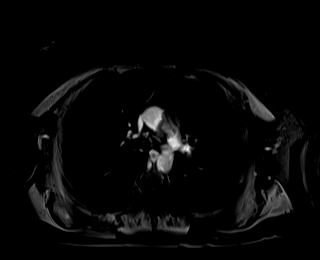

[Series 34: T1 dynamic · axial · 3.0mm · 1.25mm/px · z∈[-167,+142]mm · 3 of 104 slices shown (10 of 10)]
[im 1/104]
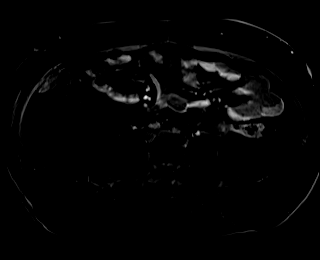
[im 52/104]
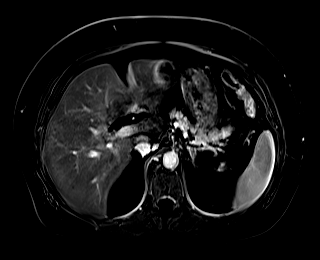
[im 104/104]
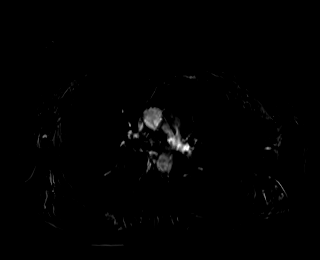

[46 of 48 positions shown; findings below may reference images not displayed]

FINDINGS: Lower chest: No acute findings.

Hepatobiliary: Mild hepatic steatosis. No suspicious liver
abnormality. Status post cholecystectomy. The common bile duct is
upper limits of normal measuring 7 mm, image [DATE]. No signs of
choledocholithiasis.

Pancreas: No mass, inflammatory changes, or other parenchymal
abnormality identified.

Spleen:  Within normal limits in size and appearance.

Adrenals/Urinary Tract: Normal left adrenal gland. Right adrenal
gland is surgically absent.

Bilateral kidney cysts. Mildly complex, nonenhancing cyst arising
off the posterior cortex of the lower pole of right kidney measures
1.6 cm and is compatible with a Bosniak category 2 lesion. Several
additional T2 hyperintense cortical kidney lesions likely represent
small cysts but are technically too small to reliably characterize.
There is moderate dilatation of the inferior pole collecting system
of the left kidney to the level of the UPJ. Mild dilatation of the
inferior pole collecting system of the right kidney is also noted to
the level of the UPJ. Findings may represent sequelae of congenital
or acquired UPJ stenosis. No hydroureter identified bilaterally.

Stomach/Bowel: Visualized portions within the abdomen are
unremarkable.

Vascular/Lymphatic: No pathologically enlarged lymph nodes
identified. No abdominal aortic aneurysm demonstrated.

Other:  No ascites or focal fluid collections.

Musculoskeletal: No suspicious bone lesions identified.
IMPRESSION: 1. No acute findings within the upper abdomen. No complications of
pancreatitis identified.
2. Status post cholecystectomy. The common bile duct is upper limits
of normal measuring 7 mm. No signs of choledocholithiasis.
3. Bilateral kidney cysts. Mildly complex, nonenhancing cyst arising
off the posterior cortex of the lower pole of right kidney is
compatible with a benign Bosniak category 2 lesion.
4. Mild hepatic steatosis.

## 2020-08-02 MED ORDER — GADOBUTROL 1 MMOL/ML IV SOLN
9.0000 mL | Freq: Once | INTRAVENOUS | Status: AC | PRN
  Administered 2020-08-02: 9 mL via INTRAVENOUS

## 2020-08-04 ENCOUNTER — Telehealth: Payer: Self-pay | Admitting: Nurse Practitioner

## 2020-08-04 NOTE — Telephone Encounter (Signed)
Spoke with the patient and explained this. She is fine with this plan. She is presently scheduled for follow up on 10/04/20.

## 2020-08-04 NOTE — Telephone Encounter (Signed)
Inbound call from patient requesting call back regarding results.

## 2020-08-04 NOTE — Telephone Encounter (Signed)
Spoke with the patient. She is aware the provider has not reviewed her MRI that was done 08/02/20. She has seen her results through the patient portal and is pleased the pancreas is okay. She is curious as to what may have caused the elevated lipase.

## 2020-08-15 ENCOUNTER — Ambulatory Visit: Payer: BC Managed Care – PPO | Admitting: Gastroenterology

## 2020-08-15 ENCOUNTER — Other Ambulatory Visit: Payer: Self-pay | Admitting: Internal Medicine

## 2020-08-16 ENCOUNTER — Encounter: Payer: Self-pay | Admitting: Internal Medicine

## 2020-08-19 ENCOUNTER — Ambulatory Visit: Payer: BC Managed Care – PPO | Admitting: Gastroenterology

## 2020-09-16 ENCOUNTER — Ambulatory Visit: Payer: BC Managed Care – PPO | Admitting: Internal Medicine

## 2020-09-19 LAB — HM MAMMOGRAPHY

## 2020-09-21 ENCOUNTER — Other Ambulatory Visit: Payer: Self-pay | Admitting: Internal Medicine

## 2020-09-21 DIAGNOSIS — E119 Type 2 diabetes mellitus without complications: Secondary | ICD-10-CM

## 2020-09-22 ENCOUNTER — Other Ambulatory Visit: Payer: Self-pay

## 2020-09-23 ENCOUNTER — Ambulatory Visit (INDEPENDENT_AMBULATORY_CARE_PROVIDER_SITE_OTHER): Payer: BC Managed Care – PPO | Admitting: Internal Medicine

## 2020-09-23 ENCOUNTER — Encounter: Payer: Self-pay | Admitting: Internal Medicine

## 2020-09-23 VITALS — BP 110/72 | HR 83 | Temp 98.8°F | Wt 202.2 lb

## 2020-09-23 DIAGNOSIS — E039 Hypothyroidism, unspecified: Secondary | ICD-10-CM | POA: Diagnosis not present

## 2020-09-23 DIAGNOSIS — I10 Essential (primary) hypertension: Secondary | ICD-10-CM | POA: Diagnosis not present

## 2020-09-23 DIAGNOSIS — R221 Localized swelling, mass and lump, neck: Secondary | ICD-10-CM

## 2020-09-23 DIAGNOSIS — E785 Hyperlipidemia, unspecified: Secondary | ICD-10-CM

## 2020-09-23 DIAGNOSIS — E119 Type 2 diabetes mellitus without complications: Secondary | ICD-10-CM

## 2020-09-23 DIAGNOSIS — K7581 Nonalcoholic steatohepatitis (NASH): Secondary | ICD-10-CM

## 2020-09-23 LAB — POCT GLYCOSYLATED HEMOGLOBIN (HGB A1C): Hemoglobin A1C: 6 % — AB (ref 4.0–5.6)

## 2020-09-23 NOTE — Progress Notes (Signed)
Established Patient Office Visit     This visit occurred during the SARS-CoV-2 public health emergency.  Safety protocols were in place, including screening questions prior to the visit, additional usage of staff PPE, and extensive cleaning of exam room while observing appropriate contact time as indicated for disinfecting solutions.    CC/Reason for Visit: 68-monthfollow-up chronic medical conditions  HPI: Krista Gladwinis a 55y.o. female who is coming in today for the above mentioned reasons. Past Medical History is significant for: Well-controlled hypertension, hyperlipidemia, hypothyroidism, type 2 diabetes on Metformin and Trulicity as well//transaminitis followed by GI.  She had been dealing with a lot of abdominal bloating.  She had an MRI ordered by GI and that was relatively normal.  Symptoms improved when omeprazole was switched over to pantoprazole.  She also saw a functional medicine specialist who diagnosed her with a gluten insensitivity and now she is taking approximately 10 new supplements.  She feels significantly improved.  She has noticed a soft tissue mass over her right lower neck/upper chest wall area.  It is not painful.   Past Medical/Surgical History: Past Medical History:  Diagnosis Date   Allergy    Asthma    exercise induced - not used inhaler couple of yrs pt reported 10-03-2018   Blood transfusion without reported diagnosis    Colon polyp    Depression    [facility]    GERD (gastroesophageal reflux disease)    HTN (hypertension)    Hyperlipidemia    Hypothyroidism    NASH (nonalcoholic steatohepatitis)    Obesity (BMI 30.0-34.9)    Post menopausal problems    Sleep apnea    no c-pap   Transaminitis    Vitamin D deficiency     Past Surgical History:  Procedure Laterality Date   ADRENALECTOMY Right    APPENDECTOMY     BREAST REDUCTION SURGERY     CHOLECYSTECTOMY     HYSTERECTOMY ABDOMINAL WITH SALPINGECTOMY      LIVER BIOPSY  2008, 2013   NASH   TONSILLECTOMY     UPPER GASTROINTESTINAL ENDOSCOPY      Social History:  reports that she quit smoking about 13 years ago. Her smoking use included cigarettes. She has never used smokeless tobacco. She reports current alcohol use. She reports that she does not use drugs.  Allergies: Allergies  Allergen Reactions   Spironolactone Hives and Swelling   Paxil [Paroxetine Hcl] Other (See Comments)    Other reaction(s): Other (comments) WORSENED DEPRESSION. WORSENED DEPRESSION.    Codeine Itching and Rash   Elemental Sulfur Rash    Family History:  Family History  Problem Relation Age of Onset   Breast cancer Mother    Colon polyps Mother    Stroke Mother 582  GER disease Father    Diabetes Father    Kidney disease Father    CAD Father    Other Father        cause of death listed as resp. failure   Colon polyps Sister    Stroke Sister    Diabetes Maternal Grandfather    Heart disease Maternal Grandfather    Diabetes Paternal Grandmother    CAD Paternal Grandfather    Pancreatic cancer Paternal Uncle    Pancreatic cancer Maternal Uncle    Colon cancer Neg Hx    Esophageal cancer Neg Hx    Stomach cancer Neg Hx    Rectal cancer Neg Hx  Current Outpatient Medications:    aspirin EC 81 MG tablet, Take 81 mg by mouth daily., Disp: , Rfl:    Blood Glucose Monitoring Suppl (CONTOUR NEXT ONE) KIT, by Does not apply route., Disp: , Rfl:    Cholecalciferol (VITAMIN D3) 50 MCG (2000 UT) TABS, Take by mouth., Disp: , Rfl:    clobetasol cream (TEMOVATE) 3.33 %, Apply 1 application topically at bedtime. Apply thin film to external vulvar area nightly x 2 weeks, every other night x 2 weeks, then 2 nights per week, Disp: 30 g, Rfl: 3   hydrochlorothiazide (HYDRODIURIL) 25 MG tablet, TAKE ONE TABLET BY MOUTH DAILY, Disp: 90 tablet, Rfl: 1   levothyroxine (SYNTHROID) 88 MCG tablet, TAKE 1 TABLET(88 MCG) BY MOUTH DAILY BEFORE AND BREAKFAST, Disp: 90  tablet, Rfl: 0   lisinopril (ZESTRIL) 20 MG tablet, TAKE 1 TABLET(20 MG TOTAL) BY MOUTH DAILY, Disp: 90 tablet, Rfl: 1   Melatonin 10 MG CAPS, Take by mouth., Disp: , Rfl:    metFORMIN (GLUCOPHAGE) 500 MG tablet, TAKE TWO TABLETS BY MOUTH TWO TIMES DAILY. GENERIC EQUIVALENT FOR GLUCOPHAGE., Disp: 360 tablet, Rfl: 1   Microlet Lancets MISC, by Does not apply route. Test 3-4 times daily (Micorlet Colored Lancets), Disp: , Rfl:    Omega-3 Fatty Acids (FISH OIL) 1000 MG CAPS, Take by mouth., Disp: , Rfl:    pantoprazole (PROTONIX) 40 MG tablet, Take 1 tablet (40 mg total) by mouth daily. Take 30 minutes before breakfast., Disp: 90 tablet, Rfl: 0   rosuvastatin (CRESTOR) 10 MG tablet, TAKE 1 TABLET(10 MG TOTAL) BY MOUTH DAILY., Disp: 90 tablet, Rfl: 1   TRULICITY 1.5 OV/2.9VB SOPN, INJECT 1.5MG UNDER THE SKIN ONCE A WEEK, Disp: 6 mL, Rfl: 2   venlafaxine XR (EFFEXOR-XR) 37.5 MG 24 hr capsule, TAKE ONE CAPSULE BY MOUTH DAILY. PLEASE SCHEDULE A PHYSICAL FOR MORE REFILLS, Disp: 90 capsule, Rfl: 0  Review of Systems:  Constitutional: Denies fever, chills, diaphoresis, appetite change and fatigue.  HEENT: Denies photophobia, eye pain, redness, hearing loss, ear pain, congestion, sore throat, rhinorrhea, sneezing, mouth sores, trouble swallowing, neck pain, neck stiffness and tinnitus.   Respiratory: Denies SOB, DOE, cough, chest tightness,  and wheezing.   Cardiovascular: Denies chest pain, palpitations and leg swelling.  Gastrointestinal: Denies nausea, vomiting, abdominal pain, diarrhea, constipation, blood in stool and abdominal distention.  Genitourinary: Denies dysuria, urgency, frequency, hematuria, flank pain and difficulty urinating.  Endocrine: Denies: hot or cold intolerance, sweats, changes in hair or nails, polyuria, polydipsia. Musculoskeletal: Denies myalgias, back pain, joint swelling, arthralgias and gait problem.  Skin: Denies pallor, rash and wound.  Neurological: Denies dizziness,  seizures, syncope, weakness, light-headedness, numbness and headaches.  Hematological: Denies adenopathy. Easy bruising, personal or family bleeding history  Psychiatric/Behavioral: Denies suicidal ideation, mood changes, confusion, nervousness, sleep disturbance and agitation    Physical Exam: Vitals:   09/23/20 0930  BP: 110/72  Pulse: 83  Temp: 98.8 F (37.1 C)  TempSrc: Oral  SpO2: 97%  Weight: 202 lb 3.2 oz (91.7 kg)    Body mass index is 33.14 kg/m.   Constitutional: NAD, calm, comfortable Eyes: PERRL, lids and conjunctivae normal, wears corrective lenses ENMT: Mucous membranes are moist.  Neck: Soft, very mobile mass approximately 3 inches in diameter over her right lower neck/upper chest wall area. Respiratory: clear to auscultation bilaterally, no wheezing, no crackles. Normal respiratory effort. No accessory muscle use.  Cardiovascular: Regular rate and rhythm, no murmurs / rubs / gallops. No extremity edema.  Neurologic: Grossly intact and nonfocal Psychiatric: Normal judgment and insight. Alert and oriented x 3. Normal mood.    Impression and Plan:  Type 2 diabetes mellitus without complication, without long-term current use of insulin ([place])  - Plan: POCT glycosylated hemoglobin (Hb A1C) -Excellent control with an A1c of 6.0 today.  Primary hypertension -Well-controlled  Hyperlipidemia, unspecified hyperlipidemia type -Last lipid panel in November 2021 with total cholesterol 148, triglycerides 140 and LDL 71.  She is currently on rosuvastatin.  Hypothyroidism, unspecified type -Continue levothyroxine, have advised that she stop taking thyroid supplement prescribed by functional medicine specialist, recheck TSH next visit.  NASH (nonalcoholic steatohepatitis) -LFTs have been stable, followed by GI.  Mass in neck -Suspect this is a lipoma, sent for ultrasound for characterization.  Time spent: 33 minutes reviewing chart, interviewing and examining  patient and formulating plan of care.   Patient Instructions  -Nice seeing you today!!  -Schedule follow up in 3 months for your physical. Please come in fasting that day.   Lelon Frohlich, MD [facility]

## 2020-09-23 NOTE — Patient Instructions (Signed)
-  Nice seeing you today!!  -Schedule follow up in 3 months for your physical. Please come in fasting that day.

## 2020-09-30 ENCOUNTER — Encounter: Payer: Self-pay | Admitting: Internal Medicine

## 2020-10-04 ENCOUNTER — Ambulatory Visit (INDEPENDENT_AMBULATORY_CARE_PROVIDER_SITE_OTHER): Payer: BC Managed Care – PPO | Admitting: Gastroenterology

## 2020-10-04 ENCOUNTER — Other Ambulatory Visit (INDEPENDENT_AMBULATORY_CARE_PROVIDER_SITE_OTHER): Payer: BC Managed Care – PPO

## 2020-10-04 ENCOUNTER — Encounter: Payer: Self-pay | Admitting: Gastroenterology

## 2020-10-04 DIAGNOSIS — R140 Abdominal distension (gaseous): Secondary | ICD-10-CM

## 2020-10-04 DIAGNOSIS — R110 Nausea: Secondary | ICD-10-CM

## 2020-10-04 DIAGNOSIS — R6881 Early satiety: Secondary | ICD-10-CM

## 2020-10-04 DIAGNOSIS — R748 Abnormal levels of other serum enzymes: Secondary | ICD-10-CM

## 2020-10-04 LAB — LIPASE: Lipase: 121 U/L — ABNORMAL HIGH (ref 11.0–59.0)

## 2020-10-04 MED ORDER — PANTOPRAZOLE SODIUM 40 MG PO TBEC
40.0000 mg | DELAYED_RELEASE_TABLET | Freq: Every day | ORAL | 3 refills | Status: DC
Start: 2020-10-04 — End: 2021-10-16

## 2020-10-04 MED ORDER — PANTOPRAZOLE SODIUM 40 MG PO TBEC: 40.0000 mg | DELAYED_RELEASE_TABLET | Freq: Every day | ORAL | 11 refills | Status: DC

## 2020-10-04 NOTE — Progress Notes (Addendum)
Referring Provider: Isaac Bliss, [facility]* Primary Care Physician:  Isaac Bliss, Rayford Halsted, MD  Chief complaint: Nausea, bloating, early satiety, abnormal liver enzymes  IMPRESSION:  Elevated lipase - ? 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/2019: F0 fibrosis, S3 steatosis, N2 NASH Hyperplastic polyp on colonoscopy 10/03/18 Family history of colon polyps    - Sister adenoma >1.0 cm, CHEK-2 mutation    - Mother with colon polyps and breast cancer Family history of pancreatic cancer (Maternal uncle and paternal uncle <60) BMI 34  Acute symptoms with elevated lipase: Although symptoms consistent with those present prior to 14 days of Xifaxan, history and elevated lipase make acute pancreatitis possible despite normal pancreas on MRI. Etiology of acute pancreatitis unclear. Will repeat lipase today. Low threshold to pursue EUS given family history.   History of suspected SIBO: Symptoms initially  resolved with Xifaxan 550 mg TID for 14 days. Continue to use dicyclomine PRN. Consider repeat antibiotics with recurrent symptoms.   Reflux and gastritis on recent EGD:  Continue pantoprazole 40 mg QD. Hopefully, her GI symptoms will improve with additional weight loss.   Fatty liver: Plan restaging within the next year given her concurrent diabetes.   Family history of polyps: No precancerous polyps on colonoscopy  09/2018. Surveillance colonoscopy recommended in 2025 given the family history.    PLAN: - Continue pantoprazole 40 mg QD (one year refill today) - Work to maintain a healthy weight and maximize control of diabetes and hyperlipidemia - Lipase level today - Colonoscopy 2025 - Follow-up in the office as needed, at least annually   HPI: Krista Dawson is a 55 y.o. female who returns in follow-up. 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  She has a history of iron deficiency anemia that has resolved. She was previously followed by [facility] hepatology for NASH. Please see prior notes for complete details.   I initially met her for screening colonoscopy 10/03/2018.  A small hyperplastic polyp was removed at that time. Surveillance colonoscopy recommended in 5 years given her family history.  Referred in 2021 for fatty liver and elevated liver enzymes. Abdominal ultrasound 02/23/19 to follow-up on the increase in her liver enzymes showed increased liver echogenicty likely reflecting steatosis. At the time of that consultation, she reported symptoms of dyspepsia.  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  Esophageal biopsies showed reflux.  Gastric biopsy showed mild reactive gastropathy and mild chronic gastritis.  There was no H. pylori.  Duodenal biopsies were normal.  Follow-up gastric emptying scan to evaluate for gastroparesis given the retained food was normal.  She was successfully  treated with 14 days of Xifaxan and dicyclomine in April 2021 for post-prandial bloating, abdominal pressure and nausea. Bowel habits returned to  normal after treatment.   Had an urgent work-in appointment in July with Carl Best, NP for recurrent symptoms. Switch from omeprazole to pantoprazole provided relief within a few days without need for repeat Xifaxan. Labs showed an elevated lipase at 101. CBC and liver enzymes were normal. MRI/MRCP 08/03/20 showed no acute findings. Pancreas was normal. Fatty liver. Prior cholecystecytomy.   She has regained the weight she initially lost during the most severe of the symptoms.   Gastric mapping through chiropractor Eugenia Pancoast) identified gluten sensitivity. She was started on multiple supplements and feels that this is also improving her symptoms.     Past Medical History:  Diagnosis Date   Allergy    Asthma    exercise induced - not used inhaler couple of yrs pt reported 10-03-2018   Blood transfusion without reported diagnosis    Colon polyp    Depression    DM (diabetes mellitus) ([place])    GERD (gastroesophageal reflux disease)    HTN (hypertension)    Hyperlipidemia    Hypothyroidism    NASH (nonalcoholic steatohepatitis)    Obesity (BMI 30.0-34.9)    Post menopausal problems    Sleep apnea    no c-pap   Transaminitis    Vitamin D deficiency     Past Surgical History:  Procedure Laterality Date   ADRENALECTOMY Right    APPENDECTOMY     BREAST REDUCTION SURGERY     CHOLECYSTECTOMY     HYSTERECTOMY ABDOMINAL WITH SALPINGECTOMY     LIVER BIOPSY  2008, 2013   NASH   TONSILLECTOMY     UPPER GASTROINTESTINAL ENDOSCOPY      Current Outpatient Medications  Medication Sig Dispense Refill   aspirin EC 81 MG tablet Take 81 mg by mouth daily.     Blood Glucose Monitoring Suppl (CONTOUR NEXT ONE) KIT by Does not apply route.     Calcium-Magnesium 200-50 MG TABS Take by mouth.     Cholecalciferol (VITAMIN D3) 50 MCG (2000 UT) TABS Take by mouth.     clobetasol cream (TEMOVATE) 0.56 % Apply 1 application topically at bedtime. Apply thin film to external vulvar  area nightly x 2 weeks, every other night x 2 weeks, then 2 nights per week 30 g 3   Garlic 979 MG TABS Take by mouth.     hydrochlorothiazide (HYDRODIURIL) 25 MG tablet TAKE ONE TABLET BY MOUTH DAILY 90 tablet 1   L-glutamine (ENDARI) 5 g PACK Powder Packet Take by mouth 2 (two) times daily.     levothyroxine (SYNTHROID) 88 MCG tablet TAKE 1 TABLET(88 MCG) BY MOUTH DAILY BEFORE AND BREAKFAST 90 tablet 0   lisinopril (ZESTRIL) 20 MG tablet TAKE 1 TABLET(20 MG TOTAL) BY MOUTH DAILY 90 tablet 1   Melatonin 10 MG CAPS Take by mouth.     metFORMIN (GLUCOPHAGE) 500 MG tablet TAKE TWO TABLETS BY MOUTH TWO TIMES DAILY. GENERIC EQUIVALENT FOR GLUCOPHAGE. 360 tablet 1   Microlet Lancets MISC by Does not apply route. Test 3-4 times daily (Micorlet Colored Lancets)     Omega-3 Fatty Acids (FISH OIL) 1000 MG CAPS Take by mouth.     OREGANO PO Take by mouth.     PREBIOTIC PRODUCT PO Take by mouth.     rosuvastatin (CRESTOR) 10 MG tablet TAKE 1 TABLET(10 MG TOTAL) BY MOUTH DAILY. 90 tablet 1   TRULICITY  1.5 MG/0.5ML SOPN INJECT 1.5MG UNDER THE SKIN ONCE A WEEK 6 mL 2   venlafaxine XR (EFFEXOR-XR) 37.5 MG 24 hr capsule TAKE ONE CAPSULE BY MOUTH DAILY. PLEASE SCHEDULE A PHYSICAL FOR MORE REFILLS 90 capsule 0   pantoprazole (PROTONIX) 40 MG tablet Take 1 tablet (40 mg total) by mouth daily. Take 30 minutes before breakfast. 90 tablet 3   No current facility-administered medications for this visit.    Allergies as of 10/04/2020 - Review Complete 10/04/2020  Allergen Reaction Noted   Spironolactone Hives and Swelling 06/23/2009   Paxil [paroxetine hcl] Other (See Comments) 11/09/2013   Codeine Itching and Rash 07/25/2007   Elemental sulfur Rash 08/29/2018    Family History  Problem Relation Age of Onset   Breast cancer Mother    Colon polyps Mother    Stroke Mother 26   GER disease Father    Diabetes Father    Kidney disease Father    CAD Father    Other Father        cause of death listed as  resp. failure   Colon polyps Sister    Stroke Sister    Diabetes Maternal Grandfather    Heart disease Maternal Grandfather    Diabetes Paternal Grandmother    CAD Paternal Grandfather    Pancreatic cancer Paternal Uncle    Pancreatic cancer Maternal Uncle    Colon cancer Neg Hx    Esophageal cancer Neg Hx    Stomach cancer Neg Hx    Rectal cancer Neg Hx       Physical Exam: General:   Alert, in NAD. Appears her stated age.  HEENT: No scleral icterus. No bilateral temporal wasting. No thyromegaly.  Heart:  Regular rate and rhythm; no murmurs Pulm: Clear anteriorly; no wheezing Abdomen:  Soft. Central obesity. Nontender. Nondistended. Normal bowel sounds. No rebound or guarding. No hepatosplenomegaly.  No fluid wave.  LAD: No inguinal or umbilical LAD Extremities:  Without edema. No boney abnormalities.  Neurologic:  Alert and  oriented x4;  grossly normal neurologically; no asterixis or clonus. Skin: No jaundice. No palmar erythema or spider angioma. No Terry's nails.  Psych:  Alert and cooperative. Normal mood and affect.    Clarinda Obi L. Tarri Glenn, MD, MPH 10/04/2020, 8:17 PM

## 2020-10-04 NOTE — Patient Instructions (Signed)
It was my pleasure to provide care to you today. Based on our discussion, I am providing you with my recommendations below:  RECOMMENDATION(S):   LABS:   Please proceed to the basement level for lab work before leaving today. Press "B" on the elevator. The lab is located at the first door on the left as you exit the elevator.  HEALTHCARE LAWS AND MY CHART RESULTS:   Due to recent changes in healthcare laws, you may see results of your imaging and/or laboratory studies on MyChart before I have had a chance to review them.  I understand that in some cases there may be results that are confusing or concerning to you. Please understand that not all results are received at same time and often I may need to interpret multiple results in order to provide you with the best plan of care or course of treatment. Therefore, I ask that you please give me 48 hours to thoroughly review all your results before contacting my office for clarification.   CONTINUE:pantoprazole 70m daily   FOLLOW UP:  I would like for you to follow up with me in as needed, at least annually. Please call the office at [phone] schedule your appointment.  BMI:  If you are age 8279or older, your body mass index should be between 23-30. Your Body mass index is 33.27 kg/m. If this is out of the aforementioned range listed, please consider follow up with your Primary Care Provider.  If you are age 8226or younger, your body mass index should be between 19-25. Your Body mass index is 33.27 kg/m. If this is out of the aformentioned range listed, please consider follow up with your Primary Care Provider.   MY CHART:  The  GI providers would like to encourage you to use [facility] communicate with providers for non-urgent requests or questions.  Due to long hold times on the telephone, sending your provider a message by [facility] be a faster and more efficient way to get a response.  Please allow 48 business hours for  a response.  Please remember that this is for non-urgent requests.   Thank you for trusting me with your gastrointestinal care!    KThornton Park MD, MPH

## 2020-10-07 ENCOUNTER — Other Ambulatory Visit: Payer: Self-pay

## 2020-10-07 ENCOUNTER — Ambulatory Visit (INDEPENDENT_AMBULATORY_CARE_PROVIDER_SITE_OTHER): Payer: BC Managed Care – PPO

## 2020-10-07 DIAGNOSIS — Z23 Encounter for immunization: Secondary | ICD-10-CM

## 2020-10-10 ENCOUNTER — Ambulatory Visit
Admission: RE | Admit: 2020-10-10 | Discharge: 2020-10-10 | Disposition: A | Discharge status: Home / Self Care | Payer: BC Managed Care – PPO | Source: Ambulatory Visit | Attending: Internal Medicine | Admitting: Internal Medicine

## 2020-10-10 DIAGNOSIS — R221 Localized swelling, mass and lump, neck: Secondary | ICD-10-CM

## 2020-10-10 IMAGING — US US SOFT TISSUE HEAD/NECK
1 series · 11 of 11 positions shown · non-contrast
Comparison: None.

CLINICAL DATA: Swelling/mass of right lower neck supraclavicular
region for 3 months.

EXAM:
ULTRASOUND OF HEAD/NECK SOFT TISSUES
TECHNIQUE: Ultrasound examination of the head and neck soft tissues was
performed in the area of clinical concern.

[Series 1: us soft tissue head/neck · 0.07mm/px · 11 acquisitions, 11 frames shown]
[im 1/11]
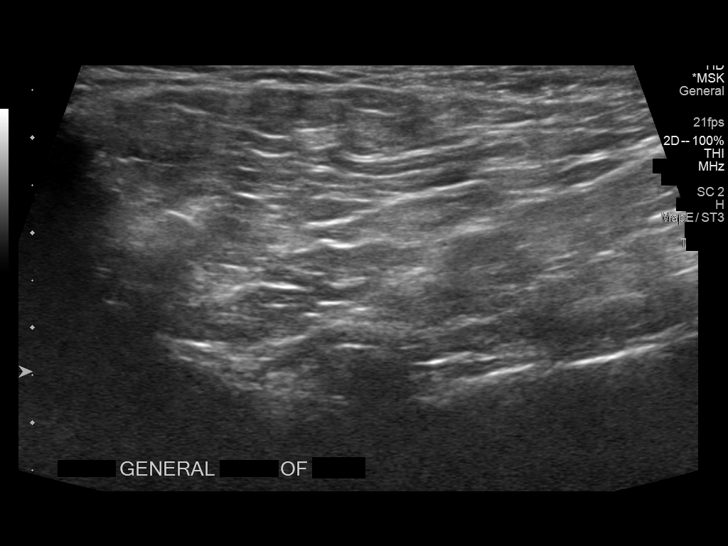
[im 2/11]
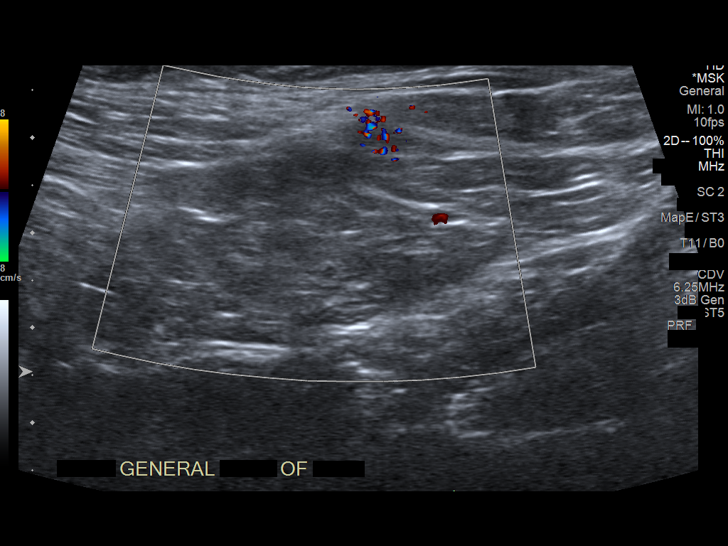
[im 3/11]
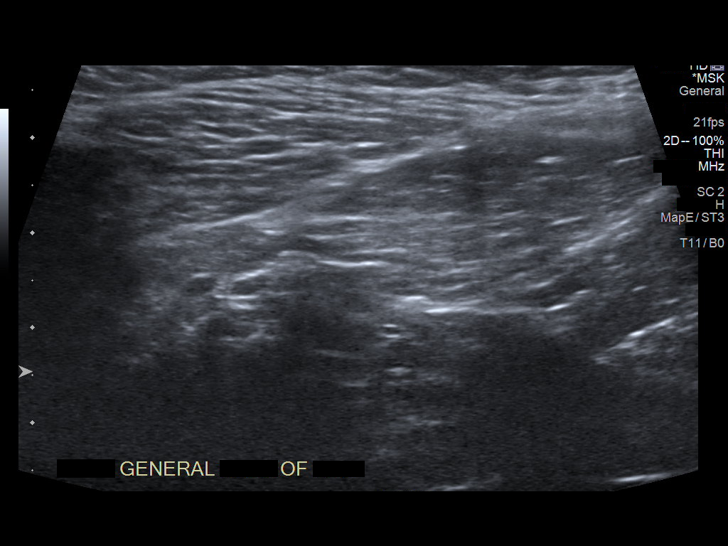
[im 4/11]
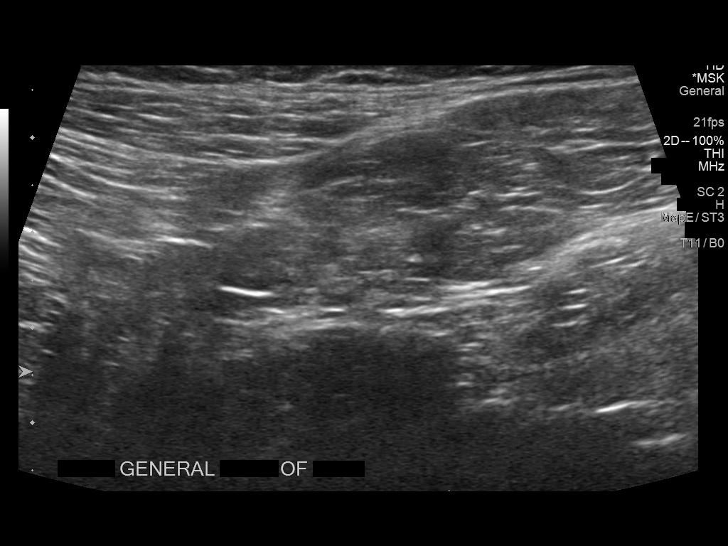
[im 5/11]
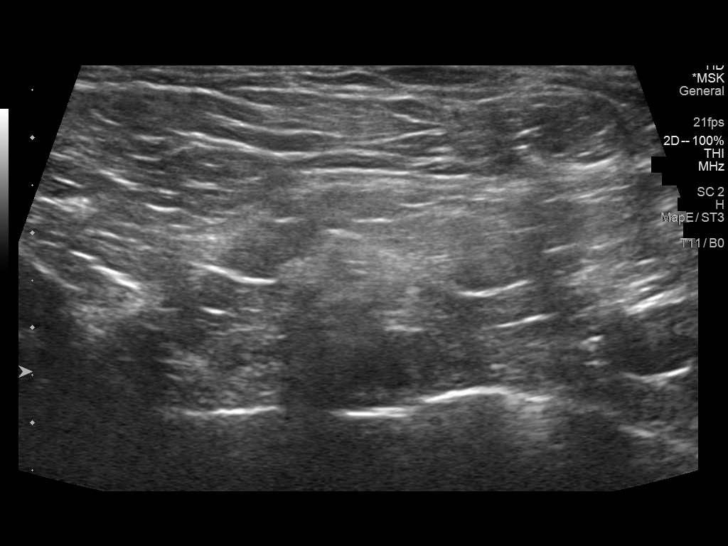
[im 6/11]
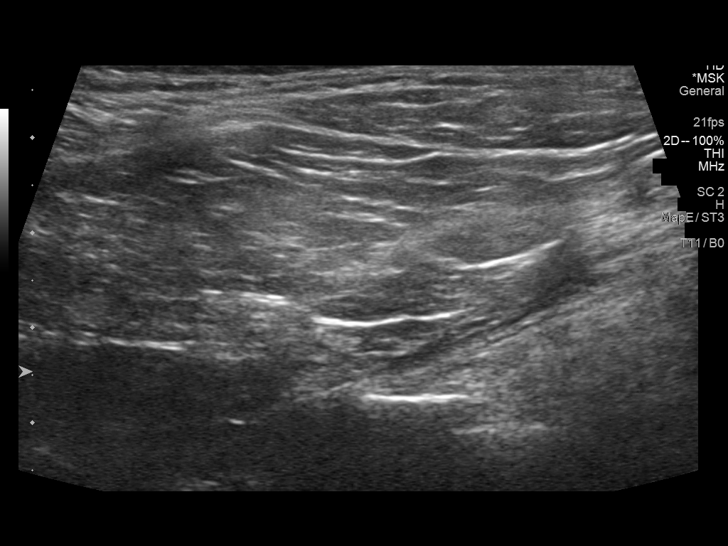
[im 7/11]
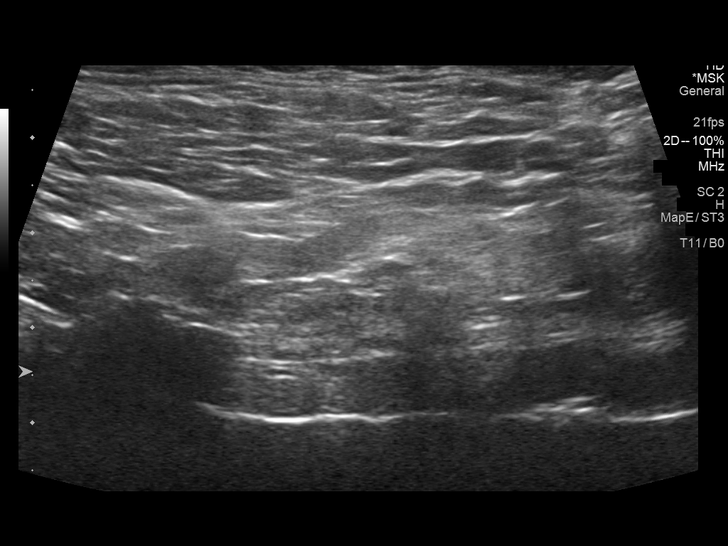
[im 8/11]
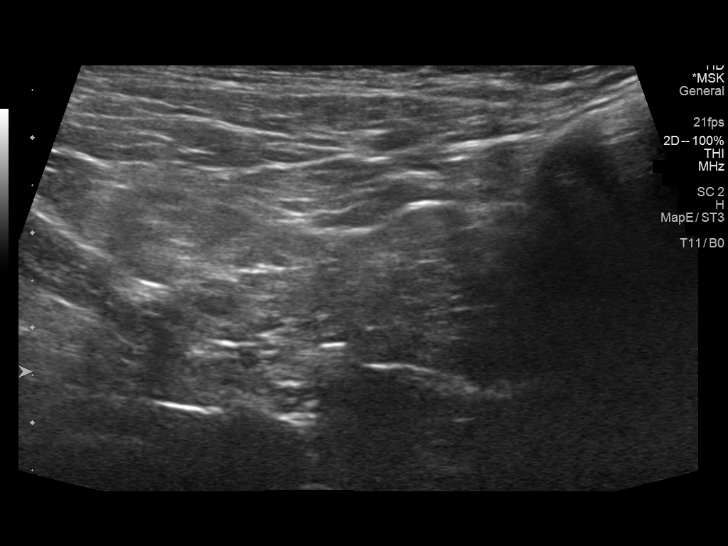
[im 9/11]
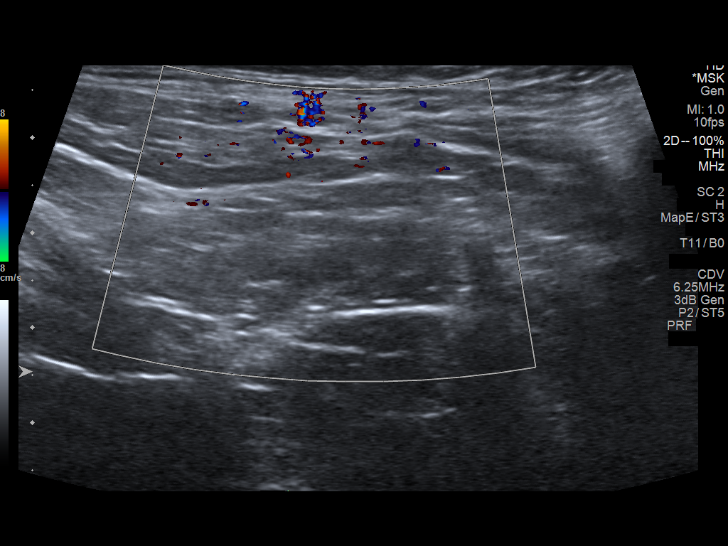
[im 10/11]
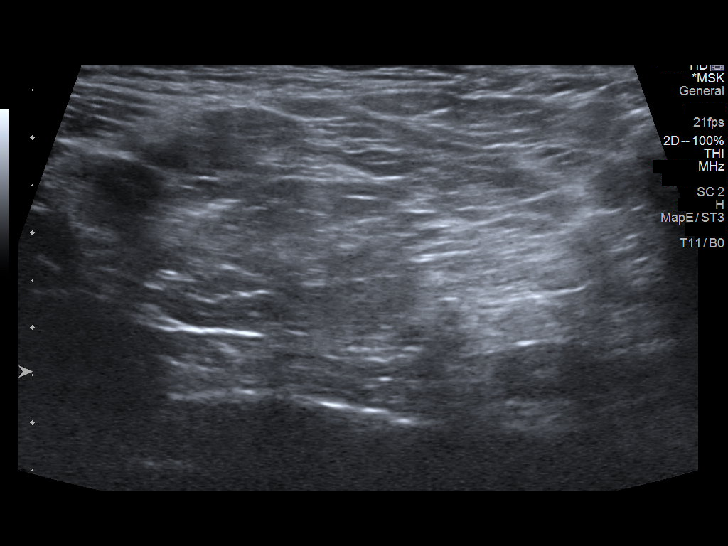
[im 11/11]
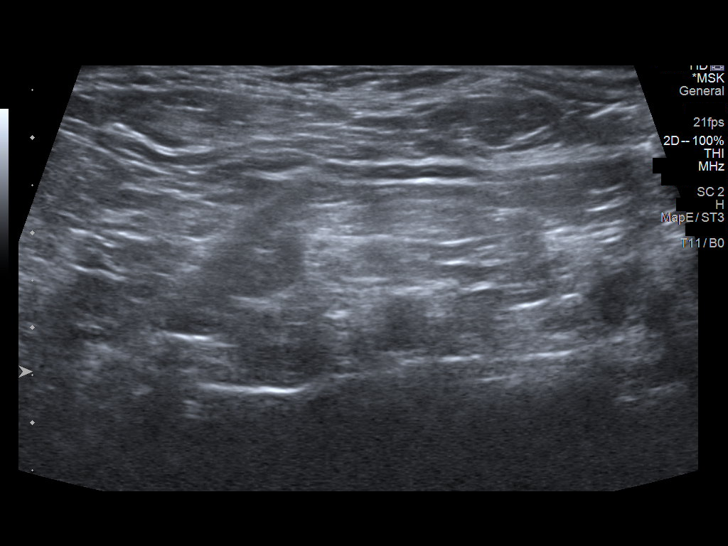

[11 of 11 positions shown; findings below may reference images not displayed]

FINDINGS: Targeted sonographic evaluation of the right supraclavicular region
demonstrates generalized soft tissue swelling without discrete mass.
IMPRESSION: Mild diffuse soft tissue swelling noted in the right supraclavicular
region without discrete mass. If the patient's symptoms persists,
further evaluation with contrast-enhanced CT should be performed.

## 2020-10-11 ENCOUNTER — Other Ambulatory Visit: Payer: Self-pay | Admitting: Internal Medicine

## 2020-10-11 DIAGNOSIS — E039 Hypothyroidism, unspecified: Secondary | ICD-10-CM

## 2020-10-11 DIAGNOSIS — R221 Localized swelling, mass and lump, neck: Secondary | ICD-10-CM

## 2020-10-12 ENCOUNTER — Other Ambulatory Visit: Payer: Self-pay

## 2020-10-12 DIAGNOSIS — Z8719 Personal history of other diseases of the digestive system: Secondary | ICD-10-CM

## 2020-10-12 DIAGNOSIS — R748 Abnormal levels of other serum enzymes: Secondary | ICD-10-CM

## 2020-10-14 ENCOUNTER — Other Ambulatory Visit: Payer: Self-pay

## 2020-10-14 DIAGNOSIS — Z8719 Personal history of other diseases of the digestive system: Secondary | ICD-10-CM

## 2020-10-14 DIAGNOSIS — R748 Abnormal levels of other serum enzymes: Secondary | ICD-10-CM

## 2020-10-27 ENCOUNTER — Other Ambulatory Visit: Payer: BC Managed Care – PPO

## 2020-10-28 ENCOUNTER — Other Ambulatory Visit: Payer: Self-pay

## 2020-10-28 ENCOUNTER — Ambulatory Visit
Admission: RE | Admit: 2020-10-28 | Discharge: 2020-10-28 | Disposition: A | Discharge status: Home / Self Care | Payer: BC Managed Care – PPO | Source: Ambulatory Visit | Attending: Internal Medicine | Admitting: Internal Medicine

## 2020-10-28 DIAGNOSIS — E039 Hypothyroidism, unspecified: Secondary | ICD-10-CM

## 2020-10-28 DIAGNOSIS — R221 Localized swelling, mass and lump, neck: Secondary | ICD-10-CM

## 2020-10-28 IMAGING — CT CT NECK W/ CM
3 of 4 series · 6 of 14 positions shown, 7 images · IV contrast (iopamidol)
Comparison: [DATE] neck ultrasound

CLINICAL DATA: Hypothyroidism with mass seen by ultrasound.

EXAM:
CT NECK WITH CONTRAST
TECHNIQUE: Multidetector CT imaging of the neck was performed using the
standard protocol following the bolus administration of intravenous
contrast.
CONTRAST:  75mL [48] IOPAMIDOL ([48]) INJECTION 61%

[Series 3: neck · axial · 0.71mm/px · z∈[-252,-170]mm · 2 of 124 slices shown]
[im 42/124  bone]
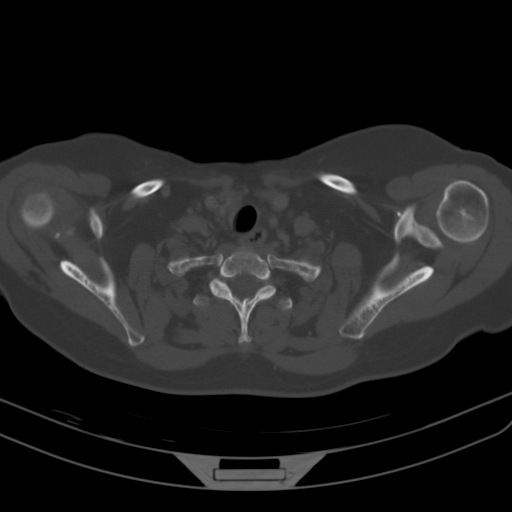
[im 83/124  bone]
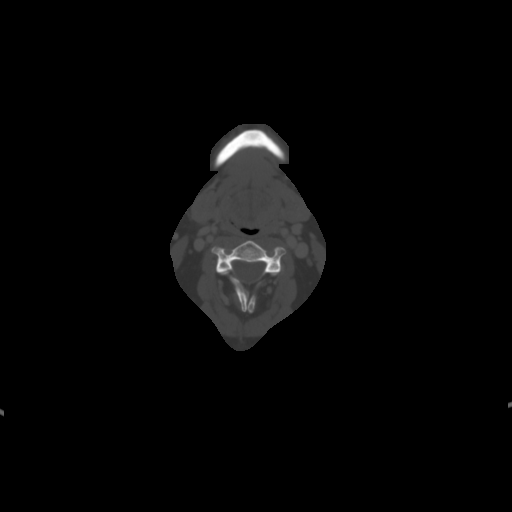

[Series 8: angled axial-oropharynx · axial · 0.39mm/px · z∈[-244,-163]mm · 2 of 124 slices shown, 3 images]
[im 42/124  soft-tissue]
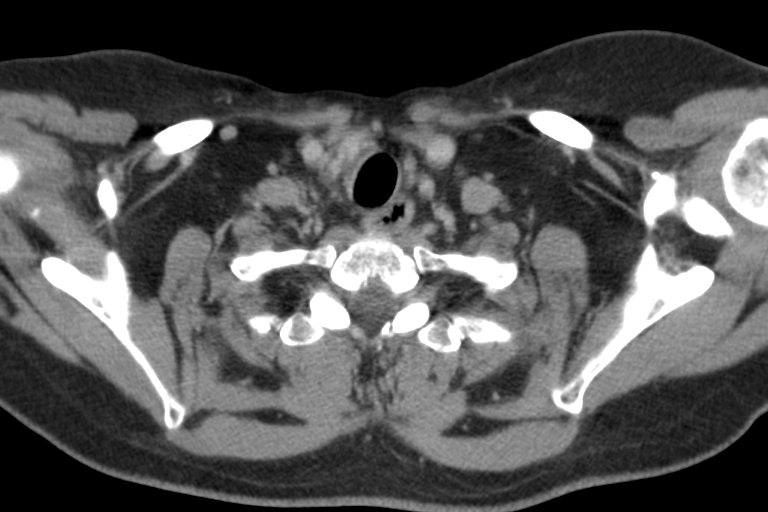
[im 42/124  bone]
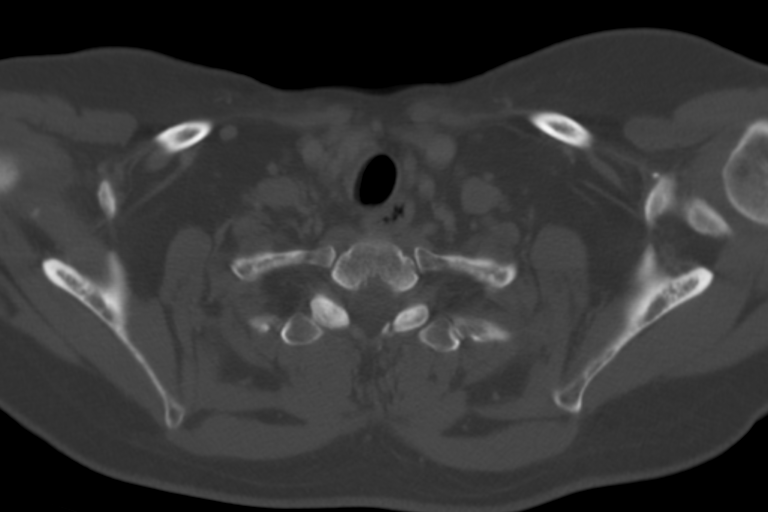
[im 83/124  bone]
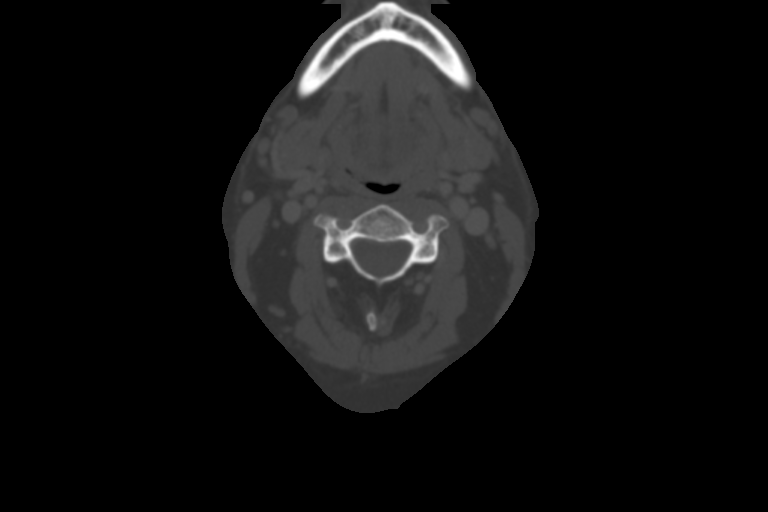

[Series 9: angled (person_name) · axial · 0.56mm/px · z∈[-216,-147]mm · 2 of 105 slices shown]
[im 35/105  bone]
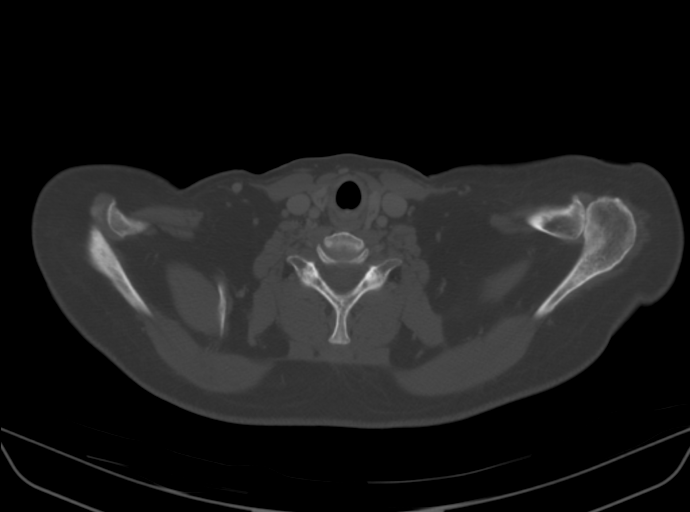
[im 70/105  bone]
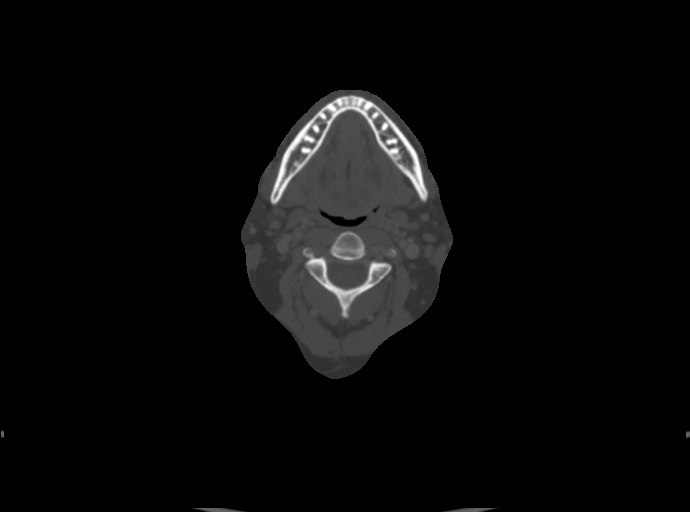

[6 of 14 positions shown; findings below may reference images not displayed]

FINDINGS: Pharynx and larynx: Normal. No mass or swelling.

Salivary glands: No inflammation, mass, or stone.

Thyroid: Normal.

Lymph nodes: None enlarged or abnormal density.

Vascular: Negative.

Limited intracranial: Negative.

Visualized orbits: Negative.

Mastoids and visualized paranasal sinuses: Clear.

Skeleton: No acute or aggressive process.

Upper chest: Negative.

Other: Palpable marker over the right supraclavicular fossa which
shows unencapsulated symmetric fatty hypertrophy. No evidence of
mass including lipoma.
IMPRESSION: Benign neck CT; right supraclavicular fullness correlates with
symmetric un-encapsulated fat.

## 2020-10-28 MED ORDER — IOPAMIDOL (ISOVUE-300) INJECTION 61%
75.0000 mL | Freq: Once | INTRAVENOUS | Status: AC | PRN
  Administered 2020-10-28: 75 mL via INTRAVENOUS

## 2020-11-09 ENCOUNTER — Other Ambulatory Visit: Payer: Self-pay | Admitting: Internal Medicine

## 2020-11-09 DIAGNOSIS — E039 Hypothyroidism, unspecified: Secondary | ICD-10-CM

## 2020-11-25 ENCOUNTER — Other Ambulatory Visit: Payer: Self-pay

## 2020-11-25 ENCOUNTER — Encounter (HOSPITAL_COMMUNITY): Payer: Self-pay | Admitting: Gastroenterology

## 2020-12-12 ENCOUNTER — Ambulatory Visit (HOSPITAL_COMMUNITY): Payer: BC Managed Care – PPO | Admitting: Anesthesiology

## 2020-12-12 ENCOUNTER — Other Ambulatory Visit: Payer: Self-pay | Admitting: Internal Medicine

## 2020-12-12 ENCOUNTER — Other Ambulatory Visit: Payer: Self-pay

## 2020-12-12 ENCOUNTER — Ambulatory Visit (HOSPITAL_COMMUNITY)
Admission: RE | Admit: 2020-12-12 | Discharge: 2020-12-12 | Disposition: A | Discharge status: Home / Self Care | Payer: BC Managed Care – PPO | Attending: Gastroenterology | Admitting: Gastroenterology

## 2020-12-12 ENCOUNTER — Encounter (HOSPITAL_COMMUNITY): Payer: Self-pay | Admitting: Gastroenterology

## 2020-12-12 ENCOUNTER — Encounter (HOSPITAL_COMMUNITY)
Admission: RE | Disposition: A | Discharge status: Home / Self Care | Payer: Self-pay | Source: Home / Self Care | Attending: Gastroenterology

## 2020-12-12 DIAGNOSIS — R748 Abnormal levels of other serum enzymes: Secondary | ICD-10-CM

## 2020-12-12 DIAGNOSIS — E119 Type 2 diabetes mellitus without complications: Secondary | ICD-10-CM | POA: Diagnosis not present

## 2020-12-12 DIAGNOSIS — I899 Noninfective disorder of lymphatic vessels and lymph nodes, unspecified: Secondary | ICD-10-CM | POA: Diagnosis not present

## 2020-12-12 DIAGNOSIS — F32A Depression, unspecified: Secondary | ICD-10-CM | POA: Diagnosis not present

## 2020-12-12 DIAGNOSIS — Z87891 Personal history of nicotine dependence: Secondary | ICD-10-CM | POA: Diagnosis not present

## 2020-12-12 DIAGNOSIS — K859 Acute pancreatitis without necrosis or infection, unspecified: Secondary | ICD-10-CM | POA: Insufficient documentation

## 2020-12-12 DIAGNOSIS — E039 Hypothyroidism, unspecified: Secondary | ICD-10-CM | POA: Insufficient documentation

## 2020-12-12 DIAGNOSIS — I10 Essential (primary) hypertension: Secondary | ICD-10-CM | POA: Diagnosis not present

## 2020-12-12 DIAGNOSIS — Z8719 Personal history of other diseases of the digestive system: Secondary | ICD-10-CM

## 2020-12-12 DIAGNOSIS — K759 Inflammatory liver disease, unspecified: Secondary | ICD-10-CM | POA: Insufficient documentation

## 2020-12-12 DIAGNOSIS — G4730 Sleep apnea, unspecified: Secondary | ICD-10-CM | POA: Insufficient documentation

## 2020-12-12 DIAGNOSIS — K2289 Other specified disease of esophagus: Secondary | ICD-10-CM | POA: Insufficient documentation

## 2020-12-12 DIAGNOSIS — K2970 Gastritis, unspecified, without bleeding: Secondary | ICD-10-CM | POA: Insufficient documentation

## 2020-12-12 DIAGNOSIS — Z7984 Long term (current) use of oral hypoglycemic drugs: Secondary | ICD-10-CM | POA: Insufficient documentation

## 2020-12-12 DIAGNOSIS — K219 Gastro-esophageal reflux disease without esophagitis: Secondary | ICD-10-CM | POA: Diagnosis not present

## 2020-12-12 DIAGNOSIS — J45909 Unspecified asthma, uncomplicated: Secondary | ICD-10-CM | POA: Insufficient documentation

## 2020-12-12 DIAGNOSIS — K449 Diaphragmatic hernia without obstruction or gangrene: Secondary | ICD-10-CM | POA: Diagnosis not present

## 2020-12-12 HISTORY — PX: ESOPHAGOGASTRODUODENOSCOPY: SHX5428

## 2020-12-12 HISTORY — PX: BIOPSY: SHX5522

## 2020-12-12 HISTORY — DX: Other complications of anesthesia, initial encounter: T88.59XA

## 2020-12-12 HISTORY — PX: EUS: SHX5427

## 2020-12-12 LAB — GLUCOSE, CAPILLARY: Glucose-Capillary: 91 mg/dL (ref 70–99)

## 2020-12-12 LAB — LIPASE, BLOOD: Lipase: 72 U/L — ABNORMAL HIGH (ref 11–51)

## 2020-12-12 SURGERY — UPPER ENDOSCOPIC ULTRASOUND (EUS) LINEAR
Anesthesia: Monitor Anesthesia Care

## 2020-12-12 MED ORDER — PROPOFOL 500 MG/50ML IV EMUL
INTRAVENOUS | Status: AC
  Filled 2020-12-12: qty 50

## 2020-12-12 MED ORDER — PROPOFOL 10 MG/ML IV BOLUS
INTRAVENOUS | Status: AC
  Filled 2020-12-12: qty 20

## 2020-12-12 MED ORDER — AMISULPRIDE (ANTIEMETIC) 5 MG/2ML IV SOLN: 10.0000 mg | Freq: Once | INTRAVENOUS | Status: DC | PRN

## 2020-12-12 MED ORDER — LACTATED RINGERS IV SOLN
INTRAVENOUS | Status: AC | PRN
  Administered 2020-12-12: 10 mL/h via INTRAVENOUS

## 2020-12-12 MED ORDER — ONDANSETRON HCL 4 MG/2ML IJ SOLN: 4.0000 mg | Freq: Once | INTRAMUSCULAR | Status: DC | PRN

## 2020-12-12 MED ORDER — SODIUM CHLORIDE 0.9 % IV SOLN: INTRAVENOUS | Status: DC

## 2020-12-12 MED ORDER — PROPOFOL 500 MG/50ML IV EMUL
INTRAVENOUS | Status: DC | PRN
  Administered 2020-12-12: 150 ug/kg/min via INTRAVENOUS

## 2020-12-12 MED ORDER — PROPOFOL 10 MG/ML IV BOLUS
INTRAVENOUS | Status: DC | PRN
  Administered 2020-12-12 (×7): 20 mg via INTRAVENOUS

## 2020-12-12 NOTE — Discharge Instructions (Signed)
YOU HAD AN ENDOSCOPIC PROCEDURE TODAY: Refer to the procedure report and other information in the discharge instructions given to you for any specific questions about what was found during the examination. If this information does not answer your questions, please call [place] office at [phone] to clarify.  ° °YOU SHOULD EXPECT: Some feelings of bloating in the abdomen. Passage of more gas than usual. Walking can help get rid of the air that was put into your GI tract during the procedure and reduce the bloating.  ° °DIET: Your first meal following the procedure should be a light meal and then it is ok to progress to your normal diet. A half-sandwich or bowl of soup is an example of a good first meal. Heavy or fried foods are harder to digest and may make you feel nauseous or bloated. Drink plenty of fluids but you should avoid alcoholic beverages for 24 hours. ° °ACTIVITY: Your care partner should take you home directly after the procedure. You should plan to take it easy, moving slowly for the rest of the day. You can resume normal activity the day after the procedure however YOU SHOULD NOT DRIVE, use power tools, machinery or perform tasks that involve climbing or major physical exertion for 24 hours (because of the sedation medicines used during the test).  ° °SYMPTOMS TO REPORT IMMEDIATELY: °A gastroenterologist can be reached at any hour. Please call [phone]  for any of the following symptoms:  ° °Following upper endoscopy (EGD, EUS, ERCP, esophageal dilation) °Vomiting of blood or coffee ground material  °New, significant abdominal pain  °New, significant chest pain or pain under the shoulder blades  °Painful or persistently difficult swallowing  °New shortness of breath  °Black, tarry-looking or red, bloody stools ° °FOLLOW UP:  °If any biopsies were taken you will be contacted by phone or by letter within the next 1-3 weeks. Call [phone]  if you have not heard about the biopsies in 3 weeks.    °Please also call with any specific questions about appointments or follow up tests. ° °

## 2020-12-12 NOTE — Anesthesia Postprocedure Evaluation (Signed)
Anesthesia Post Note  Patient: [name]  Procedure(s) Performed: UPPER ENDOSCOPIC ULTRASOUND (EUS) LINEAR ESOPHAGOGASTRODUODENOSCOPY (EGD) BIOPSY     Patient location during evaluation: PACU Anesthesia Type: MAC Level of consciousness: awake and alert Pain management: pain level controlled Vital Signs Assessment: post-procedure vital signs reviewed and stable Respiratory status: spontaneous breathing, nonlabored ventilation, respiratory function stable and patient connected to nasal cannula oxygen Cardiovascular status: stable and blood pressure returned to baseline Postop Assessment: no apparent nausea or vomiting Anesthetic complications: no   No notable events documented.  Last Vitals:  Vitals:   12/12/20 1240 12/12/20 1250  BP: 121/69 117/70  Pulse: 73 71  Resp: 14 13  Temp:    SpO2: 100% 99%    Last Pain:  Vitals:   12/12/20 1250  TempSrc:   PainSc: 0-No pain                 Krista Dawson

## 2020-12-12 NOTE — Anesthesia Preprocedure Evaluation (Signed)
Anesthesia Evaluation    Reviewed: Allergy & Precautions, Patient's Chart, lab work & pertinent test results  Airway Mallampati: II       Dental  (+) Teeth Intact, Dental Advisory Given   Pulmonary asthma , sleep apnea , former smoker,    Pulmonary exam normal        Cardiovascular hypertension, Pt. on medications  Rhythm:Regular Rate:Normal     Neuro/Psych PSYCHIATRIC DISORDERS Depression    GI/Hepatic GERD  Medicated,(+) Hepatitis -  Endo/Other  diabetes, Type 2, Oral Hypoglycemic AgentsHypothyroidism   Renal/GU      Musculoskeletal   Abdominal Normal abdominal exam  (+)   Peds  Hematology   Anesthesia Other Findings   Reproductive/Obstetrics                             Anesthesia Physical Anesthesia Plan  ASA: 2  Anesthesia Plan: MAC   Post-op Pain Management:    Induction: Intravenous  PONV Risk Score and Plan: 0 and Propofol infusion  Airway Management Planned: Natural Airway and Simple Face Mask  Additional Equipment: None  Intra-op Plan:   Post-operative Plan:   Informed Consent: I have reviewed the patients History and Physical, chart, labs and discussed the procedure including the risks, benefits and alternatives for the proposed anesthesia with the patient or authorized representative who has indicated his/her understanding and acceptance.       Plan Discussed with: CRNA  Anesthesia Plan Comments:         Anesthesia Quick Evaluation

## 2020-12-12 NOTE — Anesthesia Procedure Notes (Signed)
Procedure Name: MAC Date/Time: 12/12/2020 11:48 AM Performed by: Lollie Sails, CRNA Pre-anesthesia Checklist: Patient identified, Emergency Drugs available, Suction available, Patient being monitored and Timeout performed Oxygen Delivery Method: Simple face mask Preoxygenation: POM used. Placement Confirmation: positive ETCO2

## 2020-12-12 NOTE — Transfer of Care (Signed)
Immediate Anesthesia Transfer of Care Note  Patient: Krista Dawson  Procedure(s) Performed: UPPER ENDOSCOPIC ULTRASOUND (EUS) LINEAR ESOPHAGOGASTRODUODENOSCOPY (EGD) BIOPSY  Patient Location: PACU and Endoscopy Unit  Anesthesia Type:MAC  Level of Consciousness: awake, alert , oriented and patient cooperative  Airway & Oxygen Therapy: Patient Spontanous Breathing and Patient connected to face mask oxygen  Post-op Assessment: Report given to RN and Post -op Vital signs reviewed and stable  Post vital signs: Reviewed and stable  Last Vitals:  Vitals Value Taken Time  BP 132/106 12/12/20 1226  Temp    Pulse 77 12/12/20 1226  Resp 15 12/12/20 1226  SpO2 100 % 12/12/20 1226    Last Pain:  Vitals:   12/12/20 1114  TempSrc: Oral  PainSc: 0-No pain         Complications: No notable events documented.

## 2020-12-12 NOTE — Op Note (Signed)
[facility] Patient Name: Krista Dawson Procedure Date: 12/12/2020 MRN: [id number] Attending MD: Justice Britain , MD Date of Birth: [birth year] CSN: [id number] Age: 55 Admit Type: Outpatient Procedure:                Upper EUS Indications:              Elevated lipase, Acute pancreatitis, Abdominal                            bloating, Dyspepsia Providers:                Justice Britain, MD, Kary Kos RN, RN, [facility] Technician, Technician Referring MD:             Thornton Park MD, MD, Rayford Halsted. Isaac Bliss Medicines:                Monitored Anesthesia Care Complications:            No immediate complications. Estimated Blood Loss:     Estimated blood loss was minimal. Procedure:                Pre-Anesthesia Assessment:                           - Prior to the procedure, a History and Physical                            was performed, and patient medications and                            allergies were reviewed. The patient's tolerance of                            previous anesthesia was also reviewed. The risks                            and benefits of the procedure and the sedation                            options and risks were discussed with the patient.                            All questions were answered, and informed consent                            was obtained. Prior Anticoagulants: The patient has                            taken no previous anticoagulant or antiplatelet                            agents except for aspirin. ASA Grade Assessment:  III - A patient with severe systemic disease. After                            reviewing the risks and benefits, the patient was                            deemed in satisfactory condition to undergo the                            procedure.                           After obtaining informed consent, the endoscope was                             passed under direct vision. Throughout the                            procedure, the patient's blood pressure, pulse, and                            oxygen saturations were monitored continuously. The                            [id number]-[id number] ([id number]) Olympus endoscope was introduced                            through the mouth, and advanced to the second part                            of duodenum. The [id number]-[id number] ([id number]) Olympus                            duodenoscope was introduced through the mouth, and                            advanced to the area of papilla. The [id number]                            ([id number]) Olympus linear ultrasound scope was                            introduced through the mouth, and advanced to the                            duodenum for ultrasound examination from the                            stomach and duodenum. The upper EUS was                            accomplished without difficulty. The patient  tolerated the procedure. Scope In: Scope Out: Findings:      ENDOSCOPIC FINDING: :      No gross lesions were noted in the entire esophagus.      The Z-line was irregular and was found 35 cm from the incisors.      A 2 cm hiatal hernia was present.      Segmental moderate inflammation characterized by erosions, friability       and granularity was found in the gastric fundus and in the gastric       antrum.      No other gross lesions were noted in the entire examined stomach.       Biopsies were taken with a cold forceps for histology and Helicobacter       pylori testing.      No gross lesions were noted in the duodenal bulb, in the first portion       of the duodenum and in the second portion of the duodenum.      The major papilla was normal.      ENDOSONOGRAPHIC FINDING: :      Pancreatic parenchymal abnormalities were noted in the entire pancreas.       These consisted of lobularity without honeycombing and  hyperechoic       strands.      There was no sign of significant endosonographic abnormality in the       ducts of the pancreatic head (1.7 mm), genu of the pancreas (1.4 mm ->       1.3 mm), pancreatic body (1.0 mm) and pancreatic tail (1.1 mm). The       pancreatic duct was regular in contour.      There was no sign of significant endosonographic abnormality in the       common bile duct (2.8 mm -> 3.3 mm) and in the common hepatic duct (6.0       mm). No stones and ducts of normal caliber were identified.      There was no sign of significant endosonographic abnormality in the       ampulla. No masses were identified.      Endosonographic imaging in the visualized portion of the liver showed no       mass.      One benign-appearing lymph node was visualized in the porta hepatis       region. It measured 8 mm by 8 mm in maximal cross-sectional diameter.       The node was triangular, isoechoic and had well defined margins.      The celiac region was visualized. Impression:               EGD Impression:                           - No gross lesions in esophagus. Z-line irregular,                            35 cm from the incisors.                           - 2 cm hiatal hernia.                           - Gastritis. Biopsied.                           -  No gross lesions in the duodenal bulb, in the                            first portion of the duodenum and in the second                            portion of the duodenum.                           - Normal major papilla.                           EUS Impression:                           - Pancreatic parenchymal abnormalities consisting                            of lobularity and hyperechoic strands were noted in                            the entire pancreas. There was no sign of                            significant pathology ducts of the pancreas. These                            findings do not constitute (2 Minor criteria)                             diagnosis of Chronic Pancreatitis.                           - There was no sign of significant pathology in the                            common bile duct and in the common hepatic duct.                           - There was no sign of significant pathology in the                            ampulla.                           - One benign lymph node was visualized in the porta                            hepatis region. Tissue has not been obtained.                            However, the endosonographic appearance is  consistent with benign inflammatory changes. Moderate Sedation:      Not Applicable - Patient had care per Anesthesia. Recommendation:           - The patient will be observed post-procedure,                            until all discharge criteria are met.                           - Discharge patient to home.                           - Patient has a contact number available for                            emergencies. The signs and symptoms of potential                            delayed complications were discussed with the                            patient. Return to normal activities tomorrow.                            Written discharge instructions were provided to the                            patient.                           - Resume previous diet.                           - Observe patient's clinical course.                           - Await path results.                           - No ibuprofen, naproxen, or other non-steroidal                            anti-inflammatory drugs.                           - Draw Lipase today to follow up previous elevation.                           - Consider Fecal Elastase testing in future.                           - Query Trulicity causing elevated lipase levels.                           - Return to GI clinic with Dr. Verner Chol  Kennedy-Smith,  query potential role of repeat                            Xifaxin therapy.                           - The findings and recommendations were discussed                            with the patient.                           - The findings and recommendations were discussed                            with the patient's family. Procedure Code(s):        --- Professional ---                           [phone], Esophagogastroduodenoscopy, flexible,                            transoral; with endoscopic ultrasound examination                            limited to the esophagus, stomach or duodenum, and                            adjacent structures                           43239, Esophagogastroduodenoscopy, flexible,                            transoral; with biopsy, single or multiple Diagnosis Code(s):        --- Professional ---                           K22.8, Other specified diseases of esophagus                           K44.9, Diaphragmatic hernia without obstruction or                            gangrene                           K29.70, Gastritis, unspecified, without bleeding                           K86.9, Disease of pancreas, unspecified                           I89.9, Noninfective disorder of lymphatic vessels                            and lymph nodes, unspecified  R74.8, Abnormal levels of other serum enzymes                           K85.90, Acute pancreatitis without necrosis or                            infection, unspecified                           R14.0, Abdominal distension (gaseous)                           R10.13, Epigastric pain CPT copyright 2019 [employer]. All rights reserved. The codes documented in this report are preliminary and upon coder review may  be revised to meet current compliance requirements. Justice Britain, MD 12/12/2020 12:34:16 PM Number of Addenda: 0

## 2020-12-12 NOTE — H&P (Signed)
GASTROENTEROLOGY PROCEDURE H&P NOTE   Primary Care Physician: Isaac Bliss, Rayford Halsted, MD  HPI: Krista Dawson is a 55 y.o. female who presents for EGD/EUS for evaluation of possible pancreatitis while on Trulicity as well as persistent lipase elevation.  Goal to rule out microcholedocholithiasis and any other significant abnormalities that could be suggestive of autoimmune pancreatitis.  Family history of pancreas cancer in uncle but does not meet criteria for high rescreening.  Pancreas itself normal in appearance on MRI/MRCP with some mild bile duct dilation status post cholecystectomy.  Past Medical History:  Diagnosis Date   Allergy    Asthma    exercise induced - not used inhaler couple of yrs pt reported 10-03-2018   Blood transfusion without reported diagnosis    Colon polyp    Complication of anesthesia    went into Vtach after breast reduction surgery 1988/89   Depression    DM (diabetes mellitus) ([place])    GERD (gastroesophageal reflux disease)    HTN (hypertension)    Hyperlipidemia    Hypothyroidism    NASH (nonalcoholic steatohepatitis)    Obesity (BMI 30.0-34.9)    Post menopausal problems    Sleep apnea    no c-pap   Transaminitis    Vitamin D deficiency    Past Surgical History:  Procedure Laterality Date   ADRENALECTOMY Right    APPENDECTOMY     BREAST REDUCTION SURGERY     CHOLECYSTECTOMY     HYSTERECTOMY ABDOMINAL WITH SALPINGECTOMY     LIVER BIOPSY  2008, 2013   NASH   TONSILLECTOMY     UPPER GASTROINTESTINAL ENDOSCOPY     No current facility-administered medications for this encounter.   No current facility-administered medications for this encounter. Allergies  Allergen Reactions   Spironolactone Hives and Swelling   Paxil [Paroxetine Hcl] Other (See Comments)    Other reaction(s): Other (comments) WORSENED DEPRESSION. WORSENED DEPRESSION.    Codeine Itching and Rash   Elemental Sulfur Rash   Family History  Problem Relation  Age of Onset   Breast cancer Mother    Colon polyps Mother    Stroke Mother 3   GER disease Father    Diabetes Father    Kidney disease Father    CAD Father    Other Father        cause of death listed as resp. failure   Colon polyps Sister    Stroke Sister    Diabetes Maternal Grandfather    Heart disease Maternal Grandfather    Diabetes Paternal Grandmother    CAD Paternal Grandfather    Pancreatic cancer Paternal Uncle    Pancreatic cancer Maternal Uncle    Colon cancer Neg Hx    Esophageal cancer Neg Hx    Stomach cancer Neg Hx    Rectal cancer Neg Hx    Social History   Socioeconomic History   Marital status: Single    Spouse name: Not on file   Number of children: 0   Years of education: Not on file   Highest education level: Not on file  Occupational History   Occupation: Scientist, water quality  Tobacco Use   Smoking status: Former    Types: Cigarettes    Quit date: 2009    Years since quitting: 13.9   Smokeless tobacco: Never   Tobacco comments:    quit early 2000 per pt  Vaping Use   Vaping Use: Never used  Substance and Sexual Activity   Alcohol use: Yes  Comment: occasional   Drug use: Never   Sexual activity: Not Currently    Birth control/protection: Surgical    Comment: post menopausal  Other Topics Concern   Not on file  Social History Narrative   Not on file   Social Determinants of Health   Financial Resource Strain: Not on file  Food Insecurity: Not on file  Transportation Needs: Not on file  Physical Activity: Not on file  Stress: Not on file  Social Connections: Not on file  Intimate Partner Violence: Not on file    Physical Exam: Today's Vitals   11/25/20 1351  Weight: 89.4 kg  Height: 5' 5"  (1.651 m)   Body mass index is 32.78 kg/m. GEN: NAD EYE: Sclerae anicteric ENT: MMM CV: Non-tachycardic GI: Soft, NT/ND NEURO:  Alert & Oriented x 3  Lab Results: No results for input(s): WBC, HGB, HCT, PLT in the last 72  hours. BMET No results for input(s): NA, K, CL, CO2, GLUCOSE, BUN, CREATININE, CALCIUM in the last 72 hours. LFT No results for input(s): PROT, ALBUMIN, AST, ALT, ALKPHOS, BILITOT, BILIDIR, IBILI in the last 72 hours. PT/INR No results for input(s): LABPROT, INR in the last 72 hours.   Impression / Plan: This is a 55 y.o.female who presents for EGD/EUS for evaluation of possible pancreatitis while on Trulicity as well as persistent lipase elevation.  Goal to rule out microcholedocholithiasis and any other significant abnormalities that could be suggestive of autoimmune pancreatitis.  Family history of pancreas cancer in uncle but does not meet criteria for high rescreening.  Pancreas itself normal in appearance on MRI/MRCP with some mild bile duct dilation status post cholecystectomy.  The risks of an EUS including intestinal perforation, bleeding, infection, aspiration, and medication effects were discussed as was the possibility it may not give a definitive diagnosis if a biopsy is performed.  When a biopsy of the pancreas is done as part of the EUS, there is an additional risk of pancreatitis at the rate of about 1-2%.  It was explained that procedure related pancreatitis is typically mild, although it can be severe and even life threatening, which is why we do not perform random pancreatic biopsies and only biopsy a lesion/area we feel is concerning enough to warrant the risk.  The risks and benefits of endoscopic evaluation/treatment were discussed with the patient and/or family; these include but are not limited to the risk of perforation, infection, bleeding, missed lesions, lack of diagnosis, severe illness requiring hospitalization, as well as anesthesia and sedation related illnesses.  The patient's history has been reviewed, patient examined, no change in status, and deemed stable for procedure.  The patient and/or family is agreeable to proceed.    Justice Britain, MD [facility]  Gastroenterology Advanced Endoscopy Office # [phone]

## 2020-12-13 ENCOUNTER — Encounter: Payer: Self-pay | Admitting: Gastroenterology

## 2020-12-13 ENCOUNTER — Encounter (HOSPITAL_COMMUNITY): Payer: Self-pay | Admitting: Gastroenterology

## 2020-12-13 LAB — SURGICAL PATHOLOGY

## 2020-12-23 ENCOUNTER — Ambulatory Visit (INDEPENDENT_AMBULATORY_CARE_PROVIDER_SITE_OTHER): Payer: BC Managed Care – PPO | Admitting: Internal Medicine

## 2020-12-23 ENCOUNTER — Encounter: Payer: Self-pay | Admitting: Internal Medicine

## 2020-12-23 VITALS — BP 120/78 | HR 70 | Temp 98.0°F | Ht 65.0 in | Wt 201.8 lb

## 2020-12-23 DIAGNOSIS — Z0000 Encounter for general adult medical examination without abnormal findings: Secondary | ICD-10-CM

## 2020-12-23 DIAGNOSIS — I10 Essential (primary) hypertension: Secondary | ICD-10-CM

## 2020-12-23 DIAGNOSIS — E039 Hypothyroidism, unspecified: Secondary | ICD-10-CM | POA: Diagnosis not present

## 2020-12-23 DIAGNOSIS — E785 Hyperlipidemia, unspecified: Secondary | ICD-10-CM | POA: Diagnosis not present

## 2020-12-23 DIAGNOSIS — E119 Type 2 diabetes mellitus without complications: Secondary | ICD-10-CM | POA: Diagnosis not present

## 2020-12-23 DIAGNOSIS — K7581 Nonalcoholic steatohepatitis (NASH): Secondary | ICD-10-CM

## 2020-12-23 LAB — COMPREHENSIVE METABOLIC PANEL
ALT: 36 U/L — ABNORMAL HIGH (ref 0–35)
AST: 21 U/L (ref 0–37)
Albumin: 4.4 g/dL (ref 3.5–5.2)
Alkaline Phosphatase: 67 U/L (ref 39–117)
BUN: 19 mg/dL (ref 6–23)
CO2: 26 mEq/L (ref 19–32)
Calcium: 10.2 mg/dL (ref 8.4–10.5)
Chloride: 107 mEq/L (ref 96–112)
Creatinine, Ser: 0.87 mg/dL (ref 0.40–1.20)
GFR: 75.99 mL/min (ref >60.00)
Glucose, Bld: 111 mg/dL — ABNORMAL HIGH (ref 70–99)
Potassium: 4.9 mEq/L (ref 3.5–5.1)
Sodium: 140 mEq/L (ref 135–145)
Total Bilirubin: 0.4 mg/dL (ref 0.2–1.2)
Total Protein: 6.8 g/dL (ref 6.0–8.3)

## 2020-12-23 LAB — CBC WITH DIFFERENTIAL/PLATELET
Basophils Absolute: 0 10*3/uL (ref 0.0–0.1)
Basophils Relative: 0.5 % (ref 0.0–3.0)
Eosinophils Absolute: 0.4 10*3/uL (ref 0.0–0.7)
Eosinophils Relative: 4.5 % (ref 0.0–5.0)
HCT: 40.3 % (ref 36.0–46.0)
Hemoglobin: 12.8 g/dL (ref 12.0–15.0)
Lymphocytes Relative: 32.5 % (ref 12.0–46.0)
Lymphs Abs: 2.6 10*3/uL (ref 0.7–4.0)
MCHC: 31.7 g/dL (ref 30.0–36.0)
MCV: 85.6 fl (ref 78.0–100.0)
Monocytes Absolute: 0.4 10*3/uL (ref 0.1–1.0)
Monocytes Relative: 5.3 % (ref 3.0–12.0)
Neutro Abs: 4.6 10*3/uL (ref 1.4–7.7)
Neutrophils Relative %: 57.2 % (ref 43.0–77.0)
Platelets: 273 10*3/uL (ref 150.0–400.0)
RBC: 4.7 Mil/uL (ref 3.87–5.11)
RDW: 14.9 % (ref 11.5–15.5)
WBC: 7.9 10*3/uL (ref 4.0–10.5)

## 2020-12-23 LAB — VITAMIN D 25 HYDROXY (VIT D DEFICIENCY, FRACTURES): VITD: 40.56 ng/mL (ref 30.00–100.00)

## 2020-12-23 LAB — LIPID PANEL
Cholesterol: 129 mg/dL (ref 0–200)
HDL: 45.1 mg/dL (ref >39.00)
LDL Cholesterol: 60 mg/dL (ref 0–99)
NonHDL: 83.8
Total CHOL/HDL Ratio: 3
Triglycerides: 121 mg/dL (ref 0.0–149.0)
VLDL: 24.2 mg/dL (ref 0.0–40.0)

## 2020-12-23 LAB — TSH: TSH: 0.58 u[IU]/mL (ref 0.35–5.50)

## 2020-12-23 LAB — VITAMIN B12: Vitamin B-12: 263 pg/mL (ref 211–911)

## 2020-12-23 LAB — HEMOGLOBIN A1C: Hgb A1c MFr Bld: 6.6 % — ABNORMAL HIGH (ref 4.6–6.5)

## 2020-12-23 NOTE — Progress Notes (Signed)
Established Patient Office Visit     This visit occurred during the SARS-CoV-2 public health emergency.  Safety protocols were in place, including screening questions prior to the visit, additional usage of staff PPE, and extensive cleaning of exam room while observing appropriate contact time as indicated for disinfecting solutions.    CC/Reason for Visit: Annual preventive exam  HPI: Krista Dawson is a 55 y.o. female who is coming in today for the above mentioned reasons. Past Medical History is significant for: Hypertension, hyperlipidemia, hypothyroidism, type 2 diabetes, nonalcoholic steatohepatitis.  For the last few months she has been dealing with a lot of GI symptoms, acid reflux.  She also had a persistently elevated lipase and ended up having an endoscopic ultrasound.  There were signs of significant gastritis.  She has improved on PPI therapy and will be seeing GI again in January.  She has routine eye and dental care.  She is overdue for Tdap and COVID booster.  Colonoscopy in 2020, mammogram September 2020.,  Pap smear March 21.   Past Medical/Surgical History: Past Medical History:  Diagnosis Date   Allergy    Asthma    exercise induced - not used inhaler couple of yrs pt reported 10-03-2018   Blood transfusion without reported diagnosis    Colon polyp    Complication of anesthesia    went into Vtach after breast reduction surgery 1988/89   Depression    DM (diabetes mellitus) ([place])    GERD (gastroesophageal reflux disease)    HTN (hypertension)    Hyperlipidemia    Hypothyroidism    NASH (nonalcoholic steatohepatitis)    Obesity (BMI 30.0-34.9)    Post menopausal problems    Sleep apnea    no c-pap   Transaminitis    Vitamin D deficiency     Past Surgical History:  Procedure Laterality Date   ADRENALECTOMY Right    APPENDECTOMY     BIOPSY  12/12/2020   Procedure: BIOPSY;  Surgeon: Rush Landmark Telford Nab., MD;  Location: [facility];  Service:  Gastroenterology;;   BREAST REDUCTION SURGERY     CHOLECYSTECTOMY     ESOPHAGOGASTRODUODENOSCOPY N/A 12/12/2020   Procedure: ESOPHAGOGASTRODUODENOSCOPY (EGD);  Surgeon: Irving Copas., MD;  Location: [facility];  Service: Gastroenterology;  Laterality: N/A;   EUS N/A 12/12/2020   Procedure: UPPER ENDOSCOPIC ULTRASOUND (EUS) LINEAR;  Surgeon: Irving Copas., MD;  Location: [facility];  Service: Gastroenterology;  Laterality: N/A;   HYSTERECTOMY ABDOMINAL WITH SALPINGECTOMY     LIVER BIOPSY  2008, 2013   NASH   TONSILLECTOMY     UPPER GASTROINTESTINAL ENDOSCOPY      Social History:  reports that she quit smoking about 13 years ago. Her smoking use included cigarettes. She has never used smokeless tobacco. She reports current alcohol use. She reports that she does not use drugs.  Allergies: Allergies  Allergen Reactions   Spironolactone Hives and Swelling   Paxil [Paroxetine Hcl] Other (See Comments)    Other reaction(s): Other (comments) WORSENED DEPRESSION. WORSENED DEPRESSION.    Sulfa Antibiotics Hives and Itching   Codeine Itching and Rash    Family History:  Family History  Problem Relation Age of Onset   Breast cancer Mother    Colon polyps Mother    Stroke Mother 73   GER disease Father    Diabetes Father    Kidney disease Father    CAD Father    Other Father        cause of  death listed as resp. failure   Colon polyps Sister    Stroke Sister    Diabetes Maternal Grandfather    Heart disease Maternal Grandfather    Diabetes Paternal Grandmother    CAD Paternal Grandfather    Pancreatic cancer Paternal Uncle    Pancreatic cancer Maternal Uncle    Colon cancer Neg Hx    Esophageal cancer Neg Hx    Stomach cancer Neg Hx    Rectal cancer Neg Hx      Current Outpatient Medications:    aspirin EC 81 MG tablet, Take 81 mg by mouth every evening., Disp: , Rfl:    Blood Glucose Monitoring Suppl (CONTOUR NEXT ONE) KIT, by Does not apply  route., Disp: , Rfl:    Calcium-Magnesium (CAL-MAG PO), Take by mouth., Disp: , Rfl:    Cholecalciferol (VITAMIN D3) 50 MCG (2000 UT) TABS, Take 2,000 Units by mouth in the morning., Disp: , Rfl:    hydrochlorothiazide (HYDRODIURIL) 25 MG tablet, TAKE ONE TABLET BY MOUTH DAILY, Disp: 90 tablet, Rfl: 1   levothyroxine (SYNTHROID) 88 MCG tablet, TAKE 1 TABLET BY MOUTH DAILY BEFORE BREAKFAST, Disp: 90 tablet, Rfl: 1   lisinopril (ZESTRIL) 20 MG tablet, TAKE 1 TABLET(20 MG TOTAL) BY MOUTH DAILY, Disp: 90 tablet, Rfl: 1   Melatonin 10 MG CAPS, Take 10 mg by mouth at bedtime., Disp: , Rfl:    metFORMIN (GLUCOPHAGE) 500 MG tablet, TAKE TWO TABLETS BY MOUTH TWO TIMES DAILY. GENERIC EQUIVALENT FOR GLUCOPHAGE., Disp: 360 tablet, Rfl: 1   Microlet Lancets MISC, by Does not apply route. Test 3-4 times daily (Micorlet Colored Lancets), Disp: , Rfl:    pantoprazole (PROTONIX) 40 MG tablet, Take 1 tablet (40 mg total) by mouth daily. Take 30 minutes before breakfast., Disp: 90 tablet, Rfl: 3   PREBIOTIC PRODUCT PO, Take 1 Scoop by mouth daily. Poly-Prebiotic powder, Disp: , Rfl:    rosuvastatin (CRESTOR) 10 MG tablet, TAKE 1 TABLET(10 MG TOTAL) BY MOUTH DAILY. (Patient taking differently: Take 10 mg by mouth every evening.), Disp: 90 tablet, Rfl: 1   venlafaxine XR (EFFEXOR-XR) 37.5 MG 24 hr capsule, TAKE ONE CAPSULE BY MOUTH DAILY. PLEASE SCHEDULE A PHYSICAL FOR MORE REFILLS., Disp: 90 capsule, Rfl: 0   Digestive Enzymes (DIGESTIVE ENZYME PO), Take 1 capsule by mouth daily. Bilex by Georgette Dover (Patient not taking: Reported on 12/23/2020), Disp: , Rfl:    GARLIC PO, Take 1 capsule by mouth daily. (Patient not taking: Reported on 12/23/2020), Disp: , Rfl:    L-glutamine (ENDARI) 5 g PACK Powder Packet, Take 5 g by mouth in the morning. (Patient not taking: Reported on 12/23/2020), Disp: , Rfl:    Omega-3 Fatty Acids (FISH OIL) 1000 MG CAPS, Take 1 capsule by mouth daily. (Patient not taking: Reported on  12/23/2020), Disp: , Rfl:    OREGANO PO, Take 1 capsule by mouth daily. (Patient not taking: Reported on 12/23/2020), Disp: , Rfl:    Probiotic Product (PROBIOTIC DAILY PO), Take 1 capsule by mouth daily. [employer] MegaSporeBiotic (Patient not taking: Reported on 12/23/2020), Disp: , Rfl:   Review of Systems:  Constitutional: Denies fever, chills, diaphoresis, appetite change and fatigue.  HEENT: Denies photophobia, eye pain, redness, hearing loss, ear pain, congestion, sore throat, rhinorrhea, sneezing, mouth sores, trouble swallowing, neck pain, neck stiffness and tinnitus.   Respiratory: Denies SOB, DOE, cough, chest tightness,  and wheezing.   Cardiovascular: Denies chest pain, palpitations and leg swelling.  Gastrointestinal: Denies  diarrhea, constipation, blood  in stool and abdominal distention.  Genitourinary: Denies dysuria, urgency, frequency, hematuria, flank pain and difficulty urinating.  Endocrine: Denies: hot or cold intolerance, sweats, changes in hair or nails, polyuria, polydipsia. Musculoskeletal: Denies myalgias, back pain, joint swelling, arthralgias and gait problem.  Skin: Denies pallor, rash and wound.  Neurological: Denies dizziness, seizures, syncope, weakness, light-headedness, numbness and headaches.  Hematological: Denies adenopathy. Easy bruising, personal or family bleeding history  Psychiatric/Behavioral: Denies suicidal ideation, mood changes, confusion, nervousness, sleep disturbance and agitation    Physical Exam: Vitals:   12/23/20 0904  BP: 120/78  Pulse: 70  Temp: 98 F (36.7 C)  TempSrc: Oral  SpO2: 99%  Weight: 201 lb 12.8 oz (91.5 kg)  Height: _0  (1.651 m)    Body mass index is 33.58 kg/m.   Constitutional: NAD, calm, comfortable Eyes: PERRL, lids and conjunctivae normal, wears corrective lenses ENMT: Mucous membranes are moist. Posterior pharynx clear of any exudate or lesions. Normal dentition. Tympanic membrane is pearly  white, no erythema or bulging. Neck: normal, supple, no masses, no thyromegaly Respiratory: clear to auscultation bilaterally, no wheezing, no crackles. Normal respiratory effort. No accessory muscle use.  Cardiovascular: Regular rate and rhythm, no murmurs / rubs / gallops. No extremity edema. 2+ pedal pulses. No carotid bruits.  Abdomen: no tenderness, no masses palpated. No hepatosplenomegaly. Bowel sounds positive.  Musculoskeletal: no clubbing / cyanosis. No joint deformity upper and lower extremities. Good ROM, no contractures. Normal muscle tone.  Skin: no rashes, lesions, ulcers. No induration Neurologic: CN 2-12 grossly intact. Sensation intact, DTR normal. Strength 5/5 in all 4.  Psychiatric: Normal judgment and insight. Alert and oriented x 3. Normal mood.    Impression and Plan:  Encounter for preventive health examination -Recommend routine eye and dental care. -Immunizations: Overdue for Tdap, and COVID booster.  She declines administration today -Healthy lifestyle discussed in detail. -Labs to be updated today. -Colon cancer screening: 09/2018, 5-year callback -Breast cancer screening: 09/2020 -Cervical cancer screening: 03/2020 -Lung cancer screening: Not applicable -Prostate cancer screening: Not applicable -DEXA: Not applicable  Hyperlipidemia, unspecified hyperlipidemia type  - Plan: Lipid panel  Primary hypertension  -Blood pressure is well controlled.  Hypothyroidism, unspecified type  - Plan: TSH -Continue levothyroxine supplementation.  Type 2 diabetes mellitus without complication, without long-term current use of insulin ([place])  - Plan: Hemoglobin A1c -As an experiment, we will discontinue Trulicity until her next visit in 3 months to see if this could have any bearing on her persistent GI symptoms and elevated lipase.  NASH (nonalcoholic steatohepatitis) -Recheck LFTs today.    Patient Instructions  -Nice seeing you today!!  -Lab work today; will  notify you once results are available.  -Remember your COVID booster and tdap.  -Stop Trulicity to see if it helps with your GI symptoms.  -Schedule follow up in 3 months.     Lelon Frohlich, MD [facility]

## 2020-12-23 NOTE — Patient Instructions (Signed)
-  Nice seeing you today!!  -Lab work today; will notify you once results are available.  -Remember your COVID booster and tdap.  -Stop Trulicity to see if it helps with your GI symptoms.  -Schedule follow up in 3 months.

## 2021-01-03 ENCOUNTER — Telehealth: Payer: Self-pay | Admitting: Internal Medicine

## 2021-01-03 DIAGNOSIS — E039 Hypothyroidism, unspecified: Secondary | ICD-10-CM

## 2021-01-03 MED ORDER — HYDROCHLOROTHIAZIDE 25 MG PO TABS: ORAL_TABLET | ORAL | 0 refills | Status: DC

## 2021-01-03 MED ORDER — LISINOPRIL 20 MG PO TABS: ORAL_TABLET | ORAL | 0 refills | Status: DC

## 2021-01-03 MED ORDER — LEVOTHYROXINE SODIUM 88 MCG PO TABS: 88.0000 ug | ORAL_TABLET | Freq: Every day | ORAL | 0 refills | Status: DC

## 2021-01-03 MED ORDER — VENLAFAXINE HCL ER 37.5 MG PO CP24: ORAL_CAPSULE | ORAL | 0 refills | Status: DC

## 2021-01-03 MED ORDER — METFORMIN HCL 500 MG PO TABS: ORAL_TABLET | ORAL | 0 refills | Status: DC

## 2021-01-03 MED ORDER — ROSUVASTATIN CALCIUM 10 MG PO TABS: 10.0000 mg | ORAL_TABLET | Freq: Every day | ORAL | 0 refills | Status: DC

## 2021-01-03 NOTE — Telephone Encounter (Signed)
Patient called because she is stuck in [place] due to weather and she will need a couple days worth of tablets sent in as she runs out of all of them today. She will need it to last her until Monday   hydrochlorothiazide (HYDRODIURIL) 25 MG tablet levothyroxine (SYNTHROID) 88 MCG tablet lisinopril (ZESTRIL) 20 MG tablet metFORMIN (GLUCOPHAGE) 500 MG tablet pantoprazole (PROTONIX) 40 MG tablet rosuvastatin (CRESTOR) 10 MG tablet venlafaxine XR (EFFEXOR-XR) 37.5 MG 24 hr capsule    Please send to [facility]  [place], [place] [address]      Please advise

## 2021-01-03 NOTE — Telephone Encounter (Signed)
Rx done with the exception of Pantoprazole as this was not prescribed by PCP.

## 2021-01-24 ENCOUNTER — Other Ambulatory Visit (INDEPENDENT_AMBULATORY_CARE_PROVIDER_SITE_OTHER): Payer: BC Managed Care – PPO

## 2021-01-24 ENCOUNTER — Encounter: Payer: Self-pay | Admitting: Gastroenterology

## 2021-01-24 ENCOUNTER — Ambulatory Visit (INDEPENDENT_AMBULATORY_CARE_PROVIDER_SITE_OTHER): Payer: BC Managed Care – PPO | Admitting: Gastroenterology

## 2021-01-24 VITALS — BP 120/80 | HR 88 | Ht 65.0 in | Wt 204.4 lb

## 2021-01-24 DIAGNOSIS — R140 Abdominal distension (gaseous): Secondary | ICD-10-CM

## 2021-01-24 DIAGNOSIS — R748 Abnormal levels of other serum enzymes: Secondary | ICD-10-CM

## 2021-01-24 DIAGNOSIS — R1010 Upper abdominal pain, unspecified: Secondary | ICD-10-CM

## 2021-01-24 DIAGNOSIS — Z8719 Personal history of other diseases of the digestive system: Secondary | ICD-10-CM | POA: Diagnosis not present

## 2021-01-24 LAB — LIPASE: Lipase: 122 U/L — ABNORMAL HIGH (ref 11.0–59.0)

## 2021-01-24 NOTE — Patient Instructions (Addendum)
It was a pleasure to see you today. I am so glad that you are feeling better off the Trulicity.  With weight loss, you may be able to taper off your pantoprazole. When we are in a place that that seems like a good idea, you could reduce the pantoprazole to every other day for a week before stopping it all together. Good luck with your new exercise routine.   I recommend a lipase today as I would expect it to be normal.  You will be due a colonoscopy in 2025, although we would consider an earlier procedure with any new symptosm prior to that time.   I'd like to see you at least annually if you are continuing to take pantoprazole.   I appreciate the  opportunity to care for you  Thank You   Santo Held

## 2021-01-24 NOTE — Progress Notes (Signed)
Referring Provider: Isaac Bliss, [facility]* Primary Care Physician:  Isaac Bliss, Rayford Halsted, MD  Chief complaint: Abdominal pain and bloating  IMPRESSION:  Elevated lipase     - ? Acute pancreatitis 07/2020    - normal EUS 12/12/20 Suspected SIBO    - presented with bloating, eructation, epigastric fullness, nausea and early satiety    - symptoms resolved with Xifaxan 550 mg TID x 14 days Retained food noted at time of EGD    - follow-up gastric emptying scan negative Reflux and gastritis on EGD 1/0/31 Nonalcoholic fatty liver disease with abnormal transaminases    - liver biopsy in 2007 showed portal fibrosis    - liver biopsy in 2011 showed resolution of ballooning and steatosis    - FibroScan 06/21/2016 showed a median E6.7, CAP 325       - indicating minimal fibrosis and moderate steatosis.    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 Will repeat lipase today. No additional evaluation if normal.   History of suspected SIBO: Symptoms initially  resolved with Xifaxan 550 mg TID for 14 days. Continue to use dicyclomine PRN. Consider repeat antibiotics with recurrent symptoms.   Reflux and gastritis on recent EGD:  Continue pantoprazole 40 mg QD. Hopefully, her GI symptoms will improve with additional weight loss.   Fatty liver: Plan restaging within the next year given her concurrent diabetes.   Family history of polyps: No precancerous polyps on colonoscopy 09/2018. Surveillance colonoscopy recommended in 2025 given the family history.    PLAN: - Continue pantoprazole 40 mg QD (one year refill today) -  Work to maintain a healthy weight and maximize control of diabetes and hyperlipidemia - Lipase level today - Colonoscopy 2025, earlier with new symptoms - Follow-up in the office as needed, at least annually - Restage liver disease at the time of next follow-up   HPI: Krista Dawson is a 56 y.o. female who returns in follow-up.   I initially met her for screening colonoscopy 10/03/2018.  A small hyperplastic polyp was removed at that time. Surveillance colonoscopy recommended in 5 years given her family history.  Referred in 2021 for fatty liver and elevated liver enzymes. Abdominal ultrasound 02/23/19 to follow-up on the increase in her liver enzymes showed increased liver echogenicty likely reflecting steatosis. At the time of that consultation, she reported symptoms of dyspepsia.  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  Esophageal biopsies showed reflux.  Gastric biopsy showed mild reactive gastropathy and mild chronic gastritis.  There was no H. pylori.  Duodenal biopsies were normal.  Follow-up gastric emptying scan 03/27/30 to evaluate for gastroparesis given the retained food was normal.  She was successfully treated with 14 days of Xifaxan and dicyclomine in April 2021 for post-prandial bloating, abdominal pressure and nausea. Bowel habits returned to normal after treatment.   Had an urgent work-in appointment in July 2022 with Carl Best, NP for recurrent symptoms. Switch from omeprazole to pantoprazole provided relief within a few days without need for repeat Xifaxan. Labs showed an elevated lipase at 101. CBC and liver enzymes  were normal. MRI/MRCP 08/03/20 showed no acute findings. Pancreas was normal. Fatty liver. Prior cholecystecytomy.   She has regained the weight she initially lost during the most severe of the symptoms.   Gastric mapping through chiropractor Eugenia Pancoast) identified  gluten sensitivity. She was started on multiple supplements and feels that this is also improving her symptoms.   Upper EUS 12/12/2020 with Dr. Rush Landmark showed a 2 cm hiatal hernia, irregular Z-line, and gastritis.  Pancreatic parenchymal abnormalities consisting of lobularity and hyperechoic strands were noted in the entire pancreas.  There was no sign of significant pathology in the ducts of the pancreas.  She did not meet criteria for a diagnosis of chronic pancreatitis.  Bile ducts and ampulla otherwise appeared normal.  Benign lymph nodes consistent with inflammatory changes seen.  Gastric biopsy showed erosive gastritis.  There was no H. pylori or intestinal metaplasia.  6 months ago the lipase was 101, 3 months ago the lipase was 121, 1 month ago the lipase was 72.  Returns today in scheduled follow-up.  Symptoms have been much better since she stopped taking Trulicity. Her bloating and abdominal pain have resolved. She does not feel that additional evaluation or treatment is needed.   She is working on weight loss. She is playing tennis and is going to start working out a gym at work.     Past Medical History:  Diagnosis Date   Allergy    Asthma    exercise induced - not used inhaler couple of yrs pt reported 10-03-2018   Blood transfusion without reported diagnosis    Colon polyp    Complication of anesthesia    went into Vtach after breast reduction surgery 1988/89   Depression    DM (diabetes mellitus) ([place])    GERD (gastroesophageal reflux disease)    HTN (hypertension)    Hyperlipidemia    Hypothyroidism    NASH (nonalcoholic steatohepatitis)    Obesity (BMI 30.0-34.9)    Post menopausal problems    Sleep apnea    no c-pap   Transaminitis    Vitamin D deficiency     Past Surgical History:  Procedure Laterality Date   ADRENALECTOMY Right    APPENDECTOMY     BIOPSY  12/12/2020   Procedure: BIOPSY;  Surgeon: Rush Landmark Telford Nab., MD;  Location: [facility];   Service: Gastroenterology;;   BREAST REDUCTION SURGERY     CHOLECYSTECTOMY     ESOPHAGOGASTRODUODENOSCOPY N/A 12/12/2020   Procedure: ESOPHAGOGASTRODUODENOSCOPY (EGD);  Surgeon: Irving Copas., MD;  Location: [facility];  Service: Gastroenterology;  Laterality: N/A;   EUS N/A 12/12/2020   Procedure: UPPER ENDOSCOPIC ULTRASOUND (EUS) LINEAR;  Surgeon: Irving Copas., MD;  Location: [facility];  Service: Gastroenterology;  Laterality: N/A;   HYSTERECTOMY ABDOMINAL WITH SALPINGECTOMY     LIVER BIOPSY  2008, 2013   NASH   TONSILLECTOMY     UPPER GASTROINTESTINAL ENDOSCOPY      Current Outpatient Medications  Medication Sig Dispense Refill   aspirin EC 81 MG tablet Take 81 mg by mouth every evening.     Blood Glucose Monitoring Suppl (CONTOUR NEXT ONE) KIT by Does not apply route.     Calcium-Magnesium (CAL-MAG PO) Take by mouth.     Cholecalciferol (VITAMIN D3) 50 MCG (2000 UT) TABS Take 2,000 Units by mouth in the morning.     GARLIC PO Take 1 capsule by mouth daily.     hydrochlorothiazide (HYDRODIURIL) 25 MG tablet TAKE ONE TABLET BY MOUTH DAILY 10  tablet 0   levothyroxine (SYNTHROID) 88 MCG tablet Take 1 tablet (88 mcg total) by mouth daily before breakfast. 10 tablet 0   lisinopril (ZESTRIL) 20 MG tablet TAKE 1 TABLET(20 MG TOTAL) BY MOUTH DAILY 10 tablet 0   Melatonin 10 MG CAPS Take 10 mg by mouth at bedtime.     metFORMIN (GLUCOPHAGE) 500 MG tablet TAKE TWO TABLETS BY MOUTH TWO TIMES DAILY. GENERIC EQUIVALENT FOR GLUCOPHAGE. 40 tablet 0   Microlet Lancets MISC by Does not apply route. Test 3-4 times daily (Micorlet Colored Lancets)     Omega-3 Fatty Acids (FISH OIL) 1000 MG CAPS Take 1 capsule by mouth daily.     pantoprazole (PROTONIX) 40 MG tablet Take 1 tablet (40 mg total) by mouth daily. Take 30 minutes before breakfast. 90 tablet 3   PREBIOTIC PRODUCT PO Take 1 Scoop by mouth daily.     rosuvastatin (CRESTOR) 10 MG tablet Take 1 tablet (10 mg total) by  mouth daily. 10 tablet 0   venlafaxine XR (EFFEXOR-XR) 37.5 MG 24 hr capsule TAKE ONE CAPSULE BY MOUTH DAILY. PLEASE SCHEDULE A PHYSICAL FOR MORE REFILLS. 10 capsule 0   No current facility-administered medications for this visit.    Allergies as of 01/24/2021 - Review Complete 01/24/2021  Allergen Reaction Noted   Spironolactone Hives and Swelling 06/23/2009   Paxil [paroxetine hcl] Other (See Comments) 11/09/2013   Sulfa antibiotics Hives and Itching 2021   Codeine Itching and Rash 07/25/2007    Family History  Problem Relation Age of Onset   Breast cancer Mother    Colon polyps Mother    Stroke Mother 44   GER disease Father    Diabetes Father    Kidney disease Father    CAD Father    Other Father        cause of death listed as resp. failure   Colon polyps Sister    Stroke Sister    Diabetes Maternal Grandfather    Heart disease Maternal Grandfather    Diabetes Paternal Grandmother    CAD Paternal Grandfather    Pancreatic cancer Paternal Uncle    Pancreatic cancer Maternal Uncle    Colon cancer Neg Hx    Esophageal cancer Neg Hx    Stomach cancer Neg Hx    Rectal cancer Neg Hx       Physical Exam: General:   Alert, in NAD. Appears her stated age.  HEENT: No scleral icterus. No bilateral temporal wasting. No thyromegaly.  Heart:  Regular rate and rhythm; no murmurs Pulm: Clear anteriorly; no wheezing Abdomen:  Soft. Central obesity. Nontender. Nondistended. Normal bowel sounds. No rebound or guarding. No hepatosplenomegaly.  No fluid wave.  LAD: No inguinal or umbilical LAD Extremities:  Without edema. No boney abnormalities.  Neurologic:  Alert and  oriented x4;  grossly normal neurologically; no asterixis or clonus. Skin: No jaundice. No palmar erythema or spider angioma. No Terry's nails.  Psych:  Alert and cooperative. Normal mood and affect.    Remas Sobel L. Tarri Glenn, MD, MPH 01/24/2021, 9:37 AM

## 2021-02-20 ENCOUNTER — Other Ambulatory Visit: Payer: Self-pay | Admitting: Internal Medicine

## 2021-02-22 ENCOUNTER — Encounter: Payer: Self-pay | Admitting: Internal Medicine

## 2021-03-23 ENCOUNTER — Encounter: Payer: Self-pay | Admitting: Internal Medicine

## 2021-03-23 ENCOUNTER — Ambulatory Visit (INDEPENDENT_AMBULATORY_CARE_PROVIDER_SITE_OTHER): Payer: BC Managed Care – PPO | Admitting: Internal Medicine

## 2021-03-23 VITALS — BP 110/70 | HR 85 | Temp 98.4°F | Wt 207.5 lb

## 2021-03-23 DIAGNOSIS — I10 Essential (primary) hypertension: Secondary | ICD-10-CM | POA: Diagnosis not present

## 2021-03-23 DIAGNOSIS — E785 Hyperlipidemia, unspecified: Secondary | ICD-10-CM

## 2021-03-23 DIAGNOSIS — E669 Obesity, unspecified: Secondary | ICD-10-CM

## 2021-03-23 DIAGNOSIS — L309 Dermatitis, unspecified: Secondary | ICD-10-CM

## 2021-03-23 DIAGNOSIS — K7581 Nonalcoholic steatohepatitis (NASH): Secondary | ICD-10-CM

## 2021-03-23 DIAGNOSIS — E1169 Type 2 diabetes mellitus with other specified complication: Secondary | ICD-10-CM

## 2021-03-23 DIAGNOSIS — E039 Hypothyroidism, unspecified: Secondary | ICD-10-CM

## 2021-03-23 LAB — POCT GLYCOSYLATED HEMOGLOBIN (HGB A1C): Hemoglobin A1C: 7.4 % — AB (ref 4.0–5.6)

## 2021-03-23 MED ORDER — EMPAGLIFLOZIN 10 MG PO TABS: 10.0000 mg | ORAL_TABLET | Freq: Every day | ORAL | 1 refills | Status: DC

## 2021-03-23 MED ORDER — TRIAMCINOLONE ACETONIDE 0.1 % EX CREA: 1.0000 "application " | TOPICAL_CREAM | Freq: Two times a day (BID) | CUTANEOUS | 0 refills | Status: DC

## 2021-03-23 NOTE — Patient Instructions (Signed)
-  Nice seeing you today!! ? ?-Start Jardiance 10 mg daily. ? ?-Schedule follow up in 3 months. ?

## 2021-03-23 NOTE — Progress Notes (Signed)
Established Patient Office Visit     This visit occurred during the SARS-CoV-2 public health emergency.  Safety protocols were in place, including screening questions prior to the visit, additional usage of staff PPE, and extensive cleaning of exam room while observing appropriate contact time as indicated for disinfecting solutions.    CC/Reason for Visit: Follow-up chronic conditions  HPI: Krista Dawson is a 56 y.o. female who is coming in today for the above mentioned reasons. Past Medical History is significant for: Hypertension, hyperlipidemia, type 2 diabetes, nonalcoholic steatohepatitis and hypothyroidism.  3 months ago we discontinued her Trulicity due to her dyspepsia type symptoms including reflux, flatulence, belching, bloating.  Her symptoms have completely resolved.  Unsurprisingly her fasting blood glucoses have been elevated and this is reflected in her A1c that has increased to 7.4.  She also has a rash over the anterior surface of her lower legs that she would like me to look at.   Past Medical/Surgical History: Past Medical History:  Diagnosis Date   Allergy    Asthma    exercise induced - not used inhaler couple of yrs pt reported 10-03-2018   Blood transfusion without reported diagnosis    Colon polyp    Complication of anesthesia    went into Vtach after breast reduction surgery 1988/89   Depression    DM (diabetes mellitus) ([facility])    GERD (gastroesophageal reflux disease)    HTN (hypertension)    Hyperlipidemia    Hypothyroidism    NASH (nonalcoholic steatohepatitis)    Obesity (BMI 30.0-34.9)    Post menopausal problems    Sleep apnea    no c-pap   Transaminitis    Vitamin D deficiency     Past Surgical History:  Procedure Laterality Date   ADRENALECTOMY Right    APPENDECTOMY     BIOPSY  12/12/2020   Procedure: BIOPSY;  Surgeon: Meridee Score Netty Starring., MD;  Location: [facility];  Service: Gastroenterology;;   BREAST REDUCTION SURGERY      CHOLECYSTECTOMY     ESOPHAGOGASTRODUODENOSCOPY N/A 12/12/2020   Procedure: ESOPHAGOGASTRODUODENOSCOPY (EGD);  Surgeon: Lemar Lofty., MD;  Location: [facility];  Service: Gastroenterology;  Laterality: N/A;   EUS N/A 12/12/2020   Procedure: UPPER ENDOSCOPIC ULTRASOUND (EUS) LINEAR;  Surgeon: Lemar Lofty., MD;  Location: [facility];  Service: Gastroenterology;  Laterality: N/A;   HYSTERECTOMY ABDOMINAL WITH SALPINGECTOMY     LIVER BIOPSY  2008, 2013   NASH   TONSILLECTOMY     UPPER GASTROINTESTINAL ENDOSCOPY      Social History:  reports that she quit smoking about 14 years ago. Her smoking use included cigarettes. She has never used smokeless tobacco. She reports current alcohol use. She reports that she does not use drugs.  Allergies: Allergies  Allergen Reactions   Spironolactone Hives and Swelling   Paxil [Paroxetine Hcl] Other (See Comments)    Other reaction(s): Other (comments) WORSENED DEPRESSION. WORSENED DEPRESSION.    Sulfa Antibiotics Hives and Itching   Codeine Itching and Rash    Family History:  Family History  Problem Relation Age of Onset   Breast cancer Mother    Colon polyps Mother    Stroke Mother 72   GER disease Father    Diabetes Father    Kidney disease Father    CAD Father    Other Father        cause of death listed as resp. failure   Colon polyps Sister    Stroke Sister  Diabetes Maternal Grandfather    Heart disease Maternal Grandfather    Diabetes Paternal Grandmother    CAD Paternal Grandfather    Pancreatic cancer Paternal Uncle    Pancreatic cancer Maternal Uncle    Colon cancer Neg Hx    Esophageal cancer Neg Hx    Stomach cancer Neg Hx    Rectal cancer Neg Hx      Current Outpatient Medications:    aspirin EC 81 MG tablet, Take 81 mg by mouth every evening., Disp: , Rfl:    Blood Glucose Monitoring Suppl (CONTOUR NEXT ONE) KIT, by Does not apply route., Disp: , Rfl:    Calcium-Magnesium (CAL-MAG  PO), Take by mouth., Disp: , Rfl:    Cholecalciferol (VITAMIN D3) 50 MCG (2000 UT) TABS, Take 2,000 Units by mouth in the morning., Disp: , Rfl:    empagliflozin (JARDIANCE) 10 MG TABS tablet, Take 1 tablet (10 mg total) by mouth daily before breakfast., Disp: 90 tablet, Rfl: 1   hydrochlorothiazide (HYDRODIURIL) 25 MG tablet, TAKE 1 TABLET BY MOUTH DAILY, Disp: 90 tablet, Rfl: 1   levothyroxine (SYNTHROID) 88 MCG tablet, Take 1 tablet (88 mcg total) by mouth daily before breakfast., Disp: 10 tablet, Rfl: 0   lisinopril (ZESTRIL) 20 MG tablet, TAKE 1 TABLET BY MOUTH DAILY, Disp: 90 tablet, Rfl: 1   Melatonin 10 MG CAPS, Take 10 mg by mouth at bedtime., Disp: , Rfl:    metFORMIN (GLUCOPHAGE) 500 MG tablet, TAKE TWO TABLETS BY MOUTH TWO TIMES DAILY. GENERIC EQUIVALENT FOR GLUCOPHAGE., Disp: 40 tablet, Rfl: 0   Microlet Lancets MISC, by Does not apply route. Test 3-4 times daily (Micorlet Colored Lancets), Disp: , Rfl:    Omega-3 Fatty Acids (FISH OIL) 1000 MG CAPS, Take 1 capsule by mouth daily., Disp: , Rfl:    pantoprazole (PROTONIX) 40 MG tablet, Take 1 tablet (40 mg total) by mouth daily. Take 30 minutes before breakfast., Disp: 90 tablet, Rfl: 3   PREBIOTIC PRODUCT PO, Take 1 Scoop by mouth daily., Disp: , Rfl:    rosuvastatin (CRESTOR) 10 MG tablet, Take 1 tablet (10 mg total) by mouth daily., Disp: 10 tablet, Rfl: 0   triamcinolone cream (KENALOG) 0.1 %, Apply 1 application. topically 2 (two) times daily., Disp: 30 g, Rfl: 0   venlafaxine XR (EFFEXOR-XR) 37.5 MG 24 hr capsule, TAKE ONE CAPSULE BY MOUTH DAILY. PLEASE SCHEDULE A PHYSICAL FOR MORE REFILLS., Disp: 10 capsule, Rfl: 0   GARLIC PO, Take 1 capsule by mouth daily., Disp: , Rfl:   Review of Systems:  Constitutional: Denies fever, chills, diaphoresis, appetite change and fatigue.  HEENT: Denies photophobia, eye pain, redness, hearing loss, ear pain, congestion, sore throat, rhinorrhea, sneezing, mouth sores, trouble swallowing, neck  pain, neck stiffness and tinnitus.   Respiratory: Denies SOB, DOE, cough, chest tightness,  and wheezing.   Cardiovascular: Denies chest pain, palpitations and leg swelling.  Gastrointestinal: Denies nausea, vomiting, abdominal pain, diarrhea, constipation, blood in stool and abdominal distention.  Genitourinary: Denies dysuria, urgency, frequency, hematuria, flank pain and difficulty urinating.  Endocrine: Denies: hot or cold intolerance, sweats, changes in hair or nails, polyuria, polydipsia. Musculoskeletal: Denies myalgias, back pain, joint swelling, arthralgias and gait problem.  Skin: Denies pallor and wound.  Neurological: Denies dizziness, seizures, syncope, weakness, light-headedness, numbness and headaches.  Hematological: Denies adenopathy. Easy bruising, personal or family bleeding history  Psychiatric/Behavioral: Denies suicidal ideation, mood changes, confusion, nervousness, sleep disturbance and agitation    Physical Exam: Vitals:   03/23/21  0906  BP: 110/70  Pulse: 85  Temp: 98.4 F (36.9 C)  TempSrc: Oral  SpO2: 98%  Weight: 207 lb 8 oz (94.1 kg)    Body mass index is 34.53 kg/m.   Constitutional: NAD, calm, comfortable Eyes: PERRL, lids and conjunctivae normal, wears corrective lenses ENMT: Mucous membranes are moist.  Respiratory: clear to auscultation bilaterally, no wheezing, no crackles. Normal respiratory effort. No accessory muscle use.  Cardiovascular: Regular rate and rhythm, no murmurs / rubs / gallops. No extremity edema.  Skin: Eczema type rash over anterior surfaces of both lower legs. Neurologic: Grossly intact and nonfocal Psychiatric: Normal judgment and insight. Alert and oriented x 3. Normal mood.    Impression and Plan:  Type 2 diabetes mellitus with other specified complication, without long-term current use of insulin (HCC)  - Plan: POCT glycosylated hemoglobin (Hb A1C), empagliflozin (JARDIANCE) 10 MG TABS tablet -A1c has increased to  7.4 from 6.6 after discontinuing Trulicity. -Continue maximal dose metformin, add Jardiance 10 mg daily, recheck A1c in 3 months.  Primary hypertension -Well-controlled  Hyperlipidemia, unspecified hyperlipidemia type -At goal with an LDL of 60 in December 2022, on rosuvastatin 10 mg daily  NASH (nonalcoholic steatohepatitis) -Followed by GI, LFTs have been stable  Hypothyroidism, unspecified type -TSH was within range in December, she is on levothyroxine 88 mcg daily.  Obesity (BMI 30.0-34.9) -Discussed healthy lifestyle, including increased physical activity and better food choices to promote weight loss.  Eczema, unspecified type  - Plan: triamcinolone cream (KENALOG) 0.1 %  Time spent: 34 minutes reviewing chart, interviewing and examining patient and formulating plan of care.   Patient Instructions  -Nice seeing you today!!  -Start Jardiance 10 mg daily.  -Schedule follow up in 3 months.    Chaya Jan, MD [facility]

## 2021-03-24 ENCOUNTER — Encounter: Payer: Self-pay | Admitting: Internal Medicine

## 2021-03-24 ENCOUNTER — Encounter: Payer: Self-pay | Admitting: Gastroenterology

## 2021-03-27 MED ORDER — DAPAGLIFLOZIN PROPANEDIOL 5 MG PO TABS: 5.0000 mg | ORAL_TABLET | Freq: Every day | ORAL | 0 refills | Status: DC

## 2021-05-08 ENCOUNTER — Other Ambulatory Visit: Payer: Self-pay | Admitting: Internal Medicine

## 2021-05-08 DIAGNOSIS — E039 Hypothyroidism, unspecified: Secondary | ICD-10-CM

## 2021-06-22 ENCOUNTER — Encounter: Payer: Self-pay | Admitting: Internal Medicine

## 2021-06-22 ENCOUNTER — Ambulatory Visit (INDEPENDENT_AMBULATORY_CARE_PROVIDER_SITE_OTHER): Payer: BC Managed Care – PPO | Admitting: Internal Medicine

## 2021-06-22 VITALS — BP 120/80 | HR 82 | Temp 98.2°F | Wt 207.7 lb

## 2021-06-22 DIAGNOSIS — E669 Obesity, unspecified: Secondary | ICD-10-CM

## 2021-06-22 DIAGNOSIS — E1169 Type 2 diabetes mellitus with other specified complication: Secondary | ICD-10-CM | POA: Diagnosis not present

## 2021-06-22 DIAGNOSIS — E782 Mixed hyperlipidemia: Secondary | ICD-10-CM | POA: Diagnosis not present

## 2021-06-22 DIAGNOSIS — I10 Essential (primary) hypertension: Secondary | ICD-10-CM | POA: Diagnosis not present

## 2021-06-22 LAB — MICROALBUMIN / CREATININE URINE RATIO
Creatinine,U: 37.6 mg/dL
Microalb Creat Ratio: 3.2 mg/g (ref 0.0–30.0)
Microalb, Ur: 1.2 mg/dL (ref 0.0–1.9)

## 2021-06-22 LAB — COMPREHENSIVE METABOLIC PANEL
ALT: 80 U/L — ABNORMAL HIGH (ref 0–35)
AST: 55 U/L — ABNORMAL HIGH (ref 0–37)
Albumin: 4.6 g/dL (ref 3.5–5.2)
Alkaline Phosphatase: 77 U/L (ref 39–117)
BUN: 29 mg/dL — ABNORMAL HIGH (ref 6–23)
CO2: 21 mEq/L (ref 19–32)
Calcium: 10.4 mg/dL (ref 8.4–10.5)
Chloride: 102 mEq/L (ref 96–112)
Creatinine, Ser: 1.01 mg/dL (ref 0.40–1.20)
GFR: 63.31 mL/min (ref >60.00)
Glucose, Bld: 149 mg/dL — ABNORMAL HIGH (ref 70–99)
Potassium: 4.5 mEq/L (ref 3.5–5.1)
Sodium: 134 mEq/L — ABNORMAL LOW (ref 135–145)
Total Bilirubin: 0.3 mg/dL (ref 0.2–1.2)
Total Protein: 7.2 g/dL (ref 6.0–8.3)

## 2021-06-22 LAB — LIPID PANEL
Cholesterol: 127 mg/dL (ref 0–200)
HDL: 37.6 mg/dL — ABNORMAL LOW
NonHDL: 88.93
Total CHOL/HDL Ratio: 3
Triglycerides: 222 mg/dL — ABNORMAL HIGH (ref 0.0–149.0)
VLDL: 44.4 mg/dL — ABNORMAL HIGH (ref 0.0–40.0)

## 2021-06-22 LAB — HEMOGLOBIN A1C: Hemoglobin A1C: 7.7

## 2021-06-22 LAB — POCT GLYCOSYLATED HEMOGLOBIN (HGB A1C): Hemoglobin A1C: 7.7 % — AB (ref 4.0–5.6)

## 2021-06-22 LAB — LDL CHOLESTEROL, DIRECT: Direct LDL: 62 mg/dL

## 2021-06-22 MED ORDER — DAPAGLIFLOZIN PROPANEDIOL 5 MG PO TABS: 10.0000 mg | ORAL_TABLET | Freq: Every day | ORAL | 1 refills | Status: DC

## 2021-06-22 NOTE — Progress Notes (Signed)
Established Patient Office Visit     CC/Reason for Visit: 3-monthfollow-up chronic medical conditions  HPI: Krista Pucciois a 56y.o. female who is coming in today for the above mentioned reasons. Past Medical History is significant for: Hypertension, hyperlipidemia, type 2 diabetes, hypothyroidism, nonalcoholic steatohepatitis, obesity.  We have been having issues getting her A1c controlled ever since she came off Trulicity due to side effects.  It has further increased to 7.7 today.  She states the lowest her sugar has been at home is 146.  She was started on Farxiga 5 mg daily 3 months ago.  She has been doing a keto diet but is unable to control carb cravings.   Past Medical/Surgical History: Past Medical History:  Diagnosis Date   Allergy    Asthma    exercise induced - not used inhaler couple of yrs pt reported 10-03-2018   Blood transfusion without reported diagnosis    Colon polyp    Complication of anesthesia    went into Vtach after breast reduction surgery 1988/89   Depression    [place]    GERD (gastroesophageal reflux disease)    HTN (hypertension)    Hyperlipidemia    Hypothyroidism    NASH (nonalcoholic steatohepatitis)    Obesity (BMI 30.0-34.9)    Post menopausal problems    Sleep apnea    no c-pap   Transaminitis    Vitamin D deficiency     Past Surgical History:  Procedure Laterality Date   ADRENALECTOMY Right    APPENDECTOMY     BIOPSY  12/12/2020   Procedure: BIOPSY;  Surgeon: MRush LandmarkGTelford Nab, MD;  [facility];  Service: Gastroenterology;;   BREAST REDUCTION SURGERY     CHOLECYSTECTOMY     ESOPHAGOGASTRODUODENOSCOPY N/A 12/12/2020   Procedure: ESOPHAGOGASTRODUODENOSCOPY (EGD);  Surgeon: MIrving Copas, MD;  [facility];  Service: Gastroenterology;  Laterality: N/A;   EUS N/A 12/12/2020   Procedure: UPPER ENDOSCOPIC ULTRASOUND (EUS) LINEAR;  Surgeon: MIrving Copas, MD;   [facility];  Service: Gastroenterology;  Laterality: N/A;   HYSTERECTOMY ABDOMINAL WITH SALPINGECTOMY     LIVER BIOPSY  2008, 2013   NASH   TONSILLECTOMY     UPPER GASTROINTESTINAL ENDOSCOPY      Social History:  reports that she quit smoking about 14 years ago. Her smoking use included cigarettes. She has never used smokeless tobacco. She reports current alcohol use. She reports that she does not use drugs.  Allergies: Allergies  Allergen Reactions   Spironolactone Hives and Swelling   Paxil [Paroxetine Hcl] Other (See Comments)    Other reaction(s): Other (comments) WORSENED DEPRESSION. WORSENED DEPRESSION.    Sulfa Antibiotics Hives and Itching   Codeine Itching and Rash    Family History:  Family History  Problem Relation Age of Onset   Breast cancer Mother    Colon polyps Mother    Stroke Mother 528  GER disease Father    Diabetes Father    Kidney disease Father    CAD Father    Other Father        cause of death listed as resp. failure   Colon polyps Sister    Stroke Sister    Diabetes Maternal Grandfather    Heart disease Maternal Grandfather    Diabetes Paternal Grandmother    CAD Paternal Grandfather    Pancreatic cancer Paternal Uncle    Pancreatic cancer Maternal Uncle    Colon cancer  Neg Hx    Esophageal cancer Neg Hx    Stomach cancer Neg Hx    Rectal cancer Neg Hx      Current Outpatient Medications:    aspirin EC 81 MG tablet, Take 81 mg by mouth every evening., Disp: , Rfl:    Blood Glucose Monitoring Suppl (CONTOUR NEXT ONE) KIT, by Does not apply route., Disp: , Rfl:    Calcium-Magnesium (CAL-MAG PO), Take by mouth., Disp: , Rfl:    Cholecalciferol (VITAMIN D3) 50 MCG (2000 UT) TABS, Take 2,000 Units by mouth in the morning., Disp: , Rfl:    hydrochlorothiazide (HYDRODIURIL) 25 MG tablet, TAKE 1 TABLET BY MOUTH DAILY, Disp: 90 tablet, Rfl: 1   levothyroxine (SYNTHROID) 88 MCG tablet, TAKE 1 TABLET BY MOUTH DAILY BEFORE BREAKFAST,  Disp: 90 tablet, Rfl: 1   lisinopril (ZESTRIL) 20 MG tablet, TAKE 1 TABLET BY MOUTH DAILY, Disp: 90 tablet, Rfl: 1   Melatonin 10 MG CAPS, Take 10 mg by mouth at bedtime., Disp: , Rfl:    metFORMIN (GLUCOPHAGE) 500 MG tablet, TAKE 2 TABLETS BY MOUTH 2 TIMES DAILY GENERIC EQUIVALENT FOR GLUCOPHAGE, Disp: 360 tablet, Rfl: 1   Microlet Lancets MISC, by Does not apply route. Test 3-4 times daily (Micorlet Colored Lancets), Disp: , Rfl:    pantoprazole (PROTONIX) 40 MG tablet, Take 1 tablet (40 mg total) by mouth daily. Take 30 minutes before breakfast., Disp: 90 tablet, Rfl: 3   PREBIOTIC PRODUCT PO, Take 1 Scoop by mouth daily., Disp: , Rfl:    rosuvastatin (CRESTOR) 10 MG tablet, TAKE 1 TABLET BY MOUTH DAILY, Disp: 90 tablet, Rfl: 1   triamcinolone cream (KENALOG) 0.1 %, Apply 1 application. topically 2 (two) times daily., Disp: 30 g, Rfl: 0   venlafaxine XR (EFFEXOR-XR) 37.5 MG 24 hr capsule, TAKE ONE CAPSULE BY MOUTH DAILY. PLEASE SCHEDULE A PHYSICAL FOR MORE REFILLS., Disp: 10 capsule, Rfl: 0   dapagliflozin propanediol (FARXIGA) 5 MG TABS tablet, Take 2 tablets (10 mg total) by mouth daily before breakfast., Disp: 90 tablet, Rfl: 1  Review of Systems:  Constitutional: Denies fever, chills, diaphoresis, appetite change and fatigue.  HEENT: Denies photophobia, eye pain, redness, hearing loss, ear pain, congestion, sore throat, rhinorrhea, sneezing, mouth sores, trouble swallowing, neck pain, neck stiffness and tinnitus.   Respiratory: Denies SOB, DOE, cough, chest tightness,  and wheezing.   Cardiovascular: Denies chest pain, palpitations and leg swelling.  Gastrointestinal: Denies nausea, vomiting, abdominal pain, diarrhea, constipation, blood in stool and abdominal distention.  Genitourinary: Denies dysuria, urgency, frequency, hematuria, flank pain and difficulty urinating.  Endocrine: Denies: hot or cold intolerance, sweats, changes in hair or nails, polyuria, polydipsia. Musculoskeletal:  Denies myalgias, back pain, joint swelling, arthralgias and gait problem.  Skin: Denies pallor, rash and wound.  Neurological: Denies dizziness, seizures, syncope, weakness, light-headedness, numbness and headaches.  Hematological: Denies adenopathy. Easy bruising, personal or family bleeding history  Psychiatric/Behavioral: Denies suicidal ideation, mood changes, confusion, nervousness, sleep disturbance and agitation    Physical Exam: Vitals:   06/22/21 0906  BP: 120/80  Pulse: 82  Temp: 98.2 F (36.8 C)  TempSrc: Oral  SpO2: 95%  Weight: 207 lb 11.2 oz (94.2 kg)    Body mass index is 34.56 kg/m.   Constitutional: NAD, calm, comfortable Eyes: PERRL, lids and conjunctivae normal ENMT: Mucous membranes are moist.  Respiratory: clear to auscultation bilaterally, no wheezing, no crackles. Normal respiratory effort. No accessory muscle use.  Cardiovascular: Regular rate and rhythm, no murmurs /  rubs / gallops. No extremity edema.  Neurologic: Grossly intact and nonfocal Psychiatric: Normal judgment and insight. Alert and oriented x 3. Normal mood.    Impression and Plan:  Type 2 diabetes mellitus with other specified complication, without long-term current use of insulin ([place])  - Plan: POCT glycosylated hemoglobin (Hb A1C), Urine microalbumin-creatinine with uACR, Comprehensive metabolic panel, dapagliflozin propanediol (FARXIGA) 5 MG TABS tablet -Check microalbumin, increase Farxiga to 10 mg, she will work on lifestyle changes and return in 3 months for follow-up.  Primary hypertension -Well-controlled.  Mixed hyperlipidemia  - Plan: Lipid panel  Obesity (BMI 30.0-34.9) -Discussed healthy lifestyle, including increased physical activity and better food choices to promote weight loss. -We have discussed coming off keto diet since she is eating high fat as well as including carbs, she will do a low-carb dieting instead, we have talked about increasing aerobic  activity.    Time spent:33 minutes reviewing chart, interviewing and examining patient and formulating plan of care.     Lelon Frohlich, MD [facility]

## 2021-06-27 ENCOUNTER — Telehealth: Payer: Self-pay | Admitting: *Deleted

## 2021-06-27 NOTE — Telephone Encounter (Signed)
Prior Josem Kaufmann has been started for   Farxiga 5 mg Key: [id number]

## 2021-06-29 NOTE — Telephone Encounter (Signed)
PA for fargixa was denied.  Please advise.

## 2021-07-12 ENCOUNTER — Other Ambulatory Visit: Payer: Self-pay

## 2021-07-12 DIAGNOSIS — Z8719 Personal history of other diseases of the digestive system: Secondary | ICD-10-CM

## 2021-07-12 DIAGNOSIS — R748 Abnormal levels of other serum enzymes: Secondary | ICD-10-CM

## 2021-08-07 ENCOUNTER — Other Ambulatory Visit: Payer: Self-pay | Admitting: Internal Medicine

## 2021-08-08 NOTE — Telephone Encounter (Signed)
Last OV 06/22/21 Next OV 09/19/21

## 2021-08-11 ENCOUNTER — Other Ambulatory Visit (INDEPENDENT_AMBULATORY_CARE_PROVIDER_SITE_OTHER): Payer: BC Managed Care – PPO

## 2021-08-11 DIAGNOSIS — Z8719 Personal history of other diseases of the digestive system: Secondary | ICD-10-CM | POA: Diagnosis not present

## 2021-08-11 DIAGNOSIS — R748 Abnormal levels of other serum enzymes: Secondary | ICD-10-CM

## 2021-08-11 LAB — LIPASE: Lipase: 93 U/L — ABNORMAL HIGH (ref 11.0–59.0)

## 2021-08-16 ENCOUNTER — Ambulatory Visit: Payer: BC Managed Care – PPO | Admitting: Gastroenterology

## 2021-08-16 ENCOUNTER — Ambulatory Visit (INDEPENDENT_AMBULATORY_CARE_PROVIDER_SITE_OTHER): Payer: BC Managed Care – PPO | Admitting: Gastroenterology

## 2021-08-16 ENCOUNTER — Other Ambulatory Visit: Payer: BC Managed Care – PPO

## 2021-08-16 ENCOUNTER — Encounter: Payer: Self-pay | Admitting: Gastroenterology

## 2021-08-16 VITALS — BP 140/74 | HR 102 | Ht 65.5 in | Wt 207.0 lb

## 2021-08-16 DIAGNOSIS — R933 Abnormal findings on diagnostic imaging of other parts of digestive tract: Secondary | ICD-10-CM

## 2021-08-16 DIAGNOSIS — K7581 Nonalcoholic steatohepatitis (NASH): Secondary | ICD-10-CM | POA: Diagnosis not present

## 2021-08-16 DIAGNOSIS — Z800 Family history of malignant neoplasm of digestive organs: Secondary | ICD-10-CM

## 2021-08-16 DIAGNOSIS — Z8719 Personal history of other diseases of the digestive system: Secondary | ICD-10-CM | POA: Diagnosis not present

## 2021-08-16 DIAGNOSIS — R748 Abnormal levels of other serum enzymes: Secondary | ICD-10-CM

## 2021-08-16 NOTE — Progress Notes (Signed)
Referring Provider: Isaac Bliss, [place] [facility]* Primary Care Physician:  Isaac Bliss, Rayford Halsted, MD  Chief complaint: Abdominal pain and bloating  IMPRESSION:  Elevated lipase     - ? Acute pancreatitis 07/2020    - normal MRI/MRCP 07/2020    - essentially normal EUS 12/12/20    - did not improve despite discontinuation of Trulicity Elevated transaminases - increased from baseline Nonalcoholic fatty liver disease with abnormal transaminases    - liver biopsy in 2007 showed portal fibrosis    - liver biopsy in 2011 showed resolution of ballooning and steatosis    - FibroScan 06/21/2016 showed a median E6.7, CAP 325       - indicating minimal fibrosis and moderate steatosis.    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/2019: F0 fibrosis, S3 steatosis, N2 NASH History of suspected SIBO    - presented with bloating, eructation, epigastric fullness, nausea and early satiety    - symptoms resolved with Xifaxan 550 mg TID x 14 days Retained food noted at time of EGD    - follow-up gastric emptying scan negative Reflux and gastritis on EGD 03/13/19 Hyperplastic polyp on colonoscopy 10/03/18 Family history of colon polyps    - Sister adenoma >1.0 cm, CHEK-2 mutation    - Mother with colon polyps and breast cancer Family history of pancreatic cancer (Maternal uncle and paternal uncle <60) BMI 34  Acute symptoms with elevated lipase: Symptoms are still present although not as intense off Trulicity. Lipase remains elevated.  Given her difficult to manage diabetes, family history of pancreatic cancer, and EUS findings, will plan pancreatic protocol CT now and stool for fecal elastase.  Both GLP-1 receptor agonists and DPP-4 inhibitors are associated with increased levels of serum lipase in many patients with type 2 diabetes, possibly suggesting the presence of pancreatic inflammation. Whether this leads to acute pancreatitis or chronic pancreatitis, as reported in rat models, is currently unknown. I'm referring her to  endocrinology to discuss alternatives as she would like to avoid insulin.   History of suspected SIBO: Symptoms initially  resolved with Xifaxan 550 mg TID for 14 days. Continue to use dicyclomine PRN. Consider repeat antibiotics with recurrent symptoms.   Reflux and gastritis on recent EGD:  Continue pantoprazole 40 mg QD. Weight loss will likely provide additional relief. She is aware of lifestyle modifications.   Fatty liver: NASH FibroSURE for restaging given her concurrent diabetes.   Family history of polyps: No precancerous polyps on colonoscopy 09/2018. Surveillance colonoscopy recommended in 2025 given the family history.   Both GLP-1 receptor agonists and DPP-4 inhibitors are associated with increased levels of serum lipase more than serum amylase in many patients with type 2 diabetes, possibly suggesting the presence of pancreatic inflammation. Whether this finding may potentially lead to acute pancreatitis or chronic pancreatitis, as reported in rat models, is currently unknown. Careful observation of patients taking these medications may be prudent.   PLAN: - Continue pantoprazole 40 mg QD (one year refill today) - Work to maintain a healthy weight and maximize control of diabetes and hyperlipidemia - Stool for fecal elastase - NASH FibroSURE - Pancreatic protocol CT with special attention to the liver given elevated transaminases - Endocrinology Referral: Treatment options for diabetes to minimize pancreatitis risk - Colonoscopy 2025, earlier with new symptoms - Follow-up in the office as needed, at least annually   HPI: Vaidehi Braddy is a 56 y.o. female who returns in follow-up for abnormal lipase and overall just not feeling well.   I  initially met her for screening colonoscopy 10/03/2018.  A small hyperplastic polyp was removed at that time. Surveillance colonoscopy recommended in 5 years given her family history.  Referred in 2021 for fatty liver and elevated liver  enzymes. Abdominal ultrasound 02/23/19 to follow-up on the increase in her liver enzymes showed increased liver echogenicty likely reflecting steatosis. At the time of that consultation, she reported symptoms of dyspepsia.  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  Esophageal biopsies showed reflux.  Gastric biopsy showed mild reactive gastropathy and mild chronic gastritis.  There was no H. pylori.  Duodenal biopsies were normal.  Follow-up gastric emptying scan 03/27/19 to evaluate for gastroparesis given the retained food was normal.  She was successfully treated with 14 days of Xifaxan and dicyclomine in April 2021 for post-prandial bloating, abdominal pressure and nausea. Bowel habits returned to normal after treatment.   Had an urgent work-in appointment in July 2022 with Carl Best, NP for recurrent symptoms. Switching from omeprazole to pantoprazole provided relief within a few days without need for repeat Xifaxan. Labs showed an elevated lipase at 101. CBC and liver enzymes were normal. MRI/MRCP 08/03/20 showed no acute findings. Pancreas was normal. Fatty liver. Prior cholecystecytomy.   She has regained the weight she initially lost during the most severe of the symptoms.   Gastric mapping through chiropractor Eugenia Pancoast) identified gluten sensitivity. She was started on multiple supplements and feels that this is also improving her symptoms.   Upper EUS 12/12/2020 with Dr. Rush Landmark to evaluate her persistently elevated lipase showed a 2 cm hiatal hernia, irregular Z-line, and gastritis.  Pancreatic parenchymal abnormalities consisting of lobularity and hyperechoic strands were noted in the entire pancreas.  There was no sign of significant pathology in the ducts of the pancreas.  She did not meet criteria for a diagnosis of chronic pancreatitis.  Bile ducts and ampulla otherwise appeared normal.  Benign lymph  nodes consistent with inflammatory changes seen.  Gastric biopsy showed erosive gastritis.  There was no H. pylori or intestinal metaplasia.  At the time of follow-up  01/24/21 she was feeling better, with resolution of bloating and abdominal pain after discontinuing Trulicity. At that point, she didn't feel additional evaluation or treatment was needed.  Returns today in follow-up. She is just not feeling right. She has had increasing difficulty managing her diabetes since stopping Trulicity.   Lipase has been elevated over the last year -ranging from 70 to to a high of 122.  Most recently the lipase was 93. AST and ALT are mildly elevated at 55 and 80, respectively CBC was normal in December  Returns today in scheduled follow-up.  Symptoms have been much better since she stopped taking Trulicity. Her bloating and abdominal pain have resolved. She does not feel that additional evaluation or treatment is needed. She is having terrible carb cravings since she stopped Trulicity.    Paternal uncle and maternal uncle with pancreatic cancer, and this has her concerned. Her sister in a primary care nurse practitioner in [place].    Past Medical History:  Diagnosis Date   Allergy    Asthma    exercise induced - not used inhaler couple of yrs pt reported 10-03-2018   Blood transfusion without reported diagnosis    Colon polyp    Complication of anesthesia    went into Vtach after breast reduction surgery 1988/89   Depression    DM (diabetes mellitus) ([place])  GERD (gastroesophageal reflux disease)    HTN (hypertension)    Hyperlipidemia    Hypothyroidism    NASH (nonalcoholic steatohepatitis)    Obesity (BMI 30.0-34.9)    Post menopausal problems    Sleep apnea    no c-pap   Transaminitis    Vitamin D deficiency     Past Surgical History:  Procedure Laterality Date   ADRENALECTOMY Right    APPENDECTOMY     BIOPSY  12/12/2020   Procedure: BIOPSY;  Surgeon: Rush Landmark Telford Nab., MD;   [facility];  Service: Gastroenterology;;   BREAST REDUCTION SURGERY     CHOLECYSTECTOMY     ESOPHAGOGASTRODUODENOSCOPY N/A 12/12/2020   Procedure: ESOPHAGOGASTRODUODENOSCOPY (EGD);  Surgeon: Irving Copas., MD;  [facility];  Service: Gastroenterology;  Laterality: N/A;   EUS N/A 12/12/2020   Procedure: UPPER ENDOSCOPIC ULTRASOUND (EUS) LINEAR;  Surgeon: Irving Copas., MD;  [facility];  Service: Gastroenterology;  Laterality: N/A;   HYSTERECTOMY ABDOMINAL WITH SALPINGECTOMY     LIVER BIOPSY  2008, 2013   NASH   TONSILLECTOMY     UPPER GASTROINTESTINAL ENDOSCOPY      Current Outpatient Medications  Medication Sig Dispense Refill   aspirin EC 81 MG tablet Take 81 mg by mouth every evening.     Blood Glucose Monitoring Suppl (CONTOUR NEXT ONE) KIT by Does not apply route.     Calcium-Magnesium (CAL-MAG PO) Take by mouth.     Cholecalciferol (VITAMIN D3) 50 MCG (2000 UT) TABS Take 2,000 Units by mouth in the morning.     dapagliflozin propanediol (FARXIGA) 5 MG TABS tablet Take 2 tablets (10 mg total) by mouth daily before breakfast. 90 tablet 1   hydrochlorothiazide (HYDRODIURIL) 25 MG tablet TAKE 1 TABLET BY MOUTH DAILY 90 tablet 1   levothyroxine (SYNTHROID) 88 MCG tablet TAKE 1 TABLET BY MOUTH DAILY BEFORE BREAKFAST 90 tablet 1   lisinopril (ZESTRIL) 20 MG tablet TAKE 1 TABLET BY MOUTH DAILY 90 tablet 1   Melatonin 10 MG CAPS Take 10 mg by mouth at bedtime.     metFORMIN (GLUCOPHAGE) 500 MG tablet TAKE 2 TABLETS BY MOUTH 2 TIMES DAILY GENERIC EQUIVALENT FOR GLUCOPHAGE 360 tablet 1   Microlet Lancets MISC by Does not apply route. Test 3-4 times daily (Micorlet Colored Lancets)     pantoprazole (PROTONIX) 40 MG tablet Take 1 tablet (40 mg total) by mouth daily. Take 30 minutes before breakfast. 90 tablet 3   rosuvastatin (CRESTOR) 10 MG tablet TAKE 1 TABLET BY MOUTH DAILY 90 tablet 1   triamcinolone cream (KENALOG) 0.1 % Apply 1  application. topically 2 (two) times daily. (Patient taking differently: Apply 1 application  topically 2 (two) times daily. PRN) 30 g 0   venlafaxine XR (EFFEXOR-XR) 37.5 MG 24 hr capsule TAKE ONE CAPSULE BY MOUTH DAILY. PLEASE SCHEDULE A PHYSICAL FOR MORE REFILLS. 90 capsule 0   PREBIOTIC PRODUCT PO Take 1 Scoop by mouth daily. (Patient not taking: Reported on 08/16/2021)     No current facility-administered medications for this visit.    Allergies as of 08/16/2021 - Review Complete 08/16/2021  Allergen Reaction Noted   Spironolactone Hives and Swelling 06/23/2009   Paxil [paroxetine hcl] Other (See Comments) 11/09/2013   Sulfa antibiotics Hives and Itching 2021   Codeine Itching and Rash 07/25/2007     Physical Exam: General:   Alert, in NAD. Appears her stated age.  HEENT: No scleral icterus. No bilateral temporal wasting. No thyromegaly.  Heart:  Regular rate and rhythm; no murmurs Pulm: Clear anteriorly; no wheezing Abdomen:  Soft. Central obesity. Nontender. Nondistended. Normal bowel sounds. No rebound or guarding. No hepatosplenomegaly.  No fluid wave.  LAD: No inguinal or umbilical LAD Extremities:  Without edema. No boney abnormalities.  Neurologic:  Alert and  oriented x4;  grossly normal neurologically; no asterixis or clonus. Skin: No jaundice. No palmar erythema or spider angioma. No Terry's nails.  Psych:  Alert and cooperative. Normal mood and affect.    Jereme Loren L. Tarri Glenn, MD, MPH 08/16/2021, 2:22 PM

## 2021-08-16 NOTE — Patient Instructions (Addendum)
It was my pleasure to provide care to you today. Based on our discussion, I am providing you with my recommendations below:  RECOMMENDATION(S):   IMAGING:  You have been scheduled for a CT scan of the abdomen and pelvis at [facility], 1st floor Radiology. You are scheduled on Friday 08/25/21 at 9 am. You should arrive 15 minutes prior to your appointment time for registration   If you have any questions regarding your exam or if you need to reschedule, you may call [facility] at [phone] between the hours of 8:00 am and 5:00 pm, Monday-Friday.   LABS:   Please proceed to the basement level for lab work before leaving today. Press "B" on the elevator. The lab is located at the first door on the left as you exit the elevator.  REFERRAL:  A referral, your demographics, a copy of your insurance card and your records will be sent to [facility]. You will receive a call from their office regarding the date, time and location of your appointment.  FOLLOW UP:  After your CT scan and labs, you will receive a call from my office staff regarding my recommendation for follow up.  BMI:  If you are age 54 or older, your body mass index should be between 23-30. Your Body mass index is 33.92 kg/m. If this is out of the aforementioned range listed, please consider follow up with your Primary Care Provider.  If you are age 54 or younger, your body mass index should be between 19-25. Your Body mass index is 33.92 kg/m. If this is out of the aformentioned range listed, please consider follow up with your Primary Care Provider.   MY CHART:  The [place] [facility] providers would like to encourage you to use [facility] to communicate with providers for non-urgent requests or questions.  Due to long hold times on the telephone, sending your provider a message by [facility] may be a faster and more efficient way to get a response.  Please allow 48 business hours for a response.  Please  remember that this is for non-urgent requests.   Thank you for trusting me with your gastrointestinal care!    Thornton Park, MD, MPH

## 2021-08-17 ENCOUNTER — Other Ambulatory Visit: Payer: BC Managed Care – PPO

## 2021-08-17 DIAGNOSIS — Z8719 Personal history of other diseases of the digestive system: Secondary | ICD-10-CM

## 2021-08-17 DIAGNOSIS — R933 Abnormal findings on diagnostic imaging of other parts of digestive tract: Secondary | ICD-10-CM

## 2021-08-17 DIAGNOSIS — R748 Abnormal levels of other serum enzymes: Secondary | ICD-10-CM

## 2021-08-17 DIAGNOSIS — K7581 Nonalcoholic steatohepatitis (NASH): Secondary | ICD-10-CM

## 2021-08-18 LAB — NASH FIBROSURE(R) PLUS
ALPHA 2-MACROGLOBULINS, QN: 183 mg/dL (ref 110–276)
ALT (SGPT) P5P: 105 IU/L — ABNORMAL HIGH (ref 0–40)
AST (SGOT) P5P: 62 IU/L — ABNORMAL HIGH (ref 0–40)
Apolipoprotein A-1: 146 mg/dL (ref 116–209)
Bilirubin, Total: 0.2 mg/dL (ref 0.0–1.2)
Cholesterol, Total: 157 mg/dL (ref 100–199)
Fibrosis Score: 0.12 (ref 0.00–0.21)
GGT: 34 IU/L (ref 0–60)
Glucose: 224 mg/dL — ABNORMAL HIGH (ref 70–99)
Haptoglobin: 90 mg/dL (ref 33–346)
NASH Score: 0.78 — ABNORMAL HIGH (ref 0.00–0.25)
Steatosis Score: 0.91 — ABNORMAL HIGH (ref 0.00–0.40)
Triglycerides: 321 mg/dL — ABNORMAL HIGH (ref 0–149)

## 2021-08-23 LAB — PANCREATIC ELASTASE, FECAL: Pancreatic Elastase-1, Stool: >500 mcg/g

## 2021-08-25 ENCOUNTER — Ambulatory Visit (HOSPITAL_COMMUNITY)
Admission: RE | Admit: 2021-08-25 | Discharge: 2021-08-25 | Disposition: A | Discharge status: Home / Self Care | Payer: BC Managed Care – PPO | Source: Ambulatory Visit | Attending: Gastroenterology | Admitting: Gastroenterology

## 2021-08-25 ENCOUNTER — Encounter (HOSPITAL_COMMUNITY): Payer: Self-pay

## 2021-08-25 DIAGNOSIS — E1169 Type 2 diabetes mellitus with other specified complication: Secondary | ICD-10-CM | POA: Insufficient documentation

## 2021-08-25 DIAGNOSIS — Z8719 Personal history of other diseases of the digestive system: Secondary | ICD-10-CM | POA: Insufficient documentation

## 2021-08-25 DIAGNOSIS — R748 Abnormal levels of other serum enzymes: Secondary | ICD-10-CM | POA: Diagnosis present

## 2021-08-25 DIAGNOSIS — R933 Abnormal findings on diagnostic imaging of other parts of digestive tract: Secondary | ICD-10-CM | POA: Diagnosis present

## 2021-08-25 DIAGNOSIS — K7581 Nonalcoholic steatohepatitis (NASH): Secondary | ICD-10-CM | POA: Diagnosis present

## 2021-08-25 LAB — POCT I-STAT CREATININE: Creatinine, Ser: 1 mg/dL (ref 0.44–1.00)

## 2021-08-25 MED ORDER — IOHEXOL 300 MG/ML  SOLN
100.0000 mL | Freq: Once | INTRAMUSCULAR | Status: AC | PRN
  Administered 2021-08-25: 100 mL via INTRAVENOUS

## 2021-08-25 MED ORDER — SODIUM CHLORIDE (PF) 0.9 % IJ SOLN
INTRAMUSCULAR | Status: AC
  Filled 2021-08-25: qty 50

## 2021-08-31 ENCOUNTER — Encounter: Payer: Self-pay | Admitting: Gastroenterology

## 2021-09-19 ENCOUNTER — Encounter: Payer: Self-pay | Admitting: Internal Medicine

## 2021-09-19 ENCOUNTER — Ambulatory Visit (INDEPENDENT_AMBULATORY_CARE_PROVIDER_SITE_OTHER): Payer: BC Managed Care – PPO | Admitting: Internal Medicine

## 2021-09-19 VITALS — BP 130/78 | HR 85 | Temp 98.0°F | Wt 207.8 lb

## 2021-09-19 DIAGNOSIS — K7581 Nonalcoholic steatohepatitis (NASH): Secondary | ICD-10-CM | POA: Diagnosis not present

## 2021-09-19 DIAGNOSIS — E1169 Type 2 diabetes mellitus with other specified complication: Secondary | ICD-10-CM | POA: Diagnosis not present

## 2021-09-19 DIAGNOSIS — I10 Essential (primary) hypertension: Secondary | ICD-10-CM

## 2021-09-19 DIAGNOSIS — E039 Hypothyroidism, unspecified: Secondary | ICD-10-CM

## 2021-09-19 DIAGNOSIS — Z23 Encounter for immunization: Secondary | ICD-10-CM

## 2021-09-19 DIAGNOSIS — E785 Hyperlipidemia, unspecified: Secondary | ICD-10-CM | POA: Diagnosis not present

## 2021-09-19 LAB — POCT GLYCOSYLATED HEMOGLOBIN (HGB A1C): Hemoglobin A1C: 7.9 % — AB (ref 4.0–5.6)

## 2021-09-19 MED ORDER — FREESTYLE LIBRE 3 SENSOR MISC: 2 refills | Status: DC

## 2021-09-19 MED ORDER — TOUJEO MAX SOLOSTAR 300 UNIT/ML ~~LOC~~ SOPN: 5.0000 [IU] | PEN_INJECTOR | Freq: Every day | SUBCUTANEOUS | 1 refills | Status: DC

## 2021-09-19 NOTE — Addendum Note (Signed)
Addended by: Westley Hummer B on: 09/19/2021 11:05 AM   Modules accepted: Orders

## 2021-09-19 NOTE — Progress Notes (Signed)
Established Patient Office Visit     CC/Reason for Visit: 3 mo follow up chronic conditions  HPI: Krista Dawson is a 56 y.o. female who is coming in today for the above mentioned reasons. Past Medical History is significant for:  Hypertension, hyperlipidemia, type 2 diabetes, hypothyroidism, nonalcoholic steatohepatitis, obesity.  We have been having issues getting her A1c controlled ever since she came off Trulicity due to side effects.  It has further increased to 7.7 today.  She is on maximum dose metformin and [place].  GI put in a referral to endocrine which has not yet happened.  She is hesitant to start insulin but is willing if needed.  She has also been having a chronically elevated lipase with normal CT and endoscopic ultrasound, she has been referred to academic GI at [facility] which will happen in October.   Past Medical/Surgical History: Past Medical History:  Diagnosis Date   Allergy    Asthma    exercise induced - not used inhaler couple of yrs pt reported 10-03-2018   Blood transfusion without reported diagnosis    Colon polyp    Complication of anesthesia    went into Vtach after breast reduction surgery 1988/89   Depression    DM (diabetes mellitus) ([place])    GERD (gastroesophageal reflux disease)    HTN (hypertension)    Hyperlipidemia    Hypothyroidism    NASH (nonalcoholic steatohepatitis)    Obesity (BMI 30.0-34.9)    Post menopausal problems    Sleep apnea    no c-pap   Transaminitis    Vitamin D deficiency     Past Surgical History:  Procedure Laterality Date   ADRENALECTOMY Right    APPENDECTOMY     BIOPSY  12/12/2020   Procedure: BIOPSY;  Surgeon: Rush Landmark Telford Nab., MD;  Location: [facility];  Service: Gastroenterology;;   BREAST REDUCTION SURGERY     CHOLECYSTECTOMY     ESOPHAGOGASTRODUODENOSCOPY N/A 12/12/2020   Procedure: ESOPHAGOGASTRODUODENOSCOPY (EGD);  Surgeon: Irving Copas., MD;  Location: [facility];  Service:  Gastroenterology;  Laterality: N/A;   EUS N/A 12/12/2020   Procedure: UPPER ENDOSCOPIC ULTRASOUND (EUS) LINEAR;  Surgeon: Irving Copas., MD;  Location: [facility];  Service: Gastroenterology;  Laterality: N/A;   HYSTERECTOMY ABDOMINAL WITH SALPINGECTOMY     LIVER BIOPSY  2008, 2013   NASH   TONSILLECTOMY     UPPER GASTROINTESTINAL ENDOSCOPY      Social History:  reports that she quit smoking about 14 years ago. Her smoking use included cigarettes. She has never used smokeless tobacco. She reports current alcohol use. She reports that she does not use drugs.  Allergies: Allergies  Allergen Reactions   Spironolactone Hives and Swelling   Paxil [Paroxetine Hcl] Other (See Comments)    Other reaction(s): Other (comments) WORSENED DEPRESSION. WORSENED DEPRESSION.    Sulfa Antibiotics Hives and Itching   Codeine Itching and Rash    Family History:  Family History  Problem Relation Age of Onset   Breast cancer Mother    Colon polyps Mother    Stroke Mother 51   GER disease Father    Diabetes Father    Kidney disease Father    CAD Father    Other Father        cause of death listed as resp. failure   Colon polyps Sister    Stroke Sister    Diabetes Maternal Grandfather    Heart disease Maternal Grandfather    Diabetes Paternal  Grandmother    CAD Paternal Grandfather    Pancreatic cancer Paternal Uncle    Pancreatic cancer Maternal Uncle    Colon cancer Neg Hx    Esophageal cancer Neg Hx    Stomach cancer Neg Hx    Rectal cancer Neg Hx      Current Outpatient Medications:    aspirin EC 81 MG tablet, Take 81 mg by mouth every evening., Disp: , Rfl:    Blood Glucose Monitoring Suppl (CONTOUR NEXT ONE) KIT, by Does not apply route., Disp: , Rfl:    Calcium-Magnesium (CAL-MAG PO), Take by mouth., Disp: , Rfl:    Cholecalciferol (VITAMIN D3) 50 MCG (2000 UT) TABS, Take 2,000 Units by mouth in the morning., Disp: , Rfl:    dapagliflozin propanediol (FARXIGA) 10  MG TABS tablet, Take by mouth daily., Disp: , Rfl:    hydrochlorothiazide (HYDRODIURIL) 25 MG tablet, TAKE 1 TABLET BY MOUTH DAILY, Disp: 90 tablet, Rfl: 1   insulin glargine, 2 Unit Dial, (TOUJEO MAX SOLOSTAR) 300 UNIT/ML Solostar Pen, Inject 5 Units into the skin at bedtime., Disp: 3 mL, Rfl: 1   levothyroxine (SYNTHROID) 88 MCG tablet, TAKE 1 TABLET BY MOUTH DAILY BEFORE BREAKFAST, Disp: 90 tablet, Rfl: 1   lisinopril (ZESTRIL) 20 MG tablet, TAKE 1 TABLET BY MOUTH DAILY, Disp: 90 tablet, Rfl: 1   Melatonin 10 MG CAPS, Take 10 mg by mouth at bedtime., Disp: , Rfl:    metFORMIN (GLUCOPHAGE) 500 MG tablet, TAKE 2 TABLETS BY MOUTH 2 TIMES DAILY GENERIC EQUIVALENT FOR GLUCOPHAGE, Disp: 360 tablet, Rfl: 1   Microlet Lancets MISC, by Does not apply route. Test 3-4 times daily (Micorlet Colored Lancets), Disp: , Rfl:    pantoprazole (PROTONIX) 40 MG tablet, Take 1 tablet (40 mg total) by mouth daily. Take 30 minutes before breakfast., Disp: 90 tablet, Rfl: 3   PREBIOTIC PRODUCT PO, Take 1 Scoop by mouth daily., Disp: , Rfl:    rosuvastatin (CRESTOR) 10 MG tablet, TAKE 1 TABLET BY MOUTH DAILY, Disp: 90 tablet, Rfl: 1   triamcinolone cream (KENALOG) 0.1 %, Apply 1 application. topically 2 (two) times daily. (Patient taking differently: Apply 1 application  topically 2 (two) times daily. PRN), Disp: 30 g, Rfl: 0   venlafaxine XR (EFFEXOR-XR) 37.5 MG 24 hr capsule, TAKE ONE CAPSULE BY MOUTH DAILY. PLEASE SCHEDULE A PHYSICAL FOR MORE REFILLS., Disp: 90 capsule, Rfl: 0  Review of Systems:  Constitutional: Denies fever, chills, diaphoresis, appetite change and fatigue.  HEENT: Denies photophobia, eye pain, redness, hearing loss, ear pain, congestion, sore throat, rhinorrhea, sneezing, mouth sores, trouble swallowing, neck pain, neck stiffness and tinnitus.   Respiratory: Denies SOB, DOE, cough, chest tightness,  and wheezing.   Cardiovascular: Denies chest pain, palpitations and leg swelling.   Gastrointestinal: Denies nausea, vomiting, abdominal pain, diarrhea, constipation, blood in stool and abdominal distention.  Genitourinary: Denies dysuria, urgency, frequency, hematuria, flank pain and difficulty urinating.  Endocrine: Denies: hot or cold intolerance, sweats, changes in hair or nails, polyuria, polydipsia. Musculoskeletal: Denies myalgias, back pain, joint swelling, arthralgias and gait problem.  Skin: Denies pallor, rash and wound.  Neurological: Denies dizziness, seizures, syncope, weakness, light-headedness, numbness and headaches.  Hematological: Denies adenopathy. Easy bruising, personal or family bleeding history  Psychiatric/Behavioral: Denies suicidal ideation, mood changes, confusion, nervousness, sleep disturbance and agitation    Physical Exam: Vitals:   09/19/21 0913  BP: 130/78  Pulse: 85  Temp: 98 F (36.7 C)  TempSrc: Oral  SpO2: 95%  Weight:  207 lb 12.8 oz (94.3 kg)    Body mass index is 34.05 kg/m.   Constitutional: NAD, calm, comfortable Eyes: PERRL, lids and conjunctivae normal, wears corrective lenses ENMT: Mucous membranes are moist.  Respiratory: clear to auscultation bilaterally, no wheezing, no crackles. Normal respiratory effort. No accessory muscle use.  Cardiovascular: Regular rate and rhythm, no murmurs / rubs / gallops. No extremity edema.   Psychiatric: Normal judgment and insight. Alert and oriented x 3. Normal mood.    Impression and Plan:  Type 2 diabetes mellitus with other specified complication, without long-term current use of insulin ([place]) - Plan: POCT glycosylated hemoglobin (Hb A1C), insulin glargine, 2 Unit Dial, (TOUJEO MAX SOLOSTAR) 300 UNIT/ML Solostar Pen  Needs flu shot - Plan: Flu Vaccine QUAD 6+ mos PF IM (Fluarix Quad PF)  Primary hypertension  Hyperlipidemia, unspecified hyperlipidemia type  NASH (nonalcoholic steatohepatitis)  Hypothyroidism, unspecified type  -A1c has increased to 7.9.  Will send  for continuous glucose monitor as well as start glargine insulin 5 units at bedtime. -She will return in 3 months for follow-up. -Flu vaccine administered today.  Time spent:32 minutes reviewing chart, interviewing and examining patient and formulating plan of care.   Patient Instructions  -Nice seeing you today!!  -Start toujeo 5 units at bedtime.  -GI placed an endocrine referral in August. Please call to arrange appointment. Continue farxiga and metformin as you are.  -See you back in 3 months.   Lelon Frohlich, MD [facility]

## 2021-09-19 NOTE — Patient Instructions (Signed)
-  Nice seeing you today!!  -Start toujeo 5 units at bedtime.  -GI placed an endocrine referral in August. Please call to arrange appointment. Continue farxiga and metformin as you are.  -See you back in 3 months.

## 2021-09-21 ENCOUNTER — Other Ambulatory Visit: Payer: Self-pay

## 2021-09-21 ENCOUNTER — Encounter: Payer: Self-pay | Admitting: Gastroenterology

## 2021-09-21 DIAGNOSIS — Z8719 Personal history of other diseases of the digestive system: Secondary | ICD-10-CM

## 2021-09-21 DIAGNOSIS — R748 Abnormal levels of other serum enzymes: Secondary | ICD-10-CM

## 2021-09-21 DIAGNOSIS — R933 Abnormal findings on diagnostic imaging of other parts of digestive tract: Secondary | ICD-10-CM

## 2021-09-21 DIAGNOSIS — K7581 Nonalcoholic steatohepatitis (NASH): Secondary | ICD-10-CM

## 2021-10-01 ENCOUNTER — Encounter: Payer: Self-pay | Admitting: Internal Medicine

## 2021-10-16 ENCOUNTER — Other Ambulatory Visit: Payer: Self-pay | Admitting: Gastroenterology

## 2021-10-23 ENCOUNTER — Other Ambulatory Visit: Payer: Self-pay | Admitting: Internal Medicine

## 2021-10-23 DIAGNOSIS — E039 Hypothyroidism, unspecified: Secondary | ICD-10-CM

## 2021-10-24 ENCOUNTER — Encounter: Payer: Self-pay | Admitting: Internal Medicine

## 2021-11-03 ENCOUNTER — Ambulatory Visit (INDEPENDENT_AMBULATORY_CARE_PROVIDER_SITE_OTHER): Payer: BC Managed Care – PPO | Admitting: Internal Medicine

## 2021-11-03 ENCOUNTER — Encounter: Payer: Self-pay | Admitting: Internal Medicine

## 2021-11-03 VITALS — BP 114/72 | HR 84 | Ht 65.5 in | Wt 211.0 lb

## 2021-11-03 DIAGNOSIS — E1165 Type 2 diabetes mellitus with hyperglycemia: Secondary | ICD-10-CM | POA: Insufficient documentation

## 2021-11-03 DIAGNOSIS — E1169 Type 2 diabetes mellitus with other specified complication: Secondary | ICD-10-CM

## 2021-11-03 LAB — POCT GLUCOSE (DEVICE FOR HOME USE): POC Glucose: 169 mg/dl — AB (ref 70–99)

## 2021-11-03 MED ORDER — INSULIN PEN NEEDLE 32G X 4 MM MISC: 1.0000 | Freq: Every day | 3 refills | Status: DC

## 2021-11-03 MED ORDER — DAPAGLIFLOZIN PROPANEDIOL 10 MG PO TABS: 10.0000 mg | ORAL_TABLET | Freq: Every day | ORAL | 3 refills | Status: DC

## 2021-11-03 MED ORDER — METFORMIN HCL ER 500 MG PO TB24: 1000.0000 mg | ORAL_TABLET | Freq: Two times a day (BID) | ORAL | 3 refills | Status: DC

## 2021-11-03 MED ORDER — TOUJEO MAX SOLOSTAR 300 UNIT/ML ~~LOC~~ SOPN: 10.0000 [IU] | PEN_INJECTOR | Freq: Every day | SUBCUTANEOUS | 1 refills | Status: DC

## 2021-11-03 NOTE — Progress Notes (Signed)
Name: Krista Dawson  MRN/ DOB: [id number], 1965/11/13   Age/ Sex: 56 y.o., female    PCP: Isaac Bliss, Rayford Halsted, MD   Reason for Endocrinology Evaluation: Type 2 Diabetes Mellitus     Date of Initial Endocrinology Visit: 11/03/2021     PATIENT IDENTIFIER: Krista Dawson is a 56 y.o. female with a past medical history of HTN, NASH, Hx pancreatitis, and hypothyroidism. The patient presented for initial endocrinology clinic visit on 11/03/2021 for consultative assistance with her diabetes management.    HPI: Krista Dawson was    Diagnosed with DM 2005 Prior Medications tried/Intolerance: trulicity 1.5 mg  - abdominal pain with elevated lipase but no pancreatitis diagnosis . Insulin started 08/2021 Currently checking blood sugars 1 x / day Hypoglycemia episodes : no                Hemoglobin A1c has ranged from 6.0% in 2022, peaking at 8.6% in 2020.  The patient was on Trulicity until June 4332 when she developed severe abdominal pain, her lipase has been consistently elevated but there was no evidence of pancreatitis Due to persistent elevation in lipase she was seen by [facility] gastroenterology    In terms of diet, the patient 1 meal a day, patient endorses lack of appetite but at the same time she endorses carbohydrate cravings  Has prescription for freestyle libre through PCP   She has morning nausea  Has family history of pancreatic cancer  Both parents with thyroid  disease     HOME DIABETES REGIMEN: Metformin 500 mg 2 tabs BID  Farxiga 10 mg daily  Toujeo 5 units daily daily    Statin: yes ACE-I/ARB: yes    METER DOWNLOAD SUMMARY: did not bring    DIABETIC COMPLICATIONS: Microvascular complications:  cataract Denies: CKD , retinopathy, neuropathy  Last eye exam: Completed 05/2021  Macrovascular complications:   Denies: CAD, PVD, CVA   PAST HISTORY: Past Medical History:  Past Medical History:  Diagnosis Date   Allergy    Asthma     exercise induced - not used inhaler couple of yrs pt reported 10-03-2018   Blood transfusion without reported diagnosis    Colon polyp    Complication of anesthesia    went into Vtach after breast reduction surgery 1988/89   Depression    DM (diabetes mellitus) ([place])    GERD (gastroesophageal reflux disease)    HTN (hypertension)    Hyperlipidemia    Hypothyroidism    NASH (nonalcoholic steatohepatitis)    Obesity (BMI 30.0-34.9)    Post menopausal problems    Sleep apnea    no c-pap   Transaminitis    Vitamin D deficiency    Past Surgical History:  Past Surgical History:  Procedure Laterality Date   ADRENALECTOMY Right    APPENDECTOMY     BIOPSY  12/12/2020   Procedure: BIOPSY;  Surgeon: Rush Landmark Telford Nab., MD;  Location: [facility];  Service: Gastroenterology;;   BREAST REDUCTION SURGERY     CHOLECYSTECTOMY     ESOPHAGOGASTRODUODENOSCOPY N/A 12/12/2020   Procedure: ESOPHAGOGASTRODUODENOSCOPY (EGD);  Surgeon: Irving Copas., MD;  Location: [facility];  Service: Gastroenterology;  Laterality: N/A;   EUS N/A 12/12/2020   Procedure: UPPER ENDOSCOPIC ULTRASOUND (EUS) LINEAR;  Surgeon: Irving Copas., MD;  Location: [facility];  Service: Gastroenterology;  Laterality: N/A;   HYSTERECTOMY ABDOMINAL WITH SALPINGECTOMY     LIVER BIOPSY  2008, 2013   NASH   TONSILLECTOMY     UPPER GASTROINTESTINAL ENDOSCOPY  Social History:  reports that she quit smoking about 14 years ago. Her smoking use included cigarettes. She has never used smokeless tobacco. She reports current alcohol use. She reports that she does not use drugs. Family History:  Family History  Problem Relation Age of Onset   Breast cancer Mother    Colon polyps Mother    Stroke Mother 4   GER disease Father    Diabetes Father    Kidney disease Father    CAD Father    Other Father        cause of death listed as resp. failure   Colon polyps Sister    Stroke Sister    Diabetes  Maternal Grandfather    Heart disease Maternal Grandfather    Diabetes Paternal Grandmother    CAD Paternal Grandfather    Pancreatic cancer Paternal Uncle    Pancreatic cancer Maternal Uncle    Colon cancer Neg Hx    Esophageal cancer Neg Hx    Stomach cancer Neg Hx    Rectal cancer Neg Hx      HOME MEDICATIONS: Allergies as of 11/03/2021       Reactions   Spironolactone Hives, Swelling   Paxil [paroxetine Hcl] Other (See Comments)   Other reaction(s): Other (comments) WORSENED DEPRESSION. WORSENED DEPRESSION.   Sulfa Antibiotics Hives, Itching   Codeine Itching, Rash        Medication List        Accurate as of November 03, 2021 10:04 AM. If you have any questions, ask your nurse or doctor.          STOP taking these medications    Contour Next One Kit Stopped by: Dorita Sciara, MD       TAKE these medications    aspirin EC 81 MG tablet Take 81 mg by mouth every evening.   CAL-MAG PO Take by mouth.   Farxiga 10 MG Tabs tablet Generic drug: dapagliflozin propanediol Take by mouth daily.   FreeStyle Libre 3 Sensor Misc Place 1 sensor on the skin every 14 days. Use to check glucose continuously   hydrochlorothiazide 25 MG tablet Commonly known as: HYDRODIURIL TAKE 1 TABLET BY MOUTH DAILY   levothyroxine 88 MCG tablet Commonly known as: SYNTHROID TAKE 1 TABLET BY MOUTH DAILY BEFORE BREAKFAST.   lisinopril 20 MG tablet Commonly known as: ZESTRIL TAKE 1 TABLET BY MOUTH DAILY   Melatonin 10 MG Caps Take 10 mg by mouth at bedtime.   metFORMIN 500 MG tablet Commonly known as: GLUCOPHAGE TAKE 2 TABLETS BY MOUTH 2 TIMES DAILY. GENERIC EQUIVALENT FOR GLUCOPHAGE   Microlet Lancets Misc by Does not apply route. Test 3-4 times daily (Micorlet Colored Lancets)   pantoprazole 40 MG tablet Commonly known as: PROTONIX TAKE 1 TABLET(40 MG) BY MOUTH DAILY 30 MINUTES BEFORE BREAKFAST   PREBIOTIC PRODUCT PO Take 1 Scoop by mouth daily.    rosuvastatin 10 MG tablet Commonly known as: CRESTOR TAKE 1 TABLET BY MOUTH DAILY   Toujeo Max SoloStar 300 UNIT/ML Solostar Pen Generic drug: insulin glargine (2 Unit Dial) Inject 5 Units into the skin at bedtime.   triamcinolone cream 0.1 % Commonly known as: KENALOG Apply 1 application. topically 2 (two) times daily. What changed: additional instructions   venlafaxine XR 37.5 MG 24 hr capsule Commonly known as: EFFEXOR-XR TAKE ONE CAPSULE BY MOUTH DAILY. PLEASE SCHEDULE A PHYSICAL FOR MORE REFILLS.   Vitamin D3 50 MCG (2000 UT) Tabs Take 2,000 Units by mouth in the  morning.         ALLERGIES: Allergies  Allergen Reactions   Spironolactone Hives and Swelling   Paxil [Paroxetine Hcl] Other (See Comments)    Other reaction(s): Other (comments) WORSENED DEPRESSION. WORSENED DEPRESSION.    Sulfa Antibiotics Hives and Itching   Codeine Itching and Rash     REVIEW OF SYSTEMS: A comprehensive ROS was conducted with the patient and is negative except as per HPI    OBJECTIVE:   VITAL SIGNS: BP 114/72 (BP Location: Left Arm, Patient Position: Sitting, Cuff Size: Large)   Pulse 84   Ht 5' 5.5" (1.664 m)   Wt 211 lb (95.7 kg)   SpO2 95%   BMI 34.58 kg/m    PHYSICAL EXAM:  General: Pt appears well and is in NAD  Neck: General: Supple without adenopathy or carotid bruits. Thyroid: Thyroid size normal.  No goiter or nodules appreciated.   Lungs: Clear with good BS bilat with no rales, rhonchi, or wheezes  Heart: RRR   Abdomen:  soft, nontender  Extremities:  Lower extremities - No pretibial edema. No lesions.  Neuro: MS is good with appropriate affect, pt is alert and Ox3    DM foot exam: 11/03/2021  The skin of the feet is intact without sores or ulcerations. The pedal pulses are 2+ on right and 2+ on left. The sensation is intact to a screening 5.07, 10 gram monofilament bilaterally     DATA REVIEWED:  Lab Results  Component Value Date   HGBA1C 7.9  (A) 09/19/2021   HGBA1C 7.7 (A) 06/22/2021   HGBA1C 7.7 06/22/2021   Lab Results  Component Value Date   MICROALBUR 1.2 06/22/2021   LDLCALC 60 12/23/2020   CREATININE 1.00 08/25/2021   Lab Results  Component Value Date   MICRALBCREAT 3.2 06/22/2021    Lab Results  Component Value Date   CHOL 157 08/16/2021   HDL 37.60 (L) 06/22/2021   LDLCALC 60 12/23/2020   LDLDIRECT 62.0 06/22/2021   TRIG 321 (H) 08/16/2021   CHOLHDL 3 06/22/2021        ASSESSMENT / PLAN / RECOMMENDATIONS:   1) Type 2 Diabetes Mellitus, Sub-Optimally controlled, Without  complications - Most recent A1c of 7.9 %. Goal A1c < 7.0 %.    Plan: GENERAL: I have discussed with the patient the pathophysiology of diabetes. We went over the natural progression of the disease. We stressed the importance of lifestyle changes. I explained the complications associated with diabetes including retinopathy, nephropathy, neuropathy as well as increased risk of cardiovascular disease. We went over the benefit seen with glycemic control.  I explained to the patient that diabetic patients are at higher than normal risk for amputations.  Given prolonged lipase and GI  issues, she is not a canddiate for GLP-1 agonists, DPP-4 inhibitors or mounjaro , we discussed increased risk of pancreatitis Will check GAD-65 and islet cell antibody We discussed the importance of avoiding sugar sweetened beverages, avoiding snacks, and eating proper meals when hungry I will increase her insulin as below I will switch her metformin to XR formulation to see if that would help with her nausea She was referred to our CDE She was encouraged to start using freestyle libre  MEDICATIONS: Increase Toujeo to 8-10 units daily-she will start with 8 units a day if BG is consistently over 150, she will increase to 10 units daily Continue metformin 500 mg XR 2 tabs BID Continue Farxiga 10 mg daily  EDUCATION / INSTRUCTIONS: BG monitoring  instructions:  Patient is instructed to check her blood sugars  3 times a day, before meals . Call [facility] if: BG persistently < 70  I reviewed the Rule of 15 for the treatment of hypoglycemia in detail with the patient. Literature supplied.   2) Diabetic complications:  Eye: Does not have known diabetic retinopathy.  Neuro/ Feet: Does not have known diabetic peripheral neuropathy. Renal: Patient does not have known baseline CKD. She is  on an ACEI/ARB at present.       Signed electronically by: Mack Guise, MD  [facility]  [facility] [address] [place], [place] [address] Phone: [phone] FAX: [phone]   CC: Isaac Bliss, Rayford Halsted, MD [place] [address] Phone: [phone]  Fax: [phone]    Return to Endocrinology clinic as below: Future Appointments  Date Time Provider [place]  11/03/2021 10:10 AM Jahquez Steffler, Melanie Crazier, MD [facility] None  12/21/2021  9:00 AM Isaac Bliss, Rayford Halsted, MD [facility]

## 2021-11-03 NOTE — Patient Instructions (Addendum)
Increase Toujeo 10 units daily  Continue Metformin 500 mg XR 2 tablets twice daily  Continue Farxiga 10 mg daily    HOW TO TREAT LOW BLOOD SUGARS (Blood sugar LESS THAN 70 MG/DL) Please follow the RULE OF 15 for the treatment of hypoglycemia treatment (when your (blood sugars are less than 70 mg/dL)   STEP 1: Take 15 grams of carbohydrates when your blood sugar is low, which includes:  3-4 GLUCOSE TABS  OR 3-4 OZ OF JUICE OR REGULAR SODA OR ONE TUBE OF GLUCOSE GEL    STEP 2: RECHECK blood sugar in 15 MINUTES STEP 3: If your blood sugar is still low at the 15 minute recheck --> then, go back to STEP 1 and treat AGAIN with another 15 grams of carbohydrates.

## 2021-11-06 ENCOUNTER — Other Ambulatory Visit: Payer: Self-pay

## 2021-11-06 ENCOUNTER — Encounter: Payer: Self-pay | Admitting: Internal Medicine

## 2021-11-06 MED ORDER — CONTOUR NEXT TEST VI STRP: ORAL_STRIP | 12 refills | Status: AC

## 2021-11-16 LAB — GLUTAMIC ACID DECARBOXYLASE AUTO ABS: Glutamic Acid Decarb Ab: <5 IU/mL (ref <5)

## 2021-11-16 LAB — ISLET CELL AB SCREEN RFLX TO TITER: ISLET CELL ANTIBODY SCREEN: NEGATIVE

## 2021-11-27 LAB — HM MAMMOGRAPHY

## 2021-12-04 ENCOUNTER — Encounter: Payer: Self-pay | Admitting: Internal Medicine

## 2021-12-04 ENCOUNTER — Telehealth (INDEPENDENT_AMBULATORY_CARE_PROVIDER_SITE_OTHER): Payer: BC Managed Care – PPO | Admitting: Internal Medicine

## 2021-12-04 VITALS — Temp 100.5°F | Wt 207.0 lb

## 2021-12-04 DIAGNOSIS — U071 COVID-19: Secondary | ICD-10-CM | POA: Diagnosis not present

## 2021-12-04 MED ORDER — MOLNUPIRAVIR EUA 200MG CAPSULE: 4.0000 | ORAL_CAPSULE | Freq: Two times a day (BID) | ORAL | 0 refills | Status: AC

## 2021-12-04 NOTE — Progress Notes (Signed)
Virtual Visit via Video Note  I connected with Krista Dawson on 12/04/21 at  3:30 PM EST by a video enabled telemedicine application and verified that I am speaking with the correct person using two identifiers.  Location patient: home Location provider: work office Persons participating in the virtual visit: patient, provider  I discussed the limitations of evaluation and management by telemedicine and the availability of in person appointments. The patient expressed understanding and agreed to proceed.   HPI: She has scheduled this visit to inform me that she tested positive for COVID this morning.  For Thanksgiving she got together with family, Friday evening she went out to play bingo with many unknown people.  Last night she had some nasal congestion.  This morning she had significant chills and myalgias as well as postnasal drip.  Her temperature was 100.5 after ibuprofen.  She has not had any shortness of breath.  She took a test this morning that resulted positive.   ROS: Constitutional: Positive for fever, chills, diaphoresis, appetite change and fatigue.  HEENT: Denies photophobia, eye pain, redness,  mouth sores, trouble swallowing, neck pain, neck stiffness and tinnitus.   Respiratory: Denies SOB, DOE,chest tightness,  and wheezing.   Cardiovascular: Denies chest pain, palpitations and leg swelling.  Gastrointestinal: Denies nausea, vomiting, abdominal pain, diarrhea, constipation, blood in stool and abdominal distention.  Genitourinary: Denies dysuria, urgency, frequency, hematuria, flank pain and difficulty urinating.  Endocrine: Denies: hot or cold intolerance, sweats, changes in hair or nails, polyuria, polydipsia. Musculoskeletal: Denies myalgias, back pain, joint swelling, arthralgias and gait problem.  Skin: Denies pallor, rash and wound.  Neurological: Denies dizziness, seizures, syncope, weakness, light-headedness, numbness and headaches.  Hematological:  Denies adenopathy. Easy bruising, personal or family bleeding history  Psychiatric/Behavioral: Denies suicidal ideation, mood changes, confusion, nervousness, sleep disturbance and agitation   Past Medical History:  Diagnosis Date   Allergy    Asthma    exercise induced - not used inhaler couple of yrs pt reported 10-03-2018   Blood transfusion without reported diagnosis    Colon polyp    Complication of anesthesia    went into Vtach after breast reduction surgery 1988/89   Depression    DM (diabetes mellitus) ([facility])    GERD (gastroesophageal reflux disease)    HTN (hypertension)    Hyperlipidemia    Hypothyroidism    NASH (nonalcoholic steatohepatitis)    Obesity (BMI 30.0-34.9)    Post menopausal problems    Sleep apnea    no c-pap   Transaminitis    Vitamin D deficiency     Past Surgical History:  Procedure Laterality Date   ADRENALECTOMY Right    APPENDECTOMY     BIOPSY  12/12/2020   Procedure: BIOPSY;  Surgeon: Rush Landmark Telford Nab., MD;  Location: [facility];  Service: Gastroenterology;;   BREAST REDUCTION SURGERY     CHOLECYSTECTOMY     ESOPHAGOGASTRODUODENOSCOPY N/A 12/12/2020   Procedure: ESOPHAGOGASTRODUODENOSCOPY (EGD);  Surgeon: Irving Copas., MD;  Location: [facility];  Service: Gastroenterology;  Laterality: N/A;   EUS N/A 12/12/2020   Procedure: UPPER ENDOSCOPIC ULTRASOUND (EUS) LINEAR;  Surgeon: Irving Copas., MD;  Location: [facility];  Service: Gastroenterology;  Laterality: N/A;   HYSTERECTOMY ABDOMINAL WITH SALPINGECTOMY     LIVER BIOPSY  2008, 2013   NASH   TONSILLECTOMY     UPPER GASTROINTESTINAL ENDOSCOPY      Family History  Problem Relation Age of Onset   Breast cancer Mother  Colon polyps Mother    Stroke Mother 40   GER disease Father    Diabetes Father    Kidney disease Father    CAD Father    Other Father        cause of death listed as resp. failure   Colon polyps Sister    Stroke Sister    Diabetes  Maternal Grandfather    Heart disease Maternal Grandfather    Diabetes Paternal Grandmother    CAD Paternal Grandfather    Pancreatic cancer Paternal Uncle    Pancreatic cancer Maternal Uncle    Colon cancer Neg Hx    Esophageal cancer Neg Hx    Stomach cancer Neg Hx    Rectal cancer Neg Hx     SOCIAL HX:   reports that she quit smoking about 14 years ago. Her smoking use included cigarettes. She has never used smokeless tobacco. She reports current alcohol use. She reports that she does not use drugs.   Current Outpatient Medications:    aspirin EC 81 MG tablet, Take 81 mg by mouth every evening., Disp: , Rfl:    Calcium-Magnesium (CAL-MAG PO), Take by mouth., Disp: , Rfl:    Cholecalciferol (VITAMIN D3) 50 MCG (2000 UT) TABS, Take 2,000 Units by mouth in the morning., Disp: , Rfl:    Continuous Blood Gluc Sensor (FREESTYLE LIBRE 3 SENSOR) MISC, Place 1 sensor on the skin every 14 days. Use to check glucose continuously, Disp: 6 each, Rfl: 2   dapagliflozin propanediol (FARXIGA) 10 MG TABS tablet, Take 1 tablet (10 mg total) by mouth daily., Disp: 90 tablet, Rfl: 3   glucose blood (CONTOUR NEXT TEST) test strip, Check blood sugar 1 time daily, Disp: 100 each, Rfl: 12   hydrochlorothiazide (HYDRODIURIL) 25 MG tablet, TAKE 1 TABLET BY MOUTH DAILY, Disp: 90 tablet, Rfl: 1   insulin glargine, 2 Unit Dial, (TOUJEO MAX SOLOSTAR) 300 UNIT/ML Solostar Pen, Inject 10 Units into the skin at bedtime., Disp: 15 mL, Rfl: 1   Insulin Pen Needle 32G X 4 MM MISC, 1 Device by Does not apply route daily in the afternoon., Disp: 100 each, Rfl: 3   levothyroxine (SYNTHROID) 88 MCG tablet, TAKE 1 TABLET BY MOUTH DAILY BEFORE BREAKFAST., Disp: 90 tablet, Rfl: 1   lisinopril (ZESTRIL) 20 MG tablet, TAKE 1 TABLET BY MOUTH DAILY, Disp: 90 tablet, Rfl: 1   Melatonin 10 MG CAPS, Take 10 mg by mouth at bedtime., Disp: , Rfl:    metFORMIN (GLUCOPHAGE-XR) 500 MG 24 hr tablet, Take 2 tablets (1,000 mg total) by mouth  2 (two) times daily with a meal., Disp: 360 tablet, Rfl: 3   Microlet Lancets MISC, by Does not apply route. Test 3-4 times daily (Micorlet Colored Lancets), Disp: , Rfl:    molnupiravir EUA (LAGEVRIO) 200 mg CAPS capsule, Take 4 capsules (800 mg total) by mouth 2 (two) times daily for 5 days., Disp: 40 capsule, Rfl: 0   pantoprazole (PROTONIX) 40 MG tablet, TAKE 1 TABLET(40 MG) BY MOUTH DAILY 30 MINUTES BEFORE BREAKFAST, Disp: 30 tablet, Rfl: 11   rosuvastatin (CRESTOR) 10 MG tablet, TAKE 1 TABLET BY MOUTH DAILY, Disp: 90 tablet, Rfl: 1   triamcinolone cream (KENALOG) 0.1 %, Apply 1 application. topically 2 (two) times daily. (Patient taking differently: Apply 1 application  topically 2 (two) times daily. PRN), Disp: 30 g, Rfl: 0   venlafaxine XR (EFFEXOR-XR) 37.5 MG 24 hr capsule, TAKE ONE CAPSULE BY MOUTH DAILY. PLEASE SCHEDULE A PHYSICAL FOR  MORE REFILLS., Disp: 90 capsule, Rfl: 0  EXAM:   VITALS per patient if applicable: Tmax of 427.0  GENERAL: alert, oriented, appears well and in no acute distress, sounds congested  HEENT: atraumatic, conjunttiva clear, no obvious abnormalities on inspection of external nose and ears  NECK: normal movements of the head and neck  LUNGS: on inspection no signs of respiratory distress, breathing rate appears normal, no obvious gross increased work of breathing, gasping or wheezing  CV: no obvious cyanosis  MS: moves all visible extremities without noticeable abnormality  PSYCH/NEURO: pleasant and cooperative, no obvious depression or anxiety, speech and thought processing grossly intact  ASSESSMENT AND PLAN:   COVID-19 - Plan: molnupiravir EUA (LAGEVRIO) 200 mg CAPS capsule -She may also use OTC medications such as antihistamines, decongestants, pain relievers, guaifenesin. -We have reviewed quarantine period of 5 days. -We have discussed symptoms that would promote ED evaluation. -She knows to follow with [place] if symptoms fail to resolve.      I discussed the assessment and treatment plan with the patient. The patient was provided an opportunity to ask questions and all were answered. The patient agreed with the plan and demonstrated an understanding of the instructions.   The patient was advised to call back or seek an in-person evaluation if the symptoms worsen or if the condition fails to improve as anticipated.    Lelon Frohlich, MD  [facility]

## 2021-12-11 ENCOUNTER — Encounter: Payer: BC Managed Care – PPO | Attending: Internal Medicine | Admitting: Dietician

## 2021-12-11 ENCOUNTER — Encounter: Payer: Self-pay | Admitting: Dietician

## 2021-12-11 VITALS — Ht 65.5 in | Wt 207.0 lb

## 2021-12-11 DIAGNOSIS — E119 Type 2 diabetes mellitus without complications: Secondary | ICD-10-CM | POA: Insufficient documentation

## 2021-12-11 NOTE — Progress Notes (Signed)
Diabetes Self-Management Education  Visit Type: First/Initial  Appt. Start Time: 0850 Appt. End Time: 1572  12/11/2021  [name], identified by name and date of birth, is a 56 y.o. female with a diagnosis of Diabetes: Type 2.   ASSESSMENT Patient is here today alone. She would like a review of education and focus on what is correct information.  Referral:  Type 2 Diabetes History includes:  Type 2 Diabetes (2005), pancreatitis, hypothyroidism, HTN, vitamin D deficiency, OSA (mild per patient and does not require c-pap), HLD, NASH Medications include:  Farxiga, Toujeo 10 units q HS, Metformin XR, melatonin, Omega 3, magnesium, vitamin D Labs include:  A1C 7.9% 09/19/2021, 7.7% 06/22/2021 CGM:  FreeStyle Libre 3 Sleep:  fair 6.5 hours per fit bit Exercise:  4,000 steps per day per fit bit.  Walks at times and rare tennis. Has a gym membership but doesn't go.  CGM Results from download:   % Time CGM active:   92 %   (Goal >70%)  Average glucose:   160 mg/dL for 7 days  Glucose management indicator:   7.1 %  Time in range (70-180 mg/dL):   77 %   (Goal >70%)  Time High (181-250 mg/dL):   21 %   (Goal < 25%)  Time Very High (>250 mg/dL):    2 %   (Goal < 5%)  Time Low (54-69 mg/dL):   0 %   (Goal <4%)  Time Very Low (<54 mg/dL):   0 %   (Goal <1%)   65.5" 207 lbs 12/11/2021 Keto 2020 and lost 25 lbs  regained when she came off keto  Patient lives alone.  Doesn't cook much. 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  Increased amounts of travel related to work since 05/2021. Moved here from [place] in 2020. Father has a lot of vascular problems, renal issues and is an amputee.  Height 5' 5.5" (1.664 m), weight 207 lb (93.9 kg). Body mass index is 33.92 kg/m.   Diabetes Self-Management Education - 12/11/21 0920       Visit Information   Visit Type First/Initial      Initial Visit   Diabetes Type Type 2    Date Diagnosed 2005    Are you  currently following a meal plan? No    Are you taking your medications as prescribed? Yes      Health Coping   How would you rate your overall health? Good      Psychosocial Assessment   Patient Belief/Attitude about Diabetes Motivated to manage diabetes    What is the hardest part about your diabetes right now, causing you the most concern, or is the most worrisome to you about your diabetes?   Making healty food and beverage choices;Being active    Self-care barriers None    Self-management support Doctor's office    Other persons present Patient    Patient Concerns Nutrition/Meal planning;Healthy Lifestyle    Special Needs None    Preferred Learning Style No preference indicated    Learning Readiness Ready    How often do you need to have someone help you when you read instructions, pamphlets, or other written materials from your doctor or pharmacy? 1 - Never    What is the last grade level you completed in school? Associate's degree      Pre-Education Assessment   Patient understands the diabetes disease and treatment process. Needs Instruction    Patient understands incorporating nutritional  management into lifestyle. Needs Instruction    Patient undertands incorporating physical activity into lifestyle. Needs Instruction    Patient understands using medications safely. Needs Instruction    Patient understands monitoring blood glucose, interpreting and using results Needs Instruction    Patient understands prevention, detection, and treatment of acute complications. Needs Instruction    Patient understands prevention, detection, and treatment of chronic complications. Needs Instruction    Patient understands how to develop strategies to address psychosocial issues. Needs Instruction    Patient understands how to develop strategies to promote health/change behavior. Needs Instruction      Complications   Last HgB A1C per patient/outside source 7.9 %   09/19/2021, 7.7% 06/22/2021    How often do you check your blood sugar? 0 times/day (not testing)    Fasting Blood glucose range (mg/dL) 130-179    Postprandial Blood glucose range (mg/dL) 130-179;180-200    Number of hypoglycemic episodes per month 0    Number of hyperglycemic episodes ( >213m/dL): Occasional    Can you tell when your blood sugar is high? Yes    Have you had a dilated eye exam in the past 12 months? Yes    Have you had a dental exam in the past 12 months? Yes    Are you checking your feet? Yes    How many days per week are you checking your feet? 2      Dietary Intake   Breakfast skips OR egg bites from [employer] OR low sugar instant oatmeal OR eggs, bacon, white toast    Snack (morning) none    Lunch t[place]wrap   out to eat   Snack (afternoon) none    Dinner 2 slices of pizza OR lettuce wraps from [employer] OR habachi steak and shrimp, rice    Snack (evening) none    Beverage(s) coffee with half and half, water, rare coke zero, occasional zero tea      Activity / Exercise   Activity / Exercise Type Light (walking / raking leaves)    How many days per week do you exercise? 1    How many minutes per day do you exercise? 45    Total minutes per week of exercise 45      Patient Education   Previous Diabetes Education Yes (please comment)   2005   Disease Pathophysiology Definition of diabetes, type 1 and 2, and the diagnosis of diabetes    Healthy Eating Plate Method;Food label reading, portion sizes and measuring food.;Role of diet in the treatment of diabetes and the relationship between the three main macronutrients and blood glucose level;Meal options for control of blood glucose level and chronic complications.    Being Active Role of exercise on diabetes management, blood pressure control and cardiac health.    Medications Reviewed patients medication for diabetes, action, purpose, timing of dose and side effects.    Monitoring Taught/evaluated CGM (comment);Identified appropriate SMBG  and/or A1C goals.;Daily foot exams;Yearly dilated eye exam    Acute complications Taught prevention, symptoms, and  treatment of hypoglycemia - the 15 rule.    Chronic complications Relationship between chronic complications and blood glucose control;Retinopathy and reason for yearly dilated eye exams      Individualized Goals (developed by patient)   Nutrition General guidelines for healthy choices and portions discussed    Physical Activity Exercise 3-5 times per week;30 minutes per day    Medications take my medication as prescribed    Monitoring  Consistenly use CGM  Problem Solving Eating Pattern;Addressing barriers to behavior change      Post-Education Assessment   Patient understands the diabetes disease and treatment process. Comprehends key points    Patient understands incorporating nutritional management into lifestyle. Comprehends key points    Patient undertands incorporating physical activity into lifestyle. Comprehends key points    Patient understands using medications safely. Comphrehends key points    Patient understands monitoring blood glucose, interpreting and using results Comprehends key points    Patient understands prevention, detection, and treatment of acute complications. Comprehends key points    Patient understands prevention, detection, and treatment of chronic complications. Comprehends key points    Patient understands how to develop strategies to address psychosocial issues. Comprehends key points    Patient understands how to develop strategies to promote health/change behavior. Needs Review      Outcomes   Expected Outcomes Demonstrated interest in learning. Expect positive outcomes    Future DMSE 2 months    Program Status Not Completed             Individualized Plan for Diabetes Self-Management Training:   Learning Objective:  Patient will have a greater understanding of diabetes self-management. Patient education plan is to attend  individual and/or group sessions per assessed needs and concerns.   Plan:   Patient Instructions  Aim to be more active.  30 minutes 5 days per week is the goal.  Walking is great!  Listen to a podcast or book on tape.  Enjoy a class.  Consider [employer].  Make a smart goal  Make it easy to succeed  Keep groceries in your home for simple meals  Mindfulness:  Consistently scheduled meal - avoid skipping  Choices  Eat slowly  Away from distraction (sitting in kitchen or dining room)  Stop eating when satisfied  Before a snack ask, "Am I hungry or eating for another reason?"   "What can I do instead if I am not hungry?"  Try to find something every day that brings you joy!  The Type 2 Diabetes Revolution: A Cookbook and Complete Guide to Managing Type 2 Diabetes Hardcover - November 14, 4172 by Lawson Radar (Author), Kendell Bane (Author)  Aim for 2-3 Carb Choices per meal (30-45 grams) +/- 1 either way  Include protein in moderation with your meals and snacks Consider reading food labels for Total Carbohydrate of foods  "Eat more like your grandparents"     Expected Outcomes:  Demonstrated interest in learning. Expect positive outcomes  Education material provided: ADA - How to Thrive: A Guide for Your Journey with Diabetes, Food label handouts, Meal plan card, My Plate, and Diabetes Resources, Dining out tips  If problems or questions, patient to contact team via:  Phone  Future DSME appointment: 2 months

## 2021-12-11 NOTE — Patient Instructions (Addendum)
Aim to be more active.  30 minutes 5 days per week is the goal.  Walking is great!  Listen to a podcast or book on tape.  Enjoy a class.  Consider [employer].  Make a smart goal  Make it easy to succeed  Keep groceries in your home for simple meals  Calorie [employer] app  Mindfulness:  Consistently scheduled meal - avoid skipping  Choices  Eat slowly  Away from distraction (sitting in kitchen or dining room)  Stop eating when satisfied  Before a snack ask, "Am I hungry or eating for another reason?"   "What can I do instead if I am not hungry?"  Try to find something every day that brings you joy!  The Type 2 Diabetes Revolution: A Cookbook and Complete Guide to Managing Type 2 Diabetes Hardcover - November 15, 7626 by Lawson Radar (Author), Kendell Bane (Author)  Aim for 2-3 Carb Choices per meal (30-45 grams) +/- 1 either way  Include protein in moderation with your meals and snacks Consider reading food labels for Total Carbohydrate of foods  "Eat more like your grandparents"

## 2021-12-21 ENCOUNTER — Encounter: Payer: Self-pay | Admitting: Internal Medicine

## 2021-12-21 ENCOUNTER — Ambulatory Visit (INDEPENDENT_AMBULATORY_CARE_PROVIDER_SITE_OTHER): Payer: BC Managed Care – PPO | Admitting: Internal Medicine

## 2021-12-21 VITALS — BP 110/70 | HR 82 | Temp 98.2°F | Wt 208.3 lb

## 2021-12-21 DIAGNOSIS — E1169 Type 2 diabetes mellitus with other specified complication: Secondary | ICD-10-CM | POA: Diagnosis not present

## 2021-12-21 DIAGNOSIS — E039 Hypothyroidism, unspecified: Secondary | ICD-10-CM

## 2021-12-21 DIAGNOSIS — E785 Hyperlipidemia, unspecified: Secondary | ICD-10-CM

## 2021-12-21 DIAGNOSIS — I10 Essential (primary) hypertension: Secondary | ICD-10-CM | POA: Diagnosis not present

## 2021-12-21 DIAGNOSIS — K7581 Nonalcoholic steatohepatitis (NASH): Secondary | ICD-10-CM

## 2021-12-21 DIAGNOSIS — E669 Obesity, unspecified: Secondary | ICD-10-CM

## 2021-12-21 LAB — POCT GLYCOSYLATED HEMOGLOBIN (HGB A1C): Hemoglobin A1C: 7.9 % — AB (ref 4.0–5.6)

## 2021-12-21 MED ORDER — DAPAGLIFLOZIN PROPANEDIOL 10 MG PO TABS: 10.0000 mg | ORAL_TABLET | Freq: Every day | ORAL | 1 refills | Status: DC

## 2021-12-21 NOTE — Progress Notes (Signed)
Established Patient Office Visit     CC/Reason for Visit: Follow-up chronic conditions  HPI: Krista Dawson is a 56 y.o. female who is coming in today for the above mentioned reasons. Past Medical History is significant for: Hypertension, hyperlipidemia, type 2 diabetes, hypothyroidism, nonalcoholic steatohepatitis, obesity.  She is requesting refills of her [place].  She has now started seeing endocrinology.  She is feeling well and has no acute concerns or complaints.  She recovered well from [place] that she had over Thanksgiving, she did take molnupiravir for this.   Past Medical/Surgical History: Past Medical History:  Diagnosis Date   Allergy    Asthma    exercise induced - not used inhaler couple of yrs pt reported 10-03-2018   Blood transfusion without reported diagnosis    Colon polyp    Complication of anesthesia    went into Vtach after breast reduction surgery 1988/89   Depression    DM (diabetes mellitus) ([place])    GERD (gastroesophageal reflux disease)    HTN (hypertension)    Hyperlipidemia    Hypothyroidism    NASH (nonalcoholic steatohepatitis)    Obesity (BMI 30.0-34.9)    Post menopausal problems    Sleep apnea    no c-pap   Transaminitis    Vitamin D deficiency     Past Surgical History:  Procedure Laterality Date   ADRENALECTOMY Right    APPENDECTOMY     BIOPSY  12/12/2020   Procedure: BIOPSY;  Surgeon: Rush Landmark Telford Nab., MD;  Location: [facility];  Service: Gastroenterology;;   BREAST REDUCTION SURGERY     CHOLECYSTECTOMY     ESOPHAGOGASTRODUODENOSCOPY N/A 12/12/2020   Procedure: ESOPHAGOGASTRODUODENOSCOPY (EGD);  Surgeon: Irving Copas., MD;  Location: [facility];  Service: Gastroenterology;  Laterality: N/A;   EUS N/A 12/12/2020   Procedure: UPPER ENDOSCOPIC ULTRASOUND (EUS) LINEAR;  Surgeon: Irving Copas., MD;  Location: [facility];  Service: Gastroenterology;  Laterality: N/A;   HYSTERECTOMY ABDOMINAL WITH  SALPINGECTOMY     LIVER BIOPSY  2008, 2013   NASH   TONSILLECTOMY     UPPER GASTROINTESTINAL ENDOSCOPY      Social History:  reports that she quit smoking about 14 years ago. Her smoking use included cigarettes. She has never used smokeless tobacco. She reports current alcohol use. She reports that she does not use drugs.  Allergies: Allergies  Allergen Reactions   Trulicity [Dulaglutide]     Pancreatitis    Spironolactone Hives and Swelling   Paxil [Paroxetine Hcl] Other (See Comments)    Other reaction(s): Other (comments) WORSENED DEPRESSION. WORSENED DEPRESSION.    Sulfa Antibiotics Hives and Itching   Codeine Itching and Rash    Family History:  Family History  Problem Relation Age of Onset   Breast cancer Mother    Colon polyps Mother    Stroke Mother 24   GER disease Father    Diabetes Father    Kidney disease Father    CAD Father    Other Father        cause of death listed as resp. failure   Colon polyps Sister    Stroke Sister    Diabetes Maternal Grandfather    Heart disease Maternal Grandfather    Diabetes Paternal Grandmother    CAD Paternal Grandfather    Pancreatic cancer Paternal Uncle    Pancreatic cancer Maternal Uncle    Colon cancer Neg Hx    Esophageal cancer Neg Hx    Stomach cancer Neg Hx  Rectal cancer Neg Hx      Current Outpatient Medications:    aspirin EC 81 MG tablet, Take 81 mg by mouth every evening., Disp: , Rfl:    Calcium-Magnesium (CAL-MAG PO), Take by mouth., Disp: , Rfl:    Cholecalciferol (VITAMIN D3) 50 MCG (2000 UT) TABS, Take 2,000 Units by mouth in the morning., Disp: , Rfl:    Continuous Blood Gluc Sensor (FREESTYLE LIBRE 3 SENSOR) MISC, Place 1 sensor on the skin every 14 days. Use to check glucose continuously, Disp: 6 each, Rfl: 2   glucose blood (CONTOUR NEXT TEST) test strip, Check blood sugar 1 time daily, Disp: 100 each, Rfl: 12   hydrochlorothiazide (HYDRODIURIL) 25 MG tablet, TAKE 1 TABLET BY MOUTH DAILY,  Disp: 90 tablet, Rfl: 1   insulin glargine, 2 Unit Dial, (TOUJEO MAX SOLOSTAR) 300 UNIT/ML Solostar Pen, Inject 10 Units into the skin at bedtime., Disp: 15 mL, Rfl: 1   Insulin Pen Needle 32G X 4 MM MISC, 1 Device by Does not apply route daily in the afternoon., Disp: 100 each, Rfl: 3   levothyroxine (SYNTHROID) 88 MCG tablet, TAKE 1 TABLET BY MOUTH DAILY BEFORE BREAKFAST., Disp: 90 tablet, Rfl: 1   lisinopril (ZESTRIL) 20 MG tablet, TAKE 1 TABLET BY MOUTH DAILY, Disp: 90 tablet, Rfl: 1   magnesium chloride (SLOW-MAG) 64 MG TBEC SR tablet, Take 1 tablet by mouth daily., Disp: , Rfl:    Melatonin 10 MG CAPS, Take 10 mg by mouth at bedtime., Disp: , Rfl:    metFORMIN (GLUCOPHAGE-XR) 500 MG 24 hr tablet, Take 2 tablets (1,000 mg total) by mouth 2 (two) times daily with a meal., Disp: 360 tablet, Rfl: 3   Microlet Lancets MISC, by Does not apply route. Test 3-4 times daily (Micorlet Colored Lancets), Disp: , Rfl:    Omega-3 Fatty Acids (OMEGA 3 500 PO), Take 1,000 mg by mouth., Disp: , Rfl:    pantoprazole (PROTONIX) 40 MG tablet, TAKE 1 TABLET(40 MG) BY MOUTH DAILY 30 MINUTES BEFORE BREAKFAST, Disp: 30 tablet, Rfl: 11   rosuvastatin (CRESTOR) 10 MG tablet, TAKE 1 TABLET BY MOUTH DAILY, Disp: 90 tablet, Rfl: 1   triamcinolone cream (KENALOG) 0.1 %, Apply 1 application. topically 2 (two) times daily. (Patient taking differently: Apply 1 application  topically 2 (two) times daily. PRN), Disp: 30 g, Rfl: 0   venlafaxine XR (EFFEXOR-XR) 37.5 MG 24 hr capsule, TAKE ONE CAPSULE BY MOUTH DAILY. PLEASE SCHEDULE A PHYSICAL FOR MORE REFILLS., Disp: 90 capsule, Rfl: 0   dapagliflozin propanediol (FARXIGA) 10 MG TABS tablet, Take 1 tablet (10 mg total) by mouth daily., Disp: 90 tablet, Rfl: 1  Review of Systems:  Constitutional: Denies fever, chills, diaphoresis, appetite change and fatigue.  HEENT: Denies photophobia, eye pain, redness, hearing loss, ear pain, congestion, sore throat, rhinorrhea, sneezing, mouth  sores, trouble swallowing, neck pain, neck stiffness and tinnitus.   Respiratory: Denies SOB, DOE, cough, chest tightness,  and wheezing.   Cardiovascular: Denies chest pain, palpitations and leg swelling.  Gastrointestinal: Denies nausea, vomiting, abdominal pain, diarrhea, constipation, blood in stool and abdominal distention.  Genitourinary: Denies dysuria, urgency, frequency, hematuria, flank pain and difficulty urinating.  Endocrine: Denies: hot or cold intolerance, sweats, changes in hair or nails, polyuria, polydipsia. Musculoskeletal: Denies myalgias, back pain, joint swelling, arthralgias and gait problem.  Skin: Denies pallor, rash and wound.  Neurological: Denies dizziness, seizures, syncope, weakness, light-headedness, numbness and headaches.  Hematological: Denies adenopathy. Easy bruising, personal or family bleeding history  Psychiatric/Behavioral: Denies suicidal ideation, mood changes, confusion, nervousness, sleep disturbance and agitation    Physical Exam: Vitals:   12/21/21 0853  BP: 110/70  Pulse: 82  Temp: 98.2 F (36.8 C)  TempSrc: Oral  SpO2: 98%  Weight: 208 lb 4.8 oz (94.5 kg)    Body mass index is 34.14 kg/m.   Constitutional: NAD, calm, comfortable Eyes: PERRL, lids and conjunctivae normal, wears corrective lenses ENMT: Mucous membranes are moist.  Respiratory: clear to auscultation bilaterally, no wheezing, no crackles. Normal respiratory effort. No accessory muscle use.  Cardiovascular: Regular rate and rhythm, no murmurs / rubs / gallops. No extremity edema.  Psychiatric: Normal judgment and insight. Alert and oriented x 3. Normal mood.    Impression and Plan:  Type 2 diabetes mellitus with other specified complication, without long-term current use of insulin ([place]) - Plan: POCT glycosylated hemoglobin (Hb A1C), dapagliflozin propanediol (FARXIGA) 10 MG TABS tablet  Primary hypertension  Hyperlipidemia, unspecified hyperlipidemia  type  Hypothyroidism, unspecified type  NASH (nonalcoholic steatohepatitis)  Obesity (BMI 30.0-34.9)  -A1c today is 7.9.  She has started seeing endocrinology and they are slowly increasing her basal insulin.  I have reviewed her freestyle libre monitor today, she has not been hypoglycemic and her A1c is slowly improving.  She has been 82% of time in target.  Continued management by endocrinology. -Blood pressure is well-controlled. -As of last check LDL was at goal of 60.  Time spent:32 minutes reviewing chart, interviewing and examining patient and formulating plan of care.      Lelon Frohlich, MD [facility]

## 2021-12-29 ENCOUNTER — Ambulatory Visit: Payer: BC Managed Care – PPO | Admitting: Dietician

## 2022-01-18 ENCOUNTER — Encounter: Payer: Self-pay | Admitting: Internal Medicine

## 2022-01-18 ENCOUNTER — Telehealth: Payer: Self-pay

## 2022-01-18 ENCOUNTER — Encounter: Payer: Self-pay | Admitting: Gastroenterology

## 2022-01-18 MED ORDER — HYDROCHLOROTHIAZIDE 25 MG PO TABS: 25.0000 mg | ORAL_TABLET | Freq: Every day | ORAL | 1 refills | Status: DC

## 2022-01-18 MED ORDER — LISINOPRIL 20 MG PO TABS: 20.0000 mg | ORAL_TABLET | Freq: Every day | ORAL | 1 refills | Status: DC

## 2022-01-18 NOTE — Telephone Encounter (Signed)
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  Given these results she should have a colonoscopy every 5 years.  Thank you.  KLB

## 2022-01-19 NOTE — Telephone Encounter (Signed)
Recall previously placed.

## 2022-01-26 ENCOUNTER — Other Ambulatory Visit (HOSPITAL_COMMUNITY): Payer: Self-pay

## 2022-01-26 ENCOUNTER — Telehealth: Payer: Self-pay | Admitting: Pharmacy Technician

## 2022-01-26 NOTE — Telephone Encounter (Signed)
PA has been approved and pharmacy has been called to process.

## 2022-01-26 NOTE — Telephone Encounter (Signed)
Patient Advocate Encounter  Prior Authorization for PANTOPRAZOLE '40MG'$  has been approved.    PA# [id number] Effective dates: 1.19.24 through 1.18.27    Received notification from [facility] that prior authorization for PANTOPRAZOLE '40MG'$  is required.   PA submitted on 1.19.24 Key [id number] Status is pending

## 2022-02-05 ENCOUNTER — Encounter: Payer: Self-pay | Admitting: Internal Medicine

## 2022-02-06 MED ORDER — DEXCOM G7 SENSOR MISC: 1.0000 | Freq: Every day | 12 refills | Status: DC

## 2022-02-28 ENCOUNTER — Encounter: Payer: Self-pay | Admitting: Internal Medicine

## 2022-03-01 ENCOUNTER — Other Ambulatory Visit: Payer: Self-pay

## 2022-03-01 MED ORDER — INSULIN PEN NEEDLE 32G X 4 MM MISC: 1.0000 | Freq: Every day | 4 refills | Status: DC

## 2022-03-09 ENCOUNTER — Ambulatory Visit (INDEPENDENT_AMBULATORY_CARE_PROVIDER_SITE_OTHER): Payer: No Typology Code available for payment source | Admitting: Internal Medicine

## 2022-03-09 ENCOUNTER — Encounter: Payer: Self-pay | Admitting: Internal Medicine

## 2022-03-09 ENCOUNTER — Encounter: Payer: No Typology Code available for payment source | Attending: Internal Medicine | Admitting: Dietician

## 2022-03-09 VITALS — Wt 205.0 lb

## 2022-03-09 VITALS — BP 120/78 | HR 98 | Ht 65.5 in | Wt 205.6 lb

## 2022-03-09 DIAGNOSIS — E1165 Type 2 diabetes mellitus with hyperglycemia: Secondary | ICD-10-CM

## 2022-03-09 DIAGNOSIS — E119 Type 2 diabetes mellitus without complications: Secondary | ICD-10-CM | POA: Insufficient documentation

## 2022-03-09 LAB — POCT GLYCOSYLATED HEMOGLOBIN (HGB A1C): Hemoglobin A1C: 7.5 % — AB (ref 4.0–5.6)

## 2022-03-09 NOTE — Progress Notes (Signed)
Name: Tenita Orth  MRN/ DOB: [id number], 14-Nov-1965   Age/ Sex: 57 y.o., female    PCP: Isaac Bliss, Rayford Halsted, MD   Reason for Endocrinology Evaluation: Type 2 Diabetes Mellitus     Date of Initial Endocrinology Visit: 11/03/2021    PATIENT IDENTIFIER: Ms. Ares Grodin is a 57 y.o. female with a past medical history of HTN, NASH, Hx pancreatitis, and hypothyroidism. The patient presented for initial endocrinology clinic visit on 11/03/2021  for consultative assistance with her diabetes management.    HPI: Ms. Whiton was    Diagnosed with DM 2005 Prior Medications tried/Intolerance: trulicity 1.5 mg  - abdominal pain with elevated lipase but no pancreatitis diagnosis . Insulin started 08/2021          Hemoglobin A1c has ranged from 6.0% in 2022, peaking at 8.6% in 2020.  The patient was on Trulicity until June [id number] when she developed severe abdominal pain, her lipase has been consistently elevated but there was no evidence of pancreatitis Due to persistent elevation in lipase she was seen by [facility]     Has family history of pancreatic cancer  Both parents with thyroid  disease    On her initial visit to our clinic she had an A1c of 7.9% she was on basal insulin, metformin, and Farxiga which we adjusted  GAD-65 and islet cell antibody were undetectable  SUBJECTIVE:   During the last visit (11/03/2021): A1c 7.9%  Today (03/09/22): Ms. Parayno is here for follow-up on diabetes management.  She checks her blood sugars multiple times daily. The patient has not had hypoglycemic episodes since the last clinic visit.  Patient follows with GI for NASH She met with our CDE 12/11/2021  Nausea has improved  No changes in bowel movements  HOME DIABETES REGIMEN: Metformin 500 mg XR  2 tabs BID  Farxiga 10 mg daily  Toujeo 12 units daily daily    Statin: yes ACE-I/ARB: yes    CONTINUOUS GLUCOSE MONITORING RECORD INTERPRETATION    Dates of  Recording: 2/17-03/09/2022  Sensor description:dexcom  Results statistics:   CGM use % of time 93  Average and SD 166/34  Time in range     70   %  % Time Above 180 27  % Time above 250 2  % Time Below target <1   Glycemic patterns summary: BG's are mostly optimal overnight and trend upwards intermittently during the day  Hyperglycemic episodes postprandial  Hypoglycemic episodes occurred once at night  Overnight periods: Variable    DIABETIC COMPLICATIONS: Microvascular complications:  cataract Denies: CKD , retinopathy, neuropathy  Last eye exam: Completed 05/2021  Macrovascular complications:   Denies: CAD, PVD, CVA   PAST HISTORY: Past Medical History:  Past Medical History:  Diagnosis Date   Allergy    Asthma    exercise induced - not used inhaler couple of yrs pt reported 10-03-2018   Blood transfusion without reported diagnosis    Colon polyp    Complication of anesthesia    went into Vtach after breast reduction surgery 1988/89   Depression    DM (diabetes mellitus) ([place])    GERD (gastroesophageal reflux disease)    HTN (hypertension)    Hyperlipidemia    Hypothyroidism    NASH (nonalcoholic steatohepatitis)    Obesity (BMI 30.0-34.9)    Post menopausal problems    Sleep apnea    no c-pap   Transaminitis    Vitamin D deficiency    Past Surgical History:  Past Surgical  History:  Procedure Laterality Date   ADRENALECTOMY Right    APPENDECTOMY     BIOPSY  12/12/2020   Procedure: BIOPSY;  Surgeon: Rush Landmark Telford Nab., MD;  Location: [facility];  Service: Gastroenterology;;   BREAST REDUCTION SURGERY     CHOLECYSTECTOMY     ESOPHAGOGASTRODUODENOSCOPY N/A 12/12/2020   Procedure: ESOPHAGOGASTRODUODENOSCOPY (EGD);  Surgeon: Irving Copas., MD;  Location: [facility];  Service: Gastroenterology;  Laterality: N/A;   EUS N/A 12/12/2020   Procedure: UPPER ENDOSCOPIC ULTRASOUND (EUS) LINEAR;  Surgeon: Irving Copas., MD;  Location:  [facility];  Service: Gastroenterology;  Laterality: N/A;   HYSTERECTOMY ABDOMINAL WITH SALPINGECTOMY     LIVER BIOPSY  2008, 2013   NASH   TONSILLECTOMY     UPPER GASTROINTESTINAL ENDOSCOPY      Social History:  reports that she quit smoking about 15 years ago. Her smoking use included cigarettes. She has never used smokeless tobacco. She reports current alcohol use. She reports that she does not use drugs. Family History:  Family History  Problem Relation Age of Onset   Breast cancer Mother    Colon polyps Mother    Stroke Mother 75   GER disease Father    Diabetes Father    Kidney disease Father    CAD Father    Other Father        cause of death listed as resp. failure   Colon polyps Sister    Stroke Sister    Diabetes Maternal Grandfather    Heart disease Maternal Grandfather    Diabetes Paternal Grandmother    CAD Paternal Grandfather    Pancreatic cancer Paternal Uncle    Pancreatic cancer Maternal Uncle    Colon cancer Neg Hx    Esophageal cancer Neg Hx    Stomach cancer Neg Hx    Rectal cancer Neg Hx      HOME MEDICATIONS: Allergies as of 03/09/2022       Reactions   Trulicity [dulaglutide]    Pancreatitis   Spironolactone Hives, Swelling   Paxil [paroxetine Hcl] Other (See Comments)   Other reaction(s): Other (comments) WORSENED DEPRESSION. WORSENED DEPRESSION.   Sulfa Antibiotics Hives, Itching   Codeine Itching, Rash        Medication List        Accurate as of March 09, 2022 12:10 PM. If you have any questions, ask your nurse or doctor.          aspirin EC 81 MG tablet Take 81 mg by mouth every evening.   CAL-MAG PO Take by mouth.   Contour Next Test test strip Generic drug: glucose blood Check blood sugar 1 time daily   dapagliflozin propanediol 10 MG Tabs tablet Commonly known as: Farxiga Take 1 tablet (10 mg total) by mouth daily.   Dexcom G7 Sensor Misc 1 each by Does not apply route daily.   hydrochlorothiazide 25 MG  tablet Commonly known as: HYDRODIURIL Take 1 tablet (25 mg total) by mouth daily.   Insulin Pen Needle 32G X 4 MM Misc 1 Device by Does not apply route daily in the afternoon.   levothyroxine 88 MCG tablet Commonly known as: SYNTHROID TAKE 1 TABLET BY MOUTH DAILY BEFORE BREAKFAST.   lisinopril 20 MG tablet Commonly known as: ZESTRIL Take 1 tablet (20 mg total) by mouth daily.   magnesium chloride 64 MG Tbec SR tablet Commonly known as: SLOW-MAG Take 1 tablet by mouth daily.   Melatonin 10 MG Caps Take 10 mg by  mouth at bedtime.   metFORMIN 500 MG 24 hr tablet Commonly known as: GLUCOPHAGE-XR Take 2 tablets (1,000 mg total) by mouth 2 (two) times daily with a meal.   Microlet Lancets Misc by Does not apply route. Test 3-4 times daily (Micorlet Colored Lancets)   OMEGA 3 500 PO Take 1,000 mg by mouth.   pantoprazole 40 MG tablet Commonly known as: PROTONIX TAKE 1 TABLET(40 MG) BY MOUTH DAILY 30 MINUTES BEFORE BREAKFAST   rosuvastatin 10 MG tablet Commonly known as: CRESTOR TAKE 1 TABLET BY MOUTH DAILY   Toujeo Max SoloStar 300 UNIT/ML Solostar Pen Generic drug: insulin glargine (2 Unit Dial) Inject 10 Units into the skin at bedtime. What changed: how much to take   triamcinolone cream 0.1 % Commonly known as: KENALOG Apply 1 application. topically 2 (two) times daily. What changed: additional instructions   venlafaxine XR 37.5 MG 24 hr capsule Commonly known as: EFFEXOR-XR TAKE ONE CAPSULE BY MOUTH DAILY. PLEASE SCHEDULE A PHYSICAL FOR MORE REFILLS.   Vitamin D3 50 MCG (2000 UT) Tabs Generic drug: Cholecalciferol Take 2,000 Units by mouth in the morning.         ALLERGIES: Allergies  Allergen Reactions   Trulicity [Dulaglutide]     Pancreatitis    Spironolactone Hives and Swelling   Paxil [Paroxetine Hcl] Other (See Comments)    Other reaction(s): Other (comments) WORSENED DEPRESSION. WORSENED DEPRESSION.    Sulfa Antibiotics Hives and Itching    Codeine Itching and Rash     REVIEW OF SYSTEMS: A comprehensive ROS was conducted with the patient and is negative except as per HPI    OBJECTIVE:   VITAL SIGNS: BP 120/78 (BP Location: Left Arm, Patient Position: Sitting, Cuff Size: Normal)   Pulse 98   Ht 5' 5.5" (1.664 m)   Wt 205 lb 9.6 oz (93.3 kg)   SpO2 99%   BMI 33.69 kg/m    PHYSICAL EXAM:  General: Pt appears well and is in NAD  Neck: General: Supple without adenopathy or carotid bruits. Thyroid: Thyroid size normal.  No goiter or nodules appreciated.   Lungs: Clear with good BS bilat with no rales, rhonchi, or wheezes  Heart: RRR   Abdomen:  soft, nontender  Extremities:  Lower extremities - No pretibial edema. No lesions.  Neuro: MS is good with appropriate affect, pt is alert and Ox3    DM foot exam: 11/03/2021  The skin of the feet is intact without sores or ulcerations. The pedal pulses are 2+ on right and 2+ on left. The sensation is intact to a screening 5.07, 10 gram monofilament bilaterally     DATA REVIEWED:  Lab Results  Component Value Date   HGBA1C 7.5 (A) 03/09/2022   HGBA1C 7.9 (A) 12/21/2021   HGBA1C 7.9 (A) 09/19/2021   Lab Results  Component Value Date   MICROALBUR 1.2 06/22/2021   LDLCALC 60 12/23/2020   CREATININE 1.00 08/25/2021   Lab Results  Component Value Date   MICRALBCREAT 3.2 06/22/2021    Lab Results  Component Value Date   CHOL 157 08/16/2021   HDL 37.60 (L) 06/22/2021   LDLCALC 60 12/23/2020   LDLDIRECT 62.0 06/22/2021   TRIG 321 (H) 08/16/2021   CHOLHDL 3 06/22/2021        ASSESSMENT / PLAN / RECOMMENDATIONS:   1) Type 2 Diabetes Mellitus, poorly controlled, Without  complications - Most recent A1c of 7.5 %. Goal A1c < 7.0 %.    -A1c has trended down  from 7.9% to 7.5% - Given prolonged lipase and GI  issues, she is not a canddiate for GLP-1 agonists, DPP-4 inhibitors or mounjaro , we discussed increased risk of pancreatitis -GAD-65 and islet cell  antibody negative  -She did have 1 episode of hypoglycemia but this is believed to be an accurate, if she confirms to hypoglycemia or if these continue to happen frequently, patient was advised to reduce Toujeo to 10 units -She will continue to work on lifestyle changes -We may consider repaglinide before supper if she continues with severe hyperglycemia after the meal  MEDICATIONS: Continue Toujeo 12 units daily Continue metformin 500 mg XR 2 tabs BID Continue Farxiga 10 mg daily  EDUCATION / INSTRUCTIONS: BG monitoring instructions: Patient is instructed to check her blood sugars  3 times a day, before meals . Call [facility] if: BG persistently < 70  I reviewed the Rule of 15 for the treatment of hypoglycemia in detail with the patient. Literature supplied.   2) Diabetic complications:  Eye: Does not have known diabetic retinopathy.  Neuro/ Feet: Does not have known diabetic peripheral neuropathy. Renal: Patient does not have known baseline CKD. She is  on an ACEI/ARB at present.    Follow-up in 6 months   Signed electronically by: Mack Guise, MD  [facility]  [facility] [address] [place], [place] [address] Phone: [phone] FAX: [phone]   CC: Isaac Bliss, Rayford Halsted, MD [place] [address] Phone: [phone]  Fax: [phone]    Return to Endocrinology clinic as below: Future Appointments  Date Time Provider [place]  03/09/2022  1:00 PM Clydell Hakim, RD [facility]  06/20/2022  9:00 AM Isaac Bliss, Rayford Halsted, MD [id number]

## 2022-03-09 NOTE — Patient Instructions (Addendum)
Avoid trans fats Reduce saturated fat Avoid high fructose corn syrup  Sweeten with stevia or monk fruit if desired.  Breakfast ideas: Consider resuming your smoothie (greek yogurt, hemp or flax seeds, spinach, berries) - consider meal prepping this (freeze everything but the greek yogurt)  Lunch ideas: Large salad for lunch with a lean chicken, tuna, or beans. Soup  (Homemade, [employer], or other quality soup) Open faced tuna sandwich on Ezekiel bread, raw vegetables, fresh fruit Cottage cheese and fruit Homemade "pizza" made on low carb tortilla or wrap, salad  Dinner: Meal plan  Mindful choices when eating out Food is fuel  HALTB  - Hungry, angry, lonely, tired, bored??  Non-food reward - "time" Social connection - Tennis spring league, bible study, occasionally going into the office. Continue to stay active - consider [employer], airport walking! Adequate sleep  Meal Delivery options:  (frozen, preprepared, or kits) [employer]  (advertises to see if you qualify for insurance coverage of the meals) [employer]com  [phone] (25% off with cod RD25 and pop up for 50% off) Whole [email or web address][employer]com.cy (Whole foods plant based) Hungry Root Purple Carrot (vegan) Other companies can be found on line

## 2022-03-09 NOTE — Patient Instructions (Addendum)
Continue  Toujeo 12 units daily  Continue Metformin 500 mg XR 2 tablets twice daily  Continue Farxiga 10 mg daily    HOW TO TREAT LOW BLOOD SUGARS (Blood sugar LESS THAN 70 MG/DL) Please follow the RULE OF 15 for the treatment of hypoglycemia treatment (when your (blood sugars are less than 70 mg/dL)   STEP 1: Take 15 grams of carbohydrates when your blood sugar is low, which includes:  3-4 GLUCOSE TABS  OR 3-4 OZ OF JUICE OR REGULAR SODA OR ONE TUBE OF GLUCOSE GEL    STEP 2: RECHECK blood sugar in 15 MINUTES STEP 3: If your blood sugar is still low at the 15 minute recheck --> then, go back to STEP 1 and treat AGAIN with another 15 grams of carbohydrates.

## 2022-03-09 NOTE — Progress Notes (Signed)
Diabetes Self-Management Education  Visit Type: Follow-up  Appt. Start Time: 1300 Appt. End Time: [id number]  03/12/2022  Krista Dawson, identified by name and date of birth, is a 57 y.o. female with a diagnosis of Diabetes: Type 2.   ASSESSMENT Patient is here today alone.  She was last seen by this RD on 12/11/2021.  Fasting sensor reading 125-130 She is using Factor 75 meals.  Reviewed labels.  Some contain trans fat and are very high in fat and saturated fat.  Emailed list of alternative options and recommended to avoid any of the beef entrees from Factor [employer] as they contain trans fat and are highest in saturated fat. Discussed emotional eating and relationship with food including non-food rewards.  Referral:  Type 2 Diabetes History includes:  Type 2 Diabetes (2005), pancreatitis, hypothyroidism, HTN, vitamin D deficiency, OSA (mild per patient and does not require c-pap), HLD, NASH Medications include:  Farxiga, Toujeo 12 units q HS, Metformin XR, melatonin, Omega 3, magnesium, vitamin D Labs include:  A1C 7.5% 03/09/2022  7.9% 09/19/2021, 7.7% 06/22/2021 CGM:  Dexcom G7 Sleep:  fair 6.5 hours per fit bit Exercise:  4,000 steps per day per fit bit.  Walks at times and rare tennis. Has a gym membership but doesn't go.   CGM Results from download:  12/11/2021 Elenor Legato) 03/09/2022 (Dexcom G7)  % Time CGM active:   92 %   (Goal >70%) 100%  Average glucose:   160 mg/dL for 7 days 166 mg/dL for 14 days  Glucose management indicator:   7.1 % 7.3%  Time in range (70-180 mg/dL):   77 %   (Goal >70%) 70  Time High (181-250 mg/dL):   21 %   (Goal < 25%) 27  Time Very High (>250 mg/dL):    2 %   (Goal < 5%) 2  Time Low (54-69 mg/dL):   0 %   (Goal <4%) <1  Time Very Low (<54 mg/dL):   0 %   (Goal <1%) 0    65.5" 205 lbs 03/08/2021 207 lbs 12/11/2021 Keto 2020 and lost 25 lbs  regained when she came off keto   Patient lives alone.  Doesn't cook much.  Not a lot of time. 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  Increased amounts of travel related to work since 05/2021. Moved here from [place] in 2020. Exercise 2-3 days per week - walking, tennis Father has a lot of vascular problems, renal issues and is an amputee.   Weight 205 lb (93 kg). Body mass index is 33.59 kg/m.   Diabetes Self-Management Education - 03/12/22 1000       Visit Information   Visit Type Follow-up      Initial Visit   Diabetes Type Type 2    Are you currently following a meal plan? No    Are you taking your medications as prescribed? Yes      Psychosocial Assessment   Patient Belief/Attitude about Diabetes Motivated to manage diabetes    What is the hardest part about your diabetes right now, causing you the most concern, or is the most worrisome to you about your diabetes?   Making healty food and beverage choices;Being active    Self-care barriers None    Self-management support Doctor's office;CDE visits    Other persons present Patient    Patient Concerns Nutrition/Meal planning    Special Needs None    Preferred Learning Style No  preference indicated    Learning Readiness Ready    How often do you need to have someone help you when you read instructions, pamphlets, or other written materials from your doctor or pharmacy? 1 - Never      Pre-Education Assessment   Patient understands incorporating nutritional management into lifestyle. Needs Review    Patient undertands incorporating physical activity into lifestyle. Comprehends key points    Patient understands using medications safely. Demonstrates understanding / competency    Patient understands monitoring blood glucose, interpreting and using results Demonstrates understanding / competency    Patient understands prevention, detection, and treatment of acute complications. Demonstrates understanding / competency    Patient understands prevention, detection, and treatment of chronic  complications. Demonstrates understanding / competency    Patient understands how to develop strategies to address psychosocial issues. Demonstrates understanding / competency    Patient understands how to develop strategies to promote health/change behavior. Needs Review      Complications   Last HgB A1C per patient/outside source 7.5 %   03/09/2022   How often do you check your blood sugar? > 4 times/day    Fasting Blood glucose range (mg/dL) 70-129    Postprandial Blood glucose range (mg/dL) 180-200;130-179    Number of hypoglycemic episodes per month 1    Can you tell when your blood sugar is low? Yes    What do you do if your blood sugar is low? juice    Number of hyperglycemic episodes ( >'200mg'$ /dL): Weekly    Can you tell when your blood sugar is high? Yes      Dietary Intake   Breakfast skips but likes smoothie with flax or hemp seeds, greek yogurt, spinach, berries    Lunch Tomato-goat cheese cavatappi    Dinner Spicy pablano beef OR Fusilli and ground pork tomato ragu    Beverage(s) Water, occasional coke zero, coffee with half and half      Patient Education   Previous Diabetes Education Yes (please comment)    Healthy Eating Meal options for control of blood glucose level and chronic complications.;Food label reading, portion sizes and measuring food.    Being Active Role of exercise on diabetes management, blood pressure control and cardiac health.    Medications Reviewed patients medication for diabetes, action, purpose, timing of dose and side effects.    Acute complications Taught prevention, symptoms, and  treatment of hypoglycemia - the 15 rule.    Diabetes Stress and Support Identified and addressed patients feelings and concerns about diabetes      Individualized Goals (developed by patient)   Nutrition General guidelines for healthy choices and portions discussed    Physical Activity Exercise 3-5 times per week;45 minutes per day    Medications take my medication as  prescribed    Monitoring  Consistenly use CGM    Problem Solving Eating Pattern    Reducing Risk treat hypoglycemia with 15 grams of carbs if blood glucose less than '70mg'$ /dL;examine blood glucose patterns;do foot checks daily      Patient Self-Evaluation of Goals - Patient rates self as meeting previously set goals (% of time)   Nutrition 50 - 75 % (half of the time)    Physical Activity 25 - 50% (sometimes)    Medications >75% (most of the time)    Monitoring >75% (most of the time)    Problem Solving and behavior change strategies  50 - 75 % (half of the time)    Reducing Risk (treating  acute and chronic complications) 50 - 75 % (half of the time)    Health Coping >75% (most of the time)      Post-Education Assessment   Patient understands the diabetes disease and treatment process. Demonstrates understanding / competency    Patient understands incorporating nutritional management into lifestyle. Comprehends key points    Patient undertands incorporating physical activity into lifestyle. Comprehends key points    Patient understands using medications safely. Demonstrates understanding / competency    Patient understands monitoring blood glucose, interpreting and using results Demonstrates understanding / competency    Patient understands prevention, detection, and treatment of acute complications. Demonstrates understanding / competency    Patient understands prevention, detection, and treatment of chronic complications. Demonstrates understanding / competency    Patient understands how to develop strategies to address psychosocial issues. Demonstrates understanding / competency    Patient understands how to develop strategies to promote health/change behavior. Comprehends key points      Outcomes   Expected Outcomes Demonstrated interest in learning. Expect positive outcomes    Future DMSE 3-4 months    Program Status Not Completed      Subsequent Visit   Since your last visit have  you continued or begun to take your medications as prescribed? Yes    Since your last visit have you experienced any weight changes? Loss    Weight Loss (lbs) 2             Individualized Plan for Diabetes Self-Management Training:   Learning Objective:  Patient will have a greater understanding of diabetes self-management. Patient education plan is to attend individual and/or group sessions per assessed needs and concerns.   Plan:   Patient Instructions  Avoid trans fats Reduce saturated fat Avoid high fructose corn syrup  Sweeten with stevia or monk fruit if desired.  Breakfast ideas: Consider resuming your smoothie (greek yogurt, hemp or flax seeds, spinach, berries) - consider meal prepping this (freeze everything but the greek yogurt)  Lunch ideas: Large salad for lunch with a lean chicken, tuna, or beans. Soup  (Homemade, [employer], or other quality soup) Open faced tuna sandwich on Ezekiel bread, raw vegetables, fresh fruit Cottage cheese and fruit Homemade "pizza" made on low carb tortilla or wrap, salad  Dinner: Meal plan  Mindful choices when eating out Food is fuel  HALTB  - Hungry, angry, lonely, tired, bored??  Non-food reward - "time" Social connection - Tennis spring league, bible study, occasionally going into the office. Continue to stay active - consider [employer], airport walking! Adequate sleep  Meal Delivery options:  (frozen, preprepared, or kits) [employer]com  (advertises to see if you qualify for insurance coverage of the meals) [employer].com  [phone] (25% off with cod RD25 and pop up for 50% off) Whole [email or web address]cy (Whole foods plant based) Hungry Root Purple Carrot (vegan) Other companies can be found on line      Expected Outcomes:  Demonstrated interest in learning. Expect positive outcomes  Education material provided:   If problems or questions, patient to contact team via:  Phone  Future DSME  appointment: 3-4 months

## 2022-03-17 ENCOUNTER — Other Ambulatory Visit: Payer: Self-pay | Admitting: Internal Medicine

## 2022-03-17 DIAGNOSIS — E039 Hypothyroidism, unspecified: Secondary | ICD-10-CM

## 2022-03-18 ENCOUNTER — Encounter: Payer: Self-pay | Admitting: Internal Medicine

## 2022-03-18 ENCOUNTER — Other Ambulatory Visit: Payer: Self-pay | Admitting: Internal Medicine

## 2022-03-18 DIAGNOSIS — E1169 Type 2 diabetes mellitus with other specified complication: Secondary | ICD-10-CM

## 2022-03-19 ENCOUNTER — Other Ambulatory Visit: Payer: Self-pay

## 2022-03-19 DIAGNOSIS — E1169 Type 2 diabetes mellitus with other specified complication: Secondary | ICD-10-CM

## 2022-03-19 MED ORDER — DAPAGLIFLOZIN PROPANEDIOL 10 MG PO TABS: 10.0000 mg | ORAL_TABLET | Freq: Every day | ORAL | 1 refills | Status: DC

## 2022-03-20 ENCOUNTER — Ambulatory Visit: Payer: BC Managed Care – PPO | Admitting: Internal Medicine

## 2022-04-07 ENCOUNTER — Other Ambulatory Visit: Payer: Self-pay | Admitting: Internal Medicine

## 2022-05-03 ENCOUNTER — Encounter: Payer: Self-pay | Admitting: Internal Medicine

## 2022-05-03 ENCOUNTER — Ambulatory Visit: Payer: No Typology Code available for payment source | Admitting: Internal Medicine

## 2022-05-03 VITALS — BP 120/70 | HR 80 | Temp 98.1°F | Wt 208.1 lb

## 2022-05-03 DIAGNOSIS — F439 Reaction to severe stress, unspecified: Secondary | ICD-10-CM | POA: Diagnosis not present

## 2022-05-03 DIAGNOSIS — I10 Essential (primary) hypertension: Secondary | ICD-10-CM

## 2022-05-03 DIAGNOSIS — E039 Hypothyroidism, unspecified: Secondary | ICD-10-CM | POA: Diagnosis not present

## 2022-05-03 DIAGNOSIS — E785 Hyperlipidemia, unspecified: Secondary | ICD-10-CM | POA: Diagnosis not present

## 2022-05-03 DIAGNOSIS — E1169 Type 2 diabetes mellitus with other specified complication: Secondary | ICD-10-CM

## 2022-05-03 DIAGNOSIS — K7581 Nonalcoholic steatohepatitis (NASH): Secondary | ICD-10-CM

## 2022-05-03 NOTE — Progress Notes (Signed)
Established Patient Office Visit     CC/Reason for Visit: Stress at work  HPI: Krista Dawson is a 57 y.o. female who is coming in today for the above mentioned reasons. Past Medical History is significant for: Hypertension, hyperlipidemia, type 2 diabetes followed by endocrinology, hypothyroidism, NASH, obesity.  She took a new job last May.  Things have been stressful at work, lots of projects.  Sometimes she feels overwhelmed.  She "feels off", does not feel like herself.   Past Medical/Surgical History: Past Medical History:  Diagnosis Date   Allergy    Asthma    exercise induced - not used inhaler couple of yrs pt reported 10-03-2018   Blood transfusion without reported diagnosis    Colon polyp    Complication of anesthesia    went into Vtach after breast reduction surgery 1988/89   Depression    DM (diabetes mellitus)    GERD (gastroesophageal reflux disease)    HTN (hypertension)    Hyperlipidemia    Hypothyroidism    NASH (nonalcoholic steatohepatitis)    Obesity (BMI 30.0-34.9)    Post menopausal problems    Sleep apnea    no c-pap   Transaminitis    Vitamin D deficiency     Past Surgical History:  Procedure Laterality Date   ADRENALECTOMY Right    APPENDECTOMY     BIOPSY  12/12/2020   Procedure: BIOPSY;  Surgeon: Meridee Score Netty Starring., MD;  Location: [facility];  Service: Gastroenterology;;   BREAST REDUCTION SURGERY     CHOLECYSTECTOMY     ESOPHAGOGASTRODUODENOSCOPY N/A 12/12/2020   Procedure: ESOPHAGOGASTRODUODENOSCOPY (EGD);  Surgeon: Lemar Lofty., MD;  Location: [facility];  Service: Gastroenterology;  Laterality: N/A;   EUS N/A 12/12/2020   Procedure: UPPER ENDOSCOPIC ULTRASOUND (EUS) LINEAR;  Surgeon: Lemar Lofty., MD;  Location: [facility];  Service: Gastroenterology;  Laterality: N/A;   HYSTERECTOMY ABDOMINAL WITH SALPINGECTOMY     LIVER BIOPSY  2008, 2013   NASH   TONSILLECTOMY     UPPER GASTROINTESTINAL  ENDOSCOPY      Social History:  reports that she quit smoking about 15 years ago. Her smoking use included cigarettes. She has never used smokeless tobacco. She reports current alcohol use. She reports that she does not use drugs.  Allergies: Allergies  Allergen Reactions   Trulicity [Dulaglutide]     Pancreatitis    Spironolactone Hives and Swelling   Paxil [Paroxetine Hcl] Other (See Comments)    Other reaction(s): Other (comments) WORSENED DEPRESSION. WORSENED DEPRESSION.    Sulfa Antibiotics Hives and Itching   Codeine Itching and Rash    Family History:  Family History  Problem Relation Age of Onset   Breast cancer Mother    Colon polyps Mother    Stroke Mother 70   GER disease Father    Diabetes Father    Kidney disease Father    CAD Father    Other Father        cause of death listed as resp. failure   Colon polyps Sister    Stroke Sister    Diabetes Maternal Grandfather    Heart disease Maternal Grandfather    Diabetes Paternal Grandmother    CAD Paternal Grandfather    Pancreatic cancer Paternal Uncle    Pancreatic cancer Maternal Uncle    Colon cancer Neg Hx    Esophageal cancer Neg Hx    Stomach cancer Neg Hx    Rectal cancer Neg Hx  Current Outpatient Medications:    aspirin EC 81 MG tablet, Take 81 mg by mouth every evening., Disp: , Rfl:    Calcium-Magnesium (CAL-MAG PO), Take by mouth., Disp: , Rfl:    Cholecalciferol (VITAMIN D3) 50 MCG (2000 UT) TABS, Take 2,000 Units by mouth in the morning., Disp: , Rfl:    Continuous Blood Gluc Sensor (DEXCOM G7 SENSOR) MISC, 1 each by Does not apply route daily., Disp: 1 each, Rfl: 12   dapagliflozin propanediol (FARXIGA) 10 MG TABS tablet, Take 1 tablet (10 mg total) by mouth daily., Disp: 90 tablet, Rfl: 1   glucose blood (CONTOUR NEXT TEST) test strip, Check blood sugar 1 time daily, Disp: 100 each, Rfl: 12   hydrochlorothiazide (HYDRODIURIL) 25 MG tablet, TAKE 1 TABLET DAILY, Disp: 90 tablet, Rfl:  1   insulin glargine, 2 Unit Dial, (TOUJEO MAX SOLOSTAR) 300 UNIT/ML Solostar Pen, Inject 10 Units into the skin at bedtime. (Patient taking differently: Inject 12 Units into the skin at bedtime.), Disp: 15 mL, Rfl: 1   Insulin Pen Needle 32G X 4 MM MISC, 1 Device by Does not apply route daily in the afternoon., Disp: 100 each, Rfl: 4   lisinopril (ZESTRIL) 20 MG tablet, TAKE 1 TABLET DAILY, Disp: 90 tablet, Rfl: 1   magnesium chloride (SLOW-MAG) 64 MG TBEC SR tablet, Take 1 tablet by mouth daily., Disp: , Rfl:    Melatonin 10 MG CAPS, Take 10 mg by mouth at bedtime., Disp: , Rfl:    metFORMIN (GLUCOPHAGE-XR) 500 MG 24 hr tablet, Take 2 tablets (1,000 mg total) by mouth 2 (two) times daily with a meal., Disp: 360 tablet, Rfl: 3   Microlet Lancets MISC, by Does not apply route. Test 3-4 times daily (Micorlet Colored Lancets), Disp: , Rfl:    Omega-3 Fatty Acids (OMEGA 3 500 PO), Take 1,000 mg by mouth., Disp: , Rfl:    pantoprazole (PROTONIX) 40 MG tablet, TAKE 1 TABLET(40 MG) BY MOUTH DAILY 30 MINUTES BEFORE BREAKFAST, Disp: 30 tablet, Rfl: 11   rosuvastatin (CRESTOR) 10 MG tablet, TAKE 1 TABLET BY MOUTH     DAILY, Disp: 90 tablet, Rfl: 1   SYNTHROID 88 MCG tablet, TAKE 1 TABLET BY MOUTH     DAILY BEFORE BREAKFAST, Disp: 90 tablet, Rfl: 1   triamcinolone cream (KENALOG) 0.1 %, Apply 1 application. topically 2 (two) times daily. (Patient taking differently: Apply 1 application  topically 2 (two) times daily. PRN), Disp: 30 g, Rfl: 0   venlafaxine XR (EFFEXOR-XR) 37.5 MG 24 hr capsule, TAKE ONE CAPSULE BY MOUTH DAILY. PLEASE SCHEDULE A PHYSICAL FOR MORE REFILLS., Disp: 90 capsule, Rfl: 0  Review of Systems:  Negative unless indicated in HPI.   Physical Exam: Vitals:   05/03/22 0830  BP: 120/70  Pulse: 80  Temp: 98.1 F (36.7 C)  TempSrc: Oral  SpO2: 98%  Weight: 208 lb 1.6 oz (94.4 kg)    Body mass index is 34.1 kg/m.   Physical Exam Vitals reviewed.  Constitutional:       Appearance: Normal appearance.  HENT:     Head: Normocephalic and atraumatic.  Eyes:     Conjunctiva/sclera: Conjunctivae normal.     Pupils: Pupils are equal, round, and reactive to light.  Cardiovascular:     Rate and Rhythm: Regular rhythm.  Skin:    General: Skin is warm and dry.  Neurological:     General: No focal deficit present.     Mental Status: She is alert and oriented  to person, place, and time.  Psychiatric:        Mood and Affect: Mood normal.        Behavior: Behavior normal.        Thought Content: Thought content normal.        Judgment: Judgment normal.     Impression and Plan:  Stress  Primary hypertension  Hyperlipidemia, unspecified hyperlipidemia type  Hypothyroidism, unspecified type  NASH (nonalcoholic steatohepatitis)  Type 2 diabetes mellitus with other specified complication, without long-term current use of insulin  -Talked about coping mechanisms, importance of self-care and taking time off.  Have also given her resources to schedule CBT.  Time spent:33 minutes reviewing chart, interviewing and examining patient and formulating plan of care.     Chaya Jan, MD [facility]

## 2022-05-22 ENCOUNTER — Encounter: Payer: Self-pay | Admitting: Internal Medicine

## 2022-06-05 ENCOUNTER — Other Ambulatory Visit: Payer: Self-pay

## 2022-06-05 ENCOUNTER — Encounter: Payer: Self-pay | Admitting: Internal Medicine

## 2022-06-05 MED ORDER — DEXCOM G7 SENSOR MISC: 3 refills | Status: DC

## 2022-06-14 ENCOUNTER — Ambulatory Visit: Payer: No Typology Code available for payment source | Admitting: Dietician

## 2022-06-18 ENCOUNTER — Telehealth: Payer: Self-pay | Admitting: Dietician

## 2022-06-18 ENCOUNTER — Encounter: Payer: No Typology Code available for payment source | Attending: Internal Medicine | Admitting: Dietician

## 2022-06-18 ENCOUNTER — Encounter: Payer: Self-pay | Admitting: Dietician

## 2022-06-18 VITALS — Wt 210.0 lb

## 2022-06-18 DIAGNOSIS — E119 Type 2 diabetes mellitus without complications: Secondary | ICD-10-CM | POA: Diagnosis present

## 2022-06-18 NOTE — Patient Instructions (Addendum)
Aim to be active 5 days per week.  Little bits add up.  Continue to stay active - consider [employer], airport walking!  Mindfulness with food choices and portion sizes. HALTB  - Hungry, angry, lonely, tired, bored??  Continue to stay active - consider [employer], airport walking! Non-food reward - "time" Social connection - Tennis spring league, bible study, occasionally going into the office. Adequate sleep   Breakfast ideas: Consider resuming your smoothie (greek yogurt, hemp or flax seeds, spinach, berries) - consider meal prepping this (freeze everything but the greek yogurt)   Lunch ideas: Large salad for lunch with a lean chicken, tuna, or beans. Soup  (Homemade, [employer], or other quality soup) Open faced tuna sandwich on Ezekiel bread, raw vegetables, fresh fruit Cottage cheese and fruit Homemade "pizza" made on low carb tortilla or wrap, salad  Meal Delivery options:  (frozen, preprepared, or kits) [employer] a second language [employer].com  (advertises to see if you qualify for insurance coverage of the meals) [employer]com  [phone] (25% off with cod RD25 and pop up for 50% off) Whole [email or web address] (Whole foods plant based) Hungry Root Purple Carrot (vegan) Other companies can be found on line

## 2022-06-18 NOTE — Progress Notes (Unsigned)
Diabetes Self-Management Education  Visit Type: Follow-up  Appt. Start Time: 1545 Appt. End Time: 1615  06/21/2022  [name], identified by name and date of birth, is a 57 y.o. female with a diagnosis of Diabetes:  .   ASSESSMENT Patient is here today alone.  She was last seen by this RD on 03/09/2022.  She has been traveling for the past 4 weeks (2 weeks on vacation in [place] then 2 weeks of work traveling). [place] re energized her about food as she noted proper portion sizes there and the freshness of their food. Now when she eats out she asks for a to go box prior to eating. Did not pack part of her medications when she traveled to [place]. Walked 13,000-15,000 steps daily when on vacation.  - Re energized her about the need to increase her exercise. Working on Pharmacologist for stress. Better work balance. Stopped Factor Meals due to trans fat  Referral:  Type 2 Diabetes History includes:  Type 2 Diabetes (2005), pancreatitis, hypothyroidism, HTN, vitamin D deficiency, OSA (mild per patient and does not require c-pap), HLD, NASH Medications include:  Farxiga, Toujeo 12 units q HS, Metformin XR, melatonin, Omega 3, magnesium, vitamin D Labs include:  A1C 7.5% 03/09/2022  7.9% 09/19/2021, 7.7% 06/22/2021 CGM:  Dexcom G7 Sleep:  fair 6.5 hours per fit bit Exercise:  4,000 steps per day per fit bit.  Walks at times and rare tennis. Has a gym membership but doesn't go.   CGM Results from download:  12/11/2021 Josephine Igo) 03/09/2022 (Dexcom G7) 06/18/2022  % Time CGM active:   92 %   (Goal >70%) 100%   Average glucose:   160 mg/dL for 7 days 161 mg/dL for 14 days 096 X 14 days  Glucose management indicator:   7.1 % 7.3% 7.9  Time in range (70-180 mg/dL):   77 %   (Goal >04%) 70 49  Time High (181-250 mg/dL):   21 %   (Goal < 54%) 27 38  Time Very High (>250 mg/dL):    2 %   (Goal < 5%) 2 12  Time Low (54-69 mg/dL):   0 %   (Goal <0%) <1 0  Time Very Low (<54 mg/dL):   0 %   (Goal  <9%) 0 <1    65.5" 210 lbs 06/18/2022 205 lbs 03/08/2021 207 lbs 12/11/2021 Keto 2020 and lost 25 lbs  regained when she came off keto   Patient lives alone.  Doesn't cook much.  Not a lot of time. 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  Increased amounts of travel related to work since 05/2021. Moved here from [place] in 2020. Exercise 2-3 days per week - walking, tennis Father has a lot of vascular problems, renal issues and is an amputee.   Weight 210 lb (95.3 kg). Body mass index is 34.41 kg/m.   Diabetes Self-Management Education - 06/18/22 1700       Visit Information   Visit Type Follow-up      Psychosocial Assessment   Patient Belief/Attitude about Diabetes Motivated to manage diabetes    What is the hardest part about your diabetes right now, causing you the most concern, or is the most worrisome to you about your diabetes?   Making healty food and beverage choices    Self-care barriers None    Self-management support Doctor's office;CDE visits    Other persons present Patient    Patient Concerns  Nutrition/Meal planning;Healthy Lifestyle;Problem Solving    Special Needs None    Preferred Learning Style No preference indicated    Learning Readiness Change in progress      Pre-Education Assessment   Patient understands the diabetes disease and treatment process. Comprehends key points    Patient understands incorporating nutritional management into lifestyle. Needs Review    Patient undertands incorporating physical activity into lifestyle. Comprehends key points    Patient understands using medications safely. Comprehends key points    Patient understands monitoring blood glucose, interpreting and using results Comprehends key points    Patient understands prevention, detection, and treatment of acute complications. Comprehends key points    Patient understands prevention, detection, and treatment of chronic complications.  Compreheands key points    Patient understands how to develop strategies to address psychosocial issues. Comprehends key points    Patient understands how to develop strategies to promote health/change behavior. Needs Review      Complications   Last HgB A1C per patient/outside source 7.5 %   03/09/2022   How often do you check your blood sugar? > 4 times/day    Number of hypoglycemic episodes per month 1    Can you tell when your blood sugar is low? Yes      Dietary Intake   Breakfast protein shake (1/2)    Lunch protein shake (1/2)    Dinner 1/2 Cava (slaw, flafel, rice)    Snack (evening) melon and strawberries    Beverage(s) water, coffee with half and half, occasional coke zero      Activity / Exercise   Activity / Exercise Type Light (walking / raking leaves)    How many days per week do you exercise? 0    How many minutes per day do you exercise? 0    Total minutes per week of exercise 0      Patient Education   Previous Diabetes Education Yes (please comment)   [id number]   Healthy Eating Reviewed blood glucose goals for pre and post meals and how to evaluate the patients' food intake on their blood glucose level.;Meal options for control of blood glucose level and chronic complications.;Information on hints to eating out and maintain blood glucose control.    Being Active Role of exercise on diabetes management, blood pressure control and cardiac health.    Medications Reviewed patients medication for diabetes, action, purpose, timing of dose and side effects.    Diabetes Stress and Support Identified and addressed patients feelings and concerns about diabetes;Worked with patient to identify barriers to care and solutions;Role of stress on diabetes      Individualized Goals (developed by patient)   Nutrition General guidelines for healthy choices and portions discussed    Physical Activity Exercise 5-7 days per week;30 minutes per day    Medications take my medication as prescribed     Monitoring  Consistenly use CGM    Problem Solving Eating Pattern;Addressing barriers to behavior change    Reducing Risk examine blood glucose patterns;treat hypoglycemia with 15 grams of carbs if blood glucose less than 70mg /dL;do foot checks daily    Health Coping Ask for help with psychological, social, or emotional issues      Patient Self-Evaluation of Goals - Patient rates self as meeting previously set goals (% of time)   Nutrition 50 - 75 % (half of the time)    Physical Activity 50 - 75 % (half of the time)    Medications >75% (most of the time)  Monitoring >75% (most of the time)    Problem Solving and behavior change strategies  50 - 75 % (half of the time)    Reducing Risk (treating acute and chronic complications) 50 - 75 % (half of the time)    Health Coping >75% (most of the time)      Post-Education Assessment   Patient understands the diabetes disease and treatment process. Demonstrates understanding / competency    Patient understands incorporating nutritional management into lifestyle. Comprehends key points    Patient undertands incorporating physical activity into lifestyle. Demonstrates understanding / competency    Patient understands using medications safely. Comphrehends key points    Patient understands monitoring blood glucose, interpreting and using results Demonstrates understanding / competency    Patient understands prevention, detection, and treatment of acute complications. Demonstrates understanding / competency    Patient understands prevention, detection, and treatment of chronic complications. Demonstrates understanding / competency    Patient understands how to develop strategies to address psychosocial issues. Demonstrates understanding / competency    Patient understands how to develop strategies to promote health/change behavior. Comprehends key points      Outcomes   Expected Outcomes Demonstrated interest in learning. Expect positive  outcomes    Future DMSE 3-4 months    Program Status Not Completed      Subsequent Visit   Since your last visit have you experienced any weight changes? Gain    Weight Loss (lbs) 5             Individualized Plan for Diabetes Self-Management Training:   Learning Objective:  Patient will have a greater understanding of diabetes self-management. Patient education plan is to attend individual and/or group sessions per assessed needs and concerns.   Plan:   Patient Instructions  Aim to be active 5 days per week.  Little bits add up.  Continue to stay active - consider [employer], airport walking!  Mindfulness with food choices and portion sizes. HALTB  - Hungry, angry, lonely, tired, bored??  Continue to stay active - consider [employer], airport walking! Non-food reward - "time" Social connection - Tennis spring league, bible study, occasionally going into the office. Adequate sleep   Breakfast ideas: Consider resuming your smoothie (greek yogurt, hemp or flax seeds, spinach, berries) - consider meal prepping this (freeze everything but the greek yogurt)   Lunch ideas: Large salad for lunch with a lean chicken, tuna, or beans. Soup  (Homemade, [employer], or other quality soup) Open faced tuna sandwich on Ezekiel bread, raw vegetables, fresh fruit Cottage cheese and fruit Homemade "pizza" made on low carb tortilla or wrap, salad  Meal Delivery options:  (frozen, preprepared, or kits) English as a second language teacher.com  (advertises to see if you qualify for insurance coverage of the meals) [employer].com  [phone] (25% off with cod RD25 and pop up for 50% off) Whole [email or web address] (Whole foods plant based) Hungry Root Purple Carrot (vegan) Other companies can be found on line   Expected Outcomes:  Demonstrated interest in learning. Expect positive outcomes  Education material provided:   If problems or questions, patient to contact team via:  Phone  Future DSME  appointment: 3-4 months

## 2022-06-20 ENCOUNTER — Ambulatory Visit: Payer: BC Managed Care – PPO | Admitting: Internal Medicine

## 2022-06-21 ENCOUNTER — Ambulatory Visit: Payer: No Typology Code available for payment source | Admitting: Internal Medicine

## 2022-06-25 ENCOUNTER — Other Ambulatory Visit: Payer: Self-pay | Admitting: *Deleted

## 2022-06-25 ENCOUNTER — Encounter: Payer: Self-pay | Admitting: Internal Medicine

## 2022-06-25 ENCOUNTER — Telehealth: Payer: Self-pay | Admitting: Gastroenterology

## 2022-06-25 MED ORDER — PANTOPRAZOLE SODIUM 40 MG PO TBEC: 40.0000 mg | DELAYED_RELEASE_TABLET | Freq: Every day | ORAL | 0 refills | Status: DC

## 2022-06-25 NOTE — Telephone Encounter (Signed)
Inbound call from patient requesting refill for Protonix. Patient stated the refill needs to be sent to [facility] service. Patient is requesting a 90 days supply. Patient was scheduled for a follow up appointment on 8/26 at 3:00 with [facility]. Please advise.

## 2022-06-25 NOTE — Telephone Encounter (Signed)
Refill sent to pharmacy.   

## 2022-06-26 ENCOUNTER — Encounter: Payer: Self-pay | Admitting: Internal Medicine

## 2022-06-26 ENCOUNTER — Ambulatory Visit (INDEPENDENT_AMBULATORY_CARE_PROVIDER_SITE_OTHER): Payer: No Typology Code available for payment source | Admitting: Internal Medicine

## 2022-06-26 VITALS — BP 120/80 | HR 88 | Temp 98.5°F | Wt 209.1 lb

## 2022-06-26 DIAGNOSIS — E785 Hyperlipidemia, unspecified: Secondary | ICD-10-CM | POA: Diagnosis not present

## 2022-06-26 DIAGNOSIS — Z7984 Long term (current) use of oral hypoglycemic drugs: Secondary | ICD-10-CM

## 2022-06-26 DIAGNOSIS — E1169 Type 2 diabetes mellitus with other specified complication: Secondary | ICD-10-CM

## 2022-06-26 DIAGNOSIS — E669 Obesity, unspecified: Secondary | ICD-10-CM

## 2022-06-26 DIAGNOSIS — I10 Essential (primary) hypertension: Secondary | ICD-10-CM | POA: Diagnosis not present

## 2022-06-26 DIAGNOSIS — E039 Hypothyroidism, unspecified: Secondary | ICD-10-CM

## 2022-06-26 DIAGNOSIS — K7581 Nonalcoholic steatohepatitis (NASH): Secondary | ICD-10-CM

## 2022-06-26 DIAGNOSIS — E559 Vitamin D deficiency, unspecified: Secondary | ICD-10-CM

## 2022-06-26 LAB — POCT GLYCOSYLATED HEMOGLOBIN (HGB A1C): Hemoglobin A1C: 8.2 % — AB (ref 4.0–5.6)

## 2022-06-26 NOTE — Progress Notes (Signed)
Established Patient Office Visit     CC/Reason for Visit: Follow-up chronic medical conditions  HPI: Krista Dawson is a 57 y.o. female who is coming in today for the above mentioned reasons. Past Medical History is significant for: Hypertension, hyperlipidemia, type 2 diabetes, hypothyroidism, obesity, transaminitis secondary to NASH.  She just returned from a trip to [place].  Mood wise she is improved.  She unfortunately forgot her medication so was taking it every other day to stretch it out.  Her A1c has increased slightly to 8.2.  She is feeling well.   Past Medical/Surgical History: Past Medical History:  Diagnosis Date   Allergy    Asthma    exercise induced - not used inhaler couple of yrs pt reported 10-03-2018   Blood transfusion without reported diagnosis    Colon polyp    Complication of anesthesia    went into Vtach after breast reduction surgery 1988/89   Depression    DM (diabetes mellitus) (HCC)    GERD (gastroesophageal reflux disease)    HTN (hypertension)    Hyperlipidemia    Hypothyroidism    NASH (nonalcoholic steatohepatitis)    Obesity (BMI 30.0-34.9)    Post menopausal problems    Sleep apnea    no c-pap   Transaminitis    Vitamin D deficiency     Past Surgical History:  Procedure Laterality Date   ADRENALECTOMY Right    APPENDECTOMY     BIOPSY  12/12/2020   Procedure: BIOPSY;  Surgeon: Meridee Score Netty Starring., MD;  Location: [facility];  Service: Gastroenterology;;   BREAST REDUCTION SURGERY     CHOLECYSTECTOMY     ESOPHAGOGASTRODUODENOSCOPY N/A 12/12/2020   Procedure: ESOPHAGOGASTRODUODENOSCOPY (EGD);  Surgeon: Lemar Lofty., MD;  Location: [facility];  Service: Gastroenterology;  Laterality: N/A;   EUS N/A 12/12/2020   Procedure: UPPER ENDOSCOPIC ULTRASOUND (EUS) LINEAR;  Surgeon: Lemar Lofty., MD;  Location: [facility];  Service: Gastroenterology;  Laterality: N/A;   HYSTERECTOMY ABDOMINAL WITH SALPINGECTOMY      LIVER BIOPSY  2008, 2013   NASH   TONSILLECTOMY     UPPER GASTROINTESTINAL ENDOSCOPY      Social History:  reports that she quit smoking about 15 years ago. Her smoking use included cigarettes. She has never used smokeless tobacco. She reports current alcohol use. She reports that she does not use drugs.  Allergies: Allergies  Allergen Reactions   Trulicity [Dulaglutide]     Pancreatitis    Spironolactone Hives and Swelling   Paxil [Paroxetine Hcl] Other (See Comments)    Other reaction(s): Other (comments) WORSENED DEPRESSION. WORSENED DEPRESSION.    Sulfa Antibiotics Hives and Itching   Codeine Itching and Rash    Family History:  Family History  Problem Relation Age of Onset   Breast cancer Mother    Colon polyps Mother    Stroke Mother 72   GER disease Father    Diabetes Father    Kidney disease Father    CAD Father    Other Father        cause of death listed as resp. failure   Colon polyps Sister    Stroke Sister    Diabetes Maternal Grandfather    Heart disease Maternal Grandfather    Diabetes Paternal Grandmother    CAD Paternal Grandfather    Pancreatic cancer Paternal Uncle    Pancreatic cancer Maternal Uncle    Colon cancer Neg Hx    Esophageal cancer Neg Hx  Stomach cancer Neg Hx    Rectal cancer Neg Hx      Current Outpatient Medications:    aspirin EC 81 MG tablet, Take 81 mg by mouth every evening., Disp: , Rfl:    Calcium-Magnesium (CAL-MAG PO), Take by mouth., Disp: , Rfl:    Cholecalciferol (VITAMIN D3) 50 MCG (2000 UT) TABS, Take 2,000 Units by mouth in the morning., Disp: , Rfl:    Continuous Glucose Sensor (DEXCOM G7 SENSOR) MISC, Change sensors every 10 days DX E11.65, Disp: 9 each, Rfl: 3   dapagliflozin propanediol (FARXIGA) 10 MG TABS tablet, Take 1 tablet (10 mg total) by mouth daily., Disp: 90 tablet, Rfl: 1   glucose blood (CONTOUR NEXT TEST) test strip, Check blood sugar 1 time daily, Disp: 100 each, Rfl: 12    hydrochlorothiazide (HYDRODIURIL) 25 MG tablet, TAKE 1 TABLET DAILY, Disp: 90 tablet, Rfl: 1   insulin glargine, 2 Unit Dial, (TOUJEO MAX SOLOSTAR) 300 UNIT/ML Solostar Pen, Inject 10 Units into the skin at bedtime. (Patient taking differently: Inject 12 Units into the skin at bedtime.), Disp: 15 mL, Rfl: 1   Insulin Pen Needle 32G X 4 MM MISC, 1 Device by Does not apply route daily in the afternoon., Disp: 100 each, Rfl: 4   lisinopril (ZESTRIL) 20 MG tablet, TAKE 1 TABLET DAILY, Disp: 90 tablet, Rfl: 1   magnesium chloride (SLOW-MAG) 64 MG TBEC SR tablet, Take 1 tablet by mouth daily., Disp: , Rfl:    Melatonin 10 MG CAPS, Take 10 mg by mouth at bedtime., Disp: , Rfl:    metFORMIN (GLUCOPHAGE-XR) 500 MG 24 hr tablet, Take 2 tablets (1,000 mg total) by mouth 2 (two) times daily with a meal., Disp: 360 tablet, Rfl: 3   Microlet Lancets MISC, by Does not apply route. Test 3-4 times daily (Micorlet Colored Lancets), Disp: , Rfl:    Omega-3 Fatty Acids (OMEGA 3 500 PO), Take 1,000 mg by mouth., Disp: , Rfl:    pantoprazole (PROTONIX) 40 MG tablet, Take 1 tablet (40 mg total) by mouth daily., Disp: 90 tablet, Rfl: 0   rosuvastatin (CRESTOR) 10 MG tablet, TAKE 1 TABLET BY MOUTH     DAILY, Disp: 90 tablet, Rfl: 1   SYNTHROID 88 MCG tablet, TAKE 1 TABLET BY MOUTH     DAILY BEFORE BREAKFAST, Disp: 90 tablet, Rfl: 1   triamcinolone cream (KENALOG) 0.1 %, Apply 1 application. topically 2 (two) times daily. (Patient taking differently: Apply 1 application  topically 2 (two) times daily. PRN), Disp: 30 g, Rfl: 0   venlafaxine XR (EFFEXOR-XR) 37.5 MG 24 hr capsule, TAKE ONE CAPSULE BY MOUTH DAILY. PLEASE SCHEDULE A PHYSICAL FOR MORE REFILLS., Disp: 90 capsule, Rfl: 0  Review of Systems:  Negative unless indicated in HPI.   Physical Exam: Vitals:   06/26/22 0949  BP: 120/80  Pulse: 88  Temp: 98.5 F (36.9 C)  TempSrc: Oral  SpO2: 98%  Weight: 209 lb 1.6 oz (94.8 kg)    Body mass index is 34.27  kg/m.   Physical Exam Vitals reviewed.  Constitutional:      Appearance: Normal appearance.  HENT:     Head: Normocephalic and atraumatic.  Eyes:     Conjunctiva/sclera: Conjunctivae normal.     Pupils: Pupils are equal, round, and reactive to light.  Cardiovascular:     Rate and Rhythm: Normal rate and regular rhythm.  Pulmonary:     Effort: Pulmonary effort is normal.     Breath sounds: Normal  breath sounds.  Skin:    General: Skin is warm and dry.  Neurological:     General: No focal deficit present.     Mental Status: She is alert and oriented to person, place, and time.  Psychiatric:        Mood and Affect: Mood normal.        Behavior: Behavior normal.        Thought Content: Thought content normal.        Judgment: Judgment normal.      Impression and Plan:  Type 2 diabetes mellitus with other specified complication, without long-term current use of insulin (HCC) -     POCT glycosylated hemoglobin (Hb A1C) -     Microalbumin / creatinine urine ratio; Future  Primary hypertension  Hyperlipidemia, unspecified hyperlipidemia type  Hypothyroidism, unspecified type  NASH (nonalcoholic steatohepatitis)  Obesity (BMI 30.0-34.9)  Vitamin D deficiency  -A1c has increased slightly to 8.2, unsurprising given recent dietary indiscretions during her trip, also taking medications every other day.  She will work to improve this at next visit.  She has not followed by endocrinology. -Blood pressure is well-controlled on current medication. -She continues to take levothyroxine 88 mcg daily. -She is on rosuvastatin daily for hyperlipidemia.   Time spent:32 minutes reviewing chart, interviewing and examining patient and formulating plan of care.     Chaya Jan, MD [facility]

## 2022-09-02 ENCOUNTER — Encounter: Payer: Self-pay | Admitting: Internal Medicine

## 2022-09-02 ENCOUNTER — Other Ambulatory Visit: Payer: Self-pay | Admitting: Internal Medicine

## 2022-09-02 DIAGNOSIS — E039 Hypothyroidism, unspecified: Secondary | ICD-10-CM

## 2022-09-03 ENCOUNTER — Encounter: Payer: Self-pay | Admitting: Nurse Practitioner

## 2022-09-03 ENCOUNTER — Ambulatory Visit (INDEPENDENT_AMBULATORY_CARE_PROVIDER_SITE_OTHER): Payer: No Typology Code available for payment source | Admitting: Nurse Practitioner

## 2022-09-03 VITALS — BP 126/80 | HR 82 | Ht 65.5 in | Wt 212.0 lb

## 2022-09-03 DIAGNOSIS — Z86010 Personal history of colonic polyps: Secondary | ICD-10-CM | POA: Diagnosis not present

## 2022-09-03 DIAGNOSIS — K219 Gastro-esophageal reflux disease without esophagitis: Secondary | ICD-10-CM

## 2022-09-03 MED ORDER — VENLAFAXINE HCL ER 37.5 MG PO CP24: ORAL_CAPSULE | ORAL | 0 refills | Status: DC

## 2022-09-03 MED ORDER — PANTOPRAZOLE SODIUM 40 MG PO TBEC: 40.0000 mg | DELAYED_RELEASE_TABLET | Freq: Every day | ORAL | 3 refills | Status: DC

## 2022-09-03 NOTE — Patient Instructions (Signed)
You have been scheduled for a Barium Esophogram at [facility] (1st floor of the hospital) on 09/21/22 at 9:00 am. Please arrive 30 minutes prior to your appointment for registration. Make certain not to have anything to eat or drink 3 hours prior to your test. If you need to reschedule for any reason, please contact radiology at [phone] to do so. __________________________________________________________________________________  Colonoscopy due September 2025.  Due to recent changes in healthcare laws, you may see the results of your imaging and laboratory studies on MyChart before your provider has had a chance to review them.  We understand that in some cases there may be results that are confusing or concerning to you. Not all laboratory results come back in the same time frame and the provider may be waiting for multiple results in order to interpret others.  Please give [place] 48 hours in order for your provider to thoroughly review all the results before contacting the office for clarification of your results.   Thank you for trusting me with your gastrointestinal care!   Alcide Evener, CRNP

## 2022-09-03 NOTE — Progress Notes (Unsigned)
09/03/2022 Krista Dawson [id number] 27-Jan-1965   Chief Complaint:  History of Present Illness: Krista Dawson is a 57 year old female with a past medical history of metabolic syndrome X with obesity, diabetes mellitus type II, hypertension, dyslipidemia, MASLD and a hyperplastic colon polyp. Past Cholecystectomy, total laparoscopic hysterectomy bilateral salpingoopherectomy 11/2016 and appendectomy 2002     She presents today for her annual review and to refill Pantoprazole. She takes Pantoprazole 40mg  po every day.  No heartburn. Sometimes feels gagged in base of throat which can occur if she is drinking water or eating which occurs 4 times monthly. She takes deep breaths and the food goes down. Food such as a sub.   Stuck in throat   She takes aSA 60m daily      Latest Ref Rng & Units 12/23/2020    9:49 AM 07/19/2020   11:56 AM 11/27/2019    7:22 AM  CBC  WBC 4.0 - 10.5 K/uL 7.9  8.3  6.7   Hemoglobin 12.0 - 15.0 g/dL 95.2  84.1  32.4   Hematocrit 36.0 - 46.0 % 40.3  41.5  39.3   Platelets 150.0 - 400.0 K/uL 273.0  325.0  244.0         Latest Ref Rng & Units 08/25/2021   10:24 AM 06/22/2021    9:46 AM 12/23/2020    9:49 AM  CMP  Glucose 70 - 99 mg/dL  401  027   BUN 6 - 23 mg/dL  29  19   Creatinine 2.53 - 1.00 mg/dL 6.64  4.03  4.74   Sodium 135 - 145 mEq/L  134  140   Potassium 3.5 - 5.1 mEq/L  4.5  4.9   Chloride 96 - 112 mEq/L  102  107   CO2 19 - 32 mEq/L  21  26   Calcium 8.4 - 10.5 mg/dL  25.9  56.3   Total Protein 6.0 - 8.3 g/dL  7.2  6.8   Total Bilirubin 0.2 - 1.2 mg/dL  0.3  0.4   Alkaline Phos 39 - 117 U/L  77  67   AST 0 - 37 U/L  55  21   ALT 0 - 35 U/L  80  36      EGD/Endoscopic Ultrasound by Dr. Meridee Score 12/12/2020:  EGD Impression:  - No gross lesions in esophagus.Z-line irregular, 35 cm from the incisors.  - 2 cm hiatal hernia.  - Gastritis. Biopsied. -  No gross lesions in the duodenal bulb, in the first portion of the  duodenum and in the second portion of the duodenum.  - Normal major papilla.  EUS Impression:  - Pancreatic parenchymal abnormalities consisting of lobularity and hyperechoic strands were noted in the entire pancreas. There was no sign of significant pathology ducts of the pancreas. These findings do not constitute (2 Minor criteria) diagnosis of Chronic Pancreatitis.  - There was no sign of significant pathology in the common bile duct and in the common hepatic duct.  - There was no sign of significant pathology in the ampulla.  - One benign lymph node was visualized in the porta hepatis region. Tissue has not been obtained. However, the endosonographic appearance is consistent with benign inflammatory changes.  EGD 03/13/2019 by Dr. Orvan Falconer: - Mucosal nodule found in the esophagus. Biopsied.  - Normal esophagus. Biopsied.  - A medium amount of food (residue) in the stomach.  - Normal stomach. Biopsied.  - Retained food in the duodenum.  Colonoscopy 10/03/2018: - One 2 mm polyp in the ascending colon, removed with a cold snare. Resected and retrieved.  - The examination was otherwise normal on direct and retroflexion views. - 5 year recall colonoscopy due to family history sister and mom with polyps  Surgical [P], colon, ascending, polyp - HYPERPLASTIC POLYP (1 OF 1 FRAGMENTS) - NO HIGH GRADE DYSPLASIA OR MALIGNANCY IDENTIFIED  Liver biopsy in 2007 showed portal fibrosis Liver biopsy in 2011 showed resolution of ballooning and steatosis FibroScan 06/21/2016 showed a median E6.7, CAP 325, indicating minimal fibrosis and moderate steatosis. NASH fibrosure 02/18/2019: F0 fibrosis, S3 steatosis, N2 NASH   Past Medical History:  Diagnosis Date   Allergy    Asthma    exercise induced - not used inhaler couple of yrs pt reported 10-03-2018   Blood transfusion without reported diagnosis    Colon polyp    Complication of anesthesia    went into Vtach after breast reduction surgery 1988/89    Depression    DM (diabetes mellitus) (HCC)    GERD (gastroesophageal reflux disease)    HTN (hypertension)    Hyperlipidemia    Hypothyroidism    NASH (nonalcoholic steatohepatitis)    Obesity (BMI 30.0-34.9)    Post menopausal problems    Sleep apnea    no c-pap   Transaminitis    Vitamin D deficiency    Past Surgical History:  Procedure Laterality Date   ADRENALECTOMY Right    APPENDECTOMY     BIOPSY  12/12/2020   Procedure: BIOPSY;  Surgeon: Meridee Score Netty Starring., MD;  Location: [facility];  Service: Gastroenterology;;   BREAST REDUCTION SURGERY     CHOLECYSTECTOMY     ESOPHAGOGASTRODUODENOSCOPY N/A 12/12/2020   Procedure: ESOPHAGOGASTRODUODENOSCOPY (EGD);  Surgeon: Lemar Lofty., MD;  Location: [facility];  Service: Gastroenterology;  Laterality: N/A;   EUS N/A 12/12/2020   Procedure: UPPER ENDOSCOPIC ULTRASOUND (EUS) LINEAR;  Surgeon: Lemar Lofty., MD;  Location: [facility];  Service: Gastroenterology;  Laterality: N/A;   HYSTERECTOMY ABDOMINAL WITH SALPINGECTOMY     LIVER BIOPSY  2008, 2013   NASH   TONSILLECTOMY     UPPER GASTROINTESTINAL ENDOSCOPY     Current Medications, Allergies, Past Medical History, Past Surgical History, Family History and Social History were reviewed in [employer] record.  Review of Systems:   Constitutional: Negative for fever, sweats, chills or weight loss.  Respiratory: Negative for shortness of breath.   Cardiovascular: Negative for chest pain, palpitations and leg swelling.  Gastrointestinal: See HPI.  Musculoskeletal: Negative for back pain or muscle aches.  Neurological: Negative for dizziness, headaches or paresthesias.   Physical Exam: There were no vitals taken for this visit. General: in no acute distress. Head: Normocephalic and atraumatic. Eyes: No scleral icterus. Conjunctiva pink . Ears: Normal auditory acuity. Mouth: Dentition intact. No ulcers or lesions.  Lungs: Clear  throughout to auscultation. Heart: Regular rate and rhythm, no murmur. Abdomen: Soft, nontender and nondistended. No masses or hepatomegaly. Normal bowel sounds x 4 quadrants.  Rectal: Deferred.  Musculoskeletal: Symmetrical with no gross deformities. Extremities: No edema. Neurological: Alert oriented x 4. No focal deficits.  Psychological: Alert and cooperative. Normal mood and affect  Assessment and Recommendations: ***

## 2022-09-05 ENCOUNTER — Encounter: Payer: Self-pay | Admitting: Nurse Practitioner

## 2022-09-06 NOTE — Progress Notes (Signed)
Addendum: Reviewed and agree with assessment and management plan. Pyrtle, Jay M, MD  

## 2022-09-18 ENCOUNTER — Other Ambulatory Visit: Payer: Self-pay | Admitting: Internal Medicine

## 2022-09-18 ENCOUNTER — Encounter: Payer: Self-pay | Admitting: Internal Medicine

## 2022-09-18 ENCOUNTER — Ambulatory Visit (INDEPENDENT_AMBULATORY_CARE_PROVIDER_SITE_OTHER): Payer: No Typology Code available for payment source | Admitting: Internal Medicine

## 2022-09-18 VITALS — BP 126/80 | HR 80 | Ht 65.5 in | Wt 210.0 lb

## 2022-09-18 DIAGNOSIS — E1169 Type 2 diabetes mellitus with other specified complication: Secondary | ICD-10-CM

## 2022-09-18 DIAGNOSIS — E039 Hypothyroidism, unspecified: Secondary | ICD-10-CM

## 2022-09-18 DIAGNOSIS — Z7984 Long term (current) use of oral hypoglycemic drugs: Secondary | ICD-10-CM

## 2022-09-18 DIAGNOSIS — E1165 Type 2 diabetes mellitus with hyperglycemia: Secondary | ICD-10-CM | POA: Diagnosis not present

## 2022-09-18 DIAGNOSIS — F430 Acute stress reaction: Secondary | ICD-10-CM

## 2022-09-18 DIAGNOSIS — Z794 Long term (current) use of insulin: Secondary | ICD-10-CM

## 2022-09-18 DIAGNOSIS — E785 Hyperlipidemia, unspecified: Secondary | ICD-10-CM | POA: Insufficient documentation

## 2022-09-18 LAB — POCT GLYCOSYLATED HEMOGLOBIN (HGB A1C): Hemoglobin A1C: 8.4 % — AB (ref 4.0–5.6)

## 2022-09-18 LAB — MICROALBUMIN / CREATININE URINE RATIO
Creatinine,U: 41 mg/dL
Microalb Creat Ratio: 2.2 mg/g (ref 0.0–30.0)
Microalb, Ur: 0.9 mg/dL (ref 0.0–1.9)

## 2022-09-18 LAB — LIPID PANEL
Cholesterol: 124 mg/dL (ref 0–200)
HDL: 38.7 mg/dL — ABNORMAL LOW (ref >39.00)
LDL Cholesterol: 45 mg/dL (ref 0–99)
NonHDL: 84.86
Total CHOL/HDL Ratio: 3
Triglycerides: 200 mg/dL — ABNORMAL HIGH (ref 0.0–149.0)
VLDL: 40 mg/dL (ref 0.0–40.0)

## 2022-09-18 LAB — BASIC METABOLIC PANEL
BUN: 25 mg/dL — ABNORMAL HIGH (ref 6–23)
CO2: 22 meq/L (ref 19–32)
Calcium: 10.2 mg/dL (ref 8.4–10.5)
Chloride: 107 meq/L (ref 96–112)
Creatinine, Ser: 0.9 mg/dL (ref 0.40–1.20)
GFR: 72.08 mL/min (ref >60.00)
Glucose, Bld: 158 mg/dL — ABNORMAL HIGH (ref 70–99)
Potassium: 4.3 meq/L (ref 3.5–5.1)
Sodium: 139 meq/L (ref 135–145)

## 2022-09-18 LAB — CBC
HCT: 41.6 % (ref 36.0–46.0)
Hemoglobin: 13 g/dL (ref 12.0–15.0)
MCHC: 31.3 g/dL (ref 30.0–36.0)
MCV: 79.9 fl (ref 78.0–100.0)
Platelets: 289 10*3/uL (ref 150.0–400.0)
RBC: 5.21 Mil/uL — ABNORMAL HIGH (ref 3.87–5.11)
RDW: 15.5 % (ref 11.5–15.5)
WBC: 7.9 10*3/uL (ref 4.0–10.5)

## 2022-09-18 LAB — VITAMIN D 25 HYDROXY (VIT D DEFICIENCY, FRACTURES): VITD: 41.9 ng/mL (ref 30.00–100.00)

## 2022-09-18 LAB — TSH: TSH: 2.45 u[IU]/mL (ref 0.35–5.50)

## 2022-09-18 MED ORDER — METFORMIN HCL ER 500 MG PO TB24: 1000.0000 mg | ORAL_TABLET | Freq: Two times a day (BID) | ORAL | 3 refills | Status: DC

## 2022-09-18 MED ORDER — GLIPIZIDE 5 MG PO TABS: 5.0000 mg | ORAL_TABLET | Freq: Two times a day (BID) | ORAL | 2 refills | Status: DC

## 2022-09-18 MED ORDER — TOPIRAMATE 50 MG PO TABS: 50.0000 mg | ORAL_TABLET | Freq: Every day | ORAL | 2 refills | Status: DC

## 2022-09-18 MED ORDER — INSULIN PEN NEEDLE 32G X 4 MM MISC: 1.0000 | Freq: Every day | 4 refills | Status: DC

## 2022-09-18 MED ORDER — DAPAGLIFLOZIN PROPANEDIOL 10 MG PO TABS
10.0000 mg | ORAL_TABLET | Freq: Every day | ORAL | 1 refills | Status: DC
Start: 2022-09-18 — End: 2023-01-29

## 2022-09-18 MED ORDER — TOUJEO MAX SOLOSTAR 300 UNIT/ML ~~LOC~~ SOPN
14.0000 [IU] | PEN_INJECTOR | Freq: Every day | SUBCUTANEOUS | 2 refills | Status: DC
Start: 2022-09-18 — End: 2023-01-29

## 2022-09-18 NOTE — Progress Notes (Unsigned)
Name: Krista Dawson  MRN/ DOB: [id number], [birth year]   Age/ Sex: 57 y.o., female    PCP: Philip Aspen, Limmie Patricia, MD   Reason for Endocrinology Evaluation: Type 2 Diabetes Mellitus     Date of Initial Endocrinology Visit: 11/03/2021    PATIENT IDENTIFIER: Krista Dawson is a 57 y.o. female with a past medical history of HTN, NASH, Hx pancreatitis, and hypothyroidism. The patient presented for initial endocrinology clinic visit on 11/03/2021  for consultative assistance with her diabetes management.    HPI: Krista Dawson was    Diagnosed with DM 2005 Prior Medications tried/Intolerance: trulicity 1.5 mg  - abdominal pain with elevated lipase but no pancreatitis diagnosis . Insulin started 08/2021          Hemoglobin A1c has ranged from 6.0% in 2022, peaking at 8.6% in 2020.  The patient was on Trulicity until June 2022 when she developed severe abdominal pain, her lipase has been consistently elevated but there was no evidence of pancreatitis Due to persistent elevation in lipase she was seen by [facility]    Has family history of pancreatic cancer  Both parents with thyroid  disease    On her initial visit to our clinic she had an A1c of 7.9% she was on basal insulin, metformin, and Farxiga which we adjusted  GAD-65 and islet cell antibody were undetectable  SUBJECTIVE:   During the last visit (03/09/2022): A1c 7.5%  Today (09/18/22): Krista Dawson is here for follow-up on diabetes management.  She checks her blood sugars multiple times daily. The patient has not had hypoglycemic episodes since the last clinic visit.   She met with our CDE 03/09/2022 She continues to follow with GI for NASH   She took a trip to [place] in early summer and forgot her medications and was taking it every other day which could explain the increase in A1c to 8.2% She has been stressed and not making proper choices She is on Effexor  She is extremely tired for  weeks Denies constipation or diarrhea  No changes in bowel movements   HOME DIABETES REGIMEN: Metformin 500 mg XR  2 tabs BID  Farxiga 10 mg daily  Toujeo 12 units daily daily    Statin: yes ACE-I/ARB: yes    CONTINUOUS GLUCOSE MONITORING RECORD INTERPRETATION    Dates of Recording: 8/28-9/10/2022  Sensor description:dexcom  Results statistics:   CGM use % of time 93  Average and SD 221/49  Time in range  20 %  % Time Above 180 55  % Time above 250 25  % Time Below target 0   Glycemic patterns summary: Weight BG's are high during the day and night  Hyperglycemic episodes postprandial  Hypoglycemic episodes occurred N/A  Overnight periods: High    DIABETIC COMPLICATIONS: Microvascular complications:  cataract Denies: CKD , retinopathy, neuropathy  Last eye exam: Completed 05/2021  Macrovascular complications:   Denies: CAD, PVD, CVA   PAST HISTORY: Past Medical History:  Past Medical History:  Diagnosis Date   Allergy    Asthma    exercise induced - not used inhaler couple of yrs pt reported 10-03-2018   Blood transfusion without reported diagnosis    Colon polyp    Complication of anesthesia    went into Vtach after breast reduction surgery 1988/89   Depression    DM (diabetes mellitus) (HCC)    GERD (gastroesophageal reflux disease)    HTN (hypertension)    Hyperlipidemia    Hypothyroidism  NASH (nonalcoholic steatohepatitis)    Obesity (BMI 30.0-34.9)    Post menopausal problems    Sleep apnea    no c-pap   Transaminitis    Vitamin D deficiency    Past Surgical History:  Past Surgical History:  Procedure Laterality Date   ADRENALECTOMY Right    APPENDECTOMY     BIOPSY  12/12/2020   Procedure: BIOPSY;  Surgeon: Meridee Score Netty Starring., MD;  Location: [facility];  Service: Gastroenterology;;   BREAST REDUCTION SURGERY     CHOLECYSTECTOMY     ESOPHAGOGASTRODUODENOSCOPY N/A 12/12/2020   Procedure: ESOPHAGOGASTRODUODENOSCOPY (EGD);   Surgeon: Lemar Lofty., MD;  Location: [facility];  Service: Gastroenterology;  Laterality: N/A;   EUS N/A 12/12/2020   Procedure: UPPER ENDOSCOPIC ULTRASOUND (EUS) LINEAR;  Surgeon: Lemar Lofty., MD;  Location: [facility];  Service: Gastroenterology;  Laterality: N/A;   HYSTERECTOMY ABDOMINAL WITH SALPINGECTOMY     LIVER BIOPSY  2008, 2013   NASH   TONSILLECTOMY     UPPER GASTROINTESTINAL ENDOSCOPY      Social History:  reports that she quit smoking about 15 years ago. Her smoking use included cigarettes. She has never used smokeless tobacco. She reports current alcohol use. She reports that she does not use drugs. Family History:  Family History  Problem Relation Age of Onset   Breast cancer Mother    Colon polyps Mother    Stroke Mother 60   GER disease Father    Diabetes Father    Kidney disease Father    CAD Father    Other Father        cause of death listed as resp. failure   Colon polyps Sister    Stroke Sister    Diabetes Maternal Grandfather    Heart disease Maternal Grandfather    Diabetes Paternal Grandmother    CAD Paternal Grandfather    Pancreatic cancer Paternal Uncle    Pancreatic cancer Maternal Uncle    Colon cancer Neg Hx    Esophageal cancer Neg Hx    Stomach cancer Neg Hx    Rectal cancer Neg Hx      HOME MEDICATIONS: Allergies as of 09/18/2022       Reactions   Trulicity [dulaglutide]    Pancreatitis   Spironolactone Hives, Swelling   Paxil [paroxetine Hcl] Other (See Comments)   Other reaction(s): Other (comments) WORSENED DEPRESSION. WORSENED DEPRESSION.   Sulfa Antibiotics Hives, Itching   Codeine Itching, Rash        Medication List        Accurate as of September 18, 2022  9:15 AM. If you have any questions, ask your nurse or doctor.          aspirin EC 81 MG tablet Take 81 mg by mouth every evening.   CAL-MAG PO Take by mouth.   Contour Next Test test strip Generic drug: glucose blood Check  blood sugar 1 time daily   dapagliflozin propanediol 10 MG Tabs tablet Commonly known as: Farxiga Take 1 tablet (10 mg total) by mouth daily.   Dexcom G7 Sensor Misc Change sensors every 10 days DX E11.65   hydrochlorothiazide 25 MG tablet Commonly known as: HYDRODIURIL TAKE 1 TABLET DAILY   Insulin Pen Needle 32G X 4 MM Misc 1 Device by Does not apply route daily in the afternoon.   lisinopril 20 MG tablet Commonly known as: ZESTRIL TAKE 1 TABLET DAILY   magnesium chloride 64 MG Tbec SR tablet Commonly known as: SLOW-MAG Take  1 tablet by mouth daily.   Melatonin 10 MG Caps Take 10 mg by mouth at bedtime.   metFORMIN 500 MG 24 hr tablet Commonly known as: GLUCOPHAGE-XR Take 2 tablets (1,000 mg total) by mouth 2 (two) times daily with a meal.   Microlet Lancets Misc by Does not apply route. Test 3-4 times daily (Micorlet Colored Lancets)   OMEGA 3 500 PO Take 1,000 mg by mouth.   pantoprazole 40 MG tablet Commonly known as: PROTONIX Take 1 tablet (40 mg total) by mouth daily. Take 30 minutes before breakfast.   rosuvastatin 10 MG tablet Commonly known as: CRESTOR TAKE 1 TABLET DAILY   Synthroid 88 MCG tablet Generic drug: levothyroxine TAKE 1 TABLET DAILY BEFORE BREAKFAST   Toujeo Max SoloStar 300 UNIT/ML Solostar Pen Generic drug: insulin glargine (2 Unit Dial) Inject 10 Units into the skin at bedtime. What changed: how much to take   triamcinolone cream 0.1 % Commonly known as: KENALOG Apply 1 application. topically 2 (two) times daily. What changed: additional instructions   venlafaxine XR 37.5 MG 24 hr capsule Commonly known as: EFFEXOR-XR TAKE ONE CAPSULE BY MOUTH DAILY. PLEASE SCHEDULE A PHYSICAL FOR MORE REFILLS.   Vitamin D3 50 MCG (2000 UT) Tabs Take 2,000 Units by mouth in the morning.         ALLERGIES: Allergies  Allergen Reactions   Trulicity [Dulaglutide]     Pancreatitis    Spironolactone Hives and Swelling   Paxil  [Paroxetine Hcl] Other (See Comments)    Other reaction(s): Other (comments) WORSENED DEPRESSION. WORSENED DEPRESSION.    Sulfa Antibiotics Hives and Itching   Codeine Itching and Rash     REVIEW OF SYSTEMS: A comprehensive ROS was conducted with the patient and is negative except as per HPI    OBJECTIVE:   VITAL SIGNS: BP 126/80 (BP Location: Left Arm, Patient Position: Sitting, Cuff Size: Large)   Pulse 80   Ht 5' 5.5" (1.664 m)   Wt 210 lb (95.3 kg)   SpO2 97%   BMI 34.41 kg/m    PHYSICAL EXAM:  General: Pt appears well and is in NAD  Neck: General: Supple without adenopathy or carotid bruits. Thyroid: Thyroid size normal.  No goiter or nodules appreciated.   Lungs: Clear with good BS bilat with no rales, rhonchi, or wheezes  Heart: RRR   Abdomen:  soft, nontender  Extremities:  Lower extremities - No pretibial edema.   Neuro: MS is good with appropriate affect, pt is alert and Ox3    DM foot exam: 09/18/2022  The skin of the feet is intact without sores or ulcerations. The pedal pulses are 2+ on right and 2+ on left. The sensation is intact to a screening 5.07, 10 gram monofilament bilaterally     DATA REVIEWED:  Lab Results  Component Value Date   HGBA1C 8.2 (A) 06/26/2022   HGBA1C 7.5 (A) 03/09/2022   HGBA1C 7.9 (A) 12/21/2021   Lab Results  Component Value Date   MICROALBUR 1.2 06/22/2021   LDLCALC 60 12/23/2020   CREATININE 1.00 08/25/2021   Lab Results  Component Value Date   MICRALBCREAT 3.2 06/22/2021    Lab Results  Component Value Date   CHOL 157 08/16/2021   HDL 37.60 (L) 06/22/2021   LDLCALC 60 12/23/2020   LDLDIRECT 62.0 06/22/2021   TRIG 321 (H) 08/16/2021   CHOLHDL 3 06/22/2021        ASSESSMENT / PLAN / RECOMMENDATIONS:   1) Type 2 Diabetes  Mellitus, poorly controlled, Without  complications - Most recent A1c of 8.4 %. Goal A1c < 7.0 %.    -Patient continues with worsening glycemic control -Main barriers to diabetes  self-care is stress -Patient not a candidate for GLP-1 agonist nor DPP 4 inhibitors due to history of pancreatitis while on Trulicity -GAD-65 and islet cell antibody negative  -I have recommended glipizide to be taken before breakfast and supper -Will increase insulin as below   MEDICATIONS: Increase Toujeo 14 units daily Start glipizide 5 mg twice daily Continue metformin 500 mg XR 2 tabs BID Continue Farxiga 10 mg daily  EDUCATION / INSTRUCTIONS: BG monitoring instructions: Patient is instructed to check her blood sugars  3 times a day, before meals . Call [facility] if: BG persistently < 70  I reviewed the Rule of 15 for the treatment of hypoglycemia in detail with the patient. Literature supplied.   2) Diabetic complications:  Eye: Does not have known diabetic retinopathy.  Neuro/ Feet: Does not have known diabetic peripheral neuropathy. Renal: Patient does not have known baseline CKD. She is  on an ACEI/ARB at present.  3) Stress :  -Patient attributes dietary indiscretions due to stress -She is on Effexor -A referral has been placed to behavioral health   5)Hypothyroidism:     6) Dyslipidemia:   Medication  Rosuvastatin 50 mg daily   7) Weight Gain:  -I have recommended topiramate as below, caution against tingling and numbness   Medication Start topiramate 50 mg, half a tablet at bedtime for 2 weeks then increase to 1 full tablet at bedtime daily   Follow-up in 4 months   Signed electronically by: Lyndle Herrlich, MD  [facility]  [facility] [address] [place], [place] [address] Phone: [phone] FAX: [phone]   CC: Philip Aspen, Limmie Patricia, MD [address] [place] [address] Phone: [phone]  Fax: [phone]    Return to Endocrinology clinic as below: Future Appointments  Date Time Provider Department Center  09/25/2022 10:00 AM [facility] R[facility]F 1 [facility]  10/01/2022  9:00 AM Bonnita Levan, RD [facility]  12/26/2022  9:00 AM Philip Aspen, Limmie Patricia, MD [id number]

## 2022-09-18 NOTE — Patient Instructions (Signed)
Increase  Toujeo 14 units daily  Continue Metformin 500 mg XR 2 tablets twice daily  Continue Farxiga 10 mg daily  Start Glipizide 5mg , 1 tablet before Breakfast and 1 tablet before Supper   Start Topamax 50mg , half a tablet at night for 2 weeks, than increase to 1 full tablet daily    HOW TO TREAT LOW BLOOD SUGARS (Blood sugar LESS THAN 70 MG/DL) Please follow the RULE OF 15 for the treatment of hypoglycemia treatment (when your (blood sugars are less than 70 mg/dL)   STEP 1: Take 15 grams of carbohydrates when your blood sugar is low, which includes:  3-4 GLUCOSE TABS  OR 3-4 OZ OF JUICE OR REGULAR SODA OR ONE TUBE OF GLUCOSE GEL    STEP 2: RECHECK blood sugar in 15 MINUTES STEP 3: If your blood sugar is still low at the 15 minute recheck --> then, go back to STEP 1 and treat AGAIN with another 15 grams of carbohydrates.    [employer]  Nutritionist in [place], [place] Address: [address] [place], [place] [address]  Phone: [phone]

## 2022-09-19 ENCOUNTER — Encounter: Payer: Self-pay | Admitting: Internal Medicine

## 2022-09-19 MED ORDER — LEVOTHYROXINE SODIUM 88 MCG PO TABS
88.0000 ug | ORAL_TABLET | Freq: Every day | ORAL | 3 refills | Status: DC
Start: 2022-09-19 — End: 2022-11-08

## 2022-09-21 ENCOUNTER — Ambulatory Visit (HOSPITAL_COMMUNITY): Payer: No Typology Code available for payment source

## 2022-09-25 ENCOUNTER — Other Ambulatory Visit: Payer: Self-pay | Admitting: Nurse Practitioner

## 2022-09-25 ENCOUNTER — Ambulatory Visit (HOSPITAL_COMMUNITY)
Admission: RE | Admit: 2022-09-25 | Discharge: 2022-09-25 | Disposition: A | Discharge status: Home / Self Care | Payer: No Typology Code available for payment source | Source: Ambulatory Visit | Attending: Nurse Practitioner | Admitting: Nurse Practitioner

## 2022-09-25 DIAGNOSIS — K219 Gastro-esophageal reflux disease without esophagitis: Secondary | ICD-10-CM

## 2022-09-25 DIAGNOSIS — Z86010 Personal history of colonic polyps: Secondary | ICD-10-CM

## 2022-10-01 ENCOUNTER — Ambulatory Visit: Payer: No Typology Code available for payment source | Admitting: Dietician

## 2022-10-17 NOTE — Telephone Encounter (Signed)
See barium swallow study notes, I called patient today to discuss scheduling a high resolution esophageal manometry as recommended by Dr. Rhea Belton.

## 2022-10-25 ENCOUNTER — Other Ambulatory Visit: Payer: Self-pay

## 2022-10-25 ENCOUNTER — Encounter: Payer: Self-pay | Admitting: Internal Medicine

## 2022-10-25 ENCOUNTER — Encounter: Payer: Self-pay | Admitting: *Deleted

## 2022-10-25 DIAGNOSIS — R040 Epistaxis: Secondary | ICD-10-CM

## 2022-10-25 DIAGNOSIS — R1310 Dysphagia, unspecified: Secondary | ICD-10-CM

## 2022-10-31 ENCOUNTER — Encounter (INDEPENDENT_AMBULATORY_CARE_PROVIDER_SITE_OTHER): Payer: Self-pay | Admitting: Otolaryngology

## 2022-11-08 ENCOUNTER — Other Ambulatory Visit: Payer: Self-pay | Admitting: Internal Medicine

## 2022-11-08 DIAGNOSIS — E039 Hypothyroidism, unspecified: Secondary | ICD-10-CM

## 2022-11-23 ENCOUNTER — Telehealth (INDEPENDENT_AMBULATORY_CARE_PROVIDER_SITE_OTHER): Payer: Self-pay | Admitting: Otolaryngology

## 2022-11-23 NOTE — Telephone Encounter (Signed)
Patient confirmed appt and location

## 2022-11-26 ENCOUNTER — Encounter (INDEPENDENT_AMBULATORY_CARE_PROVIDER_SITE_OTHER): Payer: Self-pay

## 2022-11-26 ENCOUNTER — Ambulatory Visit (INDEPENDENT_AMBULATORY_CARE_PROVIDER_SITE_OTHER): Payer: No Typology Code available for payment source | Admitting: Otolaryngology

## 2022-11-26 VITALS — Ht 65.0 in | Wt 210.0 lb

## 2022-11-26 DIAGNOSIS — R040 Epistaxis: Secondary | ICD-10-CM

## 2022-11-26 MED ORDER — MUPIROCIN 2 % EX OINT: 1.0000 | TOPICAL_OINTMENT | Freq: Two times a day (BID) | CUTANEOUS | 0 refills | Status: AC

## 2022-11-26 MED ORDER — AYR SALINE NASAL NA GEL: 1.0000 | Freq: Two times a day (BID) | NASAL | 1 refills | Status: DC

## 2022-11-26 NOTE — Progress Notes (Signed)
Dear Dr. Philip Aspen, Here is my assessment for our mutual patient, Krista Dawson. Thank you for allowing me the opportunity to care for your patient. Please do not hesitate to contact me should you have any other questions. Sincerely, Dr. Jovita Kussmaul  Otolaryngology Clinic Note Referring provider: Dr. Philip Aspen HPI:  Krista Dawson is a 57 y.o. female kindly referred by Dr. Philip Aspen for evaluation of right epistaxis  Epistaxis history: nose bleeds on and off for years. More frequent recently. Has been months with increased frequency. GI asked her to look at it given upcoming esophageal manometry. It is Bothersome for her, and now occurring once a week. Always from right HTN: yes Frequency: once per week; few tablespoons Side: right Intervention: 10 mins, pressure stops it.  CKD/Liver dysfunction: no (NASH) Anticoagulation/AP: ASA 81 Trauma: no History of Sinusitis: no Nasal obstruction: no Nasal procedures: no Current nasal medication use: no  H&N Surgery: Tonsillectomy Personal or FHx of bleeding dz or anesthesia difficulty: no  GLP-1: denies AP/AC: ASA 81  Tobacco: former smoker - quit 10 ys ago  PMHx: HLD, HTN, hypothyroidism, OSA, Obesity, T2DM, NASH, Dysphagia  Independent Review of Additional Tests or Records:  Esophagram 09/25/2022: no significant stricture, some dysmotility GI notes reviewed, referral notes reviewed CBC 09/2022 :Hgb 13 PT/INR: 10/0.8 10/2022  PMH/Meds/All/SocHx/FamHx/ROS:   Past Medical History:  Diagnosis Date   Allergy    Asthma    exercise induced - not used inhaler couple of yrs pt reported 10-03-2018   Blood transfusion without reported diagnosis    Colon polyp    Complication of anesthesia    went into Vtach after breast reduction surgery 1988/89   Depression    DM (diabetes mellitus) (HCC)    GERD (gastroesophageal reflux disease)    HTN (hypertension)    Hyperlipidemia    Hypothyroidism    NASH  (nonalcoholic steatohepatitis)    Obesity (BMI 30.0-34.9)    Post menopausal problems    Sleep apnea    no c-pap   Transaminitis    Vitamin D deficiency      Past Surgical History:  Procedure Laterality Date   ADRENALECTOMY Right    APPENDECTOMY     BIOPSY  12/12/2020   Procedure: BIOPSY;  Surgeon: Meridee Score Netty Starring., MD;  Location: [facility];  Service: Gastroenterology;;   BREAST REDUCTION SURGERY     CHOLECYSTECTOMY     ESOPHAGOGASTRODUODENOSCOPY N/A 12/12/2020   Procedure: ESOPHAGOGASTRODUODENOSCOPY (EGD);  Surgeon: Lemar Lofty., MD;  Location: [facility];  Service: Gastroenterology;  Laterality: N/A;   EUS N/A 12/12/2020   Procedure: UPPER ENDOSCOPIC ULTRASOUND (EUS) LINEAR;  Surgeon: Lemar Lofty., MD;  Location: [facility];  Service: Gastroenterology;  Laterality: N/A;   HYSTERECTOMY ABDOMINAL WITH SALPINGECTOMY     LIVER BIOPSY  2008, 2013   NASH   TONSILLECTOMY     UPPER GASTROINTESTINAL ENDOSCOPY      Family History  Problem Relation Age of Onset   Breast cancer Mother    Colon polyps Mother    Stroke Mother 55   GER disease Father    Diabetes Father    Kidney disease Father    CAD Father    Other Father        cause of death listed as resp. failure   Colon polyps Sister    Stroke Sister    Diabetes Maternal Grandfather    Heart disease Maternal Grandfather    Diabetes Paternal Grandmother    CAD Paternal Grandfather  Pancreatic cancer Paternal Uncle    Pancreatic cancer Maternal Uncle    Colon cancer Neg Hx    Esophageal cancer Neg Hx    Stomach cancer Neg Hx    Rectal cancer Neg Hx      Social Connections: Moderately Integrated (04/30/2022)   Social Connection and Isolation Panel [NHANES]    Frequency of Communication with Friends and Family: Three times a week    Frequency of Social Gatherings with Friends and Family: Once a week    Attends Religious Services: 1 to 4 times per year    Active Member of [employer] or  Organizations: Yes    Attends Engineer, structural: More than 4 times per year    Marital Status: Never married      Current Outpatient Medications:    aspirin EC 81 MG tablet, Take 81 mg by mouth every evening., Disp: , Rfl:    Calcium-Magnesium (CAL-MAG PO), Take by mouth., Disp: , Rfl:    Cholecalciferol (VITAMIN D3) 50 MCG (2000 UT) TABS, Take 2,000 Units by mouth in the morning., Disp: , Rfl:    Continuous Glucose Sensor (DEXCOM G7 SENSOR) MISC, Change sensors every 10 days DX E11.65, Disp: 9 each, Rfl: 3   dapagliflozin propanediol (FARXIGA) 10 MG TABS tablet, Take 1 tablet (10 mg total) by mouth daily., Disp: 90 tablet, Rfl: 1   glipiZIDE (GLUCOTROL) 5 MG tablet, TAKE 1 TABLET TWICE A DAY  BEFORE MEALS., Disp: 180 tablet, Rfl: 2   glucose blood (CONTOUR NEXT TEST) test strip, Check blood sugar 1 time daily, Disp: 100 each, Rfl: 12   hydrochlorothiazide (HYDRODIURIL) 25 MG tablet, TAKE 1 TABLET DAILY, Disp: 90 tablet, Rfl: 1   insulin glargine, 2 Unit Dial, (TOUJEO MAX SOLOSTAR) 300 UNIT/ML Solostar Pen, Inject 14 Units into the skin at bedtime., Disp: 15 mL, Rfl: 2   Insulin Pen Needle 32G X 4 MM MISC, 1 Device by Does not apply route daily in the afternoon., Disp: 100 each, Rfl: 4   lisinopril (ZESTRIL) 20 MG tablet, TAKE 1 TABLET DAILY, Disp: 90 tablet, Rfl: 1   magnesium chloride (SLOW-MAG) 64 MG TBEC SR tablet, Take 1 tablet by mouth daily., Disp: , Rfl:    Melatonin 10 MG CAPS, Take 10 mg by mouth at bedtime., Disp: , Rfl:    metFORMIN (GLUCOPHAGE-XR) 500 MG 24 hr tablet, Take 2 tablets (1,000 mg total) by mouth 2 (two) times daily with a meal., Disp: 360 tablet, Rfl: 3   Microlet Lancets MISC, by Does not apply route. Test 3-4 times daily (Micorlet Colored Lancets), Disp: , Rfl:    mupirocin ointment (BACTROBAN) 2 %, Apply 1 Application topically 2 (two) times daily for 7 days. Apply a pea sized amount using end of your pinkie finger just inside the nose twice daily for 7  days, Disp: 22 g, Rfl: 0   Omega-3 Fatty Acids (OMEGA 3 500 PO), Take 1,000 mg by mouth., Disp: , Rfl:    pantoprazole (PROTONIX) 40 MG tablet, Take 1 tablet (40 mg total) by mouth daily. Take 30 minutes before breakfast., Disp: 90 tablet, Rfl: 3   rosuvastatin (CRESTOR) 10 MG tablet, TAKE 1 TABLET DAILY, Disp: 90 tablet, Rfl: 0   saline (AYR) GEL, Place 1 Application into both nostrils in the morning and at bedtime. Apply a pea sized amount using end of your pinkie finger just inside the nose twice daily until follow up, Disp: 14 g, Rfl: 1   SYNTHROID 88 MCG tablet, TAKE  1 TABLET DAILY BEFORE BREAKFAST, Disp: 90 tablet, Rfl: 0   topiramate (TOPAMAX) 50 MG tablet, Take 1 tablet (50 mg total) by mouth at bedtime., Disp: 90 tablet, Rfl: 2   triamcinolone cream (KENALOG) 0.1 %, Apply 1 application. topically 2 (two) times daily. (Patient taking differently: Apply 1 application  topically 2 (two) times daily. PRN), Disp: 30 g, Rfl: 0   venlafaxine XR (EFFEXOR-XR) 37.5 MG 24 hr capsule, TAKE 1 CAPSULE DAILY, Disp: 90 capsule, Rfl: 0   REZDIFFRA 80 MG TABS, Take 1 tablet by mouth daily., Disp: , Rfl:    Physical Exam:   Ht 5\' 5"  (1.651 m)   Wt 210 lb (95.3 kg)   BMI 34.95 kg/m   Salient findings:  CN II-XII intact  Bilateral EAC clear and TM intact with well pneumatized middle ear spaces Anterior rhinoscopy: Septum with small bundle of vessels more posteriorly on right, likely source of bleeding; mild prominent of septal vessels left anterior septum; bilateral inferior turbinates without significant hypertrophy; nasal endoscopy was indicated for comprehensive examination and documented below No lesions of oral cavity/oropharynx; no telangectasias or other vascular lesions noted No obviously palpable neck masses/lymphadenopathy/thyromegaly No respiratory distress or stridor  Seprately Identifiable Procedures:  PROCEDURE: Bilateral Diagnostic Rigid Nasal Endoscopy Pre-procedure diagnosis:  Epistaxis Post-procedure diagnosis: same Indication: See pre-procedure diagnosis and physical exam above Complications: None apparent EBL: 0 mL Anesthesia: Lidocaine 4% and topical decongestant was topically sprayed in each nasal cavity  Description of Procedure:  Patient was identified. A rigid 30 degree endoscope was utilized to evaluate the sinonasal cavities, mucosa, sinus ostia and turbinates and septum.  Overall, signs of mucosal inflammation are not noted.  Also noted are septum with small bundle of vessels more posteriorly but caudally on right, likely source of bleeding; mild prominent of septal vessels left anterior septum.  No mucopurulence, polyps, or masses noted.   Right Middle meatus: clear Right SE Recess: clear Left MM: clear Left SE Recess: clear No other lesions suspicious for bleeding or masses noted     Photodocumentation was obtained.  CPT CODE -- 40981 - Mod 25  PROCEDURE NOTE: Preoperative diagnosis: Right epistaxis Postoperative diagnosis: same  Procedure: control of nasal hemorrhage anterior, simple (CPT 19147) Indication: Epistaxis EBL: 0 mL Surgeon: Jovita Kussmaul, MD   Procedure: The patient was identified and properly positioned and verbal consent obtained. Topical lidocaine and Afrin were applied to the nasal cavity  bilaterally and allowed to work for 10 minutes. The area over the right anterior septum which was the suspected source of bleeding was focally cauterized using silver nitrate cautery with help of nasal speculum and headlight. There was no significant bleeding afterwards. Mupirocin ointment was applied over the area. Patient tolerated the procedure well  Impression & Plans:  Rimas Abdo is a 57 y.o. female on ASA 10 with:  1. Epistaxis   No other lesions beside tuft of vessels on right caudal septum. Endoscopy otherwise negative. This was cauterized.  - Recommend Mupirocin BID x7d - No nose blowing - Hold ASA 81 3 days - Ayr gel  (pea sized amount on end of finger) to right nostril and pinch x6 weeks - f/u 6 weeks - Epistaxis precautions discussed  - f/u 6 weeks  See below regarding exact medications prescribed this encounter including dosages and route: Meds ordered this encounter  Medications   mupirocin ointment (BACTROBAN) 2 %    Sig: Apply 1 Application topically 2 (two) times daily for 7 days. Apply a pea  sized amount using end of your pinkie finger just inside the nose twice daily for 7 days    Dispense:  22 g    Refill:  0   saline (AYR) GEL    Sig: Place 1 Application into both nostrils in the morning and at bedtime. Apply a pea sized amount using end of your pinkie finger just inside the nose twice daily until follow up    Dispense:  14 g    Refill:  1      Thank you for allowing me the opportunity to care for your patient. Please do not hesitate to contact me should you have any other questions.  Sincerely, Jovita Kussmaul, MD Otolarynoglogist (ENT), [facility] Phone: [phone] Fax: [phone]  12/01/2022, 12:16 PM   I have personally spent 50 minutes involved in face-to-face and non-face-to-face activities for this patient on the day of the visit.  Professional time spent includes the following activities, in addition to those noted in the documentation: preparing to see the patient (review of outside documentation), performing a medically appropriate examination and/or evaluation, counseling and educating the patient/family/caregiver, ordering medications, performing procedures (Endoscopy, cautery), referring and communicating with other healthcare professionals, documenting clinical information in the electronic or other health record, independently interpreting results and communicating results with the patient.

## 2022-11-27 ENCOUNTER — Ambulatory Visit: Payer: No Typology Code available for payment source | Admitting: Psychology

## 2022-11-27 DIAGNOSIS — F4323 Adjustment disorder with mixed anxiety and depressed mood: Secondary | ICD-10-CM

## 2022-11-29 NOTE — Progress Notes (Signed)
[facility] Counselor Initial Adult Exam  Name: Krista Dawson Date: 11/27/2022 MRN: [id number] DOB: 1965/02/13 PCP: Philip Aspen, Limmie Patricia, MD  Time spent: 60 minutes  Time in:  10:00  Time out:11:00  Guardian/Payee:  self  Paperwork requested: No   Reason for Visit /Presenting Problem: The patient came into the office for an in person visit today.  She was referred to deal with anxiety and stress related to her work.   Mental Status Exam: Appearance:   Casual     Behavior:  Appropriate  Motor:  Normal  Speech/Language:   Clear and Coherent  Affect:  Blunt  Mood:  sad  Thought process:  normal  Thought content:    WNL  Sensory/Perceptual disturbances:    WNL  Orientation:  oriented to person, place, time/date, and situation  Attention:  Good  Concentration:  Good  Memory:  WNL  Fund of knowledge:   Good  Insight:    Good  Judgment:   Good  Impulse Control:  Good     Reported Symptoms:  anxious, sad, hard time focusing, overwhelmed, hard time staying asleep, wakes up tired  Risk Assessment: Danger to Self:  No Self-injurious Behavior: No Danger to Others: No Duty to Warn:no Physical Aggression / Violence:No  Access to Firearms a concern: No  Gang Involvement:No  Patient / guardian was educated about steps to take if suicide or homicide risk level increases between visits: no While future psychiatric events cannot be accurately predicted, the patient does not currently require acute inpatient psychiatric care and does not currently meet [facility] involuntary commitment criteria.  Substance Abuse History: Current substance abuse: No     Past Psychiatric History:   Previous psychological history is significant for depression Outpatient Providers:none History of Psych Hospitalization: No  Psychological Testing: n/a  Abuse History:  Victim of: No., n/a Report needed: No. Victim of Neglect:No. Perpetrator of n/a Witness /  Exposure to Domestic Violence: No   Protective Services Involvement: No  Witness to [employer] Violence:  No   Family History:  Family History  Problem Relation Age of Onset   Breast cancer Mother    Colon polyps Mother    Stroke Mother 57   GER disease Father    Diabetes Father    Kidney disease Father    CAD Father    Other Father        cause of death listed as resp. failure   Colon polyps Sister    Stroke Sister    Diabetes Maternal Grandfather    Heart disease Maternal Grandfather    Diabetes Paternal Grandmother    CAD Paternal Grandfather    Pancreatic cancer Paternal Uncle    Pancreatic cancer Maternal Uncle    Colon cancer Neg Hx    Esophageal cancer Neg Hx    Stomach cancer Neg Hx    Rectal cancer Neg Hx     Living situation: the patient lives alone  Sexual Orientation: Straight  Relationship Status: single  Name of spouse / other:n/a If a parent, number of children / ages:n/a  Support Systems: friends  Financial Stress:  No   Income/Employment/Disability: Employment  Financial planner: No   Educational History: Education: Risk manager: Protestant  Any cultural differences that may affect / interfere with treatment:  not applicable   Recreation/Hobbies: reading  Stressors: Occupational concerns    Strengths: Supportive Relationships, Family, Friends, Spirituality, and Self Advocate  Barriers:  Patient may have difficulty  setting limits   Legal History: Pending legal issue / charges: The patient has no significant history of legal issues. History of legal issue / charges: n/a  Medical History/Surgical History: reviewed Past Medical History:  Diagnosis Date   Allergy    Asthma    exercise induced - not used inhaler couple of yrs pt reported 10-03-2018   Blood transfusion without reported diagnosis    Colon polyp    Complication of anesthesia    went into Vtach after breast reduction surgery 1988/89    Depression    DM (diabetes mellitus) (HCC)    GERD (gastroesophageal reflux disease)    HTN (hypertension)    Hyperlipidemia    Hypothyroidism    NASH (nonalcoholic steatohepatitis)    Obesity (BMI 30.0-34.9)    Post menopausal problems    Sleep apnea    no c-pap   Transaminitis    Vitamin D deficiency     Past Surgical History:  Procedure Laterality Date   ADRENALECTOMY Right    APPENDECTOMY     BIOPSY  12/12/2020   Procedure: BIOPSY;  Surgeon: Meridee Score Netty Starring., MD;  Location: [facility];  Service: Gastroenterology;;   BREAST REDUCTION SURGERY     CHOLECYSTECTOMY     ESOPHAGOGASTRODUODENOSCOPY N/A 12/12/2020   Procedure: ESOPHAGOGASTRODUODENOSCOPY (EGD);  Surgeon: Lemar Lofty., MD;  Location: [facility];  Service: Gastroenterology;  Laterality: N/A;   EUS N/A 12/12/2020   Procedure: UPPER ENDOSCOPIC ULTRASOUND (EUS) LINEAR;  Surgeon: Lemar Lofty., MD;  Location: [facility];  Service: Gastroenterology;  Laterality: N/A;   HYSTERECTOMY ABDOMINAL WITH SALPINGECTOMY     LIVER BIOPSY  2008, 2013   NASH   TONSILLECTOMY     UPPER GASTROINTESTINAL ENDOSCOPY      Medications: Current Outpatient Medications  Medication Sig Dispense Refill   aspirin EC 81 MG tablet Take 81 mg by mouth every evening.     Calcium-Magnesium (CAL-MAG PO) Take by mouth.     Cholecalciferol (VITAMIN D3) 50 MCG (2000 UT) TABS Take 2,000 Units by mouth in the morning.     Continuous Glucose Sensor (DEXCOM G7 SENSOR) MISC Change sensors every 10 days DX E11.65 9 each 3   dapagliflozin propanediol (FARXIGA) 10 MG TABS tablet Take 1 tablet (10 mg total) by mouth daily. 90 tablet 1   glipiZIDE (GLUCOTROL) 5 MG tablet TAKE 1 TABLET TWICE A DAY  BEFORE MEALS. 180 tablet 2   glucose blood (CONTOUR NEXT TEST) test strip Check blood sugar 1 time daily 100 each 12   hydrochlorothiazide (HYDRODIURIL) 25 MG tablet TAKE 1 TABLET DAILY 90 tablet 1   insulin glargine, 2 Unit Dial,  (TOUJEO MAX SOLOSTAR) 300 UNIT/ML Solostar Pen Inject 14 Units into the skin at bedtime. 15 mL 2   Insulin Pen Needle 32G X 4 MM MISC 1 Device by Does not apply route daily in the afternoon. 100 each 4   lisinopril (ZESTRIL) 20 MG tablet TAKE 1 TABLET DAILY 90 tablet 1   magnesium chloride (SLOW-MAG) 64 MG TBEC SR tablet Take 1 tablet by mouth daily.     Melatonin 10 MG CAPS Take 10 mg by mouth at bedtime.     metFORMIN (GLUCOPHAGE-XR) 500 MG 24 hr tablet Take 2 tablets (1,000 mg total) by mouth 2 (two) times daily with a meal. 360 tablet 3   Microlet Lancets MISC by Does not apply route. Test 3-4 times daily (Micorlet Colored Lancets)     mupirocin ointment (BACTROBAN) 2 % Apply 1 Application topically  2 (two) times daily for 7 days. Apply a pea sized amount using end of your pinkie finger just inside the nose twice daily for 7 days 22 g 0   Omega-3 Fatty Acids (OMEGA 3 500 PO) Take 1,000 mg by mouth.     pantoprazole (PROTONIX) 40 MG tablet Take 1 tablet (40 mg total) by mouth daily. Take 30 minutes before breakfast. 90 tablet 3   REZDIFFRA 80 MG TABS Take 1 tablet by mouth daily.     rosuvastatin (CRESTOR) 10 MG tablet TAKE 1 TABLET DAILY 90 tablet 0   saline (AYR) GEL Place 1 Application into both nostrils in the morning and at bedtime. Apply a pea sized amount using end of your pinkie finger just inside the nose twice daily until follow up 14 g 1   SYNTHROID 88 MCG tablet TAKE 1 TABLET DAILY BEFORE BREAKFAST 90 tablet 0   topiramate (TOPAMAX) 50 MG tablet Take 1 tablet (50 mg total) by mouth at bedtime. 90 tablet 2   triamcinolone cream (KENALOG) 0.1 % Apply 1 application. topically 2 (two) times daily. (Patient taking differently: Apply 1 application  topically 2 (two) times daily. PRN) 30 g 0   venlafaxine XR (EFFEXOR-XR) 37.5 MG 24 hr capsule TAKE 1 CAPSULE DAILY 90 capsule 0   No current facility-administered medications for this visit.    Allergies  Allergen Reactions   Trulicity  [Dulaglutide]     Pancreatitis    Spironolactone Hives and Swelling   Paxil [Paroxetine Hcl] Other (See Comments)    Other reaction(s): Other (comments) WORSENED DEPRESSION. WORSENED DEPRESSION.    Sulfa Antibiotics Hives and Itching   Codeine Itching and Rash    Diagnoses:  Adjustment disorder with mixed anxiety and depressed mood  Plan of Care: The plan of care is that the patient can benefit from individual therapy either weekly or biweekly.  She wants to come into the office for in person visits.  The patient will be working on decreasing her occupational stress.  Will develop full treatment plan during next visit.   Riddik Senna G Nishaan Stanke, LCSW

## 2022-12-03 LAB — HM MAMMOGRAPHY

## 2022-12-04 LAB — HM MAMMOGRAPHY

## 2022-12-05 ENCOUNTER — Encounter (HOSPITAL_COMMUNITY): Payer: Self-pay | Admitting: Internal Medicine

## 2022-12-05 ENCOUNTER — Ambulatory Visit (HOSPITAL_COMMUNITY)
Admission: RE | Admit: 2022-12-05 | Discharge: 2022-12-05 | Disposition: A | Discharge status: Home / Self Care | Payer: No Typology Code available for payment source | Attending: Internal Medicine | Admitting: Internal Medicine

## 2022-12-05 ENCOUNTER — Encounter (HOSPITAL_COMMUNITY)
Admission: RE | Disposition: A | Discharge status: Home / Self Care | Payer: Self-pay | Source: Home / Self Care | Attending: Internal Medicine

## 2022-12-05 DIAGNOSIS — K219 Gastro-esophageal reflux disease without esophagitis: Secondary | ICD-10-CM

## 2022-12-05 DIAGNOSIS — R1310 Dysphagia, unspecified: Secondary | ICD-10-CM | POA: Diagnosis present

## 2022-12-05 DIAGNOSIS — K224 Dyskinesia of esophagus: Secondary | ICD-10-CM | POA: Diagnosis not present

## 2022-12-05 DIAGNOSIS — R0789 Other chest pain: Secondary | ICD-10-CM

## 2022-12-05 HISTORY — PX: ESOPHAGEAL MANOMETRY: SHX5429

## 2022-12-05 SURGERY — MANOMETRY, ESOPHAGUS

## 2022-12-05 MED ORDER — LIDOCAINE VISCOUS HCL 2 % MT SOLN
OROMUCOSAL | Status: AC
  Filled 2022-12-05: qty 15

## 2022-12-05 SURGICAL SUPPLY — 2 items
FACESHIELD LNG OPTICON STERILE (SAFETY) IMPLANT
GLOVE BIO SURGEON STRL SZ8 (GLOVE) ×2 IMPLANT

## 2022-12-05 NOTE — Progress Notes (Signed)
Esophageal manometry performed per protocol.  Patient tolerated well.

## 2022-12-07 ENCOUNTER — Encounter (HOSPITAL_COMMUNITY): Payer: Self-pay | Admitting: Internal Medicine

## 2022-12-13 ENCOUNTER — Ambulatory Visit: Payer: No Typology Code available for payment source | Admitting: Psychology

## 2022-12-13 DIAGNOSIS — F4323 Adjustment disorder with mixed anxiety and depressed mood: Secondary | ICD-10-CM

## 2022-12-14 NOTE — Progress Notes (Signed)
[facility] Counselor/Therapist Progress Note  Patient ID: Krista Dawson, MRN: [id number],    Date: 12/13/2022  Time Spent: 60 minutes  Time In:  3:00  Time Out:  4:00  Treatment Type: Individual Therapy  Reported Symptoms: sadness,anxiety  Mental Status Exam: Appearance:  Casual     Behavior: Appropriate  Motor: Normal  Speech/Language:  Clear and Coherent  Affect: Blunt  Mood: normal  Thought process: normal  Thought content:   WNL  Sensory/Perceptual disturbances:   WNL  Orientation: oriented to person, place, time/date, and situation  Attention: Good  Concentration: Good  Memory: WNL  Fund of knowledge:  Good  Insight:   Good  Judgment:  Good  Impulse Control: Good   Risk Assessment: Danger to Self:  No Self-injurious Behavior: No Danger to Others: No Duty to Warn:no Physical Aggression / Violence:No  Access to Firearms a concern: No  Gang Involvement:No   Subjective: The patient attended a face-to-face individual therapy session in the office today.  The patient presents as pleasant and cooperative.  The patient reports that [name] went well and that she went up to [place] to serve and that was good for her.  We talked about work as that was what had initially brought her in for therapy.  We talked about her dynamics with one of her coworkers and how she is perpetuating that dynamic from issues that she had in her past.  The patient had some good insights about how she came to interact the way she does now.  We talked about specifically how to change some of the things she says to own her own voice and to not perpetuate the dynamic of someone can give me orders when they are at a peer of mine.  The patient gained some insight during this session and reported that she felt like the session was very helpful.  Interventions: Cognitive Behavioral Therapy, Assertiveness/Communication, and Insight-Oriented  Diagnosis:Adjustment  disorder with mixed anxiety and depressed mood  Plan: Client Abilities/Strengths  Intelligent, caring, insightful  Client Treatment Preferences  Outpatient individual therapy  Client Statement of Needs  "I need help to deal with all of the stressors going on that are contributing to my depression"  Treatment Level  Outpatient Individual therapy  Symptoms  Decrease or loss of appetite.: No Description Entered (Status: maintained). Depressed or irritable  mood.: No Description Entered (Status: maintained). Lack of energy.: No Description Entered (Status:  maintained).  Problems Addressed  Unipolar Depression, Unipolar Depression  Goals 1. Develop healthy thinking patterns and beliefs about self, others, and the world that lead to the alleviation and help prevent the relapse of  depression. Objective Learn and implement behavioral strategies to overcome depression. Target Date: 12/12/2023 Frequency: biWeekly Progress: 0 Modality: individual  Related Interventions 1. Assist the client in developing skills that increase the likelihood of deriving pleasure from  behavioral activation (e.g., assertiveness skills, developing an exercise plan, less internal/more  external focus, increased social involvement); reinforce success. Objective Learn and implement problem-solving and decision-making skills. Target Date: 12/12/2023  Frequency: biWeekly Progress: 0 Modality: individual Objective Verbalize an understanding and resolution of current interpersonal problems. Target Date: 12/12/2022 Frequency: biWeekly Progress: 0 Modality: individual Related Interventions 1. For interpersonal disputes, help the client explore the relationship, the nature of the dispute,  whether it has reached an impasse, and available options to resolve it including learning and  implementing conflict-resolution skills; if the relationship has reached an impasse, consider  ways to  change the impasse or to end the  relationship. Objective Identify and replace thoughts and beliefs that support depression. Target Date: 12/12/2023  Frequency: biWeekly Progress: 0 Modality: individual Related Interventions 1. Facilitate and reinforce the client's shift from biased depressive self-talk and beliefs to realitybased cognitive messages that enhance self-confidence and increase adaptive actions (see  "Positive Self-Talk" in the Adult Psychotherapy Homework Planner by Stephannie Li). Objective Describe current and past experiences with depression including their impact on functioning and  attempts to resolve it. Target Date: 2021-01-11 Frequency: biWeekly Progress: 0 Modality: individual  Related Interventions 1. Encourage the client to share his/her thoughts and feelings of depression; express empathy and  build rapport while identifying primary cognitive, behavioral, interpersonal, or other  contributors to depression. 2. Recognize, accept, and cope with feelings of depression. Diagnosis F43.23  Adjustment Disorder with mixed anxiety and depression Conditions For Discharge Achievement of treatment goals and objectives  Baptiste Littler G Amilyah Nack, LCSW

## 2022-12-17 ENCOUNTER — Other Ambulatory Visit: Payer: Self-pay | Admitting: Internal Medicine

## 2022-12-26 ENCOUNTER — Encounter: Payer: No Typology Code available for payment source | Admitting: Internal Medicine

## 2022-12-27 ENCOUNTER — Ambulatory Visit: Payer: No Typology Code available for payment source | Admitting: Psychology

## 2022-12-27 DIAGNOSIS — F4323 Adjustment disorder with mixed anxiety and depressed mood: Secondary | ICD-10-CM

## 2022-12-27 NOTE — Progress Notes (Signed)
[facility] Counselor/Therapist Progress Note  Patient ID: Krista Dawson, MRN: [id number],    Date: 12/27/2022  Time Spent: 60 minutes  Time In:  9:00  Time Out:  10:00  Treatment Type: Individual Therapy  Reported Symptoms: sadness,anxiety  Mental Status Exam: Appearance:  Casual     Behavior: Appropriate  Motor: Normal  Speech/Language:  Clear and Coherent  Affect: Blunt  Mood: normal  Thought process: normal  Thought content:   WNL  Sensory/Perceptual disturbances:   WNL  Orientation: oriented to person, place, time/date, and situation  Attention: Good  Concentration: Good  Memory: WNL  Fund of knowledge:  Good  Insight:   Good  Judgment:  Good  Impulse Control: Good   Risk Assessment: Danger to Self:  No Self-injurious Behavior: No Danger to Others: No Duty to Warn:no Physical Aggression / Violence:No  Access to Firearms a concern: No  Gang Involvement:No   Subjective: The patient attended a face-to-face individual therapy session in the office today.  The patient presents as pleasant and cooperative.  The patient reports that after our last session she felt like she really understood herself much better than she has before.  We talked about the insight that she had and how that affected how she interacted over the last week.  She reports that she was better able to set limits and boundaries and recognize how to set limits around the chaos that happens at times.  We talked today about the 4 tools of discipline and how to utilize those to help be more balanced.  The patient reports that she feels like therapy is really helping her get a better handle on herself and how to move forward. Interventions: Cognitive Behavioral Therapy, Assertiveness/Communication, and Insight-Oriented  Diagnosis:Adjustment disorder with mixed anxiety and depressed mood  Plan: Client Abilities/Strengths  Intelligent, caring, insightful  Client Treatment Preferences   Outpatient individual therapy  Client Statement of Needs  "I need help to deal with all of the stressors going on that are contributing to my depression"  Treatment Level  Outpatient Individual therapy  Symptoms  Decrease or loss of appetite.: No Description Entered (Status: maintained). Depressed or irritable  mood.: No Description Entered (Status: maintained). Lack of energy.: No Description Entered (Status:  maintained).  Problems Addressed  Unipolar Depression, Unipolar Depression  Goals 1. Develop healthy thinking patterns and beliefs about self, others, and the world that lead to the alleviation and help prevent the relapse of  depression. Objective Learn and implement behavioral strategies to overcome depression. Target Date: 12/12/2023 Frequency: biWeekly Progress: 0 Modality: individual  Related Interventions 1. Assist the client in developing skills that increase the likelihood of deriving pleasure from  behavioral activation (e.g., assertiveness skills, developing an exercise plan, less internal/more  external focus, increased social involvement); reinforce success. Objective Learn and implement problem-solving and decision-making skills. Target Date: 12/12/2023  Frequency: biWeekly Progress: 0 Modality: individual Objective Verbalize an understanding and resolution of current interpersonal problems. Target Date: 12/12/2023 Frequency: biWeekly Progress: 0 Modality: individual Related Interventions 1. For interpersonal disputes, help the client explore the relationship, the nature of the dispute,  whether it has reached an impasse, and available options to resolve it including learning and  implementing conflict-resolution skills; if the relationship has reached an impasse, consider  ways to change the impasse or to end the relationship. Objective Identify and replace thoughts and beliefs that support depression. Target Date: 12/12/2023  Frequency:  biWeekly Progress: 0 Modality: individual Related Interventions 1. Facilitate and reinforce the  client's shift from biased depressive self-talk and beliefs to realitybased cognitive messages that enhance self-confidence and increase adaptive actions (see  "Positive Self-Talk" in the Adult Psychotherapy Homework Planner by Stephannie Li). Objective Describe current and past experiences with depression including their impact on functioning and  attempts to resolve it. Target Date: 12/12/2023 Frequency: biWeekly Progress: 0 Modality: individual  Related Interventions 1. Encourage the client to share his/her thoughts and feelings of depression; express empathy and  build rapport while identifying primary cognitive, behavioral, interpersonal, or other  contributors to depression. 2. Recognize, accept, and cope with feelings of depression. Diagnosis F43.23  Adjustment Disorder with mixed anxiety and depression Conditions For Discharge Achievement of treatment goals and objectives  Chizara Mena G Esma Kilts, LCSW

## 2022-12-31 ENCOUNTER — Telehealth: Payer: Self-pay | Admitting: Gastroenterology

## 2022-12-31 NOTE — Telephone Encounter (Signed)
PT is calling to get an update on EM done 11/27. Please advise.

## 2023-01-03 ENCOUNTER — Other Ambulatory Visit: Payer: Self-pay

## 2023-01-03 NOTE — Telephone Encounter (Signed)
Patient is advised of the results and recommendations. She is on pantoprazole 40 mg daily. She uses a mail order pharmacy and receives 90 day supply. She wants to use the prescription she has now, taking it twice daily. Send the new prescription in after the first of the year (BID) so any PA is done in the new year.

## 2023-01-03 NOTE — Telephone Encounter (Signed)
Please inform patient that esophageal manometry is suggestive of distal esophageal spasm which can occur due to uncontrolled acid reflux, recommend increasing pantoprazole to 40 mg twice daily for 2 to 3 months to see if she has any improvement of symptoms, if has persistent issue, may consider repeat EGD with esophageal dilation or calcium channel blocker for symptom relief.  Please arrange follow-up visit in 2 to 3 months.  Thank you

## 2023-01-04 DIAGNOSIS — R0789 Other chest pain: Secondary | ICD-10-CM

## 2023-01-04 DIAGNOSIS — R1310 Dysphagia, unspecified: Secondary | ICD-10-CM

## 2023-01-04 DIAGNOSIS — K219 Gastro-esophageal reflux disease without esophagitis: Secondary | ICD-10-CM

## 2023-01-07 ENCOUNTER — Encounter (INDEPENDENT_AMBULATORY_CARE_PROVIDER_SITE_OTHER): Payer: Self-pay

## 2023-01-07 ENCOUNTER — Ambulatory Visit (INDEPENDENT_AMBULATORY_CARE_PROVIDER_SITE_OTHER): Payer: No Typology Code available for payment source | Admitting: Otolaryngology

## 2023-01-07 VITALS — BP 122/75 | HR 83 | Ht 65.5 in | Wt 210.0 lb

## 2023-01-07 DIAGNOSIS — R040 Epistaxis: Secondary | ICD-10-CM | POA: Diagnosis not present

## 2023-01-07 MED ORDER — AYR SALINE NASAL NA GEL: 1.0000 | Freq: Two times a day (BID) | NASAL | 10 refills | Status: DC

## 2023-01-07 NOTE — Progress Notes (Signed)
Dear Dr. Philip Aspen, Here is my assessment for our mutual patient, Krista Dawson. Thank you for allowing me the opportunity to care for your patient. Please do not hesitate to contact me should you have any other questions. Sincerely, Dr. Jovita Kussmaul  Otolaryngology Clinic Note Referring provider: Dr. Philip Aspen HPI:  Krista Dawson is a 57 y.o. female kindly referred by Dr. Philip Aspen for evaluation of right epistaxis  Initial visit (11/26/2022): Epistaxis history: nose bleeds on and off for years. More frequent recently. Has been months with increased frequency. GI asked her to look at it given upcoming esophageal manometry. It is Bothersome for her, and now occurring once a week. Always from right HTN: yes Frequency: once per week; few tablespoons Side: right Intervention: 10 mins, pressure stops it.  CKD/Liver dysfunction: no (NASH) Anticoagulation/AP: ASA 81 Trauma: no History of Sinusitis: no Nasal obstruction: no Nasal procedures: no Current nasal medication use: no ----------------------------------------------------------- We decided to perform cautery on the right side of the septum.  01/07/2023: She returns for follow up. She reports that she had one episode when she had manometry (had to go through the right side). One more bleed afterwards, have not had a bleed in about 2-3 weeks. On ASA 81. No other nasal symptoms - pain/pressure, hyposmia, obstruction, congestion. She is using ayr gel  --------------------------------------------------------------  H&N Surgery: Tonsillectomy Personal or FHx of bleeding dz or anesthesia difficulty: no  GLP-1: denies AP/AC: ASA 81  Tobacco: former smoker - quit 10 ys ago  PMHx: HLD, HTN, hypothyroidism, OSA, Obesity, T2DM, NASH, Dysphagia  Independent Review of Additional Tests or Records:  Esophagram 09/25/2022: no significant stricture, some dysmotility GI notes reviewed, referral notes  reviewed CBC 09/2022 :Hgb 13 PT/INR: 10/0.8 10/2022  PMH/Meds/All/SocHx/FamHx/ROS:   Past Medical History:  Diagnosis Date   Allergy    Asthma    exercise induced - not used inhaler couple of yrs pt reported 10-03-2018   Blood transfusion without reported diagnosis    Colon polyp    Complication of anesthesia    went into Vtach after breast reduction surgery 1988/89   Depression    DM (diabetes mellitus) (HCC)    GERD (gastroesophageal reflux disease)    HTN (hypertension)    Hyperlipidemia    Hypothyroidism    NASH (nonalcoholic steatohepatitis)    Obesity (BMI 30.0-34.9)    Post menopausal problems    Sleep apnea    no c-pap   Transaminitis    Vitamin D deficiency      Past Surgical History:  Procedure Laterality Date   ADRENALECTOMY Right    APPENDECTOMY     BIOPSY  12/12/2020   Procedure: BIOPSY;  Surgeon: Lemar Lofty., MD;  Location: [facility];  Service: Gastroenterology;;   BREAST REDUCTION SURGERY     CHOLECYSTECTOMY     ESOPHAGEAL MANOMETRY N/A 12/05/2022   Procedure: ESOPHAGEAL MANOMETRY (EM);  Surgeon: Beverley Fiedler, MD;  Location: [facility];  Service: Gastroenterology;  Laterality: N/A;   ESOPHAGOGASTRODUODENOSCOPY N/A 12/12/2020   Procedure: ESOPHAGOGASTRODUODENOSCOPY (EGD);  Surgeon: Lemar Lofty., MD;  Location: [facility];  Service: Gastroenterology;  Laterality: N/A;   EUS N/A 12/12/2020   Procedure: UPPER ENDOSCOPIC ULTRASOUND (EUS) LINEAR;  Surgeon: Lemar Lofty., MD;  Location: [facility];  Service: Gastroenterology;  Laterality: N/A;   HYSTERECTOMY ABDOMINAL WITH SALPINGECTOMY     LIVER BIOPSY  2008, 2013   NASH   TONSILLECTOMY     UPPER GASTROINTESTINAL ENDOSCOPY      Family  History  Problem Relation Age of Onset   Breast cancer Mother    Colon polyps Mother    Stroke Mother 4   GER disease Father    Diabetes Father    Kidney disease Father    CAD Father    Other Father        cause of death listed  as resp. failure   Colon polyps Sister    Stroke Sister    Diabetes Maternal Grandfather    Heart disease Maternal Grandfather    Diabetes Paternal Grandmother    CAD Paternal Grandfather    Pancreatic cancer Paternal Uncle    Pancreatic cancer Maternal Uncle    Colon cancer Neg Hx    Esophageal cancer Neg Hx    Stomach cancer Neg Hx    Rectal cancer Neg Hx      Social Connections: Moderately Integrated (04/30/2022)   Social Connection and Isolation Panel [NHANES]    Frequency of Communication with Friends and Family: Three times a week    Frequency of Social Gatherings with Friends and Family: Once a week    Attends Religious Services: 1 to 4 times per year    Active Member of [employer] or Organizations: Yes    Attends Engineer, structural: More than 4 times per year    Marital Status: Never married      Current Outpatient Medications:    aspirin EC 81 MG tablet, Take 81 mg by mouth every evening., Disp: , Rfl:    Calcium-Magnesium (CAL-MAG PO), Take by mouth., Disp: , Rfl:    Cholecalciferol (VITAMIN D3) 50 MCG (2000 UT) TABS, Take 2,000 Units by mouth in the morning., Disp: , Rfl:    Continuous Glucose Sensor (DEXCOM G7 SENSOR) MISC, Change sensors every 10 days DX E11.65, Disp: 9 each, Rfl: 3   dapagliflozin propanediol (FARXIGA) 10 MG TABS tablet, Take 1 tablet (10 mg total) by mouth daily., Disp: 90 tablet, Rfl: 1   glipiZIDE (GLUCOTROL) 5 MG tablet, TAKE 1 TABLET TWICE A DAY  BEFORE MEALS., Disp: 180 tablet, Rfl: 2   glucose blood (CONTOUR NEXT TEST) test strip, Check blood sugar 1 time daily, Disp: 100 each, Rfl: 12   hydrochlorothiazide (HYDRODIURIL) 25 MG tablet, TAKE 1 TABLET DAILY, Disp: 90 tablet, Rfl: 1   insulin glargine, 2 Unit Dial, (TOUJEO MAX SOLOSTAR) 300 UNIT/ML Solostar Pen, Inject 14 Units into the skin at bedtime., Disp: 15 mL, Rfl: 2   Insulin Pen Needle 32G X 4 MM MISC, 1 Device by Does not apply route daily in the afternoon., Disp: 100 each, Rfl: 4    lisinopril (ZESTRIL) 20 MG tablet, TAKE 1 TABLET DAILY, Disp: 90 tablet, Rfl: 1   magnesium chloride (SLOW-MAG) 64 MG TBEC SR tablet, Take 1 tablet by mouth daily., Disp: , Rfl:    Melatonin 10 MG CAPS, Take 10 mg by mouth at bedtime., Disp: , Rfl:    metFORMIN (GLUCOPHAGE-XR) 500 MG 24 hr tablet, Take 2 tablets (1,000 mg total) by mouth 2 (two) times daily with a meal., Disp: 360 tablet, Rfl: 3   Microlet Lancets MISC, by Does not apply route. Test 3-4 times daily (Micorlet Colored Lancets), Disp: , Rfl:    Omega-3 Fatty Acids (OMEGA 3 500 PO), Take 1,000 mg by mouth., Disp: , Rfl:    pantoprazole (PROTONIX) 40 MG tablet, Take 1 tablet (40 mg total) by mouth daily. Take 30 minutes before breakfast., Disp: 90 tablet, Rfl: 3   REZDIFFRA 80 MG TABS,  Take 1 tablet by mouth daily., Disp: , Rfl:    rosuvastatin (CRESTOR) 10 MG tablet, TAKE 1 TABLET DAILY, Disp: 90 tablet, Rfl: 1   SYNTHROID 88 MCG tablet, TAKE 1 TABLET DAILY BEFORE BREAKFAST, Disp: 90 tablet, Rfl: 0   topiramate (TOPAMAX) 50 MG tablet, Take 1 tablet (50 mg total) by mouth at bedtime., Disp: 90 tablet, Rfl: 2   triamcinolone cream (KENALOG) 0.1 %, Apply 1 application. topically 2 (two) times daily. (Patient taking differently: Apply 1 application  topically 2 (two) times daily. PRN), Disp: 30 g, Rfl: 0   venlafaxine XR (EFFEXOR-XR) 37.5 MG 24 hr capsule, TAKE 1 CAPSULE DAILY, Disp: 90 capsule, Rfl: 1   saline (AYR) GEL, Place 1 Application into both nostrils in the morning and at bedtime. Apply a pea sized amount using end of your pinkie finger just inside the nose twice daily until follow up, Disp: 14 g, Rfl: 10   Physical Exam:   BP 122/75 (BP Location: Right Arm, Patient Position: Sitting, Cuff Size: Normal)   Pulse 83   Ht 5' 5.5" (1.664 m)   Wt 210 lb (95.3 kg)   SpO2 94%   BMI 34.41 kg/m   Salient findings:  CN II-XII intact  Bilateral EAC clear and TM intact with well pneumatized middle ear spaces Anterior rhinoscopy:  Septum well healed over cauterized area; no significant tuft of vessels noted.  Mild prominent of septal vessels left anterior septum; bilateral inferior turbinates without significant hypertrophy; No evidence of epistaxis currently. No significant crusting noted. No lesions of oral cavity/oropharynx; no telangectasias or other vascular lesions noted No obviously palpable neck masses/lymphadenopathy/thyromegaly No respiratory distress or stridor  Seprately Identifiable Procedures:  PROCEDURE: Bilateral Diagnostic Rigid Nasal Endoscopy Prior:   Right septum cauterized prior --- Procedure: The patient was identified and properly positioned and verbal consent obtained. Topical lidocaine and Afrin were applied to the nasal cavity  bilaterally and allowed to work for 10 minutes. The area over the right anterior septum which was the suspected source of bleeding was focally cauterized using silver nitrate cautery with help of nasal speculum and headlight. There was no significant bleeding afterwards. Mupirocin ointment was applied over the area. Patient tolerated the procedure well  Impression & Plans:  Krista Dawson is a 57 y.o. female on ASA 80 with:  1. Epistaxis    No other lesions beside tuft of vessels on right caudal septum. Endoscopy otherwise negative. This was cauterized. She is doing well, with no bleeds over past 2-3 weeks.  - Continue Ayr gel (pea sized amount on end of finger) to right nostril - f/u PRN - Epistaxis precautions discussed    See below regarding exact medications prescribed this encounter including dosages and route: Meds ordered this encounter  Medications   saline (AYR) GEL    Sig: Place 1 Application into both nostrils in the morning and at bedtime. Apply a pea sized amount using end of your pinkie finger just inside the nose twice daily until follow up    Dispense:  14 g    Refill:  10      Thank you for allowing me the opportunity to care for your  patient. Please do not hesitate to contact me should you have any other questions.  Sincerely, Jovita Kussmaul, MD Otolarynoglogist (ENT), [facility] Phone: [phone] Fax: [phone]  01/07/2023, 9:29 AM   I have personally spent 25 minutes involved in face-to-face and non-face-to-face activities for this patient on the day of  the visit.  Professional time spent excludes any procedures performed but includes the following activities, in addition to those noted in the documentation: preparing to see the patient (review of outside documentation and results), performing a medically appropriate examination, counseling and educating, ordering medications (ayr gel), referring and communicating with other healthcare professionals, documenting clinical information in the electronic or other health record, and communicating results with the patient/family/caregiver

## 2023-01-08 ENCOUNTER — Ambulatory Visit: Payer: No Typology Code available for payment source | Admitting: Psychology

## 2023-01-08 DIAGNOSIS — F4323 Adjustment disorder with mixed anxiety and depressed mood: Secondary | ICD-10-CM | POA: Diagnosis not present

## 2023-01-08 NOTE — Progress Notes (Signed)
 [facility] Counselor/Therapist Progress Note  Patient ID: Krista Dawson, MRN: [id number],    Date: 01/08/2023  Time Spent: 60 minutes  Time In:  10:00  Time Out:  11:00  Treatment Type: Individual Therapy  Reported Symptoms: sadness,anxiety  Mental Status Exam: Appearance:  Casual     Behavior: Appropriate  Motor: Normal  Speech/Language:  Clear and Coherent  Affect: Blunt  Mood: normal  Thought process: normal  Thought content:   WNL  Sensory/Perceptual disturbances:   WNL  Orientation: oriented to person, place, time/date, and situation  Attention: Good  Concentration: Good  Memory: WNL  Fund of knowledge:  Good  Insight:   Good  Judgment:  Good  Impulse Control: Good   Risk Assessment: Danger to Self:  No Self-injurious Behavior: No Danger to Others: No Duty to Warn:no Physical Aggression / Violence:No  Access to Firearms a concern: No  Gang Involvement:No   Subjective: The patient attended a face-to-face individual therapy session in the office today.  The patient presents as pleasant and cooperative.  The patient reports that she feels like she is doing so much better since she started therapy.  She is doing a better job of setting limits.  She reports that she was able to stand up for herself with the person at work and that went well.  We talked some more about reacting versus responding.  We also talked about the importance of her learning more about herself and how she gets triggered.  She is going to read the book codependent no more and I recommended that she start doing some meditation and mindfulness so that she can work with herself to recognize when she is reacting. Interventions: Cognitive Behavioral Therapy, Assertiveness/Communication, and Insight-Oriented  Diagnosis:Adjustment disorder with mixed anxiety and depressed mood  Plan: Client Abilities/Strengths  Intelligent, caring, insightful  Client Treatment Preferences  Outpatient  individual therapy  Client Statement of Needs  I need help to deal with all of the stressors going on that are contributing to my depression  Treatment Level  Outpatient Individual therapy  Symptoms  Decrease or loss of appetite.: No Description Entered (Status: maintained). Depressed or irritable  mood.: No Description Entered (Status: maintained). Lack of energy.: No Description Entered (Status:  maintained).  Problems Addressed  Unipolar Depression, Unipolar Depression  Goals 1. Develop healthy thinking patterns and beliefs about self, others, and the world that lead to the alleviation and help prevent the relapse of  depression. Objective Learn and implement behavioral strategies to overcome depression. Target Date: 12/12/2023 Frequency: biWeekly Progress: 10 Modality: individual  Related Interventions 1. Assist the client in developing skills that increase the likelihood of deriving pleasure from  behavioral activation (e.g., assertiveness skills, developing an exercise plan, less internal/more  external focus, increased social involvement); reinforce success. Objective Learn and implement problem-solving and decision-making skills. Target Date: 12/12/2023  Frequency: biWeekly Progress: 10 Modality: individual Objective Verbalize an understanding and resolution of current interpersonal problems. Target Date: 12/12/2023 Frequency: biWeekly Progress: 0 Modality: individual Related Interventions 1. For interpersonal disputes, help the client explore the relationship, the nature of the dispute,  whether it has reached an impasse, and available options to resolve it including learning and  implementing conflict-resolution skills; if the relationship has reached an impasse, consider  ways to change the impasse or to end the relationship. Objective Identify and replace thoughts and beliefs that support depression. Target Date: 12/12/2023  Frequency: biWeekly Progress: 10  Modality: individual Related Interventions 1. Facilitate and reinforce the client's  shift from biased depressive self-talk and beliefs to realitybased cognitive messages that enhance self-confidence and increase adaptive actions (see  Positive Self-Talk in the Adult Psychotherapy Homework Planner by Jenniffer). Objective Describe current and past experiences with depression including their impact on functioning and  attempts to resolve it. Target Date: 12/12/2023 Frequency: biWeekly Progress: 10 Modality: individual  Related Interventions 1. Encourage the client to share his/her thoughts and feelings of depression; express empathy and  build rapport while identifying primary cognitive, behavioral, interpersonal, or other  contributors to depression. 2. Recognize, accept, and cope with feelings of depression. Diagnosis F43.23  Adjustment Disorder with mixed anxiety and depression Conditions For Discharge Achievement of treatment goals and objectives  Krista Leazer G Zalma Channing, LCSW                  Krista Cheatum G Callin Ashe, LCSW

## 2023-01-10 ENCOUNTER — Other Ambulatory Visit: Payer: Self-pay

## 2023-01-10 MED ORDER — PANTOPRAZOLE SODIUM 40 MG PO TBEC: 40.0000 mg | DELAYED_RELEASE_TABLET | Freq: Every day | ORAL | 3 refills | Status: DC

## 2023-01-10 NOTE — Telephone Encounter (Signed)
 Patient called stating her pantoprazole is placed on hold due to dosage, she stated it should be twice a day.

## 2023-01-10 NOTE — Telephone Encounter (Signed)
 Looked back in the chart and only see Protonix 40mg  daily dosing. Pt states she has been taking it BID. Please advise regarding refilling medication for BID dosing.

## 2023-01-11 ENCOUNTER — Other Ambulatory Visit: Payer: Self-pay

## 2023-01-11 MED ORDER — PANTOPRAZOLE SODIUM 40 MG PO TBEC: 40.0000 mg | DELAYED_RELEASE_TABLET | Freq: Two times a day (BID) | ORAL | 3 refills | Status: DC

## 2023-01-11 NOTE — Telephone Encounter (Signed)
 Yes, please BID

## 2023-01-11 NOTE — Telephone Encounter (Signed)
Prescription sent to pharmacy for pt 

## 2023-01-22 ENCOUNTER — Ambulatory Visit: Payer: No Typology Code available for payment source | Admitting: Psychology

## 2023-01-22 DIAGNOSIS — F4323 Adjustment disorder with mixed anxiety and depressed mood: Secondary | ICD-10-CM | POA: Diagnosis not present

## 2023-01-22 NOTE — Progress Notes (Addendum)
 [facility] Counselor/Therapist Progress Note  Patient ID: Krista Dawson, MRN: [id number],    Date: 01/22/2023  Time Spent: 55 minutes  Time In:  12:05  Time Out:  1:00  Treatment Type: Individual Therapy  Reported Symptoms: sadness,anxiety  Mental Status Exam: Appearance:  Casual     Behavior: Appropriate  Motor: Normal  Speech/Language:  Clear and Coherent  Affect: Blunt  Mood: normal  Thought process: normal  Thought content:   WNL  Sensory/Perceptual disturbances:   WNL  Orientation: oriented to person, place, time/date, and situation  Attention: Good  Concentration: Good  Memory: WNL  Fund of knowledge:  Good  Insight:   Good  Judgment:  Good  Impulse Control: Good   Risk Assessment: Danger to Self:  No Self-injurious Behavior: No Danger to Others: No Duty to Warn:no Physical Aggression / Violence:No  Access to Firearms a concern: No  Gang Involvement:No   Subjective: The patient attended a face-to-face individual therapy session in the office today.  The patient presents as pleasant and cooperative.  The patient reports that she feels a little blah today.  She talked today about some things that are going on at work and we processed different ways to look at the situations.  One of the situations is that the girl that she has had trouble with has somehow invited herself to go on an event that the patient is responsible for this weekend and she is feeling anxious and stressed about that.  We talked about how her offering to share a room with her probably was an open invitation for her to invite herself to come.  We talked about her being more mindful about her people pleasing skills.  In addition we talked about situations that have continued to perpetuate her feeling that she thinks other people do not think she knows what she is doing.  And we talked about these situations really being more about them feeling like they want to be in control or that  they have more knowledge because they have been doing it longer than she has.  We did some reframing and she reports that she felt better after the session.. Interventions: Cognitive Behavioral Therapy, Assertiveness/Communication, and Insight-Oriented  Diagnosis:Adjustment disorder with mixed anxiety and depressed mood  Plan: Client Abilities/Strengths  Intelligent, caring, insightful  Client Treatment Preferences  Outpatient individual therapy  Client Statement of Needs  I need help to deal with all of the stressors going on that are contributing to my depression  Treatment Level  Outpatient Individual therapy  Symptoms  Decrease or loss of appetite.: No Description Entered (Status: maintained). Depressed or irritable  mood.: No Description Entered (Status: maintained). Lack of energy.: No Description Entered (Status:  maintained).  Problems Addressed  Unipolar Depression, Unipolar Depression  Goals 1. Develop healthy thinking patterns and beliefs about self, others, and the world that lead to the alleviation and help prevent the relapse of  depression. Objective Learn and implement behavioral strategies to overcome depression. Target Date: 12/12/2023 Frequency: biWeekly Progress: 20 Modality: individual  Related Interventions 1. Assist the client in developing skills that increase the likelihood of deriving pleasure from  behavioral activation (e.g., assertiveness skills, developing an exercise plan, less internal/more  external focus, increased social involvement); reinforce success. Objective Learn and implement problem-solving and decision-making skills. Target Date: 12/12/2023 Frequency: biWeekly Progress: 20 Modality: individual Objective Verbalize an understanding and resolution of current interpersonal problems. Target Date: 12/12/2023 Frequency: biWeekly Progress: 0 Modality: individual Related Interventions 1. For  interpersonal disputes, help the client explore  the relationship, the nature of the dispute,  whether it has reached an impasse, and available options to resolve it including learning and  implementing conflict-resolution skills; if the relationship has reached an impasse, consider  ways to change the impasse or to end the relationship. Objective Identify and replace thoughts and beliefs that support depression. Target Date: 12/12/2023 Frequency: biWeekly Progress: 20 Modality: individual Related Interventions 1. Facilitate and reinforce the client's shift from biased depressive self-talk and beliefs to realitybased cognitive messages that enhance self-confidence and increase adaptive actions (see  Positive Self-Talk in the Adult Psychotherapy Homework Planner by Jenniffer). Objective Describe current and past experiences with depression including their impact on functioning and  attempts to resolve it. Target Date: 12/12/2023 Frequency: biWeekly Progress: 10 Modality: individual  Related Interventions 1. Encourage the client to share his/her thoughts and feelings of depression; express empathy and  build rapport while identifying primary cognitive, behavioral, interpersonal, or other  contributors to depression. 2. Recognize, accept, and cope with feelings of depression. Diagnosis F43.23  Adjustment Disorder with mixed anxiety and depression Conditions For Discharge Achievement of treatment goals and objectives  Saliou Barnier G Laelle Bridgett, LCSW

## 2023-01-29 ENCOUNTER — Ambulatory Visit: Payer: No Typology Code available for payment source | Admitting: Internal Medicine

## 2023-01-29 VITALS — BP 112/80 | HR 89 | Resp 20 | Ht 65.5 in | Wt 205.4 lb

## 2023-01-29 DIAGNOSIS — E1165 Type 2 diabetes mellitus with hyperglycemia: Secondary | ICD-10-CM | POA: Diagnosis not present

## 2023-01-29 DIAGNOSIS — Z794 Long term (current) use of insulin: Secondary | ICD-10-CM

## 2023-01-29 DIAGNOSIS — Z7984 Long term (current) use of oral hypoglycemic drugs: Secondary | ICD-10-CM | POA: Diagnosis not present

## 2023-01-29 DIAGNOSIS — E039 Hypothyroidism, unspecified: Secondary | ICD-10-CM

## 2023-01-29 DIAGNOSIS — Z7985 Long-term (current) use of injectable non-insulin antidiabetic drugs: Secondary | ICD-10-CM | POA: Diagnosis not present

## 2023-01-29 DIAGNOSIS — E785 Hyperlipidemia, unspecified: Secondary | ICD-10-CM | POA: Diagnosis not present

## 2023-01-29 DIAGNOSIS — E1169 Type 2 diabetes mellitus with other specified complication: Secondary | ICD-10-CM

## 2023-01-29 LAB — POCT GLYCOSYLATED HEMOGLOBIN (HGB A1C): Hemoglobin A1C: 8.4 % — AB (ref 4.0–5.6)

## 2023-01-29 MED ORDER — TOPIRAMATE 50 MG PO TABS: 50.0000 mg | ORAL_TABLET | Freq: Every day | ORAL | 3 refills | Status: DC

## 2023-01-29 MED ORDER — TOUJEO MAX SOLOSTAR 300 UNIT/ML ~~LOC~~ SOPN: 16.0000 [IU] | PEN_INJECTOR | Freq: Every day | SUBCUTANEOUS | 3 refills | Status: DC

## 2023-01-29 MED ORDER — GLIMEPIRIDE 2 MG PO TABS: 2.0000 mg | ORAL_TABLET | Freq: Every day | ORAL | 3 refills | Status: DC

## 2023-01-29 MED ORDER — DAPAGLIFLOZIN PROPANEDIOL 10 MG PO TABS: 10.0000 mg | ORAL_TABLET | Freq: Every day | ORAL | 1 refills | Status: DC

## 2023-01-29 MED ORDER — METFORMIN HCL ER 500 MG PO TB24: 1000.0000 mg | ORAL_TABLET | Freq: Two times a day (BID) | ORAL | 3 refills | Status: DC

## 2023-01-29 MED ORDER — INSULIN PEN NEEDLE 32G X 4 MM MISC: 1.0000 | Freq: Every day | 4 refills | Status: DC

## 2023-01-29 NOTE — Patient Instructions (Addendum)
Increase  Toujeo 16 units daily  Continue Metformin 500 mg XR 2 tablets twice daily  Continue Farxiga 10 mg daily  Stop Glipizide 5 mg Start Glimepiride 2 mg , 1 tablet before breakfast     HOW TO TREAT LOW BLOOD SUGARS (Blood sugar LESS THAN 70 MG/DL) Please follow the RULE OF 15 for the treatment of hypoglycemia treatment (when your (blood sugars are less than 70 mg/dL)   STEP 1: Take 15 grams of carbohydrates when your blood sugar is low, which includes:  3-4 GLUCOSE TABS  OR 3-4 OZ OF JUICE OR REGULAR SODA OR ONE TUBE OF GLUCOSE GEL    STEP 2: RECHECK blood sugar in 15 MINUTES STEP 3: If your blood sugar is still low at the 15 minute recheck --> then, go back to STEP 1 and treat AGAIN with another 15 grams of carbohydrates.    [employer] [facility]  Nutritionist in [place], [place] Address: [address] [place], [place] [address]  Phone: [phone]

## 2023-01-29 NOTE — Progress Notes (Signed)
Name: Krista Dawson  MRN/ DOB: [id number], March 18, 1965   Age/ Sex: 58 y.o., female    PCP: Philip Aspen, Limmie Patricia, MD   Reason for Endocrinology Evaluation: Type 2 Diabetes Mellitus     Date of Initial Endocrinology Visit: 11/03/2021    PATIENT IDENTIFIER: [name] is a 58 y.o. female with a past medical history of HTN, NASH, Hx pancreatitis, and hypothyroidism. The patient presented for initial endocrinology clinic visit on 11/03/2021  for consultative assistance with her diabetes management.    HPI: [name] was    Diagnosed with DM 2005 Prior Medications tried/Intolerance: trulicity 1.5 mg  - abdominal pain with elevated lipase but no pancreatitis diagnosis . Insulin started 08/2021          Hemoglobin A1c has ranged from 6.0% in 2022, peaking at 8.6% in 2020.  The patient was on Trulicity until June 2022 when she developed severe abdominal pain, her lipase has been consistently elevated but there was no evidence of pancreatitis Due to persistent elevation in lipase she was seen by [facility]    Has family history of pancreatic cancer  Both parents with thyroid  disease    On her initial visit to our clinic she had an A1c of 7.9% she was on basal insulin, metformin, and Farxiga which we adjusted  GAD-65 and islet cell antibody were undetectable   Started glipizide and Topamax for weight loss 09/2022 with an A1c of 8.4%  SUBJECTIVE:   During the last visit (09/18/2022): A1c 8.4%     Today (01/29/23): [name] is here for follow-up on diabetes management.  She checks her blood sugars multiple times daily. The patient has not had hypoglycemic episodes since the last clinic visit.  She continues to follow with GI for NASH She has been following up with behavioral health for anxiety She believes topamax  has helped with cravings   Not consistent with  glipizide  No tingling or numbness  Denies nausea or  vomiting  Denies  constipation or diarrhea    HOME DIABETES REGIMEN: Metformin 500 mg XR  2 tabs BID  Glipizide 5 mg BID Farxiga 10 mg daily  Toujeo 14 units daily daily  Topamax 50 mg nightly    Statin: yes ACE-I/ARB: yes    CONTINUOUS GLUCOSE MONITORING RECORD INTERPRETATION    Dates of Recording: 1/8-1/21/2025  Sensor description:dexcom  Results statistics:   CGM use % of time 91  Average and SD 185/48  Time in range 45%  % Time Above 180 45  % Time above 250 10  % Time Below target 0   Glycemic patterns summary: BGs are elevated throughout the night and trend down during the day  Hyperglycemic episodes overnight  Hypoglycemic episodes occurred N/A  Overnight periods: High    DIABETIC COMPLICATIONS: Microvascular complications:  cataract Denies: CKD , retinopathy, neuropathy  Last eye exam: Completed 05/2021  Macrovascular complications:   Denies: CAD, PVD, CVA   PAST HISTORY: Past Medical History:  Past Medical History:  Diagnosis Date   Allergy    Asthma    exercise induced - not used inhaler couple of yrs pt reported 10-03-2018   Blood transfusion without reported diagnosis    Colon polyp    Complication of anesthesia    went into Vtach after breast reduction surgery 1988/89   Depression    DM (diabetes mellitus) (HCC)    GERD (gastroesophageal reflux disease)    HTN (hypertension)    Hyperlipidemia    Hypothyroidism  NASH (nonalcoholic steatohepatitis)    Obesity (BMI 30.0-34.9)    Post menopausal problems    Sleep apnea    no c-pap   Transaminitis    Vitamin D deficiency    Past Surgical History:  Past Surgical History:  Procedure Laterality Date   ADRENALECTOMY Right    APPENDECTOMY     BIOPSY  12/12/2020   Procedure: BIOPSY;  Surgeon: Lemar Lofty., MD;  Location: [facility];  Service: Gastroenterology;;   BREAST REDUCTION SURGERY     CHOLECYSTECTOMY     ESOPHAGEAL MANOMETRY N/A 12/05/2022   Procedure: ESOPHAGEAL MANOMETRY  (EM);  Surgeon: Beverley Fiedler, MD;  Location: [facility];  Service: Gastroenterology;  Laterality: N/A;   ESOPHAGOGASTRODUODENOSCOPY N/A 12/12/2020   Procedure: ESOPHAGOGASTRODUODENOSCOPY (EGD);  Surgeon: Lemar Lofty., MD;  Location: [facility];  Service: Gastroenterology;  Laterality: N/A;   EUS N/A 12/12/2020   Procedure: UPPER ENDOSCOPIC ULTRASOUND (EUS) LINEAR;  Surgeon: Lemar Lofty., MD;  Location: [facility];  Service: Gastroenterology;  Laterality: N/A;   HYSTERECTOMY ABDOMINAL WITH SALPINGECTOMY     LIVER BIOPSY  2008, 2013   NASH   TONSILLECTOMY     UPPER GASTROINTESTINAL ENDOSCOPY      Social History:  reports that she quit smoking about 16 years ago. Her smoking use included cigarettes. She has never used smokeless tobacco. She reports current alcohol use. She reports that she does not use drugs. Family History:  Family History  Problem Relation Age of Onset   Breast cancer Mother    Colon polyps Mother    Stroke Mother 25   GER disease Father    Diabetes Father    Kidney disease Father    CAD Father    Other Father        cause of death listed as resp. failure   Colon polyps Sister    Stroke Sister    Diabetes Maternal Grandfather    Heart disease Maternal Grandfather    Diabetes Paternal Grandmother    CAD Paternal Grandfather    Pancreatic cancer Paternal Uncle    Pancreatic cancer Maternal Uncle    Colon cancer Neg Hx    Esophageal cancer Neg Hx    Stomach cancer Neg Hx    Rectal cancer Neg Hx      HOME MEDICATIONS: Allergies as of 01/29/2023       Reactions   Trulicity [dulaglutide]    Pancreatitis   Spironolactone Hives, Swelling   Paxil [paroxetine Hcl] Other (See Comments)   Other reaction(s): Other (comments) WORSENED DEPRESSION. WORSENED DEPRESSION.   Sulfa Antibiotics Hives, Itching   Codeine Itching, Rash        Medication List        Accurate as of January 29, 2023  9:34 AM. If you have any questions, ask  your nurse or doctor.          aspirin EC 81 MG tablet Take 81 mg by mouth every evening.   CAL-MAG PO Take by mouth.   Contour Next Test test strip Generic drug: glucose blood Check blood sugar 1 time daily   dapagliflozin propanediol 10 MG Tabs tablet Commonly known as: Farxiga Take 1 tablet (10 mg total) by mouth daily.   Dexcom G7 Sensor Misc Change sensors every 10 days DX E11.65   Glucotrol 5 MG tablet Generic drug: glipiZIDE TAKE 1 TABLET TWICE A DAY  BEFORE MEALS.   hydrochlorothiazide 25 MG tablet Commonly known as: HYDRODIURIL TAKE 1 TABLET DAILY   Insulin  Pen Needle 32G X 4 MM Misc 1 Device by Does not apply route daily in the afternoon.   lisinopril 20 MG tablet Commonly known as: ZESTRIL TAKE 1 TABLET DAILY   magnesium chloride 64 MG Tbec SR tablet Commonly known as: SLOW-MAG Take 1 tablet by mouth daily.   Melatonin 10 MG Caps Take 10 mg by mouth at bedtime.   metFORMIN 500 MG 24 hr tablet Commonly known as: GLUCOPHAGE-XR Take 2 tablets (1,000 mg total) by mouth 2 (two) times daily with a meal.   Microlet Lancets Misc by Does not apply route. Test 3-4 times daily (Micorlet Colored Lancets)   OMEGA 3 500 PO Take 1,000 mg by mouth.   pantoprazole 40 MG tablet Commonly known as: PROTONIX Take 1 tablet (40 mg total) by mouth 2 (two) times daily before a meal.   Rezdiffra 80 MG Tabs Generic drug: Resmetirom Take 1 tablet by mouth daily.   rosuvastatin 10 MG tablet Commonly known as: CRESTOR TAKE 1 TABLET DAILY   saline Gel Place 1 Application into both nostrils in the morning and at bedtime. Apply a pea sized amount using end of your pinkie finger just inside the nose twice daily until follow up   Synthroid 88 MCG tablet Generic drug: levothyroxine TAKE 1 TABLET DAILY BEFORE BREAKFAST   topiramate 50 MG tablet Commonly known as: Topamax Take 1 tablet (50 mg total) by mouth at bedtime.   Toujeo Max SoloStar 300 UNIT/ML Solostar  Pen Generic drug: insulin glargine (2 Unit Dial) Inject 14 Units into the skin at bedtime.   triamcinolone cream 0.1 % Commonly known as: KENALOG Apply 1 application. topically 2 (two) times daily. What changed: additional instructions   venlafaxine XR 37.5 MG 24 hr capsule Commonly known as: EFFEXOR-XR TAKE 1 CAPSULE DAILY   Vitamin D3 50 MCG (2000 UT) Tabs Take 2,000 Units by mouth in the morning.         ALLERGIES: Allergies  Allergen Reactions   Trulicity [Dulaglutide]     Pancreatitis    Spironolactone Hives and Swelling   Paxil [Paroxetine Hcl] Other (See Comments)    Other reaction(s): Other (comments) WORSENED DEPRESSION. WORSENED DEPRESSION.    Sulfa Antibiotics Hives and Itching   Codeine Itching and Rash     REVIEW OF SYSTEMS: A comprehensive ROS was conducted with the patient and is negative except as per HPI    OBJECTIVE:   VITAL SIGNS: BP 112/80 (BP Location: Left Arm, Patient Position: Sitting, Cuff Size: Large)   Pulse 89   Resp 20   Ht 5' 5.5" (1.664 m)   Wt 205 lb 6.4 oz (93.2 kg)   SpO2 98%   BMI 33.66 kg/m    PHYSICAL EXAM:  General: Pt appears well and is in NAD  Neck:  Thyroid: Thyroid size normal.  No goiter or nodules appreciated.   Lungs: Clear with good BS bilat   Heart: RRR   Extremities:  Lower extremities - No pretibial edema.   Neuro: MS is good with appropriate affect, pt is alert and Ox3    DM foot exam: 09/18/2022  The skin of the feet is intact without sores or ulcerations. The pedal pulses are 2+ on right and 2+ on left. The sensation is intact to a screening 5.07, 10 gram monofilament bilaterally     DATA REVIEWED:  Lab Results  Component Value Date   HGBA1C 8.4 (A) 09/18/2022   HGBA1C 8.2 (A) 06/26/2022   HGBA1C 7.5 (A) 03/09/2022  Latest Reference Range & Units 09/18/22 10:07  Sodium 135 - 145 mEq/L 139  Potassium 3.5 - 5.1 mEq/L 4.3  Chloride 96 - 112 mEq/L 107  CO2 19 - 32 mEq/L 22  Glucose  70 - 99 mg/dL 829 (H)  BUN 6 - 23 mg/dL 25 (H)  Creatinine 5.62 - 1.20 mg/dL 1.30  Calcium 8.4 - 86.5 mg/dL 78.4  GFR >69.62 mL/min 72.08  Total CHOL/HDL Ratio  3  Cholesterol 0 - 200 mg/dL 952  HDL Cholesterol >84.13 mg/dL 24.40 (L)  LDL (calc) 0 - 99 mg/dL 45  MICROALB/CREAT RATIO 0.0 - 30.0 mg/g 2.2  NonHDL  84.86  Triglycerides 0.0 - 149.0 mg/dL 102.7 (H)  VLDL 0.0 - 25.3 mg/dL 66.4     Latest Reference Range & Units 09/18/22 10:07  TSH 0.35 - 5.50 uIU/mL 2.45      Latest Reference Range & Units 09/18/22 10:07  Creatinine,U mg/dL 40.3  Microalb, Ur 0.0 - 1.9 mg/dL 0.9  MICROALB/CREAT RATIO 0.0 - 30.0 mg/g 2.2     ASSESSMENT / PLAN / RECOMMENDATIONS:   1) Type 2 Diabetes Mellitus, With improving glycemic control, Without  complications - Most recent A1c of 8.4 %. Goal A1c < 7.0 %.     -Despite the A1c remaining at the same level, the patient has been noted with weight loss, and her average BG's has trended down from 221 Mg/DL from September, 4742 to 185 Mg/DL during  this visit -Patient not a candidate for GLP-1 agonist nor DPP 4 inhibitors due to history of pancreatitis while on Trulicity -GAD-65 and islet cell antibody negative  -She has not been able to consistently take glipizide twice daily, I have recommended switching to glimepiride that way she can take it once daily before the first meal of the day -I will increase Toujeo as below   MEDICATIONS: Increase Toujeo 16 units daily Stop glipizide 5 mg twice daily Start glimepiride 2 mg, 1 tablet daily Continue metformin 500 mg XR 2 tabs BID Continue Farxiga 10 mg daily  EDUCATION / INSTRUCTIONS: BG monitoring instructions: Patient is instructed to check her blood sugars  3 times a day, before meals . Call [facility] if: BG persistently < 70  I reviewed the Rule of 15 for the treatment of hypoglycemia in detail with the patient. Literature supplied.   2) Diabetic complications:  Eye: Does  not have known diabetic retinopathy.  Neuro/ Feet: Does not have known diabetic peripheral neuropathy. Renal: Patient does not have known baseline CKD. She is  on an ACEI/ARB at present.     3)Hypothyroidism:   -TSH normal  Medication Continue levothyroxine 88 mcg daily  4) Dyslipidemia:  -TG elevated, LDL at goal -No changes at this time, I suspect this would improve with improving glycemic control -Will continue to monitor   Medication  Rosuvastatin 20 mg daily   5) Weight Loss  -Patient has been noted with weight loss, she attributes this to being more active as well as topiramate -She would like to remain on current dose of topiramate  Medication Continue topiramate 50 mg, 1  tablet at bedtime daily   Follow-up in 3 months   Signed electronically by: Lyndle Herrlich, MD  [facility]  [facility] [address] [place], [place] [address] Phone: [phone] FAX: [phone]   CC: Philip Aspen, Limmie Patricia, MD [address] [place] [address] Phone: [phone]  Fax: [phone]    Return to Endocrinology clinic  as below: Future Appointments  Date Time Provider Department Center  02/07/2023 12:00 PM Cottle, Lynnell Dike, LCSW [facility] None  02/19/2023  9:30 AM Philip Aspen, Limmie Patricia, MD [facility]  02/21/2023 12:00 PM Cottle, Lynnell Dike, LCSW [facility] None  03/11/2023 11:30 AM Arnaldo Natal, NP [facility]

## 2023-01-31 ENCOUNTER — Encounter: Payer: Self-pay | Admitting: Internal Medicine

## 2023-02-07 ENCOUNTER — Ambulatory Visit (INDEPENDENT_AMBULATORY_CARE_PROVIDER_SITE_OTHER): Payer: No Typology Code available for payment source | Admitting: Psychology

## 2023-02-07 DIAGNOSIS — F4323 Adjustment disorder with mixed anxiety and depressed mood: Secondary | ICD-10-CM

## 2023-02-10 NOTE — Progress Notes (Signed)
[facility] Counselor/Therapist Progress Note  Patient ID: Krista Dawson, MRN: [id number],    Date: 02/07/2023  Time Spent: 56 minutes  Time In:  12:04  Time Out:  1:00  Treatment Type: Individual Therapy  Reported Symptoms: sadness,anxiety  Mental Status Exam: Appearance:  Casual     Behavior: Appropriate  Motor: Normal  Speech/Language:  Clear and Coherent  Affect: Blunt  Mood: normal  Thought process: normal  Thought content:   WNL  Sensory/Perceptual disturbances:   WNL  Orientation: oriented to person, place, time/date, and situation  Attention: Good  Concentration: Good  Memory: WNL  Fund of knowledge:  Good  Insight:   Good  Judgment:  Good  Impulse Control: Good   Risk Assessment: Danger to Self:  No Self-injurious Behavior: No Danger to Others: No Duty to Warn:no Physical Aggression / Violence:No  Access to Firearms a concern: No  Gang Involvement:No   Subjective: The patient attended a face-to-face individual therapy session in the office today.  The patient presents as pleasant and cooperative.  The patient reports that she has had some issues at work over the last 2 weeks.  She reports that she also is sad because her aunt's husband passed away.  We talked about responding versus reacting as she has been doing some reacting in the last few weeks at her job.  We talked about how we want her to take a deep breath and activate her parasympathetic nervous system and be able to get herself some time to think about how she is going to respond.  She realizes that she is responding out of some of her past experiences with her father and we talked about those.  The patient is very happy with the therapy she is receiving and she is making progress.  Interventions: Cognitive Behavioral Therapy, Assertiveness/Communication, and Insight-Oriented  Diagnosis:Adjustment disorder with mixed anxiety and depressed mood  Plan: Client Abilities/Strengths   Intelligent, caring, insightful  Client Treatment Preferences  Outpatient individual therapy  Client Statement of Needs  "I need help to deal with all of the stressors going on that are contributing to my depression"  Treatment Level  Outpatient Individual therapy  Symptoms  Decrease or loss of appetite.: No Description Entered (Status: maintained). Depressed or irritable  mood.: No Description Entered (Status: maintained). Lack of energy.: No Description Entered (Status:  maintained).  Problems Addressed  Unipolar Depression, Unipolar Depression  Goals 1. Develop healthy thinking patterns and beliefs about self, others, and the world that lead to the alleviation and help prevent the relapse of  depression. Objective Learn and implement behavioral strategies to overcome depression. Target Date: 12/12/2023 Frequency: biWeekly Progress: 20 Modality: individual  Related Interventions 1. Assist the client in developing skills that increase the likelihood of deriving pleasure from  behavioral activation (e.g., assertiveness skills, developing an exercise plan, less internal/more  external focus, increased social involvement); reinforce success. Objective Learn and implement problem-solving and decision-making skills. Target Date: 12/12/2023 Frequency: biWeekly Progress: 20 Modality: individual Objective Verbalize an understanding and resolution of current interpersonal problems. Target Date: 12/12/2023 Frequency: biWeekly Progress: 0 Modality: individual Related Interventions 1. For interpersonal disputes, help the client explore the relationship, the nature of the dispute,  whether it has reached an impasse, and available options to resolve it including learning and  implementing conflict-resolution skills; if the relationship has reached an impasse, consider  ways to change the impasse or to end the relationship. Objective Identify and replace thoughts and beliefs that support  depression. Target  Date: 12/12/2023 Frequency: biWeekly Progress: 20 Modality: individual Related Interventions 1. Facilitate and reinforce the client's shift from biased depressive self-talk and beliefs to realitybased cognitive messages that enhance self-confidence and increase adaptive actions (see  "Positive Self-Talk" in the Adult Psychotherapy Homework Planner by Stephannie Li). Objective Describe current and past experiences with depression including their impact on functioning and  attempts to resolve it. Target Date: 12/12/2023 Frequency: biWeekly Progress: 10 Modality: individual  Related Interventions 1. Encourage the client to share his/her thoughts and feelings of depression; express empathy and  build rapport while identifying primary cognitive, behavioral, interpersonal, or other  contributors to depression. 2. Recognize, accept, and cope with feelings of depression. Diagnosis F43.23  Adjustment Disorder with mixed anxiety and depression Conditions For Discharge Achievement of treatment goals and objectives  Telisa Ohlsen G Dylon Correa, LCSW

## 2023-02-19 ENCOUNTER — Ambulatory Visit (INDEPENDENT_AMBULATORY_CARE_PROVIDER_SITE_OTHER): Payer: No Typology Code available for payment source | Admitting: Internal Medicine

## 2023-02-19 ENCOUNTER — Encounter: Payer: Self-pay | Admitting: Internal Medicine

## 2023-02-19 VITALS — BP 110/70 | HR 92 | Temp 98.2°F | Ht 65.0 in | Wt 205.7 lb

## 2023-02-19 DIAGNOSIS — E1169 Type 2 diabetes mellitus with other specified complication: Secondary | ICD-10-CM | POA: Diagnosis not present

## 2023-02-19 DIAGNOSIS — E039 Hypothyroidism, unspecified: Secondary | ICD-10-CM

## 2023-02-19 DIAGNOSIS — E66811 Obesity, class 1: Secondary | ICD-10-CM

## 2023-02-19 DIAGNOSIS — Z0000 Encounter for general adult medical examination without abnormal findings: Secondary | ICD-10-CM

## 2023-02-19 DIAGNOSIS — E785 Hyperlipidemia, unspecified: Secondary | ICD-10-CM

## 2023-02-19 DIAGNOSIS — Z23 Encounter for immunization: Secondary | ICD-10-CM | POA: Diagnosis not present

## 2023-02-19 DIAGNOSIS — I10 Essential (primary) hypertension: Secondary | ICD-10-CM | POA: Diagnosis not present

## 2023-02-19 DIAGNOSIS — Z114 Encounter for screening for human immunodeficiency virus [HIV]: Secondary | ICD-10-CM

## 2023-02-19 DIAGNOSIS — Z1159 Encounter for screening for other viral diseases: Secondary | ICD-10-CM

## 2023-02-19 DIAGNOSIS — Z7984 Long term (current) use of oral hypoglycemic drugs: Secondary | ICD-10-CM

## 2023-02-19 DIAGNOSIS — E559 Vitamin D deficiency, unspecified: Secondary | ICD-10-CM

## 2023-02-19 DIAGNOSIS — R7401 Elevation of levels of liver transaminase levels: Secondary | ICD-10-CM

## 2023-02-19 DIAGNOSIS — N959 Unspecified menopausal and perimenopausal disorder: Secondary | ICD-10-CM

## 2023-02-19 LAB — COMPREHENSIVE METABOLIC PANEL
ALT: 56 U/L — ABNORMAL HIGH (ref 0–35)
AST: 41 U/L — ABNORMAL HIGH (ref 0–37)
Albumin: 4.5 g/dL (ref 3.5–5.2)
Alkaline Phosphatase: 80 U/L (ref 39–117)
BUN: 25 mg/dL — ABNORMAL HIGH (ref 6–23)
CO2: 22 meq/L (ref 19–32)
Calcium: 10.2 mg/dL (ref 8.4–10.5)
Chloride: 106 meq/L (ref 96–112)
Creatinine, Ser: 1.02 mg/dL (ref 0.40–1.20)
GFR: 61.84 mL/min (ref >60.00)
Glucose, Bld: 183 mg/dL — ABNORMAL HIGH (ref 70–99)
Potassium: 4.3 meq/L (ref 3.5–5.1)
Sodium: 137 meq/L (ref 135–145)
Total Bilirubin: 0.5 mg/dL (ref 0.2–1.2)
Total Protein: 7.3 g/dL (ref 6.0–8.3)

## 2023-02-19 MED ORDER — HYDROCHLOROTHIAZIDE 25 MG PO TABS: 25.0000 mg | ORAL_TABLET | Freq: Every day | ORAL | 1 refills | Status: DC

## 2023-02-19 MED ORDER — LISINOPRIL 20 MG PO TABS
20.0000 mg | ORAL_TABLET | Freq: Every day | ORAL | 1 refills | Status: DC
Start: 2023-02-19 — End: 2023-07-16

## 2023-02-19 MED ORDER — VENLAFAXINE HCL ER 37.5 MG PO CP24
ORAL_CAPSULE | ORAL | 1 refills | Status: DC
Start: 2023-02-19 — End: 2023-06-10

## 2023-02-19 NOTE — Progress Notes (Signed)
Established Patient Office Visit     CC/Reason for Visit: Annual preventive exam  HPI: Krista Dawson is a 58 y.o. female who is coming in today for the above mentioned reasons. Past Medical History is significant for: Hypertension, hyperlipidemia, type 2 diabetes, obesity, hypothyroidism, NASH, hypothyroidism.  She is followed by GI and endocrinology closely.  She is due for COVID, pneumonia and Tdap vaccines.   Past Medical/Surgical History: Past Medical History:  Diagnosis Date   Allergy    Asthma    exercise induced - not used inhaler couple of yrs pt reported 10-03-2018   Blood transfusion without reported diagnosis    Colon polyp    Complication of anesthesia    went into Vtach after breast reduction surgery 1988/89   Depression    DM (diabetes mellitus) (HCC)    GERD (gastroesophageal reflux disease)    HTN (hypertension)    Hyperlipidemia    Hypothyroidism    NASH (nonalcoholic steatohepatitis)    Obesity (BMI 30.0-34.9)    Post menopausal problems    Sleep apnea    no c-pap   Transaminitis    Vitamin D deficiency     Past Surgical History:  Procedure Laterality Date   ADRENALECTOMY Right    APPENDECTOMY     BIOPSY  12/12/2020   Procedure: BIOPSY;  Surgeon: Lemar Lofty., MD;  Location: [facility];  Service: Gastroenterology;;   BREAST REDUCTION SURGERY     CHOLECYSTECTOMY     ESOPHAGEAL MANOMETRY N/A 12/05/2022   Procedure: ESOPHAGEAL MANOMETRY (EM);  Surgeon: Beverley Fiedler, MD;  Location: [facility];  Service: Gastroenterology;  Laterality: N/A;   ESOPHAGOGASTRODUODENOSCOPY N/A 12/12/2020   Procedure: ESOPHAGOGASTRODUODENOSCOPY (EGD);  Surgeon: Lemar Lofty., MD;  Location: [facility];  Service: Gastroenterology;  Laterality: N/A;   EUS N/A 12/12/2020   Procedure: UPPER ENDOSCOPIC ULTRASOUND (EUS) LINEAR;  Surgeon: Lemar Lofty., MD;  Location: [facility];  Service: Gastroenterology;  Laterality: N/A;    HYSTERECTOMY ABDOMINAL WITH SALPINGECTOMY     LIVER BIOPSY  2008, 2013   NASH   TONSILLECTOMY     UPPER GASTROINTESTINAL ENDOSCOPY      Social History:  reports that she quit smoking about 16 years ago. Her smoking use included cigarettes. She has never used smokeless tobacco. She reports current alcohol use. She reports that she does not use drugs.  Allergies: Allergies  Allergen Reactions   Trulicity [Dulaglutide]     Pancreatitis    Spironolactone Hives and Swelling   Paxil [Paroxetine Hcl] Other (See Comments)    Other reaction(s): Other (comments) WORSENED DEPRESSION. WORSENED DEPRESSION.    Sulfa Antibiotics Hives and Itching   Codeine Itching and Rash    Family History:  Family History  Problem Relation Age of Onset   Breast cancer Mother    Colon polyps Mother    Stroke Mother 31   GER disease Father    Diabetes Father    Kidney disease Father    CAD Father    Other Father        cause of death listed as resp. failure   Colon polyps Sister    Stroke Sister    Diabetes Maternal Grandfather    Heart disease Maternal Grandfather    Diabetes Paternal Grandmother    CAD Paternal Grandfather    Pancreatic cancer Paternal Uncle    Pancreatic cancer Maternal Uncle    Colon cancer Neg Hx    Esophageal cancer Neg Hx    Stomach  cancer Neg Hx    Rectal cancer Neg Hx      Current Outpatient Medications:    aspirin EC 81 MG tablet, Take 81 mg by mouth every evening., Disp: , Rfl:    Calcium-Magnesium (CAL-MAG PO), Take by mouth., Disp: , Rfl:    Cholecalciferol (VITAMIN D3) 50 MCG (2000 UT) TABS, Take 2,000 Units by mouth in the morning., Disp: , Rfl:    Continuous Glucose Sensor (DEXCOM G7 SENSOR) MISC, Change sensors every 10 days DX E11.65, Disp: 9 each, Rfl: 3   dapagliflozin propanediol (FARXIGA) 10 MG TABS tablet, Take 1 tablet (10 mg total) by mouth daily., Disp: 90 tablet, Rfl: 1   glimepiride (AMARYL) 2 MG tablet, Take 1 tablet (2 mg total) by mouth daily  before breakfast., Disp: 90 tablet, Rfl: 3   glucose blood (CONTOUR NEXT TEST) test strip, Check blood sugar 1 time daily, Disp: 100 each, Rfl: 12   insulin glargine, 2 Unit Dial, (TOUJEO MAX SOLOSTAR) 300 UNIT/ML Solostar Pen, Inject 16 Units into the skin at bedtime., Disp: 15 mL, Rfl: 3   Insulin Pen Needle 32G X 4 MM MISC, 1 Device by Does not apply route daily in the afternoon., Disp: 100 each, Rfl: 4   magnesium chloride (SLOW-MAG) 64 MG TBEC SR tablet, Take 1 tablet by mouth daily., Disp: , Rfl:    Melatonin 10 MG CAPS, Take 10 mg by mouth at bedtime., Disp: , Rfl:    metFORMIN (GLUCOPHAGE-XR) 500 MG 24 hr tablet, Take 2 tablets (1,000 mg total) by mouth 2 (two) times daily with a meal., Disp: 360 tablet, Rfl: 3   Microlet Lancets MISC, by Does not apply route. Test 3-4 times daily (Micorlet Colored Lancets), Disp: , Rfl:    Omega-3 Fatty Acids (OMEGA 3 500 PO), Take 1,000 mg by mouth., Disp: , Rfl:    pantoprazole (PROTONIX) 40 MG tablet, Take 1 tablet (40 mg total) by mouth 2 (two) times daily before a meal., Disp: 180 tablet, Rfl: 3   REZDIFFRA 80 MG TABS, Take 1 tablet by mouth daily., Disp: , Rfl:    rosuvastatin (CRESTOR) 10 MG tablet, TAKE 1 TABLET DAILY, Disp: 90 tablet, Rfl: 1   SYNTHROID 88 MCG tablet, TAKE 1 TABLET DAILY BEFORE BREAKFAST, Disp: 90 tablet, Rfl: 0   topiramate (TOPAMAX) 50 MG tablet, Take 1 tablet (50 mg total) by mouth at bedtime., Disp: 90 tablet, Rfl: 3   triamcinolone cream (KENALOG) 0.1 %, Apply 1 application. topically 2 (two) times daily. (Patient taking differently: Apply 1 application  topically 2 (two) times daily. PRN), Disp: 30 g, Rfl: 0   hydrochlorothiazide (HYDRODIURIL) 25 MG tablet, Take 1 tablet (25 mg total) by mouth daily., Disp: 90 tablet, Rfl: 1   lisinopril (ZESTRIL) 20 MG tablet, Take 1 tablet (20 mg total) by mouth daily., Disp: 90 tablet, Rfl: 1   venlafaxine XR (EFFEXOR-XR) 37.5 MG 24 hr capsule, TAKE 1 CAPSULE DAILY, Disp: 90 capsule, Rfl:  1  Review of Systems:  Negative unless indicated in HPI.   Physical Exam: Vitals:   02/19/23 0935  BP: 110/70  Pulse: 92  Temp: 98.2 F (36.8 C)  TempSrc: Oral  SpO2: 98%  Weight: 205 lb 11.2 oz (93.3 kg)  Height: 5\' 5"  (1.651 m)    Body mass index is 34.23 kg/m.   Physical Exam Vitals reviewed.  Constitutional:      General: She is not in acute distress.    Appearance: Normal appearance. She is obese. She  is not ill-appearing, toxic-appearing or diaphoretic.  HENT:     Head: Normocephalic.     Right Ear: Tympanic membrane, ear canal and external ear normal. There is no impacted cerumen.     Left Ear: Tympanic membrane, ear canal and external ear normal. There is no impacted cerumen.     Nose: Nose normal.     Mouth/Throat:     Mouth: Mucous membranes are moist.     Pharynx: Oropharynx is clear. No oropharyngeal exudate or posterior oropharyngeal erythema.  Eyes:     General: No scleral icterus.       Right eye: No discharge.        Left eye: No discharge.     Conjunctiva/sclera: Conjunctivae normal.     Pupils: Pupils are equal, round, and reactive to light.  Neck:     Vascular: No carotid bruit.  Cardiovascular:     Rate and Rhythm: Normal rate and regular rhythm.     Pulses: Normal pulses.     Heart sounds: Normal heart sounds.  Pulmonary:     Effort: Pulmonary effort is normal. No respiratory distress.     Breath sounds: Normal breath sounds.  Abdominal:     General: Abdomen is flat. Bowel sounds are normal.     Palpations: Abdomen is soft.  Musculoskeletal:        General: Normal range of motion.     Cervical back: Normal range of motion.  Skin:    General: Skin is warm and dry.  Neurological:     General: No focal deficit present.     Mental Status: She is alert and oriented to person, place, and time. Mental status is at baseline.  Psychiatric:        Mood and Affect: Mood normal.        Behavior: Behavior normal.        Thought Content: Thought  content normal.        Judgment: Judgment normal.     Impression and Plan:  Encounter for preventive health examination  Type 2 diabetes mellitus with other specified complication, without long-term current use of insulin (HCC)  Primary hypertension -     hydroCHLOROthiazide; Take 1 tablet (25 mg total) by mouth daily.  Dispense: 90 tablet; Refill: 1 -     Lisinopril; Take 1 tablet (20 mg total) by mouth daily.  Dispense: 90 tablet; Refill: 1  Hyperlipidemia, unspecified hyperlipidemia type  Hypothyroidism, unspecified type  Obesity (BMI 30.0-34.9)  Vitamin D deficiency  Encounter for hepatitis C screening test for low risk patient -     Hepatitis C antibody; Future  Encounter for screening for HIV -     HIV Antibody (routine testing w rflx); Future  Immunization due  Transaminitis -     Comprehensive metabolic panel; Future  Post menopausal problems -     Venlafaxine HCl ER; TAKE 1 CAPSULE DAILY  Dispense: 90 capsule; Refill: 1   -Recommend routine eye and dental care. -Healthy lifestyle discussed in detail. -Labs to be updated today. -Prostate cancer screening: N/A Health Maintenance  Topic Date Due   HIV Screening  Never done   Hepatitis C Screening  Never done   Pap with HPV screening  Never done   DTaP/Tdap/Td vaccine (4 - Td or Tdap) 10/23/2020   Pneumococcal Vaccination (2 of 2 - PCV) 02/28/2021   COVID-19 Vaccine (4 - 2024-25 season) 09/09/2022   Hemoglobin A1C  07/29/2023   Yearly kidney function blood test for diabetes  09/18/2023   Yearly kidney health urinalysis for diabetes  09/18/2023   Complete foot exam   09/18/2023   Colon Cancer Screening  10/03/2023   Eye exam for diabetics  12/03/2023   Mammogram  12/02/2024   Flu Shot  Completed   Zoster (Shingles) Vaccine  Completed   HPV Vaccine  Aged Out     -Tdap and PCV 20 in office today.  Will consider COVID at a later date. -Obtain records from GYN. -She will be due for 5-year colonoscopy in  September 2025.    Chaya Jan, MD [facility]

## 2023-02-19 NOTE — Addendum Note (Signed)
Addended by: Kern Reap B on: 02/19/2023 10:57 AM   Modules accepted: Orders

## 2023-02-20 LAB — HEPATITIS C ANTIBODY: Hepatitis C Ab: NONREACTIVE

## 2023-02-20 LAB — HIV ANTIBODY (ROUTINE TESTING W REFLEX): HIV 1&2 Ab, 4th Generation: NONREACTIVE

## 2023-02-21 ENCOUNTER — Encounter: Payer: Self-pay | Admitting: Internal Medicine

## 2023-02-21 ENCOUNTER — Ambulatory Visit: Payer: No Typology Code available for payment source | Admitting: Psychology

## 2023-02-21 DIAGNOSIS — F4323 Adjustment disorder with mixed anxiety and depressed mood: Secondary | ICD-10-CM

## 2023-02-21 LAB — MICROALBUMIN / CREATININE URINE RATIO
Creatinine,U: 48.3 mg/dL
Microalb Creat Ratio: 19.9 mg/g (ref 0.0–30.0)
Microalb, Ur: 1 mg/dL (ref 0.0–1.9)

## 2023-02-22 NOTE — Progress Notes (Addendum)
 [facility] Counselor/Therapist Progress Note  Patient ID: Krista Dawson, MRN: [id number],    Date:02/21/2023  Time Spent: 53 minutes  Time In:  12:07  Time Out:  1:00  Treatment Type: Individual Therapy  Reported Symptoms: sadness,anxiety  Mental Status Exam: Appearance:  Casual     Behavior: Appropriate  Motor: Normal  Speech/Language:  Clear and Coherent  Affect: Blunt  Mood: normal  Thought process: normal  Thought content:   WNL  Sensory/Perceptual disturbances:   WNL  Orientation: oriented to person, place, time/date, and situation  Attention: Good  Concentration: Good  Memory: WNL  Fund of knowledge:  Good  Insight:   Good  Judgment:  Good  Impulse Control: Good   Risk Assessment: Danger to Self:  No Self-injurious Behavior: No Danger to Others: No Duty to Warn:no Physical Aggression / Violence:No  Access to Firearms a concern: No  Gang Involvement:No   Subjective: The patient attended a face-to-face individual therapy session in the office today.  The patient presents as pleasant and cooperative.  Patient reports that she feels like therapy is very helpful to her and has been since I have started seeing her.  She states that she feels like she has more clarity about what she needs to do and she reports that she has had having peaceful feelings about what is going to happen next as opposed to being more anxious as she was before.  We talked about what she has going on at work and we talked about change and how to change happens.  She feels like something is going to happen at work and we talked about her exploring some options so that when the time comes she can make a good decision about what she wants to do next.  The patient did talk about having thoughts of really wanting to help in [place] at some point.  She is concerned that she might get laid off from her job and we talked about her just waiting and seeing what happens but in  the meantime looking at options that she thinks might be helpful.  We will continue to work with the patient on managing her stress and exploring options.  Today we used cognitive behavioral therapy and problem solving. Interventions: Cognitive Behavioral Therapy, Assertiveness/Communication, and Insight-Oriented  Diagnosis:Adjustment disorder with mixed anxiety and depressed mood  Plan: Client Abilities/Strengths  Intelligent, caring, insightful  Client Treatment Preferences  Outpatient individual therapy  Client Statement of Needs  "I need help to deal with all of the stressors going on that are contributing to my depression"  Treatment Level  Outpatient Individual therapy  Symptoms  Decrease or loss of appetite.: No Description Entered (Status: maintained). Depressed or irritable  mood.: No Description Entered (Status: maintained). Lack of energy.: No Description Entered (Status:  maintained).  Problems Addressed  Unipolar Depression, Unipolar Depression  Goals 1. Develop healthy thinking patterns and beliefs about self, others, and the world that lead to the alleviation and help prevent the relapse of  depression. Objective Learn and implement behavioral strategies to overcome depression. Target Date: 12/12/2023 Frequency: biWeekly Progress: 30 Modality: individual  Related Interventions 1. Assist the client in developing skills that increase the likelihood of deriving pleasure from  behavioral activation (e.g., assertiveness skills, developing an exercise plan, less internal/more  external focus, increased social involvement); reinforce success. Objective Learn and implement problem-solving and decision-making skills. Target Date: 12/12/2023 Frequency: biWeekly Progress: 20 Modality: individual Objective Verbalize an understanding and resolution of current interpersonal  problems. Target Date: 12/12/2023 Frequency: biWeekly Progress: 0 Modality: individual Related  Interventions 1. For interpersonal disputes, help the client explore the relationship, the nature of the dispute,  whether it has reached an impasse, and available options to resolve it including learning and  implementing conflict-resolution skills; if the relationship has reached an impasse, consider  ways to change the impasse or to end the relationship. Objective Identify and replace thoughts and beliefs that support depression. Target Date: 12/12/2023 Frequency: biWeekly Progress: 20 Modality: individual Related Interventions 1. Facilitate and reinforce the client's shift from biased depressive self-talk and beliefs to realitybased cognitive messages that enhance self-confidence and increase adaptive actions (see  "Positive Self-Talk" in the Adult Psychotherapy Homework Planner by Stephannie Li). Objective Describe current and past experiences with depression including their impact on functioning and  attempts to resolve it. Target Date: 12/12/2023 Frequency: biWeekly Progress: 10 Modality: individual  Related Interventions 1. Encourage the client to share his/her thoughts and feelings of depression; express empathy and  build rapport while identifying primary cognitive, behavioral, interpersonal, or other  contributors to depression. 2. Recognize, accept, and cope with feelings of depression. Diagnosis F43.23  Adjustment Disorder with mixed anxiety and depression Conditions For Discharge Achievement of treatment goals and objectives  Aitana Burry G Myana Schlup, LCSW

## 2023-02-28 ENCOUNTER — Encounter: Payer: Self-pay | Admitting: Internal Medicine

## 2023-03-04 ENCOUNTER — Other Ambulatory Visit: Payer: Self-pay | Admitting: Internal Medicine

## 2023-03-04 DIAGNOSIS — E039 Hypothyroidism, unspecified: Secondary | ICD-10-CM

## 2023-03-05 ENCOUNTER — Other Ambulatory Visit: Payer: Self-pay

## 2023-03-05 MED ORDER — DEXCOM G7 SENSOR MISC: 3 refills | Status: DC

## 2023-03-07 ENCOUNTER — Ambulatory Visit: Payer: No Typology Code available for payment source | Admitting: Psychology

## 2023-03-10 ENCOUNTER — Encounter: Payer: Self-pay | Admitting: Internal Medicine

## 2023-03-11 ENCOUNTER — Encounter: Payer: Self-pay | Admitting: Nurse Practitioner

## 2023-03-11 ENCOUNTER — Ambulatory Visit: Payer: No Typology Code available for payment source | Admitting: Nurse Practitioner

## 2023-03-11 ENCOUNTER — Other Ambulatory Visit: Payer: Self-pay

## 2023-03-11 VITALS — BP 118/68 | HR 78 | Ht 65.5 in | Wt 208.2 lb

## 2023-03-11 DIAGNOSIS — Z860100 Personal history of colon polyps, unspecified: Secondary | ICD-10-CM | POA: Diagnosis not present

## 2023-03-11 DIAGNOSIS — K219 Gastro-esophageal reflux disease without esophagitis: Secondary | ICD-10-CM | POA: Diagnosis not present

## 2023-03-11 DIAGNOSIS — R1310 Dysphagia, unspecified: Secondary | ICD-10-CM

## 2023-03-11 MED ORDER — DEXCOM G7 SENSOR MISC: 3 refills | Status: DC

## 2023-03-11 NOTE — Patient Instructions (Signed)
 Colonoscopy due 09/2023  Continue Pantoprazole 40 mg twice daily.  Please have your primary care doctor check your vitamin D level with next blood draw.  Due to recent changes in healthcare laws, you may see the results of your imaging and laboratory studies on MyChart before your provider has had a chance to review them.  We understand that in some cases there may be results that are confusing or concerning to you. Not all laboratory results come back in the same time frame and the provider may be waiting for multiple results in order to interpret others.  Please give [place] 48 hours in order for your provider to thoroughly review all the results before contacting the office for clarification of your results.   Thank you for trusting me with your gastrointestinal care!   Alcide Evener, CRNP

## 2023-03-11 NOTE — Progress Notes (Signed)
 Addendum: Reviewed and agree with assessment and management plan. Asha Grumbine, Carie Caddy, MD

## 2023-03-11 NOTE — Progress Notes (Signed)
 03/11/2023 Krista Dawson [id number] [birth year]   Chief Complaint: Follow up after completing esophageal manometry   History of Present Illness: Krista Dawson is a 58 year old female with a past medical history of metabolic syndrome X with obesity, diabetes mellitus type II, hypertension, dyslipidemia, MASLD (followed by [facility] hepatology), pancreatitis 07/2020 and a hyperplastic colon polyp. Past Cholecystectomy, total laparoscopic hysterectomy bilateral salpingoopherectomy 11/2016 and appendectomy 2002.   I last saw Krista Dawson 09/03/2022 for her annual GERD follow-up and to refill pantoprazole 40 mg daily prescription.  At that time, she denied having any heartburn but endorsed feeling gagged when she swallows liquids or when eating solid foods, she described feeling like food or liquids briefly got stuck in her throat which occurred 4 times monthly for the past few years.  No specific food triggers.  Prior EGD 12/12/2020 identified a 2 cm hiatal hernia and gastritis.  A barium swallow study was completed 09/25/2022 which identified mild to moderate esophageal dysmotility otherwise was unremarkable.  She subsequently underwent an esophageal manometry 12/05/2022 which showed normal relaxation of the EG junction with premature spastic esophageal contractions suggestive of distal esophageal spasms and pantoprazole was increased to 40 mg twice daily.  She presents today for further follow-up.  She denies having any heartburn.  She stated her swallowing difficulties nearly abated after increasing Pantoprazole 40 mg twice daily.  She has rare episodes when food gets briefly hung up in her throat which passes after she waits a few seconds or swallows water.  Overall, her swallowing symptoms have significantly improved.  She is having normal bowel movements.  No bloody or black stools. Her most recent colonoscopy was 10/03/2018, one hyperplastic polyp was removed from the ascending  colon.  She is due for a colonoscopy 09/2023.  She has a history of MASLD followed by [facility] hepatology, started on Rezdiffra (Resmetirom) October 2024.  Labs 02/19/2023 showed a total bilirubin level of 0.5.  Alk phos 80.  AST 41.  ALT 56.       Latest Ref Rng & Units 09/18/2022   10:07 AM 12/23/2020    9:49 AM 07/19/2020   11:56 AM  CBC  WBC 4.0 - 10.5 K/uL 7.9  7.9  8.3   Hemoglobin 12.0 - 15.0 g/dL 40.9  81.1  91.4   Hematocrit 36.0 - 46.0 % 41.6  40.3  41.5   Platelets 150.0 - 400.0 K/uL 289.0  273.0  325.0        Latest Ref Rng & Units 02/19/2023   10:32 AM 09/18/2022   10:07 AM 08/25/2021   10:24 AM  CMP  Glucose 70 - 99 mg/dL 782  956    BUN 6 - 23 mg/dL 25  25    Creatinine 2.13 - 1.20 mg/dL 0.86  5.78  4.69   Sodium 135 - 145 mEq/L 137  139    Potassium 3.5 - 5.1 mEq/L 4.3  4.3    Chloride 96 - 112 mEq/L 106  107    CO2 19 - 32 mEq/L 22  22    Calcium 8.4 - 10.5 mg/dL 62.9  52.8    Total Protein 6.0 - 8.3 g/dL 7.3     Total Bilirubin 0.2 - 1.2 mg/dL 0.5     Alkaline Phos 39 - 117 U/L 80     AST 0 - 37 U/L 41     ALT 0 - 35 U/L 56        Barium swallow study 09/25/2022:  FINDINGS: Swallowing: Appears normal. No vestibular penetration or aspiration seen.   Pharynx: Unremarkable.   Esophagus: Normal appearance.   Esophageal motility: Mild-to-moderate dysmotility with splitting and stasis contrast bolus visualized.   Hiatal Hernia: None.   Gastroesophageal reflux: None visualized.   Ingested 13mm barium tablet: Delayed passage of the tablet through the GE junction likely due to dysmotility, without signs of stricture.   Other: None.   IMPRESSION: Mild-to-moderate esophageal dysmotility. Otherwise unremarkable esophagram.  PAST GI PROCEDURES  Esophageal manometry 12/05/2022:   EGD/Endoscopic Ultrasound by Dr. Meridee Score 12/12/2020:  EGD Impression:  - No gross lesions in esophagus.Z-line irregular, 35 cm from the incisors.  - 2 cm hiatal hernia.  -  Gastritis. Biopsied. -  No gross lesions in the duodenal bulb, in the first portion of the duodenum and in the second portion of the duodenum.  - Normal major papilla.  EUS Impression:  - Pancreatic parenchymal abnormalities consisting of lobularity and hyperechoic strands were noted in the entire pancreas. There was no sign of significant pathology ducts of the pancreas. These findings do not constitute (2 Minor criteria) diagnosis of Chronic Pancreatitis.  - There was no sign of significant pathology in the common bile duct and in the common hepatic duct.  - There was no sign of significant pathology in the ampulla.  - One benign lymph node was visualized in the porta hepatis region. Tissue has not been obtained. However, the endosonographic appearance is consistent with benign inflammatory changes.   EGD 03/13/2019 by Dr. Orvan Falconer: - Mucosal nodule found in the esophagus. Biopsied.  - Normal esophagus. Biopsied.  - A medium amount of food (residue) in the stomach.  - Normal stomach. Biopsied.  - Retained food in the duodenum.  1. Surgical [P], duodenum - DUODENAL MUCOSA WITH NO SIGNIFICANT PATHOLOGIC FINDINGS. - NEGATIVE FOR INCREASED INTRAEPITHELIAL LYMPHOCYTES AND VILLOUS ARCHITECTURAL CHANGES. 2. Surgical [P], random sites gastric - GASTRIC ANTRAL MUCOSA WITH MILD REACTIVE GASTROPATHY. - GASTRIC OXYNTIC MUCOSA WITH MILD CHRONIC GASTRITIS. - WARTHIN-STARRY STAIN IS NEGATIVE FOR HELICOBACTER PYLORI. 3. Surgical [P], distal esophagus - GASTROESOPHAGEAL JUNCTION MUCOSA WITH REACTIVE/REGENERATIVE CHANGES. - NEGATIVE FOR INTESTINAL METAPLASIA (GOBLET CELL METAPLASIA). 4. Surgical [P], proximal esophagus - SQUAMOUS ESOPHAGEAL EPITHELIUM WITH NO SIGNIFICANT PATHOLOGIC FINDINGS. - NEGATIVE FOR INCREASED INTRAEPITHELIAL EOSINOPHILS. 5. Surgical [P], esophageal nodule - SQUAMOUS PAPILLOMA. - NO SUBMUCOSA PRESENT FOR EVALUATION.   Gastric empty study 03/27/2019: Normal   Colonoscopy  10/03/2018: - One 2 mm polyp in the ascending colon, removed with a cold snare. Resected and retrieved.  - The examination was otherwise normal on direct and retroflexion views. 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     Calcium-Magnesium (CAL-MAG PO) Take by mouth.     Cholecalciferol (VITAMIN D3) 50 MCG (2000 UT) TABS Take 2,000 Units by mouth in the morning.     dapagliflozin propanediol (FARXIGA) 10 MG TABS tablet Take 1 tablet (10 mg total) by mouth daily.  90 tablet 1   glimepiride (AMARYL) 2 MG tablet Take 1 tablet (2 mg total) by mouth daily before breakfast. 90 tablet 3   glucose blood (CONTOUR NEXT TEST) test strip Check blood sugar 1 time daily 100 each 12   hydrochlorothiazide (HYDRODIURIL) 25 MG tablet Take 1 tablet (25 mg total) by mouth daily. 90 tablet 1   insulin glargine, 2 Unit Dial, (TOUJEO MAX SOLOSTAR) 300 UNIT/ML Solostar Pen Inject 16 Units into the skin at bedtime. 15 mL 3   Insulin Pen Needle 32G X 4 MM MISC 1 Device by Does not apply route daily in the afternoon. 100 each 4   levothyroxine (SYNTHROID) 88 MCG tablet TAKE 1 TABLET DAILY BEFORE BREAKFAST 90 tablet 1   lisinopril (ZESTRIL) 20 MG tablet Take 1 tablet (20 mg total) by mouth daily. 90 tablet 1   magnesium chloride (SLOW-MAG) 64 MG TBEC SR tablet Take 1 tablet by mouth daily.     Melatonin 10 MG CAPS Take 10 mg by mouth at  bedtime.     metFORMIN (GLUCOPHAGE-XR) 500 MG 24 hr tablet Take 2 tablets (1,000 mg total) by mouth 2 (two) times daily with a meal. 360 tablet 3   Microlet Lancets MISC by Does not apply route. Test 3-4 times daily (Micorlet Colored Lancets)     Omega-3 Fatty Acids (OMEGA 3 500 PO) Take 1,000 mg by mouth.     pantoprazole (PROTONIX) 40 MG tablet Take 1 tablet (40 mg total) by mouth 2 (two) times daily before a meal. 180 tablet 3   REZDIFFRA 80 MG TABS Take 1 tablet by mouth daily.     rosuvastatin (CRESTOR) 10 MG tablet TAKE 1 TABLET DAILY 90 tablet 1   topiramate (TOPAMAX) 50 MG tablet Take 1 tablet (50 mg total) by mouth at bedtime. 90 tablet 3   triamcinolone cream (KENALOG) 0.1 % Apply 1 application. topically 2 (two) times daily. (Patient taking differently: Apply 1 application  topically 2 (two) times daily. PRN) 30 g 0   venlafaxine XR (EFFEXOR-XR) 37.5 MG 24 hr capsule TAKE 1 CAPSULE DAILY 90 capsule 1   No current facility-administered medications on file prior to visit.   Allergies  Allergen Reactions   Trulicity [Dulaglutide]     Pancreatitis    Spironolactone Hives and Swelling   Paxil [Paroxetine Hcl] Other (See Comments)    Other reaction(s): Other (comments) WORSENED DEPRESSION. WORSENED DEPRESSION.    Sulfa Antibiotics Hives and Itching   Codeine Itching and Rash   Current Medications, Allergies, Past Medical History, Past Surgical History, Family History and Social History were reviewed in [employer] record.  Review of Systems:   Constitutional: Negative for fever, sweats, chills or weight loss.  Respiratory: Negative for shortness of breath.   Cardiovascular: Negative for chest pain, palpitations and leg swelling.  Gastrointestinal: See HPI.  Musculoskeletal: Negative for back pain or muscle aches.  Neurological: Negative for dizziness, headaches or paresthesias.   Physical Exam: Ht 5' 5.5" (1.664 m)   Wt 208 lb 3.2 oz (94.4 kg)   BMI  34.12 kg/m  General: 58 year old female in no acute distress. Head: Normocephalic and atraumatic. Eyes: No scleral icterus. Conjunctiva pink . Ears: Normal auditory acuity. Mouth: Dentition intact. No ulcers or lesions.  Lungs: Clear throughout to auscultation. Heart: Regular rate and rhythm, no murmur. Abdomen: Soft, nontender and nondistended. No masses or hepatomegaly. Normal bowel sounds x 4 quadrants.  Rectal: Deferred.  Musculoskeletal: Symmetrical with no gross deformities. Extremities: No  edema. Neurological: Alert oriented x 4. No focal deficits.  Psychological: Alert and cooperative. Normal mood and affect  Assessment and Recommendations:  58 year old female with GERD and recurrent dysphagia. EGD 2021 and 2022 without evidence of esophagitis or esophagea stenosis.  Barium swallow study 09/25/2022 identified mild to moderate esophageal dysmotility otherwise was unremarkable.  Esophageal manometry 12/05/2022 showed normal relaxation of the EG junction with premature spastic esophageal contractions suggestive of distal esophageal spasms.  Dysphagia symptoms nearly abated after increasing Pantoprazole to 40 mg twice daily. -Continue Pantoprazole 40 mg p.o. twice daily -Recommend vitamin D level with next routine labs with PCP -Continue GERD diet  History of colon polyps.Colonoscopy 10/03/2018 identified one hyperplastic polyp removed from the ascending colon.  Mother and sister with history of colon polyps. -Recall colonoscopy due 09/2023  History of MASLD followed by [facility] hepatology, started on Rezdiffra (Resmetirom) October 2024.  Labs 02/19/2023 showed a total bilirubin level of 0.5.  Alk phos 80.  AST 41.  ALT 56.  - Follow up with [facility]atology due 04/2023, to include labs and Fibroscan

## 2023-03-20 ENCOUNTER — Ambulatory Visit (INDEPENDENT_AMBULATORY_CARE_PROVIDER_SITE_OTHER): Admitting: Psychology

## 2023-03-20 ENCOUNTER — Encounter: Payer: Self-pay | Admitting: Internal Medicine

## 2023-03-20 DIAGNOSIS — F4323 Adjustment disorder with mixed anxiety and depressed mood: Secondary | ICD-10-CM | POA: Diagnosis not present

## 2023-03-21 ENCOUNTER — Ambulatory Visit: Payer: No Typology Code available for payment source | Admitting: Psychology

## 2023-03-21 NOTE — Progress Notes (Signed)
 [facility] Counselor/Therapist Progress Note  Patient ID: Krista Dawson, MRN: [id number],    Date:03/20/2023  Time Spent: 60 minutes  Time In:  4:00  Time Out:  5:00  Treatment Type: Individual Therapy  Reported Symptoms: sadness,anxiety  Mental Status Exam: Appearance:  Casual     Behavior: Appropriate  Motor: Normal  Speech/Language:  Clear and Coherent  Affect: Blunt  Mood: sad  Thought process: normal  Thought content:   WNL  Sensory/Perceptual disturbances:   WNL  Orientation: oriented to person, place, time/date, and situation  Attention: Good  Concentration: Good  Memory: WNL  Fund of knowledge:  Good  Insight:   Good  Judgment:  Good  Impulse Control: Good   Risk Assessment: Danger to Self:  No Self-injurious Behavior: No Danger to Others: No Duty to Warn:no Physical Aggression / Violence:No  Access to Firearms a concern: No  Gang Involvement:No   Subjective: The patient attended a face-to-face individual therapy session in the office today.  The patient was tearful and depressed today.  She reports that she is not doing as well as she was.  And she does not present as well.  She states that she is feeling frustrated at work and it seems like there is chaos there and she comes home and she is very tired and does not do much when she gets home for herself.  We talked about the need for her to be able to do more things for herself and take better care of her physical body.  As we were talking about the situation we talked about her labs being off and it seems that it is possible that because she does not feel physically well it is affecting her emotional health.  I explained to her that if she is not feeling well she is likely to not be handling things quite as well.  We talked about the need for her to eat better, and to exercise if she gets an opportunity.  In addition we talked about her doing some things for herself and the possibility that she  might need a vacation.  The patient was going to contact her PCP and see if she is anemic or if there is some problems with her physical health that may be causing her to not do as well from an emotional perspective.  I also encouraged her to take consider taking a vacation so that she can relax some and just do what she wants to do instead of taking care of several people. Interventions: Cognitive Behavioral Therapy, Assertiveness/Communication, and Insight-Oriented  Diagnosis:Adjustment disorder with mixed anxiety and depressed mood  Plan: Client Abilities/Strengths  Intelligent, caring, insightful  Client Treatment Preferences  Outpatient individual therapy  Client Statement of Needs  "I need help to deal with all of the stressors going on that are contributing to my depression"  Treatment Level  Outpatient Individual therapy  Symptoms  Decrease or loss of appetite.: No Description Entered (Status: maintained). Depressed or irritable  mood.: No Description Entered (Status: maintained). Lack of energy.: No Description Entered (Status:  maintained).  Problems Addressed  Unipolar Depression, Unipolar Depression  Goals 1. Develop healthy thinking patterns and beliefs about self, others, and the world that lead to the alleviation and help prevent the relapse of  depression. Objective Learn and implement behavioral strategies to overcome depression. Target Date: 12/12/2023 Frequency: biWeekly Progress: 30 Modality: individual  Related Interventions 1. Assist the client in developing skills that increase the likelihood of deriving pleasure  from  behavioral activation (e.g., assertiveness skills, developing an exercise plan, less internal/more  external focus, increased social involvement); reinforce success. Objective Learn and implement problem-solving and decision-making skills. Target Date: 12/12/2023 Frequency: biWeekly Progress: 20 Modality: individual Objective Verbalize an  understanding and resolution of current interpersonal problems. Target Date: 12/12/2023 Frequency: biWeekly Progress: 0 Modality: individual Related Interventions 1. For interpersonal disputes, help the client explore the relationship, the nature of the dispute,  whether it has reached an impasse, and available options to resolve it including learning and  implementing conflict-resolution skills; if the relationship has reached an impasse, consider  ways to change the impasse or to end the relationship. Objective Identify and replace thoughts and beliefs that support depression. Target Date: 12/12/2023 Frequency: biWeekly Progress: 20 Modality: individual Related Interventions 1. Facilitate and reinforce the client's shift from biased depressive self-talk and beliefs to realitybased cognitive messages that enhance self-confidence and increase adaptive actions (see  "Positive Self-Talk" in the Adult Psychotherapy Homework Planner by Stephannie Li). Objective Describe current and past experiences with depression including their impact on functioning and  attempts to resolve it. Target Date: 12/12/2023 Frequency: biWeekly Progress: 10 Modality: individual  Related Interventions 1. Encourage the client to share his/her thoughts and feelings of depression; express empathy and  build rapport while identifying primary cognitive, behavioral, interpersonal, or other  contributors to depression. 2. Recognize, accept, and cope with feelings of depression. Diagnosis F43.23  Adjustment Disorder with mixed anxiety and depression Conditions For Discharge Achievement of treatment goals and objectives  Anhad Sheeley G Torrence Hammack, LCSW

## 2023-04-05 ENCOUNTER — Ambulatory Visit: Payer: No Typology Code available for payment source | Admitting: Psychology

## 2023-04-05 DIAGNOSIS — F4323 Adjustment disorder with mixed anxiety and depressed mood: Secondary | ICD-10-CM | POA: Diagnosis not present

## 2023-04-05 DIAGNOSIS — F330 Major depressive disorder, recurrent, mild: Secondary | ICD-10-CM | POA: Diagnosis not present

## 2023-04-05 NOTE — Progress Notes (Addendum)
 [facility] Counselor/Therapist Progress Note  Patient ID: Krista Dawson, MRN: [id number],    Date:04/05/2023  Time Spent: 60 minutes  Time In:  11:00  Time Out:  12:00  Treatment Type: Individual Therapy  Reported Symptoms: sadness,anxiety  Mental Status Exam: Appearance:  Casual     Behavior: Appropriate  Motor: Normal  Speech/Language:  Clear and Coherent  Affect: Blunt  Mood: sad  Thought process: normal  Thought content:   WNL  Sensory/Perceptual disturbances:   WNL  Orientation: oriented to person, place, time/date, and situation  Attention: Good  Concentration: Good  Memory: WNL  Fund of knowledge:  Good  Insight:   Good  Judgment:  Good  Impulse Control: Good   Risk Assessment: Danger to Self:  No Self-injurious Behavior: No Danger to Others: No Duty to Warn:no Physical Aggression / Violence:No  Access to Firearms a concern: No  Gang Involvement:No   Subjective: The patient attended a face-to-face individual therapy session in the office today.  The patient presents as tearful and depressed.  The patient reports that she is really struggling at work and has had a lot of things happen over the last week that has just solidified that she is not sure that she wants to be in that toxic environment.  We talked about the need for her to start focusing more on detaching from trying to fix things in the environment so that she can stay there and do what she needs to do until she can find something else.  I encouraged her to focus her energy on looking into what other options might be available for her as opposed to lamenting and struggling with whether she can fix something at her current job.  I talked to her about how to do task and how to just do what is required so that she can focus on taking care of her own needs.  It does sound like a toxic work environment and it does not sound like it is going to change a whole lot.  The patient felt better when she  left and feels like she is going to look at either trying to find a way to transfer in her own company or see what else might be available outside of her company.  The patient also may need some additional medication on board to help her deal with this and I referred her to [facility] and recommended that she go ahead and call and see if she can get on someone schedule as soon as possible and get on the cancellation list.  Interventions: Cognitive Behavioral Therapy, Assertiveness/Communication, and Insight-Oriented  Diagnosis:Adjustment disorder with mixed anxiety and depressed mood  Major depressive disorder, recurrent episode, mild (HCC)  Plan: Client Abilities/Strengths  Intelligent, caring, insightful  Client Treatment Preferences  Outpatient individual therapy  Client Statement of Needs  "I need help to deal with all of the stressors going on that are contributing to my depression"  Treatment Level  Outpatient Individual therapy  Symptoms  Decrease or loss of appetite.: No Description Entered (Status: maintained). Depressed or irritable  mood.: No Description Entered (Status: maintained). Lack of energy.: No Description Entered (Status:  maintained).  Problems Addressed  Unipolar Depression, Unipolar Depression  Goals 1. Develop healthy thinking patterns and beliefs about self, others, and the world that lead to the alleviation and help prevent the relapse of  depression. Objective Learn and implement behavioral strategies to overcome depression. Target Date: 12/12/2023 Frequency: biWeekly Progress: 30 Modality: individual  Related Interventions 1. Assist the client in developing skills that increase the likelihood of deriving pleasure from  behavioral activation (e.g., assertiveness skills, developing an exercise plan, less internal/more  external focus, increased social involvement); reinforce success. Objective Learn and implement problem-solving and  decision-making skills. Target Date: 12/12/2023 Frequency: biWeekly Progress: 20 Modality: individual Objective Verbalize an understanding and resolution of current interpersonal problems. Target Date: 12/12/2023 Frequency: biWeekly Progress: 0 Modality: individual Related Interventions 1. For interpersonal disputes, help the client explore the relationship, the nature of the dispute,  whether it has reached an impasse, and available options to resolve it including learning and  implementing conflict-resolution skills; if the relationship has reached an impasse, consider  ways to change the impasse or to end the relationship. Objective Identify and replace thoughts and beliefs that support depression. Target Date: 12/12/2023 Frequency: biWeekly Progress: 20 Modality: individual Related Interventions 1. Facilitate and reinforce the client's shift from biased depressive self-talk and beliefs to realitybased cognitive messages that enhance self-confidence and increase adaptive actions (see  "Positive Self-Talk" in the Adult Psychotherapy Homework Planner by Stephannie Li). Objective Describe current and past experiences with depression including their impact on functioning and  attempts to resolve it. Target Date: 12/12/2023 Frequency: biWeekly Progress: 10 Modality: individual  Related Interventions 1. Encourage the client to share his/her thoughts and feelings of depression; express empathy and  build rapport while identifying primary cognitive, behavioral, interpersonal, or other  contributors to depression. 2. Recognize, accept, and cope with feelings of depression. Diagnosis F43.23  Adjustment Disorder with mixed anxiety and depression Conditions For Discharge Achievement of treatment goals and objectives  Krista Dawson G Krista Coles, LCSW

## 2023-05-06 ENCOUNTER — Ambulatory Visit (INDEPENDENT_AMBULATORY_CARE_PROVIDER_SITE_OTHER): Payer: No Typology Code available for payment source | Admitting: Internal Medicine

## 2023-05-06 ENCOUNTER — Encounter: Payer: Self-pay | Admitting: Internal Medicine

## 2023-05-06 VITALS — BP 120/74 | HR 87 | Ht 65.5 in | Wt 209.0 lb

## 2023-05-06 DIAGNOSIS — Z7984 Long term (current) use of oral hypoglycemic drugs: Secondary | ICD-10-CM

## 2023-05-06 DIAGNOSIS — Z794 Long term (current) use of insulin: Secondary | ICD-10-CM

## 2023-05-06 DIAGNOSIS — E1169 Type 2 diabetes mellitus with other specified complication: Secondary | ICD-10-CM

## 2023-05-06 LAB — POCT GLYCOSYLATED HEMOGLOBIN (HGB A1C): Hemoglobin A1C: 8.3 % — AB (ref 4.0–5.6)

## 2023-05-06 MED ORDER — TOPIRAMATE 100 MG PO TABS: 100.0000 mg | ORAL_TABLET | Freq: Every day | ORAL | 3 refills | Status: DC

## 2023-05-06 NOTE — Patient Instructions (Addendum)
 Continue Toujeo  16 units daily  Continue Metformin  500 mg XR 2 tablets twice daily  Continue Farxiga  10 mg daily  Continue Glimepiride  2 mg , 1 tablet before breakfast   Take Topamax  50 mg, 1.5 tablets at bedtime until finished with the current bottle, the new prescription will be Topamax  100 mg daily  HOW TO TREAT LOW BLOOD SUGARS (Blood sugar LESS THAN 70 MG/DL) Please follow the RULE OF 15 for the treatment of hypoglycemia treatment (when your (blood sugars are less than 70 mg/dL)   STEP 1: Take 15 grams of carbohydrates when your blood sugar is low, which includes:  3-4 GLUCOSE TABS  OR 3-4 OZ OF JUICE OR REGULAR SODA OR ONE TUBE OF GLUCOSE GEL    STEP 2: RECHECK blood sugar in 15 MINUTES STEP 3: If your blood sugar is still low at the 15 minute recheck --> then, go back to STEP 1 and treat AGAIN with another 15 grams of carbohydrates.

## 2023-05-06 NOTE — Progress Notes (Unsigned)
 Name: Krista Dawson  MRN/ DOB: [id number], [birth year]   Age/ Sex: 58 y.o., female    PCP: Zilphia Hilt, Charyl Coppersmith, MD   Reason for Endocrinology Evaluation: Type 2 Diabetes Mellitus     Date of Initial Endocrinology Visit: 11/03/2021    PATIENT IDENTIFIER: Krista Dawson is a 58 y.o. female with a past medical history of HTN, NASH, Hx pancreatitis, and hypothyroidism. The patient presented for initial endocrinology clinic visit on 11/03/2021  for consultative assistance with her diabetes management.    HPI: Krista Dawson was    Diagnosed with DM 2005 Prior Medications tried/Intolerance: trulicity  1.5 mg  - abdominal pain with elevated lipase but no pancreatitis diagnosis . Insulin  started 08/2021          Hemoglobin A1c has ranged from 6.0% in 2022, peaking at 8.6% in 2020.  The patient was on Trulicity  until June 2022 when she developed severe abdominal pain, her lipase has been consistently elevated but there was no evidence of pancreatitis Due to persistent elevation in lipase she was seen by [facility]    Has family history of pancreatic cancer  Both parents with thyroid   disease    On her initial visit to our clinic she had an A1c of 7.9% she was on basal insulin , metformin , and Farxiga  which we adjusted  GAD-65 and islet cell antibody were undetectable   Started glipizide  and Topamax  for weight loss 09/2022 with an A1c of 8.4%  Due to her inability to take glipizide  twice daily I switched her to glimepiride  01/2023 , with an A1c of 8.4%  SUBJECTIVE:   During the last visit (01/29/2023): A1c 8.4%     Today (05/06/23): Krista Dawson is here for follow-up on diabetes management.  She checks her blood sugars multiple times daily. The patient has not had hypoglycemic episodes since the last clinic visit.  She continues to follow with GI for MASLD ([facility]) She has been following up with behavioral health for anxiety, which has helped   has   Denies nausea or vomiting except for minor nausea in the morning  On PPI 40 mg, BID  after an abnormal manometry  Denies constipation or diarrhea    HOME DIABETES REGIMEN: Metformin  500 mg XR  2 tabs BID  Glimepiride  2 mg daily  Farxiga  10 mg daily  Toujeo  16 units daily daily  Topamax  50 mg nightly    Statin: yes ACE-I/ARB: yes    CONTINUOUS GLUCOSE MONITORING RECORD INTERPRETATION    Dates of Recording: 4/15-4/28/2025  Sensor description:dexcom  Results statistics:   CGM use % of time 91  Average and SD 178/47  Time in range 52 %  % Time Above 180 43  % Time above 250 5  % Time Below target 0   Glycemic patterns summary: BG's remain at the upper limit of normal overnight and fluctuate throughout the day  Hyperglycemic episodes postprandial  Hypoglycemic episodes occurred N/A  Overnight periods: Variable    DIABETIC COMPLICATIONS: Microvascular complications:  cataract Denies: CKD , retinopathy, neuropathy  Last eye exam: Completed 05/2021  Macrovascular complications:   Denies: CAD, PVD, CVA   PAST HISTORY: Past Medical History:  Past Medical History:  Diagnosis Date   Allergy    Asthma    exercise induced - not used inhaler couple of yrs pt reported 10-03-2018   Blood transfusion without reported diagnosis    Colon polyp    Complication of anesthesia    went into Vtach after breast reduction surgery  1988/89   Depression    DM (diabetes mellitus) (HCC)    GERD (gastroesophageal reflux disease)    HTN (hypertension)    Hyperlipidemia    Hypothyroidism    NASH (nonalcoholic steatohepatitis)    Obesity (BMI 30.0-34.9)    Post menopausal problems    Sleep apnea    no c-pap   Transaminitis    Vitamin D  deficiency    Past Surgical History:  Past Surgical History:  Procedure Laterality Date   ADRENALECTOMY Right    APPENDECTOMY     BIOPSY  12/12/2020   Procedure: BIOPSY;  Surgeon: Normie Becton., MD;  Location: [facility];  Service: Gastroenterology;;   BREAST REDUCTION SURGERY     CHOLECYSTECTOMY     ESOPHAGEAL MANOMETRY N/A 12/05/2022   Procedure: ESOPHAGEAL MANOMETRY (EM);  Surgeon: Nannette Babe, MD;  Location: [facility];  Service: Gastroenterology;  Laterality: N/A;   ESOPHAGOGASTRODUODENOSCOPY N/A 12/12/2020   Procedure: ESOPHAGOGASTRODUODENOSCOPY (EGD);  Surgeon: Normie Becton., MD;  Location: [facility];  Service: Gastroenterology;  Laterality: N/A;   EUS N/A 12/12/2020   Procedure: UPPER ENDOSCOPIC ULTRASOUND (EUS) LINEAR;  Surgeon: Normie Becton., MD;  Location: [facility];  Service: Gastroenterology;  Laterality: N/A;   HYSTERECTOMY ABDOMINAL WITH SALPINGECTOMY     LIVER BIOPSY  2008, 2013   NASH   TONSILLECTOMY     UPPER GASTROINTESTINAL ENDOSCOPY      Social History:  reports that she quit smoking about 16 years ago. Her smoking use included cigarettes. She has never used smokeless tobacco. She reports current alcohol use. She reports that she does not use drugs. Family History:  Family History  Problem Relation Age of Onset   Breast cancer Mother    Colon polyps Mother    Stroke Mother 68   GER disease Father    Diabetes Father    Kidney disease Father    CAD Father    Other Father        cause of death listed as resp. failure   Colon polyps Sister    Stroke Sister    Diabetes Maternal Grandfather    Heart disease Maternal Grandfather    Diabetes Paternal Grandmother    CAD Paternal Grandfather    Pancreatic cancer Paternal Uncle    Pancreatic cancer Maternal Uncle    Colon cancer Neg Hx    Esophageal cancer Neg Hx    Stomach cancer Neg Hx    Rectal cancer Neg Hx      HOME MEDICATIONS: Allergies as of 05/06/2023       Reactions   Trulicity  [dulaglutide ]    Pancreatitis   Spironolactone Hives, Swelling   Paxil [paroxetine Hcl] Other (See Comments)   Other reaction(s): Other (comments) WORSENED DEPRESSION. WORSENED DEPRESSION.   Sulfa  Antibiotics Hives, Itching   Codeine Itching, Rash        Medication List        Accurate as of May 06, 2023 11:57 AM. If you have any questions, ask your nurse or doctor.          aspirin EC 81 MG tablet Take 81 mg by mouth every evening.   CAL-MAG PO Take by mouth.   Contour Next Test test strip Generic drug: glucose blood Check blood sugar 1 time daily   dapagliflozin  propanediol 10 MG Tabs tablet Commonly known as: Farxiga  Take 1 tablet (10 mg total) by mouth daily.   Dexcom G7 Sensor Misc Change sensors every 10 days DX E11.65  glimepiride  2 MG tablet Commonly known as: AMARYL  Take 1 tablet (2 mg total) by mouth daily before breakfast.   hydrochlorothiazide  25 MG tablet Commonly known as: HYDRODIURIL  Take 1 tablet (25 mg total) by mouth daily.   Insulin  Pen Needle 32G X 4 MM Misc 1 Device by Does not apply route daily in the afternoon.   lisinopril  20 MG tablet Commonly known as: ZESTRIL  Take 1 tablet (20 mg total) by mouth daily.   magnesium chloride 64 MG Tbec SR tablet Commonly known as: SLOW-MAG Take 1 tablet by mouth daily.   Melatonin 10 MG Caps Take 10 mg by mouth at bedtime.   metFORMIN  500 MG 24 hr tablet Commonly known as: GLUCOPHAGE -XR Take 2 tablets (1,000 mg total) by mouth 2 (two) times daily with a meal.   Microlet Lancets Misc by Does not apply route. Test 3-4 times daily (Micorlet Colored Lancets)   OMEGA 3 500 PO Take 1,000 mg by mouth.   pantoprazole  40 MG tablet Commonly known as: PROTONIX  Take 1 tablet (40 mg total) by mouth 2 (two) times daily before a meal.   Rezdiffra 80 MG Tabs Generic drug: Resmetirom Take 1 tablet by mouth daily.   rosuvastatin  10 MG tablet Commonly known as: CRESTOR  TAKE 1 TABLET DAILY   Synthroid  88 MCG tablet Generic drug: levothyroxine  TAKE 1 TABLET DAILY BEFORE BREAKFAST   topiramate  50 MG tablet Commonly known as: Topamax  Take 1 tablet (50 mg total) by mouth at bedtime.    Toujeo  Max SoloStar 300 UNIT/ML Solostar Pen Generic drug: insulin  glargine (2 Unit Dial) Inject 16 Units into the skin at bedtime.   triamcinolone  cream 0.1 % Commonly known as: KENALOG  Apply 1 application. topically 2 (two) times daily. What changed: additional instructions   venlafaxine  XR 37.5 MG 24 hr capsule Commonly known as: EFFEXOR -XR TAKE 1 CAPSULE DAILY   Vitamin D3 50 MCG (2000 UT) Tabs Take 2,000 Units by mouth in the morning.         ALLERGIES: Allergies  Allergen Reactions   Trulicity  [Dulaglutide ]     Pancreatitis    Spironolactone Hives and Swelling   Paxil [Paroxetine Hcl] Other (See Comments)    Other reaction(s): Other (comments) WORSENED DEPRESSION. WORSENED DEPRESSION.    Sulfa Antibiotics Hives and Itching   Codeine Itching and Rash     REVIEW OF SYSTEMS: A comprehensive ROS was conducted with the patient and is negative except as per HPI    OBJECTIVE:   VITAL SIGNS: BP 120/74 (BP Location: Left Arm, Patient Position: Sitting, Cuff Size: Normal)   Pulse 87   Ht 5' 5.5" (1.664 m)   Wt 209 lb (94.8 kg)   SpO2 97%   BMI 34.25 kg/m    PHYSICAL EXAM:  General: Pt appears well and is in NAD  Neck:  Thyroid : Thyroid  size normal.  No goiter or nodules appreciated.   Lungs: Clear with good BS bilat   Heart: RRR   Extremities:  Lower extremities - No pretibial edema.   Neuro: MS is good with appropriate affect, pt is alert and Ox3    DM foot exam: 09/18/2022  The skin of the feet is intact without sores or ulcerations. The pedal pulses are 2+ on right and 2+ on left. The sensation is intact to a screening 5.07, 10 gram monofilament bilaterally     DATA REVIEWED:  Lab Results  Component Value Date   HGBA1C 8.3 (A) 05/06/2023   HGBA1C 8.4 (A) 01/29/2023   HGBA1C 8.4 (  A) 09/18/2022     Latest Reference Range & Units 02/19/23 10:32  Sodium 135 - 145 mEq/L 137  Potassium 3.5 - 5.1 mEq/L 4.3  Chloride 96 - 112 mEq/L 106  CO2  19 - 32 mEq/L 22  Glucose 70 - 99 mg/dL 161 (H)  BUN 6 - 23 mg/dL 25 (H)  Creatinine 0.96 - 1.20 mg/dL 0.45  Calcium  8.4 - 10.5 mg/dL 40.9  Alkaline Phosphatase 39 - 117 U/L 80  Albumin 3.5 - 5.2 g/dL 4.5  AST 0 - 37 U/L 41 (H)  ALT 0 - 35 U/L 56 (H)  Total Protein 6.0 - 8.3 g/dL 7.3  Total Bilirubin 0.2 - 1.2 mg/dL 0.5  GFR >81.19 mL/min 61.84    Latest Reference Range & Units 02/19/23 10:32  Creatinine,U mg/dL 14.7  Microalb, Ur 0.0 - 1.9 mg/dL 1.0  MICROALB/CREAT RATIO 0.0 - 30.0 mg/g 19.9     ASSESSMENT / PLAN / RECOMMENDATIONS:   1) Type 2 Diabetes Mellitus, With improving glycemic control, Without  complications - Most recent A1c of 8.3%. Goal A1c < 7.0 %.     -Despite the A1c remaining at the same level, the patient has been noted with weight loss, and her average BG's has trended down from 221 Mg/DL from September, 8295 to 185 Mg/DL during  this visit -Patient not a candidate for GLP-1 agonist nor DPP 4 inhibitors due to history of pancreatitis while on Trulicity  -GAD-65 and islet cell antibody negative  -She has not been able to consistently take glipizide  twice daily, I have recommended switching to glimepiride  that way she can take it once daily before the first meal of the day -I will increase Toujeo  as below   MEDICATIONS: Increase Toujeo  16 units daily Start glimepiride  2 mg, 1 tablet daily Continue metformin  500 mg XR 2 tabs BID Continue Farxiga  10 mg daily  EDUCATION / INSTRUCTIONS: BG monitoring instructions: Patient is instructed to check her blood sugars  3 times a day, before meals . Call [facility] if: BG persistently < 70  I reviewed the Rule of 15 for the treatment of hypoglycemia in detail with the patient. Literature supplied.   2) Diabetic complications:  Eye: Does not have known diabetic retinopathy.  Neuro/ Feet: Does not have known diabetic peripheral neuropathy. Renal: Patient does not have known baseline CKD. She is  on  an ACEI/ARB at present.     3)Hypothyroidism:   -TSH normal  Medication Continue levothyroxine  88 mcg daily  4) Dyslipidemia:  -LDL at goal -No changes at this time -Will continue to monitor   Medication  Rosuvastatin  20 mg daily   5) Weight Loss  - She desponded well to topiramate  50 mg, patient has been noting weight gain, we have opted to increase the dose as below    Medication Increase topiramate  100 mg, 1  tablet at bedtime daily   Follow-up in 3 months   Signed electronically by: Natale Bail, MD  [facility]  [facility] [address] [place] [place], [place] [address] Phone: [phone] FAX: [phone]   CC: Zilphia Hilt, Charyl Coppersmith, MD [address] [place] [address] Phone: [phone]  Fax: [phone]    Return to Endocrinology clinic as below: Future Appointments  Date Time Provider Department Center  05/17/2023 11:00 AM Cottle, Tom Found, LCSW [id number] None  06/10/2023  1:00 PM Marvia Slocumb T, PA-C [facility] None

## 2023-05-17 ENCOUNTER — Encounter: Payer: Self-pay | Admitting: Internal Medicine

## 2023-05-17 ENCOUNTER — Ambulatory Visit (INDEPENDENT_AMBULATORY_CARE_PROVIDER_SITE_OTHER): Admitting: Psychology

## 2023-05-17 DIAGNOSIS — F4323 Adjustment disorder with mixed anxiety and depressed mood: Secondary | ICD-10-CM

## 2023-05-17 DIAGNOSIS — F330 Major depressive disorder, recurrent, mild: Secondary | ICD-10-CM | POA: Diagnosis not present
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
## 2023-05-30 ENCOUNTER — Ambulatory Visit: Admitting: Psychology

## 2023-06-04 ENCOUNTER — Other Ambulatory Visit: Payer: Self-pay | Admitting: Internal Medicine

## 2023-06-04 ENCOUNTER — Other Ambulatory Visit: Payer: Self-pay

## 2023-06-04 ENCOUNTER — Encounter: Payer: Self-pay | Admitting: Internal Medicine

## 2023-06-04 MED ORDER — INSULIN PEN NEEDLE 32G X 4 MM MISC: 1.0000 | Freq: Every day | 4 refills | Status: DC

## 2023-06-04 MED ORDER — DEXCOM G7 SENSOR MISC: 3 refills | Status: DC

## 2023-06-05 ENCOUNTER — Ambulatory Visit (INDEPENDENT_AMBULATORY_CARE_PROVIDER_SITE_OTHER): Admitting: Psychology

## 2023-06-05 DIAGNOSIS — F331 Major depressive disorder, recurrent, moderate: Secondary | ICD-10-CM

## 2023-06-06 NOTE — Progress Notes (Signed)
 [facility] Counselor/Therapist Progress Note  Patient ID: Krista Dawson, MRN: [id number],    Date:06/05/2023  Time Spent: 60 minutes  Time In:  8:00  Time Out:  9:00  Treatment Type: Individual Therapy  Reported Symptoms: sadness,anxiety  Mental Status Exam: Appearance:  Casual     Behavior: Appropriate  Motor: Normal  Speech/Language:  Clear and Coherent  Affect: Blunt  Mood: Anxious and sad  Thought process: normal  Thought content:   WNL  Sensory/Perceptual disturbances:   WNL  Orientation: oriented to person, place, time/date, and situation  Attention: Good  Concentration: Good  Memory: WNL  Fund of knowledge:  Good  Insight:   Good  Judgment:  Good  Impulse Control: Good   Risk Assessment: Danger to Self:  No Self-injurious Behavior: No Danger to Others: No Duty to Warn:no Physical Aggression / Violence:No  Access to Firearms a concern: No  Gang Involvement:No   Subjective: The patient attended a face-to-face individual therapy session in the office today.  The patient presents as anxious and depressed.  The patient reports that things have been difficult at work and she has not yet received her promotion.  He also talked about another situation that sounds a little like they may be trying to push her to make a decision to leave possibly.  I encouraged the patient to work on her resume so that she can make a decision about what she wants to do with her job because she does seem very unhappy where she is.  We talked about her triggers with the chaos that is going on and how things are being managed.  We will talk further on how to detach a little bit from the outcome and not be so caught up in her job being her life. Interventions: Cognitive Behavioral Therapy, Assertiveness/Communication, and Insight-Oriented  Diagnosis:Major depressive disorder, recurrent episode, moderate (HCC)  Plan: Client Abilities/Strengths  Intelligent, caring, insightful   Client Treatment Preferences  Outpatient individual therapy  Client Statement of Needs  "I need help to deal with all of the stressors going on that are contributing to my depression"  Treatment Level  Outpatient Individual therapy  Symptoms  Decrease or loss of appetite.: No Description Entered (Status: maintained). Depressed or irritable  mood.: No Description Entered (Status: maintained). Lack of energy.: No Description Entered (Status:  maintained).  Problems Addressed  Unipolar Depression, Unipolar Depression  Goals 1. Develop healthy thinking patterns and beliefs about self, others, and the world that lead to the alleviation and help prevent the relapse of  depression. Objective Learn and implement behavioral strategies to overcome depression. Target Date: 12/12/2023 Frequency: biWeekly Progress: 30 Modality: individual  Related Interventions 1. Assist the client in developing skills that increase the likelihood of deriving pleasure from  behavioral activation (e.g., assertiveness skills, developing an exercise plan, less internal/more  external focus, increased social involvement); reinforce success. Objective Learn and implement problem-solving and decision-making skills. Target Date: 12/12/2023 Frequency: biWeekly Progress: 20 Modality: individual Objective Verbalize an understanding and resolution of current interpersonal problems. Target Date: 12/12/2023 Frequency: biWeekly Progress: 10 Modality: individual Related Interventions 1. For interpersonal disputes, help the client explore the relationship, the nature of the dispute,  whether it has reached an impasse, and available options to resolve it including learning and  implementing conflict-resolution skills; if the relationship has reached an impasse, consider  ways to change the impasse or to end the relationship. Objective Identify and replace thoughts and beliefs that support depression. Target Date:  12/12/2023 Frequency:  biWeekly Progress: 20 Modality: individual Related Interventions 1. Facilitate and reinforce the client's shift from biased depressive self-talk and beliefs to realitybased cognitive messages that enhance self-confidence and increase adaptive actions (see  "Positive Self-Talk" in the Adult Psychotherapy Homework Planner by Beacher Bottoms). Objective Describe current and past experiences with depression including their impact on functioning and  attempts to resolve it. Target Date: 12/12/2023 Frequency: biWeekly Progress: 10 Modality: individual  Related Interventions 1. Encourage the client to share his/her thoughts and feelings of depression; express empathy and  build rapport while identifying primary cognitive, behavioral, interpersonal, or other  contributors to depression. 2. Recognize, accept, and cope with feelings of depression. Diagnosis F43.23  Adjustment Disorder with mixed anxiety and depression  Major Depressive disorder, recurrent, moderate Conditions For Discharge Achievement of treatment goals and objectives  Jakerria Kingbird G Chella Chapdelaine, LCSW

## 2023-06-10 ENCOUNTER — Ambulatory Visit: Admitting: Physician Assistant

## 2023-06-10 ENCOUNTER — Encounter: Payer: Self-pay | Admitting: Physician Assistant

## 2023-06-10 VITALS — BP 120/74 | HR 93 | Ht 61.0 in | Wt 207.0 lb

## 2023-06-10 DIAGNOSIS — F411 Generalized anxiety disorder: Secondary | ICD-10-CM

## 2023-06-10 DIAGNOSIS — F4323 Adjustment disorder with mixed anxiety and depressed mood: Secondary | ICD-10-CM | POA: Diagnosis not present

## 2023-06-10 DIAGNOSIS — F331 Major depressive disorder, recurrent, moderate: Secondary | ICD-10-CM

## 2023-06-10 DIAGNOSIS — N959 Unspecified menopausal and perimenopausal disorder: Secondary | ICD-10-CM | POA: Diagnosis not present

## 2023-06-10 MED ORDER — ALPRAZOLAM 0.5 MG PO TABS: 0.2500 mg | ORAL_TABLET | Freq: Three times a day (TID) | ORAL | 1 refills | Status: DC | PRN

## 2023-06-10 MED ORDER — VENLAFAXINE HCL ER 37.5 MG PO CP24: ORAL_CAPSULE | ORAL | Status: DC

## 2023-06-10 NOTE — Progress Notes (Signed)
 [facility] MD/PA/NP Initial Note  06/10/2023 8:58 AM Krista Dawson  MRN:  [id number]  Chief Complaint:  Chief Complaint   Establish Care    HPI:  She's sad, not herself, tired, doesn't want to get out of bed.  No motivation.  When not working, she usually watches tv. Not missing work. Sx worse for a year or so.   Never feels rested when she gets up.  Often falls asleep on the couch.  No trouble falling asleep.  Staying asleep is nl too.  ADLs and personal hygiene are normal.  Appetite-eats for comfort.  Denies any changes in concentration, making decisions, or remembering things.  Denies suicidal or homicidal thoughts.  Moved here in 2020 for work, no family or friends here.  In a Bible study which is good. Gets overwhelmed when there's a lot going on, likes to be structured. Rare PA. Feels anxious and on edge a lot.  Irritable, little things get on her nerves.  Patient denies increased energy with decreased need for sleep, increased talkativeness, racing thoughts, impulsivity or risky behaviors, increased spending, increased libido, grandiosity, paranoia, or hallucinations.  Visit Diagnosis:    ICD-10-CM   1. Major depressive disorder, recurrent episode, moderate (HCC)  F33.1     2. Post menopausal problems  N95.9 venlafaxine  XR (EFFEXOR -XR) 37.5 MG 24 hr capsule    3. Adjustment disorder with mixed anxiety and depressed mood  F43.23     4. Generalized anxiety disorder  F41.1       Past Psychiatric History:   Past medications for mental health diagnoses include: Paxil, Prozac, Wellbutrin, lexapro, Effexor  started in early 2019 after hysterectomy for hot flashes and mood Ambien, Xanax  No mental health hospitalizations No suicide attempts  Past Medical History:  Past Medical History:  Diagnosis Date   Allergy    Asthma    exercise induced - not used inhaler couple of yrs pt reported 10-03-2018   Blood transfusion without reported diagnosis    Colon polyp     Complication of anesthesia    went into Vtach after breast reduction surgery 1988/89   Depression    DM (diabetes mellitus) (HCC)    GERD (gastroesophageal reflux disease)    HTN (hypertension)    Hyperlipidemia    Hypothyroidism    NASH (nonalcoholic steatohepatitis)    Obesity (BMI 30.0-34.9)    Post menopausal problems    Sleep apnea    no c-pap   Transaminitis    Vitamin D  deficiency     Past Surgical History:  Procedure Laterality Date   ADRENALECTOMY Right    APPENDECTOMY     BIOPSY  12/12/2020   Procedure: BIOPSY;  Surgeon: Normie Becton., MD;  Location: [facility];  Service: Gastroenterology;;   BREAST REDUCTION SURGERY     CHOLECYSTECTOMY     ESOPHAGEAL MANOMETRY N/A 12/05/2022   Procedure: ESOPHAGEAL MANOMETRY (EM);  Surgeon: Nannette Babe, MD;  Location: [facility];  Service: Gastroenterology;  Laterality: N/A;   ESOPHAGOGASTRODUODENOSCOPY N/A 12/12/2020   Procedure: ESOPHAGOGASTRODUODENOSCOPY (EGD);  Surgeon: Normie Becton., MD;  Location: [facility];  Service: Gastroenterology;  Laterality: N/A;   EUS N/A 12/12/2020   Procedure: UPPER ENDOSCOPIC ULTRASOUND (EUS) LINEAR;  Surgeon: Normie Becton., MD;  Location: [facility];  Service: Gastroenterology;  Laterality: N/A;   HYSTERECTOMY ABDOMINAL WITH SALPINGECTOMY     LIVER BIOPSY  2008, 2013   NASH   TONSILLECTOMY     UPPER GASTROINTESTINAL ENDOSCOPY      Family Psychiatric  History:  See below  Family History:  Family History  Problem Relation Age of Onset   Depression Mother    Breast cancer Mother    Colon polyps Mother    Stroke Mother 51   GER disease Father    Diabetes Father    Kidney disease Father    CAD Father    Other Father        cause of death listed as resp. failure   Stroke Sister 21   Colon polyps Sister    Pancreatic cancer Maternal Uncle    Pancreatic cancer Paternal Uncle    Diabetes Maternal Grandfather    Heart disease Maternal Grandfather     Depression Maternal Grandmother    CAD Paternal Grandfather    Diabetes Paternal Grandmother    Colon cancer Neg Hx    Esophageal cancer Neg Hx    Stomach cancer Neg Hx    Rectal cancer Neg Hx     Social History:  Social History   Socioeconomic History   Marital status: Single    Spouse name: Not on file   Number of children: 0   Years of education: Not on file   Highest education level: Associate degree: academic program  Occupational History   Occupation: Research scientist (medical)  Tobacco Use   Smoking status: Former    Current packs/day: 0.00    Types: Cigarettes    Quit date: 2009    Years since quitting: 16.4   Smokeless tobacco: Never   Tobacco comments:    quit early 2000 per pt  Vaping Use   Vaping status: Never Used  Substance and Sexual Activity   Alcohol use: Yes    Comment: occasional, maybe 1-2 per month   Drug use: Never   Sexual activity: Not Currently    Birth control/protection: Surgical    Comment: post menopausal  Other Topics Concern   Not on file  Social History Narrative   Single, project lead, community outreach.   Dad was ETOH. Split family. Chaotic childhood.  Never abused.    Grew up [place], transferred to [place] in 2020.       Legal-none   Caffeine-3-4 coffee   Religious-Christian, 'very grounded in my faith'   Social Drivers of Corporate investment banker Strain: Low Risk  (06/10/2023)   Overall Financial Resource Strain (CARDIA)    Difficulty of Paying Living Expenses: Not hard at all  Food Insecurity: No Food Insecurity (06/10/2023)   Hunger Vital Sign    Worried About Running Out of Food in the Last Year: Never true    Ran Out of Food in the Last Year: Never true  Transportation Needs: No Transportation Needs (06/10/2023)   PRAPARE - Administrator, Civil Service (Medical): No    Lack of Transportation (Non-Medical): No  Physical Activity: Inactive (06/10/2023)   Exercise Vital Sign    Days of Exercise per Week: 0 days     Minutes of Exercise per Session: 0 min  Stress: No Stress Concern Present (02/15/2023)   [employer] of Occupational Health - Occupational Stress Questionnaire    Feeling of Stress : Not at all  Recent Concern: Stress - Medium Risk (01/03/2023)   Received from [facility]   Stress    Stress in your Life: High    Dealing with Stress: Very effective  Social Connections: Moderately Isolated (06/10/2023)   Social Connection and Isolation Panel [NHANES]    Frequency of Communication with Friends and Family:  Once a week    Frequency of Social Gatherings with Friends and Family: Once a week    Attends Religious Services: More than 4 times per year    Active Member of [employer] or Organizations: Yes    Attends Engineer, structural: More than 4 times per year    Marital Status: Never married   Allergies:  Allergies  Allergen Reactions   Trulicity  [Dulaglutide ]     Pancreatitis    Spironolactone Hives and Swelling   Paxil [Paroxetine Hcl] Other (See Comments)    Other reaction(s): Other (comments) WORSENED DEPRESSION. WORSENED DEPRESSION.    Sulfa Antibiotics Hives and Itching   Codeine Itching and Rash    Metabolic Disorder Labs: Lab Results  Component Value Date   HGBA1C 8.3 (A) 05/06/2023   No results found for: "PROLACTIN" Lab Results  Component Value Date   CHOL 124 09/18/2022   TRIG 200.0 (H) 09/18/2022   HDL 38.70 (L) 09/18/2022   CHOLHDL 3 09/18/2022   VLDL 40.0 09/18/2022   LDLCALC 45 09/18/2022   LDLCALC 60 12/23/2020   Lab Results  Component Value Date   TSH 2.45 09/18/2022   TSH 0.58 12/23/2020    Therapeutic Level Labs: No results found for: "LITHIUM" No results found for: "VALPROATE" No results found for: "CBMZ"  Current Medications: Current Outpatient Medications  Medication Sig Dispense Refill   ALPRAZolam (XANAX) 0.5 MG tablet Take 0.5-1 tablets (0.25-0.5 mg total) by mouth 3 (three) times daily as needed for anxiety. 60 tablet 1    aspirin EC 81 MG tablet Take 81 mg by mouth every evening.     Calcium -Magnesium (CAL-MAG PO) Take by mouth.     Cholecalciferol (VITAMIN D3) 50 MCG (2000 UT) TABS Take 2,000 Units by mouth in the morning.     Continuous Glucose Sensor (DEXCOM G7 SENSOR) MISC Change sensors every 10 days DX E11.65 9 each 3   dapagliflozin  propanediol (FARXIGA ) 10 MG TABS tablet Take 1 tablet (10 mg total) by mouth daily. 90 tablet 1   glimepiride  (AMARYL ) 2 MG tablet Take 1 tablet (2 mg total) by mouth daily before breakfast. 90 tablet 3   glucose blood (CONTOUR NEXT TEST) test strip Check blood sugar 1 time daily 100 each 12   hydrochlorothiazide  (HYDRODIURIL ) 25 MG tablet Take 1 tablet (25 mg total) by mouth daily. 90 tablet 1   insulin  glargine, 2 Unit Dial, (TOUJEO  MAX SOLOSTAR) 300 UNIT/ML Solostar Pen Inject 16 Units into the skin at bedtime. 15 mL 3   Insulin  Pen Needle 32G X 4 MM MISC 1 Device by Does not apply route daily in the afternoon. 100 each 4   levothyroxine  (SYNTHROID ) 88 MCG tablet TAKE 1 TABLET DAILY BEFORE BREAKFAST 90 tablet 1   lisinopril  (ZESTRIL ) 20 MG tablet Take 1 tablet (20 mg total) by mouth daily. 90 tablet 1   magnesium chloride (SLOW-MAG) 64 MG TBEC SR tablet Take 1 tablet by mouth daily.     Melatonin 10 MG CAPS Take 10 mg by mouth at bedtime.     metFORMIN  (GLUCOPHAGE -XR) 500 MG 24 hr tablet Take 2 tablets (1,000 mg total) by mouth 2 (two) times daily with a meal. 360 tablet 3   Microlet Lancets MISC by Does not apply route. Test 3-4 times daily (Micorlet Colored Lancets)     Omega-3 Fatty Acids (OMEGA 3 500 PO) Take 1,000 mg by mouth.     pantoprazole  (PROTONIX ) 40 MG tablet Take 1 tablet (40 mg total) by mouth  2 (two) times daily before a meal. 180 tablet 3   REZDIFFRA 80 MG TABS Take 1 tablet by mouth daily.     rosuvastatin  (CRESTOR ) 10 MG tablet TAKE 1 TABLET DAILY 90 tablet 1   topiramate  (TOPAMAX ) 100 MG tablet Take 1 tablet (100 mg total) by mouth at bedtime. (Patient not  taking: Reported on 06/10/2023) 90 tablet 3   triamcinolone  cream (KENALOG ) 0.1 % Apply 1 application. topically 2 (two) times daily. (Patient not taking: Reported on 06/10/2023) 30 g 0   venlafaxine  XR (EFFEXOR -XR) 37.5 MG 24 hr capsule TAKE 2 CAPSULEs DAILY     No current facility-administered medications for this visit.   Medication Side Effects: none  Orders placed this visit:  No orders of the defined types were placed in this encounter.  Psychiatric Specialty Exam:  Review of Systems  Constitutional:  Positive for fatigue.  HENT: Negative.    Eyes: Negative.   Respiratory: Negative.    Cardiovascular: Negative.   Gastrointestinal: Negative.   Endocrine: Negative.   Genitourinary: Negative.   Musculoskeletal:  Positive for back pain and neck pain.  Skin: Negative.   Allergic/Immunologic: Negative.   Neurological: Negative.   Hematological: Negative.   Psychiatric/Behavioral:         See HPI    Blood pressure 120/74, pulse 93, height 5\' 1"  (1.549 m), weight 207 lb (93.9 kg).Body mass index is 39.11 kg/m.  General Appearance: Casual, Well Groomed, and Obese  Eye Contact:  Good  Speech:  Clear and Coherent and Normal Rate  Volume:  Normal  Mood:  Depressed  Affect:  Depressed and Tearful  Thought Process:  Goal Directed and Descriptions of Associations: Circumstantial  Orientation:  Full (Time, Place, and Person)  Thought Content: Logical   Suicidal Thoughts:  No  Homicidal Thoughts:  No  Memory:  WNL  Judgement:  Good  Insight:  Good  Psychomotor Activity:  Normal  Concentration:  Concentration: Good  Recall:  Good  Fund of Knowledge: Good  Language: Good  Assets:  Communication Skills Desire for Improvement Financial Resources/Insurance Housing Transportation Vocational/Educational  ADL's:  Intact  Cognition: WNL  Prognosis:  Good   Screenings:  GAD-7    Flowsheet Row [facility] Visit [facility]03/20[facility] in [facility]  Total  GAD-7 Score 2      PHQ2-9    Flowsheet Row Office Visit from 06/10/2023 in [facility] Office Visit from 02/19/2023 in [facility] Office Visit from 05/03/2022 in [facility] Office Visit from 12/21/2021 in [facility] [place] Nutrition from 12/11/2021 in [facility]  - [facility] Of [facility]  PHQ-2 Total Score 2 0 0 0 0  PHQ-9 Total Score 11 -- 1 0 --      Flowsheet Row Admission (Discharged) from 12/12/2020 in [facility]  C-SSRS RISK CATEGORY No Risk      Receiving Psychotherapy: Yes  with Bambi Cottle every 2 weeks  Treatment Plan/Recommendations:   PDMP reviewed.  No controlled substances listed. I provided 60 minutes of face to face time during this encounter, including time spent before and after the visit in records review, medical decision making, counseling pertinent to today's visit, and charting.   Discussed the depression and anxiety.  I recommend increasing Effexor  versus changing it and/or adding another antidepressant.  This was originally prescribed for postmenopausal symptoms but will  help anxiety and depression.  She has never been on a higher dose than 37.5.  She agrees.  I also recommend adding Xanax, benefits, risk and side effects were discussed and she would like to try it.  Restart Xanax 0.5 mg, 1/2-1 p.o. 3 times daily as needed anxiety. Increase Effexor  37.5 mg, 2 p.o. daily.  If she is not feeling any improvement at all within 1 month she can call and we will increase the dose to 112.5 mg. Continue vitamins as per med list. Continue therapy with Crisoforo Dolores, [facility]. Return in 6 to 8 weeks.  Marvia Slocumb, PA-C

## 2023-06-13 ENCOUNTER — Ambulatory Visit: Admitting: Psychology

## 2023-06-27 ENCOUNTER — Ambulatory Visit (INDEPENDENT_AMBULATORY_CARE_PROVIDER_SITE_OTHER): Admitting: Psychology

## 2023-06-27 DIAGNOSIS — F4323 Adjustment disorder with mixed anxiety and depressed mood: Secondary | ICD-10-CM

## 2023-06-27 DIAGNOSIS — F331 Major depressive disorder, recurrent, moderate: Secondary | ICD-10-CM

## 2023-06-30 NOTE — Progress Notes (Signed)
 [facility] Counselor/Therapist Progress Note  Patient ID: Krista Dawson, MRN: [id number],    Date:06/27/2023  Time Spent: 60 minutes  Time In:  12:00  Time Out:  1:00  Treatment Type: Individual Therapy  Reported Symptoms: sadness,anxiety  Mental Status Exam: Appearance:  Casual     Behavior: Appropriate  Motor: Normal  Speech/Language:  Clear and Coherent  Affect: Blunt  Mood: pleasant  Thought process: normal  Thought content:   WNL  Sensory/Perceptual disturbances:   WNL  Orientation: oriented to person, place, time/date, and situation  Attention: Good  Concentration: Good  Memory: WNL  Fund of knowledge:  Good  Insight:   Good  Judgment:  Good  Impulse Control: Good   Risk Assessment: Danger to Self:  No Self-injurious Behavior: No Danger to Others: No Duty to Warn:no Physical Aggression / Violence:No  Access to Firearms a concern: No  Gang Involvement:No   Subjective: The patient attended a face-to-face individual therapy session in the office today.  The patient presents as pleasant and cooperative.  The patient reports that she feels like she is doing better because her medication was increased a few weeks ago.  She went to [place] and saw Verneita Cooks, the PA ,and that seems to be going well.  We talked about how she is doing at work and she reports that she feels like she has been able to detach a little better since she has been on the medication.  We talked about the need for her to continue with the push of getting her resume together as things do not seem to be changing so much at work and she is struggling to feel like she is accepted there and also she is working constantly.  He has not gotten the promotion that was promised to her yet and we talked about her letting that be for now and just seeing what else she can find moving forward.  I recommended that she get the book, Just One Thing.  We talked about her meditating and using some  time to medicate on the things that are written in that book.  She is going on vacation over the next 2 weeks and we talked about her not being too attached to her job during that time.  Interventions: Cognitive Behavioral Therapy, Assertiveness/Communication, and Insight-Oriented  Diagnosis:Major depressive disorder, recurrent episode, moderate (HCC)  Adjustment disorder with mixed anxiety and depressed mood  Plan: Client Abilities/Strengths  Intelligent, caring, insightful  Client Treatment Preferences  Outpatient individual therapy  Client Statement of Needs  I need help to deal with all of the stressors going on that are contributing to my depression  Treatment Level  Outpatient Individual therapy  Symptoms  Decrease or loss of appetite.: No Description Entered (Status: maintained). Depressed or irritable  mood.: No Description Entered (Status: maintained). Lack of energy.: No Description Entered (Status:  maintained).  Problems Addressed  Unipolar Depression, Unipolar Depression  Goals 1. Develop healthy thinking patterns and beliefs about self, others, and the world that lead to the alleviation and help prevent the relapse of  depression. Objective Learn and implement behavioral strategies to overcome depression. Target Date: 12/12/2023 Frequency: biWeekly Progress: 30 Modality: individual  Related Interventions 1. Assist the client in developing skills that increase the likelihood of deriving pleasure from  behavioral activation (e.g., assertiveness skills, developing an exercise plan, less internal/more  external focus, increased social involvement); reinforce success. Objective Learn and implement problem-solving and decision-making skills. Target Date: 12/12/2023 Frequency: biWeekly Progress: 30  Modality: individual Objective Verbalize an understanding and resolution of current interpersonal problems. Target Date: 12/12/2023 Frequency: biWeekly Progress: 10  Modality: individual Related Interventions 1. For interpersonal disputes, help the client explore the relationship, the nature of the dispute,  whether it has reached an impasse, and available options to resolve it including learning and  implementing conflict-resolution skills; if the relationship has reached an impasse, consider  ways to change the impasse or to end the relationship. Objective Identify and replace thoughts and beliefs that support depression. Target Date: 12/12/2023 Frequency: biWeekly Progress: 30 Modality: individual Related Interventions 1. Facilitate and reinforce the client's shift from biased depressive self-talk and beliefs to realitybased cognitive messages that enhance self-confidence and increase adaptive actions (see  Positive Self-Talk in the Adult Psychotherapy Homework Planner by Jenniffer). Objective Describe current and past experiences with depression including their impact on functioning and  attempts to resolve it. Target Date: 12/12/2023 Frequency: biWeekly Progress: 10 Modality: individual  Related Interventions 1. Encourage the client to share his/her thoughts and feelings of depression; express empathy and  build rapport while identifying primary cognitive, behavioral, interpersonal, or other  contributors to depression. 2. Recognize, accept, and cope with feelings of depression. Diagnosis F43.23  Adjustment Disorder with mixed anxiety and depression  Major Depressive disorder, recurrent, moderate Conditions For Discharge Achievement of treatment goals and objectives  Cleto Claggett G Zaydan Papesh, LCSW

## 2023-07-14 ENCOUNTER — Other Ambulatory Visit: Payer: Self-pay | Admitting: Internal Medicine

## 2023-07-14 DIAGNOSIS — I10 Essential (primary) hypertension: Secondary | ICD-10-CM

## 2023-07-15 ENCOUNTER — Other Ambulatory Visit: Payer: Self-pay | Admitting: Internal Medicine

## 2023-07-15 DIAGNOSIS — E1169 Type 2 diabetes mellitus with other specified complication: Secondary | ICD-10-CM

## 2023-07-18 ENCOUNTER — Ambulatory Visit (INDEPENDENT_AMBULATORY_CARE_PROVIDER_SITE_OTHER): Admitting: Psychology

## 2023-07-18 DIAGNOSIS — F331 Major depressive disorder, recurrent, moderate: Secondary | ICD-10-CM

## 2023-07-18 DIAGNOSIS — F4323 Adjustment disorder with mixed anxiety and depressed mood: Secondary | ICD-10-CM | POA: Diagnosis not present

## 2023-07-19 NOTE — Progress Notes (Signed)
 [facility] Counselor/Therapist Progress Note  Patient ID: Krista Dawson, MRN: [id number],    Date:07/18/2023  Time Spent: 60 minutes  Time In:  5:00  Time Out:  6:00  Treatment Type: Individual Therapy  Reported Symptoms: sadness,anxiety  Mental Status Exam: Appearance:  Casual     Behavior: Appropriate  Motor: Normal  Speech/Language:  Clear and Coherent  Affect: Blunt  Mood: pleasant  Thought process: normal  Thought content:   WNL  Sensory/Perceptual disturbances:   WNL  Orientation: oriented to person, place, time/date, and situation  Attention: Good  Concentration: Good  Memory: WNL  Fund of knowledge:  Good  Insight:   Good  Judgment:  Good  Impulse Control: Good   Risk Assessment: Danger to Self:  No Self-injurious Behavior: No Danger to Others: No Duty to Warn:no Physical Aggression / Violence:No  Access to Firearms a concern: No  Gang Involvement:No   Subjective: The patient attended a face-to-face individual therapy session in the office today.  The patient presents as pleasant and cooperative.  The patient is continuing to have issues with her situation at work.  It does seem that they may be targeting her for some disciplinary action or something at some point.  We talked about how for her not to be as reactionary and more responsive.  The patient did have a moment and an interaction she had yesterday with her new supervisor that was reactionary and she was able to recognize that that is what that was.  She does have an appointment with her job coach in the next week to update her resume.  I encouraged her to go ahead and do that and to work with herself on helping her self detach a little bit more from the situation at her job. Interventions: Cognitive Behavioral Therapy, Assertiveness/Communication, and Insight-Oriented  Diagnosis:Major depressive disorder, recurrent episode, moderate (HCC)  Adjustment disorder with mixed anxiety and  depressed mood  Plan: Client Abilities/Strengths  Intelligent, caring, insightful  Client Treatment Preferences  Outpatient individual therapy  Client Statement of Needs  I need help to deal with all of the stressors going on that are contributing to my depression  Treatment Level  Outpatient Individual therapy  Symptoms  Decrease or loss of appetite.: No Description Entered (Status: maintained). Depressed or irritable  mood.: No Description Entered (Status: maintained). Lack of energy.: No Description Entered (Status:  maintained).  Problems Addressed  Unipolar Depression, Unipolar Depression  Goals 1. Develop healthy thinking patterns and beliefs about self, others, and the world that lead to the alleviation and help prevent the relapse of  depression. Objective Learn and implement behavioral strategies to overcome depression. Target Date: 12/12/2023 Frequency: biWeekly Progress: 40 Modality: individual  Related Interventions 1. Assist the client in developing skills that increase the likelihood of deriving pleasure from  behavioral activation (e.g., assertiveness skills, developing an exercise plan, less internal/more  external focus, increased social involvement); reinforce success. Objective Learn and implement problem-solving and decision-making skills. Target Date: 12/12/2023 Frequency: biWeekly Progress: 30 Modality: individual Objective Verbalize an understanding and resolution of current interpersonal problems. Target Date: 12/12/2023 Frequency: biWeekly Progress: 10 Modality: individual Related Interventions 1. For interpersonal disputes, help the client explore the relationship, the nature of the dispute,  whether it has reached an impasse, and available options to resolve it including learning and  implementing conflict-resolution skills; if the relationship has reached an impasse, consider  ways to change the impasse or to end the  relationship. Objective Identify and replace thoughts and  beliefs that support depression. Target Date: 12/12/2023 Frequency: biWeekly Progress: 30 Modality: individual Related Interventions 1. Facilitate and reinforce the client's shift from biased depressive self-talk and beliefs to realitybased cognitive messages that enhance self-confidence and increase adaptive actions (see  Positive Self-Talk in the Adult Psychotherapy Homework Planner by Jenniffer). Objective Describe current and past experiences with depression including their impact on functioning and  attempts to resolve it. Target Date: 12/12/2023 Frequency: biWeekly Progress: 10 Modality: individual  Related Interventions 1. Encourage the client to share his/her thoughts and feelings of depression; express empathy and  build rapport while identifying primary cognitive, behavioral, interpersonal, or other  contributors to depression. 2. Recognize, accept, and cope with feelings of depression. Diagnosis F43.23  Adjustment Disorder with mixed anxiety and depression  Major Depressive disorder, recurrent, moderate Conditions For Discharge Achievement of treatment goals and objectives  Lalla Laham G Alvah Gilder, LCSW

## 2023-07-29 ENCOUNTER — Encounter: Payer: Self-pay | Admitting: Physician Assistant

## 2023-07-29 ENCOUNTER — Ambulatory Visit (INDEPENDENT_AMBULATORY_CARE_PROVIDER_SITE_OTHER): Admitting: Physician Assistant

## 2023-07-29 DIAGNOSIS — F4323 Adjustment disorder with mixed anxiety and depressed mood: Secondary | ICD-10-CM

## 2023-07-29 DIAGNOSIS — F331 Major depressive disorder, recurrent, moderate: Secondary | ICD-10-CM

## 2023-07-29 DIAGNOSIS — F411 Generalized anxiety disorder: Secondary | ICD-10-CM | POA: Diagnosis not present

## 2023-07-29 MED ORDER — VENLAFAXINE HCL ER 150 MG PO CP24: 150.0000 mg | ORAL_CAPSULE | Freq: Every day | ORAL | 0 refills | Status: DC

## 2023-07-29 NOTE — Progress Notes (Signed)
 [facility]  Patient ID: Krista Dawson,  MRN: [id number]  PCP: Theophilus Andrews, Tully GRADE, MD  Date of Evaluation: 07/29/2023 Time spent:20 minutes  Chief Complaint:  Chief Complaint   Anxiety; Follow-up    HISTORY/CURRENT STATUS: HPI For 6 wk med check.  Not a lot of motivation. Stems from job. Not happy. No energy. Still no promotion, discrimination at work. 2023-08-10 is hard, Mom died in 2023-08-10, 14 years ago. Dad died in 2023/08/10, 4 years. Yesterday felt very down and sad. Is working on her resume. When we increased the Effexor  at LOV, initially it did feel 'uplift' but not now.  Tired all the time. Not always sleeping well. Not restful sleep.  She listens to evening meditations.   No mania, delirium, AH/VH.  No SI/HI.  Xanax  is helpful, has taken it at work a few times, it doesn't cause drowsiness. Not having PA, just gets overwhelmed easily.   Denies dizziness, syncope, seizures, numbness, tingling, tremor, tics, unsteady gait, slurred speech, confusion. Denies muscle or joint pain, stiffness, or dystonia.  Individual Medical History/ Review of Systems: Changes? :No   Past medications for mental health diagnoses include: Paxil, Prozac, Wellbutrin, lexapro, Effexor  started in early 2019 after hysterectomy for hot flashes and mood Ambien, Xanax   No mental health hospitalizations No suicide attempts Allergies: Trulicity  [dulaglutide ], Spironolactone, Paxil [paroxetine hcl], Sulfa antibiotics, and Codeine  Current Medications:  Current Outpatient Medications:    ALPRAZolam  (XANAX ) 0.5 MG tablet, Take 0.5-1 tablets (0.25-0.5 mg total) by mouth 3 (three) times daily as needed for anxiety., Disp: 60 tablet, Rfl: 1   aspirin EC 81 MG tablet, Take 81 mg by mouth every evening., Disp: , Rfl:    Cholecalciferol (VITAMIN D3) 50 MCG (2000 UT) TABS, Take 2,000 Units by mouth in the morning., Disp: , Rfl:    Continuous Glucose Sensor (DEXCOM G7 SENSOR) MISC, Change sensors every 10  days DX E11.65, Disp: 9 each, Rfl: 3   dapagliflozin  propanediol (FARXIGA ) 10 MG TABS tablet, TAKE 1 TABLET DAILY, Disp: 90 tablet, Rfl: 0   glimepiride  (AMARYL ) 2 MG tablet, Take 1 tablet (2 mg total) by mouth daily before breakfast., Disp: 90 tablet, Rfl: 3   glucose blood (CONTOUR NEXT TEST) test strip, Check blood sugar 1 time daily, Disp: 100 each, Rfl: 12   hydrochlorothiazide  (HYDRODIURIL ) 25 MG tablet, TAKE 1 TABLET DAILY, Disp: 90 tablet, Rfl: 1   insulin  glargine, 2 Unit Dial, (TOUJEO  MAX SOLOSTAR) 300 UNIT/ML Solostar Pen, Inject 16 Units into the skin at bedtime., Disp: 15 mL, Rfl: 3   Insulin  Pen Needle 32G X 4 MM MISC, 1 Device by Does not apply route daily in the afternoon., Disp: 100 each, Rfl: 4   levothyroxine  (SYNTHROID ) 88 MCG tablet, TAKE 1 TABLET DAILY BEFORE BREAKFAST, Disp: 90 tablet, Rfl: 1   lisinopril  (ZESTRIL ) 20 MG tablet, TAKE 1 TABLET DAILY, Disp: 90 tablet, Rfl: 1   magnesium chloride (SLOW-MAG) 64 MG TBEC SR tablet, Take 1 tablet by mouth daily., Disp: , Rfl:    Melatonin 10 MG CAPS, Take 10 mg by mouth at bedtime., Disp: , Rfl:    metFORMIN  (GLUCOPHAGE -XR) 500 MG 24 hr tablet, Take 2 tablets (1,000 mg total) by mouth 2 (two) times daily with a meal., Disp: 360 tablet, Rfl: 3   Microlet Lancets MISC, by Does not apply route. Test 3-4 times daily (Micorlet Colored Lancets), Disp: , Rfl:    Omega-3 Fatty Acids (OMEGA 3 500 PO), Take 1,000 mg by mouth., Disp: ,  Rfl:    pantoprazole  (PROTONIX ) 40 MG tablet, Take 1 tablet (40 mg total) by mouth 2 (two) times daily before a meal., Disp: 180 tablet, Rfl: 3   REZDIFFRA 80 MG TABS, Take 1 tablet by mouth daily., Disp: , Rfl:    rosuvastatin  (CRESTOR ) 10 MG tablet, TAKE 1 TABLET DAILY, Disp: 90 tablet, Rfl: 1   venlafaxine  XR (EFFEXOR -XR) 150 MG 24 hr capsule, Take 1 capsule (150 mg total) by mouth daily with breakfast., Disp: 90 capsule, Rfl: 0   Calcium -Magnesium (CAL-MAG PO), Take by mouth., Disp: , Rfl:    topiramate   (TOPAMAX ) 100 MG tablet, Take 1 tablet (100 mg total) by mouth at bedtime. (Patient not taking: Reported on 06/10/2023), Disp: 90 tablet, Rfl: 3   triamcinolone  cream (KENALOG ) 0.1 %, Apply 1 application. topically 2 (two) times daily. (Patient not taking: Reported on 06/10/2023), Disp: 30 g, Rfl: 0 Medication Side Effects: none  Family Medical/ Social History: Changes? No  MENTAL HEALTH EXAM:  There were no vitals taken for this visit.There is no height or weight on file to calculate BMI.  General Appearance: Casual and Well Groomed  Eye Contact:  Good  Speech:  Clear and Coherent and Normal Rate  Volume:  Normal  Mood:  Euthymic  Affect:  Congruent  Thought Process:  Goal Directed and Descriptions of Associations: Circumstantial  Orientation:  Full (Time, Place, and Person)  Thought Content: Logical   Suicidal Thoughts:  No  Homicidal Thoughts:  No  Memory:  WNL  Judgement:  Good  Insight:  Good  Psychomotor Activity:  Normal  Concentration:  Concentration: Good  Recall:  Good  Fund of Knowledge: Good  Language: Good  Assets:  Communication Skills Desire for Improvement Financial Resources/Insurance Housing Transportation Vocational/Educational  ADL's:  Intact  Cognition: WNL  Prognosis:  Good   DIAGNOSES:    ICD-10-CM   1. Major depressive disorder, recurrent episode, moderate (HCC)  F33.1     2. Adjustment disorder with mixed anxiety and depressed mood  F43.23     3. Generalized anxiety disorder  F41.1       Receiving Psychotherapy: Yes   with Bambi Cottle, LCSW  RECOMMENDATIONS:   PDMP reviewed.  Xanax  filled 06/10/2023. I provided approximately 20 minutes of face to face time during this encounter, including time spent before and after the visit in records review, medical decision making, counseling pertinent to today's visit, and charting.   Treatment options were discussed.  The Effexor  does seem to be effective but needs to be at a higher dose.  Recommend  increasing that and making no other changes.  Continue Xanax  0.5 mg, 1/2-1 p.o. 3 times daily as needed anxiety. Increase Effexor  XR 37.5 mg to 3 po every day for 2 weeks, then 150 mg daily.  Continue therapy with Bambi Cottle LCSW. Return in 6 to 8 weeks.  Verneita Cooks, PA-C

## 2023-08-02 ENCOUNTER — Ambulatory Visit (INDEPENDENT_AMBULATORY_CARE_PROVIDER_SITE_OTHER): Admitting: Psychology

## 2023-08-02 DIAGNOSIS — F331 Major depressive disorder, recurrent, moderate: Secondary | ICD-10-CM

## 2023-08-02 DIAGNOSIS — F4323 Adjustment disorder with mixed anxiety and depressed mood: Secondary | ICD-10-CM

## 2023-08-04 NOTE — Progress Notes (Signed)
 [facility] Counselor/Therapist Progress Note  Patient ID: Mairead Schwarzkopf, MRN: [id number],    Date:08/02/2023  Time Spent: 57 minutes  Time In:  9:03  Time Out:  10:00  Treatment Type: Individual Therapy  Reported Symptoms: sadness,anxiety  Mental Status Exam: Appearance:  Casual     Behavior: Appropriate  Motor: Normal  Speech/Language:  Clear and Coherent  Affect: Blunt  Mood: pleasant  Thought process: normal  Thought content:   WNL  Sensory/Perceptual disturbances:   WNL  Orientation: oriented to person, place, time/date, and situation  Attention: Good  Concentration: Good  Memory: WNL  Fund of knowledge:  Good  Insight:   Good  Judgment:  Good  Impulse Control: Good   Risk Assessment: Danger to Self:  No Self-injurious Behavior: No Danger to Others: No Duty to Warn:no Physical Aggression / Violence:No  Access to Firearms a concern: No  Gang Involvement:No   Subjective: The patient attended a face-to-face individual therapy session in the office today.  The patient presents as pleasant and cooperative.  The patient reports that work continues to be challenging.  She states that she had a situation this week where she did stand up for herself.  We talked about moving forward and talked about how she can continue to help herself manage her emotions better in the work environment.  She reports that she does have a meeting coming up on August 4 with her managers as a review.  She is having some concerns that it is possible she might be laid off.  We talked about how to respond if needed in the meeting.  We talked about her taking appalls and taking a deep breath.  She seems to be doing much better on the Effexor  and does not seem to be as reactive.  We will continue to work with her on helping her decide what her next steps are.  Interventions: Cognitive Behavioral Therapy, Assertiveness/Communication, and Insight-Oriented  Diagnosis:Major depressive  disorder, recurrent episode, moderate (HCC)  Adjustment disorder with mixed anxiety and depressed mood  Plan: Client Abilities/Strengths  Intelligent, caring, insightful  Client Treatment Preferences  Outpatient individual therapy  Client Statement of Needs  I need help to deal with all of the stressors going on that are contributing to my depression  Treatment Level  Outpatient Individual therapy  Symptoms  Decrease or loss of appetite.: No Description Entered (Status: maintained). Depressed or irritable  mood.: No Description Entered (Status: maintained). Lack of energy.: No Description Entered (Status:  maintained).  Problems Addressed  Unipolar Depression, Unipolar Depression  Goals 1. Develop healthy thinking patterns and beliefs about self, others, and the world that lead to the alleviation and help prevent the relapse of  depression. Objective Learn and implement behavioral strategies to overcome depression. Target Date: 12/12/2023 Frequency: biWeekly Progress: 40 Modality: individual  Related Interventions 1. Assist the client in developing skills that increase the likelihood of deriving pleasure from  behavioral activation (e.g., assertiveness skills, developing an exercise plan, less internal/more  external focus, increased social involvement); reinforce success. Objective Learn and implement problem-solving and decision-making skills. Target Date: 12/12/2023 Frequency: biWeekly Progress: 30 Modality: individual Objective Verbalize an understanding and resolution of current interpersonal problems. Target Date: 12/12/2023 Frequency: biWeekly Progress: 10 Modality: individual Related Interventions 1. For interpersonal disputes, help the client explore the relationship, the nature of the dispute,  whether it has reached an impasse, and available options to resolve it including learning and  implementing conflict-resolution skills; if the relationship has reached an  impasse, consider  ways to change the impasse or to end the relationship. Objective Identify and replace thoughts and beliefs that support depression. Target Date: 12/12/2023 Frequency: biWeekly Progress: 30 Modality: individual Related Interventions 1. Facilitate and reinforce the client's shift from biased depressive self-talk and beliefs to realitybased cognitive messages that enhance self-confidence and increase adaptive actions (see  Positive Self-Talk in the Adult Psychotherapy Homework Planner by Jenniffer). Objective Describe current and past experiences with depression including their impact on functioning and  attempts to resolve it. Target Date: 12/12/2023 Frequency: biWeekly Progress: 10 Modality: individual  Related Interventions 1. Encourage the client to share his/her thoughts and feelings of depression; express empathy and  build rapport while identifying primary cognitive, behavioral, interpersonal, or other  contributors to depression. 2. Recognize, accept, and cope with feelings of depression. Diagnosis F43.23  Adjustment Disorder with mixed anxiety and depression  Major Depressive disorder, recurrent, moderate Conditions For Discharge Achievement of treatment goals and objectives  Vennessa Affinito G Chong January, LCSW

## 2023-08-08 ENCOUNTER — Encounter: Payer: Self-pay | Admitting: Physician Assistant

## 2023-08-09 ENCOUNTER — Other Ambulatory Visit: Payer: Self-pay | Admitting: Internal Medicine

## 2023-08-15 ENCOUNTER — Ambulatory Visit: Admitting: Psychology

## 2023-08-30 ENCOUNTER — Ambulatory Visit: Admitting: Psychology

## 2023-08-30 DIAGNOSIS — F331 Major depressive disorder, recurrent, moderate: Secondary | ICD-10-CM

## 2023-08-30 DIAGNOSIS — F411 Generalized anxiety disorder: Secondary | ICD-10-CM

## 2023-08-30 DIAGNOSIS — F4323 Adjustment disorder with mixed anxiety and depressed mood: Secondary | ICD-10-CM | POA: Diagnosis not present

## 2023-08-30 NOTE — Progress Notes (Signed)
 [facility] Counselor/Therapist Progress Note  Patient ID: Krista Dawson, MRN: [id number],    Date:08/30/2023  Time Spent: 57 minutes  Time In:  9:03  Time Out:  10:00  Treatment Type: Individual Therapy  Reported Symptoms: sadness,anxiety  Mental Status Exam: Appearance:  Casual     Behavior: Appropriate  Motor: Normal  Speech/Language:  Clear and Coherent  Affect: Blunt  Mood: apathetic  Thought process: normal  Thought content:   WNL  Sensory/Perceptual disturbances:   WNL  Orientation: oriented to person, place, time/date, and situation  Attention: Good  Concentration: Good  Memory: WNL  Fund of knowledge:  Good  Insight:   Good  Judgment:  Good  Impulse Control: Good   Risk Assessment: Danger to Self:  No Self-injurious Behavior: No Danger to Others: No Duty to Warn:no Physical Aggression / Violence:No  Access to Firearms a concern: No  Gang Involvement:No   Subjective: The patient attended a face-to-face individual therapy session in the office today.  The patient reports that she feels apathetic.  She states that she has been feeling that way for the last few days.  We talked about the need for her to go ahead and get her resume done as she has been procrastinating about that.  We talked about the grief process and about how it is possible that she may be procrastinating because she realizes that she has to get to an acceptance place around the work relationship being over.  I encouraged her to go ahead and get this done and I recommended that she possibly used AIto help her get it situated.  We talked about how resistance sometimes causes more problems and that sometimes you have to lean into the change.  Interventions: Cognitive Behavioral Therapy, Assertiveness/Communication, and Insight-Oriented  Diagnosis:Major depressive disorder, recurrent episode, moderate (HCC)  Adjustment disorder with mixed anxiety and depressed mood  Generalized  anxiety disorder  Plan: Client Abilities/Strengths  Intelligent, caring, insightful  Client Treatment Preferences  Outpatient individual therapy  Client Statement of Needs  I need help to deal with all of the stressors going on that are contributing to my depression  Treatment Level  Outpatient Individual therapy  Symptoms  Decrease or loss of appetite.: No Description Entered (Status: maintained). Depressed or irritable  mood.: No Description Entered (Status: maintained). Lack of energy.: No Description Entered (Status:  maintained).  Problems Addressed  Unipolar Depression, Unipolar Depression  Goals 1. Develop healthy thinking patterns and beliefs about self, others, and the world that lead to the alleviation and help prevent the relapse of  depression. Objective Learn and implement behavioral strategies to overcome depression. Target Date: 12/12/2023 Frequency: biWeekly Progress: 40 Modality: individual  Related Interventions 1. Assist the client in developing skills that increase the likelihood of deriving pleasure from  behavioral activation (e.g., assertiveness skills, developing an exercise plan, less internal/more  external focus, increased social involvement); reinforce success. Objective Learn and implement problem-solving and decision-making skills. Target Date: 12/12/2023 Frequency: biWeekly Progress: 30 Modality: individual Objective Verbalize an understanding and resolution of current interpersonal problems. Target Date: 12/12/2023 Frequency: biWeekly Progress: 20 Modality: individual Related Interventions 1. For interpersonal disputes, help the client explore the relationship, the nature of the dispute,  whether it has reached an impasse, and available options to resolve it including learning and  implementing conflict-resolution skills; if the relationship has reached an impasse, consider  ways to change the impasse or to end the  relationship. Objective Identify and replace thoughts and beliefs that support depression.  Target Date: 12/12/2023 Frequency: biWeekly Progress: 40 Modality: individual Related Interventions 1. Facilitate and reinforce the client's shift from biased depressive self-talk and beliefs to realitybased cognitive messages that enhance self-confidence and increase adaptive actions (see  Positive Self-Talk in the Adult Psychotherapy Homework Planner by Jenniffer). Objective Describe current and past experiences with depression including their impact on functioning and  attempts to resolve it. Target Date: 12/12/2023 Frequency: biWeekly Progress: 20 Modality: individual  Related Interventions 1. Encourage the client to share his/her thoughts and feelings of depression; express empathy and  build rapport while identifying primary cognitive, behavioral, interpersonal, or other  contributors to depression. 2. Recognize, accept, and cope with feelings of depression. Diagnosis F43.23  Adjustment Disorder with mixed anxiety and depression  Major Depressive disorder, recurrent, moderate Conditions For Discharge Achievement of treatment goals and objectives  Krista Dawson G Krista Inzunza, LCSW

## 2023-09-04 ENCOUNTER — Ambulatory Visit: Admitting: Internal Medicine

## 2023-09-13 ENCOUNTER — Ambulatory Visit (INDEPENDENT_AMBULATORY_CARE_PROVIDER_SITE_OTHER): Admitting: Physician Assistant

## 2023-09-13 ENCOUNTER — Encounter: Payer: Self-pay | Admitting: Physician Assistant

## 2023-09-13 DIAGNOSIS — F331 Major depressive disorder, recurrent, moderate: Secondary | ICD-10-CM

## 2023-09-13 DIAGNOSIS — F411 Generalized anxiety disorder: Secondary | ICD-10-CM | POA: Diagnosis not present

## 2023-09-13 MED ORDER — BUPROPION HCL ER (XL) 150 MG PO TB24
150.0000 mg | ORAL_TABLET | Freq: Every day | ORAL | 1 refills | Status: AC
Start: 2023-09-13 — End: ?

## 2023-09-13 NOTE — Progress Notes (Signed)
 [facility]  Patient ID: Krista Dawson,  MRN: [id number]  PCP: Theophilus Andrews, Tully GRADE, MD  Date of Evaluation: 09/13/2023 Time spent:20 minutes  Chief Complaint:  Chief Complaint   Anxiety; Depression; Follow-up    HISTORY/CURRENT STATUS: HPI For routine med check.  Motivation and energy are still low. Feels 'good.'  Not sure exactly how much the increase in Effexor  has helped.  Work is going well.   No extreme sadness, tearfulness, or feelings of hopelessness.  Sleeps well.  ADLs and personal hygiene are normal.   Denies any changes in concentration, making decisions, or remembering things.  Appetite has not changed.  Weight is stable.   Gets overwhelmed easily.  Xanax  is helpful when needed.  No mania, delirium, AH/VH.  No SI/HI.  Individual Medical History/ Review of Systems: Changes? :No   Past medications for mental health diagnoses include: Paxil, Prozac, Wellbutrin , lexapro, Effexor  started in early 2019 after hysterectomy for hot flashes and mood Ambien, Xanax   No mental health hospitalizations No suicide attempts Allergies: Trulicity  [dulaglutide ], Spironolactone, Paxil [paroxetine hcl], Sulfa antibiotics, and Codeine  Current Medications:  Current Outpatient Medications:    ALPRAZolam  (XANAX ) 0.5 MG tablet, Take 0.5-1 tablets (0.25-0.5 mg total) by mouth 3 (three) times daily as needed for anxiety., Disp: 60 tablet, Rfl: 1   aspirin EC 81 MG tablet, Take 81 mg by mouth every evening., Disp: , Rfl:    buPROPion  (WELLBUTRIN  XL) 150 MG 24 hr tablet, Take 1 tablet (150 mg total) by mouth daily., Disp: 30 tablet, Rfl: 1   Calcium -Magnesium (CAL-MAG PO), Take by mouth., Disp: , Rfl:    Cholecalciferol (VITAMIN D3) 50 MCG (2000 UT) TABS, Take 2,000 Units by mouth in the morning., Disp: , Rfl:    Continuous Glucose Sensor (DEXCOM G7 SENSOR) MISC, CHANGE SENSORS EVERY 10    DAYS, Disp: 9 each, Rfl: 3   dapagliflozin  propanediol (FARXIGA ) 10 MG TABS tablet,  TAKE 1 TABLET DAILY, Disp: 90 tablet, Rfl: 0   glimepiride  (AMARYL ) 2 MG tablet, Take 1 tablet (2 mg total) by mouth daily before breakfast., Disp: 90 tablet, Rfl: 3   glucose blood (CONTOUR NEXT TEST) test strip, Check blood sugar 1 time daily, Disp: 100 each, Rfl: 12   hydrochlorothiazide  (HYDRODIURIL ) 25 MG tablet, TAKE 1 TABLET DAILY, Disp: 90 tablet, Rfl: 1   insulin  glargine, 2 Unit Dial, (TOUJEO  MAX SOLOSTAR) 300 UNIT/ML Solostar Pen, Inject 16 Units into the skin at bedtime., Disp: 15 mL, Rfl: 3   Insulin  Pen Needle 32G X 4 MM MISC, 1 Device by Does not apply route daily in the afternoon., Disp: 100 each, Rfl: 4   levothyroxine  (SYNTHROID ) 88 MCG tablet, TAKE 1 TABLET DAILY BEFORE BREAKFAST, Disp: 90 tablet, Rfl: 1   lisinopril  (ZESTRIL ) 20 MG tablet, TAKE 1 TABLET DAILY, Disp: 90 tablet, Rfl: 1   magnesium chloride (SLOW-MAG) 64 MG TBEC SR tablet, Take 1 tablet by mouth daily., Disp: , Rfl:    Melatonin 10 MG CAPS, Take 10 mg by mouth at bedtime., Disp: , Rfl:    metFORMIN  (GLUCOPHAGE -XR) 500 MG 24 hr tablet, Take 2 tablets (1,000 mg total) by mouth 2 (two) times daily with a meal., Disp: 360 tablet, Rfl: 3   Microlet Lancets MISC, by Does not apply route. Test 3-4 times daily (Micorlet Colored Lancets), Disp: , Rfl:    Omega-3 Fatty Acids (OMEGA 3 500 PO), Take 1,000 mg by mouth., Disp: , Rfl:    pantoprazole  (PROTONIX ) 40 MG tablet, Take  1 tablet (40 mg total) by mouth 2 (two) times daily before a meal., Disp: 180 tablet, Rfl: 3   REZDIFFRA 80 MG TABS, Take 1 tablet by mouth daily., Disp: , Rfl:    rosuvastatin  (CRESTOR ) 10 MG tablet, TAKE 1 TABLET DAILY, Disp: 90 tablet, Rfl: 1   venlafaxine  XR (EFFEXOR -XR) 150 MG 24 hr capsule, Take 1 capsule (150 mg total) by mouth daily with breakfast., Disp: 90 capsule, Rfl: 0   topiramate  (TOPAMAX ) 100 MG tablet, Take 1 tablet (100 mg total) by mouth at bedtime. (Patient not taking: Reported on 06/10/2023), Disp: 90 tablet, Rfl: 3   triamcinolone   cream (KENALOG ) 0.1 %, Apply 1 application. topically 2 (two) times daily. (Patient not taking: Reported on 06/10/2023), Disp: 30 g, Rfl: 0 Medication Side Effects: none  Family Medical/ Social History: Changes? No  MENTAL HEALTH EXAM:  There were no vitals taken for this visit.There is no height or weight on file to calculate BMI.  General Appearance: Casual and Well Groomed  Eye Contact:  Good  Speech:  Clear and Coherent and Normal Rate  Volume:  Normal  Mood:  Euthymic  Affect:  Congruent  Thought Process:  Goal Directed and Descriptions of Associations: Circumstantial  Orientation:  Full (Time, Place, and Person)  Thought Content: Logical   Suicidal Thoughts:  No  Homicidal Thoughts:  No  Memory:  WNL  Judgement:  Good  Insight:  Good  Psychomotor Activity:  Normal  Concentration:  Concentration: Good and Attention Span: Good  Recall:  Good  Fund of Knowledge: Good  Language: Good  Assets:  Communication Skills Desire for Improvement Financial Resources/Insurance Housing Transportation Vocational/Educational  ADL's:  Intact  Cognition: WNL  Prognosis:  Good   DIAGNOSES:    ICD-10-CM   1. Major depressive disorder, recurrent episode, moderate (HCC)  F33.1     2. Generalized anxiety disorder  F41.1      Receiving Psychotherapy: Yes   with Bambi Cottle, LCSW  RECOMMENDATIONS:   PDMP reviewed.  Xanax  filled 06/10/2023. I provided approximately 20 minutes of face to face time during this encounter, including time spent before and after the visit in records review, medical decision making, counseling pertinent to today's visit, and charting.   Different options were discussed.  Recommend adding Wellbutrin , it can help motivation and energy. Benefits, risks, SE disc and she accepts.   Continue Xanax  0.5 mg, 1/2-1 p.o. 3 times daily as needed anxiety. Start Wellbutrin  XL 150 mg, 1 q am.  Continue Effexor  XR 150 mg daily.  Continue therapy with Bambi Cottle  LCSW. Return in 6 weeks.  Verneita Cooks, PA-C

## 2023-09-19 ENCOUNTER — Ambulatory Visit (INDEPENDENT_AMBULATORY_CARE_PROVIDER_SITE_OTHER): Admitting: Psychology

## 2023-09-19 DIAGNOSIS — F411 Generalized anxiety disorder: Secondary | ICD-10-CM

## 2023-09-19 DIAGNOSIS — F4323 Adjustment disorder with mixed anxiety and depressed mood: Secondary | ICD-10-CM

## 2023-09-19 DIAGNOSIS — F331 Major depressive disorder, recurrent, moderate: Secondary | ICD-10-CM

## 2023-09-19 NOTE — Progress Notes (Signed)
 [facility] Counselor/Therapist Progress Note  Patient ID: Krista Dawson, MRN: [id number],    Date:09/19/2023  Time Spent: 60 minutes  Time In:  5:00  Time Out:  6:00  Treatment Type: Individual Therapy  Reported Symptoms: sadness,anxiety  Mental Status Exam: Appearance:  Casual     Behavior: Appropriate  Motor: Normal  Speech/Language:  Clear and Coherent  Affect: Blunt  Mood: pleasant  Thought process: normal  Thought content:   WNL  Sensory/Perceptual disturbances:   WNL  Orientation: oriented to person, place, time/date, and situation  Attention: Good  Concentration: Good  Memory: WNL  Fund of knowledge:  Good  Insight:   Good  Judgment:  Good  Impulse Control: Good   Risk Assessment: Danger to Self:  No Self-injurious Behavior: No Danger to Others: No Duty to Warn:no Physical Aggression / Violence:No  Access to Firearms a concern: No  Gang Involvement:No   Subjective: The patient attended a face-to-face individual therapy session in the office today.  Patient reports that she went to see Zebedee Schimke at [facility] and she put her on Wellbutrin  in addition to her Effexor .  She reports that she already feels a difference since she has been put on that medication and she reports that she feels so much better and seems to be handling things much better now.  We talked about what was happening at work and she finally did get a promotion and that seems to be going well and it was retroactive to 1st of September.  In addition, the patient did some introspection and realized that she will probably was presenting to her teammates as not wanting to be there and she also said that she felt like she might have been giving off the message that she is entitled.  The patient has made some good progress in therapy we have 3 more sessions and we will look at how much longer she needs to continue in therapy after those sessions.  Interventions: Cognitive Behavioral  Therapy, Assertiveness/Communication, and Insight-Oriented  Diagnosis:Major depressive disorder, recurrent episode, moderate (HCC)  Generalized anxiety disorder  Adjustment disorder with mixed anxiety and depressed mood  Plan: Client Abilities/Strengths  Intelligent, caring, insightful  Client Treatment Preferences  Outpatient individual therapy  Client Statement of Needs  I need help to deal with all of the stressors going on that are contributing to my depression  Treatment Level  Outpatient Individual therapy  Symptoms  Decrease or loss of appetite.: No Description Entered (Status: maintained). Depressed or irritable  mood.: No Description Entered (Status: maintained). Lack of energy.: No Description Entered (Status:  maintained).  Problems Addressed  Unipolar Depression, Unipolar Depression  Goals 1. Develop healthy thinking patterns and beliefs about self, others, and the world that lead to the alleviation and help prevent the relapse of  depression. Objective Learn and implement behavioral strategies to overcome depression. Target Date: 12/12/2023 Frequency: biWeekly Progress: 40 Modality: individual  Related Interventions 1. Assist the client in developing skills that increase the likelihood of deriving pleasure from  behavioral activation (e.g., assertiveness skills, developing an exercise plan, less internal/more  external focus, increased social involvement); reinforce success. Objective Learn and implement problem-solving and decision-making skills. Target Date: 12/12/2023 Frequency: biWeekly Progress: 30 Modality: individual Objective Verbalize an understanding and resolution of current interpersonal problems. Target Date: 12/12/2023 Frequency: biWeekly Progress: 20 Modality: individual Related Interventions 1. For interpersonal disputes, help the client explore the relationship, the nature of the dispute,  whether it has reached an impasse, and available  options to resolve it including learning and  implementing conflict-resolution skills; if the relationship has reached an impasse, consider  ways to change the impasse or to end the relationship. Objective Identify and replace thoughts and beliefs that support depression. Target Date: 12/12/2023 Frequency: biWeekly Progress: 40 Modality: individual Related Interventions 1. Facilitate and reinforce the client's shift from biased depressive self-talk and beliefs to realitybased cognitive messages that enhance self-confidence and increase adaptive actions (see  Positive Self-Talk in the Adult Psychotherapy Homework Planner by Jenniffer). Objective Describe current and past experiences with depression including their impact on functioning and  attempts to resolve it. Target Date: 12/12/2023 Frequency: biWeekly Progress: 20 Modality: individual  Related Interventions 1. Encourage the client to share his/her thoughts and feelings of depression; express empathy and  build rapport while identifying primary cognitive, behavioral, interpersonal, or other  contributors to depression. 2. Recognize, accept, and cope with feelings of depression. Diagnosis F43.23  Adjustment Disorder with mixed anxiety and depression  Major Depressive disorder, recurrent, moderate Conditions For Discharge Achievement of treatment goals and objectives  Ramere Downs G Neveyah Garzon, LCSW

## 2023-09-20 ENCOUNTER — Ambulatory Visit: Admitting: Psychology

## 2023-09-25 ENCOUNTER — Ambulatory Visit: Admitting: Internal Medicine

## 2023-09-25 ENCOUNTER — Encounter: Payer: Self-pay | Admitting: Internal Medicine

## 2023-09-25 VITALS — BP 120/80 | HR 87 | Ht 61.0 in | Wt 206.0 lb

## 2023-09-25 DIAGNOSIS — E039 Hypothyroidism, unspecified: Secondary | ICD-10-CM | POA: Diagnosis not present

## 2023-09-25 DIAGNOSIS — E1165 Type 2 diabetes mellitus with hyperglycemia: Secondary | ICD-10-CM | POA: Diagnosis not present

## 2023-09-25 DIAGNOSIS — E785 Hyperlipidemia, unspecified: Secondary | ICD-10-CM | POA: Diagnosis not present

## 2023-09-25 DIAGNOSIS — E1169 Type 2 diabetes mellitus with other specified complication: Secondary | ICD-10-CM

## 2023-09-25 LAB — POCT GLYCOSYLATED HEMOGLOBIN (HGB A1C): Hemoglobin A1C: 8 % — AB (ref 4.0–5.6)

## 2023-09-25 MED ORDER — GLIMEPIRIDE 4 MG PO TABS: 4.0000 mg | ORAL_TABLET | Freq: Every day | ORAL | 3 refills | Status: AC

## 2023-09-25 MED ORDER — METFORMIN HCL ER 500 MG PO TB24: 1000.0000 mg | ORAL_TABLET | Freq: Two times a day (BID) | ORAL | 3 refills | Status: AC

## 2023-09-25 MED ORDER — TOUJEO MAX SOLOSTAR 300 UNIT/ML ~~LOC~~ SOPN: 12.0000 [IU] | PEN_INJECTOR | Freq: Every day | SUBCUTANEOUS | 3 refills | Status: AC

## 2023-09-25 MED ORDER — INSULIN PEN NEEDLE 32G X 4 MM MISC: 1.0000 | Freq: Every day | 4 refills | Status: AC

## 2023-09-25 NOTE — Patient Instructions (Signed)
 Decrease Toujeo  12 units daily  Continue Metformin  500 mg XR 2 tablets twice daily  Continue Farxiga  10 mg daily  Increase Glimepiride  4 mg, before breakfast   HOW TO TREAT LOW BLOOD SUGARS (Blood sugar LESS THAN 70 MG/DL) Please follow the RULE OF 15 for the treatment of hypoglycemia treatment (when your (blood sugars are less than 70 mg/dL)   STEP 1: Take 15 grams of carbohydrates when your blood sugar is low, which includes:  3-4 GLUCOSE TABS  OR 3-4 OZ OF JUICE OR REGULAR SODA OR ONE TUBE OF GLUCOSE GEL    STEP 2: RECHECK blood sugar in 15 MINUTES STEP 3: If your blood sugar is still low at the 15 minute recheck --> then, go back to STEP 1 and treat AGAIN with another 15 grams of carbohydrates.

## 2023-09-25 NOTE — Progress Notes (Signed)
 Name: Krista Dawson  MRN/ DOB: [id number], [birth year]   Age/ Sex: 58 y.o., female    PCP: Theophilus Andrews, Tully GRADE, MD   Reason for Endocrinology Evaluation: Type 2 Diabetes Mellitus     Date of Initial Endocrinology Visit: 11/03/2021    PATIENT IDENTIFIER: Krista Dawson is a 58 y.o. female with a past medical history of HTN, NASH, Hx pancreatitis, and hypothyroidism. The patient presented for initial endocrinology clinic visit on 11/03/2021  for consultative assistance with her diabetes management.    HPI: Krista Dawson was    Diagnosed with DM 2005 Prior Medications tried/Intolerance: trulicity  1.5 mg  - abdominal pain with elevated lipase but no pancreatitis diagnosis . Insulin  started 08/2021          Hemoglobin A1c has ranged from 6.0% in 2022, peaking at 8.6% in 2020.  The patient was on Trulicity  until June 2022 when she developed severe abdominal pain, her lipase has been consistently elevated but there was no evidence of pancreatitis Due to persistent elevation in lipase she was seen by [facility] gastro[facility]ology    Has family history of pancreatic cancer  Both parents with thyroid   disease    On her initial visit to our clinic she had an A1c of 7.9% she was on basal insulin , metformin , and Farxiga  which we adjusted  GAD-65 and islet cell antibody were undetectable   Started glipizide  and Topamax  for weight loss 09/2022 with an A1c of 8.4%  Due to her inability to take glipizide  twice daily I switched her to glimepiride  01/2023 , with an A1c of 8.4%  Topamax  caused itching and rash  SUBJECTIVE:   During the last visit (05/06/2023): A1c 8.3%     Today (09/25/23): Krista Dawson is here for follow-up on diabetes management.  She checks her blood sugars multiple times daily. The patient has not had hypoglycemic episodes since the last clinic visit.  She continues to follow with GI for MASLD ([facility]), will have fiberscan , had a referral to nutrition and  weight management program , but [facility] may end up being out of network for her insurance  She continues to follow up with behavioral health for anxiety, she was also seen by psychiatry, Wellbutrin  was added, mentally she is feeling much better   No nausea NO constipation or diarrhea    HOME DIABETES REGIMEN: Metformin  500 mg XR  2 tabs BID  Glimepiride  2 mg daily  Farxiga  10 mg daily  Toujeo  16 units daily daily      Statin: yes ACE-I/ARB: yes    CONTINUOUS GLUCOSE MONITORING RECORD INTERPRETATION    Dates of Recording: 9/3-9/16/2025  Sensor description:dexcom  Results statistics:   CGM use % of time 93  Average and SD 161/49  Time in range 69 %  % Time Above 180 26  % Time above 250 5  % Time Below target 0   Glycemic patterns summary: BGs are within optimal range overnight and fluctuate during the day Hyperglycemic episodes postprandial  Hypoglycemic episodes occurred rarely  Overnight periods: Mostly optimal    DIABETIC COMPLICATIONS: Microvascular complications:  cataract Denies: CKD , retinopathy, neuropathy  Last eye exam: Completed 05/2021  Macrovascular complications:   Denies: CAD, PVD, CVA   PAST HISTORY: Past Medical History:  Past Medical History:  Diagnosis Date   Allergy    Asthma    exercise induced - not used inhaler couple of yrs pt reported 10-03-2018   Blood transfusion without reported diagnosis    Colon polyp  Complication of anesthesia    went into Vtach after breast reduction surgery 1988/89   Depression    DM (diabetes mellitus) (HCC)    GERD (gastroesophageal reflux disease)    HTN (hypertension)    Hyperlipidemia    Hypothyroidism    NASH (nonalcoholic steatohepatitis)    Obesity (BMI 30.0-34.9)    Post menopausal problems    Sleep apnea    no c-pap   Transaminitis    Vitamin D  deficiency    Past Surgical History:  Past Surgical History:  Procedure Laterality Date   ADRENALECTOMY Right    APPENDECTOMY      BIOPSY  12/12/2020   Procedure: BIOPSY;  Surgeon: Wilhelmenia Aloha Raddle., MD;  Location: [facility];  Service: Gastroenterology;;   BREAST REDUCTION SURGERY     CHOLECYSTECTOMY     ESOPHAGEAL MANOMETRY N/A 12/05/2022   Procedure: ESOPHAGEAL MANOMETRY (EM);  Surgeon: Albertus Gordy HERO, MD;  Location: [facility];  Service: Gastroenterology;  Laterality: N/A;   ESOPHAGOGASTRODUODENOSCOPY N/A 12/12/2020   Procedure: ESOPHAGOGASTRODUODENOSCOPY (EGD);  Surgeon: Wilhelmenia Aloha Raddle., MD;  Location: [facility];  Service: Gastroenterology;  Laterality: N/A;   EUS N/A 12/12/2020   Procedure: UPPER ENDOSCOPIC ULTRASOUND (EUS) LINEAR;  Surgeon: Wilhelmenia Aloha Raddle., MD;  Location: [facility];  Service: Gastroenterology;  Laterality: N/A;   HYSTERECTOMY ABDOMINAL WITH SALPINGECTOMY     LIVER BIOPSY  2008, 2013   NASH   TONSILLECTOMY     UPPER GASTROINTESTINAL ENDOSCOPY      Social History:  reports that she quit smoking about 16 years ago. Her smoking use included cigarettes. She has never used smokeless tobacco. She reports current alcohol use. She reports that she does not use drugs. Family History:  Family History  Problem Relation Age of Onset   Depression Mother    Breast cancer Mother    Colon polyps Mother    Stroke Mother 66   GER disease Father    Diabetes Father    Kidney disease Father    CAD Father    Other Father        cause of death listed as resp. failure   Stroke Sister 21   Colon polyps Sister    Pancreatic cancer Maternal Uncle    Pancreatic cancer Paternal Uncle    Diabetes Maternal Grandfather    Heart disease Maternal Grandfather    Depression Maternal Grandmother    CAD Paternal Grandfather    Diabetes Paternal Grandmother    Colon cancer Neg Hx    Esophageal cancer Neg Hx    Stomach cancer Neg Hx    Rectal cancer Neg Hx      HOME MEDICATIONS: Allergies as of 09/25/2023       Reactions   Trulicity  [dulaglutide ]    Pancreatitis   Spironolactone  Hives, Swelling   Paxil [paroxetine Hcl] Other (See Comments)   Other reaction(s): Other (comments) WORSENED DEPRESSION. WORSENED DEPRESSION.   Sulfa Antibiotics Hives, Itching   Codeine Itching, Rash        Medication List        Accurate as of September 25, 2023  9:17 AM. If you have any questions, ask your nurse or doctor.          STOP taking these medications    topiramate  100 MG tablet Commonly known as: Topamax  Stopped by: Donell PARAS Nikolas Casher       TAKE these medications    ALPRAZolam  0.5 MG tablet Commonly known as: Xanax  Take 0.5-1 tablets (0.25-0.5 mg total) by  mouth 3 (three) times daily as needed for anxiety.   aspirin EC 81 MG tablet Take 81 mg by mouth every evening.   buPROPion  150 MG 24 hr tablet Commonly known as: WELLBUTRIN  XL Take 1 tablet (150 mg total) by mouth daily.   CAL-MAG PO Take by mouth.   Contour Next Test test strip Generic drug: glucose blood Check blood sugar 1 time daily   Dexcom G7 Sensor Misc CHANGE SENSORS EVERY 10    DAYS   Farxiga  10 MG Tabs tablet Generic drug: dapagliflozin  propanediol TAKE 1 TABLET DAILY   glimepiride  4 MG tablet Commonly known as: AMARYL  Take 1 tablet (4 mg total) by mouth daily before breakfast. What changed:  medication strength how much to take Changed by: Ahnyla Mendel J Lavana Huckeba   hydrochlorothiazide  25 MG tablet Commonly known as: HYDRODIURIL  TAKE 1 TABLET DAILY   Insulin  Pen Needle 32G X 4 MM Misc 1 Device by Does not apply route daily in the afternoon.   lisinopril  20 MG tablet Commonly known as: ZESTRIL  TAKE 1 TABLET DAILY   magnesium chloride 64 MG Tbec SR tablet Commonly known as: SLOW-MAG Take 1 tablet by mouth daily.   Melatonin 10 MG Caps Take 10 mg by mouth at bedtime.   metFORMIN  500 MG 24 hr tablet Commonly known as: GLUCOPHAGE -XR Take 2 tablets (1,000 mg total) by mouth 2 (two) times daily with a meal.   Microlet Lancets Misc by Does not apply route. Test  3-4 times daily (Micorlet Colored Lancets)   OMEGA 3 500 PO Take 1,000 mg by mouth.   pantoprazole  40 MG tablet Commonly known as: PROTONIX  Take 1 tablet (40 mg total) by mouth 2 (two) times daily before a meal.   Rezdiffra 80 MG Tabs Generic drug: Resmetirom Take 1 tablet by mouth daily.   rosuvastatin  10 MG tablet Commonly known as: CRESTOR  TAKE 1 TABLET DAILY   Synthroid  88 MCG tablet Generic drug: levothyroxine  TAKE 1 TABLET DAILY BEFORE BREAKFAST   Toujeo  Max SoloStar 300 UNIT/ML Solostar Pen Generic drug: insulin  glargine (2 Unit Dial) Inject 16 Units into the skin at bedtime.   triamcinolone  cream 0.1 % Commonly known as: KENALOG  Apply 1 application. topically 2 (two) times daily.   venlafaxine  XR 150 MG 24 hr capsule Commonly known as: EFFEXOR -XR Take 1 capsule (150 mg total) by mouth daily with breakfast.   Vitamin D3 50 MCG (2000 UT) Tabs Take 2,000 Units by mouth in the morning.         ALLERGIES: Allergies  Allergen Reactions   Trulicity  [Dulaglutide ]     Pancreatitis    Spironolactone Hives and Swelling   Paxil [Paroxetine Hcl] Other (See Comments)    Other reaction(s): Other (comments) WORSENED DEPRESSION. WORSENED DEPRESSION.    Sulfa Antibiotics Hives and Itching   Codeine Itching and Rash     REVIEW OF SYSTEMS: A comprehensive ROS was conducted with the patient and is negative except as per HPI    OBJECTIVE:   VITAL SIGNS: BP 120/80 (BP Location: Left Arm, Patient Position: Sitting, Cuff Size: Normal)   Pulse 87   Ht 5' 1 (1.549 m)   Wt 206 lb (93.4 kg)   SpO2 97%   BMI 38.92 kg/m    PHYSICAL EXAM:  General: Pt appears well and is in NAD  Neck:  Thyroid : Thyroid  size normal.  No goiter or nodules appreciated.   Lungs: Clear with good BS bilat   Heart: RRR   Extremities:  Lower extremities - No  pretibial edema.   Neuro: MS is good with appropriate affect, pt is alert and Ox3    DM foot exam: 09/25/2023  The skin of  the feet is intact without sores or ulcerations. The pedal pulses are 2+ on right and 2+ on left. The sensation is intact to a screening 5.07, 10 gram monofilament bilaterally     DATA REVIEWED:  Lab Results  Component Value Date   HGBA1C 8.0 (A) 09/25/2023   HGBA1C 8.3 (A) 05/06/2023   HGBA1C 8.4 (A) 01/29/2023   Labs @ careeverywhere 07/15/2023  BUN 27 Creatinine 1.1 Glucose 162 GFR 59 LDL 66 HDL 43 Triglycerides 140      Latest Reference Range & Units 02/19/23 10:32  Sodium 135 - 145 mEq/L 137  Potassium 3.5 - 5.1 mEq/L 4.3  Chloride 96 - 112 mEq/L 106  CO2 19 - 32 mEq/L 22  Glucose 70 - 99 mg/dL 816 (H)  BUN 6 - 23 mg/dL 25 (H)  Creatinine 9.59 - 1.20 mg/dL 8.97  Calcium  8.4 - 10.5 mg/dL 89.7  Alkaline Phosphatase 39 - 117 U/L 80  Albumin 3.5 - 5.2 g/dL 4.5  AST 0 - 37 U/L 41 (H)  ALT 0 - 35 U/L 56 (H)  Total Protein 6.0 - 8.3 g/dL 7.3  Total Bilirubin 0.2 - 1.2 mg/dL 0.5  GFR >39.99 mL/min 61.84    Latest Reference Range & Units 02/19/23 10:32  Creatinine,U mg/dL 51.6  Microalb, Ur 0.0 - 1.9 mg/dL 1.0  MICROALB/CREAT RATIO 0.0 - 30.0 mg/g 19.9     ASSESSMENT / PLAN / RECOMMENDATIONS:   1) Type 2 Diabetes Mellitus, With improving glycemic control, Without  complications - Most recent A1c of 8.0%. Goal A1c < 7.0 %.     -A1c continues to be above goal but is trending down -Patient not a candidate for GLP-1 agonist nor DPP 4 inhibitors due to history of pancreatitis while on Trulicity  -GAD-65 and islet cell antibody negative  - I have switched glipizide  to glimepiride  as she has not been taking glipizide  twice daily - Patient continues with postprandial hyperglycemia, we again emphasized the importance of taking glimepiride  15-20 minutes before the first meal of the day to prevent hypoglycemia. - I will decrease Toujeo  as below  MEDICATIONS: Decrease 12 Toujeo  12 units daily Increase glimepiride  4 mg, 1 tablet daily Continue metformin  500 mg XR 2 tabs  BID Continue Farxiga  10 mg daily  EDUCATION / INSTRUCTIONS: BG monitoring instructions: Patient is instructed to check her blood sugars  3 times a day, before meals . Call [facility] if: BG persistently < 70  I reviewed the Rule of 15 for the treatment of hypoglycemia in detail with the patient. Literature supplied.   2) Diabetic complications:  Eye: Does not have known diabetic retinopathy.  Neuro/ Feet: Does not have known diabetic peripheral neuropathy. Renal: Patient does not have known baseline CKD. She is  on an ACEI/ARB at present.   3) Hypothyroidism:   - Patient is clinically euthyroid - No changes  Medication Continue levothyroxine  88 mcg daily  4) Dyslipidemia:  -LDL has been at goal - No changes   Medication  Continue rosuvastatin  20 mg daily    Follow-up in 4 months   Signed electronically by: Stefano Redgie Butts, MD  [facility]  [facility] [address] [place] [place], [place] [address] Phone: [phone] FAX: [phone]   CC: Theophilus Andrews, Tully GRADE, MD [address] [place] [address] Phone:  [phone]  Fax: [phone]    Return to Endocrinology clinic as below: Future Appointments  Date Time Provider Department Center  10/03/2023  5:00 PM Cottle, Peggye MATSU, LCSW [facility] None  10/18/2023 12:00 PM Cottle, Bambi G, LCSW [facility] None  10/25/2023  9:30 AM Rhys Boyer T, PA-C [facility] None  11/01/2023  2:00 PM Cottle, Bambi G, LCSW [facility] None

## 2023-09-26 ENCOUNTER — Encounter: Payer: Self-pay | Admitting: Internal Medicine

## 2023-10-03 ENCOUNTER — Ambulatory Visit: Admitting: Psychology

## 2023-10-03 DIAGNOSIS — F4323 Adjustment disorder with mixed anxiety and depressed mood: Secondary | ICD-10-CM | POA: Diagnosis not present

## 2023-10-03 DIAGNOSIS — F411 Generalized anxiety disorder: Secondary | ICD-10-CM | POA: Diagnosis not present

## 2023-10-03 DIAGNOSIS — F331 Major depressive disorder, recurrent, moderate: Secondary | ICD-10-CM | POA: Diagnosis not present

## 2023-10-06 NOTE — Progress Notes (Signed)
 [facility] Counselor/Therapist Progress Note  Patient ID: Krista Dawson, MRN: [id number],    Date:10/03/2023  Time Spent: 60 minutes  Time In:  5:00  Time Out:  6:00  Treatment Type: Individual Therapy  Reported Symptoms: sadness,anxiety  Mental Status Exam: Appearance:  Casual     Behavior: Appropriate  Motor: Normal  Speech/Language:  Clear and Coherent  Affect: Blunt  Mood: pleasant  Thought process: normal  Thought content:   WNL  Sensory/Perceptual disturbances:   WNL  Orientation: oriented to person, place, time/date, and situation  Attention: Good  Concentration: Good  Memory: WNL  Fund of knowledge:  Good  Insight:   Good  Judgment:  Good  Impulse Control: Good   Risk Assessment: Danger to Self:  No Self-injurious Behavior: No Danger to Others: No Duty to Warn:no Physical Aggression / Violence:No  Access to Firearms a concern: No  Gang Involvement:No   Subjective: The patient attended a face-to-face individual therapy session in the office today.  The patient reports that she is doing very well.  She says that her medication is working well.  She does present as pleasant and cooperative today and does not seem as depressed.  We talked about what she wants to continue to work on and she feels like she like to continue to do therapy at least until I retire.  And she is supposed to think about whether she would like to go to someone else when I retire.  The patient reports that she feels like she is doing better with the medicine and the coping strategies that she has learned in therapy.  I recommended that she go through her notes to see if there is some other things that we need to clarify just to make sure that we have her in good shape. Interventions: Cognitive Behavioral Therapy, Assertiveness/Communication, and Insight-Oriented  Diagnosis:Major depressive disorder, recurrent episode, moderate (HCC)  Generalized anxiety  disorder  Adjustment disorder with mixed anxiety and depressed mood  Plan: Client Abilities/Strengths  Intelligent, caring, insightful  Client Treatment Preferences  Outpatient individual therapy  Client Statement of Needs  I need help to deal with all of the stressors going on that are contributing to my depression  Treatment Level  Outpatient Individual therapy  Symptoms  Decrease or loss of appetite.: No Description Entered (Status: maintained). Depressed or irritable  mood.: No Description Entered (Status: maintained). Lack of energy.: No Description Entered (Status:  maintained).  Problems Addressed  Unipolar Depression, Unipolar Depression  Goals 1. Develop healthy thinking patterns and beliefs about self, others, and the world that lead to the alleviation and help prevent the relapse of  depression. Objective Learn and implement behavioral strategies to overcome depression. Target Date: 12/12/2023 Frequency: biWeekly Progress: 50 Modality: individual  Related Interventions 1. Assist the client in developing skills that increase the likelihood of deriving pleasure from  behavioral activation (e.g., assertiveness skills, developing an exercise plan, less internal/more  external focus, increased social involvement); reinforce success. Objective Learn and implement problem-solving and decision-making skills. Target Date: 12/12/2023 Frequency: biWeekly Progress: 40 Modality: individual Objective Verbalize an understanding and resolution of current interpersonal problems. Target Date: 12/12/2023 Frequency: biWeekly Progress: 30 Modality: individual Related Interventions 1. For interpersonal disputes, help the client explore the relationship, the nature of the dispute,  whether it has reached an impasse, and available options to resolve it including learning and  implementing conflict-resolution skills; if the relationship has reached an impasse, consider  ways to change  the impasse or to  end the relationship. Objective Identify and replace thoughts and beliefs that support depression. Target Date: 12/12/2023 Frequency: biWeekly Progress: 50 Modality: individual Related Interventions 1. Facilitate and reinforce the client's shift from biased depressive self-talk and beliefs to realitybased cognitive messages that enhance self-confidence and increase adaptive actions (see  Positive Self-Talk in the Adult Psychotherapy Homework Planner by Jenniffer). Objective Describe current and past experiences with depression including their impact on functioning and  attempts to resolve it. Target Date: 12/12/2023 Frequency: biWeekly Progress: 30 Modality: individual  Related Interventions 1. Encourage the client to share his/her thoughts and feelings of depression; express empathy and  build rapport while identifying primary cognitive, behavioral, interpersonal, or other  contributors to depression. 2. Recognize, accept, and cope with feelings of depression. Diagnosis F43.23  Adjustment Disorder with mixed anxiety and depression  Major Depressive disorder, recurrent, moderate Conditions For Discharge Achievement of treatment goals and objectives  Krista Padron G Jataya Wann, LCSW

## 2023-10-18 ENCOUNTER — Ambulatory Visit: Admitting: Psychology

## 2023-10-25 ENCOUNTER — Ambulatory Visit: Admitting: Physician Assistant

## 2023-10-25 ENCOUNTER — Encounter: Payer: Self-pay | Admitting: Physician Assistant

## 2023-10-25 DIAGNOSIS — F3341 Major depressive disorder, recurrent, in partial remission: Secondary | ICD-10-CM

## 2023-10-25 DIAGNOSIS — F411 Generalized anxiety disorder: Secondary | ICD-10-CM

## 2023-10-25 MED ORDER — VENLAFAXINE HCL ER 150 MG PO CP24: 150.0000 mg | ORAL_CAPSULE | Freq: Every day | ORAL | 1 refills | Status: AC

## 2023-10-25 MED ORDER — BUPROPION HCL ER (XL) 150 MG PO TB24: 150.0000 mg | ORAL_TABLET | Freq: Every day | ORAL | 1 refills | Status: DC

## 2023-10-25 MED ORDER — ALPRAZOLAM 0.5 MG PO TABS: 0.2500 mg | ORAL_TABLET | Freq: Three times a day (TID) | ORAL | 1 refills | Status: AC | PRN

## 2023-10-25 NOTE — Progress Notes (Unsigned)
 [facility]  Patient ID: Krista Dawson,  MRN: [id number]  PCP: Theophilus Andrews, Tully GRADE, MD  Date of Evaluation: 10/25/2023 Time spent:25 minutes  Chief Complaint:  Chief Complaint   Depression; Anxiety; Follow-up    HISTORY/CURRENT STATUS: HPI For routine med check.  Focus has gotten better and feels more energy since our LOV 6 weeks ago.  The Wellbutrin  has helped that.  Has had some work stress w/ a co-worker but hopefully got it worked out yesterday after the two of them talked.  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 I feel like they're against me though which puts me in a fight or flight mode. She hasn't taken a Xanax  in those situations and looking back, she realizes she should have.  Appetite is normal and weight is stable.  ADLs and personal hygiene are normal.  No reports of increased energy with decreased need for sleep, increased talkativeness, racing thoughts, impulsivity or risky behaviors, increased spending, increased libido, grandiosity, increased irritability or anger, paranoia, or hallucinations.  Individual Medical History/ Review of Systems: Changes? :No   Past medications for mental health diagnoses include: Paxil, Prozac, Wellbutrin , lexapro, Effexor  started in early 2019 after hysterectomy for hot flashes and mood Ambien, Xanax   No mental health hospitalizations No suicide attempts Allergies: Trulicity  [dulaglutide ], Spironolactone, Paxil [paroxetine hcl], Sulfa antibiotics, and Codeine  Current Medications:  Current Outpatient Medications:    aspirin EC 81 MG tablet, Take 81 mg by mouth every evening., Disp: , Rfl:    Calcium -Magnesium (CAL-MAG PO), Take by mouth., Disp: , Rfl:    Cholecalciferol (VITAMIN D3) 50 MCG (2000 UT) TABS, Take 2,000 Units by mouth in the morning., Disp: , Rfl:    Continuous Glucose Sensor (DEXCOM G7 SENSOR) MISC, CHANGE SENSORS EVERY 10    DAYS, Disp: 9 each, Rfl: 3   dapagliflozin  propanediol (FARXIGA ) 10 MG  TABS tablet, TAKE 1 TABLET DAILY, Disp: 90 tablet, Rfl: 0   glimepiride  (AMARYL ) 4 MG tablet, Take 1 tablet (4 mg total) by mouth daily before breakfast., Disp: 90 tablet, Rfl: 3   glucose blood (CONTOUR NEXT TEST) test strip, Check blood sugar 1 time daily, Disp: 100 each, Rfl: 12   hydrochlorothiazide  (HYDRODIURIL ) 25 MG tablet, TAKE 1 TABLET DAILY, Disp: 90 tablet, Rfl: 1   insulin  glargine, 2 Unit Dial, (TOUJEO  MAX SOLOSTAR) 300 UNIT/ML Solostar Pen, Inject 12 Units into the skin at bedtime., Disp: 15 mL, Rfl: 3   Insulin  Pen Needle 32G X 4 MM MISC, 1 Device by Does not apply route daily in the afternoon., Disp: 100 each, Rfl: 4   levothyroxine  (SYNTHROID ) 88 MCG tablet, TAKE 1 TABLET DAILY BEFORE BREAKFAST, Disp: 90 tablet, Rfl: 1   lisinopril  (ZESTRIL ) 20 MG tablet, TAKE 1 TABLET DAILY, Disp: 90 tablet, Rfl: 1   magnesium chloride (SLOW-MAG) 64 MG TBEC SR tablet, Take 1 tablet by mouth daily., Disp: , Rfl:    Melatonin 10 MG CAPS, Take 10 mg by mouth at bedtime., Disp: , Rfl:    metFORMIN  (GLUCOPHAGE -XR) 500 MG 24 hr tablet, Take 2 tablets (1,000 mg total) by mouth 2 (two) times daily with a meal., Disp: 360 tablet, Rfl: 3   Microlet Lancets MISC, by Does not apply route. Test 3-4 times daily (Micorlet Colored Lancets), Disp: , Rfl:    Omega-3 Fatty Acids (OMEGA 3 500 PO), Take 1,000 mg by mouth., Disp: , Rfl:    pantoprazole  (PROTONIX ) 40 MG tablet, Take 1 tablet (40 mg total) by mouth 2 (two) times  daily before a meal., Disp: 180 tablet, Rfl: 3   REZDIFFRA 80 MG TABS, Take 1 tablet by mouth daily., Disp: , Rfl:    rosuvastatin  (CRESTOR ) 10 MG tablet, TAKE 1 TABLET DAILY, Disp: 90 tablet, Rfl: 1   ALPRAZolam  (XANAX ) 0.5 MG tablet, Take 0.5-1 tablets (0.25-0.5 mg total) by mouth 3 (three) times daily as needed for anxiety., Disp: 60 tablet, Rfl: 1   buPROPion  (WELLBUTRIN  XL) 150 MG 24 hr tablet, Take 1 tablet (150 mg total) by mouth daily., Disp: 90 tablet, Rfl: 1   venlafaxine  XR  (EFFEXOR -XR) 150 MG 24 hr capsule, Take 1 capsule (150 mg total) by mouth daily with breakfast., Disp: 90 capsule, Rfl: 1 Medication Side Effects: none  Family Medical/ Social History: Changes? No  MENTAL HEALTH EXAM:  There were no vitals taken for this visit.There is no height or weight on file to calculate BMI.  General Appearance: Casual and Well Groomed  Eye Contact:  Good  Speech:  Clear and Coherent and Normal Rate  Volume:  Normal  Mood:  Euthymic  Affect:  Congruent  Thought Process:  Goal Directed and Descriptions of Associations: Circumstantial  Orientation:  Full (Time, Place, and Person)  Thought Content: Logical   Suicidal Thoughts:  No  Homicidal Thoughts:  No  Memory:  WNL  Judgement:  Good  Insight:  Good  Psychomotor Activity:  Normal  Concentration:  Concentration: Good and Attention Span: Good  Recall:  Good  Fund of Knowledge: Good  Language: Good  Assets:  Communication Skills Desire for Improvement Financial Resources/Insurance Housing Resilience Transportation Vocational/Educational  ADL's:  Intact  Cognition: WNL  Prognosis:  Good   DIAGNOSES:    ICD-10-CM   1. Recurrent major depression in partial remission  F33.41     2. Generalized anxiety disorder  F41.1       Receiving Psychotherapy: Yes   with Bambi Cottle, LCSW  RECOMMENDATIONS:   PDMP reviewed.  Xanax  filled 06/10/2023. I provided approximately 25 minutes of face to face time during this encounter, including time spent before and after the visit in records review, medical decision making, counseling pertinent to today's visit, and charting.   Will make no changes now since the circumstances are likely causing the mood changes.  But will consider increasing wellbutrin  between visits if needed. Encouraged her to take the Xanax  when needed.  Continue Xanax  0.5 mg, 1/2-1 p.o. 3 times daily as needed anxiety. Continue  Wellbutrin  XL 150 mg, 1 q am.  Continue Effexor  XR 150 mg daily.   Continue therapy with Bambi Cottle LCSW. Return in 2 months.   Verneita Cooks, PA-C

## 2023-11-01 ENCOUNTER — Ambulatory Visit: Admitting: Psychology

## 2023-11-05 ENCOUNTER — Ambulatory Visit (INDEPENDENT_AMBULATORY_CARE_PROVIDER_SITE_OTHER): Admitting: Psychology

## 2023-11-05 DIAGNOSIS — F411 Generalized anxiety disorder: Secondary | ICD-10-CM

## 2023-11-05 DIAGNOSIS — F3341 Major depressive disorder, recurrent, in partial remission: Secondary | ICD-10-CM

## 2023-11-05 DIAGNOSIS — F331 Major depressive disorder, recurrent, moderate: Secondary | ICD-10-CM

## 2023-11-05 DIAGNOSIS — F4323 Adjustment disorder with mixed anxiety and depressed mood: Secondary | ICD-10-CM | POA: Diagnosis not present

## 2023-11-06 NOTE — Progress Notes (Signed)
 [facility] Counselor/Therapist Progress Note  Patient ID: Krista Dawson, MRN: [id number],    Date:11/05/2023  Time Spent: 60 minutes  Time In:  5:00  Time Out:  6:00  Treatment Type: Individual Therapy  Reported Symptoms: sadness,anxiety  Mental Status Exam: Appearance:  Casual     Behavior: Appropriate  Motor: Normal  Speech/Language:  Clear and Coherent  Affect: Blunt  Mood: pleasant  Thought process: normal  Thought content:   WNL  Sensory/Perceptual disturbances:   WNL  Orientation: oriented to person, place, time/date, and situation  Attention: Good  Concentration: Good  Memory: WNL  Fund of knowledge:  Good  Insight:   Good  Judgment:  Good  Impulse Control: Good   Risk Assessment: Danger to Self:  No Self-injurious Behavior: No Danger to Others: No Duty to Warn:no Physical Aggression / Violence:No  Access to Firearms a concern: No  Gang Involvement:No   Subjective: The patient attended a face-to-face individual therapy session in the office today.  The patient presented as very anxious and sad today.  She was very different today than she has been in the recent past.  We talked about what was happening and it appears that there have been some situations at work that have caused her a great deal of stress and she feels threatened by her teammates and does not feel like it is a good work environment.  She shared with me an email that a colleague had sent her and it seemed that she was being reactive to the email.  The email did not seem very threatening at all to me and I explained to her that I thought maybe what was happening was she was being reactive from something that had happened in the past.  We talked about doing EMDR so that we can desensitize her to situations that would make her feel unsafe.  We did a neural network map today and it seems that the negative cognition is I am unsafe.  We were able to identify 2 events to target with EMDR  and I do hope that this will help her be able to navigate that situation in a more healthy way.  We also talked about the need for her to continue to get her resume together as she has not made any progress on that yet.  Interventions: Cognitive Behavioral Therapy, Assertiveness/Communication, and Insight-Oriented, EMDR  Diagnosis:No diagnosis found.  Plan: Client Abilities/Strengths  Intelligent, caring, insightful  Client Treatment Preferences  Outpatient individual therapy  Client Statement of Needs  I need help to deal with all of the stressors going on that are contributing to my depression  Treatment Level  Outpatient Individual therapy  Symptoms  Decrease or loss of appetite.: No Description Entered (Status: maintained). Depressed or irritable  mood.: No Description Entered (Status: maintained). Lack of energy.: No Description Entered (Status:  maintained).  Problems Addressed  Unipolar Depression, Unipolar Depression  Goals 1. Develop healthy thinking patterns and beliefs about self, others, and the world that lead to the alleviation and help prevent the relapse of  depression. Objective Learn and implement behavioral strategies to overcome depression. Target Date: 12/11/2024 Frequency: biWeekly Progress: 50 Modality: individual  Related Interventions 1. Assist the client in developing skills that increase the likelihood of deriving pleasure from  behavioral activation (e.g., assertiveness skills, developing an exercise plan, less internal/more  external focus, increased social involvement); reinforce success. Objective Learn and implement problem-solving and decision-making skills. Target Date: 12/04/2026Frequency: biWeekly Progress: 40 Modality: individual Objective Verbalize  an understanding and resolution of current interpersonal problems. Target Date: 12/11/2024 Frequency: biWeekly Progress: 30 Modality: individual Related Interventions 1. For interpersonal  disputes, help the client explore the relationship, the nature of the dispute,  whether it has reached an impasse, and available options to resolve it including learning and  implementing conflict-resolution skills; if the relationship has reached an impasse, consider  ways to change the impasse or to end the relationship. Objective Identify and replace thoughts and beliefs that support depression. Target Date: 12/11/2024 Frequency: biWeekly Progress: 50 Modality: individual Related Interventions 1. Facilitate and reinforce the client's shift from biased depressive self-talk and beliefs to realitybased cognitive messages that enhance self-confidence and increase adaptive actions (see  Positive Self-Talk in the Adult Psychotherapy Homework Planner by Jenniffer). Objective Describe current and past experiences with depression including their impact on functioning and  attempts to resolve it. Target Date: 12/11/2024 Frequency: biWeekly Progress: 30 Modality: individual  Related Interventions 1. Encourage the client to share his/her thoughts and feelings of depression; express empathy and  build rapport while identifying primary cognitive, behavioral, interpersonal, or other  contributors to depression. 2. Recognize, accept, and cope with feelings of depression. 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

## 2023-11-08 ENCOUNTER — Telehealth: Payer: Self-pay | Admitting: Physician Assistant

## 2023-11-08 ENCOUNTER — Ambulatory Visit: Payer: Self-pay

## 2023-11-08 ENCOUNTER — Ambulatory Visit: Admission: EM | Admit: 2023-11-08 | Discharge: 2023-11-08 | Disposition: A | Discharge status: Home / Self Care

## 2023-11-08 DIAGNOSIS — H00014 Hordeolum externum left upper eyelid: Secondary | ICD-10-CM

## 2023-11-08 NOTE — Discharge Instructions (Addendum)
 It appears that you have developed a stye (hordeolum) on your left upper  eyelid  These are easily treated with at-home measures and generally resolve over 1-2 weeks  Use a warm compress over the eye for 5-10 minutes at a time, 2-4 times per day to help drainage Use a gentle solution of diluted baby wash and water to wash your eyelids and eyelashes at night to prevent buildup of skin and dried tears You can use artificial tears and lubricating eye drops to help with irritation to your eye  Options include Blink tears and Refresh   If the swelling persists or increases in size to encompass the entire eye area, you develop a fever, vision changes or copious amounts of drainage that is yellow-green.

## 2023-11-08 NOTE — Telephone Encounter (Signed)
 LVM to [facility]

## 2023-11-08 NOTE — Telephone Encounter (Signed)
 FYI Only or Action Required?: FYI only for provider: no sdv, proceeding to urgent care.  Patient was last seen in primary care on 02/19/2023 by Theophilus Andrews, Tully GRADE, MD.  Called Nurse Triage reporting Eye Problem.  Symptoms began several days ago.  Interventions attempted: Nothing.  Symptoms are: gradually worsening.  Triage Disposition: See HCP Within 4 Hours (Or PCP Triage)  Patient/caregiver understands and will follow disposition?: Yes   Copied from CRM #[id number]. Topic: Clinical - Red Word Triage >> Nov 08, 2023  3:02 PM Ashley R wrote: Red Word that prompted transfer to Nurse Triage: swelling on face   ----------------------------------------------------------------------- From previous Reason for Contact - Scheduling: From previous Reason for Contact - Red Word Triage: Red Word that prompted transfer to Nurse Triage: Eyelid swelling, red around edges, soreness Reason for Disposition  [1] SEVERE eyelid swelling on one side AND [2] red and painful (or tender to touch)  Answer Assessment - Initial Assessment Questions Additional info: No sdv available, proceeding to urgent care.    1. ONSET: When did the swelling start? (e.g., minutes, hours, days)     Couple days  2. LOCATION: What part of the eyelid is swollen?     Upper lid 3. SEVERITY: How swollen is it? (e.g., describe; mild, moderate, or severe)    Mild but increasing 4. ITCHING: Is there any itching? If Yes, ask: How much?   (Scale 1-10; mild, moderate or severe)     no 5. PAIN: Is the swelling painful to touch? If Yes, ask: How painful is it?   (Scale 1-10; mild, moderate or severe)     Tender to touch, sore otherwise stated not painful but sore 6. FEVER: Do you have a fever? If Yes, ask: What is it, how was it measured, and when did it start?      Denies  7. CAUSE: What do you think is causing the swelling?     Unsure  8. RECURRENT SYMPTOM: Have you had eyelid swelling before?  If Yes, ask: When was the last time? What happened that time?     no 9. OTHER SYMPTOMS: Do you have any other symptoms? (e.g., blurred vision, eye discharge, rash, runny nose)     Eye lid redness  Protocols used: Eyelid Swelling-A-AH

## 2023-11-08 NOTE — ED Provider Notes (Addendum)
 [facility]    CSN: [id number] Arrival date & time: 11/08/23  1533      History   Chief Complaint Chief Complaint  Patient presents with   Eye Problem    HPI Krista Dawson is a 58 y.o. female.  has a past medical history of Allergy, Asthma, Blood transfusion without reported diagnosis, Colon polyp, Complication of anesthesia, Depression, DM (diabetes mellitus) (HCC), GERD (gastroesophageal reflux disease), HTN (hypertension), Hyperlipidemia, Hypothyroidism, NASH (nonalcoholic steatohepatitis), Obesity (BMI 30.0-34.9), Post menopausal problems, Sleep apnea, Transaminitis, and Vitamin D  deficiency.   HPI  Discussed the use of AI scribe software for clinical note transcription with the patient, who gave verbal consent to proceed.  The patient presents with a swollen left eyelid.  The left eyelid swelling began last night and has progressively worsened, becoming tender to the touch, especially in the center. There is no discharge, only the usual 'morning eye boogers'. The eyelid is not itchy, although it occasionally feels itchy. No vision changes, sensitivity to light, or redness of the eye itself. The swelling causes the eye to appear more closed than usual.  There is a sensation as if something is under the eyelid, but she is unsure if this is due to the swelling. She does not wear contact lenses, only glasses. To alleviate symptoms, she has been applying warm compresses since the symptoms became more noticeable last night and this morning.  She experiences workplace stress and toxicity, which has led her to see a therapist since November and a psychiatrist for medication. She has been employed at her current job for twenty-four and a half years and is considering leaving due to the stress.   Past Medical History:  Diagnosis Date   Allergy    Asthma    exercise induced - not used inhaler couple of yrs pt reported 10-03-2018   Blood transfusion without reported  diagnosis    Colon polyp    Complication of anesthesia    went into Vtach after breast reduction surgery 1988/89   Depression    DM (diabetes mellitus) (HCC)    GERD (gastroesophageal reflux disease)    HTN (hypertension)    Hyperlipidemia    Hypothyroidism    NASH (nonalcoholic steatohepatitis)    Obesity (BMI 30.0-34.9)    Post menopausal problems    Sleep apnea    no c-pap   Transaminitis    Vitamin D  deficiency     Patient Active Problem List   Diagnosis Date Noted   Dysphagia 01/04/2023   Gastroesophageal reflux disease 01/04/2023   Atypical chest pain 01/04/2023   Long term (current) use of oral hypoglycemic drugs 09/18/2022   Dyslipidemia 09/18/2022   Type 2 diabetes mellitus with hyperglycemia, without long-term current use of insulin  (HCC) 11/03/2021   Family history of breast cancer in mother 07/15/2020   HTN (hypertension)    DM (diabetes mellitus) (HCC)    Hyperlipidemia    Hypothyroidism    NASH (nonalcoholic steatohepatitis)    Transaminitis    Obesity (BMI 30.0-34.9)    Vitamin D  deficiency    Post menopausal problems    Family history of diabetes mellitus (DM) 03/09/2015   Family history of heart disease 03/09/2015   Family history of kidney disease 03/09/2015   Family history of obesity 03/09/2015   Family history of pancreatic cancer 03/09/2015   Fatty liver 07/25/2007    Past Surgical History:  Procedure Laterality Date   ADRENALECTOMY Right    APPENDECTOMY     BIOPSY  12/12/2020   Procedure: BIOPSY;  Surgeon: Wilhelmenia Aloha Raddle., MD;  Location: [facility];  Service: Gastroenterology;;   BREAST REDUCTION SURGERY     CHOLECYSTECTOMY     ESOPHAGEAL MANOMETRY N/A 12/05/2022   Procedure: ESOPHAGEAL MANOMETRY (EM);  Surgeon: Albertus Gordy HERO, MD;  Location: [facility];  Service: Gastroenterology;  Laterality: N/A;   ESOPHAGOGASTRODUODENOSCOPY N/A 12/12/2020   Procedure: ESOPHAGOGASTRODUODENOSCOPY (EGD);  Surgeon: Wilhelmenia Aloha Raddle., MD;   Location: [facility];  Service: Gastroenterology;  Laterality: N/A;   EUS N/A 12/12/2020   Procedure: UPPER ENDOSCOPIC ULTRASOUND (EUS) LINEAR;  Surgeon: Wilhelmenia Aloha Raddle., MD;  Location: [facility];  Service: Gastroenterology;  Laterality: N/A;   HYSTERECTOMY ABDOMINAL WITH SALPINGECTOMY     LIVER BIOPSY  2008, 2013   NASH   TONSILLECTOMY     UPPER GASTROINTESTINAL ENDOSCOPY      OB History     Gravida  0   Para  0   Term  0   Preterm  0   AB  0   Living  0      SAB  0   IAB  0   Ectopic  0   Multiple  0   Live Births  0            Home Medications    Prior to Admission medications   Medication Sig Start Date End Date Taking? Authorizing Provider  ALPRAZolam  (XANAX ) 0.5 MG tablet Take 0.5-1 tablets (0.25-0.5 mg total) by mouth 3 (three) times daily as needed for anxiety. 10/25/23   Rhys Verneita DASEN, PA-C  aspirin EC 81 MG tablet Take 81 mg by mouth every evening.    [provider]  buPROPion  (WELLBUTRIN  XL) 150 MG 24 hr tablet Take 1 tablet (150 mg total) by mouth daily. 10/25/23   Rhys Verneita T, PA-C  Calcium -Magnesium (CAL-MAG PO) Take by mouth.    [provider]  Cholecalciferol (VITAMIN D3) 50 MCG (2000 UT) TABS Take 2,000 Units by mouth in the morning.    [provider]  Continuous Glucose Sensor (DEXCOM G7 SENSOR) MISC CHANGE SENSORS EVERY 10    DAYS 08/09/23   Shamleffer, Ibtehal Jaralla, MD  dapagliflozin  propanediol (FARXIGA ) 10 MG TABS tablet TAKE 1 TABLET DAILY 07/15/23   Shamleffer, Ibtehal Jaralla, MD  glimepiride  (AMARYL ) 4 MG tablet Take 1 tablet (4 mg total) by mouth daily before breakfast. 09/25/23   Shamleffer, Ibtehal Jaralla, MD  glucose blood (CONTOUR NEXT TEST) test strip Check blood sugar 1 time daily 11/06/21   Shamleffer, Ibtehal Jaralla, MD  hydrochlorothiazide  (HYDRODIURIL ) 25 MG tablet TAKE 1 TABLET DAILY 07/16/23   Theophilus Andrews, Tully GRADE, MD  insulin  glargine, 2 Unit Dial, (TOUJEO  MAX SOLOSTAR)  300 UNIT/ML Solostar Pen Inject 12 Units into the skin at bedtime. 09/25/23   Shamleffer, Ibtehal Jaralla, MD  Insulin  Pen Needle 32G X 4 MM MISC 1 Device by Does not apply route daily in the afternoon. 09/25/23   Shamleffer, Ibtehal Jaralla, MD  levothyroxine  (SYNTHROID ) 88 MCG tablet TAKE 1 TABLET DAILY BEFORE BREAKFAST 03/04/23   Theophilus Andrews, Tully GRADE, MD  lisinopril  (ZESTRIL ) 20 MG tablet TAKE 1 TABLET DAILY 07/16/23   Theophilus Andrews, Tully GRADE, MD  magnesium chloride (SLOW-MAG) 64 MG TBEC SR tablet Take 1 tablet by mouth daily.    [provider]  Melatonin 10 MG CAPS Take 10 mg by mouth at bedtime.    [provider]  metFORMIN  (GLUCOPHAGE -XR) 500 MG 24 hr tablet Take 2 tablets (1,000  mg total) by mouth 2 (two) times daily with a meal. 09/25/23   Shamleffer, Donell Cardinal, MD  Microlet Lancets MISC by Does not apply route. Test 3-4 times daily (Micorlet Colored Lancets)    [provider]  Omega-3 Fatty Acids (OMEGA 3 500 PO) Take 1,000 mg by mouth.    [provider]  pantoprazole  (PROTONIX ) 40 MG tablet Take 1 tablet (40 mg total) by mouth 2 (two) times daily before a meal. 01/11/23   Pyrtle, Gordy HERO, MD  REZDIFFRA 80 MG TABS Take 1 tablet by mouth daily.    [provider]  rosuvastatin  (CRESTOR ) 10 MG tablet TAKE 1 TABLET DAILY 07/16/23   Theophilus Andrews, Tully GRADE, MD  venlafaxine  XR (EFFEXOR -XR) 150 MG 24 hr capsule Take 1 capsule (150 mg total) by mouth daily with breakfast. 10/25/23   Rhys Verneita DASEN, PA-C    Family History Family History  Problem Relation Age of Onset   Depression Mother    Breast cancer Mother    Colon polyps Mother    Stroke Mother 50   GER disease Father    Diabetes Father    Kidney disease Father    CAD Father    Other Father        cause of death listed as resp. failure   Stroke Sister 21   Colon polyps Sister    Pancreatic cancer Maternal Uncle    Pancreatic cancer Paternal Uncle    Diabetes Maternal  Grandfather    Heart disease Maternal Grandfather    Depression Maternal Grandmother    CAD Paternal Grandfather    Diabetes Paternal Grandmother    Colon cancer Neg Hx    Esophageal cancer Neg Hx    Stomach cancer Neg Hx    Rectal cancer Neg Hx     Social History Social History   Tobacco Use   Smoking status: Former    Current packs/day: 0.00    Types: Cigarettes    Quit date: 2009    Years since quitting: 16.8   Smokeless tobacco: Never   Tobacco comments:    quit early 2000 per pt  Vaping Use   Vaping status: Never Used  Substance Use Topics   Alcohol use: Yes    Comment: occasional, maybe 1-2 per month   Drug use: Never     Allergies   Trulicity  [dulaglutide ], Spironolactone, Paxil [paroxetine hcl], Sulfa antibiotics, and Codeine   Review of Systems Review of Systems  Eyes:  Positive for pain. Negative for photophobia, discharge, redness, itching and visual disturbance.     Physical Exam Triage Vital Signs ED Triage Vitals [11/08/23 1552]  Encounter Vitals Group     BP 127/78     Girls Systolic BP Percentile      Girls Diastolic BP Percentile      Boys Systolic BP Percentile      Boys Diastolic BP Percentile      Pulse Rate 89     Resp 16     Temp 98.3 F (36.8 C)     Temp Source Oral     SpO2 95 %     Weight      Height      Head Circumference      Peak Flow      Pain Score 3     Pain Loc      Pain Education      Exclude from Growth Chart    No data found.  Updated Vital Signs BP  127/78 (BP Location: Right Arm)   Pulse 89   Temp 98.3 F (36.8 C) (Oral)   Resp 16   SpO2 95%   Visual Acuity Right Eye Distance:   Left Eye Distance:   Bilateral Distance:    Right Eye Near:   Left Eye Near:    Bilateral Near:     Physical Exam Vitals reviewed.  Constitutional:      General: She is awake.     Appearance: Normal appearance. She is well-developed and well-groomed.  HENT:     Head: Normocephalic and atraumatic.  Eyes:      General: Lids are normal. Lids are everted, no foreign bodies appreciated. Gaze aligned appropriately.        Left eye: Hordeolum present.    Extraocular Movements: Extraocular movements intact.     Right eye: Normal extraocular motion and no nystagmus.     Left eye: Normal extraocular motion and no nystagmus.     Conjunctiva/sclera: Conjunctivae normal.     Pupils: Pupils are equal, round, and reactive to light.  Pulmonary:     Effort: Pulmonary effort is normal.  Neurological:     Mental Status: She is alert and oriented to person, place, and time.  Psychiatric:        Attention and Perception: Attention and perception normal.        Mood and Affect: Mood and affect normal.        Speech: Speech normal.        Behavior: Behavior normal. Behavior is cooperative.      UC Treatments / Results  Labs (all labs ordered are listed, but only abnormal results are displayed) Labs Reviewed - No data to display  EKG   Radiology No results found.  Procedures Procedures (including critical care time)  Medications Ordered in UC Medications - No data to display  Initial Impression / Assessment and Plan / UC Course  I have reviewed the triage vital signs and the nursing notes.  Pertinent labs & imaging results that were available during my care of the patient were reviewed by me and considered in my medical decision making (see chart for details).      Final Clinical Impressions(s) / UC Diagnoses   Final diagnoses:  Hordeolum externum of left upper eyelid   Left eyelid sty (hordeolum) Swelling and tenderness of the left eyelid, progressively worsening since last night. No discharge, vision changes, photophobia, or conjunctival injection. Likely a sty (hordeolum) rather than conjunctivitis. No foreign body detected upon examination. - Continue warm compresses to the affected eyelid. - Use lubricating eye drops such as Blink Tears or Refresh to alleviate gritty sensation. -  Follow up with an ophthalmologist if the sty persists for more than two weeks or increases in size.    Discharge Instructions      It appears that you have developed a stye (hordeolum) on your left upper  eyelid  These are easily treated with at-home measures and generally resolve over 1-2 weeks  Use a warm compress over the eye for 5-10 minutes at a time, 2-4 times per day to help drainage Use a gentle solution of diluted baby wash and water to wash your eyelids and eyelashes at night to prevent buildup of skin and dried tears You can use artificial tears and lubricating eye drops to help with irritation to your eye  Options include Blink tears and Refresh   If the swelling persists or increases in size to encompass the entire eye area,  you develop a fever, vision changes or copious amounts of drainage that is yellow-green.       ED Prescriptions   None    PDMP not reviewed this encounter.   Wyatt Galvan, Rocky BRAVO, PA-C 11/08/23 1703    Tarah Buboltz E, PA-C 11/08/23 1734

## 2023-11-08 NOTE — ED Triage Notes (Addendum)
 Patient presents to [facility] for bilateral eyelid redness and tenderness since 2 days ago. Left is worse. Congestion since today. Reports using warm compresses. Hx of seasonal allergies.

## 2023-11-08 NOTE — Telephone Encounter (Signed)
 [name] called and said that at her last visit Krista Dawson and her discussed increasing her wellbutrin . She wants to talk to Krista Dawson about things that have happened lately and get her opinion on if she agrees to the increase. Please give her a call at [phone]

## 2023-11-29 ENCOUNTER — Ambulatory Visit (INDEPENDENT_AMBULATORY_CARE_PROVIDER_SITE_OTHER): Admitting: Psychology

## 2023-11-29 DIAGNOSIS — F4323 Adjustment disorder with mixed anxiety and depressed mood: Secondary | ICD-10-CM

## 2023-11-29 DIAGNOSIS — F411 Generalized anxiety disorder: Secondary | ICD-10-CM | POA: Diagnosis not present

## 2023-11-29 DIAGNOSIS — F3341 Major depressive disorder, recurrent, in partial remission: Secondary | ICD-10-CM

## 2023-12-01 NOTE — Progress Notes (Signed)
 [facility] Counselor/Therapist Progress Note  Patient ID: Krista Dawson, MRN: [id number],    Date:11/29/2023  Time Spent: 60 minutes  Time In:  10:00  Time Out:  11:00  Treatment Type: Individual Therapy  Reported Symptoms: sadness,anxiety  Mental Status Exam: Appearance:  Casual     Behavior: Appropriate  Motor: Normal  Speech/Language:  Clear and Coherent  Affect: Blunt  Mood: pleasant  Thought process: normal  Thought content:   WNL  Sensory/Perceptual disturbances:   WNL  Orientation: oriented to person, place, time/date, and situation  Attention: Good  Concentration: Good  Memory: WNL  Fund of knowledge:  Good  Insight:   Good  Judgment:  Good  Impulse Control: Good   Risk Assessment: Danger to Self:  No Self-injurious Behavior: No Danger to Others: No Duty to Warn:no Physical Aggression / Violence:No  Access to Firearms a concern: No  Gang Involvement:No   Subjective: The patient attended a face-to-face individual therapy session in the office today.  The patient presents as pleasant and cooperative.  The patient does seem better today than she was the last time I saw her.  We talked about the need to continue with the EMDR moving forward as I do think she is very reactive to anyone giving her any negative feedback.  She did report that she feels like it goes right back to when she was a little girl and her father and his alcohol problem.  We began identifying the negative cognitions that we want to address today and we also figured out that she will use the Boser's and that we will do the safe place exercising container exercise the next time that we meet. Interventions: Cognitive Behavioral Therapy, Assertiveness/Communication, and Insight-Oriented, EMDR  Diagnosis:Recurrent major depression in partial remission  Generalized anxiety disorder  Adjustment disorder with mixed anxiety and depressed mood  Plan: Client Abilities/Strengths   Intelligent, caring, insightful  Client Treatment Preferences  Outpatient individual therapy  Client Statement of Needs  I need help to deal with all of the stressors going on that are contributing to my depression  Treatment Level  Outpatient Individual therapy  Symptoms  Decrease or loss of appetite.: No Description Entered (Status: maintained). Depressed or irritable  mood.: No Description Entered (Status: maintained). Lack of energy.: No Description Entered (Status:  maintained).  Problems Addressed  Unipolar Depression, Unipolar Depression  Goals 1. Develop healthy thinking patterns and beliefs about self, others, and the world that lead to the alleviation and help prevent the relapse of  depression. Objective Learn and implement behavioral strategies to overcome depression. Target Date: 12/11/2024 Frequency: biWeekly Progress: 60 Modality: individual  Related Interventions 1. Assist the client in developing skills that increase the likelihood of deriving pleasure from  behavioral activation (e.g., assertiveness skills, developing an exercise plan, less internal/more  external focus, increased social involvement); reinforce success. Objective Learn and implement problem-solving and decision-making skills. Target Date: 12/04/2026Frequency: biWeekly Progress: 50 Modality: individual Objective Verbalize an understanding and resolution of current interpersonal problems. Target Date: 12/11/2024 Frequency: biWeekly Progress: 30 Modality: individual Related Interventions 1. For interpersonal disputes, help the client explore the relationship, the nature of the dispute,  whether it has reached an impasse, and available options to resolve it including learning and  implementing conflict-resolution skills; if the relationship has reached an impasse, consider  ways to change the impasse or to end the relationship. Objective Identify and replace thoughts and beliefs that support  depression. Target Date: 12/11/2024 Frequency: biWeekly Progress: 60 Modality: individual  Related Interventions 1. Facilitate and reinforce the client's shift from biased depressive self-talk and beliefs to realitybased cognitive messages that enhance self-confidence and increase adaptive actions (see  Positive Self-Talk in the Adult Psychotherapy Homework Planner by Jenniffer). Objective Describe current and past experiences with depression including their impact on functioning and  attempts to resolve it. Target Date: 12/11/2024 Frequency: biWeekly Progress: 40 Modality: individual  Related Interventions 1. Encourage the client to share his/her thoughts and feelings of depression; express empathy and  build rapport while identifying primary cognitive, behavioral, interpersonal, or other  contributors to depression. 2. Recognize, accept, and cope with feelings of depression. Diagnosis F43.23  Adjustment Disorder with mixed anxiety and depression  Major Depressive disorder, recurrent, moderate Conditions For Discharge Achievement of treatment goals and objectives  Nikira Kushnir G Demitrios Molyneux, LCSW

## 2023-12-11 ENCOUNTER — Other Ambulatory Visit: Payer: Self-pay | Admitting: Internal Medicine

## 2023-12-11 DIAGNOSIS — I10 Essential (primary) hypertension: Secondary | ICD-10-CM

## 2023-12-11 DIAGNOSIS — E039 Hypothyroidism, unspecified: Secondary | ICD-10-CM

## 2023-12-11 DIAGNOSIS — E1169 Type 2 diabetes mellitus with other specified complication: Secondary | ICD-10-CM

## 2023-12-13 ENCOUNTER — Ambulatory Visit: Admitting: Psychology

## 2023-12-13 DIAGNOSIS — F3341 Major depressive disorder, recurrent, in partial remission: Secondary | ICD-10-CM

## 2023-12-13 DIAGNOSIS — F411 Generalized anxiety disorder: Secondary | ICD-10-CM

## 2023-12-13 DIAGNOSIS — F4323 Adjustment disorder with mixed anxiety and depressed mood: Secondary | ICD-10-CM

## 2023-12-15 NOTE — Progress Notes (Signed)
 [facility] Counselor/Therapist Progress Note  Patient ID: Krista Dawson, MRN: [id number],    Date:12/13/2023  Time Spent: 60 minutes  Time In:  11:00  Time Out:  12:00  Treatment Type: Individual Therapy  Reported Symptoms: sadness,anxiety  Mental Status Exam: Appearance:  Casual     Behavior: Appropriate  Motor: Normal  Speech/Language:  Clear and Coherent  Affect: Blunt  Mood: pleasant  Thought process: normal  Thought content:   WNL  Sensory/Perceptual disturbances:   WNL  Orientation: oriented to person, place, time/date, and situation  Attention: Good  Concentration: Good  Memory: WNL  Fund of knowledge:  Good  Insight:   Good  Judgment:  Good  Impulse Control: Good   Risk Assessment: Danger to Self:  No Self-injurious Behavior: No Danger to Others: No Duty to Warn:no Physical Aggression / Violence:No  Access to Firearms a concern: No  Gang Involvement:No   Subjective: The patient attended a face-to-face individual therapy session in the office today.  The patient presents as pleasant and cooperative.  The patient reports that she has been trying to be more mindful about the things we had talked about doing the EMDR on already.  Today we went ahead and processed all of her events that happened that we were going to process.  There were 4 of these and her suds score did decrease dramatically with each of them when we did the EMDR.  I asked her to make me aware or let me know how she feels between sessions and we will see what else we need to do as far as the EMDR moving forward when she comes in the next time. Interventions: Cognitive Behavioral Therapy, Assertiveness/Communication, and Insight-Oriented, EMDR  Diagnosis:Recurrent major depression in partial remission  Generalized anxiety disorder  Adjustment disorder with mixed anxiety and depressed mood  Plan: Client Abilities/Strengths  Intelligent, caring, insightful  Client Treatment  Preferences  Outpatient individual therapy  Client Statement of Needs  I need help to deal with all of the stressors going on that are contributing to my depression  Treatment Level  Outpatient Individual therapy  Symptoms  Decrease or loss of appetite.: No Description Entered (Status: maintained). Depressed or irritable  mood.: No Description Entered (Status: maintained). Lack of energy.: No Description Entered (Status:  maintained).  Problems Addressed  Unipolar Depression, Unipolar Depression  Goals 1. Develop healthy thinking patterns and beliefs about self, others, and the world that lead to the alleviation and help prevent the relapse of  depression. Objective Learn and implement behavioral strategies to overcome depression. Target Date: 12/11/2024 Frequency: biWeekly Progress: 60 Modality: individual  Related Interventions 1. Assist the client in developing skills that increase the likelihood of deriving pleasure from  behavioral activation (e.g., assertiveness skills, developing an exercise plan, less internal/more  external focus, increased social involvement); reinforce success. Objective Learn and implement problem-solving and decision-making skills. Target Date: 12/04/2026Frequency: biWeekly Progress: 50 Modality: individual Objective Verbalize an understanding and resolution of current interpersonal problems. Target Date: 12/11/2024 Frequency: biWeekly Progress: 30 Modality: individual Related Interventions 1. For interpersonal disputes, help the client explore the relationship, the nature of the dispute,  whether it has reached an impasse, and available options to resolve it including learning and  implementing conflict-resolution skills; if the relationship has reached an impasse, consider  ways to change the impasse or to end the relationship. Objective Identify and replace thoughts and beliefs that support depression. Target Date: 12/11/2024 Frequency:  biWeekly Progress: 60 Modality: individual Related Interventions 1. Facilitate  and reinforce the client's shift from biased depressive self-talk and beliefs to realitybased cognitive messages that enhance self-confidence and increase adaptive actions (see  Positive Self-Talk in the Adult Psychotherapy Homework Planner by Jenniffer). Objective Describe current and past experiences with depression including their impact on functioning and  attempts to resolve it. Target Date: 12/11/2024 Frequency: biWeekly Progress: 40 Modality: individual  Related Interventions 1. Encourage the client to share his/her thoughts and feelings of depression; express empathy and  build rapport while identifying primary cognitive, behavioral, interpersonal, or other  contributors to depression. 2. Recognize, accept, and cope with feelings of depression. Diagnosis F43.23  Adjustment Disorder with mixed anxiety and depression  Major Depressive disorder, recurrent, moderate Conditions For Discharge Achievement of treatment goals and objectives  Treana Lacour G Indigo Barbian, LCSW

## 2023-12-18 ENCOUNTER — Ambulatory Visit: Admitting: Psychology

## 2023-12-18 DIAGNOSIS — F3341 Major depressive disorder, recurrent, in partial remission: Secondary | ICD-10-CM

## 2023-12-18 DIAGNOSIS — F411 Generalized anxiety disorder: Secondary | ICD-10-CM

## 2023-12-18 DIAGNOSIS — F4323 Adjustment disorder with mixed anxiety and depressed mood: Secondary | ICD-10-CM

## 2023-12-19 NOTE — Progress Notes (Signed)
 [facility] Counselor/Therapist Progress Note  Patient ID: Krista Dawson, MRN: [id number],    Date:12/18/2023  Time Spent: 60 minutes  Time In:  10:00  Time Out:  11:00  Treatment Type: Individual Therapy  Reported Symptoms: sadness,anxiety  Mental Status Exam: Appearance:  Casual     Behavior: Appropriate  Motor: Normal  Speech/Language:  Clear and Coherent  Affect: Blunt  Mood: pleasant  Thought process: normal  Thought content:   WNL  Sensory/Perceptual disturbances:   WNL  Orientation: oriented to person, place, time/date, and situation  Attention: Good  Concentration: Good  Memory: WNL  Fund of knowledge:  Good  Insight:   Good  Judgment:  Good  Impulse Control: Good   Risk Assessment: Danger to Self:  No Self-injurious Behavior: No Danger to Others: No Duty to Warn:no Physical Aggression / Violence:No  Access to Firearms a concern: No  Gang Involvement:No   Subjective: The patient attended a face-to-face individual therapy session in the office today.  The patient presents as pleasant and cooperative.  The patient reports that she had a situation where she was reading emails and she automatically went to I Can't, but she was able to change her thoughts to I can do this and I can handle this.  We talked about the difference between something that was a behavioral habit and an emotional reaction.  It seems that the EMDR did work but that she is so used to going back to her negative self-talk that she is going to have to work on how to change her dialogue with him herself.  I asked her to pay attention to how she is feeling and if we need to do more EMDR we can in the future but I feel like the EMDR we did probably was already helpful to her because she was able to change the narrative and that she just needs to be more mindful about her thought patterns.  interventions: Cognitive Behavioral Therapy, Assertiveness/Communication, and Insight-Oriented,  EMDR  Diagnosis:Recurrent major depression in partial remission  Generalized anxiety disorder  Adjustment disorder with mixed anxiety and depressed mood  Plan: Client Abilities/Strengths  Intelligent, caring, insightful  Client Treatment Preferences  Outpatient individual therapy  Client Statement of Needs  I need help to deal with all of the stressors going on that are contributing to my depression  Treatment Level  Outpatient Individual therapy  Symptoms  Decrease or loss of appetite.: No Description Entered (Status: maintained). Depressed or irritable  mood.: No Description Entered (Status: maintained). Lack of energy.: No Description Entered (Status:  maintained).  Problems Addressed  Unipolar Depression, Unipolar Depression  Goals 1. Develop healthy thinking patterns and beliefs about self, others, and the world that lead to the alleviation and help prevent the relapse of  depression. Objective Learn and implement behavioral strategies to overcome depression. Target Date: 12/11/2024 Frequency: biWeekly Progress: 70 Modality: individual  Related Interventions 1. Assist the client in developing skills that increase the likelihood of deriving pleasure from  behavioral activation (e.g., assertiveness skills, developing an exercise plan, less internal/more  external focus, increased social involvement); reinforce success. Objective Learn and implement problem-solving and decision-making skills. Target Date: 12/04/2026Frequency: biWeekly Progress: 50 Modality: individual Objective Verbalize an understanding and resolution of current interpersonal problems. Target Date: 12/11/2024 Frequency: biWeekly Progress: 70 Modality: individual Related Interventions 1. For interpersonal disputes, help the client explore the relationship, the nature of the dispute,  whether it has reached an impasse, and available options to resolve it including learning  and  implementing  conflict-resolution skills; if the relationship has reached an impasse, consider  ways to change the impasse or to end the relationship. Objective Identify and replace thoughts and beliefs that support depression. Target Date: 12/11/2024 Frequency: biWeekly Progress: 70 Modality: individual Related Interventions 1. Facilitate and reinforce the client's shift from biased depressive self-talk and beliefs to realitybased cognitive messages that enhance self-confidence and increase adaptive actions (see  Positive Self-Talk in the Adult Psychotherapy Homework Planner by Jenniffer). Objective Describe current and past experiences with depression including their impact on functioning and  attempts to resolve it. Target Date: 12/11/2024 Frequency: biWeekly Progress: 50 Modality: individual  Related Interventions 1. Encourage the client to share his/her thoughts and feelings of depression; express empathy and  build rapport while identifying primary cognitive, behavioral, interpersonal, or other  contributors to depression. 2. Recognize, accept, and cope with feelings of depression. Diagnosis F43.23  Adjustment Disorder with mixed anxiety and depression  Major Depressive disorder, recurrent, moderate Conditions For Discharge Achievement of treatment goals and objectives  Salsabeel Gorelick G Arelis Neumeier, LCSW

## 2023-12-23 ENCOUNTER — Encounter: Payer: Self-pay | Admitting: Physician Assistant

## 2023-12-23 ENCOUNTER — Ambulatory Visit: Admitting: Physician Assistant

## 2023-12-23 DIAGNOSIS — F331 Major depressive disorder, recurrent, moderate: Secondary | ICD-10-CM

## 2023-12-23 DIAGNOSIS — F411 Generalized anxiety disorder: Secondary | ICD-10-CM

## 2023-12-23 MED ORDER — BUPROPION HCL ER (XL) 300 MG PO TB24: 300.0000 mg | ORAL_TABLET | Freq: Every day | ORAL | 1 refills | Status: AC

## 2023-12-23 NOTE — Progress Notes (Signed)
 [facility] Med Check  Patient ID: Krista Dawson,  MRN: [id number]  PCP: Theophilus Andrews, Tully GRADE, MD  Date of Evaluation: 12/23/2023 Time spent:20 minutes  Chief Complaint:  Chief Complaint   Anxiety; Depression; Follow-up    HISTORY/CURRENT STATUS: HPI For routine med check.  Isn't motivated.  She procrastinates a lot. Energy is about the same as it was.  Her sister was dx w/ ADHD, her provider told her to tell me about that. She is able to enjoy things.  Work is going well.   No extreme sadness, tearfulness, or feelings of hopelessness.  Sleeps ok most of the time. Sometimes she falls asleep on the sofa, and then when she gets up to go to bed, is able to go back to sleep, but doesn't wake up feeling rested.  ADLs and personal hygiene are normal.   Sometimes has trouble focusing, mostly procrastinating. It takes her awhile to finish things b/c she gets distracted. Appetite has not changed.  Weight is stable.  Still gets overwhelmed easily.  No PA.  Takes the Xanax  usually at night but not at present b/c she's on Flexeril .  Sometimes takes 1/2 pill during the day.  But not daily.  No mania, delirium, AH/VH.  No SI/HI.  Individual Medical History/ Review of Systems: Changes? :Yes  fell since LOV. No fx, but still having pain in right shoulder. Is under the care of Ortho.   Past medications for mental health diagnoses include: Paxil, Prozac, Wellbutrin , lexapro, Effexor  started in early 2019 after hysterectomy for hot flashes and mood Ambien, Xanax   No mental health hospitalizations No suicide attempts Allergies: Trulicity  [dulaglutide ], Spironolactone, Paxil [paroxetine hcl], Sulfa antibiotics, and Codeine  Current Medications:  Current Outpatient Medications:    ALPRAZolam  (XANAX ) 0.5 MG tablet, Take 0.5-1 tablets (0.25-0.5 mg total) by mouth 3 (three) times daily as needed for anxiety., Disp: 60 tablet, Rfl: 1   aspirin EC 81 MG tablet, Take 81 mg by mouth every evening.,  Disp: , Rfl:    Calcium -Magnesium (CAL-MAG PO), Take by mouth., Disp: , Rfl:    Cholecalciferol (VITAMIN D3) 50 MCG (2000 UT) TABS, Take 2,000 Units by mouth in the morning., Disp: , Rfl:    Continuous Glucose Sensor (DEXCOM G7 SENSOR) MISC, CHANGE SENSORS EVERY 10    DAYS, Disp: 9 each, Rfl: 3   cyclobenzaprine  (FLEXERIL ) 10 MG tablet, Take 1 tablet twice a day by oral route as needed for 10 days., Disp: , Rfl:    dapagliflozin  propanediol (FARXIGA ) 10 MG TABS tablet, TAKE 1 TABLET DAILY, Disp: 90 tablet, Rfl: 3   glimepiride  (AMARYL ) 4 MG tablet, Take 1 tablet (4 mg total) by mouth daily before breakfast., Disp: 90 tablet, Rfl: 3   glucose blood (CONTOUR NEXT TEST) test strip, Check blood sugar 1 time daily, Disp: 100 each, Rfl: 12   hydrochlorothiazide  (HYDRODIURIL ) 25 MG tablet, TAKE 1 TABLET DAILY, Disp: 90 tablet, Rfl: 1   insulin  glargine, 2 Unit Dial, (TOUJEO  MAX SOLOSTAR) 300 UNIT/ML Solostar Pen, Inject 12 Units into the skin at bedtime., Disp: 15 mL, Rfl: 3   Insulin  Pen Needle 32G X 4 MM MISC, 1 Device by Does not apply route daily in the afternoon., Disp: 100 each, Rfl: 4   lisinopril  (ZESTRIL ) 20 MG tablet, TAKE 1 TABLET DAILY, Disp: 90 tablet, Rfl: 1   magnesium chloride (SLOW-MAG) 64 MG TBEC SR tablet, Take 1 tablet by mouth daily., Disp: , Rfl:    Melatonin 10 MG CAPS, Take 10 mg by  mouth at bedtime., Disp: , Rfl:    metFORMIN  (GLUCOPHAGE -XR) 500 MG 24 hr tablet, Take 2 tablets (1,000 mg total) by mouth 2 (two) times daily with a meal., Disp: 360 tablet, Rfl: 3   Microlet Lancets MISC, by Does not apply route. Test 3-4 times daily (Micorlet Colored Lancets), Disp: , Rfl:    Omega-3 Fatty Acids (OMEGA 3 500 PO), Take 1,000 mg by mouth., Disp: , Rfl:    pantoprazole  (PROTONIX ) 40 MG tablet, Take 1 tablet (40 mg total) by mouth 2 (two) times daily before a meal., Disp: 180 tablet, Rfl: 3   REZDIFFRA 80 MG TABS, Take 1 tablet by mouth daily., Disp: , Rfl:    rosuvastatin  (CRESTOR ) 10  MG tablet, TAKE 1 TABLET DAILY, Disp: 90 tablet, Rfl: 1   SYNTHROID  88 MCG tablet, TAKE 1 TABLET DAILY BEFORE BREAKFAST, Disp: 90 tablet, Rfl: 1   venlafaxine  XR (EFFEXOR -XR) 150 MG 24 hr capsule, Take 1 capsule (150 mg total) by mouth daily with breakfast., Disp: 90 capsule, Rfl: 1   buPROPion  (WELLBUTRIN  XL) 300 MG 24 hr tablet, Take 1 tablet (300 mg total) by mouth daily., Disp: 90 tablet, Rfl: 1 Medication Side Effects: none  Family Medical/ Social History: Changes? No  MENTAL HEALTH EXAM:  There were no vitals taken for this visit.There is no height or weight on file to calculate BMI.  General Appearance: Casual and Well Groomed  Eye Contact:  Good  Speech:  Clear and Coherent and Normal Rate  Volume:  Normal  Mood:  Euthymic  Affect:  Congruent  Thought Process:  Goal Directed and Descriptions of Associations: Circumstantial  Orientation:  Full (Time, Place, and Person)  Thought Content: Logical   Suicidal Thoughts:  No  Homicidal Thoughts:  No  Memory:  WNL  Judgement:  Good  Insight:  Good  Psychomotor Activity:  Normal  Concentration:  Concentration: Good and Attention Span: Good  Recall:  Good  Fund of Knowledge: Good  Language: Good  Assets:  Communication Skills Desire for Improvement Financial Resources/Insurance Housing Resilience Social Support Transportation Vocational/Educational  ADL's:  Intact  Cognition: WNL  Prognosis:  Good   DIAGNOSES:    ICD-10-CM   1. Major depressive disorder, recurrent episode, moderate (HCC)  F33.1     2. Generalized anxiety disorder  F41.1       Receiving Psychotherapy: Yes   with Bambi Cottle, LCSW  RECOMMENDATIONS:   PDMP reviewed.  Xanax  filled 10/29/2023. I provided approximately  20 minutes of face to face time during this encounter, including time spent before and after the visit in records review, medical decision making, counseling pertinent to today's visit, and charting.   We discussed the possibility of  ADHD. She does have some signs, I recommend increasing the Wellbutrin  before we decide to add a stronger stimulant. She's not taking the Xanax  daily so if needed, we can add a stimulant.   Continue Xanax  0.5 mg, 1/2-1 p.o. 3 times daily as needed anxiety. Increase Wellbutrin  XL to 300 mg, 1 q am.  Continue Effexor  XR 150 mg daily.  Continue therapy with Bambi Cottle LCSW. Return in  6 weeks.   Verneita Cooks, PA-C

## 2023-12-27 ENCOUNTER — Ambulatory Visit: Admitting: Psychology

## 2023-12-27 DIAGNOSIS — F3341 Major depressive disorder, recurrent, in partial remission: Secondary | ICD-10-CM | POA: Diagnosis not present

## 2023-12-27 DIAGNOSIS — F411 Generalized anxiety disorder: Secondary | ICD-10-CM | POA: Diagnosis not present

## 2023-12-27 DIAGNOSIS — F4323 Adjustment disorder with mixed anxiety and depressed mood: Secondary | ICD-10-CM

## 2023-12-29 NOTE — Progress Notes (Signed)
 " [facility] Counselor/Therapist Progress Note  Patient ID: Krista Dawson, MRN: [id number],    Date:12/27/2023  Time Spent: 60 minutes  Time In:  10:00  Time Out:  11:00  Treatment Type: Individual Therapy  Reported Symptoms: sadness,anxiety  Mental Status Exam: Appearance:  Casual     Behavior: Appropriate  Motor: Normal  Speech/Language:  Clear and Coherent  Affect: Blunt  Mood: pleasant  Thought process: normal  Thought content:   WNL  Sensory/Perceptual disturbances:   WNL  Orientation: oriented to person, place, time/date, and situation  Attention: Good  Concentration: Good  Memory: WNL  Fund of knowledge:  Good  Insight:   Good  Judgment:  Good  Impulse Control: Good   Risk Assessment: Danger to Self:  No Self-injurious Behavior: No Danger to Others: No Duty to Warn:no Physical Aggression / Violence:No  Access to Firearms a concern: No  Gang Involvement:No   Subjective: The patient attended a face-to-face individual therapy session in the office today.  The patient presents as pleasant and cooperative.  The patient states that she has been doing a better job of catching herself when she is getting ready to go toward negative self-talk.  I do believe that the EMDR worked as she is able to catch herself as opposed to just being reactive.  We talked about how thoughts work in your brain and that she is going to have to work with herself on reprogramming herself.  She is doing some positive cognitions on her way to work in the mornings and that seems to be going well.  The patient is doing well in therapy and we will continue to have appointments about every 2 weeks until she feels like she can graduate. interventions: Cognitive Behavioral Therapy, Assertiveness/Communication, and Insight-Oriented, EMDR  Diagnosis:Recurrent major depression in partial remission  Generalized anxiety disorder  Adjustment disorder with mixed anxiety and depressed  mood  Plan: Client Abilities/Strengths  Intelligent, caring, insightful  Client Treatment Preferences  Outpatient individual therapy  Client Statement of Needs  I need help to deal with all of the stressors going on that are contributing to my depression  Treatment Level  Outpatient Individual therapy  Symptoms  Decrease or loss of appetite.: No Description Entered (Status: maintained). Depressed or irritable  mood.: No Description Entered (Status: maintained). Lack of energy.: No Description Entered (Status:  maintained).  Problems Addressed  Unipolar Depression, Unipolar Depression  Goals 1. Develop healthy thinking patterns and beliefs about self, others, and the world that lead to the alleviation and help prevent the relapse of  depression. Objective Learn and implement behavioral strategies to overcome depression. Target Date: 12/11/2024 Frequency: biWeekly Progress: 70 Modality: individual  Related Interventions 1. Assist the client in developing skills that increase the likelihood of deriving pleasure from  behavioral activation (e.g., assertiveness skills, developing an exercise plan, less internal/more  external focus, increased social involvement); reinforce success. Objective Learn and implement problem-solving and decision-making skills. Target Date: 12/04/2026Frequency: biWeekly Progress: 60 Modality: individual Objective Verbalize an understanding and resolution of current interpersonal problems. Target Date: 12/11/2024 Frequency: biWeekly Progress: 70 Modality: individual Related Interventions 1. For interpersonal disputes, help the client explore the relationship, the nature of the dispute,  whether it has reached an impasse, and available options to resolve it including learning and  implementing conflict-resolution skills; if the relationship has reached an impasse, consider  ways to change the impasse or to end the relationship. Objective Identify and  replace thoughts and beliefs that support depression. Target  Date: 12/11/2024 Frequency: biWeekly Progress: 70 Modality: individual Related Interventions 1. Facilitate and reinforce the client's shift from biased depressive self-talk and beliefs to realitybased cognitive messages that enhance self-confidence and increase adaptive actions (see  Positive Self-Talk in the Adult Psychotherapy Homework Planner by Jenniffer). Objective Describe current and past experiences with depression including their impact on functioning and  attempts to resolve it. Target Date: 12/11/2024 Frequency: biWeekly Progress: 50 Modality: individual  Related Interventions 1. Encourage the client to share his/her thoughts and feelings of depression; express empathy and  build rapport while identifying primary cognitive, behavioral, interpersonal, or other  contributors to depression. 2. Recognize, accept, and cope with feelings of depression. Diagnosis F43.23  Adjustment Disorder with mixed anxiety and depression  Major Depressive disorder, recurrent, moderate Conditions For Discharge Achievement of treatment goals and objectives  Henryk Ursin G [employer], LCSW                   "

## 2024-01-17 ENCOUNTER — Ambulatory Visit: Admitting: Psychology

## 2024-01-17 DIAGNOSIS — F4323 Adjustment disorder with mixed anxiety and depressed mood: Secondary | ICD-10-CM | POA: Diagnosis not present

## 2024-01-17 DIAGNOSIS — F411 Generalized anxiety disorder: Secondary | ICD-10-CM | POA: Diagnosis not present

## 2024-01-17 DIAGNOSIS — F3341 Major depressive disorder, recurrent, in partial remission: Secondary | ICD-10-CM

## 2024-01-17 NOTE — Progress Notes (Signed)
 " [facility] Counselor/Therapist Progress Note  Patient ID: Krista Dawson, MRN: [id number],    Date:01/17/2024  Time Spent: 58 minutes  Time In:  10:02  Time Out:  11:00  Treatment Type: Individual Therapy  Reported Symptoms: sadness,anxiety  Mental Status Exam: Appearance:  Casual     Behavior: Appropriate  Motor: Normal  Speech/Language:  Clear and Coherent  Affect: Blunt  Mood: pleasant  Thought process: normal  Thought content:   WNL  Sensory/Perceptual disturbances:   WNL  Orientation: oriented to person, place, time/date, and situation  Attention: Good  Concentration: Good  Memory: WNL  Fund of knowledge:  Good  Insight:   Good  Judgment:  Good  Impulse Control: Good   Risk Assessment: Danger to Self:  No Self-injurious Behavior: No Danger to Others: No Duty to Warn:no Physical Aggression / Violence:No  Access to Firearms a concern: No  Gang Involvement:No   Subjective: The patient attended a face-to-face individual therapy session in the office today.  The patient presents as pleasant and cooperative.  She reports that she still feels really good since we have done the EMDR and feels like it is really helping her at work not to get into issues with her coworkers now.  We talked about how it seems to be helping her and I do believe that it has made her more aware of her reactivity before and that she seems to have more balance now that we have done the EMDR and is not so focused on her work.  The patient reports that she would like to continue to see me until I retire and we will continue to do that as check and and that way we can make sure that everything that we have done so far will help her with her coping strategies.   Interventions: Cognitive Behavioral Therapy, Assertiveness/Communication, and Insight-Oriented, EMDR  Diagnosis:Recurrent major depression in partial remission  Generalized anxiety disorder  Adjustment disorder with mixed  anxiety and depressed mood  Plan: Client Abilities/Strengths  Intelligent, caring, insightful  Client Treatment Preferences  Outpatient individual therapy  Client Statement of Needs  I need help to deal with all of the stressors going on that are contributing to my depression  Treatment Level  Outpatient Individual therapy  Symptoms  Decrease or loss of appetite.: No Description Entered (Status: maintained). Depressed or irritable  mood.: No Description Entered (Status: maintained). Lack of energy.: No Description Entered (Status:  maintained).  Problems Addressed  Unipolar Depression, Unipolar Depression  Goals 1. Develop healthy thinking patterns and beliefs about self, others, and the world that lead to the alleviation and help prevent the relapse of  depression. Objective Learn and implement behavioral strategies to overcome depression. Target Date: 12/11/2024 Frequency: biWeekly Progress: 80 Modality: individual  Related Interventions 1. Assist the client in developing skills that increase the likelihood of deriving pleasure from  behavioral activation (e.g., assertiveness skills, developing an exercise plan, less internal/more  external focus, increased social involvement); reinforce success. Objective Learn and implement problem-solving and decision-making skills. Target Date: 12/04/2026Frequency: biWeekly Progress: 70 Modality: individual Objective Verbalize an understanding and resolution of current interpersonal problems. Target Date: 12/11/2024 Frequency: biWeekly Progress: 80 Modality: individual Related Interventions 1. For interpersonal disputes, help the client explore the relationship, the nature of the dispute,  whether it has reached an impasse, and available options to resolve it including learning and  implementing conflict-resolution skills; if the relationship has reached an impasse, consider  ways to change the impasse or to  end the  relationship. Objective Identify and replace thoughts and beliefs that support depression. Target Date: 12/11/2024 Frequency: biWeekly Progress: 80 Modality: individual Related Interventions 1. Facilitate and reinforce the client's shift from biased depressive self-talk and beliefs to realitybased cognitive messages that enhance self-confidence and increase adaptive actions (see  Positive Self-Talk in the Adult Psychotherapy Homework Planner by Jenniffer). Objective Describe current and past experiences with depression including their impact on functioning and  attempts to resolve it. Target Date: 12/11/2024 Frequency: biWeekly Progress: 60 Modality: individual  Related Interventions 1. Encourage the client to share his/her thoughts and feelings of depression; express empathy and  build rapport while identifying primary cognitive, behavioral, interpersonal, or other  contributors to depression. 2. Recognize, accept, and cope with feelings of depression. Diagnosis F43.23  Adjustment Disorder with mixed anxiety and depression  Major Depressive disorder, recurrent, moderate Conditions For Discharge Achievement of treatment goals and objectives  Orphia Mctigue G Kanae Ignatowski, LCSW                   "

## 2024-01-24 ENCOUNTER — Other Ambulatory Visit

## 2024-01-24 ENCOUNTER — Encounter: Payer: Self-pay | Admitting: Internal Medicine

## 2024-01-24 ENCOUNTER — Ambulatory Visit: Admitting: Internal Medicine

## 2024-01-24 VITALS — BP 120/80 | Ht 61.0 in | Wt 203.0 lb

## 2024-01-24 DIAGNOSIS — Z7984 Long term (current) use of oral hypoglycemic drugs: Secondary | ICD-10-CM | POA: Diagnosis not present

## 2024-01-24 DIAGNOSIS — E785 Hyperlipidemia, unspecified: Secondary | ICD-10-CM | POA: Diagnosis not present

## 2024-01-24 DIAGNOSIS — E1169 Type 2 diabetes mellitus with other specified complication: Secondary | ICD-10-CM | POA: Diagnosis not present

## 2024-01-24 DIAGNOSIS — Z794 Long term (current) use of insulin: Secondary | ICD-10-CM | POA: Diagnosis not present

## 2024-01-24 DIAGNOSIS — E039 Hypothyroidism, unspecified: Secondary | ICD-10-CM | POA: Diagnosis not present

## 2024-01-24 LAB — POCT GLYCOSYLATED HEMOGLOBIN (HGB A1C): Hemoglobin A1C: 7.9 % — AB (ref 4.0–5.6)

## 2024-01-24 NOTE — Progress Notes (Signed)
 " Name: Krista Dawson  MRN/ DOB: [id number], 08/21/1965   Age/ Sex: 59 y.o., female    PCP: Theophilus Andrews, Tully GRADE, MD   Reason for Endocrinology Evaluation: Type 2 Diabetes Mellitus     Date of Initial Endocrinology Visit: 11/03/2021    PATIENT IDENTIFIER: Krista Dawson is a 59 y.o. female with a past medical history of HTN, NASH, Hx pancreatitis, and hypothyroidism. The patient presented for initial endocrinology clinic visit on 11/03/2021  for consultative assistance with her diabetes management.    HPI: Krista Dawson was    Diagnosed with DM 2005 Prior Medications tried/Intolerance: trulicity  1.5 mg  - abdominal pain with elevated lipase but no pancreatitis diagnosis . Insulin  started 08/2021          Hemoglobin A1c has ranged from 6.0% in 2022, peaking at 8.6% in 2020.  The patient was on Trulicity  until June 2022 when she developed severe abdominal pain, her lipase has been consistently elevated but there was no evidence of pancreatitis Due to persistent elevation in lipase she was seen by [facility]    Has family history of pancreatic cancer  Both parents with thyroid   disease    On her initial visit to our clinic she had an A1c of 7.9% she was on basal insulin , metformin , and Farxiga  which we adjusted  GAD-65 and islet cell antibody were undetectable   Started glipizide  and Topamax  for weight loss 09/2022 with an A1c of 8.4%  Due to her inability to take glipizide  twice daily I switched her to glimepiride  01/2023 , with an A1c of 8.4%  Topamax  caused itching and rash  SUBJECTIVE:   During the last visit (09/25/2023): A1c 8.0%     Today (01/24/24): Krista Dawson is here for follow-up on diabetes management.  She checks her blood sugars multiple times daily. The patient has not had hypoglycemic episodes since the last clinic visit.  She continues to follow with GI for [facility] ([facility]), she also follows with [facility]  She continues to follow up with behavioral health for anxiety, she also follows with psychiatry, on Wellbuterin   Patient has been noted weight loss No nausea  No constipation    HOME DIABETES REGIMEN: Metformin  500 mg XR  2 tabs BID  Glimepiride  4 mg daily  Farxiga  10 mg daily  Toujeo  12 units daily daily    Statin: yes ACE-I/ARB: yes    CONTINUOUS GLUCOSE MONITORING RECORD INTERPRETATION                          DIABETIC COMPLICATIONS: Microvascular complications:  cataract Denies: CKD , retinopathy, neuropathy  Last eye exam: Completed 05/2021  Macrovascular complications:   Denies: CAD, PVD, CVA   PAST HISTORY: Past Medical History:  Past Medical History:  Diagnosis Date   Allergy    Asthma    exercise induced - not used inhaler couple of yrs pt reported 10-03-2018   Blood transfusion without reported diagnosis    Colon polyp    Complication of anesthesia    went into Vtach after breast reduction surgery 1988/89   Depression    DM (diabetes mellitus) (HCC)    GERD (gastroesophageal reflux disease)    HTN (hypertension)    Hyperlipidemia    Hypothyroidism    NASH (nonalcoholic steatohepatitis)    Obesity (BMI 30.0-34.9)    Post menopausal problems    Sleep apnea    no c-pap   Transaminitis  Vitamin D  deficiency    Past Surgical History:  Past Surgical History:  Procedure Laterality Date   ADRENALECTOMY Right    APPENDECTOMY     BIOPSY  12/12/2020   Procedure: BIOPSY;  Surgeon: Wilhelmenia Aloha Raddle., MD;  Location: [facility];  Service: Gastroenterology;;   BREAST REDUCTION SURGERY     CHOLECYSTECTOMY     ESOPHAGEAL MANOMETRY N/A 12/05/2022   Procedure: ESOPHAGEAL MANOMETRY (EM);  Surgeon: Albertus Gordy HERO, MD;  Location: [facility];  Service: Gastroenterology;  Laterality: N/A;   ESOPHAGOGASTRODUODENOSCOPY N/A 12/12/2020   Procedure: ESOPHAGOGASTRODUODENOSCOPY (EGD);  Surgeon: Wilhelmenia Aloha Raddle., MD;   Location: [facility];  Service: Gastroenterology;  Laterality: N/A;   EUS N/A 12/12/2020   Procedure: UPPER ENDOSCOPIC ULTRASOUND (EUS) LINEAR;  Surgeon: Wilhelmenia Aloha Raddle., MD;  Location: [facility];  Service: Gastroenterology;  Laterality: N/A;   HYSTERECTOMY ABDOMINAL WITH SALPINGECTOMY     LIVER BIOPSY  2008, 2013   NASH   TONSILLECTOMY     UPPER GASTROINTESTINAL ENDOSCOPY      Social History:  reports that she quit smoking about 17 years ago. Her smoking use included cigarettes. She has never used smokeless tobacco. She reports current alcohol use. She reports that she does not use drugs. Family History:  Family History  Problem Relation Age of Onset   Depression Mother    Breast cancer Mother    Colon polyps Mother    Stroke Mother 20   GER disease Father    Diabetes Father    Kidney disease Father    CAD Father    Other Father        cause of death listed as resp. failure   Stroke Sister 21   Colon polyps Sister    Pancreatic cancer Maternal Uncle    Pancreatic cancer Paternal Uncle    Diabetes Maternal Grandfather    Heart disease Maternal Grandfather    Depression Maternal Grandmother    CAD Paternal Grandfather    Diabetes Paternal Grandmother    Colon cancer Neg Hx    Esophageal cancer Neg Hx    Stomach cancer Neg Hx    Rectal cancer Neg Hx      HOME MEDICATIONS: Allergies as of 01/24/2024       Reactions   Trulicity  [dulaglutide ]    Pancreatitis   Spironolactone Hives, Swelling   Paxil [paroxetine Hcl] Other (See Comments)   Other reaction(s): Other (comments) WORSENED DEPRESSION. WORSENED DEPRESSION.   Sulfa Antibiotics Hives, Itching   Codeine Itching, Rash        Medication List        Accurate as of January 24, 2024 11:42 AM. If you have any questions, ask your nurse or doctor.          ALPRAZolam  0.5 MG tablet Commonly known as: Xanax  Take 0.5-1 tablets (0.25-0.5 mg total) by mouth 3 (three) times daily as needed for  anxiety.   aspirin EC 81 MG tablet Take 81 mg by mouth every evening.   buPROPion  300 MG 24 hr tablet Commonly known as: Wellbutrin  XL Take 1 tablet (300 mg total) by mouth daily.   CAL-MAG PO Take by mouth.   Contour Next Test test strip Generic drug: glucose blood Check blood sugar 1 time daily   cyclobenzaprine  10 MG tablet Commonly known as: FLEXERIL  Take 1 tablet twice a day by oral route as needed for 10 days.   Dexcom G7 Sensor Misc CHANGE SENSORS EVERY 10    DAYS   Farxiga  10  MG Tabs tablet Generic drug: dapagliflozin  propanediol TAKE 1 TABLET DAILY   glimepiride  4 MG tablet Commonly known as: AMARYL  Take 1 tablet (4 mg total) by mouth daily before breakfast.   hydrochlorothiazide  25 MG tablet Commonly known as: HYDRODIURIL  TAKE 1 TABLET DAILY   Insulin  Pen Needle 32G X 4 MM Misc 1 Device by Does not apply route daily in the afternoon.   lisinopril  20 MG tablet Commonly known as: ZESTRIL  TAKE 1 TABLET DAILY   magnesium chloride 64 MG Tbec SR tablet Commonly known as: SLOW-MAG Take 1 tablet by mouth daily.   Melatonin 10 MG Caps Take 10 mg by mouth at bedtime.   metFORMIN  500 MG 24 hr tablet Commonly known as: GLUCOPHAGE -XR Take 2 tablets (1,000 mg total) by mouth 2 (two) times daily with a meal.   Microlet Lancets Misc by Does not apply route. Test 3-4 times daily (Micorlet Colored Lancets)   OMEGA 3 500 PO Take 1,000 mg by mouth.   pantoprazole  40 MG tablet Commonly known as: PROTONIX  Take 1 tablet (40 mg total) by mouth 2 (two) times daily before a meal.   Rezdiffra 80 MG Tabs Generic drug: Resmetirom Take 1 tablet by mouth daily.   rosuvastatin  10 MG tablet Commonly known as: CRESTOR  TAKE 1 TABLET DAILY   Synthroid  88 MCG tablet Generic drug: levothyroxine  TAKE 1 TABLET DAILY BEFORE BREAKFAST   Toujeo  Max SoloStar 300 UNIT/ML Solostar Pen Generic drug: insulin  glargine (2 Unit Dial) Inject 12 Units into the skin at bedtime.    venlafaxine  XR 150 MG 24 hr capsule Commonly known as: EFFEXOR -XR Take 1 capsule (150 mg total) by mouth daily with breakfast.   Vitamin D3 50 MCG (2000 UT) Tabs Take 2,000 Units by mouth in the morning.         ALLERGIES: Allergies  Allergen Reactions   Trulicity  [Dulaglutide ]     Pancreatitis    Spironolactone Hives and Swelling   Paxil [Paroxetine Hcl] Other (See Comments)    Other reaction(s): Other (comments) WORSENED DEPRESSION. WORSENED DEPRESSION.    Sulfa Antibiotics Hives and Itching   Codeine Itching and Rash     REVIEW OF SYSTEMS: A comprehensive ROS was conducted with the patient and is negative except as per HPI    OBJECTIVE:   VITAL SIGNS: BP 120/80   Ht 5' 1 (1.549 m)   Wt 203 lb (92.1 kg)   BMI 38.36 kg/m    PHYSICAL EXAM:  General: Pt appears well and is in NAD  Neck:  Thyroid : Thyroid  size normal.  No goiter or nodules appreciated.   Lungs: Clear with good BS bilat   Heart: RRR   Extremities:  Lower extremities - No pretibial edema.   Neuro: MS is good with appropriate affect, pt is alert and Ox3    DM foot exam: 09/25/2023  The skin of the feet is intact without sores or ulcerations. The pedal pulses are 2+ on right and 2+ on left. The sensation is intact to a screening 5.07, 10 gram monofilament bilaterally     DATA REVIEWED:  Lab Results  Component Value Date   HGBA1C 8.0 (A) 09/25/2023   HGBA1C 8.3 (A) 05/06/2023   HGBA1C 8.4 (A) 01/29/2023    Latest Reference Range & Units 01/24/24 12:06  TSH 0.40 - 4.50 mIU/L 1.22    Latest Reference Range & Units 01/24/24 12:06  Microalb, Ur mg/dL <9.7  MICROALB/CREAT RATIO <30 mg/g creat NOTE  Creatinine, Urine 20 - 275 mg/dL 47  Labs @ careeverywhere 07/15/2023  BUN 27 Creatinine 1.1 Glucose 162 GFR 59 LDL 66 HDL 43 Triglycerides 140      ASSESSMENT / PLAN / RECOMMENDATIONS:   1) Type 2 Diabetes Mellitus, With improving glycemic control, Without  complications  - Most recent A1c of 7.9%. Goal A1c < 7.0 %.     -A1c today 7.9%.  But in reviewing CGM download, the patient has made drastic changes over the past 30 days and if she continues the current trend she will eventually reach an A1c of <7.0% -Patient encouraged to continue with lifestyle changes and exercise -Patient not a candidate for GLP-1 agonist nor DPP 4 inhibitors due to history of pancreatitis while on Trulicity  -GAD-65 and islet cell antibody negative  - I have switched glipizide  to glimepiride  as she has not been taking glipizide  twice daily - No changes at this time  MEDICATIONS: Continue 12 Toujeo  12 units daily Continue glimepiride  4 mg, 1 tablet daily Continue metformin  500 mg XR 2 tabs BID Continue Farxiga  10 mg daily  EDUCATION / INSTRUCTIONS: BG monitoring instructions: Patient is instructed to check her blood sugars  3 times a day, before meals . Call [facility] if: BG persistently < 70  I reviewed the Rule of 15 for the treatment of hypoglycemia in detail with the patient. Literature supplied.   2) Diabetic complications:  Eye: Does not have known diabetic retinopathy.  Neuro/ Feet: Does not have known diabetic peripheral neuropathy. Renal: Patient does not have known baseline CKD. She is  on an ACEI/ARB at present.   3) Hypothyroidism:   - Patient is clinically euthyroid - TSH today remains within normal range  Medication Continue levothyroxine  88 mcg daily  4) Dyslipidemia:  -LDL has been at goal - No changes   Medication  Continue rosuvastatin  20 mg daily    Follow-up in 4 months   Signed electronically by: Stefano Redgie Butts, MD  [facility]  [facility] [address] [place], [place] [address] Phone: [phone] FAX: [phone]   CC: Theophilus Andrews, Tully GRADE, MD [address] [place] [address] Phone: [phone]  Fax: [phone]    Return to  Endocrinology clinic as below: Future Appointments  Date Time Provider Department Center  02/03/2024  8:30 AM [facility] PREVISIT RM 53 [facility]  02/03/2024  4:00 PM Rhys Verneita DASEN, PA-C [facility] None  02/07/2024 10:00 AM Cottle, Peggye MATSU, LCSW [facility] None  02/21/2024  8:00 AM Mansouraty, Aloha Raddle., MD [facility]    "

## 2024-01-24 NOTE — Patient Instructions (Signed)
 Continue Toujeo  12 units daily  Continue Metformin  500 mg XR 2 tablets twice daily  Continue Farxiga  10 mg daily  Continue  Glimepiride  4 mg, before breakfast   HOW TO TREAT LOW BLOOD SUGARS (Blood sugar LESS THAN 70 MG/DL) Please follow the RULE OF 15 for the treatment of hypoglycemia treatment (when your (blood sugars are less than 70 mg/dL)   STEP 1: Take 15 grams of carbohydrates when your blood sugar is low, which includes:  3-4 GLUCOSE TABS  OR 3-4 OZ OF JUICE OR REGULAR SODA OR ONE TUBE OF GLUCOSE GEL    STEP 2: RECHECK blood sugar in 15 MINUTES STEP 3: If your blood sugar is still low at the 15 minute recheck --> then, go back to STEP 1 and treat AGAIN with another 15 grams of carbohydrates.

## 2024-01-25 LAB — TSH: TSH: 1.22 m[IU]/L (ref 0.40–4.50)

## 2024-01-25 LAB — MICROALBUMIN / CREATININE URINE RATIO
Creatinine, Urine: 47 mg/dL (ref 20–275)
Microalb, Ur: <0.2 mg/dL

## 2024-01-27 ENCOUNTER — Ambulatory Visit: Payer: Self-pay | Admitting: Internal Medicine

## 2024-01-27 ENCOUNTER — Ambulatory Visit: Admitting: Internal Medicine

## 2024-01-27 MED ORDER — LEVOTHYROXINE SODIUM 88 MCG PO TABS: 88.0000 ug | ORAL_TABLET | Freq: Every day | ORAL | 3 refills | Status: AC

## 2024-02-03 ENCOUNTER — Encounter: Payer: Self-pay | Admitting: Physician Assistant

## 2024-02-03 ENCOUNTER — Encounter

## 2024-02-03 ENCOUNTER — Telehealth: Admitting: Physician Assistant

## 2024-02-03 DIAGNOSIS — F3341 Major depressive disorder, recurrent, in partial remission: Secondary | ICD-10-CM | POA: Diagnosis not present

## 2024-02-03 DIAGNOSIS — F411 Generalized anxiety disorder: Secondary | ICD-10-CM

## 2024-02-03 NOTE — Progress Notes (Signed)
 "     [facility]  Patient ID: Krista Dawson,  MRN: [id number]  PCP: Theophilus Andrews, Tully GRADE, MD  Date of Evaluation: 02/03/2024 Time spent:20 minutes  Chief Complaint:  Chief Complaint   Anxiety; Depression; Follow-up    Virtual Visit via Telehealth  I connected with patient by a video enabled telemedicine application  with their informed consent, and verified patient privacy and that I am speaking with the correct person using two identifiers.  I am private, in my home office and the patient is at home.  I discussed the limitations, risks, security and privacy concerns of performing an evaluation and management service by video and the availability of in person appointments. I also discussed with the patient that there may be a patient responsible charge related to this service. The patient expressed understanding and agreed to proceed.   I discussed the assessment and treatment plan with the patient. The patient was provided an opportunity to ask questions and all were answered. The patient agreed with the plan and demonstrated an understanding of the instructions.   The patient was advised to call back or seek an in-person evaluation if the symptoms worsen or if the condition fails to improve as anticipated.  I provided approximately 20  minutes of non-face-to-face time during this encounter.  HISTORY/CURRENT STATUS: HPI For routine med check.  Krista Dawson is doing much better since we increased the Wellbutrin  at the LOV. Has more energy and motivation, and feels more like herself. Wants to do things she used to do. Work is going well.   No extreme sadness, tearfulness, or feelings of hopelessness.  Sleeps ok.  ADLs and personal hygiene are normal.  Focus and attention are better.  No change in memory.  Appetite has not changed.  Weight is stable.  Anxiety is well-controlled.  She occas takes the Xanax  and it is effective when needed.  No mania, delirium, AH/VH.  No  SI/HI.  Individual Medical History/ Review of Systems: Changes? :No    Past medications for mental health diagnoses include: Paxil, Prozac, Wellbutrin , lexapro, Effexor  started in early 2019 after hysterectomy for hot flashes and mood Ambien, Xanax   No mental health hospitalizations No suicide attempts Allergies: Trulicity  [dulaglutide ], Spironolactone, Paxil [paroxetine hcl], Sulfa antibiotics, and Codeine  Current Medications:  Current Outpatient Medications:    ALPRAZolam  (XANAX ) 0.5 MG tablet, Take 0.5-1 tablets (0.25-0.5 mg total) by mouth 3 (three) times daily as needed for anxiety., Disp: 60 tablet, Rfl: 1   aspirin EC 81 MG tablet, Take 81 mg by mouth every evening., Disp: , Rfl:    buPROPion  (WELLBUTRIN  XL) 300 MG 24 hr tablet, Take 1 tablet (300 mg total) by mouth daily., Disp: 90 tablet, Rfl: 1   Cholecalciferol (VITAMIN D3) 50 MCG (2000 UT) TABS, Take 2,000 Units by mouth in the morning., Disp: , Rfl:    dapagliflozin  propanediol (FARXIGA ) 10 MG TABS tablet, TAKE 1 TABLET DAILY, Disp: 90 tablet, Rfl: 3   glimepiride  (AMARYL ) 4 MG tablet, Take 1 tablet (4 mg total) by mouth daily before breakfast., Disp: 90 tablet, Rfl: 3   hydrochlorothiazide  (HYDRODIURIL ) 25 MG tablet, TAKE 1 TABLET DAILY, Disp: 90 tablet, Rfl: 1   insulin  glargine, 2 Unit Dial, (TOUJEO  MAX SOLOSTAR) 300 UNIT/ML Solostar Pen, Inject 12 Units into the skin at bedtime., Disp: 15 mL, Rfl: 3   Insulin  Pen Needle 32G X 4 MM MISC, 1 Device by Does not apply route daily in the afternoon., Disp: 100 each, Rfl: 4  levothyroxine  (SYNTHROID ) 88 MCG tablet, Take 1 tablet (88 mcg total) by mouth daily before breakfast., Disp: 90 tablet, Rfl: 3   lisinopril  (ZESTRIL ) 20 MG tablet, TAKE 1 TABLET DAILY, Disp: 90 tablet, Rfl: 1   magnesium chloride (SLOW-MAG) 64 MG TBEC SR tablet, Take 1 tablet by mouth daily., Disp: , Rfl:    Melatonin 10 MG CAPS, Take 10 mg by mouth at bedtime., Disp: , Rfl:    metFORMIN  (GLUCOPHAGE -XR) 500  MG 24 hr tablet, Take 2 tablets (1,000 mg total) by mouth 2 (two) times daily with a meal., Disp: 360 tablet, Rfl: 3   Microlet Lancets MISC, by Does not apply route. Test 3-4 times daily (Micorlet Colored Lancets), Disp: , Rfl:    Omega-3 Fatty Acids (OMEGA 3 500 PO), Take 1,000 mg by mouth., Disp: , Rfl:    pantoprazole  (PROTONIX ) 40 MG tablet, Take 1 tablet (40 mg total) by mouth 2 (two) times daily before a meal., Disp: 180 tablet, Rfl: 3   REZDIFFRA 80 MG TABS, Take 1 tablet by mouth daily., Disp: , Rfl:    rosuvastatin  (CRESTOR ) 10 MG tablet, TAKE 1 TABLET DAILY, Disp: 90 tablet, Rfl: 1   venlafaxine  XR (EFFEXOR -XR) 150 MG 24 hr capsule, Take 1 capsule (150 mg total) by mouth daily with breakfast., Disp: 90 capsule, Rfl: 1   Calcium -Magnesium (CAL-MAG PO), Take by mouth., Disp: , Rfl:    Continuous Glucose Sensor (DEXCOM G7 SENSOR) MISC, CHANGE SENSORS EVERY 10    DAYS, Disp: 9 each, Rfl: 3   cyclobenzaprine  (FLEXERIL ) 10 MG tablet, Take 1 tablet twice a day by oral route as needed for 10 days., Disp: , Rfl:    glucose blood (CONTOUR NEXT TEST) test strip, Check blood sugar 1 time daily, Disp: 100 each, Rfl: 12 Medication Side Effects: none  Family Medical/ Social History: Changes? No  MENTAL HEALTH EXAM:  There were no vitals taken for this visit.There is no height or weight on file to calculate BMI.  General Appearance: Casual and Well Groomed  Eye Contact:  Good  Speech:  Clear and Coherent and Normal Rate  Volume:  Normal  Mood:  Euthymic  Affect:  Congruent  Thought Process:  Goal Directed and Descriptions of Associations: Circumstantial  Orientation:  Full (Time, Place, and Person)  Thought Content: Logical   Suicidal Thoughts:  No  Homicidal Thoughts:  No  Memory:  WNL  Judgement:  Good  Insight:  Good  Psychomotor Activity:  Normal  Concentration:  Concentration: Good and Attention Span: Good  Recall:  Good  Fund of Knowledge: Good  Language: Good  Assets:   Communication Skills Desire for Improvement Financial Resources/Insurance Housing Resilience Transportation Vocational/Educational  ADL's:  Intact  Cognition: WNL  Prognosis:  Good   DIAGNOSES:    ICD-10-CM   1. Recurrent major depression in partial remission  F33.41     2. Generalized anxiety disorder  F41.1       Receiving Psychotherapy: Yes   with Bambi Cottle, LCSW.  Krista Dawson is 'graduating' from therapy soon. Bambi is retiring in March but will have someone in place if Krista Dawson needs to restart therapy.   RECOMMENDATIONS:   PDMP reviewed.  Xanax  filled 10/29/2023. I provided approximately  20 minutes of non-face-to-face time during this encounter, including time spent before and after the visit in records review, medical decision making, counseling pertinent to today's visit, and charting.   I'm glad to see her doing better! No changes are needed.   Continue Xanax   0.5 mg, 1/2-1 p.o. 3 times daily as needed anxiety. Continue  Wellbutrin  XL 300 mg, 1 q am.  Continue Effexor  XR 150 mg daily.  Return in  3 months.   Verneita Cooks, PA-C  "

## 2024-02-07 ENCOUNTER — Ambulatory Visit: Admitting: Psychology

## 2024-02-09 ENCOUNTER — Other Ambulatory Visit: Payer: Self-pay | Admitting: Internal Medicine

## 2024-02-13 ENCOUNTER — Ambulatory Visit: Admitting: Psychology

## 2024-02-13 DIAGNOSIS — F3341 Major depressive disorder, recurrent, in partial remission: Secondary | ICD-10-CM

## 2024-02-13 DIAGNOSIS — F4323 Adjustment disorder with mixed anxiety and depressed mood: Secondary | ICD-10-CM | POA: Diagnosis not present

## 2024-02-13 DIAGNOSIS — F411 Generalized anxiety disorder: Secondary | ICD-10-CM | POA: Diagnosis not present

## 2024-02-13 NOTE — Progress Notes (Signed)
 " [facility] Counselor/Therapist Progress Note  Patient ID: Krista Dawson, MRN: [id number],    Date:02/13/2024  Time Spent: 54 minutes  Time In:  9:06 Time Out:  10:00  Treatment Type: Individual Therapy  Reported Symptoms: sadness,anxiety  Mental Status Exam: Appearance:  Casual     Behavior: Appropriate  Motor: Normal  Speech/Language:  Clear and Coherent  Affect: Blunt  Mood: pleasant  Thought process: normal  Thought content:   WNL  Sensory/Perceptual disturbances:   WNL  Orientation: oriented to person, place, time/date, and situation  Attention: Good  Concentration: Good  Memory: WNL  Fund of knowledge:  Good  Insight:   Good  Judgment:  Good  Impulse Control: Good   Risk Assessment: Danger to Self:  No Self-injurious Behavior: No Danger to Others: No Duty to Warn:no Physical Aggression / Violence:No  Access to Firearms a concern: No  Gang Involvement:No   Subjective: The patient attended a face-to-face individual therapy session in the office today.  The patient presents as pleasant and cooperative.  The patient reports that she has been doing better since we did the EMDR and she states that the work environment continues to sometimes be toxic but she is not as reactive and she seems to be doing a better job of being able to stand up for herself in that situation.  I do believe that she realizes that she was contributing somewhat to the toxicity of the environment but she has since been able to separated out and justify what she has done in regard to doing her job.  The patient continues to do well and she has made a great deal of progress since coming to therapy.  She did request that we continue to meet at least every 3 weeks until I retire so that she can just make sure that she has her coping skills solid..   Interventions: Cognitive Behavioral Therapy, Assertiveness/Communication, and Insight-Oriented, EMDR  Diagnosis:Recurrent major depression  in partial remission  Generalized anxiety disorder  Adjustment disorder with mixed anxiety and depressed mood  Plan: Client Abilities/Strengths  Intelligent, caring, insightful  Client Treatment Preferences  Outpatient individual therapy  Client Statement of Needs  I need help to deal with all of the stressors going on that are contributing to my depression  Treatment Level  Outpatient Individual therapy  Symptoms  Decrease or loss of appetite.: No Description Entered (Status: maintained). Depressed or irritable  mood.: No Description Entered (Status: maintained). Lack of energy.: No Description Entered (Status:  maintained).  Problems Addressed  Unipolar Depression, Unipolar Depression  Goals 1. Develop healthy thinking patterns and beliefs about self, others, and the world that lead to the alleviation and help prevent the relapse of  depression. Objective Learn and implement behavioral strategies to overcome depression. Target Date: 12/11/2024 Frequency: biWeekly Progress: 90 Modality: individual  Related Interventions 1. Assist the client in developing skills that increase the likelihood of deriving pleasure from  behavioral activation (e.g., assertiveness skills, developing an exercise plan, less internal/more  external focus, increased social involvement); reinforce success. Objective Learn and implement problem-solving and decision-making skills. Target Date: 12/04/2026Frequency: biWeekly Progress: 80 Modality: individual Objective Verbalize an understanding and resolution of current interpersonal problems. Target Date: 12/11/2024 Frequency: biWeekly Progress: 90 Modality: individual Related Interventions 1. For interpersonal disputes, help the client explore the relationship, the nature of the dispute,  whether it has reached an impasse, and available options to resolve it including learning and  implementing conflict-resolution skills; if the relationship has  reached an impasse, consider  ways to change the impasse or to end the relationship. Objective Identify and replace thoughts and beliefs that support depression. Target Date: 12/11/2024 Frequency: biWeekly Progress: 90 Modality: individual Related Interventions 1. Facilitate and reinforce the client's shift from biased depressive self-talk and beliefs to realitybased cognitive messages that enhance self-confidence and increase adaptive actions (see  Positive Self-Talk in the Adult Psychotherapy Homework Planner by Jenniffer). Objective Describe current and past experiences with depression including their impact on functioning and  attempts to resolve it. Target Date: 12/11/2024 Frequency: biWeekly Progress: 90 Modality: individual  Related Interventions 1. Encourage the client to share his/her thoughts and feelings of depression; express empathy and  build rapport while identifying primary cognitive, behavioral, interpersonal, or other  contributors to depression. 2. Recognize, accept, and cope with feelings of depression. Diagnosis F43.23  Adjustment Disorder with mixed anxiety and depression  Major Depressive disorder, recurrent, moderate Conditions For Discharge Achievement of treatment goals and objectives  Krista Dawson G Krista Kinsella, LCSW                   "

## 2024-02-21 ENCOUNTER — Encounter: Admitting: Gastroenterology

## 2024-03-06 ENCOUNTER — Ambulatory Visit: Admitting: Psychology

## 2024-03-27 ENCOUNTER — Ambulatory Visit: Admitting: Psychology

## 2024-05-18 ENCOUNTER — Ambulatory Visit: Admitting: Internal Medicine
